# Patient Record
Sex: Female | Born: 1955
Health system: Southern US, Community
[De-identification: ages and names within clinical notes are randomized; demographics above are authoritative.]

## PROBLEM LIST (undated history)

## (undated) DIAGNOSIS — K219 Gastro-esophageal reflux disease without esophagitis: Secondary | ICD-10-CM

## (undated) DIAGNOSIS — G4733 Obstructive sleep apnea (adult) (pediatric): Secondary | ICD-10-CM

## (undated) DIAGNOSIS — K5790 Diverticulosis of intestine, part unspecified, without perforation or abscess without bleeding: Secondary | ICD-10-CM

## (undated) DIAGNOSIS — R112 Nausea with vomiting, unspecified: Secondary | ICD-10-CM

## (undated) DIAGNOSIS — I2510 Atherosclerotic heart disease of native coronary artery without angina pectoris: Secondary | ICD-10-CM

## (undated) DIAGNOSIS — Z9889 Other specified postprocedural states: Secondary | ICD-10-CM

## (undated) DIAGNOSIS — E785 Hyperlipidemia, unspecified: Secondary | ICD-10-CM

## (undated) DIAGNOSIS — I219 Acute myocardial infarction, unspecified: Secondary | ICD-10-CM

## (undated) DIAGNOSIS — M75110 Incomplete rotator cuff tear or rupture of unspecified shoulder, not specified as traumatic: Secondary | ICD-10-CM

## (undated) DIAGNOSIS — I739 Peripheral vascular disease, unspecified: Secondary | ICD-10-CM

## (undated) DIAGNOSIS — Z972 Presence of dental prosthetic device (complete) (partial): Secondary | ICD-10-CM

## (undated) DIAGNOSIS — C801 Malignant (primary) neoplasm, unspecified: Secondary | ICD-10-CM

## (undated) DIAGNOSIS — E7800 Pure hypercholesterolemia, unspecified: Secondary | ICD-10-CM

## (undated) DIAGNOSIS — R011 Cardiac murmur, unspecified: Secondary | ICD-10-CM

## (undated) DIAGNOSIS — E119 Type 2 diabetes mellitus without complications: Secondary | ICD-10-CM

## (undated) DIAGNOSIS — M1990 Unspecified osteoarthritis, unspecified site: Secondary | ICD-10-CM

## (undated) DIAGNOSIS — I350 Nonrheumatic aortic (valve) stenosis: Secondary | ICD-10-CM

## (undated) DIAGNOSIS — J45909 Unspecified asthma, uncomplicated: Secondary | ICD-10-CM

## (undated) DIAGNOSIS — I4819 Other persistent atrial fibrillation: Secondary | ICD-10-CM

## (undated) DIAGNOSIS — I499 Cardiac arrhythmia, unspecified: Secondary | ICD-10-CM

## (undated) DIAGNOSIS — M810 Age-related osteoporosis without current pathological fracture: Secondary | ICD-10-CM

## (undated) DIAGNOSIS — E669 Obesity, unspecified: Secondary | ICD-10-CM

## (undated) DIAGNOSIS — Z973 Presence of spectacles and contact lenses: Secondary | ICD-10-CM

## (undated) DIAGNOSIS — R609 Edema, unspecified: Secondary | ICD-10-CM

## (undated) DIAGNOSIS — I10 Essential (primary) hypertension: Secondary | ICD-10-CM

## (undated) HISTORY — DX: Obesity, unspecified: E66.9

## (undated) HISTORY — DX: Incomplete rotator cuff tear or rupture of unspecified shoulder, not specified as traumatic: M75.110

## (undated) HISTORY — DX: Other persistent atrial fibrillation: I48.19

## (undated) HISTORY — PX: CHOLECYSTECTOMY: SHX55

## (undated) HISTORY — DX: Gastro-esophageal reflux disease without esophagitis: K21.9

## (undated) HISTORY — DX: Malignant (primary) neoplasm, unspecified: C80.1

## (undated) HISTORY — PX: APPENDECTOMY: SHX54

## (undated) MED FILL — Ferumoxytol Inj 510 MG/17ML (30 MG/ML) (Elemental Fe): INTRAVENOUS | Qty: 17 | Status: AC

---
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
## 1996-02-12 HISTORY — PX: NECK SURGERY: SHX720

## 1997-02-11 HISTORY — PX: SHOULDER SURGERY: SHX246

## 1998-11-05 ENCOUNTER — Encounter: Payer: Self-pay | Admitting: Neurosurgery

## 1998-11-05 ENCOUNTER — Ambulatory Visit (HOSPITAL_COMMUNITY): Admission: RE | Admit: 1998-11-05 | Discharge: 1998-11-05 | Payer: Self-pay | Admitting: Neurosurgery

## 1999-02-12 HISTORY — PX: CARPAL TUNNEL RELEASE: SHX101

## 2000-01-07 ENCOUNTER — Encounter: Admission: RE | Admit: 2000-01-07 | Discharge: 2000-01-07 | Payer: Self-pay | Admitting: Neurosurgery

## 2000-01-07 ENCOUNTER — Encounter: Payer: Self-pay | Admitting: Neurosurgery

## 2000-01-10 ENCOUNTER — Ambulatory Visit (HOSPITAL_BASED_OUTPATIENT_CLINIC_OR_DEPARTMENT_OTHER): Admission: RE | Admit: 2000-01-10 | Discharge: 2000-01-10 | Payer: Self-pay | Admitting: Orthopedic Surgery

## 2000-01-31 ENCOUNTER — Ambulatory Visit (HOSPITAL_BASED_OUTPATIENT_CLINIC_OR_DEPARTMENT_OTHER): Admission: RE | Admit: 2000-01-31 | Discharge: 2000-01-31 | Payer: Self-pay | Admitting: Orthopedic Surgery

## 2000-02-12 HISTORY — PX: ERCP: SHX60

## 2000-06-17 ENCOUNTER — Encounter: Payer: Self-pay | Admitting: Otolaryngology

## 2000-06-17 ENCOUNTER — Ambulatory Visit (HOSPITAL_COMMUNITY): Admission: RE | Admit: 2000-06-17 | Discharge: 2000-06-17 | Payer: Self-pay | Admitting: Otolaryngology

## 2000-12-22 ENCOUNTER — Ambulatory Visit (HOSPITAL_COMMUNITY): Admission: RE | Admit: 2000-12-22 | Discharge: 2000-12-22 | Payer: Self-pay | Admitting: Internal Medicine

## 2001-01-07 ENCOUNTER — Encounter: Payer: Self-pay | Admitting: Internal Medicine

## 2001-01-07 ENCOUNTER — Ambulatory Visit (HOSPITAL_COMMUNITY): Admission: RE | Admit: 2001-01-07 | Discharge: 2001-01-07 | Payer: Self-pay | Admitting: Internal Medicine

## 2001-01-28 ENCOUNTER — Ambulatory Visit (HOSPITAL_COMMUNITY): Admission: RE | Admit: 2001-01-28 | Discharge: 2001-01-28 | Payer: Self-pay | Admitting: Internal Medicine

## 2001-10-08 ENCOUNTER — Ambulatory Visit (HOSPITAL_COMMUNITY): Admission: RE | Admit: 2001-10-08 | Discharge: 2001-10-08 | Payer: Self-pay | Admitting: Cardiology

## 2002-01-11 ENCOUNTER — Ambulatory Visit (HOSPITAL_COMMUNITY): Admission: RE | Admit: 2002-01-11 | Discharge: 2002-01-11 | Payer: Self-pay | Admitting: Internal Medicine

## 2002-01-11 ENCOUNTER — Encounter: Payer: Self-pay | Admitting: Internal Medicine

## 2002-02-11 HISTORY — PX: GASTRIC BYPASS: SHX52

## 2002-03-24 ENCOUNTER — Encounter: Admission: RE | Admit: 2002-03-24 | Discharge: 2002-06-22 | Payer: Self-pay | Admitting: Surgery

## 2002-03-25 ENCOUNTER — Encounter: Payer: Self-pay | Admitting: Surgery

## 2002-03-25 ENCOUNTER — Encounter: Admission: RE | Admit: 2002-03-25 | Discharge: 2002-03-25 | Payer: Self-pay | Admitting: Surgery

## 2002-06-22 ENCOUNTER — Inpatient Hospital Stay (HOSPITAL_COMMUNITY): Admission: RE | Admit: 2002-06-22 | Discharge: 2002-06-25 | Payer: Self-pay | Admitting: Surgery

## 2002-06-23 ENCOUNTER — Encounter: Payer: Self-pay | Admitting: Surgery

## 2002-07-06 ENCOUNTER — Encounter: Admission: RE | Admit: 2002-07-06 | Discharge: 2002-10-04 | Payer: Self-pay | Admitting: Surgery

## 2002-09-28 ENCOUNTER — Ambulatory Visit (HOSPITAL_COMMUNITY): Admission: RE | Admit: 2002-09-28 | Discharge: 2002-09-28 | Payer: Self-pay | Admitting: Internal Medicine

## 2002-09-28 ENCOUNTER — Encounter: Payer: Self-pay | Admitting: Internal Medicine

## 2002-12-23 ENCOUNTER — Encounter: Admission: RE | Admit: 2002-12-23 | Discharge: 2003-03-23 | Payer: Self-pay | Admitting: Surgery

## 2003-01-13 ENCOUNTER — Ambulatory Visit (HOSPITAL_COMMUNITY): Admission: RE | Admit: 2003-01-13 | Discharge: 2003-01-13 | Payer: Self-pay | Admitting: Internal Medicine

## 2003-03-31 ENCOUNTER — Encounter: Admission: RE | Admit: 2003-03-31 | Discharge: 2003-06-29 | Payer: Self-pay | Admitting: Surgery

## 2003-08-03 ENCOUNTER — Encounter: Admission: RE | Admit: 2003-08-03 | Discharge: 2003-11-01 | Payer: Self-pay | Admitting: Surgery

## 2004-02-22 ENCOUNTER — Ambulatory Visit (HOSPITAL_COMMUNITY): Admission: RE | Admit: 2004-02-22 | Discharge: 2004-02-22 | Payer: Self-pay | Admitting: Internal Medicine

## 2004-07-13 ENCOUNTER — Ambulatory Visit (HOSPITAL_COMMUNITY): Admission: RE | Admit: 2004-07-13 | Discharge: 2004-07-13 | Payer: Self-pay | Admitting: Internal Medicine

## 2005-02-11 HISTORY — PX: COLONOSCOPY: SHX174

## 2005-03-08 ENCOUNTER — Ambulatory Visit (HOSPITAL_COMMUNITY): Admission: RE | Admit: 2005-03-08 | Discharge: 2005-03-08 | Payer: Self-pay | Admitting: Internal Medicine

## 2005-03-08 ENCOUNTER — Encounter: Payer: Self-pay | Admitting: Internal Medicine

## 2005-03-08 ENCOUNTER — Ambulatory Visit: Payer: Self-pay | Admitting: Internal Medicine

## 2005-09-16 ENCOUNTER — Ambulatory Visit (HOSPITAL_COMMUNITY): Admission: RE | Admit: 2005-09-16 | Discharge: 2005-09-16 | Payer: Self-pay | Admitting: Internal Medicine

## 2008-09-01 ENCOUNTER — Emergency Department (HOSPITAL_COMMUNITY): Admission: EM | Admit: 2008-09-01 | Discharge: 2008-09-01 | Payer: Self-pay | Admitting: Emergency Medicine

## 2010-06-29 NOTE — Procedures (Signed)
   NAMEMARELI, ANTUNES                       ACCOUNT NO.:  [id number]   MEDICAL RECORD NO.:  [id number]                   PATIENT TYPE:  OUT   LOCATION:  RAD                                  FACILITY:  [facility]   PHYSICIAN:  Gerrit Friends. Dietrich Pates, M.D. [facility]        DATE OF BIRTH:  Jun 10, 1955   DATE OF PROCEDURE:  10/08/2001  DATE OF DISCHARGE:                                  ECHOCARDIOGRAM   CLINICAL DATA:  A 55 year old woman with hypertension and peripheral edema.   1. Technically adequate echocardiographic study.  2. Slight left atrial enlargement; normal right atrium and right ventricle.  3. Normal aortic, mitral, tricuspid, and pulmonic valves.  4. Normal internal dimension of the left ventricle; normal regional and     global LV systolic function.  5. Normal Doppler study with physiologic tricuspid regurgitation and normal     estimated RV systolic pressure.  6. Normal IVC.                                                      Gerrit Friends. Dietrich Pates, M.D. [facility]    RMR/MEDQ  D:  10/09/2001  T:  10/09/2001  Job:  [id number]

## 2010-06-29 NOTE — Discharge Summary (Signed)
   [name], Amy Tran                       ACCOUNT NO.:  [id number]   MEDICAL RECORD NO.:  [id number]                   PATIENT TYPE:  INP   LOCATION:  0468                                 FACILITY:  [facility]   PHYSICIAN:  Thornton Park. Daphine Deutscher, M.D.             DATE OF BIRTH:  04-18-1955   DATE OF ADMISSION:  06/22/2002  DATE OF DISCHARGE:  06/25/2002                                 DISCHARGE SUMMARY   ADMISSION DIAGNOSIS:  Morbid obesity.   PROCEDURE:  Roux-en-Y gastric bypass (antecolic-split omentum), 40 cm bilo-  enteric limb with 108 cm Roux limb bypass.   COURSE IN THE HOSPITAL:  The patient is a 55 year old morbidly obese lady  who underwent the above-mentioned operation.  She had a swallow done on  postoperative day #1 which showed no evidence of leak.  She had a DVT study  which showed no evidence of deep vein thrombosis.  Her Jackson-Pratt drain  just showed some serosanguineous material.  She was begun on blue jello, and  she tolerated that well and was taking p.o.'s and was ready for discharge on  Jun 25, 2002, postoperative day #3.  She was ready to be followed up in the  office by myself and Osborne Casco in one week.   CONDITION:  Good.   FINAL DIAGNOSIS:  Morbid obesity.                                               Thornton Park Daphine Deutscher, M.D.    MBM/MEDQ  D:  07/06/2002  T:  07/06/2002  Job:  [id number]

## 2010-06-29 NOTE — Op Note (Signed)
[facility]  Patient:    Amy Tran, Amy Tran Visit Number: [id number] MRN: [id number]          Service Type: END Location: DAY Attending Physician:  Jonathon Bellows Dictated by:   Roetta Sessions, M.D. Proc. Date: 12/22/00 Admit Date:  12/22/2000   CC:         Carylon Perches, M.D.   Operative Report  PROCEDURE:  Esophagogastroduodenoscopy and Maloney dilation followed by biopsy, followed by colonoscopy and snare polypectomy.  ENDOSCOPIST:  Roetta Sessions, M.D.  INDICATIONS FOR PROCEDURE:  Patient is a 55 year old Caucasian female referred at the courtesy of Dr. Carylon Perches for further evaluation of esophageal dysphagia.  She also has intermittent hematochezia.  EGD and colonoscopy are now being done.  This approach has been discussed with Ms. Vice previously, potential risks, benefits and alternatives have been reviewed and questions answered.  She understands the potential risks, benefits and alternatives. Please see my handwritten H&P for more information.  The patient is low risk for conscious sedation with Versed and Demerol.  DESCRIPTION OF PROCEDURE:  O2 saturation, blood pressure, pulse and respirations were monitored throughout the entirety of both procedures. Conscious sedation:  IV Versed and Demerol in incremental doses.  INSTRUMENTS:  Olympus video chip gastroscope and colonoscope.  ESOPHAGOGASTRODUODENOSCOPY FINDINGS:  Examination of the tubular esophagus revealed multiple distal esophageal erosions.  There was no obvious ring, stricture or neoplasm.  There was no Barretts esophagus.  EG junction was easily traversed.  Stomach:  The gastric cavity was empty and insufflated well with air. Thorough examination of the gastric mucosa including a retroflexed view of the proximal stomach and esophagogastric junction demonstrated only antral erosions.  Pylorus was patent and easily traversed.  Duodenum:  Bulb and second portion appeared  normal.  THERAPEUTIC/DIAGNOSTIC MANEUVERS PERFORMED:  A 56-French Maloney dilator was passed to full insertion.  A look back into the esophagus revealed no apparent complications related to passage of the dilator.  Subsequently, two antral biopsies for CLOtesting were obtained.  The patient tolerated the procedure well and was prepared for colonoscopy. Digital rectal examination revealed no abnormalities.  ENDOSCOPIC FINDINGS:  The prep was adequate.  Rectum:  Examination of the rectal mucosa including a retroflexed view of the anal verge revealed only some internal hemorrhoids.  Colon:  Colonic mucosa was surveyed from the rectosigmoid junction through the left, transverse and right colon to the area of the appendiceal orifice, ileocecal valve and cecum.  Patient was noted to have a 5-mm polyp on a stalk at 25 cm.  Remainder of the colonic mucosa appeared normal.  From the level of the cecum and ileocecal valve and appendiceal orifice (see photos), the scope was slowly withdrawn and all previously mentioned mucosal surfaces were again seen.  No other abnormalities were observed.  The polyp at 25 cm was resected and recovered with snare cautery.  Patient tolerated both procedure well and was reactive at endoscopy.  IMPRESSION: Esophagogastroduodenoscopy: 1. Distal esophageal erosions consistent with mild erosive reflux esophagitis. 2. Antral erosions of uncertain significance, status post CLOtesting. 3. Remainder of upper gastrointestinal tract appeared normal, status post    passage of a 56-French Maloney dilator prior to biopsy.  Colonoscopy findings: 1. Internal hemorrhoids, otherwise, normal rectum. 2. Small 5-mm polyp on a stalk at 25 cm, resected. 3. Remainder of colonic mucosa appeared normal.  I suspect that she has bleed from hemorrhoids.  RECOMMENDATIONS: 1. Hemorrhoid literature given to Ms. Gugel. 2. Tentative course of Anusol-HC suppositories, one per  rectum at  bedtime. 3. No aspirin or arthritis medications for 10 days. 4. She is to go by my office to get some Aciphex.  I have asked her to start    taking Aciphex 20 mg orally daily 30 minutes before breakfast. 5. Will follow up on the pathology and make further recommendations. Dictated by:   Roetta Sessions, M.D. Attending Physician:  Jonathon Bellows DD:  12/22/00 TD:  12/23/00 Job: [id number]

## 2010-06-29 NOTE — Op Note (Signed)
NAMEDANE, BLOCH                       ACCOUNT NO.:  [id number]   MEDICAL RECORD NO.:  [id number]                   PATIENT TYPE:  INP   LOCATION:  0468                                 FACILITY:  [facility]   PHYSICIAN:  Thornton Park. Daphine Deutscher, M.D.             DATE OF BIRTH:  06-Feb-1956   DATE OF PROCEDURE:  06/22/2002  DATE OF DISCHARGE:                                 OPERATIVE REPORT   PREOPERATIVE DIAGNOSIS:  Morbid obesity with hypertension, arthritis and  borderline diabetes.   POSTOPERATIVE DIAGNOSIS:  Morbid obesity with hypertension, arthritis and  borderline diabetes and body mass index of 50.3.   OPERATION/PROCEDURE:  Roux-en-Y gastric bypass (antecolic Roux splitting the  omentum with a Roux length of 108 cm and biloenteric length of 40 cm from  the ligament of Treitz).   SURGEON:  Thornton Park. Daphine Deutscher, M.D.   ASSISTANT:  Sharlet Salina T. Hoxworth, M.D.   ANESTHESIA:  General endotracheal anesthesia.   DRAINS:  One Jackson-Pratt drain in the upper abdomen.   DESCRIPTION OF PROCEDURE:  Josslynn Mentzer is a 55 year old lady who  underwent preoperative evaluation and informed consent regarding gastric  bypass surgery.  She was brought to Room #1 and given general anesthesia.  The abdomen was marked with ribs and xiphoid and using the Endopath device,  a 0-degree scope was used to gain access into the left upper quadrant.  The  11/12 was used in this location.  The patient's abdomen was insufflated and  I found her to have omentum stuck up to the anterior abdominal wall from her  previous open cholecystectomy.  Another camera port was placed below and to  the right of the umbilicus.  With that in place, another port was placed in  the upper abdomen.  But before doing that, I used the right lower abdominal  11/22/10 port to insert the Harmonic scalpel and I harmonically scalpel  divided all the adhesions to the anterior abdominal wall.  Two ports were  placed in the upper  abdomen and two along the left side.  We began the  jejunojejunostomy portion of the operation.  Initially the omentum was grasped along the transverse colon and this was  elevated.  I walked the small bowel back and identified the ligament of  Treitz.  I then marched 40 cm distal to this and divided the small intestine  with a single application of the Ethicon endo-GIA using a vascular  cartridge.  These harmonicked to increase the size of this mesenteric  defect.  I then marked the Roux limb with a Penrose drain and keeping that  to the left upper quadrant, I then marched, distal to that point, a total of  108 cm and at that point sutured the end of the bilopancreatic limb to the  Roux limb.  The antimesenteric borders were scored with the Harmonic and the  endo-GIA was inserted and fired.  Anastomosis was created.  The common  defect was closed from both ends using running 2-0 Vicryl suture.  There was  one area that I did not tuck in toward the proximal end of the closure and I  oversewed that with a simple Lembert type suture, tying this down.  When  closure had been completed, I used some Tisseel to seal that anastomosis.  The mesenteric defect was closed with a running 2-0 silk.  That was done  after we eventually pulled the Roux up to make sure of how it was going to  be lying.  With the Roux limb going up, I felt I may have to divide the  omentum. We went ahead and placed up on the omentum to the stomach.  Next, a Nathanson retractor was inserted through a separate 5 mm in the  midline and the liver was elevated.  I worked proximally with some  difficulty, because she was quite high and obese, to dissect along the  cardia of the stomach and along the left crus.  I then went to the second  vessel along the lesser curvature, approximately 4 cm from the  esophagogastric junction, and divided the stomach, first with a horizontal  firing of the Auto-Suture stapling device.  This was  done after all the  tubes were removed.  The 40 cm was applied horizontally first and then three  applications of the 60, the first two getting up very close to the edge and  then the last to divide the last little tip.  This was done with the Ewald  tube in place and moving it around to make sure it did not get impinged.  At  that point I did apply some Tisseel to that area of the closure posteriorly.  We then went down and divided the omentum where the antecolic Roux would be  laying. I took it off a portion of the transverse colon and it kind of fell  away.  I was then able to bring it up and suture it to the second staple  line of the stomach, suturing with a running 2-0 Vicryl.  I started a second  one below and sutured it up to itself.  I actually created somewhat of a  prominent candy cane with the candy cane pointed to the patient's right.  We  had checked this and this was not twisted and seemed to be the way it would  lie.  I then opened along the antimesenteric border of the small bowel and  of the pouch with the Ewald tube in place using the Harmonic scalpel.  The  Ethicon stapler was inserted through this and fired and this created the  anastomosis.  Common defect was closed with two sutures of 2-0 Vicryl from  either staple line suturing this closed and tying in the middle.  The Ewald  tube was then advanced across the anastomosis and then the anterior row was  closed, tying first to the corner stitch to the patient's left and suturing  from left to right with a running Connell type suture which inverted the  staple line and created a second layer to the anastomosis.  Toward the edge  it was tied to the stitch at the right corner.  Dr. Johna Sheriff then performed endoscopy.  We visualized the anastomosis in  both limbs and a picture was taken.  Tisseel was then applied to the  anterior anastomosis.  I also took off a little excess of the candy cane to prevent  a little pouch from  being created.  Everything looked healthy and  viable.  When he endoscoped her, I clamped off the bowel and insufflated and  there was no evidence of leak.  This was done under saline.  The port sites  all looked good.  They were all placed obliquely.  We surveyed the bowel and  the rest of the abdomen.  No other abnormalities were noted.  The drain was  brought in through the left-sided 5 mm port and secured to the skin with a  nylon.  This was placed up underneath the liver.  The Nathanson retractor  was then withdrawn and the abdomen was deflated and the trocars were  removed.  The incisions were closed with 4-0 Vicryl, with staples also.  The  patient seemed to tolerate the procedure well and was taken to the recovery  room in satisfactory condition.   FINAL DIAGNOSIS:  Morbid obesity, body mass index slightly greater than 50,  status post Roux-en-Y gastric bypass.                                               Thornton Park Daphine Deutscher, M.D.    MBM/MEDQ  D:  06/22/2002  T:  06/23/2002  Job:  [id number]   cc:   Kingsley Callander. Ouida Sills, M.D.  [address]  [place]  [place] [address]  Fax: [phone]

## 2010-06-29 NOTE — Op Note (Signed)
[facility]  Patient:    Amy Tran, Amy Tran                    MRN: [id number] Proc. Date: 01/31/00 Adm. Date:  [id number] Attending:  Colbert Ewing                           Operative Report  PREOPERATIVE DIAGNOSIS:  Left carpal tunnel syndrome.  POSTOPERATIVE DIAGNOSIS:  Left carpal tunnel syndrome.  OPERATION PERFORMED:  Left carpal tunnel release.  SURGEON:  Loreta Ave, M.D.  ASSISTANT:  Arlys John D. Petrarca, P.A.-C.  ANESTHESIA:  IV regional.  SPECIMENS:  None.  COMPLICATIONS:  None.  CULTURES:  None.  DRESSING:  Soft compressive with bulky hand dressing and splint.  DESCRIPTION OF PROCEDURE:  The patient was brought to the operating room and placed on the operating table in the supine position.  After adequate anesthesia had been obtained, the left arm was prepped and draped in the usual sterile fashion.  A curved incision along the thenar eminence heading slightly ulnarward at the distal wrist crease.  Skin and subcutaneous tissues divided avoiding injury to the palmar branch median nerve.  Flexor retinaculum over the carpal tunnel exposed and incised in its entirety from the forearm fascia proximally to the palmar arch distally.  Moderate to marked hourglass distally.  Moderate to marked hourglass constriction and erythema of the median nerve right in the middle of the carpal canal.  Completely decompressed.  No other abnormalities in the carpal tunnel.  Motor branches, digital branches identified, protected and decompressed.  Wound irrigated. Skin closed with nylon.  Margins of the wound injected with Marcaine.  Sterile compressive dressing and bulky hand dressing and splint applied.  Anesthesia reversed.  Brought to recovery room.  Tolerated surgery well.  No complications. DD:  01/31/00 TD:  02/01/00 Job: [id number]

## 2010-06-29 NOTE — Op Note (Signed)
   [name], [name]                       ACCOUNT NO.:  [id number]   MEDICAL RECORD NO.:  [id number]                   PATIENT TYPE:  INP   LOCATION:  0468                                 FACILITY:  [facility]   PHYSICIAN:  Sharlet Salina T. Hoxworth, M.D.          DATE OF BIRTH:  11-Dec-1955   DATE OF PROCEDURE:  06/22/2002  DATE OF DISCHARGE:                                 OPERATIVE REPORT   PROCEDURE:  Intraoperative upper endoscopy.   DESCRIPTION OF PROCEDURE:  Amy Tran is endoscoped intraoperatively  following completion of her laparoscopic roux-Y gastric bypass. The  endoscope was introduced into the pharynx and passed under direct vision  into the esophagus. The esophagus was normal to the EG junction. The gastric  pouch was entered. There was no bleeding. The anastomosis was visualized and  measured about 2 cm in diameter. Staple lines were intact. The gastric pouch  was insufflated to test for leaks and none seen. The pouch measured  approximately 5 cm in length. The air was then suctioned and the scope  withdrawn.                                               Lorne Skeens. Hoxworth, M.D.    [name]  D:  06/22/2002  T:  06/23/2002  Job:  [id number]

## 2010-06-29 NOTE — Op Note (Signed)
Amy Tran, Amy Tran             ACCOUNT NO.:  [id number]   MEDICAL RECORD NO.:  [id number]          PATIENT TYPE:  AMB   LOCATION:  DAY                           FACILITY:  [facility]   PHYSICIAN:  R. Roetta Sessions, M.D. DATE OF BIRTH:  November 01, 1955   DATE OF PROCEDURE:  03/08/2005  DATE OF DISCHARGE:                                 OPERATIVE REPORT   PROCEDURE:  Colonoscopy with biopsy.   INDICATIONS FOR PROCEDURE:  The patient is a 55 year old Caucasian female  who was found to have colonic adenoma back in 2002. It was removed. She is  here for surveillance. She has no GI symptoms currently, and there is no  family history of colorectal neoplasia. Colonoscopy is now being done as a  surveillance maneuver. This approach has been discussed with the patient at  length. Potential risks, benefits, and alternatives have been reviewed and  questions answered. She is agreeable. Please see documentation in the  medical record.   PROCEDURE NOTE:  O2 saturation, blood pressure, pulse, and respirations were  monitored throughout the entire procedure. Conscious sedation with Versed 3  mg IV and Demerol 75 mg IV in divided doses.   INSTRUMENT:  Olympus video chip system.   FINDINGS:  Digital rectal exam revealed no abnormalities.   ENDOSCOPIC FINDINGS:  Prep was good.   Rectum:  Examination of the rectal mucosa including retroflexed view of the  anal verge revealed only minimal internal hemorrhoids and a diminutive 5-mm  polyp at 10 cm in from the anal verge.   Colon:  Colonic mucosa was surveyed from the rectosigmoid junction through  the left, transverse, and right colon to the area of the appendiceal  orifice, ileocecal valve, and cecum. These structures were well seen and  photographed for the record. From this level, the scope was slowly and  cautiously withdrawn, and all previously mentioned mucosal surfaces were  again seen. There was a diminutive 5-mm polyp at the splenic flexure,  and  she had swallow left-sided diverticula. The remainder of the colonic mucosa  appeared normal. The polyp at the splenic flexure was cold biopsied/removed  as was the polyp in the rectum. The patient tolerated the procedure well and  was reactive to endoscopy. Cecal withdraw time 12 minutes.   IMPRESSION:  1.  Minimal internal hemorrhoids. Diminutive rectal polyp at 10 cm, cold      biopsied/removed. The remainder of the rectal mucosa appeared normal.  2.  Swallow left-sided diverticula. Diminutive polyp at the splenic flexure,      cold biopsied/removed. The remainder of the colonic mucosa appeared      normal.   RECOMMENDATIONS:  1.  Diverticulosis literature given to Ms. Panozzo.  2.  Follow up on pathology.  3.  Further recommendations to follow.      Jonathon Bellows, M.D.  Electronically Signed     RMR/MEDQ  D:  03/08/2005  T:  03/08/2005  Job:  [id number]   cc:   Kingsley Callander. Ouida Sills, MD  Fax: [phone]

## 2012-07-07 ENCOUNTER — Other Ambulatory Visit (HOSPITAL_COMMUNITY): Payer: Self-pay | Admitting: Internal Medicine

## 2012-07-07 DIAGNOSIS — Z139 Encounter for screening, unspecified: Secondary | ICD-10-CM

## 2012-07-13 ENCOUNTER — Ambulatory Visit (HOSPITAL_COMMUNITY)
Admission: RE | Admit: 2012-07-13 | Discharge: 2012-07-13 | Disposition: A | Discharge status: Home / Self Care | Payer: Medicare Other | Source: Ambulatory Visit | Attending: Internal Medicine | Admitting: Internal Medicine

## 2012-07-13 DIAGNOSIS — Z139 Encounter for screening, unspecified: Secondary | ICD-10-CM

## 2012-07-13 DIAGNOSIS — Z1231 Encounter for screening mammogram for malignant neoplasm of breast: Secondary | ICD-10-CM | POA: Insufficient documentation

## 2012-09-17 ENCOUNTER — Telehealth (HOSPITAL_COMMUNITY): Payer: Self-pay | Admitting: Dietician

## 2012-09-17 NOTE — Telephone Encounter (Signed)
Received referral via fax from Dr. Ouida Sills. Dx: diabetes.

## 2012-09-17 NOTE — Telephone Encounter (Signed)
Called at 1409. Left message on voicemail.

## 2012-09-23 NOTE — Telephone Encounter (Signed)
No response to previous contact attempt. Sent letter to pt home via [employer] Mail in attempt to contact pt to schedule appointment.  

## 2012-09-29 NOTE — Telephone Encounter (Signed)
No response to previous contact attempts. Referral filed.  

## 2012-11-10 DIAGNOSIS — M5136 Other intervertebral disc degeneration, lumbar region: Secondary | ICD-10-CM | POA: Insufficient documentation

## 2012-11-10 DIAGNOSIS — M47816 Spondylosis without myelopathy or radiculopathy, lumbar region: Secondary | ICD-10-CM | POA: Insufficient documentation

## 2012-11-10 DIAGNOSIS — R29818 Other symptoms and signs involving the nervous system: Secondary | ICD-10-CM | POA: Insufficient documentation

## 2012-11-18 ENCOUNTER — Other Ambulatory Visit (HOSPITAL_COMMUNITY): Payer: Self-pay | Admitting: Neurosurgery

## 2012-11-18 DIAGNOSIS — IMO0002 Reserved for concepts with insufficient information to code with codable children: Secondary | ICD-10-CM

## 2012-11-20 ENCOUNTER — Other Ambulatory Visit (HOSPITAL_COMMUNITY): Payer: Medicare Other

## 2012-11-20 ENCOUNTER — Ambulatory Visit (HOSPITAL_COMMUNITY)
Admission: RE | Admit: 2012-11-20 | Discharge: 2012-11-20 | Disposition: A | Discharge status: Home / Self Care | Payer: Medicare Other | Source: Ambulatory Visit | Attending: Neurosurgery | Admitting: Neurosurgery

## 2012-11-20 DIAGNOSIS — IMO0001 Reserved for inherently not codable concepts without codable children: Secondary | ICD-10-CM | POA: Insufficient documentation

## 2012-11-20 DIAGNOSIS — D1800 Hemangioma unspecified site: Secondary | ICD-10-CM | POA: Insufficient documentation

## 2012-11-20 DIAGNOSIS — G8929 Other chronic pain: Secondary | ICD-10-CM | POA: Insufficient documentation

## 2012-11-20 DIAGNOSIS — M5126 Other intervertebral disc displacement, lumbar region: Secondary | ICD-10-CM | POA: Insufficient documentation

## 2012-11-20 DIAGNOSIS — IMO0002 Reserved for concepts with insufficient information to code with codable children: Secondary | ICD-10-CM

## 2012-11-20 DIAGNOSIS — M47817 Spondylosis without myelopathy or radiculopathy, lumbosacral region: Secondary | ICD-10-CM | POA: Insufficient documentation

## 2012-11-25 DIAGNOSIS — M5126 Other intervertebral disc displacement, lumbar region: Secondary | ICD-10-CM | POA: Insufficient documentation

## 2013-01-01 DIAGNOSIS — M47817 Spondylosis without myelopathy or radiculopathy, lumbosacral region: Secondary | ICD-10-CM | POA: Insufficient documentation

## 2013-04-04 ENCOUNTER — Emergency Department (HOSPITAL_COMMUNITY): Payer: Medicare Other

## 2013-04-04 ENCOUNTER — Emergency Department (HOSPITAL_COMMUNITY)
Admission: EM | Admit: 2013-04-04 | Discharge: 2013-04-04 | Disposition: A | Discharge status: Home / Self Care | Payer: Medicare Other | Attending: Emergency Medicine | Admitting: Emergency Medicine

## 2013-04-04 ENCOUNTER — Encounter (HOSPITAL_COMMUNITY): Payer: Self-pay | Admitting: Emergency Medicine

## 2013-04-04 DIAGNOSIS — Z79899 Other long term (current) drug therapy: Secondary | ICD-10-CM | POA: Insufficient documentation

## 2013-04-04 DIAGNOSIS — I10 Essential (primary) hypertension: Secondary | ICD-10-CM | POA: Insufficient documentation

## 2013-04-04 DIAGNOSIS — Z880 Allergy status to penicillin: Secondary | ICD-10-CM | POA: Insufficient documentation

## 2013-04-04 DIAGNOSIS — J029 Acute pharyngitis, unspecified: Secondary | ICD-10-CM | POA: Insufficient documentation

## 2013-04-04 DIAGNOSIS — J36 Peritonsillar abscess: Secondary | ICD-10-CM | POA: Insufficient documentation

## 2013-04-04 DIAGNOSIS — E119 Type 2 diabetes mellitus without complications: Secondary | ICD-10-CM | POA: Insufficient documentation

## 2013-04-04 HISTORY — DX: Essential (primary) hypertension: I10

## 2013-04-04 HISTORY — DX: Edema, unspecified: R60.9

## 2013-04-04 HISTORY — DX: Type 2 diabetes mellitus without complications: E11.9

## 2013-04-04 LAB — CBC WITH DIFFERENTIAL/PLATELET
BASOS ABS: 0 10*3/uL (ref 0.0–0.1)
BASOS PCT: 0 % (ref 0–1)
EOS PCT: 1 % (ref 0–5)
Eosinophils Absolute: 0.1 10*3/uL (ref 0.0–0.7)
HEMATOCRIT: 40.2 % (ref 36.0–46.0)
HEMOGLOBIN: 13.5 g/dL (ref 12.0–15.0)
Lymphocytes Relative: 10 % — ABNORMAL LOW (ref 12–46)
Lymphs Abs: 1.5 10*3/uL (ref 0.7–4.0)
MCH: 28.8 pg (ref 26.0–34.0)
MCHC: 33.6 g/dL (ref 30.0–36.0)
MCV: 85.7 fL (ref 78.0–100.0)
MONO ABS: 1 10*3/uL (ref 0.1–1.0)
MONOS PCT: 7 % (ref 3–12)
Neutro Abs: 13 10*3/uL — ABNORMAL HIGH (ref 1.7–7.7)
Neutrophils Relative %: 83 % — ABNORMAL HIGH (ref 43–77)
Platelets: 297 10*3/uL (ref 150–400)
RBC: 4.69 MIL/uL (ref 3.87–5.11)
RDW: 14 % (ref 11.5–15.5)
WBC: 15.6 10*3/uL — ABNORMAL HIGH (ref 4.0–10.5)

## 2013-04-04 LAB — BASIC METABOLIC PANEL
BUN: 16 mg/dL (ref 6–23)
CALCIUM: 9.9 mg/dL (ref 8.4–10.5)
CHLORIDE: 92 meq/L — AB (ref 96–112)
CO2: 32 mEq/L (ref 19–32)
CREATININE: 0.7 mg/dL (ref 0.50–1.10)
GFR calc non Af Amer: >90 mL/min (ref >90)
Glucose, Bld: 116 mg/dL — ABNORMAL HIGH (ref 70–99)
Potassium: 3.2 mEq/L — ABNORMAL LOW (ref 3.7–5.3)
Sodium: 136 mEq/L — ABNORMAL LOW (ref 137–147)

## 2013-04-04 LAB — RAPID STREP SCREEN (MED CTR MEBANE ONLY): Streptococcus, Group A Screen (Direct): NEGATIVE

## 2013-04-04 MED ORDER — HYDROCODONE-ACETAMINOPHEN 7.5-325 MG/15ML PO SOLN: 10.0000 mL | Freq: Four times a day (QID) | ORAL | Status: DC | PRN

## 2013-04-04 MED ORDER — DEXAMETHASONE SODIUM PHOSPHATE 4 MG/ML IJ SOLN
10.0000 mg | Freq: Once | INTRAMUSCULAR | Status: AC
  Administered 2013-04-04: 10 mg via INTRAVENOUS
  Filled 2013-04-04: qty 3

## 2013-04-04 MED ORDER — IOHEXOL 300 MG/ML  SOLN
75.0000 mL | Freq: Once | INTRAMUSCULAR | Status: AC | PRN
  Administered 2013-04-04: 75 mL via INTRAVENOUS

## 2013-04-04 MED ORDER — SODIUM CHLORIDE 0.9 % IV SOLN
INTRAVENOUS | Status: DC
  Administered 2013-04-04: 15:00:00 via INTRAVENOUS

## 2013-04-04 MED ORDER — CLINDAMYCIN PALMITATE HCL 75 MG/5ML PO SOLR: 450.0000 mg | Freq: Three times a day (TID) | ORAL | Status: DC

## 2013-04-04 MED ORDER — CLINDAMYCIN PHOSPHATE 900 MG/50ML IV SOLN
900.0000 mg | Freq: Once | INTRAVENOUS | Status: AC
  Administered 2013-04-04: 900 mg via INTRAVENOUS
  Filled 2013-04-04: qty 50

## 2013-04-04 NOTE — Discharge Instructions (Signed)
°Emergency Department Resource Guide °1) Find a Doctor and Pay Out of Pocket °Although you won't have to find out who is covered by your insurance plan, it is a good idea to ask around and get recommendations. You will then need to call the office and see if the doctor you have chosen will accept you as a new patient and what types of options they offer for patients who are self-pay. Some doctors offer discounts or will set up payment plans for their patients who do not have insurance, but you will need to ask so you aren't surprised when you get to your appointment. ° °2) Contact Your Local Health Department °Not all health departments have doctors that can see patients for sick visits, but many do, so it is worth a call to see if yours does. If you don't know where your local health department is, you can check in your phone book. The CDC also has a tool to help you locate your state's health department, and many state websites also have listings of all of their local health departments. ° °3) Find a Walk-in Clinic °If your illness is not likely to be very severe or complicated, you may want to try a walk in clinic. These are popping up all over the country in pharmacies, drugstores, and shopping centers. They're usually staffed by nurse practitioners or physician assistants that have been trained to treat common illnesses and complaints. They're usually fairly quick and inexpensive. However, if you have serious medical issues or chronic medical problems, these are probably not your best option. ° °No Primary Care Doctor: °- Call [facility] at  832-8000 - they can help you locate a primary care doctor that  accepts your insurance, provides certain services, etc. °- Physician Referral Service- 1-800-533-3463 ° °Chronic Pain Problems: °Organization         Address  Phone   Notes  °[facility]  (336) 297-2271 Patients need to be referred by their primary care doctor.  ° °Medication  Assistance: °Organization         Address  Phone   Notes  °[place] [facility] [address] °[place], [place] [address] (336) 641-8030 --Must be a resident of [place] °-- Must have NO insurance coverage whatsoever (no Medicaid/ Medicare, etc.) °-- The pt. MUST have a primary care doctor that directs their care regularly and follows them in the community °  °[facility]  (866) 331-1348   °[employer]  (888) 892-1162   ° °Agencies that provide inexpensive medical care: °Organization         Address  Phone   Notes  °[facility]  (336) 832-8035   °[facility]    (336) 832-7272   °[facility] [address] °[place], [place] [address] (336) 832-4777   °[facility] [address], °[place] (336) 271-4999   °[facility]    (336) 373-0678   °[facility]    (336) 272-1050   °[facility] ° [address], [place] Phone:  (336) 832-4444, Fax:  (336) 832-4440 Hours of Operation:  9 am - 6 pm, M-F.  Also accepts Medicaid/Medicare and self-pay.  °[facility] ° [address], [place] Phone: (336) [phone], Fax: (336) [phone]. Hours of Operation:  8:30 am - 5:30 pm, M-F.  Also accepts Medicaid and self-pay.  °[facility] [place] [address], [place] Phone: (336) 878-6027   °[facility] [address], [place], [place] (336)723-1848, Ext. 123 Mondays & Thursdays: 7-9 AM.  First 15 patients are seen on a first come, first serve basis. °  ° °Medicaid-accepting [place] Providers: ° °Organization         Address  Phone   Notes  °[facility] [address], [place] (336) 641-2100 Also accepts self-pay patients.  °[facility] [address], [place] ° (336) 856-9996   °[facility] [address], [place]  (336) 288-8857   °[facility] [address] [place] [address], [place] (336) 299-7000   °Veita Bland [address], [place]  ° (336) 373-1557 Only accepts [place] Access Medicaid patients after they have their name applied to their card.  ° °Self-Pay (no insurance) in [place]: ° °Organization         Address  Phone   Notes  °Sickle Cell Patients, [facility] [address], [place] (336) 832-1970   °[facility] [address], [place] (336) 832-4400   °[facility] ° [address], [place] (336) 992-4800   °[facility]/Dr. Osei-Bonsu ° [address] [place] [address], [place] or [address], [place] (336) [phone] Phone number for both [place] and [place] locations is the same.  °[facility] [address], [place] (336) 299-0000   °[facility] [address] [place] [address], [place] or [address] (336) 852-7530 °(336) 878-2260   °[facility] [address], [place] (336) 350-1642, phone; (336) 294-5005, fax Sees patients 1st and 3rd Saturday of every month.  Must not qualify for public or private insurance (i.e. Medicaid, Medicare, [place] Health Choice, Veterans' Benefits) • Household income should be no more than 200% of the poverty level •The clinic cannot treat you if you are pregnant or think you are pregnant • Sexually transmitted diseases are not treated at the clinic.  ° ° °Dental Care: °Organization         Address  Phone  Notes  °[place] [facility] [address], [place] (336) 641-6152 Accepts children up to age 21 who are enrolled in Medicaid or [place] Health Choice; pregnant women with a Medicaid card; and children who have applied for Medicaid or [place] Health Choice, but were declined, whose parents can pay a reduced fee at time of service.  °[place]  [facility] [place]  [address], [place] (336) 641-7733 Accepts children up to age 21 who are enrolled in Medicaid or [place] Health Choice; pregnant women with a Medicaid card; and children who have applied for Medicaid or  Health Choice, but were declined, whose parents can pay a reduced fee at time of service.  °[facility] ° [address], [place] (336) 641-4533 Patients are seen by appointment only. Walk-ins are not accepted. [facility] will see patients 18 years of age and older. °Monday - Tuesday (8am-5pm) °Most Wednesdays (8:30-5pm) °$30 per visit, cash only  °[facility] ° [address], [place] (336) [phone] Patients are seen by appointment only. Walk-ins are not accepted. [facility] will see patients 18 years of age and older. °One   Wednesday Evening (Monthly: Volunteer Based).  $30 per visit, cash only  °[facility]  (919) [phone] for adults; Children under age 4, call Graduate Pediatric Dentistry at ([phone]) [phone]. Children aged 4-14, please call ([phone]) [phone] to request a pediatric application. ° Dental services are provided in all areas of dental care including fillings, crowns and bridges, complete and partial dentures, implants, gum treatment, root canals, and extractions. Preventive care is also provided. Treatment is provided to both adults and children. °Patients are selected via a lottery and there is often a waiting list. °  °[facility] [address], °[place] ° (336) 763-8833 [email or web address] °  °[facility] [address], [place], [place] (336)723-1848, Ext. 123 Second and Fourth Thursday of each month, opens at 6:30 AM; Clinic ends at 9 AM.  Patients are seen on a first-come first-served basis, and a limited number are seen during each clinic.  ° °[facility] ° [address], [place], [place] (336) [phone]    Eligibility Requirements °You must have lived in [place], [place], or [place] counties for at least the last three months. °  You cannot be eligible for state or federal sponsored healthcare insurance, including Veterans Administration, Medicaid, or Medicare. °  You generally cannot be eligible for healthcare insurance through your employer.  °  How to apply: °Eligibility screenings are held every Tuesday and Wednesday afternoon from 1:00 pm until 4:00 pm. You do not need an appointment for the interview!  °[facility] [address], [place], [place] 336-631-2330   °[facility]  336-342-8273   °[facility]  336-703-3100   °[facility]  336-570-6415   ° °Behavioral Health Resources in the Community: °Intensive Outpatient Programs °Organization         Address  Phone  Notes  °[facility] [address], [place], [place] 336-878-6098   °[facility] [address], [place], [place] 336-832-9800   °[facility] [address], [place], [place] ° 336-882-2125   °[facility] [address],  °[place], [place] 1-800-853-5163 or 336-641-4981   °Substance Abuse Resources °Organization         Address  Phone  Notes  °[facility]  336-882-2125   °[facility]  336-784-9470   °[facility]  336-285-9073   °[facility]  336-845-3988   °Residential & Outpatient Substance Abuse Program  1-800-659-3381   °Psychological Services °Organization         Address  Phone  Notes  °[facility]  336- 832-9600   °[facility]  336- 378-7881   °[facility] [address], [place] 1-800-853-5163 or 336-641-4981   ° °Mobile Crisis Teams °Organization         Address  Phone  Notes  °[facility], Mobile Crisis Care Unit  1-877-626-1772   °Assertive °[facility] ° [address]  [place], [place] 336-834-9664   °Sharon DeEsch [address] °[place] 336-554-5454   ° °Self-Help/Support Groups °Organization         Address  Phone             Notes  °[facility] [place] - variety of support groups  336- 373-1402 Call for more information  °Narcotics Anonymous (NA), [facility] [address], °[place]   2 meetings at this location  ° °  Residential Treatment Programs Organization         Address  Phone  Notes  [facility] [address],    [place]  1-(701) 822-9871   [facility]  [address], [place] [address], [place], [place]   [place], [place] 254-180-8130 Admissions: 8am-3pm M-F  [facility] [address],    [place], [place] [phone]   [facility] [address] [place], [place], [place]   [facility] [address],  [place], [place]   [facility] Intensive Outpatient [address], [place], [place]   [facility] ([place] [address]  [place], [place] 1-332-434-1440 or 3040790098   [facility] [address], [place], [place] Accepts Medicaid  [facility] [address]  [place] 1-(920)749-3699 Substance Abuse/Addiction Treatment   [facility] Organization         Address  Phone  Notes  [facility]  617-876-0287   Domenic Schwab, PhD [address] [place], [place]   224-458-7081 or 989-397-5505   [place], [place] (262)[phone]   [facility] [address], [place], [place] [phone] Insurance/Medicaid/sponsorship through [facility] [address]                                    [place], [place] [phone])[phone] [place] [address], [place] [phone]    Dr. Adele Schilder  [phone]   [facility] Dept. 1) [address], [place] 2) [place] 3)  [place] [address], [place] [phone]  [phone]   [place] ([phone])[phone] or ([phone] (After Hours)       Take the prescriptions as directed. Gargle with warm water several times per day to help with discomfort.  May also use over the counter sore throat pain medicines such as chloraseptic or sucrets, as directed on packaging, as needed for discomfort.  Call the ENT doctor tomorrow morning to schedule a follow up appointment within the next 2 days.  Return to the Emergency Department immediately if worsening.

## 2013-04-04 NOTE — ED Notes (Signed)
Pt reports feels like throat is swelling.  Says is having difficulty swallowing.  Denies any recent cough or cold.  Reports has "cold sores in nose."    Denies any SOB

## 2013-04-04 NOTE — ED Provider Notes (Signed)
CSN: [id number]     Arrival date & time 04/04/13  1254 History   First MD Initiated Contact with Patient 04/04/13 1434     Chief Complaint  Patient presents with  . Oral Swelling    throat swelling     HPI Pt was seen at 1445. Per pt, c/o gradual onset and persistence of constant "feeling like my throat is swollen" since this morning. Has mild pain with swallowing. States she "has felt fine" until today. Denies specific sore throat, no fevers, no dysphagia, no drooling, no stridor, no SOB/wheezing, no rash.     Past Medical History  Diagnosis Date  . Hypertension   . Fluid retention   . Diabetes mellitus without complication    Past Surgical History  Procedure Laterality Date  . Gastric bypass    . Carpal tunnel release    . Cholecystectomy    . Neck surgery    . Shoulder surgery      History  Substance Use Topics  . Smoking status: Never Smoker   . Smokeless tobacco: Not on file  . Alcohol Use: No    Review of Systems ROS: Statement: All systems negative except as marked or noted in the HPI; Constitutional: Negative for fever and chills. ; ; Eyes: Negative for eye pain, redness and discharge. ; ; ENMT: +"throat swelling." Negative for ear pain, hoarseness, nasal congestion, sinus pressure and sore throat. ; ; Cardiovascular: Negative for chest pain, palpitations, diaphoresis, dyspnea and peripheral edema. ; ; Respiratory: Negative for cough, wheezing and stridor. ; ; Gastrointestinal: Negative for nausea, vomiting, diarrhea, abdominal pain, blood in stool, hematemesis, jaundice and rectal bleeding. . ; ; Genitourinary: Negative for dysuria, flank pain and hematuria. ; ; Musculoskeletal: Negative for back pain and neck pain. Negative for swelling and trauma.; ; Skin: Negative for pruritus, rash, abrasions, blisters, bruising and skin lesion.; ; Neuro: Negative for headache, lightheadedness and neck stiffness. Negative for weakness, altered level of consciousness , altered mental  status, extremity weakness, paresthesias, involuntary movement, seizure and syncope.      Allergies  Penicillins  Home Medications   Current Outpatient Rx  Name  Route  Sig  Dispense  Refill  . losartan (COZAAR) 50 MG tablet   Oral   Take 50 mg by mouth daily.         . metFORMIN (GLUCOPHAGE) 1000 MG tablet   Oral   Take 1,000 mg by mouth daily.         Marland Kitchen omeprazole (PRILOSEC) 20 MG capsule   Oral   Take 1 capsule by mouth daily.         Marland Kitchen torsemide (DEMADEX) 20 MG tablet   Oral   Take 2 tablets by mouth daily.          BP 152/72  Pulse 59  Temp(Src) 98.3 F (36.8 C)  Resp 20  Ht 5\' 7"  (1.702 m)  Wt 283 lb (128.368 kg)  BMI 44.31 kg/m2  SpO2 100% Physical Exam 1450: Physical examination:  Nursing notes reviewed; Vital signs and O2 SAT reviewed;  Constitutional: Well developed, Well nourished, Well hydrated, In no acute distress; Head:  Normocephalic, atraumatic; Eyes: EOMI, PERRL, No scleral icterus; ENMT: TM's clear bilat. +edemetous nasal turbinates bilat with clear rhinorrhea, no lesions. Mouth and pharynx without lesions. No tonsillar exudates. +left peritonsillar and soft palate bulging, no open wounds, no lesions, no erythema. No submandibular or sublingual edema. +mild hoarse voice. No drooling, no stridor. No pain with manipulation of larynx.  No trismus. Mucous membranes moist; Neck: Supple, Full range of motion, No lymphadenopathy; Cardiovascular: Regular rate and rhythm, No gallop; Respiratory: Breath sounds clear & equal bilaterally, No wheezes.  Speaking full sentences with ease, Normal respiratory effort/excursion; Chest: Nontender, Movement normal; Abdomen: Soft, Nontender, Nondistended, Normal bowel sounds; Genitourinary: No CVA tenderness; Extremities: Pulses normal, No tenderness, No edema, No calf edema or asymmetry.; Neuro: AA&Ox3, Major CN grossly intact.  Speech clear. No gross focal motor or sensory deficits in extremities.; Skin: Color normal,  Warm, Dry.   ED Course  Procedures     EKG Interpretation   None       MDM  MDM Reviewed: previous chart, nursing note and vitals Reviewed previous: labs Interpretation: labs and CT scan    Results for orders placed during the hospital encounter of 04/04/13  RAPID STREP SCREEN      Result Value Ref Range   Streptococcus, Group A Screen (Direct) NEGATIVE  NEGATIVE  CBC WITH DIFFERENTIAL      Result Value Ref Range   WBC 15.6 (*) 4.0 - 10.5 K/uL   RBC 4.69  3.87 - 5.11 MIL/uL   Hemoglobin 13.5  12.0 - 15.0 g/dL   HCT 40.2  36.0 - 46.0 %   MCV 85.7  78.0 - 100.0 fL   MCH 28.8  26.0 - 34.0 pg   MCHC 33.6  30.0 - 36.0 g/dL   RDW 14.0  11.5 - 15.5 %   Platelets 297  150 - 400 K/uL   Neutrophils Relative % 83 (*) 43 - 77 %   Neutro Abs 13.0 (*) 1.7 - 7.7 K/uL   Lymphocytes Relative 10 (*) 12 - 46 %   Lymphs Abs 1.5  0.7 - 4.0 K/uL   Monocytes Relative 7  3 - 12 %   Monocytes Absolute 1.0  0.1 - 1.0 K/uL   Eosinophils Relative 1  0 - 5 %   Eosinophils Absolute 0.1  0.0 - 0.7 K/uL   Basophils Relative 0  0 - 1 %   Basophils Absolute 0.0  0.0 - 0.1 K/uL  BASIC METABOLIC PANEL      Result Value Ref Range   Sodium 136 (*) 137 - 147 mEq/L   Potassium 3.2 (*) 3.7 - 5.3 mEq/L   Chloride 92 (*) 96 - 112 mEq/L   CO2 32  19 - 32 mEq/L   Glucose, Bld 116 (*) 70 - 99 mg/dL   BUN 16  6 - 23 mg/dL   Creatinine, Ser 0.70  0.50 - 1.10 mg/dL   Calcium 9.9  8.4 - 10.5 mg/dL   GFR calc non Af Amer >90  >90 mL/min   GFR calc Af Amer >90  >90 mL/min   Ct Soft Tissue Neck W Contrast 04/04/2013   CLINICAL DATA:  Sore throat with swelling  EXAM: CT NECK WITH CONTRAST  TECHNIQUE: Multidetector CT imaging of the neck was performed using the standard protocol following the bolus administration of intravenous contrast.  CONTRAST:  70mL OMNIPAQUE IOHEXOL 300 MG/ML  SOLN  COMPARISON:  None.  FINDINGS: Soft tissue swelling of the left tonsil. This shows diffuse soft tissue swelling and increased  enhancement. There are small fluid collections within the left tonsil measuring approximately 6 and 8 mm. These are side-by-side and could be connected or loculated. These appear to be early abscesses.  There is marked swelling of the uvula. Epiglottis and aryepiglottic folds are normal.  Left level 2 lymph nodes measure 9 mm and 10  mm.  The oropharynx is displaced slightly to the right and mildly narrow due to tonsillar edema.  Parotid and submandibular glands are normal. Right thyroid nodule measures 15 mm. Lung apices are clear.  Anterior cervical fusion C5-6 with anterior plate. No acute bony change.  IMPRESSION: Moderate enlargement of the left palatine tonsil with small loculated abscesses. Majority of the swelling is due to phlegmon. There is extensive edema in the uvula.  Right thyroid nodule.  Recommend thyroid ultrasound.   Electronically Signed   By: Franchot Gallo M.D.   On: 04/04/2013 15:47    1630:  Pt's VS remain stable. Speech clear, no drooling/stridor/SOB. Family states pt's voice/tone is per her baseline. IV decadron and clindamycin given. CT scan with soft tissue swelling and without drainable abscess. Pt states she wants to go home now; though is agreeable to have me speak with ENT MD.  T/C to ENT Dr. Redmond Baseman, case discussed, including:  HPI, pertinent PM/SHx, VS/PE, dx testing, ED course and treatment: states CT scan fluid collections within the left tonsil are not drainable at this time, tx as pharyngitis with abx, f/u ofc this week. Dx and testing, as well as d/w ENT MD, d/w pt and family.  Questions answered.  Verb understanding, agreeable to d/c home with outpt f/u.     Alfonzo Feller, DO 04/07/13 [phone]

## 2013-04-06 LAB — CULTURE, GROUP A STREP

## 2013-04-07 ENCOUNTER — Encounter (HOSPITAL_COMMUNITY): Payer: Self-pay | Admitting: Emergency Medicine

## 2013-04-07 ENCOUNTER — Emergency Department (HOSPITAL_COMMUNITY)
Admission: EM | Admit: 2013-04-07 | Discharge: 2013-04-07 | Disposition: A | Discharge status: Home / Self Care | Payer: Medicare Other | Attending: Emergency Medicine | Admitting: Emergency Medicine

## 2013-04-07 DIAGNOSIS — I10 Essential (primary) hypertension: Secondary | ICD-10-CM | POA: Insufficient documentation

## 2013-04-07 DIAGNOSIS — E119 Type 2 diabetes mellitus without complications: Secondary | ICD-10-CM | POA: Insufficient documentation

## 2013-04-07 DIAGNOSIS — Z79899 Other long term (current) drug therapy: Secondary | ICD-10-CM | POA: Insufficient documentation

## 2013-04-07 DIAGNOSIS — Z880 Allergy status to penicillin: Secondary | ICD-10-CM | POA: Insufficient documentation

## 2013-04-07 DIAGNOSIS — Z792 Long term (current) use of antibiotics: Secondary | ICD-10-CM | POA: Insufficient documentation

## 2013-04-07 DIAGNOSIS — J029 Acute pharyngitis, unspecified: Secondary | ICD-10-CM

## 2013-04-07 LAB — I-STAT CHEM 8, ED
BUN: 10 mg/dL (ref 6–23)
CALCIUM ION: 1.22 mmol/L (ref 1.12–1.23)
CHLORIDE: 97 meq/L (ref 96–112)
CREATININE: 0.8 mg/dL (ref 0.50–1.10)
GLUCOSE: 143 mg/dL — AB (ref 70–99)
HCT: 42 % (ref 36.0–46.0)
Hemoglobin: 14.3 g/dL (ref 12.0–15.0)
Potassium: 3.1 mEq/L — ABNORMAL LOW (ref 3.7–5.3)
Sodium: 140 mEq/L (ref 137–147)
TCO2: 29 mmol/L (ref 0–100)

## 2013-04-07 LAB — CBC WITH DIFFERENTIAL/PLATELET
Basophils Absolute: 0 10*3/uL (ref 0.0–0.1)
Basophils Relative: 0 % (ref 0–1)
EOS PCT: 1 % (ref 0–5)
Eosinophils Absolute: 0.2 10*3/uL (ref 0.0–0.7)
HEMATOCRIT: 37.1 % (ref 36.0–46.0)
HEMOGLOBIN: 13.1 g/dL (ref 12.0–15.0)
LYMPHS PCT: 12 % (ref 12–46)
Lymphs Abs: 1.7 10*3/uL (ref 0.7–4.0)
MCH: 29.9 pg (ref 26.0–34.0)
MCHC: 35.3 g/dL (ref 30.0–36.0)
MCV: 84.7 fL (ref 78.0–100.0)
MONO ABS: 1.4 10*3/uL — AB (ref 0.1–1.0)
MONOS PCT: 10 % (ref 3–12)
NEUTROS ABS: 11.3 10*3/uL — AB (ref 1.7–7.7)
Neutrophils Relative %: 77 % (ref 43–77)
Platelets: 323 10*3/uL (ref 150–400)
RBC: 4.38 MIL/uL (ref 3.87–5.11)
RDW: 14 % (ref 11.5–15.5)
WBC: 14.6 10*3/uL — AB (ref 4.0–10.5)

## 2013-04-07 NOTE — ED Provider Notes (Signed)
CSN: [id number]     Arrival date & time 04/07/13  1351 History   First MD Initiated Contact with Patient 04/07/13 1555     Chief Complaint  Patient presents with  . Neck Pain     (Consider location/radiation/quality/duration/timing/severity/associated sxs/prior Treatment) Patient is a 58 y.o. female presenting with neck pain. The history is provided by the patient, medical records and a significant other. No language interpreter was used.  Neck Pain Associated symptoms: no chest pain, no fever and no headaches      Amy Tran is a 58 y.o. female  with a hx of hypertension,  Non-insulin-dependent diabetes presents to the Emergency Department complaining of gradual, persistent, throat pain and swelling.  Patient was seen again and on 04/04/2013 and diagnosed with a. Tonsillar abscess that was unable to be drained at that time. Patient saw Dr. Janace Hoard of [facility] ear nose and throat on Monday, 04/05/2013. Her peritonsillar abscess was I&D in the office on the day. She was instructed to continue her clindamycin.   She reports she has done so but that she has had no improvement in her symptoms since Monday. She specifically reports that she is no worse than she was on Monday she does not feel any better. She reports she called in for ear nose and throat office today and they recommended she come to the emergency department for further evaluation. She reports no fever or chills, nausea or vomiting. She reports she is tolerating by mouth without difficulty. No rash. No stiff neck. She reports continued pain with swallowing and difficulty in doing so. No decrease in the swelling of the left peritonsillar region per patient.  Past Medical History  Diagnosis Date  . Hypertension   . Fluid retention   . Diabetes mellitus without complication    Past Surgical History  Procedure Laterality Date  . Gastric bypass    . Carpal tunnel release    . Cholecystectomy    . Neck surgery    . Shoulder  surgery     No family history on file. History  Substance Use Topics  . Smoking status: Never Smoker   . Smokeless tobacco: Not on file  . Alcohol Use: No   OB History   Grav Para Term Preterm Abortions TAB SAB Ect Mult Living                 Review of Systems  Constitutional: Negative for fever, diaphoresis, appetite change, fatigue and unexpected weight change.  HENT: Positive for sore throat and trouble swallowing. Negative for mouth sores.   Respiratory: Negative for cough, chest tightness, shortness of breath and wheezing.   Cardiovascular: Negative for chest pain.  Gastrointestinal: Negative for nausea, vomiting, abdominal pain, diarrhea and constipation.  Endocrine: Negative for polydipsia, polyphagia and polyuria.  Genitourinary: Negative for dysuria, urgency, frequency and hematuria.  Musculoskeletal: Negative for back pain, neck pain and neck stiffness.  Skin: Negative for rash.  Allergic/Immunologic: Negative for immunocompromised state.  Neurological: Negative for syncope, light-headedness and headaches.  Hematological: Does not bruise/bleed easily.  Psychiatric/Behavioral: Negative for sleep disturbance. The patient is not nervous/anxious.       Allergies  Penicillins  Home Medications   Current Outpatient Rx  Name  Route  Sig  Dispense  Refill  . clindamycin (CLEOCIN) 75 MG/5ML solution   Oral   Take 30 mLs (450 mg total) by mouth 3 (three) times daily. For the next 10 days   900 mL   0   .  HYDROcodone-acetaminophen (HYCET) 7.5-325 mg/15 ml solution   Oral   Take 10 mLs by mouth every 6 (six) hours as needed for moderate pain.   120 mL   0   . losartan (COZAAR) 50 MG tablet   Oral   Take 50 mg by mouth daily.         . metFORMIN (GLUCOPHAGE) 1000 MG tablet   Oral   Take 1,000 mg by mouth daily.         Marland Kitchen omeprazole (PRILOSEC) 20 MG capsule   Oral   Take 1 capsule by mouth daily.         Marland Kitchen torsemide (DEMADEX) 20 MG tablet   Oral    Take 2 tablets by mouth daily.          BP 156/66  Pulse 49  Temp(Src) 98 F (36.7 C)  Resp 16  Wt 285 lb (129.275 kg)  SpO2 98% Physical Exam  Constitutional: She is oriented to person, place, and time. She appears well-developed and well-nourished. No distress.  HENT:  Head: Normocephalic and atraumatic.  Right Ear: Tympanic membrane, external ear and ear canal normal.  Left Ear: Tympanic membrane, external ear and ear canal normal.  Nose: Nose normal. No epistaxis. Right sinus exhibits no maxillary sinus tenderness and no frontal sinus tenderness. Left sinus exhibits no maxillary sinus tenderness and no frontal sinus tenderness.  Mouth/Throat: Uvula is midline and mucous membranes are normal. Mucous membranes are not pale and not cyanotic. Uvula swelling present. Oropharyngeal exudate, posterior oropharyngeal edema, posterior oropharyngeal erythema and tonsillar abscesses present.    Continued swelling of the left tonsillar and peritonsillar region Mild exudate noted on the left tonsil Patient with malodorous breath No visible drainage from the I&D site Mild swelling of the uvula without significant deviation  Eyes: Conjunctivae are normal. Pupils are equal, round, and reactive to light.  Neck: Normal range of motion and full passive range of motion without pain.  Normal phonation No stridor Handling secretions without difficulty Full range of motion without pain No nuchal rigidity  Cardiovascular: Regular rhythm, normal heart sounds and intact distal pulses.   No murmur heard. No tachycardia Mild bradycardia in the mid 50s   Pulmonary/Chest: Effort normal and breath sounds normal. No stridor.  Clear and equal breath sounds  Abdominal: Soft. Bowel sounds are normal. There is no tenderness.  Soft and nontender  Musculoskeletal: Normal range of motion.  Lymphadenopathy:       Head (right side): No submental, no submandibular, no tonsillar, no preauricular, no posterior  auricular and no occipital adenopathy present.       Head (left side): Submandibular and tonsillar adenopathy present. No submental, no preauricular, no posterior auricular and no occipital adenopathy present.    She has no cervical adenopathy.  Mild tonsillar and submandibular lymphadenopathy No cervical lymphadenopathy  Neurological: She is alert and oriented to person, place, and time. Coordination normal.  Skin: Skin is warm and dry. No rash noted. She is not diaphoretic. No erythema.  No petechiae or purpura  Psychiatric: She has a normal mood and affect.    ED Course  Procedures (including critical care time) Labs Review Labs Reviewed  CBC WITH DIFFERENTIAL - Abnormal; Notable for the following:    WBC 14.6 (*)    Neutro Abs 11.3 (*)    Monocytes Absolute 1.4 (*)    All other components within normal limits  I-STAT CHEM 8, ED - Abnormal; Notable for the following:    Potassium 3.1 (*)  Glucose, Bld 143 (*)    All other components within normal limits   Imaging Review No results found.  EKG Interpretation   None       MDM   Final diagnoses:  Sore throat   Amy Tran presents with persistent feeling of sore throat and difficulty swallowing after peritonsillar abscess I&D 2 days ago. Patient alert, oriented, nontoxic and nonseptic appearing. No stridor, handling secretions without difficulty and tolerating her medications at home. Patient instructed to come here by [facility]. We'll consult Dr. Janace Hoard.    4:37 PM Discussed with Dr. Wilburn Cornelia who reports that after discussing the case with Dr. Janace Hoard patient had a very large peritonsillar abscess on Monday and he would not expect her to "feel better" by today. He reports that if she is continued to take her medications and is tolerating fluids and does not appear toxic she should be discharged home with close followup with the clinic as directed.  Patient is alert, oriented, nontoxic and nonseptic appearing.  She is afebrile and not tachycardic. She has normal phonation, no stridor and is handling her secretions without difficulty. She is able to tolerate by mouth is taking her medications as directed.  Discussed with the patient and her partner. They're comfortable with this. I discussed reasons to return immediately to the emergency room including high fevers, difficulty breathing or intractable vomiting.  It has been determined that no acute conditions requiring further emergency intervention are present at this time. The patient/guardian have been advised of the diagnosis and plan. We have discussed signs and symptoms that warrant return to the ED, such as changes or worsening in symptoms.   Vital signs are stable at discharge.   BP 156/66  Pulse 49  Temp(Src) 98 F (36.7 C)  Resp 16  Wt 285 lb (129.275 kg)  SpO2 98%  Patient/guardian has voiced understanding and agreed to follow-up with the PCP or specialist.      Abigail Butts, PA-C 04/07/13 1703

## 2013-04-07 NOTE — ED Notes (Signed)
States was seen for  peritonsilar abcess on the 22  Had it drained and got her meds filled and she has been taking them but she states that she still still feels swollen in her throatpt handling her secretions well no drooling  No fever has been able to drink

## 2013-04-07 NOTE — Discharge Instructions (Signed)
1. Medications: usual home medications including clindamycin 2. Treatment: rest, drink plenty of fluids,  3. Follow Up: Please followup with Dr. Janace Hoard as directed; return to emergency department for fevers, vomiting or difficulty breathing.

## 2013-04-07 NOTE — ED Provider Notes (Signed)
Medical screening examination/treatment/procedure(s) were performed by non-physician practitioner and as supervising physician I was immediately available for consultation/collaboration.     Veryl Speak, MD 04/07/13 [phone]

## 2013-06-09 ENCOUNTER — Other Ambulatory Visit (HOSPITAL_COMMUNITY): Payer: Self-pay | Admitting: Orthopedic Surgery

## 2013-06-09 DIAGNOSIS — M25512 Pain in left shoulder: Secondary | ICD-10-CM

## 2013-06-11 ENCOUNTER — Ambulatory Visit (HOSPITAL_COMMUNITY)
Admission: RE | Admit: 2013-06-11 | Discharge: 2013-06-11 | Disposition: A | Discharge status: Home / Self Care | Payer: PRIVATE HEALTH INSURANCE | Source: Ambulatory Visit | Attending: Orthopedic Surgery | Admitting: Orthopedic Surgery

## 2013-06-11 DIAGNOSIS — M25519 Pain in unspecified shoulder: Secondary | ICD-10-CM | POA: Insufficient documentation

## 2013-06-11 DIAGNOSIS — M719 Bursopathy, unspecified: Secondary | ICD-10-CM | POA: Insufficient documentation

## 2013-06-11 DIAGNOSIS — M6790 Unspecified disorder of synovium and tendon, unspecified site: Secondary | ICD-10-CM | POA: Diagnosis not present

## 2013-06-11 DIAGNOSIS — M259 Joint disorder, unspecified: Secondary | ICD-10-CM | POA: Insufficient documentation

## 2013-06-11 DIAGNOSIS — M25419 Effusion, unspecified shoulder: Secondary | ICD-10-CM | POA: Diagnosis not present

## 2013-06-11 DIAGNOSIS — M67919 Unspecified disorder of synovium and tendon, unspecified shoulder: Secondary | ICD-10-CM | POA: Insufficient documentation

## 2013-06-11 DIAGNOSIS — M25512 Pain in left shoulder: Secondary | ICD-10-CM

## 2013-06-11 DIAGNOSIS — S43429A Sprain of unspecified rotator cuff capsule, initial encounter: Secondary | ICD-10-CM | POA: Diagnosis not present

## 2013-06-11 DIAGNOSIS — X58XXXA Exposure to other specified factors, initial encounter: Secondary | ICD-10-CM | POA: Insufficient documentation

## 2013-06-21 ENCOUNTER — Encounter (HOSPITAL_BASED_OUTPATIENT_CLINIC_OR_DEPARTMENT_OTHER): Payer: Self-pay | Admitting: *Deleted

## 2013-06-22 ENCOUNTER — Encounter (HOSPITAL_COMMUNITY)
Admission: RE | Admit: 2013-06-22 | Discharge: 2013-06-22 | Disposition: A | Discharge status: Home / Self Care | Payer: PRIVATE HEALTH INSURANCE | Source: Ambulatory Visit | Attending: Orthopedic Surgery | Admitting: Orthopedic Surgery

## 2013-06-22 ENCOUNTER — Encounter (HOSPITAL_BASED_OUTPATIENT_CLINIC_OR_DEPARTMENT_OTHER): Payer: Self-pay | Admitting: *Deleted

## 2013-06-22 ENCOUNTER — Ambulatory Visit (HOSPITAL_BASED_OUTPATIENT_CLINIC_OR_DEPARTMENT_OTHER)
Admission: RE | Admit: 2013-06-22 | Discharge: 2013-06-22 | Disposition: A | Discharge status: Home / Self Care | Payer: Medicare Other | Source: Ambulatory Visit | Attending: Orthopedic Surgery | Admitting: Orthopedic Surgery

## 2013-06-22 ENCOUNTER — Encounter: Payer: Self-pay | Admitting: Physician Assistant

## 2013-06-22 ENCOUNTER — Other Ambulatory Visit: Payer: Self-pay | Admitting: Physician Assistant

## 2013-06-22 ENCOUNTER — Other Ambulatory Visit: Payer: Self-pay

## 2013-06-22 DIAGNOSIS — I10 Essential (primary) hypertension: Secondary | ICD-10-CM | POA: Insufficient documentation

## 2013-06-22 DIAGNOSIS — E119 Type 2 diabetes mellitus without complications: Secondary | ICD-10-CM | POA: Insufficient documentation

## 2013-06-22 DIAGNOSIS — Z9889 Other specified postprocedural states: Secondary | ICD-10-CM

## 2013-06-22 DIAGNOSIS — R609 Edema, unspecified: Secondary | ICD-10-CM

## 2013-06-22 DIAGNOSIS — R112 Nausea with vomiting, unspecified: Secondary | ICD-10-CM

## 2013-06-22 DIAGNOSIS — M75110 Incomplete rotator cuff tear or rupture of unspecified shoulder, not specified as traumatic: Secondary | ICD-10-CM | POA: Insufficient documentation

## 2013-06-22 HISTORY — DX: Other specified postprocedural states: R11.2

## 2013-06-22 HISTORY — DX: Presence of dental prosthetic device (complete) (partial): Z97.2

## 2013-06-22 HISTORY — DX: Other specified postprocedural states: Z98.890

## 2013-06-22 HISTORY — DX: Presence of spectacles and contact lenses: Z97.3

## 2013-06-22 LAB — BASIC METABOLIC PANEL
BUN: 12 mg/dL (ref 6–23)
CALCIUM: 9.8 mg/dL (ref 8.4–10.5)
CHLORIDE: 97 meq/L (ref 96–112)
CO2: 32 mEq/L (ref 19–32)
CREATININE: 0.71 mg/dL (ref 0.50–1.10)
GFR calc non Af Amer: >90 mL/min (ref >90)
Glucose, Bld: 145 mg/dL — ABNORMAL HIGH (ref 70–99)
Potassium: 3.2 mEq/L — ABNORMAL LOW (ref 3.7–5.3)
Sodium: 141 mEq/L (ref 137–147)

## 2013-06-22 NOTE — Progress Notes (Signed)
To bring all meds and overnight bag just in case she has to stay Denies sleep apnea or resp or cardiac problems Going to [facility] for [id number]

## 2013-06-22 NOTE — H&P (Signed)
Amy Tran is an 58 y.o. female.   Chief Complaint: left shoulder pain HPI: Amy Tran is a 58 year old seen for follow-up from her significant persistent left shoulder pain. She had a left shoulder arthroscopy in 1998 for subacromial decompression and distal clavicle excision and was doing well until recently with significant increased pain. We injected both shoulders previously which helped minimally. She had a left shoulder MRI on 06/11/13 that revealed a partial rotator cuff tear with moderate chondromalacia in the shoulder. She continues to have significant pain. Pain with overhead use and activity and night pain as well in both shoulders.  Past Medical History  Diagnosis Date  . Hypertension   . Fluid retention   . Diabetes mellitus without complication   . PONV (postoperative nausea and vomiting)   . Wears glasses   . Wears partial dentures     bottom  . Incomplete rotator cuff tear     Past Surgical History  Procedure Laterality Date  . Gastric bypass  2004  . Carpal tunnel release  2001    left  . Cholecystectomy    . Neck surgery  1998  . Shoulder surgery  1999    left  . Ercp  2002  . Colonoscopy  2007  . Appendectomy      Family History  Problem Relation Age of Onset  . Diabetes Father   . Hypertension Father   . Hypertension Mother    Social History:  reports that she has never smoked. She does not have any smokeless tobacco history on file. She reports that she does not drink alcohol or use illicit drugs.  Allergies:  Allergies  Allergen Reactions  . Penicillins Hives   Current Outpatient Prescriptions on File Prior to Visit  Medication Sig Dispense Refill  . HYDROcodone-acetaminophen (HYCET) 7.5-325 mg/15 ml solution Take 10 mLs by mouth every 6 (six) hours as needed for moderate pain.  120 mL  0  . losartan (COZAAR) 50 MG tablet Take 50 mg by mouth daily.      . metFORMIN (GLUCOPHAGE) 1000 MG tablet Take 1,000 mg by mouth daily.      Marland Kitchen omeprazole  (PRILOSEC) 20 MG capsule Take 1 capsule by mouth daily.      Marland Kitchen torsemide (DEMADEX) 20 MG tablet Take 2 tablets by mouth daily.       No current facility-administered medications on file prior to visit.    (Not in a hospital admission)  Results for orders placed during the hospital encounter of 06/22/13 (from the past 48 hour(s))  BASIC METABOLIC PANEL     Status: Abnormal   Collection Time    06/22/13 11:30 AM      Result Value Ref Range   Sodium 141  137 - 147 mEq/L   Potassium 3.2 (*) 3.7 - 5.3 mEq/L   Chloride 97  96 - 112 mEq/L   CO2 32  19 - 32 mEq/L   Glucose, Bld 145 (*) 70 - 99 mg/dL   BUN 12  6 - 23 mg/dL   Creatinine, Ser 0.71  0.50 - 1.10 mg/dL   Calcium 9.8  8.4 - 10.5 mg/dL   GFR calc non Af Amer >90  >90 mL/min   GFR calc Af Amer >90  >90 mL/min   Comment: (NOTE)     The eGFR has been calculated using the CKD EPI equation.     This calculation has not been validated in all clinical situations.     eGFR's  persistently <90 mL/min signify possible Chronic Kidney     Disease.   No results found.  Review of Systems  Constitutional: Negative.   HENT: Negative.   Eyes: Negative.   Respiratory: Negative.   Cardiovascular: Negative.   Gastrointestinal: Negative.   Genitourinary: Negative.   Musculoskeletal: Positive for joint pain.       Left shoulder pain  Skin: Negative.   Neurological: Negative.   Endo/Heme/Allergies: Negative.   Psychiatric/Behavioral: Negative.     Height 5' 4"  (1.626 m), weight 128.368 kg (283 lb). Physical Exam  Constitutional: She is oriented to person, place, and time. She appears well-developed and well-nourished.  HENT:  Head: Normocephalic and atraumatic.  Eyes: Pupils are equal, round, and reactive to light.  Neck: Neck supple.  Cardiovascular: Normal rate.   Respiratory: Effort normal.  GI: Soft.  Genitourinary:  Not pertinent to current symptomatology therefore not examined.  Musculoskeletal:  Examination of the left  shoulder reveals forward flexion of 170 with pain and mild weakness abduction of 160 with pain and mild weakness, internal and external rotation of 70 degrees with pain and mild weakness no instability. Exam of the right shoulder reveals forward flexion of 170 with pain and mild weakness abduction of 160 with pain and mild weakness, internal and external rotation of 70 degrees with pain and mild weakness no instability. Vascular exam: pulses 2+ and symmetric.  Neurological: She is alert and oriented to person, place, and time.  Skin: Skin is warm and dry.  Psychiatric: She has a normal mood and affect. Her behavior is normal.     Assessment Patient Active Problem List   Diagnosis Date Noted  . Incomplete rotator cuff tear   . Diabetes mellitus without complication   . PONV (postoperative nausea and vomiting)   . Fluid retention   . Hypertension     Plan Due to her significant left shoulder pain and lack of response to conservative care I recommend we proceed with left shoulder arthroscopy with debridement versus repair of the rotator cuff with chondroplasty. Discussed risks benefits and possible complications of the surgery in detail and she understands this completely. We will set her up for this when she is ready to proceed.  Amy Tran J Xavier Fournier 06/22/2013, 12:17 PM

## 2013-06-22 NOTE — Progress Notes (Signed)
06/22/13 0955  OBSTRUCTIVE SLEEP APNEA  Have you ever been diagnosed with sleep apnea through a sleep study? No  Do you snore loudly (loud enough to be heard through closed doors)?  0  Do you often feel tired, fatigued, or sleepy during the daytime? 0  Has anyone observed you stop breathing during your sleep? 0  Do you have, or are you being treated for high blood pressure? 1  BMI more than 35 kg/m2? 1  Age over 58 years old? 1  Neck circumference greater than 40 cm/16 inches? 1  Gender: 0  Obstructive Sleep Apnea Score 4  Score 4 or greater  Results sent to PCP

## 2013-06-23 ENCOUNTER — Encounter (HOSPITAL_BASED_OUTPATIENT_CLINIC_OR_DEPARTMENT_OTHER): Payer: Self-pay | Admitting: Certified Registered"

## 2013-06-23 ENCOUNTER — Encounter (HOSPITAL_BASED_OUTPATIENT_CLINIC_OR_DEPARTMENT_OTHER): Payer: PRIVATE HEALTH INSURANCE | Admitting: Certified Registered"

## 2013-06-23 ENCOUNTER — Encounter (HOSPITAL_BASED_OUTPATIENT_CLINIC_OR_DEPARTMENT_OTHER)
Admission: RE | Disposition: A | Discharge status: Home / Self Care | Payer: Self-pay | Source: Ambulatory Visit | Attending: Orthopedic Surgery

## 2013-06-23 ENCOUNTER — Ambulatory Visit (HOSPITAL_BASED_OUTPATIENT_CLINIC_OR_DEPARTMENT_OTHER): Payer: PRIVATE HEALTH INSURANCE | Admitting: Certified Registered"

## 2013-06-23 ENCOUNTER — Ambulatory Visit (HOSPITAL_BASED_OUTPATIENT_CLINIC_OR_DEPARTMENT_OTHER)
Admission: RE | Admit: 2013-06-23 | Discharge: 2013-06-23 | Disposition: A | Discharge status: Home / Self Care | Payer: PRIVATE HEALTH INSURANCE | Source: Ambulatory Visit | Attending: Orthopedic Surgery | Admitting: Orthopedic Surgery

## 2013-06-23 DIAGNOSIS — M9420 Chondromalacia, unspecified site: Secondary | ICD-10-CM | POA: Insufficient documentation

## 2013-06-23 DIAGNOSIS — Z9889 Other specified postprocedural states: Secondary | ICD-10-CM | POA: Diagnosis present

## 2013-06-23 DIAGNOSIS — R112 Nausea with vomiting, unspecified: Secondary | ICD-10-CM | POA: Diagnosis present

## 2013-06-23 DIAGNOSIS — M67919 Unspecified disorder of synovium and tendon, unspecified shoulder: Secondary | ICD-10-CM | POA: Insufficient documentation

## 2013-06-23 DIAGNOSIS — M75110 Incomplete rotator cuff tear or rupture of unspecified shoulder, not specified as traumatic: Secondary | ICD-10-CM | POA: Diagnosis present

## 2013-06-23 DIAGNOSIS — Z880 Allergy status to penicillin: Secondary | ICD-10-CM | POA: Insufficient documentation

## 2013-06-23 DIAGNOSIS — Z79899 Other long term (current) drug therapy: Secondary | ICD-10-CM | POA: Insufficient documentation

## 2013-06-23 DIAGNOSIS — E119 Type 2 diabetes mellitus without complications: Secondary | ICD-10-CM | POA: Diagnosis present

## 2013-06-23 DIAGNOSIS — Z9884 Bariatric surgery status: Secondary | ICD-10-CM | POA: Insufficient documentation

## 2013-06-23 DIAGNOSIS — M719 Bursopathy, unspecified: Principal | ICD-10-CM | POA: Insufficient documentation

## 2013-06-23 DIAGNOSIS — I10 Essential (primary) hypertension: Secondary | ICD-10-CM | POA: Diagnosis present

## 2013-06-23 HISTORY — PX: SHOULDER ARTHROSCOPY WITH SUBACROMIAL DECOMPRESSION: SHX5684

## 2013-06-23 LAB — GLUCOSE, CAPILLARY
GLUCOSE-CAPILLARY: 157 mg/dL — AB (ref 70–99)
GLUCOSE-CAPILLARY: 163 mg/dL — AB (ref 70–99)

## 2013-06-23 LAB — POCT HEMOGLOBIN-HEMACUE: Hemoglobin: 11.6 g/dL — ABNORMAL LOW (ref 12.0–15.0)

## 2013-06-23 SURGERY — SHOULDER ARTHROSCOPY WITH SUBACROMIAL DECOMPRESSION
Anesthesia: Regional | Site: Shoulder | Laterality: Left

## 2013-06-23 MED ORDER — MIDAZOLAM HCL 2 MG/2ML IJ SOLN
INTRAMUSCULAR | Status: AC
  Filled 2013-06-23: qty 2

## 2013-06-23 MED ORDER — SODIUM CHLORIDE 0.9 % IR SOLN
Status: DC | PRN
  Administered 2013-06-23: 10:00:00

## 2013-06-23 MED ORDER — ONDANSETRON 8 MG PO TBDP
ORAL_TABLET | ORAL | Status: AC
  Filled 2013-06-23: qty 1

## 2013-06-23 MED ORDER — LIDOCAINE HCL (CARDIAC) 20 MG/ML IV SOLN
INTRAVENOUS | Status: DC | PRN
  Administered 2013-06-23: 80 mg via INTRAVENOUS

## 2013-06-23 MED ORDER — CHLORHEXIDINE GLUCONATE 4 % EX LIQD: 60.0000 mL | Freq: Once | CUTANEOUS | Status: DC

## 2013-06-23 MED ORDER — ONDANSETRON 8 MG PO TBDP
8.0000 mg | ORAL_TABLET | Freq: Once | ORAL | Status: AC
  Administered 2013-06-23: 8 mg via ORAL

## 2013-06-23 MED ORDER — FENTANYL CITRATE 0.05 MG/ML IJ SOLN
INTRAMUSCULAR | Status: AC
  Filled 2013-06-23: qty 2

## 2013-06-23 MED ORDER — PROPOFOL 10 MG/ML IV BOLUS
INTRAVENOUS | Status: DC | PRN
  Administered 2013-06-23: 200 mg via INTRAVENOUS
  Administered 2013-06-23: 50 mg via INTRAVENOUS

## 2013-06-23 MED ORDER — CLINDAMYCIN PHOSPHATE 900 MG/50ML IV SOLN
INTRAVENOUS | Status: AC
  Filled 2013-06-23: qty 50

## 2013-06-23 MED ORDER — HYDROCODONE-ACETAMINOPHEN 10-325 MG PO TABS: 1.0000 | ORAL_TABLET | Freq: Four times a day (QID) | ORAL | Status: DC | PRN

## 2013-06-23 MED ORDER — ONDANSETRON HCL 4 MG/2ML IJ SOLN
INTRAMUSCULAR | Status: DC | PRN
  Administered 2013-06-23: 4 mg via INTRAVENOUS

## 2013-06-23 MED ORDER — OXYCODONE HCL 5 MG/5ML PO SOLN: 5.0000 mg | Freq: Once | ORAL | Status: DC | PRN

## 2013-06-23 MED ORDER — FENTANYL CITRATE 0.05 MG/ML IJ SOLN
INTRAMUSCULAR | Status: DC | PRN
Start: 2013-06-23 — End: 2013-06-23
  Administered 2013-06-23: 25 ug via INTRAVENOUS

## 2013-06-23 MED ORDER — FENTANYL CITRATE 0.05 MG/ML IJ SOLN
INTRAMUSCULAR | Status: AC
  Filled 2013-06-23: qty 4

## 2013-06-23 MED ORDER — SUCCINYLCHOLINE CHLORIDE 20 MG/ML IJ SOLN
INTRAMUSCULAR | Status: DC | PRN
  Administered 2013-06-23: 140 mg via INTRAVENOUS

## 2013-06-23 MED ORDER — HYDROMORPHONE HCL PF 1 MG/ML IJ SOLN
0.2500 mg | INTRAMUSCULAR | Status: DC | PRN
Start: 2013-06-23 — End: 2013-06-23

## 2013-06-23 MED ORDER — CLINDAMYCIN PHOSPHATE 900 MG/50ML IV SOLN
900.0000 mg | INTRAVENOUS | Status: AC
  Administered 2013-06-23: 900 mg via INTRAVENOUS

## 2013-06-23 MED ORDER — ONDANSETRON HCL 4 MG/2ML IJ SOLN: 4.0000 mg | Freq: Once | INTRAMUSCULAR | Status: DC | PRN

## 2013-06-23 MED ORDER — MIDAZOLAM HCL 2 MG/2ML IJ SOLN
1.0000 mg | INTRAMUSCULAR | Status: DC | PRN
  Administered 2013-06-23: 2 mg via INTRAVENOUS

## 2013-06-23 MED ORDER — LACTATED RINGERS IV SOLN
INTRAVENOUS | Status: DC
  Administered 2013-06-23: 09:00:00 via INTRAVENOUS

## 2013-06-23 MED ORDER — OXYCODONE HCL 5 MG PO TABS: 5.0000 mg | ORAL_TABLET | Freq: Once | ORAL | Status: DC | PRN

## 2013-06-23 MED ORDER — DEXAMETHASONE SODIUM PHOSPHATE 4 MG/ML IJ SOLN
INTRAMUSCULAR | Status: DC | PRN
  Administered 2013-06-23: 4 mg via INTRAVENOUS

## 2013-06-23 MED ORDER — BUPIVACAINE-EPINEPHRINE (PF) 0.25% -1:200000 IJ SOLN
INTRAMUSCULAR | Status: AC
  Filled 2013-06-23: qty 30

## 2013-06-23 MED ORDER — BUPIVACAINE-EPINEPHRINE (PF) 0.5% -1:200000 IJ SOLN
INTRAMUSCULAR | Status: DC | PRN
  Administered 2013-06-23: 25 mL

## 2013-06-23 MED ORDER — FENTANYL CITRATE 0.05 MG/ML IJ SOLN
50.0000 ug | INTRAMUSCULAR | Status: DC | PRN
  Administered 2013-06-23: 100 ug via INTRAVENOUS

## 2013-06-23 SURGICAL SUPPLY — 76 items
BENZOIN TINCTURE PRP APPL 2/3 (GAUZE/BANDAGES/DRESSINGS) IMPLANT
BLADE 15 SAFETY STRL DISP (BLADE) IMPLANT
BLADE CUDA 5.5 (BLADE) IMPLANT
BLADE CUTTER GATOR 3.5 (BLADE) ×3 IMPLANT
BLADE GREAT WHITE 4.2 (BLADE) IMPLANT
BLADE GREAT WHITE 4.2MM (BLADE)
BLADE SURG ROTATE 9660 (MISCELLANEOUS) IMPLANT
BNDG COHESIVE 4X5 TAN STRL (GAUZE/BANDAGES/DRESSINGS) ×3 IMPLANT
BUR OVAL 6.0 (BURR) ×3 IMPLANT
CANISTER SUCT 3000ML (MISCELLANEOUS) IMPLANT
CANNULA TWIST IN 8.25X7CM (CANNULA) IMPLANT
CLOSURE WOUND 1/2 X4 (GAUZE/BANDAGES/DRESSINGS)
DECANTER SPIKE VIAL GLASS SM (MISCELLANEOUS) IMPLANT
DRAPE SHOULDER BEACH CHAIR (DRAPES) ×3 IMPLANT
DRAPE U-SHAPE 47X51 STRL (DRAPES) ×6 IMPLANT
DRSG PAD ABDOMINAL 8X10 ST (GAUZE/BANDAGES/DRESSINGS) ×3 IMPLANT
DURAPREP 26ML APPLICATOR (WOUND CARE) ×3 IMPLANT
ELECT REM PT RETURN 9FT ADLT (ELECTROSURGICAL) ×3
ELECTRODE REM PT RTRN 9FT ADLT (ELECTROSURGICAL) ×1 IMPLANT
GAUZE SPONGE 4X4 12PLY STRL (GAUZE/BANDAGES/DRESSINGS) ×3 IMPLANT
GAUZE XEROFORM 1X8 LF (GAUZE/BANDAGES/DRESSINGS) ×3 IMPLANT
GLOVE BIO SURGEON STRL SZ7 (GLOVE) IMPLANT
GLOVE BIOGEL PI IND STRL 7.0 (GLOVE) IMPLANT
GLOVE BIOGEL PI IND STRL 7.5 (GLOVE) ×1 IMPLANT
GLOVE BIOGEL PI INDICATOR 7.0 (GLOVE)
GLOVE BIOGEL PI INDICATOR 7.5 (GLOVE) ×2
GLOVE SS BIOGEL STRL SZ 7.5 (GLOVE) ×1 IMPLANT
GLOVE SUPERSENSE BIOGEL SZ 7.5 (GLOVE) ×2
GOWN STRL REUS W/ TWL LRG LVL3 (GOWN DISPOSABLE) ×1 IMPLANT
GOWN STRL REUS W/TWL LRG LVL3 (GOWN DISPOSABLE) ×2
LOOP 2 FIBERLINK CLOSED (SUTURE) IMPLANT
MANIFOLD NEPTUNE II (INSTRUMENTS) ×3 IMPLANT
NDL SAFETY ECLIPSE 18X1.5 (NEEDLE) ×1 IMPLANT
NDL SUT 6 .5 CRC .975X.05 MAYO (NEEDLE) IMPLANT
NEEDLE 1/2 CIR CATGUT .05X1.09 (NEEDLE) IMPLANT
NEEDLE HYPO 18GX1.5 SHARP (NEEDLE) ×2
NEEDLE MAYO TAPER (NEEDLE)
NEEDLE SCORPION MULTI FIRE (NEEDLE) IMPLANT
PACK ARTHROSCOPY DSU (CUSTOM PROCEDURE TRAY) ×3 IMPLANT
PACK BASIN DAY SURGERY FS (CUSTOM PROCEDURE TRAY) ×3 IMPLANT
PAD ALCOHOL SWAB (MISCELLANEOUS) ×6 IMPLANT
PENCIL BUTTON HOLSTER BLD 10FT (ELECTRODE) IMPLANT
SET ARTHROSCOPY TUBING (MISCELLANEOUS) ×2
SET ARTHROSCOPY TUBING LN (MISCELLANEOUS) ×1 IMPLANT
SHEET MEDIUM DRAPE 40X70 STRL (DRAPES) IMPLANT
SLEEVE SCD COMPRESS KNEE MED (MISCELLANEOUS) IMPLANT
SLING ARM IMMOBILIZER MED (SOFTGOODS) IMPLANT
SLING ARM LRG ADULT FOAM STRAP (SOFTGOODS) IMPLANT
SLING ARM MED ADULT FOAM STRAP (SOFTGOODS) IMPLANT
SLING ARM XL FOAM STRAP (SOFTGOODS) IMPLANT
SLING ULTRA III MED (ORTHOPEDIC SUPPLIES) IMPLANT
SPONGE LAP 4X18 X RAY DECT (DISPOSABLE) IMPLANT
STRIP CLOSURE SKIN 1/2X4 (GAUZE/BANDAGES/DRESSINGS) IMPLANT
SUCTION FRAZIER TIP 10 FR DISP (SUCTIONS) IMPLANT
SUT ETHILON 3 0 PS 1 (SUTURE) ×3 IMPLANT
SUT FIBERWIRE #2 38 T-5 BLUE (SUTURE)
SUT PDS AB 2-0 CT2 27 (SUTURE) IMPLANT
SUT PROLENE 3 0 PS 2 (SUTURE) IMPLANT
SUT TIGER TAPE 7 IN WHITE (SUTURE) IMPLANT
SUT VIC AB 0 SH 27 (SUTURE) IMPLANT
SUT VIC AB 2-0 PS2 27 (SUTURE) IMPLANT
SUT VIC AB 2-0 SH 27 (SUTURE)
SUT VIC AB 2-0 SH 27XBRD (SUTURE) IMPLANT
SUTURE FIBERWR #2 38 T-5 BLUE (SUTURE) IMPLANT
SYR 20CC LL (SYRINGE) IMPLANT
SYR 5ML LL (SYRINGE) ×3 IMPLANT
SYR BULB 3OZ (MISCELLANEOUS) IMPLANT
TAPE FIBER 2MM 7IN #2 BLUE (SUTURE) IMPLANT
TAPE HYPAFIX 6 X30' (GAUZE/BANDAGES/DRESSINGS)
TAPE HYPAFIX 6X30 (GAUZE/BANDAGES/DRESSINGS) IMPLANT
TAPE STRIPS DRAPE STRL (GAUZE/BANDAGES/DRESSINGS) ×3 IMPLANT
TOWEL OR 17X24 6PK STRL BLUE (TOWEL DISPOSABLE) ×3 IMPLANT
TUBE CONNECTING 20'X1/4 (TUBING)
TUBE CONNECTING 20X1/4 (TUBING) IMPLANT
WAND STAR VAC 90 (SURGICAL WAND) ×3 IMPLANT
WATER STERILE IRR 1000ML POUR (IV SOLUTION) ×3 IMPLANT

## 2013-06-23 NOTE — Progress Notes (Signed)
Assisted Dr. Crews with left, ultrasound guided, interscalene  block. Side rails up, monitors on throughout procedure. See vital signs in flow sheet. Tolerated Procedure well. 

## 2013-06-23 NOTE — H&P (View-Only) (Signed)
Amy Tran is an 58 y.o. female.   Chief Complaint: left shoulder pain HPI: Amy Tran is a 58 year old seen for follow-up from her significant persistent left shoulder pain. She had a left shoulder arthroscopy in 1998 for subacromial decompression and distal clavicle excision and was doing well until recently with significant increased pain. We injected both shoulders previously which helped minimally. She had a left shoulder MRI on 06/11/13 that revealed a partial rotator cuff tear with moderate chondromalacia in the shoulder. She continues to have significant pain. Pain with overhead use and activity and night pain as well in both shoulders.  Past Medical History  Diagnosis Date  . Hypertension   . Fluid retention   . Diabetes mellitus without complication   . PONV (postoperative nausea and vomiting)   . Wears glasses   . Wears partial dentures     bottom  . Incomplete rotator cuff tear     Past Surgical History  Procedure Laterality Date  . Gastric bypass  2004  . Carpal tunnel release  2001    left  . Cholecystectomy    . Neck surgery  1998  . Shoulder surgery  1999    left  . Ercp  2002  . Colonoscopy  2007  . Appendectomy      Family History  Problem Relation Age of Onset  . Diabetes Father   . Hypertension Father   . Hypertension Mother    Social History:  reports that she has never smoked. She does not have any smokeless tobacco history on file. She reports that she does not drink alcohol or use illicit drugs.  Allergies:  Allergies  Allergen Reactions  . Penicillins Hives   Current Outpatient Prescriptions on File Prior to Visit  Medication Sig Dispense Refill  . HYDROcodone-acetaminophen (HYCET) 7.5-325 mg/15 ml solution Take 10 mLs by mouth every 6 (six) hours as needed for moderate pain.  120 mL  0  . losartan (COZAAR) 50 MG tablet Take 50 mg by mouth daily.      . metFORMIN (GLUCOPHAGE) 1000 MG tablet Take 1,000 mg by mouth daily.      Marland Kitchen omeprazole  (PRILOSEC) 20 MG capsule Take 1 capsule by mouth daily.      Marland Kitchen torsemide (DEMADEX) 20 MG tablet Take 2 tablets by mouth daily.       No current facility-administered medications on file prior to visit.    (Not in a hospital admission)  Results for orders placed during the hospital encounter of 06/22/13 (from the past 48 hour(s))  BASIC METABOLIC PANEL     Status: Abnormal   Collection Time    06/22/13 11:30 AM      Result Value Ref Range   Sodium 141  137 - 147 mEq/L   Potassium 3.2 (*) 3.7 - 5.3 mEq/L   Chloride 97  96 - 112 mEq/L   CO2 32  19 - 32 mEq/L   Glucose, Bld 145 (*) 70 - 99 mg/dL   BUN 12  6 - 23 mg/dL   Creatinine, Ser 0.71  0.50 - 1.10 mg/dL   Calcium 9.8  8.4 - 10.5 mg/dL   GFR calc non Af Amer >90  >90 mL/min   GFR calc Af Amer >90  >90 mL/min   Comment: (NOTE)     The eGFR has been calculated using the CKD EPI equation.     This calculation has not been validated in all clinical situations.     eGFR's  persistently <90 mL/min signify possible Chronic Kidney     Disease.   No results found.  Review of Systems  Constitutional: Negative.   HENT: Negative.   Eyes: Negative.   Respiratory: Negative.   Cardiovascular: Negative.   Gastrointestinal: Negative.   Genitourinary: Negative.   Musculoskeletal: Positive for joint pain.       Left shoulder pain  Skin: Negative.   Neurological: Negative.   Endo/Heme/Allergies: Negative.   Psychiatric/Behavioral: Negative.     Height 5' 4"  (1.626 m), weight 128.368 kg (283 lb). Physical Exam  Constitutional: She is oriented to person, place, and time. She appears well-developed and well-nourished.  HENT:  Head: Normocephalic and atraumatic.  Eyes: Pupils are equal, round, and reactive to light.  Neck: Neck supple.  Cardiovascular: Normal rate.   Respiratory: Effort normal.  GI: Soft.  Genitourinary:  Not pertinent to current symptomatology therefore not examined.  Musculoskeletal:  Examination of the left  shoulder reveals forward flexion of 170 with pain and mild weakness abduction of 160 with pain and mild weakness, internal and external rotation of 70 degrees with pain and mild weakness no instability. Exam of the right shoulder reveals forward flexion of 170 with pain and mild weakness abduction of 160 with pain and mild weakness, internal and external rotation of 70 degrees with pain and mild weakness no instability. Vascular exam: pulses 2+ and symmetric.  Neurological: She is alert and oriented to person, place, and time.  Skin: Skin is warm and dry.  Psychiatric: She has a normal mood and affect. Her behavior is normal.     Assessment Patient Active Problem List   Diagnosis Date Noted  . Incomplete rotator cuff tear   . Diabetes mellitus without complication   . PONV (postoperative nausea and vomiting)   . Fluid retention   . Hypertension     Plan Due to her significant left shoulder pain and lack of response to conservative care I recommend we proceed with left shoulder arthroscopy with debridement versus repair of the rotator cuff with chondroplasty. Discussed risks benefits and possible complications of the surgery in detail and she understands this completely. We will set her up for this when she is ready to proceed.  Amy Tran J Reva Pinkley 06/22/2013, 12:17 PM

## 2013-06-23 NOTE — Anesthesia Preprocedure Evaluation (Signed)
Anesthesia Evaluation  Patient identified by MRN, date of birth, ID band Patient awake    Reviewed: Allergy & Precautions, H&P , NPO status , Patient's Chart, lab work & pertinent test results  Airway Mallampati: I TM Distance: >3 FB Neck ROM: Full    Dental  (+) Teeth Intact, Dental Advisory Given   Pulmonary  breath sounds clear to auscultation        Cardiovascular hypertension, Pt. on medications Rhythm:Regular Rate:Normal     Neuro/Psych    GI/Hepatic   Endo/Other  diabetes, Well Controlled, Type 2, Oral Hypoglycemic AgentsMorbid obesity  Renal/GU      Musculoskeletal   Abdominal   Peds  Hematology   Anesthesia Other Findings   Reproductive/Obstetrics                           Anesthesia Physical Anesthesia Plan  ASA: III  Anesthesia Plan: General   Post-op Pain Management:    Induction: Intravenous  Airway Management Planned: Oral ETT  Additional Equipment:   Intra-op Plan:   Post-operative Plan: Extubation in OR  Informed Consent: I have reviewed the patients History and Physical, chart, labs and discussed the procedure including the risks, benefits and alternatives for the proposed anesthesia with the patient or authorized representative who has indicated his/her understanding and acceptance.   Dental advisory given  Plan Discussed with: CRNA, Anesthesiologist and Surgeon  Anesthesia Plan Comments:         Anesthesia Quick Evaluation

## 2013-06-23 NOTE — Anesthesia Procedure Notes (Addendum)
Anesthesia Regional Block:  Interscalene brachial plexus block  Pre-Anesthetic Checklist: ,, timeout performed, Correct Patient, Correct Site, Correct Laterality, Correct Procedure, Correct Position, site marked, Risks and benefits discussed,  Surgical consent,  Pre-op evaluation,  At surgeon's request and post-op pain management  Laterality: Left and Upper  Prep: chloraprep       Needles:  Injection technique: Single-shot  Needle Type: Echogenic Needle     Needle Length: 5cm 5 cm Needle Gauge: 21 and 21 G    Additional Needles:  Procedures: ultrasound guided (picture in chart) Interscalene brachial plexus block Narrative:  Start time: 06/23/2013 8:46 AM End time: 06/23/2013 8:52 AM Injection made incrementally with aspirations every 5 mL.  Performed by: Personally  Anesthesiologist: Lorrene Reid, MD   Procedure Name: Intubation Date/Time: 06/23/2013 9:35 AM Performed by: Baxter Flattery Pre-anesthesia Checklist: Patient identified, Emergency Drugs available, Suction available and Patient being monitored Patient Re-evaluated:Patient Re-evaluated prior to inductionOxygen Delivery Method: Circle System Utilized Preoxygenation: Pre-oxygenation with 100% oxygen Intubation Type: IV induction Ventilation: Mask ventilation without difficulty Laryngoscope Size: Miller and 2 Grade View: Grade I Tube type: Oral Number of attempts: 1 Airway Equipment and Method: stylet and oral airway Placement Confirmation: ETT inserted through vocal cords under direct vision,  positive ETCO2 and breath sounds checked- equal and bilateral Secured at: 22 cm Tube secured with: Tape Dental Injury: Teeth and Oropharynx as per pre-operative assessment

## 2013-06-23 NOTE — Discharge Instructions (Signed)
°  Post Anesthesia Home Care Instructions  Activity: Get plenty of rest for the remainder of the day. A responsible adult should stay with you for 24 hours following the procedure.  For the next 24 hours, DO NOT: -Drive a car -Paediatric nurse -Drink alcoholic beverages -Take any medication unless instructed by your physician -Make any legal decisions or sign important papers.  Meals: Start with liquid foods such as gelatin or soup. Progress to regular foods as tolerated. Avoid greasy, spicy, heavy foods. If nausea and/or vomiting occur, drink only clear liquids until the nausea and/or vomiting subsides. Call your physician if vomiting continues.  Special Instructions/Symptoms: Your throat may feel dry or sore from the anesthesia or the breathing tube placed in your throat during surgery. If this causes discomfort, gargle with warm salt water. The discomfort should disappear within 24 hours.   Regional Anesthesia Blocks  1. Numbness or the inability to move the "blocked" extremity may last from 3-48 hours after placement. The length of time depends on the medication injected and your individual response to the medication. If the numbness is not going away after 48 hours, call your surgeon.  2. The extremity that is blocked will need to be protected until the numbness is gone and the  Strength has returned. Because you cannot feel it, you will need to take extra care to avoid injury. Because it may be weak, you may have difficulty moving it or using it. You may not know what position it is in without looking at it while the block is in effect.  3. For blocks in the legs and feet, returning to weight bearing and walking needs to be done carefully. You will need to wait until the numbness is entirely gone and the strength has returned. You should be able to move your leg and foot normally before you try and bear weight or walk. You will need someone to be with you when you first try to ensure  you do not fall and possibly risk injury.  4. Bruising and tenderness at the needle site are common side effects and will resolve in a few days.  5. Persistent numbness or new problems with movement should be communicated to the surgeon or the [facility] [phone] [place] [phone]).   Call your surgeon if you experience:   1.  Fever over 101.0. 2.  Inability to urinate. 3.  Nausea and/or vomiting. 4.  Extreme swelling or bruising at the surgical site. 5.  Continued bleeding from the incision. 6.  Increased pain, redness or drainage from the incision. 7.  Problems related to your pain medication.

## 2013-06-23 NOTE — Anesthesia Postprocedure Evaluation (Signed)
  Anesthesia Post-op Note  Patient: Amy Tran  Procedure(s) Performed: Procedure(s): LEFT SHOULDER ARTHROSCOPY WITH DEBRIDEMENT ROTATOR CUFF AND LABRUM (Left)  Patient Location: PACU  Anesthesia Type:GA combined with regional for post-op pain  Level of Consciousness: awake, alert  and oriented  Airway and Oxygen Therapy: Patient Spontanous Breathing and Patient connected to face mask oxygen  Post-op Pain: none  Post-op Assessment: Post-op Vital signs reviewed  Post-op Vital Signs: Reviewed  Last Vitals:  Filed Vitals:   06/23/13 1030  BP: 121/69  Pulse: 60  Temp:   Resp: 16    Complications: No apparent anesthesia complications

## 2013-06-23 NOTE — Interval H&P Note (Signed)
History and Physical Interval Note:  06/23/2013 9:05 AM  Amy Tran  has presented today for surgery, with the diagnosis of Left Shoulder: Disorder Articular Cartilage - Shoulder, Impingement Syndrome - Shoulder, Impingement Syndrome - Shoulder  The various methods of treatment have been discussed with the patient and family. After consideration of risks, benefits and other options for treatment, the patient has consented to  Procedure(s): LEFT SHOULDER ARTHROSCOPY WITH EXTENSIVE DEBRIDEMENT, DISTAL CLAVICULECTOMY, SUBACROMIAL DECOMPRESSION, PARTIAL ACROMIOPLASTY WITH CORACOACROMIAL RELEASE (Left) as a surgical intervention .  The patient's history has been reviewed, patient examined, no change in status, stable for surgery.  I have reviewed the patient's chart and labs.  Questions were answered to the patient's satisfaction.     Lorn Junes

## 2013-06-23 NOTE — Transfer of Care (Signed)
Immediate Anesthesia Transfer of Care Note  Patient: Amy Tran  Procedure(s) Performed: Procedure(s): LEFT SHOULDER ARTHROSCOPY WITH EXTENSIVE DEBRIDEMENT, DISTAL CLAVICULECTOMY, SUBACROMIAL DECOMPRESSION, PARTIAL ACROMIOPLASTY WITH CORACOACROMIAL RELEASE (Left)  Patient Location: PACU  Anesthesia Type:GA combined with regional for post-op pain  Level of Consciousness: awake, alert  and oriented  Airway & Oxygen Therapy: Patient Spontanous Breathing and Patient connected to face mask oxygen  Post-op Assessment: Report given to PACU RN, Post -op Vital signs reviewed and stable and Patient moving all extremities  Post vital signs: Reviewed and stable  Complications: No apparent anesthesia complications

## 2013-06-24 ENCOUNTER — Ambulatory Visit (HOSPITAL_COMMUNITY)
Admission: RE | Admit: 2013-06-24 | Discharge: 2013-06-24 | Disposition: A | Discharge status: Home / Self Care | Payer: PRIVATE HEALTH INSURANCE | Source: Ambulatory Visit | Attending: Orthopedic Surgery | Admitting: Orthopedic Surgery

## 2013-06-24 ENCOUNTER — Encounter (HOSPITAL_BASED_OUTPATIENT_CLINIC_OR_DEPARTMENT_OTHER): Payer: Self-pay | Admitting: Orthopedic Surgery

## 2013-06-24 DIAGNOSIS — M25519 Pain in unspecified shoulder: Secondary | ICD-10-CM | POA: Insufficient documentation

## 2013-06-24 DIAGNOSIS — IMO0001 Reserved for inherently not codable concepts without codable children: Secondary | ICD-10-CM | POA: Insufficient documentation

## 2013-06-24 DIAGNOSIS — E119 Type 2 diabetes mellitus without complications: Secondary | ICD-10-CM | POA: Insufficient documentation

## 2013-06-24 DIAGNOSIS — I10 Essential (primary) hypertension: Secondary | ICD-10-CM | POA: Diagnosis not present

## 2013-06-24 DIAGNOSIS — M25619 Stiffness of unspecified shoulder, not elsewhere classified: Secondary | ICD-10-CM | POA: Diagnosis not present

## 2013-06-24 DIAGNOSIS — M6281 Muscle weakness (generalized): Secondary | ICD-10-CM | POA: Insufficient documentation

## 2013-06-24 NOTE — Evaluation (Signed)
Occupational Therapy Evaluation  Patient Details  Name: Amy Tran MRN: [id number] Date of Birth: 1955-10-01  Today's Date: 06/24/2013 Time: 0254-2706 OT Time Calculation (min): 30 min OT eval 1345-1415 30'  Visit#: 1 of 24  Re-eval: 07/22/13  Assessment Diagnosis: s/p partial RCT Left shoulder Surgical Date: 06/23/13 Next MD Visit: 06/29/13 Dr. Noemi Chapel Prior Therapy: Rotator cufff repair left shoulder 1999  Authorization: [facility] medicare  Authorization Time Period: before 10th visit  Authorization Visit#: 1 of 10   Past Medical History:  Past Medical History  Diagnosis Date  . Hypertension   . Fluid retention   . Diabetes mellitus without complication   . PONV (postoperative nausea and vomiting)   . Wears glasses   . Wears partial dentures     bottom  . Incomplete rotator cuff tear    Past Surgical History:  Past Surgical History  Procedure Laterality Date  . Gastric bypass  2004  . Carpal tunnel release  2001    left  . Cholecystectomy    . Neck surgery  1998  . Shoulder surgery  1999    left  . Ercp  2002  . Colonoscopy  2007  . Appendectomy    . Shoulder arthroscopy with subacromial decompression Left 06/23/2013    Procedure: LEFT SHOULDER ARTHROSCOPY WITH DEBRIDEMENT ROTATOR CUFF AND LABRUM;  Surgeon: Lorn Junes, MD;  Location: [facility];  Service: Orthopedics;  Laterality: Left;    Subjective Symptoms/Limitations Symptoms: S: I just had surgery yesterday Pertinent History: In 1999, Amy Tran had rotator cuff repair to her left shoulder. About a month ago, she began to experience pain in her left shoulder. Rotator cuff repair surgery was completed 06/23/13. Dr. Noemi Chapel has referred patient to occupational therapy for evaluartion and treatment.  Limitations: Using her left arm for daily tasks, brushing hair, getting dressed, raising her arm overhead.  Patient Stated Goals: To get back to work and use her arm as normal as  possible. Pain Assessment Currently in Pain?: No/denies  Precautions/Restrictions  Precautions Precautions: Shoulder Type of Shoulder Precautions: PROM x1week. Progress to [facility] then AROM as tolerated. Sling on 2-3 weeks.  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   Assessment ADL/Vision/Perception ADL ADL Comments: Pt is unable to complete any daily tasks with left arm. Difficulty with bathing, dressing, raising arm up overhead.  Dominant Hand: Right Vision - History Baseline Vision: Wears glasses all the time  Cognition/Observation Cognition Overall Cognitive Status: Within Functional Limits for tasks assessed Arousal/Alertness: Awake/alert Orientation Level: Oriented X4   Additional Assessments RUE Assessment RUE Assessment: Within Functional Limits RUE Strength Grip (lbs): 80 LUE PROM (degrees) LUE Overall PROM Comments: assessed supine. IR/ER adducted Left Shoulder Flexion: 120 Degrees Left Shoulder ABduction: 87 Degrees Left Shoulder Internal Rotation: 90 Degrees Left Shoulder External Rotation: 33 Degrees LUE Strength LUE Overall Strength Comments: not tested due to precautions Grip (lbs): 62 Palpation Palpation: max fascial restrictions in left upper arm, trapezius, and scapularis region.  Occupational Therapy Assessment and Plan OT Assessment and Plan Clinical  Impression Statement: A: Patient is a 58 y/o female s/p left shoulder partial RCT resulting in decrease range of motion, increased fascial restrictions, pain, and fascial restrictions resulting in difficulty completing B/IADL and work activities.  Pt will benefit from skilled therapeutic intervention in order to improve on the following deficits: Increased fascial restricitons;Pain;Impaired UE functional use;Decreased strength;Decreased range of motion;Increased edema Rehab Potential: Excellent OT Frequency: Min 2X/week OT Duration: 12 weeks OT Treatment/Interventions: Self-care/ADL training;Therapeutic exercise;DME and/or AE instruction;Manual therapy;Patient/family education;Therapeutic activities;Modalities OT Plan: P: Pt will benefit from skilled OT services to increase PROM and AROM, decreased pain, increase strength, decrease fascial restrions and edema, and improve overall ADL/IADL status. Treatment Plan: MFR, PROM, AAROM, AROM, strengthening, proximla stability    Goals Short Term Goals Time to Complete Short Term Goals: 6 weeks Short Term Goal 1: Patient will be educated on HEP. Short Term Goal 2: Patient will report pain in left shoulder at a 5/10 when completing daily tasks.  Short Term Goal 3: Patient will increase PROM of Left shoulder to [facility] to increase ability to get dressed.  Short Term Goal 4: Patient will increase Left shoulder strength to 3/5 to increase ability to raise arm overhead during daily tasks.  Short Term Goal 5: Patient will decrease fascial restrictions in left arm from max to mod Long Term Goals Time to Complete Long Term Goals: 12 weeks Long Term Goal 1: Patient will return to highest level of independence with all B/IADL, work, and leisure tasks.  Long Term Goal 2: Patient will report pain in left shoulder at a 3/10 when completing daily tasks.  Long Term Goal 3: Patient will increase AROM of Left shoulder to WNL to increase ability to get dressed.  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 Long Term Goal 5: Patient will decrease fascial restrictions from a mod to trace.  Problem List Patient Active Problem List   Diagnosis Date Noted  . Pain in joint, shoulder region 06/24/2013  . Muscle weakness (generalized) 06/24/2013  . Incomplete rotator cuff tear   . Diabetes mellitus without complication   . PONV (postoperative nausea and vomiting)   . Fluid retention   . Hypertension     End of Session Activity Tolerance: Patient tolerated treatment well General Behavior During Therapy: WFL for tasks assessed/performed OT Plan of Care OT Home Exercise Plan: Towel table slides - Flexion only OT Patient Instructions: handout Consulted and Agree with Plan of Care: Patient  GO Functional Assessment Tool Used: FOTO score: 13/100 (87% impaired) Functional Limitation: Carrying, moving and handling objects Carrying, Moving and Handling Objects Current Status ([id number]): At least 80 percent but less than 100 percent impaired, limited or restricted Carrying, Moving and Handling Objects Goal Status [phone]): At least 20 percent but less than 40 percent impaired, limited or restricted  Ailene Ravel, OTR/L,CBIS   06/24/2013, 4:56 PM  Physician Documentation Your signature is required to indicate approval of the treatment plan as stated above.  Please sign and either send electronically or make a copy of this report for your files and return this physician signed original.  Please mark one 1.__approve of plan  2. ___approve of plan with the following conditions.   ______________________________  _____________________ Physician Signature                                                                                                             Date

## 2013-06-24 NOTE — Op Note (Signed)
Amy Tran, Amy Tran             ACCOUNT NO.:  [id number]  MEDICAL RECORD NO.:  [id number]  LOCATION:                                 FACILITY:  PHYSICIAN:  Jary Louvier A. Noemi Chapel, M.D. DATE OF BIRTH:  1956-01-12  DATE OF PROCEDURE:  06/23/2013 DATE OF DISCHARGE:  06/23/2013                              OPERATIVE REPORT   PREOPERATIVE DIAGNOSES: 1. Left shoulder partial rotator cuff tear with partial labrum tear. 2. Left shoulder bursitis.  POSTOPERATIVE DIAGNOSES: 1. Left shoulder partial rotator cuff tear with partial labrum tear. 2. Left shoulder bursitis.  PROCEDURES: 1. Left shoulder EUA followed by arthroscopic debridement of partial     rotator cuff tear and partial labrum tear. 2. Left shoulder bursectomy.  SURGEON:  Audree Camel. Noemi Chapel, M.D.  ASSISTANT:  Matthew Saras, PA-C  ANESTHESIA:  General.  OPERATIVE TIME:  45 minutes.  COMPLICATIONS:  None.  INDICATION FOR PROCEDURE:  Amy Tran is a 58 year old woman who has had 6-9 months of increasing left shoulder pain with exam and MRI documenting partial rotator cuff tear and partial labrum tear, also bursitis.  She has failed multiple conservative modalities and is now to undergo arthroscopy.  DESCRIPTION OF PROCEDURE:  Amy Tran was brought to the operating room on Jun 23, 2013, after an interscalene block was placed in holding room by Anesthesia.  She was placed on operating table in supine position. She received antibiotics preoperatively for prophylaxis.  After being placed under general anesthesia, her left shoulder was examined.  She had full range of motion in her shoulder with stable ligamentous exam. She was then placed in beach-chair position, and her shoulder and arm were prepped using sterile DuraPrep and draped using sterile technique. Time-out procedure was called and the correct left shoulder identified. Initially, through a posterior arthroscopic portal, the arthroscope with a pump attached  was placed into an anterior portal and arthroscopic probe was placed.  On initial inspection, the articular cartilage and glenohumeral joint showed grade 1-2 chondromalacia.  She had partial tearing of the anterior, superior, and posterior labrum and inferior labrum 25-30%, which was debrided.  The anterior-inferior glenohumeral ligament complex was intact.  Biceps tendon anchor was intact.  Biceps tendon showed partial tearing 20%, which was debrided.  The rotator cuff showed 40-50% partial tearing of the supraspinatus, which was debrided, but it was otherwise well attached.  The infraspinatus 30% tearing, which was debrided.  Subscapularis 20%, which was debrided.  The rest of the rotator cuff was intact.  The inferior capsular recess free of pathology.  Subacromial space was entered and a lateral arthroscopic portal was made.  Moderately thickened bursitis was resected.  The rotator cuff was somewhat inflamed on the bursal surface, but no evidence of a tear.  She had previously undergone a subacromial decompression and distal clavicle excision many years ago and this did not need to be revised, but a subtotal bursectomy was carried out. After this was done, the shoulder could be brought through a full range of motion with no impingement on the rotator cuff.  At this point, it was felt that all pathology had been satisfactorily addressed.  The instruments were removed.  Portals were closed with  3-0 nylon suture. Sterile dressings were applied, and the patient awakened and taken to the recovery room in stable condition.  FOLLOWUP CARE:  Amy Tran will be followed as an outpatient on OxyIR and Valium with early physical therapy.  She will be seen back in the office in a week for sutures out and followup.     Jessiah Steinhart A. Noemi Chapel, M.D.   ______________________________ Audree Camel. Noemi Chapel, M.D.    RAW/MEDQ  D:  06/23/2013  T:  06/24/2013  Job:  [id number]

## 2013-06-30 ENCOUNTER — Ambulatory Visit (HOSPITAL_COMMUNITY)
Admission: RE | Admit: 2013-06-30 | Discharge: 2013-06-30 | Disposition: A | Discharge status: Home / Self Care | Payer: PRIVATE HEALTH INSURANCE | Source: Ambulatory Visit | Attending: Orthopedic Surgery | Admitting: Orthopedic Surgery

## 2013-06-30 DIAGNOSIS — IMO0001 Reserved for inherently not codable concepts without codable children: Secondary | ICD-10-CM | POA: Diagnosis not present

## 2013-06-30 NOTE — Progress Notes (Signed)
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  Pain Assessment Currently in Pain?: No/denies  Precautions/Restrictions  Precautions Precautions: Shoulder Type of Shoulder Precautions: PROM x1week. Progress to [facility] then AROM as tolerated. Sling on 2-3 weeks.   Exercise/Treatments Supine Protraction: PROM;10 reps Horizontal ABduction: PROM;10 reps External Rotation: PROM;10 reps Internal Rotation: PROM;10 reps Flexion: PROM;10 reps ABduction: PROM;10 reps Seated Elevation: AROM;12 reps Extension: AROM;12 reps Row: AROM;10 reps Therapy Ball Flexion: 15 reps ABduction: 15 reps    Manual Therapy Manual Therapy: Myofascial release Myofascial Release: MFR and manual stretching to left upper arm, trapezius, and scapularis region to decrease fascial restrictions and increase joint mobility in a pain free zone.   Occupational Therapy Assessment and Plan OT Assessment and Plan Clinical Impression Statement: A: Patient had stitches and bandages removed yesterday from MD. Dr. Leta Baptist informed patient that she only needs to wear sling at work. Patient is returning to work Architectural technologist. Patient completed all exercises with no pain.  Patient was educated to complete all towel slide exercises on handout. Reviewed with patient.  OT Plan: P: Cont. PROM through this week and progress to [facility] next week. Follow up on towel slides HEP.   Goals Short Term Goals Time to Complete Short Term Goals: 6 weeks Short Term Goal 1: Patient will be educated on  HEP. Short Term Goal 1 Progress: Progressing toward goal Short Term Goal 2: Patient will report pain in left shoulder at a 5/10 when completing daily tasks.  Short Term Goal 2 Progress: Progressing toward goal Short Term Goal 3: Patient will increase PROM of Left shoulder to [facility] to increase ability to get dressed.  Short Term Goal 3 Progress: Progressing toward goal Short Term Goal 4: Patient will increase Left shoulder strength to 3/5 to increase ability to raise arm overhead during daily tasks.  Short Term Goal 4 Progress: Progressing toward goal Short Term Goal 5: Patient will decrease fascial restrictions in left arm from max to mod Short Term Goal 5 Progress: Progressing toward goal Long Term Goals Time to Complete Long Term Goals: 12 weeks Long Term Goal 1: Patient will return to highest level of independence with all B/IADL, work, and leisure tasks.  Long Term Goal 1 Progress: Progressing toward goal Long Term Goal 2: Patient will report pain in left shoulder at a 3/10 when completing daily tasks.  Long Term Goal 2 Progress: Progressing toward goal Long Term Goal 3: Patient will increase AROM of Left shoulder to WNL to increase ability to get dressed.  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 Long Term Goal 4 Progress: Progressing toward goal Long Term Goal 5: Patient will decrease fascial restrictions from a mod to trace. Long Term Goal 5 Progress: Progressing toward goal  Problem List Patient Active Problem List   Diagnosis Date Noted  . Pain in joint, shoulder region 06/24/2013  . Muscle weakness (generalized) 06/24/2013  . Incomplete rotator cuff tear   .  Diabetes mellitus without complication   . PONV (postoperative nausea and vomiting)   . Fluid retention   . Hypertension     End of Session Activity Tolerance: Patient tolerated  treatment well General Behavior During Therapy: [facility] for tasks assessed/performed   Ailene Ravel, OTR/L,CBIS   06/30/2013, 2:59 PM

## 2013-07-02 ENCOUNTER — Ambulatory Visit (HOSPITAL_COMMUNITY)
Admission: RE | Admit: 2013-07-02 | Discharge: 2013-07-02 | Disposition: A | Discharge status: Home / Self Care | Payer: PRIVATE HEALTH INSURANCE | Source: Ambulatory Visit | Attending: Internal Medicine | Admitting: Internal Medicine

## 2013-07-02 DIAGNOSIS — M25519 Pain in unspecified shoulder: Secondary | ICD-10-CM

## 2013-07-02 DIAGNOSIS — IMO0001 Reserved for inherently not codable concepts without codable children: Secondary | ICD-10-CM | POA: Diagnosis not present

## 2013-07-02 DIAGNOSIS — M6281 Muscle weakness (generalized): Secondary | ICD-10-CM

## 2013-07-02 NOTE — Progress Notes (Signed)
Occupational Therapy Treatment Patient Details  Name: Amy Tran MRN: [id number] Date of Birth: [birth year]  Today's Date: 07/02/2013 Time: 5093-2671 OT Time Calculation (min): 34 min Manual therapy 2458-0998 14' Therapeutic exercises [phone] 20' Visit#: 3 of 24  Re-eval: 07/22/13     Subjective Symptoms/Limitations Symptoms: S:  Its hard being at work on light duty.   Limitations: PROM x1week. Progress to [facility] then AROM as tolerated. Sling on 2-3 weeks.  Pain Assessment Currently in Pain?: No/denies Pain Score: 0-No pain  Precautions/Restrictions  Precautions Type of Shoulder Precautions: PROM x1week. Progress to [facility] then AROM as tolerated. Sling on 2-3 weeks.   Exercise/Treatments Supine Protraction: PROM;10 reps Horizontal ABduction: PROM;10 reps External Rotation: PROM;10 reps Internal Rotation: PROM;10 reps Flexion: PROM;10 reps ABduction: PROM;10 reps Seated Elevation: AROM;15 reps Extension: AROM;15 reps Row: AROM;15 reps Other Seated Exercises: 15 times each 1# elbow flexion, extension, supination, pronation, wrist flexion extension 10 times each   Therapy Ball Flexion: 20 reps ABduction: 20 reps ROM / Strengthening / Isometric Strengthening Thumb Tacks: 1' Prot/Ret//Elev/Dep: 1' Flexion: Supine;3X5" Extension: Supine;3X5" External Rotation: Supine;3X5" Internal Rotation: Supine;3X5" ABduction: Supine;3X5" ADduction: Supine;3X5"    Manual Therapy Manual Therapy: Myofascial release Myofascial Release: MFR and manual stretching to left upper arm, trapezius, and scapularis region to decrease fascial restrictions and increase joint mobility in a pain free zone.   Occupational Therapy Assessment and Plan OT Assessment and Plan Clinical Impression Statement: A:  added isometrics for strengthening.   OT Plan: P:  Begin AAROM in supine.    Goals Short Term Goals Time to Complete Short Term Goals: 6 weeks Short Term Goal 1: Patient will be  educated on HEP. Short Term Goal 2: Patient will report pain in left shoulder at a 5/10 when completing daily tasks.  Short Term Goal 3: Patient will increase PROM of Left shoulder to [facility] to increase ability to get dressed.  Short Term Goal 4: Patient will increase Left shoulder strength to 3/5 to increase ability to raise arm overhead during daily tasks.  Short Term Goal 5: Patient will decrease fascial restrictions in left arm from max to mod Long Term Goals Time to Complete Long Term Goals: 12 weeks Long Term Goal 1: Patient will return to highest level of independence with all B/IADL, work, and leisure tasks.  Long Term Goal 2: Patient will report pain in left shoulder at a 3/10 when completing daily tasks.  Long Term Goal 3: Patient will increase AROM of Left shoulder to WNL to increase ability to get dressed.  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 Long Term Goal 5: Patient will decrease fascial restrictions from a mod to trace.  Problem List Patient Active Problem List   Diagnosis Date Noted  . Pain in joint, shoulder region 06/24/2013  . Muscle weakness (generalized) 06/24/2013  . Incomplete rotator cuff tear   . Diabetes mellitus without complication   . PONV (postoperative nausea and vomiting)   . Fluid retention   . Hypertension     End of Session Activity Tolerance: Patient tolerated treatment well General Behavior During Therapy: [facility] for tasks assessed/performed  GO    Vangie Bicker, OTR/L [phone]  07/02/2013, 3:34 PM

## 2013-07-07 ENCOUNTER — Ambulatory Visit (HOSPITAL_COMMUNITY)
Admission: RE | Admit: 2013-07-07 | Discharge: 2013-07-07 | Disposition: A | Discharge status: Home / Self Care | Payer: PRIVATE HEALTH INSURANCE | Source: Ambulatory Visit | Attending: Orthopedic Surgery | Admitting: Orthopedic Surgery

## 2013-07-07 DIAGNOSIS — IMO0001 Reserved for inherently not codable concepts without codable children: Secondary | ICD-10-CM | POA: Diagnosis not present

## 2013-07-07 NOTE — Progress Notes (Signed)
Occupational Therapy Treatment Patient Details  Name: Amy Tran MRN: [id number] Date of Birth: 05/26/1955  Today's Date: 07/07/2013 Time: 4536-4680 OT Time Calculation (min): 45 min MFR 3212-2482 8' Therex 5003-7048 37'  Visit#: 4 of 24  Re-eval: 07/22/13    Authorization: [employer] medicare  Authorization Time Period: before 10th visit  Authorization Visit#: 4 of 10  Subjective Symptoms/Limitations Symptoms: S: I've been doing the exercises that you gave me every day.  Pain Assessment Currently in Pain?: No/denies  Precautions/Restrictions  Precautions Precautions: Shoulder Type of Shoulder Precautions: PROM x1week. Progress to [facility] then AROM as tolerated. Sling on 2-3 weeks.   Exercise/Treatments Supine Protraction: PROM;AAROM;10 reps Horizontal ABduction: PROM;AAROM;10 reps External Rotation: PROM;AAROM;10 reps Internal Rotation: PROM;AAROM;10 reps Flexion: PROM;AAROM;10 reps ABduction: PROM;AAROM;10 reps Seated Elevation: AROM;15 reps Extension: AROM;15 reps Row: AROM;15 reps Protraction: AAROM;10 reps Horizontal ABduction: AAROM;10 reps External Rotation: AAROM;10 reps Internal Rotation: AAROM;10 reps Flexion: AAROM;10 reps Abduction: AAROM;10 reps Other Seated Exercises: 15 times each 1# elbow flexion, extension, supination, pronation, wrist flexion extension  Pulleys Flexion: 1 minute ABduction: 1 minute ROM / Strengthening / Isometric Strengthening Thumb Tacks: 1' Prot/Ret//Elev/Dep: 1'      Manual Therapy Manual Therapy: Myofascial release Myofascial Release: MFR and manual stretching to left upper arm, trapezius, and scapularis region to decrease fascial restrictions and increase joint mobility in a pain free zone.   Occupational Therapy Assessment and Plan OT Assessment and Plan Clinical Impression Statement: A: Added AAROM supine and seated. Added pulleys. Patient tolerated well with no pain.  OT Plan: P: Increase reps with AAROM.    Goals Short Term Goals Time to Complete Short Term Goals: 6 weeks Short Term Goal 1: Patient will be educated on HEP. Short Term Goal 1 Progress: Progressing toward goal Short Term Goal 2: Patient will report pain in left shoulder at a 5/10 when completing daily tasks.  Short Term Goal 2 Progress: Progressing toward goal Short Term Goal 3: Patient will increase PROM of Left shoulder to [facility] to increase ability to get dressed.  Short Term Goal 3 Progress: Progressing toward goal Short Term Goal 4: Patient will increase Left shoulder strength to 3/5 to increase ability to raise arm overhead during daily tasks.  Short Term Goal 4 Progress: Progressing toward goal Short Term Goal 5: Patient will decrease fascial restrictions in left arm from max to mod Short Term Goal 5 Progress: Progressing toward goal Long Term Goals Time to Complete Long Term Goals: 12 weeks Long Term Goal 1: Patient will return to highest level of independence with all B/IADL, work, and leisure tasks.  Long Term Goal 1 Progress: Progressing toward goal Long Term Goal 2: Patient will report pain in left shoulder at a 3/10 when completing daily tasks.  Long Term Goal 2 Progress: Progressing toward goal Long Term Goal 3: Patient will increase AROM of Left shoulder to WNL to increase ability to get dressed.  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 Long Term Goal 4 Progress: Progressing toward goal Long Term Goal 5: Patient will decrease fascial restrictions from a mod to trace. Long Term Goal 5 Progress: Progressing toward goal  Problem List Patient Active Problem List   Diagnosis Date Noted  . Pain in joint, shoulder region 06/24/2013  . Muscle weakness (generalized) 06/24/2013  . Incomplete rotator cuff tear   . Diabetes mellitus without complication   .  PONV (postoperative nausea  and vomiting)   . Fluid retention   . Hypertension     End of Session Activity Tolerance: Patient tolerated treatment well General Behavior During Therapy: [facility] for tasks assessed/performed      Ailene Ravel, OTR/L,CBIS   07/07/2013, 3:59 PM

## 2013-07-09 ENCOUNTER — Ambulatory Visit (HOSPITAL_COMMUNITY)
Admission: RE | Admit: 2013-07-09 | Discharge: 2013-07-09 | Disposition: A | Discharge status: Home / Self Care | Payer: PRIVATE HEALTH INSURANCE | Source: Ambulatory Visit | Attending: Internal Medicine | Admitting: Internal Medicine

## 2013-07-09 ENCOUNTER — Ambulatory Visit (HOSPITAL_COMMUNITY): Payer: PRIVATE HEALTH INSURANCE | Admitting: Specialist

## 2013-07-09 DIAGNOSIS — IMO0001 Reserved for inherently not codable concepts without codable children: Secondary | ICD-10-CM | POA: Diagnosis not present

## 2013-07-09 NOTE — Progress Notes (Signed)
Occupational Therapy Treatment Patient Details  Name: Amy Tran MRN: [id number] Date of Birth: [birth year]  Today's Date: 07/09/2013 Time: 2725-3664 OT Time Calculation (min): 45 min Manual therapy 4034-7425 23' Therapeutic exercises 1129-1151 22' Visit#: 5 of 24  Re-eval: 07/22/13    Authorization: [employer] medicare  Authorization Time Period: before 10th visit  Authorization Visit#: 5 of 10  Subjective S:  Im a little sore, I have some pain in my neck now.  (OT discussed trapezius muscle with patient.) Limitations: PROM x1week. Progress to [facility] then AROM as tolerated. Sling on 2-3 weeks.  Pain Assessment Currently in Pain?: Yes Pain Score: 5  Pain Location: Shoulder Pain Orientation: Left Pain Type: Acute pain  Precautions/Restrictions    PROM x1week. Progress to [facility] then AROM as tolerated. Sling on 2-3 weeks.    Exercise/Treatments Supine Protraction: PROM;10 reps;AAROM;12 reps Horizontal ABduction: PROM;10 reps;AAROM;12 reps External Rotation: PROM;10 reps;AAROM;12 reps Internal Rotation: PROM;10 reps;AAROM;12 reps Flexion: PROM;10 reps;AAROM;12 reps ABduction: PROM;10 reps;AAROM;12 reps Standing Protraction: AAROM;12 reps Horizontal ABduction: AAROM;12 reps External Rotation: AAROM;12 reps Internal Rotation: AAROM;12 reps Flexion: AAROM;12 reps ABduction: AAROM;12 reps Extension: Theraband;10 reps Theraband Level (Shoulder Extension): Level 2 (Red) Row: Theraband;10 reps Theraband Level (Shoulder Row): Level 2 (Red) Retraction: Theraband;10 reps Theraband Level (Shoulder Retraction): Level 2 (Red) Pulleys   Therapy Ball Flexion: 25 reps ABduction: 25 reps Right/Left: 5 reps ROM / Strengthening / Isometric Strengthening     Stretches   [employer] Therapy Manual Therapy: Myofascial release Myofascial Release: MFR and manual stretching to left upper arm, trapezius, and scapularis region to decrease  fascial restrictions and increase joint mobility in a pain free zone.Added manual cervical traction MFR and manual stretching to SCM, upper trapezius, and cervical region, and to teres minor/ latissimus region.   Occupational Therapy Assessment and Plan OT Assessment and Plan Clinical Impression Statement: A:  Added scapular theraband for increase shoulder stability for return to normal activities. OT Plan: P:  Attempt AROM in supine.    Goals Short Term Goals Time to Complete Short Term Goals: 6 weeks Short Term Goal 1: Patient will be educated on HEP. Short Term Goal 2: Patient will report pain in left shoulder at a 5/10 when completing daily tasks.  Short Term Goal 3: Patient will increase PROM of Left shoulder to [facility] to increase ability to get dressed.  Short Term Goal 4: Patient will increase Left shoulder strength to 3/5 to increase ability to raise arm overhead during daily tasks.  Short Term Goal 5: Patient will decrease fascial restrictions in left arm from max to mod Long Term Goals Time to Complete Long Term Goals: 12 weeks Long Term Goal 1: Patient will return to highest level of independence with all B/IADL, work, and leisure tasks.  Long Term Goal 2: Patient will report pain in left shoulder at a 3/10 when completing daily tasks.  Long Term Goal 3: Patient will increase AROM of Left shoulder to WNL to increase ability to get dressed.  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 Long Term Goal 5: Patient will decrease fascial restrictions from a mod to trace.  Problem List Patient Active Problem List   Diagnosis Date Noted  . Pain in joint, shoulder region 06/24/2013  . Muscle weakness (generalized) 06/24/2013  . Incomplete rotator cuff tear   .  Diabetes mellitus without complication   . PONV (postoperative nausea and vomiting)   . Fluid retention   . Hypertension     End of  Session Activity Tolerance: Patient tolerated treatment well General Behavior During Therapy: [facility] for tasks assessed/performed  GO    Arbutus Ped 07/09/2013, 11:56 AM

## 2013-07-14 ENCOUNTER — Ambulatory Visit (HOSPITAL_COMMUNITY)
Admission: RE | Admit: 2013-07-14 | Discharge: 2013-07-14 | Disposition: A | Discharge status: Home / Self Care | Payer: PRIVATE HEALTH INSURANCE | Source: Ambulatory Visit | Attending: Internal Medicine | Admitting: Internal Medicine

## 2013-07-14 DIAGNOSIS — M25519 Pain in unspecified shoulder: Secondary | ICD-10-CM | POA: Diagnosis not present

## 2013-07-14 DIAGNOSIS — M6281 Muscle weakness (generalized): Secondary | ICD-10-CM | POA: Insufficient documentation

## 2013-07-14 DIAGNOSIS — E119 Type 2 diabetes mellitus without complications: Secondary | ICD-10-CM | POA: Insufficient documentation

## 2013-07-14 DIAGNOSIS — IMO0001 Reserved for inherently not codable concepts without codable children: Secondary | ICD-10-CM | POA: Diagnosis present

## 2013-07-14 DIAGNOSIS — M25619 Stiffness of unspecified shoulder, not elsewhere classified: Secondary | ICD-10-CM | POA: Diagnosis not present

## 2013-07-14 DIAGNOSIS — I10 Essential (primary) hypertension: Secondary | ICD-10-CM | POA: Insufficient documentation

## 2013-07-14 NOTE — Progress Notes (Signed)
Occupational Therapy Treatment Patient Details  Name: Amy Tran MRN: [id number] Date of Birth: [birth year]  Today's Date: 07/14/2013 Time: 7741-2878 OT Time Calculation (min): 39 min Manual 6767-2094 (15') Therapeutic Exercises 1453-1517 (24')  Visit#: 6 of 24  Re-eval: 07/22/13    Authorization: [employer] medicare  Authorization Time Period: before 10th visit  Authorization Visit#: 6 of 10  Subjective Symptoms/Limitations Symptoms: "I feel all right" Pain Assessment Currently in Pain?: No/denies   Exercise/Treatments Supine Protraction: PROM;AROM;10 reps Horizontal ABduction: PROM;AROM;10 reps External Rotation: PROM;AROM;10 reps Internal Rotation: PROM;AROM;10 reps Flexion: PROM;AROM;10 reps ABduction: PROM;AROM;10 reps Standing Protraction: AAROM;15 reps Horizontal ABduction: AAROM;15 reps External Rotation: AAROM;15 reps Internal Rotation: AAROM;15 reps Flexion: AAROM;15 reps ABduction: AAROM;15 reps Extension: Theraband;12 reps Theraband Level (Shoulder Extension): Level 2 (Red) Row: Theraband;12 reps Theraband Level (Shoulder Row): Level 2 (Red) Retraction: Theraband;12 reps Theraband Level (Shoulder Retraction): Level 2 (Red) ROM / Strengthening / Isometric Strengthening Wall Wash: 1:30 in flexion and abduction Proximal Shoulder Strengthening, Supine: 10x with no rest breaks    Manual Therapy Manual Therapy: Myofascial release Myofascial Release: MFR and manual stretching to left upper arm, trapezius, and scapularis region to decrease fascial restrictions and increase joint mobility in a pain free zone.    Occupational Therapy Assessment and Plan OT Assessment and Plan Clinical Impression Statement: Added supine AROM with good results and no pain. Increased standing AAROM reps with good tolerance and no fatigue. Added wall wash with no fatigue in flexion and abduction.   OT Plan: Re-Eval on Friday for Tues MD appointment. Add standing AROM.    Goals Short Term Goals Short Term Goal 1: Patient will be educated on HEP. Short Term Goal 1 Progress: Progressing toward goal Short Term Goal 2: Patient will report pain in left shoulder at a 5/10 when completing daily tasks.  Short Term Goal 2 Progress: Progressing toward goal Short Term Goal 3: Patient will increase PROM of Left shoulder to [facility] to increase ability to get dressed.  Short Term Goal 3 Progress: Progressing toward goal Short Term Goal 4: Patient will increase Left shoulder strength to 3/5 to increase ability to raise arm overhead during daily tasks.  Short Term Goal 4 Progress: Progressing toward goal Short Term Goal 5: Patient will decrease fascial restrictions in left arm from max to mod Short Term Goal 5 Progress: Progressing toward goal Long Term Goals Long Term Goal 1: Patient will return to highest level of independence with all B/IADL, work, and leisure tasks.  Long Term Goal 1 Progress: Progressing toward goal Long Term Goal 2: Patient will report pain in left shoulder at a 3/10 when completing daily tasks.  Long Term Goal 2 Progress: Progressing toward goal Long Term Goal 3: Patient will increase AROM of Left shoulder to WNL to increase ability to get dressed.  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 Long Term Goal 4 Progress: Progressing toward goal Long Term Goal 5: Patient will decrease fascial restrictions from a mod to trace. Long Term Goal 5 Progress: Progressing toward goal  Problem List Patient Active Problem List   Diagnosis Date Noted  . Pain in joint, shoulder region 06/24/2013  . Muscle weakness (generalized) 06/24/2013  . Incomplete rotator cuff tear   . Diabetes mellitus without complication   . PONV (postoperative nausea and vomiting)   . Fluid retention   . Hypertension  End of Session Activity  Tolerance: Patient tolerated treatment well General Behavior During Therapy: [facility] for tasks assessed/performed  GO    Bea Graff, [place], OTR/L [phone]  07/14/2013, 3:18 PM

## 2013-07-16 ENCOUNTER — Ambulatory Visit (HOSPITAL_COMMUNITY)
Admission: RE | Admit: 2013-07-16 | Discharge: 2013-07-16 | Disposition: A | Discharge status: Home / Self Care | Payer: PRIVATE HEALTH INSURANCE | Source: Ambulatory Visit | Attending: Internal Medicine | Admitting: Internal Medicine

## 2013-07-16 ENCOUNTER — Ambulatory Visit (HOSPITAL_COMMUNITY): Payer: PRIVATE HEALTH INSURANCE

## 2013-07-16 DIAGNOSIS — IMO0001 Reserved for inherently not codable concepts without codable children: Secondary | ICD-10-CM | POA: Diagnosis not present

## 2013-07-16 NOTE — Evaluation (Signed)
Occupational Therapy Discharge Summary Patient Details  Name: Amy Tran MRN: [id number] Date of Birth: 12-Mar-1955  Today's Date: 07/16/2013 Time: 4818-5631 OT Time Calculation (min): 26 min Manual therapy 4970-2637 17' MMT 8588-5027 9'  Visit#: 7 of 24  Re-eval: 07/16/13  Assessment Diagnosis: s/p partial RCT Left shoulder  Authorization: [facility] medicare  Authorization Time Period: before 10th visit  Authorization Visit#: 7 of   10  Past Medical History:  Past Medical History  Diagnosis Date  . Hypertension   . Fluid retention   . Diabetes mellitus without complication   . PONV (postoperative nausea and vomiting)   . Wears glasses   . Wears partial dentures     bottom  . Incomplete rotator cuff tear    Past Surgical History:  Past Surgical History  Procedure Laterality Date  . Gastric bypass  2004  . Carpal tunnel release  2001    left  . Cholecystectomy    . Neck surgery  1998  . Shoulder surgery  1999    left  . Ercp  2002  . Colonoscopy  2007  . Appendectomy    . Shoulder arthroscopy with subacromial decompression Left 06/23/2013    Procedure: LEFT SHOULDER ARTHROSCOPY WITH DEBRIDEMENT ROTATOR CUFF AND LABRUM;  Surgeon: Lorn Junes, MD;  Location: [place];  Service: Orthopedics;  Laterality: Left;    Subjective S:  I can do everything I want to do with my arm. Limitations: PROM x1week. Progress to [facility] then AROM as tolerated. Sling on 2-3 weeks.  Special Tests: FOTO 81/100 Pain Assessment Currently in Pain?: No/denies Pain Score: 0-No pain  Precautions/Restrictions   progress as tolerated  Assessment  Additional Assessments LUE AROM (degrees) LUE Overall AROM Comments: assessed in seated position ER/IR with shoulder abducted  Left Shoulder Flexion: 170 Degrees Left Shoulder ABduction: 165 Degrees Left Shoulder Internal Rotation: 90 Degrees Left Shoulder External Rotation: 90 Degrees LUE PROM (degrees) LUE Overall PROM  Comments: PROM is WNL LUE Strength LUE Overall Strength Comments: assessed in seated Left Shoulder Flexion: 5/5 Left Shoulder ABduction: 5/5 Left Shoulder Internal Rotation: 5/5 Left Shoulder External Rotation: 5/5 Grip (lbs): 75 (62)     Exercise/Treatments Supine Protraction: PROM;5 reps Horizontal ABduction: PROM;5 reps External Rotation: PROM;5 reps Internal Rotation: PROM;5 reps Flexion: PROM;5 reps ABduction: PROM;5 reps      Manual Therapy Manual Therapy: Myofascial release Myofascial Release: MFR and manual stretching to left upper arm, trapezius, and scapularis region to decrease fascial restrictions and increase joint mobility in a pain free zone.   Occupational Therapy Assessment and Plan OT Assessment and Plan Clinical Impression Statement: A:  Patient has met all OT goals and has returned to her prior level of function with all B/IADLS, work, and leisure activities.  Pateint ready for dc this date.  OT Plan: P:  DC from skilled OT intervention this date.    Goals Short Term Goals Short Term Goal 1: Patient will be educated on HEP. Short Term Goal 1 Progress: Met Short Term Goal 2: Patient will report pain in left shoulder at a 5/10 when completing daily tasks.  Short Term Goal 2 Progress: Met Short Term Goal 3: Patient will increase PROM of Left shoulder to [facility] to increase ability to get dressed.  Short Term Goal 3 Progress: Met Short Term Goal 4: Patient will increase Left shoulder strength to 3/5 to increase ability to raise arm overhead during daily tasks.  Short Term Goal 4 Progress: Met Short Term Goal  5: Patient will decrease fascial restrictions in left arm from max to mod Short Term Goal 5 Progress: Met Long Term Goals Long Term Goal 1: Patient will return to highest level of independence with all B/IADL, work, and leisure tasks.  Long Term Goal 1 Progress: Met Long Term Goal 2: Patient will report pain in left shoulder at a 3/10 when completing daily  tasks.  Long Term Goal 2 Progress: Met Long Term Goal 3: Patient will increase AROM of Left shoulder to WNL to increase ability to get dressed.  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 Long Term Goal 4 Progress: Met Long Term Goal 5: Patient will decrease fascial restrictions from a mod to trace. Long Term Goal 5 Progress: Met  Problem List Patient Active Problem List   Diagnosis Date Noted  . Pain in joint, shoulder region 06/24/2013  . Muscle weakness (generalized) 06/24/2013  . Incomplete rotator cuff tear   . Diabetes mellitus without complication   . PONV (postoperative nausea and vomiting)   . Fluid retention   . Hypertension     End of Session Activity Tolerance: Patient tolerated treatment well General Behavior During Therapy: [facility] for tasks assessed/performed  GO Functional Assessment Tool Used:  FOTO scored 81% Independent and 19% impaired  Functional Limitation: Carrying, moving and handling objects Carrying, Moving and Handling Objects Goal Status ([id number]): At least 20 percent but less than 40 percent impaired, limited or restricted Carrying, Moving and Handling Objects Discharge Status [phone]): At least 1 percent but less than 20 percent impaired, limited or restricted  Vangie Bicker, OTR/L [phone]  07/16/2013, 10:53 AM  Physician Documentation Your signature is required to indicate approval of the treatment plan as stated above.  Please sign and either send electronically or make a copy of this report for your files and return this physician signed original.  Please mark one 1.__approve of plan  2. ___approve of plan with the following conditions.   ______________________________                                                          _____________________ Physician Signature                                                                                                              Date

## 2013-07-21 ENCOUNTER — Ambulatory Visit (HOSPITAL_COMMUNITY): Payer: PRIVATE HEALTH INSURANCE | Admitting: Specialist

## 2013-07-23 ENCOUNTER — Ambulatory Visit (HOSPITAL_COMMUNITY): Payer: PRIVATE HEALTH INSURANCE

## 2013-07-27 ENCOUNTER — Ambulatory Visit (HOSPITAL_COMMUNITY): Payer: PRIVATE HEALTH INSURANCE

## 2013-07-30 ENCOUNTER — Ambulatory Visit (HOSPITAL_COMMUNITY): Payer: PRIVATE HEALTH INSURANCE | Admitting: Specialist

## 2013-10-12 ENCOUNTER — Telehealth: Payer: Self-pay | Admitting: Internal Medicine

## 2013-10-12 NOTE — Telephone Encounter (Signed)
PATIENT STATED SOMEONE CALLED HER FROM THIS NUMBER AND SHE IS UNSURE ON WEATHER OR NOT SHE NEEDS TO SCHEDULED A COLONOSCOPY [phone]

## 2013-10-12 NOTE — Telephone Encounter (Signed)
I have not called pt.  Doris, have you tried to call pt?

## 2013-10-13 ENCOUNTER — Encounter: Payer: Self-pay | Admitting: Internal Medicine

## 2013-10-13 NOTE — Telephone Encounter (Signed)
Pt is aware of OV on 9/28 at 10 with AS and appt card mailed

## 2013-10-13 NOTE — Telephone Encounter (Signed)
Per Manuela Schwartz, she has been trying to call pt.

## 2013-11-08 ENCOUNTER — Ambulatory Visit: Payer: PRIVATE HEALTH INSURANCE | Admitting: Gastroenterology

## 2013-12-01 ENCOUNTER — Other Ambulatory Visit: Payer: Self-pay

## 2013-12-01 ENCOUNTER — Encounter: Payer: Self-pay | Admitting: Gastroenterology

## 2013-12-01 ENCOUNTER — Ambulatory Visit (INDEPENDENT_AMBULATORY_CARE_PROVIDER_SITE_OTHER): Payer: PRIVATE HEALTH INSURANCE | Admitting: Gastroenterology

## 2013-12-01 ENCOUNTER — Encounter (INDEPENDENT_AMBULATORY_CARE_PROVIDER_SITE_OTHER): Payer: Self-pay

## 2013-12-01 VITALS — BP 160/87 | HR 47 | Temp 97.3°F | Ht 65.0 in | Wt 291.2 lb

## 2013-12-01 DIAGNOSIS — Z86010 Personal history of colonic polyps: Secondary | ICD-10-CM

## 2013-12-01 DIAGNOSIS — Z860101 Personal history of adenomatous and serrated colon polyps: Secondary | ICD-10-CM | POA: Insufficient documentation

## 2013-12-01 MED ORDER — PEG-KCL-NACL-NASULF-NA ASC-C 100 G PO SOLR: 1.0000 | ORAL | Status: DC

## 2013-12-01 NOTE — Progress Notes (Signed)
Primary Care Physician:  Asencion Noble, MD  Primary Gastroenterologist:  Garfield Cornea, MD   Chief Complaint  Patient presents with  . Follow-up    HPI:  Amy Tran is a 58 y.o. female here to schedule surveillance colonoscopy for history of adenomatous colon polyps. Her last colonoscopy was back in 2007. Diminutive rectal polyp at 10 cm (hyperplastic), diminutive polyp at the splenic flexure (adenomatous). Internal hemorrhoids, diverticulosis, left-sided noted.  She denies any GI concern. Bowel movements are regular. No blood in stool or melena. Denies any abdominal pain, vomiting, heartburn, dysphagia, unintentional weight loss.  Current Outpatient Prescriptions  Medication Sig Dispense Refill  . amLODipine (NORVASC) 5 MG tablet Take 5 mg by mouth daily.      . Canagliflozin (INVOKANA) 100 MG TABS Take 100 mg by mouth daily.      Marland Kitchen losartan (COZAAR) 50 MG tablet Take 50 mg by mouth daily.      . metFORMIN (GLUCOPHAGE) 1000 MG tablet Take 1,000 mg by mouth 2 (two) times daily with a meal.       . torsemide (DEMADEX) 20 MG tablet Take 2 tablets by mouth daily.      . peg 3350 powder (MOVIPREP) 100 G SOLR Take 1 kit (200 g total) by mouth as directed.  1 kit  0   No current facility-administered medications for this visit.    Allergies as of 12/01/2013 - Review Complete 12/01/2013  Allergen Reaction Noted  . Penicillins Hives 04/04/2013    Past Medical History  Diagnosis Date  . Hypertension   . Fluid retention   . Diabetes mellitus without complication   . PONV (postoperative nausea and vomiting)   . Wears glasses   . Wears partial dentures     bottom  . Incomplete rotator cuff tear     Past Surgical History  Procedure Laterality Date  . Gastric bypass  2004  . Carpal tunnel release  2001    left  . Cholecystectomy    . Neck surgery  1998  . Shoulder surgery  1999    left  . Ercp  2002  . Colonoscopy  2007    YHC:WCBJSEG internal hemorrhoids. Diminutive rectal  polyp at 10 cm, cold biopsied/removed. The remainder of the rectal mucosa appeared normal Swallow left-sided diverticula. Diminutive polyp at the splenic flexure cold biopsied/removed (adenomatous)  . Appendectomy    . Shoulder arthroscopy with subacromial decompression Left 06/23/2013    Procedure: LEFT SHOULDER ARTHROSCOPY WITH DEBRIDEMENT ROTATOR CUFF AND LABRUM;  Surgeon: Lorn Junes, MD;  Location: [facility];  Service: Orthopedics;  Laterality: Left;    Family History  Problem Relation Age of Onset  . Diabetes Father   . Hypertension Father   . Hypertension Mother   . Colon cancer Neg Hx   . Parkinson's disease Father   . Cancer Father     not sure what kind  . Heart disease Mother   . Cancer Sister     non-hodgkins, in remission    History   Social History  . Marital Status: Married    Spouse Name: N/A    Number of Children: 2  . Years of Education: N/A   Occupational History  . works at [employer]  . Smoking status: Former Smoker    Quit date: 12/01/2001  . Smokeless tobacco: Not on file  . Alcohol Use: No  . Drug Use: No  . Sexual Activity: Not on file  Other Topics Concern  . Not on file   Social History Narrative  . No narrative on file      ROS:  General: Negative for anorexia, weight loss, fever, chills, fatigue, weakness. Eyes: Negative for vision changes.  ENT: Negative for hoarseness, difficulty swallowing , nasal congestion. CV: Negative for chest pain, angina, palpitations, dyspnea on exertion, peripheral edema.  Respiratory: Negative for dyspnea at rest, dyspnea on exertion, cough, sputum, wheezing.  GI: See history of present illness. GU:  Negative for dysuria, hematuria, urinary incontinence, urinary frequency, nocturnal urination.  MS: Negative for joint pain, low back pain.  Derm: Negative for rash or itching.  Neuro: Negative for weakness, abnormal sensation, seizure, frequent headaches,  memory loss, confusion.  Psych: Negative for anxiety, depression, suicidal ideation, hallucinations.  Endo: Negative for unusual weight change.  Heme: Negative for bruising or bleeding. Allergy: Negative for rash or hives.    Physical Examination:  BP 160/87  Pulse 47  Temp(Src) 97.3 F (36.3 C) (Oral)  Ht _0  (1.651 m)  Wt 291 lb 3.2 oz (132.087 kg)  BMI 48.46 kg/m2   General: Well-nourished, well-developed in no acute distress.  Head: Normocephalic, atraumatic.   Eyes: Conjunctiva pink, no icterus. Mouth: Oropharyngeal mucosa moist and pink , no lesions erythema or exudate. Neck: Supple without thyromegaly, masses, or lymphadenopathy.  Lungs: Clear to auscultation bilaterally.  Heart: Regular rate and rhythm, no murmurs rubs or gallops.  Abdomen: Bowel sounds are normal, nontender, nondistended, no hepatosplenomegaly or masses, no abdominal bruits or    hernia , no rebound or guarding.  Exam limited due to body habitus. Rectal: Not performed Extremities: No lower extremity edema. No clubbing or deformities.  Neuro: Alert and oriented x 4 , grossly normal neurologically.  Skin: Warm and dry, no rash or jaundice.   Psych: Alert and cooperative, normal mood and affect.

## 2013-12-01 NOTE — Assessment & Plan Note (Signed)
58 year old lady with history of adenomatous colon polyps, last colonoscopy in 2007, who presents for surveillance exam. Denies any GI symptoms.  I have discussed the risks, alternatives, benefits with regards to but not limited to the risk of reaction to medication, bleeding, infection, perforation and the patient is agreeable to proceed. Written consent to be obtained.

## 2013-12-01 NOTE — Patient Instructions (Signed)
1. Colonoscopy as scheduled. Please see separate instructions. 

## 2013-12-02 NOTE — Progress Notes (Signed)
cc'ed to pcp °

## 2013-12-27 ENCOUNTER — Other Ambulatory Visit: Payer: Self-pay

## 2013-12-27 MED ORDER — PEG-KCL-NACL-NASULF-NA ASC-C 100 G PO SOLR: 1.0000 | ORAL | Status: DC

## 2013-12-28 ENCOUNTER — Encounter (HOSPITAL_COMMUNITY)
Admission: RE | Disposition: A | Discharge status: Home / Self Care | Payer: Self-pay | Source: Ambulatory Visit | Attending: Internal Medicine

## 2013-12-28 ENCOUNTER — Ambulatory Visit (HOSPITAL_COMMUNITY)
Admission: RE | Admit: 2013-12-28 | Discharge: 2013-12-28 | Disposition: A | Discharge status: Home / Self Care | Payer: PRIVATE HEALTH INSURANCE | Source: Ambulatory Visit | Attending: Internal Medicine | Admitting: Internal Medicine

## 2013-12-28 ENCOUNTER — Encounter (HOSPITAL_COMMUNITY): Payer: Self-pay | Admitting: *Deleted

## 2013-12-28 DIAGNOSIS — Z79899 Other long term (current) drug therapy: Secondary | ICD-10-CM | POA: Diagnosis not present

## 2013-12-28 DIAGNOSIS — Z809 Family history of malignant neoplasm, unspecified: Secondary | ICD-10-CM | POA: Insufficient documentation

## 2013-12-28 DIAGNOSIS — Z980 Intestinal bypass and anastomosis status: Secondary | ICD-10-CM | POA: Insufficient documentation

## 2013-12-28 DIAGNOSIS — Z9884 Bariatric surgery status: Secondary | ICD-10-CM | POA: Insufficient documentation

## 2013-12-28 DIAGNOSIS — Z87891 Personal history of nicotine dependence: Secondary | ICD-10-CM | POA: Insufficient documentation

## 2013-12-28 DIAGNOSIS — Z86010 Personal history of colonic polyps: Secondary | ICD-10-CM | POA: Diagnosis not present

## 2013-12-28 DIAGNOSIS — E119 Type 2 diabetes mellitus without complications: Secondary | ICD-10-CM | POA: Insufficient documentation

## 2013-12-28 DIAGNOSIS — Z1211 Encounter for screening for malignant neoplasm of colon: Secondary | ICD-10-CM | POA: Insufficient documentation

## 2013-12-28 DIAGNOSIS — K5730 Diverticulosis of large intestine without perforation or abscess without bleeding: Secondary | ICD-10-CM | POA: Insufficient documentation

## 2013-12-28 DIAGNOSIS — I10 Essential (primary) hypertension: Secondary | ICD-10-CM | POA: Diagnosis not present

## 2013-12-28 HISTORY — PX: COLONOSCOPY: SHX5424

## 2013-12-28 LAB — GLUCOSE, CAPILLARY: Glucose-Capillary: 130 mg/dL — ABNORMAL HIGH (ref 70–99)

## 2013-12-28 SURGERY — COLONOSCOPY
Anesthesia: Moderate Sedation

## 2013-12-28 MED ORDER — MIDAZOLAM HCL 5 MG/5ML IJ SOLN
INTRAMUSCULAR | Status: DC | PRN
  Administered 2013-12-28: 1 mg via INTRAVENOUS
  Administered 2013-12-28 (×2): 2 mg via INTRAVENOUS

## 2013-12-28 MED ORDER — ONDANSETRON HCL 4 MG/2ML IJ SOLN
INTRAMUSCULAR | Status: DC | PRN
  Administered 2013-12-28: 4 mg via INTRAVENOUS

## 2013-12-28 MED ORDER — MEPERIDINE HCL 100 MG/ML IJ SOLN
INTRAMUSCULAR | Status: DC | PRN
  Administered 2013-12-28: 25 mg via INTRAVENOUS
  Administered 2013-12-28: 50 mg via INTRAVENOUS

## 2013-12-28 MED ORDER — MIDAZOLAM HCL 5 MG/5ML IJ SOLN
INTRAMUSCULAR | Status: AC
  Filled 2013-12-28: qty 10

## 2013-12-28 MED ORDER — ONDANSETRON HCL 4 MG/2ML IJ SOLN
INTRAMUSCULAR | Status: AC
  Filled 2013-12-28: qty 2

## 2013-12-28 MED ORDER — SODIUM CHLORIDE 0.9 % IV SOLN
INTRAVENOUS | Status: DC
  Administered 2013-12-28: 08:00:00 via INTRAVENOUS

## 2013-12-28 MED ORDER — STERILE WATER FOR IRRIGATION IR SOLN
Status: DC | PRN
  Administered 2013-12-28: 09:00:00

## 2013-12-28 MED ORDER — MEPERIDINE HCL 100 MG/ML IJ SOLN
INTRAMUSCULAR | Status: AC
  Filled 2013-12-28: qty 2

## 2013-12-28 NOTE — Op Note (Signed)
[facility] [address] [place], [address]   COLONOSCOPY PROCEDURE REPORT  PATIENT: Amy, Tran  MR#: [id number] BIRTHDATE: Jan 31, 1956 , 41  yrs. old GENDER: female ENDOSCOPIST: R.  Garfield Cornea, MD FACP [facility] REFERRED BY:Roy Willey Blade, M.D. PROCEDURE DATE:  Jan 16, 2014 PROCEDURE:   Colonoscopy, surveillance INDICATIONS: History of colonic adenoma. MEDICATIONS: Versed 5 mg IV and Demerol 75 mg IV in divided doses. Zofran 4 mg IV. ASA CLASS:       Class III  CONSENT: The risks, benefits, alternatives and imponderables including but not limited to bleeding, perforation as well as the possibility of a missed lesion have been reviewed.  The potential for biopsy, lesion removal, etc. have also been discussed. Questions have been answered.  All parties agreeable.  Please see the history and physical in the medical record for more information.  DESCRIPTION OF PROCEDURE:   After the risks benefits and alternatives of the procedure were thoroughly explained, informed consent was obtained.  The digital rectal exam revealed no rectal mass.   The [id number] ([id number])  endoscope was introduced through the anus and advanced to the cecum, which was identified by both the appendix and ileocecal valve. No adverse events experienced. The quality of the prep was adequate.  The instrument was then slowly withdrawn as the colon was fully examined.      COLON FINDINGS: Normal-appearing rectal mucosa.  Scattered left-sided diverticula; the remainder of the colonic mucosa appeared normal.  Retroflexion was performed. .  Withdrawal time=8 minutes 0 seconds.  The scope was withdrawn and the procedure completed. COMPLICATIONS: There were no immediate complications.  ENDOSCOPIC IMPRESSION: Colonic diverticulosis.  RECOMMENDATIONS: Repeat surveillance examination 5 years  eSigned:  R. Garfield Cornea, MD Rosalita Chessman [facility] January 16, 2014 10:03 AM   cc:  CPT CODES: ICD CODES:  The  ICD and CPT codes recommended by this software are interpretations from the data that the clinical staff has captured with the software.  The verification of the translation of this report to the ICD and CPT codes and modifiers is the sole responsibility of the health care institution and practicing physician where this report was generated.  [employer] will not be held responsible for the validity of the ICD and CPT codes included on this report.  AMA assumes no liability for data contained or not contained herein. CPT is a Designer, television/film set of the [employer].

## 2013-12-28 NOTE — Interval H&P Note (Signed)
History and Physical Interval Note:  12/28/2013 9:23 AM  Amy Tran  has presented today for surgery, with the diagnosis of colonic polyps  The various methods of treatment have been discussed with the patient and family. After consideration of risks, benefits and other options for treatment, the patient has consented to  Procedure(s) with comments: COLONOSCOPY (N/A) - 730 - moved to 8:30 - Ginger notified pt as a surgical intervention .  The patient's history has been reviewed, patient examined, no change in status, stable for surgery.  I have reviewed the patient's chart and labs.  Questions were answered to the patient's satisfaction.     Amy Tran  No change. Colonoscopy per plan.The risks, benefits, limitations, alternatives and imponderables have been reviewed with the patient. Questions have been answered. All parties are agreeable.

## 2013-12-28 NOTE — H&P (View-Only) (Signed)
Primary Care Physician:  Asencion Noble, MD  Primary Gastroenterologist:  Garfield Cornea, MD   Chief Complaint  Patient presents with  . Follow-up    HPI:  Amy Tran is a 58 y.o. female here to schedule surveillance colonoscopy for history of adenomatous colon polyps. Her last colonoscopy was back in 2007. Diminutive rectal polyp at 10 cm (hyperplastic), diminutive polyp at the splenic flexure (adenomatous). Internal hemorrhoids, diverticulosis, left-sided noted.  She denies any GI concern. Bowel movements are regular. No blood in stool or melena. Denies any abdominal pain, vomiting, heartburn, dysphagia, unintentional weight loss.  Current Outpatient Prescriptions  Medication Sig Dispense Refill  . amLODipine (NORVASC) 5 MG tablet Take 5 mg by mouth daily.      . Canagliflozin (INVOKANA) 100 MG TABS Take 100 mg by mouth daily.      Marland Kitchen losartan (COZAAR) 50 MG tablet Take 50 mg by mouth daily.      . metFORMIN (GLUCOPHAGE) 1000 MG tablet Take 1,000 mg by mouth 2 (two) times daily with a meal.       . torsemide (DEMADEX) 20 MG tablet Take 2 tablets by mouth daily.      . peg 3350 powder (MOVIPREP) 100 G SOLR Take 1 kit (200 g total) by mouth as directed.  1 kit  0   No current facility-administered medications for this visit.    Allergies as of 12/01/2013 - Review Complete 12/01/2013  Allergen Reaction Noted  . Penicillins Hives 04/04/2013    Past Medical History  Diagnosis Date  . Hypertension   . Fluid retention   . Diabetes mellitus without complication   . PONV (postoperative nausea and vomiting)   . Wears glasses   . Wears partial dentures     bottom  . Incomplete rotator cuff tear     Past Surgical History  Procedure Laterality Date  . Gastric bypass  2004  . Carpal tunnel release  2001    left  . Cholecystectomy    . Neck surgery  1998  . Shoulder surgery  1999    left  . Ercp  2002  . Colonoscopy  2007    YHC:WCBJSEG internal hemorrhoids. Diminutive rectal  polyp at 10 cm, cold biopsied/removed. The remainder of the rectal mucosa appeared normal Swallow left-sided diverticula. Diminutive polyp at the splenic flexure cold biopsied/removed (adenomatous)  . Appendectomy    . Shoulder arthroscopy with subacromial decompression Left 06/23/2013    Procedure: LEFT SHOULDER ARTHROSCOPY WITH DEBRIDEMENT ROTATOR CUFF AND LABRUM;  Surgeon: Lorn Junes, MD;  Location: [facility];  Service: Orthopedics;  Laterality: Left;    Family History  Problem Relation Age of Onset  . Diabetes Father   . Hypertension Father   . Hypertension Mother   . Colon cancer Neg Hx   . Parkinson's disease Father   . Cancer Father     not sure what kind  . Heart disease Mother   . Cancer Sister     non-hodgkins, in remission    History   Social History  . Marital Status: Married    Spouse Name: N/A    Number of Children: 2  . Years of Education: N/A   Occupational History  . works at [employer]  . Smoking status: Former Smoker    Quit date: 12/01/2001  . Smokeless tobacco: Not on file  . Alcohol Use: No  . Drug Use: No  . Sexual Activity: Not on file  Other Topics Concern  . Not on file   Social History Narrative  . No narrative on file      ROS:  General: Negative for anorexia, weight loss, fever, chills, fatigue, weakness. Eyes: Negative for vision changes.  ENT: Negative for hoarseness, difficulty swallowing , nasal congestion. CV: Negative for chest pain, angina, palpitations, dyspnea on exertion, peripheral edema.  Respiratory: Negative for dyspnea at rest, dyspnea on exertion, cough, sputum, wheezing.  GI: See history of present illness. GU:  Negative for dysuria, hematuria, urinary incontinence, urinary frequency, nocturnal urination.  MS: Negative for joint pain, low back pain.  Derm: Negative for rash or itching.  Neuro: Negative for weakness, abnormal sensation, seizure, frequent headaches,  memory loss, confusion.  Psych: Negative for anxiety, depression, suicidal ideation, hallucinations.  Endo: Negative for unusual weight change.  Heme: Negative for bruising or bleeding. Allergy: Negative for rash or hives.    Physical Examination:  BP 160/87  Pulse 47  Temp(Src) 97.3 F (36.3 C) (Oral)  Ht _0  (1.651 m)  Wt 291 lb 3.2 oz (132.087 kg)  BMI 48.46 kg/m2   General: Well-nourished, well-developed in no acute distress.  Head: Normocephalic, atraumatic.   Eyes: Conjunctiva pink, no icterus. Mouth: Oropharyngeal mucosa moist and pink , no lesions erythema or exudate. Neck: Supple without thyromegaly, masses, or lymphadenopathy.  Lungs: Clear to auscultation bilaterally.  Heart: Regular rate and rhythm, no murmurs rubs or gallops.  Abdomen: Bowel sounds are normal, nontender, nondistended, no hepatosplenomegaly or masses, no abdominal bruits or    hernia , no rebound or guarding.  Exam limited due to body habitus. Rectal: Not performed Extremities: No lower extremity edema. No clubbing or deformities.  Neuro: Alert and oriented x 4 , grossly normal neurologically.  Skin: Warm and dry, no rash or jaundice.   Psych: Alert and cooperative, normal mood and affect.

## 2013-12-28 NOTE — Discharge Instructions (Signed)
°Colonoscopy °Discharge Instructions ° °Read the instructions outlined below and refer to this sheet in the next few weeks. These discharge instructions provide you with general information on caring for yourself after you leave the hospital. Your doctor may also give you specific instructions. While your treatment has been planned according to the most current medical practices available, unavoidable complications occasionally occur. If you have any problems or questions after discharge, call Dr. Rourk at [phone]. °ACTIVITY °· You may resume your regular activity, but move at a slower pace for the next 24 hours.  °· Take frequent rest periods for the next 24 hours.  °· Walking will help get rid of the air and reduce the bloated feeling in your belly (abdomen).  °· No driving for 24 hours (because of the medicine (anesthesia) used during the test).   °· Do not sign any important legal documents or operate any machinery for 24 hours (because of the anesthesia used during the test).  °NUTRITION °· Drink plenty of fluids.  °· You may resume your normal diet as instructed by your doctor.  °· Begin with a light meal and progress to your normal diet. Heavy or fried foods are harder to digest and may make you feel sick to your stomach (nauseated).  °· Avoid alcoholic beverages for 24 hours or as instructed.  °MEDICATIONS °· You may resume your normal medications unless your doctor tells you otherwise.  °WHAT YOU CAN EXPECT TODAY °· Some feelings of bloating in the abdomen.  °· Passage of more gas than usual.  °· Spotting of blood in your stool or on the toilet paper.  °IF YOU HAD POLYPS REMOVED DURING THE COLONOSCOPY: °· No aspirin products for 7 days or as instructed.  °· No alcohol for 7 days or as instructed.  °· Eat a soft diet for the next 24 hours.  °FINDING OUT THE RESULTS OF YOUR TEST °Not all test results are available during your visit. If your test results are not back during the visit, make an appointment  with your caregiver to find out the results. Do not assume everything is normal if you have not heard from your caregiver or the medical facility. It is important for you to follow up on all of your test results.  °SEEK IMMEDIATE MEDICAL ATTENTION IF: °· You have more than a spotting of blood in your stool.  °· Your belly is swollen (abdominal distention).  °· You are nauseated or vomiting.  °· You have a temperature over 101.  °· You have abdominal pain or discomfort that is severe or gets worse throughout the day.  ° ° ° ° °Diverticulosis information provided ° °Repeat colonoscopy in 5 years ° ° ° °Diverticulosis °Diverticulosis is the condition that develops when small pouches (diverticula) form in the wall of your colon. Your colon, or large intestine, is where water is absorbed and stool is formed. The pouches form when the inside layer of your colon pushes through weak spots in the outer layers of your colon. °CAUSES  °No one knows exactly what causes diverticulosis. °RISK FACTORS °· Being older than 50. Your risk for this condition increases with age. Diverticulosis is rare in people younger than 40 years. By age 80, almost everyone has it. °· Eating a low-fiber diet. °· Being frequently constipated. °· Being overweight. °· Not getting enough exercise. °· Smoking. °· Taking over-the-counter pain medicines, like aspirin and ibuprofen. °SYMPTOMS  °Most people with diverticulosis do not have symptoms. °DIAGNOSIS  °Because diverticulosis often   has no symptoms, health care providers often discover the condition during an exam for other colon problems. In many cases, a health care provider will diagnose diverticulosis while using a flexible scope to examine the colon (colonoscopy). °TREATMENT  °If you have never developed an infection related to diverticulosis, you may not need treatment. If you have had an infection before, treatment may include: °· Eating more fruits, vegetables, and grains. °· Taking a fiber  supplement. °· Taking a live bacteria supplement (probiotic). °· Taking medicine to relax your colon. °HOME CARE INSTRUCTIONS  °· Drink at least 6-8 glasses of water each day to prevent constipation. °· Try not to strain when you have a bowel movement. °· Keep all follow-up appointments. °If you have had an infection before:  °· Increase the fiber in your diet as directed by your health care provider or dietitian. °· Take a dietary fiber supplement if your health care provider approves. °· Only take medicines as directed by your health care provider. °SEEK MEDICAL CARE IF:  °· You have abdominal pain. °· You have bloating. °· You have cramps. °· You have not gone to the bathroom in 3 days. °SEEK IMMEDIATE MEDICAL CARE IF:  °· Your pain gets worse. °· Your bloating becomes very bad. °· You have a fever or chills, and your symptoms suddenly get worse. °· You begin vomiting. °· You have bowel movements that are bloody or black. °MAKE SURE YOU: °· Understand these instructions. °· Will watch your condition. °· Will get help right away if you are not doing well or get worse. °Document Released: 10/26/2003 Document Revised: 02/02/2013 Document Reviewed: 12/23/2012 °[employer]® Patient Information ©2015 [employer]. This information is not intended to replace advice given to you by your health care provider. Make sure you discuss any questions you have with your health care provider. ° °

## 2013-12-29 ENCOUNTER — Encounter (HOSPITAL_COMMUNITY): Payer: Self-pay | Admitting: Internal Medicine

## 2014-02-28 DIAGNOSIS — H40013 Open angle with borderline findings, low risk, bilateral: Secondary | ICD-10-CM | POA: Diagnosis not present

## 2014-04-26 DIAGNOSIS — L920 Granuloma annulare: Secondary | ICD-10-CM | POA: Diagnosis not present

## 2014-04-26 DIAGNOSIS — C44319 Basal cell carcinoma of skin of other parts of face: Secondary | ICD-10-CM | POA: Diagnosis not present

## 2014-04-26 DIAGNOSIS — L570 Actinic keratosis: Secondary | ICD-10-CM | POA: Diagnosis not present

## 2014-04-26 DIAGNOSIS — C44310 Basal cell carcinoma of skin of unspecified parts of face: Secondary | ICD-10-CM | POA: Diagnosis not present

## 2014-05-16 DIAGNOSIS — H029 Unspecified disorder of eyelid: Secondary | ICD-10-CM | POA: Diagnosis not present

## 2014-05-16 DIAGNOSIS — H40013 Open angle with borderline findings, low risk, bilateral: Secondary | ICD-10-CM | POA: Diagnosis not present

## 2014-05-26 DIAGNOSIS — C44310 Basal cell carcinoma of skin of unspecified parts of face: Secondary | ICD-10-CM | POA: Diagnosis not present

## 2014-05-30 DIAGNOSIS — E119 Type 2 diabetes mellitus without complications: Secondary | ICD-10-CM | POA: Diagnosis not present

## 2014-05-30 DIAGNOSIS — E785 Hyperlipidemia, unspecified: Secondary | ICD-10-CM | POA: Diagnosis not present

## 2014-05-30 DIAGNOSIS — Z79899 Other long term (current) drug therapy: Secondary | ICD-10-CM | POA: Diagnosis not present

## 2014-06-06 DIAGNOSIS — E1129 Type 2 diabetes mellitus with other diabetic kidney complication: Secondary | ICD-10-CM | POA: Diagnosis not present

## 2014-06-06 DIAGNOSIS — I10 Essential (primary) hypertension: Secondary | ICD-10-CM | POA: Diagnosis not present

## 2014-06-06 DIAGNOSIS — H05229 Edema of unspecified orbit: Secondary | ICD-10-CM | POA: Diagnosis not present

## 2014-06-06 DIAGNOSIS — Z6841 Body Mass Index (BMI) 40.0 and over, adult: Secondary | ICD-10-CM | POA: Diagnosis not present

## 2014-06-27 DIAGNOSIS — H11432 Conjunctival hyperemia, left eye: Secondary | ICD-10-CM | POA: Diagnosis not present

## 2014-06-27 DIAGNOSIS — D485 Neoplasm of uncertain behavior of skin: Secondary | ICD-10-CM | POA: Diagnosis not present

## 2014-08-05 ENCOUNTER — Other Ambulatory Visit (HOSPITAL_COMMUNITY)
Admission: RE | Admit: 2014-08-05 | Discharge: 2014-08-05 | Disposition: A | Discharge status: Home / Self Care | Payer: Medicaid Other | Source: Ambulatory Visit | Attending: Obstetrics and Gynecology | Admitting: Obstetrics and Gynecology

## 2014-08-05 ENCOUNTER — Encounter: Payer: Self-pay | Admitting: Obstetrics and Gynecology

## 2014-08-05 ENCOUNTER — Ambulatory Visit (INDEPENDENT_AMBULATORY_CARE_PROVIDER_SITE_OTHER): Payer: Medicare Other | Admitting: Obstetrics and Gynecology

## 2014-08-05 VITALS — BP 120/76 | Ht 64.5 in | Wt 290.0 lb

## 2014-08-05 DIAGNOSIS — Z1151 Encounter for screening for human papillomavirus (HPV): Secondary | ICD-10-CM | POA: Diagnosis present

## 2014-08-05 DIAGNOSIS — Z01419 Encounter for gynecological examination (general) (routine) without abnormal findings: Secondary | ICD-10-CM | POA: Insufficient documentation

## 2014-08-05 MED ORDER — KETOCONAZOLE 2 % EX CREA
1.0000 | TOPICAL_CREAM | Freq: Every day | CUTANEOUS | Status: DC
Start: 2014-08-05 — End: 2015-09-06

## 2014-08-05 NOTE — Progress Notes (Signed)
Patient ID: Amy Tran, female   DOB: 1955/05/27, 59 y.o.   MRN: [id number] Pt here today for her annual exam. Pt states that she has had some vaginal itching for about 3-4 days, it's not terrible but noticeable.

## 2014-08-05 NOTE — Progress Notes (Signed)
Patient ID: Amy Tran, female   DOB: 04-21-1955, 59 y.o.   MRN: [id number]  Assessment:  Annual Gyn Exam Morbid obesity DM  HTN  tinea versicolor under breast Plan:  1. pap smear done, next pap due 3 years 2. return annually or prn 3    Annual mammogram advised 4. Rx Ketoconazole 2% for breast skin Subjective:  [name] is a 59 y.o. female with a history of HTN and DM. No obstetric history on file. who presents for annual exam. No LMP recorded. Patient is postmenopausal. The patient has no complaints today. She was diagnosed with DM 3-4 years ago which she treats with Metformin. Pt reports weight gain since her last visit. She notes some fluid retention in her LE which is being followed by her PCP. Pt works 2 jobs currently because her husband is out of work and trying to get disability.   The following portions of the patient's history were reviewed and updated as appropriate: allergies, current medications, past family history, past medical history, past social history, past surgical history and problem list. Past Medical History  Diagnosis Date  . Hypertension   . Fluid retention   . Diabetes mellitus without complication   . PONV (postoperative nausea and vomiting)   . Wears glasses   . Wears partial dentures     bottom  . Incomplete rotator cuff tear   . Cancer     multiple skin cancers    Past Surgical History  Procedure Laterality Date  . Gastric bypass  2004  . Carpal tunnel release  2001    left  . Cholecystectomy    . Neck surgery  1998  . Shoulder surgery  1999    left  . Ercp  2002  . Colonoscopy  2007    LEX:NTZGYFV internal hemorrhoids. Diminutive rectal polyp at 10 cm, cold biopsied/removed. The remainder of the rectal mucosa appeared normal Swallow left-sided diverticula. Diminutive polyp at the splenic flexure cold biopsied/removed (adenomatous)  . Appendectomy    . Shoulder arthroscopy with subacromial decompression Left 06/23/2013   Procedure: LEFT SHOULDER ARTHROSCOPY WITH DEBRIDEMENT ROTATOR CUFF AND LABRUM;  Surgeon: Lorn Junes, MD;  Location: [place];  Service: Orthopedics;  Laterality: Left;  . Colonoscopy N/A 12/28/2013    Procedure: COLONOSCOPY;  Surgeon: Daneil Dolin, MD;  [facility];  Service: Endoscopy;  Laterality: N/A;  730 - moved to 8:30 - Ginger notified pt     Current outpatient prescriptions:  .  amLODipine (NORVASC) 5 MG tablet, Take 5 mg by mouth daily., Disp: , Rfl:  .  Canagliflozin (INVOKANA) 100 MG TABS, Take 100 mg by mouth daily., Disp: , Rfl:  .  [employer] MAGNESIUM OXIDE 500 MG TABS, Take 1 tablet by mouth daily., Disp: , Rfl: 3 .  losartan (COZAAR) 100 MG tablet, Take 100 mg by mouth daily., Disp: , Rfl:  .  metFORMIN (GLUCOPHAGE) 1000 MG tablet, Take 1,000 mg by mouth 2 (two) times daily with a meal. , Disp: , Rfl:  .  simvastatin (ZOCOR) 20 MG tablet, , Disp: , Rfl:  .  torsemide (DEMADEX) 20 MG tablet, Take 2 tablets by mouth daily., Disp: , Rfl:   Review of Systems Constitutional: negative Gastrointestinal: negative Genitourinary: negative  Objective:  BP 120/76 mmHg  Ht 5' 4.5" (1.638 m)  Wt 290 lb (131.543 kg)  BMI 49.03 kg/m2   BMI: Body mass index is 49.03 kg/(m^2).  General Appearance: Alert, appropriate appearance for  age. No acute distress HEENT: Grossly normal Neck / Thyroid:  Cardiovascular: RRR; normal S1, S2, no murmur Lungs: CTA bilaterally Back: No CVAT Breast Exam: No dimpling, nipple retraction or discharge. No masses or nodes., Normal to inspection, Normal breast tissue bilaterally and No masses or nodes.No dimpling, nipple retraction or discharge. Some redness under right breast. Gastrointestinal: Soft, non-tender, no masses or organomegaly Pelvic Exam: Vulva and vagina appear normal. Bimanual exam reveals normal uterus and adnexa. External genitalia: normal general appearance Vaginal: normal mucosa without prolapse or lesions  and normal without tenderness, induration or masses Cervix: normal appearance Adnexa: normal bimanual exam Uterus: normal single, nontender Rectovaginal: Not indicated, due to colonoscopy on 12/2013 Lymphatic Exam: Non-palpable nodes in neck, clavicular, axillary, or inguinal regions  Skin: no rash or abnormalities Neurologic: Normal gait and speech, no tremor  Psychiatric: Alert and oriented, appropriate affect.  Urinalysis:Not done  Mallory Shirk. MD Pgr [phone] 9:49 AM    This chart was scribed for Amy Kind, MD by Amy Tran, ED Scribe. This patient was seen in room 1 and the patient's care was started at 9:49 AM.   I personally performed the services described in this documentation, which was SCRIBED in my presence. The recorded information has been reviewed and considered accurate. It has been edited as necessary during review. Amy Kind, MD

## 2014-08-08 DIAGNOSIS — C44119 Basal cell carcinoma of skin of left eyelid, including canthus: Secondary | ICD-10-CM | POA: Diagnosis not present

## 2014-08-08 DIAGNOSIS — D485 Neoplasm of uncertain behavior of skin: Secondary | ICD-10-CM | POA: Diagnosis not present

## 2014-08-08 LAB — CYTOLOGY - PAP

## 2014-09-30 DIAGNOSIS — I10 Essential (primary) hypertension: Secondary | ICD-10-CM | POA: Diagnosis not present

## 2014-09-30 DIAGNOSIS — Z79899 Other long term (current) drug therapy: Secondary | ICD-10-CM | POA: Diagnosis not present

## 2014-09-30 DIAGNOSIS — E119 Type 2 diabetes mellitus without complications: Secondary | ICD-10-CM | POA: Diagnosis not present

## 2014-10-03 DIAGNOSIS — C44119 Basal cell carcinoma of skin of left eyelid, including canthus: Secondary | ICD-10-CM | POA: Diagnosis not present

## 2014-10-03 DIAGNOSIS — S0181XS Laceration without foreign body of other part of head, sequela: Secondary | ICD-10-CM | POA: Diagnosis not present

## 2014-10-03 DIAGNOSIS — C6952 Malignant neoplasm of left lacrimal gland and duct: Secondary | ICD-10-CM | POA: Diagnosis not present

## 2014-10-03 DIAGNOSIS — S01112S Laceration without foreign body of left eyelid and periocular area, sequela: Secondary | ICD-10-CM | POA: Diagnosis not present

## 2014-10-03 DIAGNOSIS — Z85828 Personal history of other malignant neoplasm of skin: Secondary | ICD-10-CM | POA: Diagnosis not present

## 2014-10-07 DIAGNOSIS — E1129 Type 2 diabetes mellitus with other diabetic kidney complication: Secondary | ICD-10-CM | POA: Diagnosis not present

## 2014-10-07 DIAGNOSIS — Z6841 Body Mass Index (BMI) 40.0 and over, adult: Secondary | ICD-10-CM | POA: Diagnosis not present

## 2014-10-07 DIAGNOSIS — Z0001 Encounter for general adult medical examination with abnormal findings: Secondary | ICD-10-CM | POA: Diagnosis not present

## 2014-10-11 DIAGNOSIS — S83241A Other tear of medial meniscus, current injury, right knee, initial encounter: Secondary | ICD-10-CM | POA: Diagnosis not present

## 2014-11-01 DIAGNOSIS — M25561 Pain in right knee: Secondary | ICD-10-CM | POA: Diagnosis not present

## 2014-11-07 DIAGNOSIS — M25561 Pain in right knee: Secondary | ICD-10-CM | POA: Diagnosis not present

## 2014-11-15 DIAGNOSIS — Q759 Congenital malformation of skull and face bones, unspecified: Secondary | ICD-10-CM | POA: Diagnosis not present

## 2014-11-15 DIAGNOSIS — Z6841 Body Mass Index (BMI) 40.0 and over, adult: Secondary | ICD-10-CM | POA: Diagnosis not present

## 2014-11-16 ENCOUNTER — Other Ambulatory Visit (HOSPITAL_COMMUNITY): Payer: Self-pay | Admitting: Internal Medicine

## 2014-11-16 ENCOUNTER — Ambulatory Visit (HOSPITAL_COMMUNITY)
Admission: RE | Admit: 2014-11-16 | Discharge: 2014-11-16 | Disposition: A | Discharge status: Home / Self Care | Payer: Medicare Other | Source: Ambulatory Visit | Attending: Internal Medicine | Admitting: Internal Medicine

## 2014-11-16 DIAGNOSIS — R220 Localized swelling, mass and lump, head: Secondary | ICD-10-CM | POA: Diagnosis not present

## 2014-11-16 DIAGNOSIS — Z9889 Other specified postprocedural states: Secondary | ICD-10-CM | POA: Diagnosis not present

## 2014-11-16 DIAGNOSIS — G9389 Other specified disorders of brain: Secondary | ICD-10-CM

## 2014-11-22 ENCOUNTER — Ambulatory Visit (HOSPITAL_COMMUNITY): Payer: Medicare Other | Admitting: Physical Therapy

## 2014-11-24 ENCOUNTER — Ambulatory Visit (HOSPITAL_COMMUNITY): Payer: Medicare Other | Attending: Orthopedic Surgery | Admitting: Physical Therapy

## 2014-11-24 DIAGNOSIS — M21869 Other specified acquired deformities of unspecified lower leg: Secondary | ICD-10-CM | POA: Diagnosis not present

## 2014-11-24 DIAGNOSIS — M6281 Muscle weakness (generalized): Secondary | ICD-10-CM | POA: Insufficient documentation

## 2014-11-24 DIAGNOSIS — M94261 Chondromalacia, right knee: Secondary | ICD-10-CM | POA: Diagnosis not present

## 2014-11-24 DIAGNOSIS — M25561 Pain in right knee: Secondary | ICD-10-CM | POA: Diagnosis not present

## 2014-11-24 DIAGNOSIS — Z658 Other specified problems related to psychosocial circumstances: Secondary | ICD-10-CM | POA: Diagnosis not present

## 2014-11-24 DIAGNOSIS — R6889 Other general symptoms and signs: Secondary | ICD-10-CM

## 2014-11-24 DIAGNOSIS — Z789 Other specified health status: Secondary | ICD-10-CM

## 2014-11-24 DIAGNOSIS — M25659 Stiffness of unspecified hip, not elsewhere classified: Secondary | ICD-10-CM | POA: Insufficient documentation

## 2014-11-24 DIAGNOSIS — R262 Difficulty in walking, not elsewhere classified: Secondary | ICD-10-CM | POA: Diagnosis not present

## 2014-11-24 NOTE — Patient Instructions (Signed)
   MEDICINE BALL BRIDGE  While lying on your back, raise your buttocks off the floor/bed while holding a medicine ball between your knees as shown.  Repeat 10 times each leg, 2x/day.    STRAIGHT LEG RAISE - SLR  While lying or sitting, raise up your leg with a straight knee.  Keep the opposite knee bent with the foot planted to the ground.  Repeat 10 times each leg, 2x/day.    STRAIGHT LEG RAISE - SLR EXTERNAL ROTATION  While lying or sitting, raise up your leg with a straight knee and your toes pointed outward.  Repeat 10 times, 2x/day.    Wall squats with ball  Stand against a wall, feet out in front of you, and a ball between your knees.  Squeeze the ball and lower down into a squat position, avoiding pain.    Repeat 10x, 2x/day.

## 2014-11-24 NOTE — Therapy (Signed)
Amy Tran, [place], [address] Phone: [phone]   Fax:  [phone]  Physical Therapy Evaluation  Patient Details  Name: Amy Tran MRN: [id number] Date of Birth: 12-Apr-1955 Referring Provider:  Elsie Saas, MD  Encounter Date: 11/24/2014      PT End of Session - 11/24/14 1646    Visit Number 1   Number of Visits 14   Date for PT Re-Evaluation 12/22/14   Authorization Type Medicare and Medicaid    Authorization Time Period 11/24/14 to 01/24/15   Authorization - Visit Number 1   Authorization - Number of Visits 10   PT Start Time 2542   PT Stop Time 1643   PT Time Calculation (min) 38 min   Activity Tolerance Patient tolerated treatment well   Behavior During Therapy [facility] for tasks assessed/performed      Past Medical History  Diagnosis Date  . Hypertension   . Fluid retention   . Diabetes mellitus without complication   . PONV (postoperative nausea and vomiting)   . Wears glasses   . Wears partial dentures     bottom  . Incomplete rotator cuff tear   . Cancer     multiple skin cancers    Past Surgical History  Procedure Laterality Date  . Gastric bypass  2004  . Carpal tunnel release  2001    left  . Cholecystectomy    . Neck surgery  1998  . Shoulder surgery  1999    left  . Ercp  2002  . Colonoscopy  2007    HCW:CBJSEGB internal hemorrhoids. Diminutive rectal polyp at 10 cm, cold biopsied/removed. The remainder of the rectal mucosa appeared normal Swallow left-sided diverticula. Diminutive polyp at the splenic flexure cold biopsied/removed (adenomatous)  . Appendectomy    . Shoulder arthroscopy with subacromial decompression Left 06/23/2013    Procedure: LEFT SHOULDER ARTHROSCOPY WITH DEBRIDEMENT ROTATOR CUFF AND LABRUM;  Surgeon: Lorn Junes, MD;  Location: [place];  Service: Orthopedics;  Laterality: Left;  . Colonoscopy N/A 12/28/2013    Procedure:  COLONOSCOPY;  Surgeon: Daneil Dolin, MD;  [facility];  Service: Endoscopy;  Laterality: N/A;  730 - moved to 8:30 - Ginger notified pt    There were no vitals filed for this visit.  Visit Diagnosis:  Chondromalacia of knee, right - Plan: PT plan of care cert/re-cert  Right knee pain - Plan: PT plan of care cert/re-cert  Genu recurvatum, unspecified laterality - Plan: PT plan of care cert/re-cert  Difficulty walking - Plan: PT plan of care cert/re-cert  Difficulty navigating stairs - Plan: PT plan of care cert/re-cert  Muscle weakness - Plan: PT plan of care cert/re-cert  Hip stiffness, unspecified laterality - Plan: PT plan of care cert/re-cert      Subjective Assessment - 11/24/14 1608    Subjective R knee is real sore and pain is on the medial side of her knee; she reports that an MRI was done to rule out inflammation in her tendons as pain was medial side of knee and she was told by MD imaging was done for tendonitis and she is not having any issues with her ankle or calf right now. She is complaining of cramps that have strated in past couple days in her inner and posterior thighs.    Pertinent History Knee started hurting about 4 weeks ago insidiously; patient reports that she works on [employer] and has to be  on her feet and standing a lot. Has been on medications for her knee but has not received any other treatments.   How long can you sit comfortably? no limits    How long can you stand comfortably? painful but does not have to stop standing becasue of it    How long can you walk comfortably? around 15 minutes before knee feels like it is going to give out    Patient Stated Goals reduce pain    Currently in Pain? Yes   Pain Score 5    Pain Location Knee   Pain Orientation Right            [facility] PT Assessment - 11/24/14 0001    Assessment   Medical Diagnosis R knee grade IV patella femoral chondromalacia    Onset Date/Surgical Date 10/27/14   Next MD  Visit patient to finish course of PT then call MD Noemi Chapel for follow-up    Precautions   Precautions None   Restrictions   Weight Bearing Restrictions No   Balance Screen   Has the patient fallen in the past 6 months No   Has the patient had a decrease in activity level because of a fear of falling?  Yes   Is the patient reluctant to leave their home because of a fear of falling?  Yes   Prior Function   Level of Independence Independent with basic ADLs;Independent;Independent with gait;Independent with transfers   Vocation Part time employment   Leisure works at [employer]; has to do 6 hour shifts, needs to be up on her feet a lot but does get a chance to sit down    Observation/Other Assessments   Observations ligament rests R knee appear WNL    Focus on Therapeutic Outcomes (FOTO)  61% limited    Posture/Postural Control   Posture Comments flexed at hips, forward head with B IR shoulders, possible genu recurvatum, pronation of both feet, flat Thoracic spine, increaed lumbar lordosis    AROM   Right Hip External Rotation  --  WFL    Right Hip Internal Rotation  32   Left Hip External Rotation  --  WFL    Left Hip Internal Rotation  30   Right Knee Extension -3  approx 3-4 degrees active genu recurvatum    Right Knee Flexion 120   Strength   Right Hip Flexion 4-/5   Right Hip Extension 4-/5   Right Hip ABduction 3/5   Left Hip Flexion 3+/5   Left Hip Extension 4-/5   Left Hip ABduction 4-/5   Right Knee Flexion 4/5   Right Knee Extension 4/5   Left Knee Flexion 4+/5   Left Knee Extension 4/5   Right Ankle Dorsiflexion 5/5   Left Ankle Dorsiflexion 5/5   Ambulation/Gait   Gait Comments proximal muscle weakness, pronation B, reduced stance R LE/step length L LE, reduced gait speed                           PT Education - 11/24/14 1645    Education provided Yes   Education Details prognosis, plan of care, HEP    Person(s) Educated Patient   Methods  Explanation;Handout   Comprehension Verbalized understanding;Returned demonstration          PT Short Term Goals - 11/24/14 1656    PT SHORT TERM GOAL #1   Title Patient will experience no more than 3/10 pain R knee with  all functional standing tasks and activities of unlimited duration    Time 3   Period Weeks   Status New   PT SHORT TERM GOAL #2   Title Patient to demonstrate WNL hip IR motion to improve overall mechanics and reduce stress on knees during mobility    Time 3   Period Weeks   Status New   PT SHORT TERM GOAL #3   Title Patient to be aware of posture and static mechanics at all times to avoid increased stress on joints through genu recurvatum of knees    Time 3   Period Weeks   Status New   PT SHORT TERM GOAL #4   Title Patient to be independent in correctly and consistently performing appropriate HEP, to be updated PRN    Time 3   Period Weeks   Status New           PT Long Term Goals - 11/24/14 1658    PT LONG TERM GOAL #1   Title Patient to be able to ambulate and perform functional standing tasks for unlimited periods of time with R knee pain no more than 2/10   Time 7   Period Weeks   Status New   PT LONG TERM GOAL #2   Title Patient to demonstrate 5/5 muscle strength bilateral lower extremities as well as at least 4/5 strength in proximal musculature    Time 7   Period Weeks   Status New   PT LONG TERM GOAL #3   Title Patient to be able to ascend and descend full flight of stairs reciprocally with no raling and pain in R knee no more than 2/10, no circumduction or unsteadiness    Time 7   Period Weeks   Status New   PT LONG TERM GOAL #4   Title Patient to report that she has been performing at least 30 minutes of light-moderate intensity physical activity at least 3 times per week in order to maintain functional gains, promote improved health habits, and reduce chances of recurrence of knee pain    Time 7   Period Weeks   Status New                Plan - 11/24/14 1651    Clinical Impression Statement Patient presents with R knee pain that started about a month ago ; she reports that she has been seeing Dr. Noemi Chapel and that initially he thought she may have had medial gastroc tendonitis due to location of pain however she states that MD told her MRI showed no evidence of tendonitis with follow-up imaging, and that she is most concerned with her knee pain. Noted inclreased fluid volume L LE with venous distension, which patient reports she is on fluid medicine for. Noted genu recervatum and suspect that patient may be hyper-extending knees in stance. At this time patient will benefit from skilled PT services to address her functional limitations and assist her in reaching an overall improved level of function.    Pt will benefit from skilled therapeutic intervention in order to improve on the following deficits Abnormal gait;Hypomobility;Obesity;Decreased strength;Pain;Difficulty walking;Improper body mechanics;Decreased coordination;Impaired flexibility;Postural dysfunction   Rehab Potential Good   Clinical Impairments Affecting Rehab Potential pre-disposed to joint pain due to weight    PT Frequency 2x / week   PT Duration Other (comment)  7 weeks    PT Treatment/Interventions ADLs/Self Care Home Management;Cryotherapy;Gait training;Stair training;Functional mobility training;Therapeutic activities;Therapeutic exercise;Balance training;Neuromuscular re-education;Patient/family education;Manual techniques;Taping  PT Next Visit Plan review HEP and goals; functional stretching and strengthening as tolerated, manual PRN. Discuss possible lymphedema or compression stocking referral with patient before sending referral to MD due to fluid buildup L LE.    PT Home Exercise Plan given    Consulted and Agree with Plan of Care Patient          G-Codes - 2014-11-25 1704    Functional Assessment Tool Used FOTO 61% limited     Functional Limitation Mobility: Walking and moving around   Mobility: Walking and Moving Around Current Status (L8756) At least 60 percent but less than 80 percent impaired, limited or restricted   Mobility: Walking and Moving Around Goal Status (E3329) At least 40 percent but less than 60 percent impaired, limited or restricted       Problem List Patient Active Problem List   Diagnosis Date Noted  . Diverticulosis of colon without hemorrhage   . Hx of adenomatous colonic polyps 12/01/2013  . Pain in joint, shoulder region 06/24/2013  . Muscle weakness (generalized) 06/24/2013  . Incomplete rotator cuff tear   . Diabetes mellitus without complication ([place])   . PONV (postoperative nausea and vomiting)   . Fluid retention   . Hypertension     Deniece Ree PT, DPT [place] [address] [place], [place], [address] Phone: [phone]   Fax:  [phone]

## 2014-11-24 NOTE — Therapy (Signed)
[place], [place], [address] Phone: [phone]   Fax:  [phone]  Patient Details  Name: Amy Tran MRN: [id number] Date of Birth: 1955/07/28 Referring Provider:  Elsie Saas, MD  Encounter Date: 11/24/2014   Medicaid as secondary insurance and application was submitted today. Patient is also under Medicare insurance and will be able to continue with skilled PT services should Medicaid deny application.   Deniece Ree PT, DPT [id number]  [place] [address] [place], [place], [address] Phone: [phone]   Fax:  [phone]

## 2014-11-29 ENCOUNTER — Ambulatory Visit (HOSPITAL_COMMUNITY): Payer: Medicare Other

## 2014-11-29 DIAGNOSIS — M94261 Chondromalacia, right knee: Secondary | ICD-10-CM | POA: Diagnosis not present

## 2014-11-29 DIAGNOSIS — M21869 Other specified acquired deformities of unspecified lower leg: Secondary | ICD-10-CM | POA: Diagnosis not present

## 2014-11-29 DIAGNOSIS — R262 Difficulty in walking, not elsewhere classified: Secondary | ICD-10-CM | POA: Diagnosis not present

## 2014-11-29 DIAGNOSIS — M25561 Pain in right knee: Secondary | ICD-10-CM | POA: Diagnosis not present

## 2014-11-29 DIAGNOSIS — M25659 Stiffness of unspecified hip, not elsewhere classified: Secondary | ICD-10-CM

## 2014-11-29 DIAGNOSIS — R6889 Other general symptoms and signs: Secondary | ICD-10-CM

## 2014-11-29 DIAGNOSIS — M6281 Muscle weakness (generalized): Secondary | ICD-10-CM | POA: Diagnosis not present

## 2014-11-29 DIAGNOSIS — Z658 Other specified problems related to psychosocial circumstances: Secondary | ICD-10-CM | POA: Diagnosis not present

## 2014-11-29 DIAGNOSIS — Z789 Other specified health status: Secondary | ICD-10-CM

## 2014-11-29 NOTE — Therapy (Signed)
[place], [place], [address] Phone: [phone]   Fax:  [phone]  Physical Therapy Treatment  Patient Details  Name: Amy Tran MRN: [id number] Date of Birth: Oct 06, 1955 No Data Recorded  Encounter Date: 11/29/2014      PT End of Session - 11/29/14 1453    Visit Number 2   Number of Visits 14   Date for PT Re-Evaluation 12/22/14   Authorization Type Medicare and Medicaid    Authorization Time Period 11/24/14 to 01/24/15   Authorization - Visit Number 2   Authorization - Number of Visits 10   PT Start Time 1430   PT Stop Time 1512   PT Time Calculation (min) 42 min   Activity Tolerance Patient tolerated treatment well   Behavior During Therapy [facility] for tasks assessed/performed      Past Medical History  Diagnosis Date  . Hypertension   . Fluid retention   . Diabetes mellitus without complication   . PONV (postoperative nausea and vomiting)   . Wears glasses   . Wears partial dentures     bottom  . Incomplete rotator cuff tear   . Cancer     multiple skin cancers    Past Surgical History  Procedure Laterality Date  . Gastric bypass  2004  . Carpal tunnel release  2001    left  . Cholecystectomy    . Neck surgery  1998  . Shoulder surgery  1999    left  . Ercp  2002  . Colonoscopy  2007    SKA:[id number] internal hemorrhoids. Diminutive rectal polyp at 10 cm, cold biopsied/removed. The remainder of the rectal mucosa appeared normal Swallow left-sided diverticula. Diminutive polyp at the splenic flexure cold biopsied/removed (adenomatous)  . Appendectomy    . Shoulder arthroscopy with subacromial decompression Left 06/23/2013    Procedure: LEFT SHOULDER ARTHROSCOPY WITH DEBRIDEMENT ROTATOR CUFF AND LABRUM;  Surgeon: Lorn Junes, MD;  Location: [place];  Service: Orthopedics;  Laterality: Left;  . Colonoscopy N/A 12/28/2013    Procedure: COLONOSCOPY;  Surgeon: Daneil Dolin, MD;  [facility];  Service: Endoscopy;  Laterality: N/A;  730 - moved to 8:30 - Ginger notified pt    There were no vitals filed for this visit.  Visit Diagnosis:  Chondromalacia of knee, right  Right knee pain  Genu recurvatum, unspecified laterality  Difficulty walking  Difficulty navigating stairs  Muscle weakness  Hip stiffness, unspecified laterality      Subjective Assessment - 11/29/14 1425    Subjective Rt knee pain scale 6/10 achey.  Reports compliance with HEP daily with no questions about any exercise.     Pertinent History Knee started hurting about 4 weeks ago insidiously; patient reports that she works on [employer] and has to be on her feet and standing a lot. Has been on medications for her knee but has not received any other treatments.   Patient Stated Goals reduce pain    Currently in Pain? Yes   Pain Score 6    Pain Location Knee   Pain Orientation Right   Pain Descriptors / Indicators Aching            [facility] Adult PT Treatment/Exercise - 11/29/14 0001    Exercises   Exercises Knee/Hip   Knee/Hip Exercises: Stretches   Active Hamstring Stretch Both;2 reps;30 seconds   Active Hamstring Stretch Limitations 12in step   Gastroc Stretch 3 reps;30 seconds  Gastroc Stretch Limitations slant board   Knee/Hip Exercises: Standing   Heel Raises Both;10 reps   Heel Raises Limitations Toe raises 10x   Knee Flexion Both;10 reps   Hip Abduction Both;10 reps   Hip Extension Both;10 reps   Other Standing Knee Exercises Education on proper standing posture to reduce hyperextension knee   Knee/Hip Exercises: Supine   Quad Sets Right;10 reps   Short Arc Quad Sets Both;10 reps   Bridges Both;10 reps   Knee/Hip Exercises: Sidelying   Hip ABduction Both;10 reps   Hip ABduction Limitations cueing for form   Knee/Hip Exercises: Prone   Hamstring Curl 10 reps   Hip Extension Both;10 reps           PT Short Term Goals - 11/24/14 1656     PT SHORT TERM GOAL #1   Title Patient will experience no more than 3/10 pain R knee with all functional standing tasks and activities of unlimited duration    Time 3   Period Weeks   Status New   PT SHORT TERM GOAL #2   Title Patient to demonstrate WNL hip IR motion to improve overall mechanics and reduce stress on knees during mobility    Time 3   Period Weeks   Status New   PT SHORT TERM GOAL #3   Title Patient to be aware of posture and static mechanics at all times to avoid increased stress on joints through genu recurvatum of knees    Time 3   Period Weeks   Status New   PT SHORT TERM GOAL #4   Title Patient to be independent in correctly and consistently performing appropriate HEP, to be updated PRN    Time 3   Period Weeks   Status New           PT Long Term Goals - 11/24/14 1658    PT LONG TERM GOAL #1   Title Patient to be able to ambulate and perform functional standing tasks for unlimited periods of time with R knee pain no more than 2/10   Time 7   Period Weeks   Status New   PT LONG TERM GOAL #2   Title Patient to demonstrate 5/5 muscle strength bilateral lower extremities as well as at least 4/5 strength in proximal musculature    Time 7   Period Weeks   Status New   PT LONG TERM GOAL #3   Title Patient to be able to ascend and descend full flight of stairs reciprocally with no raling and pain in R knee no more than 2/10, no circumduction or unsteadiness    Time 7   Period Weeks   Status New   PT LONG TERM GOAL #4   Title Patient to report that she has been performing at least 30 minutes of light-moderate intensity physical activity at least 3 times per week in order to maintain functional gains, promote improved health habits, and reduce chances of recurrence of knee pain    Time 7   Period Weeks   Status New               Plan - 11/29/14 1454    Clinical Impression Statement Reviewed goals, compliance with HEP without questions concerning  any exercise, and copy of evaluation noted.  Upon entrance noted increased fluid volume Lt LE, discussion held about the lymphatic system and evaluation/treatment for lymphedema if appropriate.  Referral sent to [facility].  Session focus on education on proper  standing posture to reduce hyperextension Bil knee upon rest and functional strengthening, pt able to demonstrate appropriate techniques with all exercises following cueing for form and technique.  End of session pt reported pain reduced to 4/10, pt encouraged to apply ice with elevation for pain and edema control.     PT Next Visit Plan Continue session focus on functional stretching and strengthening as tolerated, manual PRN. F/U with referral for lymphedema or compression stocking referral with patient before sending referral to MD due to fluid buildup L LE.         Problem List Patient Active Problem List   Diagnosis Date Noted  . Diverticulosis of colon without hemorrhage   . Hx of adenomatous colonic polyps 12/01/2013  . Pain in joint, shoulder region 06/24/2013  . Muscle weakness (generalized) 06/24/2013  . Incomplete rotator cuff tear   . Diabetes mellitus without complication ([place])   . PONV (postoperative nausea and vomiting)   . Fluid retention   . Hypertension    Ihor Austin, [place]; [place]  Aldona Lento 11/29/2014, 3:15 PM  [place], [place], [address] Phone: [phone]   Fax:  [phone]  Name: ANGELO CAROLL MRN: [id number] Date of Birth: 03/02/1955

## 2014-12-02 ENCOUNTER — Ambulatory Visit (HOSPITAL_COMMUNITY): Payer: Medicare Other | Admitting: Physical Therapy

## 2014-12-02 DIAGNOSIS — R262 Difficulty in walking, not elsewhere classified: Secondary | ICD-10-CM | POA: Diagnosis not present

## 2014-12-02 DIAGNOSIS — M25659 Stiffness of unspecified hip, not elsewhere classified: Secondary | ICD-10-CM | POA: Diagnosis not present

## 2014-12-02 DIAGNOSIS — M6281 Muscle weakness (generalized): Secondary | ICD-10-CM | POA: Diagnosis not present

## 2014-12-02 DIAGNOSIS — M25561 Pain in right knee: Secondary | ICD-10-CM

## 2014-12-02 DIAGNOSIS — M94261 Chondromalacia, right knee: Secondary | ICD-10-CM | POA: Diagnosis not present

## 2014-12-02 DIAGNOSIS — Z658 Other specified problems related to psychosocial circumstances: Secondary | ICD-10-CM | POA: Diagnosis not present

## 2014-12-02 DIAGNOSIS — M21869 Other specified acquired deformities of unspecified lower leg: Secondary | ICD-10-CM

## 2014-12-02 NOTE — Therapy (Signed)
[facility] [place], [place], [address] Phone: [phone]   Fax:  [phone]  Physical Therapy Treatment  Patient Details  Name: Amy Tran MRN: [id number] Date of Birth: [birth year] No Data Recorded  Encounter Date: 12/02/2014      PT End of Session - 12/02/14 0842    Visit Number 3   Number of Visits 14   Date for PT Re-Evaluation 12/22/14   Authorization Type Medicare and Medicaid    Authorization Time Period 11/24/14 to 01/24/15   Authorization - Visit Number 3   Authorization - Number of Visits 10   PT Start Time 0800   PT Stop Time 0842   PT Time Calculation (min) 42 min   Activity Tolerance Patient tolerated treatment well   Behavior During Therapy [facility] for tasks assessed/performed      Past Medical History  Diagnosis Date  . Hypertension   . Fluid retention   . Diabetes mellitus without complication   . PONV (postoperative nausea and vomiting)   . Wears glasses   . Wears partial dentures     bottom  . Incomplete rotator cuff tear   . Cancer     multiple skin cancers    Past Surgical History  Procedure Laterality Date  . Gastric bypass  2004  . Carpal tunnel release  2001    left  . Cholecystectomy    . Neck surgery  1998  . Shoulder surgery  1999    left  . Ercp  2002  . Colonoscopy  2007    VOZ:[id number] internal hemorrhoids. Diminutive rectal polyp at 10 cm, cold biopsied/removed. The remainder of the rectal mucosa appeared normal Swallow left-sided diverticula. Diminutive polyp at the splenic flexure cold biopsied/removed (adenomatous)  . Appendectomy    . Shoulder arthroscopy with subacromial decompression Left 06/23/2013    Procedure: LEFT SHOULDER ARTHROSCOPY WITH DEBRIDEMENT ROTATOR CUFF AND LABRUM;  Surgeon: Lorn Junes, MD;  Location: [facility];  Service: Orthopedics;  Laterality: Left;  . Colonoscopy N/A 12/28/2013    Procedure: COLONOSCOPY;  Surgeon: Daneil Dolin, MD;  [facility];  Service: Endoscopy;  Laterality: N/A;  730 - moved to 8:30 - Ginger notified pt    There were no vitals filed for this visit.  Visit Diagnosis:  Chondromalacia of knee, right  Right knee pain  Genu recurvatum, unspecified laterality  Difficulty walking      Subjective Assessment - 12/02/14 0802    Subjective Pt reports that she has some pain in her right knee today. She has been starting to get cramps in her knee at night.    Currently in Pain? Yes   Pain Score 4    Pain Location Knee   Pain Orientation Right                         [facility] Adult PT Treatment/Exercise - 12/02/14 0001    Knee/Hip Exercises: Stretches   Active Hamstring Stretch 3 reps;30 seconds   Active Hamstring Stretch Limitations 12in step   Gastroc Stretch 3 reps;30 seconds   Gastroc Stretch Limitations slant board   Knee/Hip Exercises: Standing   Heel Raises Both;15 reps   Heel Raises Limitations Toe raises x 15   Knee Flexion Both;15 reps   Forward Lunges 10 reps;Both   Forward Lunges Limitations 6" step   Side Lunges 10 reps;Both   Side Lunges Limitations 6" step  Hip Abduction 15 reps;Both   Hip Extension Both;15 reps   Lateral Step Up 10 reps;Step Height: 4"   Forward Step Up 10 reps;Step Height: 4"   Other Standing Knee Exercises sidestepping with RTB x 2RT   Knee/Hip Exercises: Seated   Sit to Sand 10 reps  cueing to prevent ER of BLE   Knee/Hip Exercises: Supine   Quad Sets 15 reps;Right   Quad Sets Limitations 3" hold   Short Arc Quad Sets 15 reps   Bridges 15 reps   Straight Leg Raises Both;10 reps   Straight Leg Raise with External Rotation Both;10 reps   Knee/Hip Exercises: Sidelying   Hip ABduction Both;15 reps                  PT Short Term Goals - 11/24/14 1656    PT SHORT TERM GOAL #1   Title Patient will experience no more than 3/10 pain R knee with all functional standing tasks and activities of unlimited  duration    Time 3   Period Weeks   Status New   PT SHORT TERM GOAL #2   Title Patient to demonstrate WNL hip IR motion to improve overall mechanics and reduce stress on knees during mobility    Time 3   Period Weeks   Status New   PT SHORT TERM GOAL #3   Title Patient to be aware of posture and static mechanics at all times to avoid increased stress on joints through genu recurvatum of knees    Time 3   Period Weeks   Status New   PT SHORT TERM GOAL #4   Title Patient to be independent in correctly and consistently performing appropriate HEP, to be updated PRN    Time 3   Period Weeks   Status New           PT Long Term Goals - 11/24/14 1658    PT LONG TERM GOAL #1   Title Patient to be able to ambulate and perform functional standing tasks for unlimited periods of time with R knee pain no more than 2/10   Time 7   Period Weeks   Status New   PT LONG TERM GOAL #2   Title Patient to demonstrate 5/5 muscle strength bilateral lower extremities as well as at least 4/5 strength in proximal musculature    Time 7   Period Weeks   Status New   PT LONG TERM GOAL #3   Title Patient to be able to ascend and descend full flight of stairs reciprocally with no raling and pain in R knee no more than 2/10, no circumduction or unsteadiness    Time 7   Period Weeks   Status New   PT LONG TERM GOAL #4   Title Patient to report that she has been performing at least 30 minutes of light-moderate intensity physical activity at least 3 times per week in order to maintain functional gains, promote improved health habits, and reduce chances of recurrence of knee pain    Time 7   Period Weeks   Status New               Plan - 12/02/14 7948    Clinical Impression Statement Continued with functional strengthening today, progressing standing strengthening to include lunges, step ups, and sidestepping. Pt required verbal and visual cueing for proper form during side and forward lunges for  proper form. Pt was able to complete all therex today with no  c/o increased pain, reported that she was fatigued post treatment.    PT Next Visit Plan Continue with functional strengthening in standing, add TKE        Problem List Patient Active Problem List   Diagnosis Date Noted  . Diverticulosis of colon without hemorrhage   . Hx of adenomatous colonic polyps 12/01/2013  . Pain in joint, shoulder region 06/24/2013  . Muscle weakness (generalized) 06/24/2013  . Incomplete rotator cuff tear   . Diabetes mellitus without complication ([place])   . PONV (postoperative nausea and vomiting)   . Fluid retention   . Hypertension     Hilma Favors, PT, DPT [phone] 12/02/2014, 8:46 AM  [facility] [place], [place], [address] Phone: [phone]   Fax:  [phone]  Name: Amy Tran MRN: [id number] Date of Birth: [birth year]

## 2014-12-06 ENCOUNTER — Ambulatory Visit (HOSPITAL_COMMUNITY): Payer: Medicare Other

## 2014-12-09 ENCOUNTER — Ambulatory Visit (HOSPITAL_COMMUNITY): Payer: Medicare Other

## 2014-12-09 DIAGNOSIS — M25561 Pain in right knee: Secondary | ICD-10-CM

## 2014-12-09 DIAGNOSIS — R262 Difficulty in walking, not elsewhere classified: Secondary | ICD-10-CM | POA: Diagnosis not present

## 2014-12-09 DIAGNOSIS — M6281 Muscle weakness (generalized): Secondary | ICD-10-CM | POA: Diagnosis not present

## 2014-12-09 DIAGNOSIS — M94261 Chondromalacia, right knee: Secondary | ICD-10-CM | POA: Diagnosis not present

## 2014-12-09 DIAGNOSIS — R6889 Other general symptoms and signs: Secondary | ICD-10-CM

## 2014-12-09 DIAGNOSIS — Z789 Other specified health status: Secondary | ICD-10-CM

## 2014-12-09 DIAGNOSIS — M21869 Other specified acquired deformities of unspecified lower leg: Secondary | ICD-10-CM | POA: Diagnosis not present

## 2014-12-09 DIAGNOSIS — M25659 Stiffness of unspecified hip, not elsewhere classified: Secondary | ICD-10-CM | POA: Diagnosis not present

## 2014-12-09 DIAGNOSIS — Z658 Other specified problems related to psychosocial circumstances: Secondary | ICD-10-CM | POA: Diagnosis not present

## 2014-12-09 NOTE — Therapy (Signed)
[place], [place], [address] Phone: [phone]   Fax:  [phone]  Physical Therapy Treatment  Patient Details  Name: Amy Tran MRN: [id number] Date of Birth: [birth year] Referring Provider: Noemi Chapel  Encounter Date: 12/09/2014      PT End of Session - 12/09/14 0848    Visit Number 4   Number of Visits 14   Date for PT Re-Evaluation 12/22/14   Authorization Type Medicare and Medicaid    Authorization Time Period 11/24/14 to 01/24/15   Authorization - Visit Number 4   Authorization - Number of Visits 10   PT Start Time 3664   PT Stop Time 0930   PT Time Calculation (min) 46 min   Activity Tolerance Patient tolerated treatment well   Behavior During Therapy [facility] for tasks assessed/performed      Past Medical History  Diagnosis Date  . Hypertension   . Fluid retention   . Diabetes mellitus without complication   . PONV (postoperative nausea and vomiting)   . Wears glasses   . Wears partial dentures     bottom  . Incomplete rotator cuff tear   . Cancer     multiple skin cancers    Past Surgical History  Procedure Laterality Date  . Gastric bypass  2004  . Carpal tunnel release  2001    left  . Cholecystectomy    . Neck surgery  1998  . Shoulder surgery  1999    left  . Ercp  2002  . Colonoscopy  2007    QIH:[id number] internal hemorrhoids. Diminutive rectal polyp at 10 cm, cold biopsied/removed. The remainder of the rectal mucosa appeared normal Swallow left-sided diverticula. Diminutive polyp at the splenic flexure cold biopsied/removed (adenomatous)  . Appendectomy    . Shoulder arthroscopy with subacromial decompression Left 06/23/2013    Procedure: LEFT SHOULDER ARTHROSCOPY WITH DEBRIDEMENT ROTATOR CUFF AND LABRUM;  Surgeon: Lorn Junes, MD;  Location: [facility];  Service: Orthopedics;  Laterality: Left;  . Colonoscopy N/A 12/28/2013    Procedure: COLONOSCOPY;   Surgeon: Daneil Dolin, MD;  [facility];  Service: Endoscopy;  Laterality: N/A;  730 - moved to 8:30 - Ginger notified pt    There were no vitals filed for this visit.  Visit Diagnosis:  Chondromalacia of knee, right  Right knee pain  Genu recurvatum, unspecified laterality  Difficulty walking  Difficulty navigating stairs  Muscle weakness  Hip stiffness, unspecified laterality      Subjective Assessment - 12/09/14 0848    Subjective Pt stated achey Rt knee today, pain scale 6/10.     Currently in Pain? Yes   Pain Score 6    Pain Location Knee   Pain Orientation Right   Pain Descriptors / Indicators Aching            [facility] PT Assessment - 12/09/14 0001    Assessment   Medical Diagnosis R knee grade IV patella femoral chondromalacia    Referring Provider Noemi Chapel   Onset Date/Surgical Date 10/27/14   Next MD Visit patient to finish course of PT then call MD Noemi Chapel for follow-up                      [facility] Adult PT Treatment/Exercise - 12/09/14 0001    Knee/Hip Exercises: Stretches   Active Hamstring Stretch 3 reps;30 seconds   Active Hamstring Stretch Limitations 12in step   Gastroc Stretch 3  reps;30 seconds   Gastroc Stretch Limitations slant board   Knee/Hip Exercises: Aerobic   [place] L2, resistance 2 x 8 min   Knee/Hip Exercises: Standing   Heel Raises Both;15 reps   Heel Raises Limitations Toe raises x 15   Forward Lunges 15 reps;Both   Forward Lunges Limitations 6" step   Side Lunges 10 reps;Both   Side Lunges Limitations 6" step   Terminal Knee Extension Limitations 15x 3" RTB   Hip Abduction 15 reps;Both   Hip Extension Both;15 reps   Lateral Step Up Right;15 reps;Hand Hold: 2;Step Height: 4"   Forward Step Up Right;15 reps;Hand Hold: 1;Step Height: 6"   Functional Squat 10 reps                  PT Short Term Goals - 11/24/14 1656    PT SHORT TERM GOAL #1   Title Patient will experience no more than 3/10  pain R knee with all functional standing tasks and activities of unlimited duration    Time 3   Period Weeks   Status New   PT SHORT TERM GOAL #2   Title Patient to demonstrate WNL hip IR motion to improve overall mechanics and reduce stress on knees during mobility    Time 3   Period Weeks   Status New   PT SHORT TERM GOAL #3   Title Patient to be aware of posture and static mechanics at all times to avoid increased stress on joints through genu recurvatum of knees    Time 3   Period Weeks   Status New   PT SHORT TERM GOAL #4   Title Patient to be independent in correctly and consistently performing appropriate HEP, to be updated PRN    Time 3   Period Weeks   Status New           PT Long Term Goals - 11/24/14 1658    PT LONG TERM GOAL #1   Title Patient to be able to ambulate and perform functional standing tasks for unlimited periods of time with R knee pain no more than 2/10   Time 7   Period Weeks   Status New   PT LONG TERM GOAL #2   Title Patient to demonstrate 5/5 muscle strength bilateral lower extremities as well as at least 4/5 strength in proximal musculature    Time 7   Period Weeks   Status New   PT LONG TERM GOAL #3   Title Patient to be able to ascend and descend full flight of stairs reciprocally with no raling and pain in R knee no more than 2/10, no circumduction or unsteadiness    Time 7   Period Weeks   Status New   PT LONG TERM GOAL #4   Title Patient to report that she has been performing at least 30 minutes of light-moderate intensity physical activity at least 3 times per week in order to maintain functional gains, promote improved health habits, and reduce chances of recurrence of knee pain    Time 7   Period Weeks   Status New               Plan - 12/09/14 0900    Clinical Impression Statement Session focus on functional strengthening, added functional squats to improve gluteal strenghtening with cueing to improve form and weight  bearing. Discussion held about proper lifting techniques following report of lifting objects with work.  Pt able to verbalize and demonstrate  appropriate technique.  Added TKE for quad strengthening, pt improved stanidng stance without hyperextending knee.  Pt able to complete all exercises with no reports of increased pain, was limited by fatigue with activity.  Added Nustep to improve activity tolerance.     PT Next Visit Plan Continue with functional strengthening in standing        Problem List Patient Active Problem List   Diagnosis Date Noted  . Diverticulosis of colon without hemorrhage   . Hx of adenomatous colonic polyps 12/01/2013  . Pain in joint, shoulder region 06/24/2013  . Muscle weakness (generalized) 06/24/2013  . Incomplete rotator cuff tear   . Diabetes mellitus without complication ([place])   . PONV (postoperative nausea and vomiting)   . Fluid retention   . Hypertension    Ihor Austin, [place]; [place]  Aldona Lento 12/09/2014, 9:24 AM  [facility] [address] [place], [place], [address] Phone: [phone]   Fax:  [phone]  Name: Amy Tran MRN: [id number] Date of Birth: 11-27-1955

## 2014-12-12 DIAGNOSIS — H40013 Open angle with borderline findings, low risk, bilateral: Secondary | ICD-10-CM | POA: Diagnosis not present

## 2014-12-12 DIAGNOSIS — E119 Type 2 diabetes mellitus without complications: Secondary | ICD-10-CM | POA: Diagnosis not present

## 2014-12-13 ENCOUNTER — Ambulatory Visit (HOSPITAL_COMMUNITY): Payer: Medicare Other | Admitting: Physical Therapy

## 2014-12-15 ENCOUNTER — Ambulatory Visit (HOSPITAL_COMMUNITY): Payer: Medicare Other | Attending: Orthopedic Surgery | Admitting: Physical Therapy

## 2014-12-15 DIAGNOSIS — R262 Difficulty in walking, not elsewhere classified: Secondary | ICD-10-CM | POA: Diagnosis not present

## 2014-12-15 DIAGNOSIS — M94261 Chondromalacia, right knee: Secondary | ICD-10-CM | POA: Diagnosis not present

## 2014-12-15 DIAGNOSIS — R6889 Other general symptoms and signs: Secondary | ICD-10-CM

## 2014-12-15 DIAGNOSIS — M25659 Stiffness of unspecified hip, not elsewhere classified: Secondary | ICD-10-CM

## 2014-12-15 DIAGNOSIS — Z658 Other specified problems related to psychosocial circumstances: Secondary | ICD-10-CM | POA: Insufficient documentation

## 2014-12-15 DIAGNOSIS — M25562 Pain in left knee: Secondary | ICD-10-CM | POA: Insufficient documentation

## 2014-12-15 DIAGNOSIS — Z789 Other specified health status: Secondary | ICD-10-CM

## 2014-12-15 DIAGNOSIS — I890 Lymphedema, not elsewhere classified: Secondary | ICD-10-CM | POA: Diagnosis not present

## 2014-12-15 DIAGNOSIS — M25561 Pain in right knee: Secondary | ICD-10-CM | POA: Diagnosis not present

## 2014-12-15 DIAGNOSIS — M21869 Other specified acquired deformities of unspecified lower leg: Secondary | ICD-10-CM

## 2014-12-15 DIAGNOSIS — M6281 Muscle weakness (generalized): Secondary | ICD-10-CM | POA: Diagnosis not present

## 2014-12-15 NOTE — Therapy (Signed)
[place], [place], [address] Phone: [phone]   Fax:  [phone]  Physical Therapy Treatment  Patient Details  Name: Amy Tran MRN: [id number] Date of Birth: Oct 07, 1955 Referring Provider: Noemi Chapel  Encounter Date: 12/15/2014      PT End of Session - 12/15/14 0923    Visit Number 5   Number of Visits 14   Date for PT Re-Evaluation 12/22/14   Authorization Type Medicare and Medicaid    Authorization Time Period 11/24/14 to 01/24/15   Authorization - Visit Number 5   Authorization - Number of Visits 10   PT Start Time 0845   PT Stop Time 0932   PT Time Calculation (min) 47 min   Activity Tolerance Patient tolerated treatment well   Behavior During Therapy [facility] for tasks assessed/performed      Past Medical History  Diagnosis Date  . Hypertension   . Fluid retention   . Diabetes mellitus without complication   . PONV (postoperative nausea and vomiting)   . Wears glasses   . Wears partial dentures     bottom  . Incomplete rotator cuff tear   . Cancer     multiple skin cancers    Past Surgical History  Procedure Laterality Date  . Gastric bypass  2004  . Carpal tunnel release  2001    left  . Cholecystectomy    . Neck surgery  1998  . Shoulder surgery  1999    left  . Ercp  2002  . Colonoscopy  2007    [id number] internal hemorrhoids. Diminutive rectal polyp at 10 cm, cold biopsied/removed. The remainder of the rectal mucosa appeared normal Swallow left-sided diverticula. Diminutive polyp at the splenic flexure cold biopsied/removed (adenomatous)  . Appendectomy    . Shoulder arthroscopy with subacromial decompression Left 06/23/2013    Procedure: LEFT SHOULDER ARTHROSCOPY WITH DEBRIDEMENT ROTATOR CUFF AND LABRUM;  Surgeon: Lorn Junes, MD;  Location: [facility];  Service: Orthopedics;  Laterality: Left;  . Colonoscopy N/A 12/28/2013    Procedure: COLONOSCOPY;   Surgeon: Daneil Dolin, MD;  [facility];  Service: Endoscopy;  Laterality: N/A;  730 - moved to 8:30 - Ginger notified pt    There were no vitals filed for this visit.  Visit Diagnosis:  Chondromalacia of knee, right  Right knee pain  Genu recurvatum, unspecified laterality  Difficulty walking  Difficulty navigating stairs  Muscle weakness  Hip stiffness, unspecified laterality      Subjective Assessment - 12/15/14 0853    Subjective Pt states she hurt for 2 days following last session due to the bike.  Reports 5/10 pain currently in her Rt knee.   Currently in Pain? Yes   Pain Score 5    Pain Location Knee   Pain Orientation Right   Pain Descriptors / Indicators Aching                         [facility] Adult PT Treatment/Exercise - 12/15/14 0855    Knee/Hip Exercises: Stretches   Active Hamstring Stretch 3 reps;30 seconds   Active Hamstring Stretch Limitations 12in step   Gastroc Stretch 3 reps;30 seconds   Gastroc Stretch Limitations slant board   Knee/Hip Exercises: Aerobic   [place] L2, resistance 2 x 8 min   Knee/Hip Exercises: Standing   Heel Raises Both;15 reps   Heel Raises Limitations Toe raises x 15  Forward Lunges Both;10 reps   Forward Lunges Limitations 4" step   Side Lunges Both;10 reps   Side Lunges Limitations 4" step   Hip Abduction 15 reps;Both   Hip Extension Both;15 reps   Lateral Step Up Right;15 reps;Hand Hold: 2;Step Height: 4"   Forward Step Up Right;15 reps;Hand Hold: 1;Step Height: 6"   Step Down Both;10 reps;Step Height: 4";Hand Hold: 1   Functional Squat 10 reps   SLS with Vectors 5X3" bilaterally   Other Standing Knee Exercises sidestepping with RTB x 2RT                  PT Short Term Goals - 11/24/14 1656    PT SHORT TERM GOAL #1   Title Patient will experience no more than 3/10 pain R knee with all functional standing tasks and activities of unlimited duration    Time 3   Period Weeks    Status New   PT SHORT TERM GOAL #2   Title Patient to demonstrate WNL hip IR motion to improve overall mechanics and reduce stress on knees during mobility    Time 3   Period Weeks   Status New   PT SHORT TERM GOAL #3   Title Patient to be aware of posture and static mechanics at all times to avoid increased stress on joints through genu recurvatum of knees    Time 3   Period Weeks   Status New   PT SHORT TERM GOAL #4   Title Patient to be independent in correctly and consistently performing appropriate HEP, to be updated PRN    Time 3   Period Weeks   Status New           PT Long Term Goals - 11/24/14 1658    PT LONG TERM GOAL #1   Title Patient to be able to ambulate and perform functional standing tasks for unlimited periods of time with R knee pain no more than 2/10   Time 7   Period Weeks   Status New   PT LONG TERM GOAL #2   Title Patient to demonstrate 5/5 muscle strength bilateral lower extremities as well as at least 4/5 strength in proximal musculature    Time 7   Period Weeks   Status New   PT LONG TERM GOAL #3   Title Patient to be able to ascend and descend full flight of stairs reciprocally with no raling and pain in R knee no more than 2/10, no circumduction or unsteadiness    Time 7   Period Weeks   Status New   PT LONG TERM GOAL #4   Title Patient to report that she has been performing at least 30 minutes of light-moderate intensity physical activity at least 3 times per week in order to maintain functional gains, promote improved health habits, and reduce chances of recurrence of knee pain    Time 7   Period Weeks   Status New               Plan - 12/15/14 3244    Clinical Impression Statement Continued focus on increasing functional strength of bilateral LE's.  PRogressed to 4" step wtih lunges and added forward step downs and vector stance to work on stability and eccentric control.  All mat exercises are now completed independently at  home.  PT requred minimal cues for form and control during session today.  No increased pain reported at end of session.    PT Next  Visit Plan Continue with functional strengthening in standing.  resume sit to stand        Problem List Patient Active Problem List   Diagnosis Date Noted  . Diverticulosis of colon without hemorrhage   . Hx of adenomatous colonic polyps 12/01/2013  . Pain in joint, shoulder region 06/24/2013  . Muscle weakness (generalized) 06/24/2013  . Incomplete rotator cuff tear   . Diabetes mellitus without complication ([place])   . PONV (postoperative nausea and vomiting)   . Fluid retention   . Hypertension     Teena Irani, PTA/CLT [phone]  12/15/2014, 9:27 AM  [facility] [address] [place], [place], [address] Phone: [phone]   Fax:  [phone]  Name: Amy Tran MRN: [id number] Date of Birth: April 17, 1955

## 2014-12-20 ENCOUNTER — Ambulatory Visit (HOSPITAL_COMMUNITY): Payer: Medicare Other

## 2014-12-20 DIAGNOSIS — M25659 Stiffness of unspecified hip, not elsewhere classified: Secondary | ICD-10-CM

## 2014-12-20 DIAGNOSIS — M94261 Chondromalacia, right knee: Secondary | ICD-10-CM | POA: Diagnosis not present

## 2014-12-20 DIAGNOSIS — M6281 Muscle weakness (generalized): Secondary | ICD-10-CM | POA: Diagnosis not present

## 2014-12-20 DIAGNOSIS — M25562 Pain in left knee: Secondary | ICD-10-CM | POA: Diagnosis not present

## 2014-12-20 DIAGNOSIS — M25561 Pain in right knee: Secondary | ICD-10-CM | POA: Diagnosis not present

## 2014-12-20 DIAGNOSIS — R262 Difficulty in walking, not elsewhere classified: Secondary | ICD-10-CM

## 2014-12-20 DIAGNOSIS — M21869 Other specified acquired deformities of unspecified lower leg: Secondary | ICD-10-CM | POA: Diagnosis not present

## 2014-12-20 DIAGNOSIS — Z789 Other specified health status: Secondary | ICD-10-CM

## 2014-12-20 DIAGNOSIS — Z658 Other specified problems related to psychosocial circumstances: Secondary | ICD-10-CM | POA: Diagnosis not present

## 2014-12-20 DIAGNOSIS — R6889 Other general symptoms and signs: Secondary | ICD-10-CM

## 2014-12-20 DIAGNOSIS — I890 Lymphedema, not elsewhere classified: Secondary | ICD-10-CM | POA: Diagnosis not present

## 2014-12-20 NOTE — Therapy (Signed)
[place], [place], [address] Phone: [phone]   Fax:  [phone]  Physical Therapy Treatment  Patient Details  Name: Amy Tran MRN: [id number] Date of Birth: 07-27-1955 Referring Provider: Noemi Chapel  Encounter Date: 12/20/2014      PT End of Session - 12/20/14 1353    Visit Number 6   Number of Visits 14   Date for PT Re-Evaluation 12/22/14   Authorization Type Medicare and Medicaid    Authorization Time Period 11/24/14 to 01/24/15   Authorization - Visit Number 6   Authorization - Number of Visits 10   PT Start Time 1572   PT Stop Time 1436   PT Time Calculation (min) 49 min   Activity Tolerance Patient tolerated treatment well   Behavior During Therapy [facility] for tasks assessed/performed      Past Medical History  Diagnosis Date  . Hypertension   . Fluid retention   . Diabetes mellitus without complication   . PONV (postoperative nausea and vomiting)   . Wears glasses   . Wears partial dentures     bottom  . Incomplete rotator cuff tear   . Cancer     multiple skin cancers    Past Surgical History  Procedure Laterality Date  . Gastric bypass  2004  . Carpal tunnel release  2001    left  . Cholecystectomy    . Neck surgery  1998  . Shoulder surgery  1999    left  . Ercp  2002  . Colonoscopy  2007    IOM:BTDHRCB internal hemorrhoids. Diminutive rectal polyp at 10 cm, cold biopsied/removed. The remainder of the rectal mucosa appeared normal Swallow left-sided diverticula. Diminutive polyp at the splenic flexure cold biopsied/removed (adenomatous)  . Appendectomy    . Shoulder arthroscopy with subacromial decompression Left 06/23/2013    Procedure: LEFT SHOULDER ARTHROSCOPY WITH DEBRIDEMENT ROTATOR CUFF AND LABRUM;  Surgeon: Lorn Junes, MD;  Location: [place];  Service: Orthopedics;  Laterality: Left;  . Colonoscopy N/A 12/28/2013    Procedure: COLONOSCOPY;   Surgeon: Daneil Dolin, MD;  [facility];  Service: Endoscopy;  Laterality: N/A;  730 - moved to 8:30 - Ginger notified pt    There were no vitals filed for this visit.  Visit Diagnosis:  Chondromalacia of knee, right  Right knee pain  Genu recurvatum, unspecified laterality  Difficulty walking  Difficulty navigating stairs  Muscle weakness  Hip stiffness, unspecified laterality      Subjective Assessment - 12/20/14 1348    Subjective Pt stated Rt knee is constant achey pain scale 6/10   Patient Stated Goals reduce pain    Currently in Pain? Yes   Pain Score 6    Pain Location Knee   Pain Orientation Right   Pain Descriptors / Indicators Aching;Constant             [facility] Adult PT Treatment/Exercise - 12/20/14 0001    Exercises   Exercises Knee/Hip   Knee/Hip Exercises: Stretches   Active Hamstring Stretch 3 reps;30 seconds   Active Hamstring Stretch Limitations 12in step   Gastroc Stretch 3 reps;30 seconds   Gastroc Stretch Limitations slant board   Knee/Hip Exercises: Aerobic   [place] L2, resistance 2 x 8 min   Knee/Hip Exercises: Standing   Heel Raises Both;15 reps   Forward Lunges Both;15 reps   Forward Lunges Limitations 4" step   Side Lunges Both;15 reps  Side Lunges Limitations 4" step   Lateral Step Up Both;15 reps;Hand Hold: 2;Step Height: 4"   Forward Step Up Both;15 reps;Hand Hold: 1;Step Height: 6"   Step Down Both;10 reps;Step Height: 4";Hand Hold: 1   Functional Squat 10 reps   SLS with Vectors 5x " BLE on airex   Other Standing Knee Exercises sidestepping with RTB x 2RT           PT Short Term Goals - 11/24/14 1656    PT SHORT TERM GOAL #1   Title Patient will experience no more than 3/10 pain R knee with all functional standing tasks and activities of unlimited duration    Time 3   Period Weeks   Status New   PT SHORT TERM GOAL #2   Title Patient to demonstrate WNL hip IR motion to improve overall mechanics and  reduce stress on knees during mobility    Time 3   Period Weeks   Status New   PT SHORT TERM GOAL #3   Title Patient to be aware of posture and static mechanics at all times to avoid increased stress on joints through genu recurvatum of knees    Time 3   Period Weeks   Status New   PT SHORT TERM GOAL #4   Title Patient to be independent in correctly and consistently performing appropriate HEP, to be updated PRN    Time 3   Period Weeks   Status New           PT Long Term Goals - 11/24/14 1658    PT LONG TERM GOAL #1   Title Patient to be able to ambulate and perform functional standing tasks for unlimited periods of time with R knee pain no more than 2/10   Time 7   Period Weeks   Status New   PT LONG TERM GOAL #2   Title Patient to demonstrate 5/5 muscle strength bilateral lower extremities as well as at least 4/5 strength in proximal musculature    Time 7   Period Weeks   Status New   PT LONG TERM GOAL #3   Title Patient to be able to ascend and descend full flight of stairs reciprocally with no raling and pain in R knee no more than 2/10, no circumduction or unsteadiness    Time 7   Period Weeks   Status New   PT LONG TERM GOAL #4   Title Patient to report that she has been performing at least 30 minutes of light-moderate intensity physical activity at least 3 times per week in order to maintain functional gains, promote improved health habits, and reduce chances of recurrence of knee pain    Time 7   Period Weeks   Status New               Plan - 12/20/14 1419    Clinical Impression Statement Progressed functional strengthening with minimal verbal and tactile cueing for form and control through session.  Added airex with vector stance to improve overall work stability and cueing to slow down step dows for eccentric control.  No reports of pain through session.   PT Next Visit Plan Continue with functional strengthening in standing.        Problem  List Patient Active Problem List   Diagnosis Date Noted  . Diverticulosis of colon without hemorrhage   . Hx of adenomatous colonic polyps 12/01/2013  . Pain in joint, shoulder region 06/24/2013  . Muscle weakness (generalized)  06/24/2013  . Incomplete rotator cuff tear   . Diabetes mellitus without complication ([place])   . PONV (postoperative nausea and vomiting)   . Fluid retention   . Hypertension    Ihor Austin, [place]; [place]  Aldona Lento 12/20/2014, 2:26 PM  [place], [place], [address] Phone: [phone]   Fax:  [phone]  Name: Amy Tran MRN: [id number] Date of Birth: 05/15/1955

## 2014-12-22 DIAGNOSIS — Z6841 Body Mass Index (BMI) 40.0 and over, adult: Secondary | ICD-10-CM | POA: Diagnosis not present

## 2014-12-22 DIAGNOSIS — Z8711 Personal history of peptic ulcer disease: Secondary | ICD-10-CM | POA: Diagnosis not present

## 2014-12-23 ENCOUNTER — Ambulatory Visit (HOSPITAL_COMMUNITY): Payer: Medicare Other

## 2014-12-23 DIAGNOSIS — M21869 Other specified acquired deformities of unspecified lower leg: Secondary | ICD-10-CM | POA: Diagnosis not present

## 2014-12-23 DIAGNOSIS — M25561 Pain in right knee: Secondary | ICD-10-CM

## 2014-12-23 DIAGNOSIS — Z658 Other specified problems related to psychosocial circumstances: Secondary | ICD-10-CM | POA: Diagnosis not present

## 2014-12-23 DIAGNOSIS — R262 Difficulty in walking, not elsewhere classified: Secondary | ICD-10-CM

## 2014-12-23 DIAGNOSIS — M25659 Stiffness of unspecified hip, not elsewhere classified: Secondary | ICD-10-CM

## 2014-12-23 DIAGNOSIS — I890 Lymphedema, not elsewhere classified: Secondary | ICD-10-CM | POA: Diagnosis not present

## 2014-12-23 DIAGNOSIS — M94261 Chondromalacia, right knee: Secondary | ICD-10-CM | POA: Diagnosis not present

## 2014-12-23 DIAGNOSIS — R6889 Other general symptoms and signs: Secondary | ICD-10-CM

## 2014-12-23 DIAGNOSIS — M25562 Pain in left knee: Secondary | ICD-10-CM | POA: Diagnosis not present

## 2014-12-23 DIAGNOSIS — Z789 Other specified health status: Secondary | ICD-10-CM

## 2014-12-23 DIAGNOSIS — M6281 Muscle weakness (generalized): Secondary | ICD-10-CM

## 2014-12-23 NOTE — Therapy (Signed)
[place], [place], [address] Phone: [phone]   Fax:  [phone]  Physical Therapy Treatment  Patient Details  Name: Amy Tran MRN: [id number] Date of Birth: 1955-04-27 Referring Provider: Noemi Chapel   Encounter Date: 12/23/2014      PT End of Session - 12/23/14 1225    Visit Number 7   Number of Visits 14   Date for PT Re-Evaluation 12/22/14   Authorization Type Medicare and Medicaid    Authorization Time Period 11/24/14 to 01/24/15   Authorization - Visit Number 7   Authorization - Number of Visits 17   PT Start Time 9774   PT Stop Time 1019   PT Time Calculation (min) 38 min   Activity Tolerance Patient tolerated treatment well;Patient limited by pain;No increased pain   Behavior During Therapy [facility] for tasks assessed/performed      Past Medical History  Diagnosis Date  . Hypertension   . Fluid retention   . Diabetes mellitus without complication   . PONV (postoperative nausea and vomiting)   . Wears glasses   . Wears partial dentures     bottom  . Incomplete rotator cuff tear   . Cancer     multiple skin cancers    Past Surgical History  Procedure Laterality Date  . Gastric bypass  2004  . Carpal tunnel release  2001    left  . Cholecystectomy    . Neck surgery  1998  . Shoulder surgery  1999    left  . Ercp  2002  . Colonoscopy  2007    FSE:LTRVUYE internal hemorrhoids. Diminutive rectal polyp at 10 cm, cold biopsied/removed. The remainder of the rectal mucosa appeared normal Swallow left-sided diverticula. Diminutive polyp at the splenic flexure cold biopsied/removed (adenomatous)  . Appendectomy    . Shoulder arthroscopy with subacromial decompression Left 06/23/2013    Procedure: LEFT SHOULDER ARTHROSCOPY WITH DEBRIDEMENT ROTATOR CUFF AND LABRUM;  Surgeon: Lorn Junes, MD;  Location: [place];  Service: Orthopedics;  Laterality: Left;  . Colonoscopy N/A  12/28/2013    Procedure: COLONOSCOPY;  Surgeon: Daneil Dolin, MD;  [facility];  Service: Endoscopy;  Laterality: N/A;  730 - moved to 8:30 - Ginger notified pt    There were no vitals filed for this visit.  Visit Diagnosis:  Chondromalacia of knee, right  Right knee pain  Genu recurvatum, unspecified laterality  Difficulty walking  Difficulty navigating stairs  Muscle weakness  Hip stiffness, unspecified laterality  Muscle weakness (generalized)      Subjective Assessment - 12/23/14 0944    Subjective Pt reports to be in about 4-5 of 10 pain consistently. She feels as though she has made little to no progress in walking, pain, function, etc. Pt reports she notices only a very mild imporvement in stairs and getting out of a chair.    Pertinent History Knee started hurting about 4 weeks ago insidiously; patient reports that she works on [employer] and has to be on her feet and standing a lot. Has been on medications for her knee but has not received any other treatments.   How long can you sit comfortably? no limits    How long can you stand comfortably? painful but does not have to stop standing becasue of it; no change, patient does not time.     How long can you walk comfortably? around 15 minutes before knee feels like it is going  to give out ; no change    Patient Stated Goals reduce pain; not functionally limited at this time, due to perseverence to continue to stay active.    Currently in Pain? Yes   Pain Score 4    Pain Location Knee   Pain Orientation Right   Pain Descriptors / Indicators Aching;Constant   Pain Type Chronic pain   Aggravating Factors  Hurts more when it is cold and leg gets cold.    Pain Relieving Factors has not tried ice; heat nor meds are not helpful.    Effect of Pain on Daily Activities Does not self limit.             [facility] PT Assessment - 12/23/14 0001    Assessment   Medical Diagnosis R knee grade IV patella femoral  chondromalacia    Referring Provider [place]    Onset Date/Surgical Date 10/27/14   Next MD Visit patient to finish course of PT then call MD Noemi Chapel for follow-up    Precautions   Precautions None   Restrictions   Weight Bearing Restrictions No   Balance Screen   Has the patient fallen in the past 6 months No   Has the patient had a decrease in activity level because of a fear of falling?  No  slowing down, but doign everything.    Is the patient reluctant to leave their home because of a fear of falling?  No   Prior Function   Level of Independence Independent with basic ADLs;Independent;Independent with gait;Independent with transfers   [place] time employment   Leisure works at [employer]; has to do 6 hour shifts, needs to be up on her feet a lot but does get a chance to sit down    Other:   Other/ Comments TUG:  9.46   AROM   Right Hip Extension 13   Right Hip External Rotation  30   Right Hip Internal Rotation  35   Left Hip Extension 10   Left Hip External Rotation  45   Left Hip Internal Rotation  25   Right Knee Extension -4   Right Knee Flexion 116  limited by habitus   Left Knee Extension -9   Left Knee Flexion 128  limited by habitus   Strength   Right Hip Flexion 5/5   Right Hip Extension 5/5   Right Hip ABduction 4+/5   Left Hip Flexion 5/5   Left Hip Extension 5/5   Left Hip ABduction 4+/5   Right Knee Flexion 5/5   Right Knee Extension 5/5   Left Knee Flexion 5/5   Left Knee Extension 5/5   Right Ankle Dorsiflexion 5/5   Left Ankle Dorsiflexion 5/5   Transfers   Five time sit to stand comments  13.9                             PT Education - 12/23/14 1223    Education provided Yes   Education Details Explained in detail how her lower-crossed syndrome is limiting progress in knee postureing, and how the back needs to be addressed.    Person(s) Educated Patient   Methods Explanation;Demonstration   Comprehension Verbalized  understanding;Need further instruction          PT Short Term Goals - 12/23/14 1234    PT SHORT TERM GOAL #1   Title Patient will experience no more than 3/10 pain R knee with  all functional standing tasks and activities of unlimited duration    Time 3   Period Weeks   Status Not Met   PT SHORT TERM GOAL #2   Title Patient to demonstrate WNL hip IR motion to improve overall mechanics and reduce stress on knees during mobility    Time 3   Period Weeks   Status Not Met   PT SHORT TERM GOAL #3   Title Patient to be aware of posture and static mechanics at all times to avoid increased stress on joints through genu recurvatum of knees    Time 3   Period Weeks   Status Not Met   PT SHORT TERM GOAL #4   Title Patient to be independent in correctly and consistently performing appropriate HEP, to be updated PRN    Time 3   Period Weeks   Status On-going           PT Long Term Goals - 12/23/14 1235    PT LONG TERM GOAL #1   Title At 8 weeks patient will demonstrate improved functional mobility as evident in 5x sit to stand in less than 11 seconds to prepare for greater indep in mobility at home.    Time 7   Period Weeks   Status New   PT LONG TERM GOAL #2   Title Patient to demonstrate 5/5 muscle strength bilateral lower extremities as well as at least 4/5 strength in proximal musculature    Time 7   Period Weeks   Status Achieved   PT LONG TERM GOAL #3   Title Patient to be able to ascend and descend full flight of stairs reciprocally with no raling and pain in R knee no more than 2/10, no circumduction or unsteadiness    Time 8   Period Weeks   Status On-going   PT LONG TERM GOAL #4   Title Patient to report that she has been performing at least 30 minutes of light-moderate intensity physical activity at least 3 times per week in order to maintain functional gains, promote improved health habits, and reduce chances of recurrence of knee pain    Time 8   Period Weeks    Status On-going               Plan - 12/23/14 1226    Clinical Impression Statement Pt is showing progress toward goals as evidenced by improved strength and moderately improve ROM in BLE. Pt maintains rigidity in trunk and lumbar spine, which is keeping her at endrange anterior pelvic tilt, unable to correct bilateral genu recuervatum. Despite improved strength and range, no improvement can be shown in terms of pain management and functional mobility. Will contiue to work toward goals moving forward with greater emphasis on addressing the total kinetic chain including core streeengthening, the pelvis, lumbar, and thoracic spines.    Pt will benefit from skilled therapeutic intervention in order to improve on the following deficits Abnormal gait;Hypomobility;Obesity;Decreased strength;Pain;Difficulty walking;Improper body mechanics;Decreased coordination;Impaired flexibility;Postural dysfunction   Rehab Potential Good   Clinical Impairments Affecting Rehab Potential chronicity of low back rigidity.    PT Frequency 2x / week   PT Duration 8 weeks   PT Treatment/Interventions ADLs/Self Care Home Management;Cryotherapy;Gait training;Stair training;Functional mobility training;Therapeutic activities;Therapeutic exercise;Balance training;Neuromuscular re-education;Patient/family education;Manual techniques;Taping   PT Next Visit Plan Review seated lumbar traction stretch, progress to supine DKTC; add in SKTC stretching, perscapular strengthening, and TrA activation and core stabilization. Consider taping the posterior knees for additional proprioceptive cues  during recurvatum.    PT Home Exercise Plan Given supine lumbar traction Astronaut stretch to gently exit rigid lumbar lordosis.   Consulted and Agree with Plan of Care Patient        Problem List Patient Active Problem List   Diagnosis Date Noted  . Diverticulosis of colon without hemorrhage   . Hx of adenomatous colonic polyps  12/01/2013  . Pain in joint, shoulder region 06/24/2013  . Muscle weakness (generalized) 06/24/2013  . Incomplete rotator cuff tear   . Diabetes mellitus without complication ([place])   . PONV (postoperative nausea and vomiting)   . Fluid retention   . Hypertension    Physical Therapy Progress Note  Dates of Reporting Period: 11/24/14  to 12/23/14  Objective Reports of Subjective Statement: *see above  Objective Measurements: *see above  Goal Update: *see above  Plan: *see above  Reason Skilled Services are Required: Limited progress toward goals; progress still being made.     Buccola,Allan C 12/23/2014, 12:41 PM  12:43 PM  Etta Grandchild, PT, DPT [place] License # [id number]       [place] [facility] [address] [place], [place], [address] Phone: [phone]   Fax:  [phone]  Name: TYMIRA HORKEY MRN: [id number] Date of Birth: [birth year]

## 2014-12-23 NOTE — Patient Instructions (Signed)
   Lie on back with feet up in a chair of on couch until a stretch is felt in the lower back.  Maintain 3-5 minutes 1x each day.Gradually progress to twice each day.

## 2014-12-26 MED ORDER — PROPOFOL 10 MG/ML IV BOLUS
INTRAVENOUS | Status: AC
  Filled 2014-12-26: qty 20

## 2014-12-27 ENCOUNTER — Ambulatory Visit (HOSPITAL_COMMUNITY): Payer: Medicare Other | Admitting: Physical Therapy

## 2014-12-27 DIAGNOSIS — M21869 Other specified acquired deformities of unspecified lower leg: Secondary | ICD-10-CM | POA: Diagnosis not present

## 2014-12-27 DIAGNOSIS — R262 Difficulty in walking, not elsewhere classified: Secondary | ICD-10-CM

## 2014-12-27 DIAGNOSIS — M25561 Pain in right knee: Secondary | ICD-10-CM | POA: Diagnosis not present

## 2014-12-27 DIAGNOSIS — I890 Lymphedema, not elsewhere classified: Secondary | ICD-10-CM | POA: Diagnosis not present

## 2014-12-27 DIAGNOSIS — M25562 Pain in left knee: Secondary | ICD-10-CM | POA: Diagnosis not present

## 2014-12-27 DIAGNOSIS — M94261 Chondromalacia, right knee: Secondary | ICD-10-CM

## 2014-12-27 DIAGNOSIS — R6889 Other general symptoms and signs: Secondary | ICD-10-CM

## 2014-12-27 DIAGNOSIS — M6281 Muscle weakness (generalized): Secondary | ICD-10-CM

## 2014-12-27 DIAGNOSIS — M25659 Stiffness of unspecified hip, not elsewhere classified: Secondary | ICD-10-CM | POA: Diagnosis not present

## 2014-12-27 DIAGNOSIS — Z789 Other specified health status: Secondary | ICD-10-CM

## 2014-12-27 DIAGNOSIS — Z658 Other specified problems related to psychosocial circumstances: Secondary | ICD-10-CM | POA: Diagnosis not present

## 2014-12-27 NOTE — Therapy (Addendum)
[place], [place], [address] Phone: [phone]   Fax:  [phone]  Physical Therapy Evaluation  Patient Details  Name: Amy Tran MRN: [id number] Date of Birth: May 11, 1955 Referring Provider: Noemi Chapel  Encounter Date: 12/27/2014      PT End of Session - 12/27/14 1638    Visit Number 1 for lymphedema 9 for knee    Number of Visits 25  visit 14 will be last for knee unless pt opts to stop before.    Date for PT Re-Evaluation 01/26/15  12/10 for knee   Authorization Type Medicare and Medicaid    Authorization - Visit Number 9   Authorization - Number of Visits 25  14 for  knee   PT Start Time 1435   PT Stop Time 1519   PT Time Calculation (min) 44 min      Past Medical History  Diagnosis Date  . Hypertension   . Fluid retention   . Diabetes mellitus without complication   . PONV (postoperative nausea and vomiting)   . Wears glasses   . Wears partial dentures     bottom  . Incomplete rotator cuff tear   . Cancer     multiple skin cancers    Past Surgical History  Procedure Laterality Date  . Gastric bypass  2004  . Carpal tunnel release  2001    left  . Cholecystectomy    . Neck surgery  1998  . Shoulder surgery  1999    left  . Ercp  2002  . Colonoscopy  2007    [id number] internal hemorrhoids. Diminutive rectal polyp at 10 cm, cold biopsied/removed. The remainder of the rectal mucosa appeared normal Swallow left-sided diverticula. Diminutive polyp at the splenic flexure cold biopsied/removed (adenomatous)  . Appendectomy    . Shoulder arthroscopy with subacromial decompression Left 06/23/2013    Procedure: LEFT SHOULDER ARTHROSCOPY WITH DEBRIDEMENT ROTATOR CUFF AND LABRUM;  Surgeon: Lorn Junes, MD;  Location: [facility];  Service: Orthopedics;  Laterality: Left;  . Colonoscopy N/A 12/28/2013    Procedure: COLONOSCOPY;  Surgeon: Daneil Dolin, MD;  [facility];  Service: Endoscopy;  Laterality: N/A;  730 - moved to 8:30 - Amy Tran notified pt    There were no vitals filed for this visit.  Visit Diagnosis:  Arthralgia of left lower leg  Lymphedema      Subjective Assessment - 12/27/14 1615    Subjective Amy Tran states that she has had varicosities in her LE  for years.  She stated in the past 10-15 years she has noticed that her legs swell up, (left LE more so than her right).  She states that thes is a significant amount ot pain associated with the swelling.  She has gone to her medical MD and has been given diuretics with no improvement.  She is now being referred to physical therapy for lympedema.     Pertinent History Pt is currently in physical therapy for Rt knee pain.    Currently in Pain? Yes   Pain Score 4   will go as high as an 8    Pain Location Leg   Pain Orientation Left   Pain Descriptors / Indicators Aching;Throbbing;Tightness   Pain Type Chronic pain            [facility] PT Assessment - 12/27/14 1621    Assessment   Medical Diagnosis lymphedema   Referring Provider Noemi Chapel  Onset Date/Surgical Date --  chronic   Next MD Visit patient to finish course of PT then call MD Noemi Chapel for follow-up    Precautions   Precautions None   Restrictions   Weight Bearing Restrictions No   Balance Screen   Has the patient fallen in the past 6 months No   Has the patient had a decrease in activity level because of a fear of falling?  No   Is the patient reluctant to leave their home because of a fear of falling?  No   Prior Function   Level of Independence Independent   Vocation Part time employment   Leisure works at [employer]; has to do 6 hour shifts, needs to be up on her feet a lot but does get a chance to sit down    Observation/Other Assessments   Other Surveys  --  LIfe impact score 38   Observation/Other Assessments-Edema    Edema Circumferential       Date 12/27/2014 12/27/2014   Rt LT   MTP 23.40 27.8   ankle 25.80 30.30  4cm 28.20 34.80  8cm 31.50 38.20  12 cm 34.70 44.40  16cm 41.30 46.80  20cm 44.50 48.00  24cm 46.60 48.60  28cm 46.00 46.70  32cm 44.30 46.50  36cm 47.00 58.30  40cm 55.00 61.90  44cm 60.10 68.30  48cm 63.70 72.50                      Sum of squares 27044.47 34683.55  Total Volume [id number]                    PT Education - 12/27/14 1635    Education provided Yes   Education Details Pt was unaware of  Lymphedema.  Pt was educated on lymphedema and the chronic nature of lymphedema.  She was explained the process of decongestive manual techniques followed by short stretch bandages to decrease volume followed by wearing compression garments.  She was given order form for short stretch bandages,LE exercises sheet, diaphargmic breathing instructions, and precautions to cellulitis.    Person(s) Educated Patient   Methods Explanation;Handout   Comprehension Verbalized understanding          PT Short Term Goals - 12/27/14 1645    PT SHORT TERM GOAL #1   Title Patient will experience no more than 3/10 pain R knee with all functional standing tasks and activities of unlimited duration    Time 3   Period Weeks   Status On-going   PT SHORT TERM GOAL #2   Title Patient to demonstrate WNL hip IR motion to improve overall mechanics and reduce stress on knees during mobility    Time 3   Period Weeks   Status On-going   PT SHORT TERM GOAL #3   Title Patient to be aware of posture and static mechanics at all times to avoid increased stress on joints through genu recurvatum of knees    Time 3   Period Weeks   Status On-going   PT SHORT TERM GOAL #4   Time 3   Period Weeks   Status On-going   PT SHORT TERM GOAL #5   Title Pt will have a 25% reduction for improved comfort and clothes fitting    Baseline 12/28/2014- 11040 cc   Time 2   Period Weeks   Additional Short Term Goals   Additional Short Term Goals Yes   PT SHORT TERM GOAL #6  Title Pt to be able to identify the signs and sx of celllulitis due to prolong lymphstasis    Baseline 12/28/2014- pt  unaware of risks    Time 2   Period Weeks   PT SHORT TERM GOAL #7   Title Pt will be I in remedial exercises and diaphragmic breatining to promote lymph flow   Baseline 12/28/14 - unaware of exercises to assist in lymphatic system    Time 2   Period Weeks           PT Long Term Goals - 12/27/14 1649    PT LONG TERM GOAL #1   Title At 8 weeks patient will demonstrate improved functional mobility as evident in 5x sit to stand in less than 11 seconds to prepare for greater indep in mobility at home.    Time 7   Period Weeks   Status New   PT LONG TERM GOAL #2   Title Patient to demonstrate 5/5 muscle strength bilateral lower extremities as well as at least 4/5 strength in proximal musculature    Time 7   Period Weeks   Status Achieved   PT LONG TERM GOAL #3   Title Patient to be able to ascend and descend full flight of stairs reciprocally with no raling and pain in R knee no more than 2/10, no circumduction or unsteadiness    Time 8   Period Weeks   Status On-going   PT LONG TERM GOAL #4   Title Patient to report that she has been performing at least 30 minutes of light-moderate intensity physical activity at least 3 times per week in order to maintain functional gains, promote improved health habits, and reduce chances of recurrence of knee pain    Time 8   Period Weeks   Status On-going   PT LONG TERM GOAL #5   Title Pt to have a 50% reduction for improved functional mobility and decreased discomfort   Time 4   Status New   Additional Long Term Goals   Additional Long Term Goals Yes   PT LONG TERM GOAL #6   Title Pt to be I with donning and doffing of vcompression garments   Time 4   Period Weeks   Status New   PT LONG TERM GOAL #7   Title Pt to be I with home management of LE edema   Time 4   Period Weeks   Status New                Plan - 12/27/14 1640    Clinical Impression Statement Pt assessed for Lymphedema.  Pt has 8608 CC volume in Rt LE with 11040 cc in Lt LT with increased induration noted in Lt LE.  Pt was educated on the process of decongestion and the commitment that the patient would have.  Pt given all information and will give [place] her decision on whether to continue with decongestive techniques or not.     Pt will benefit from skilled therapeutic intervention in order to improve on the following deficits Pain;Decreased activity tolerance;Difficulty walking;Increased edema   Rehab Potential Good   PT Frequency 3x / week   PT Duration 4 weeks   PT Treatment/Interventions Patient/family education;Manual techniques;Manual lymph drainage;Compression bandaging   PT Next Visit Plan Begin manul lymph drainage if pt agrees to lymphedema treatment.  Cut foam for LE and begin compression bandaging.  Pt will continue to be seen for her knee as well  G Codes:  [id number]  CK [id number]  CJ    Problem List Patient Active Problem List   Diagnosis Date Noted  . Diverticulosis of colon without hemorrhage   . Hx of adenomatous colonic polyps 12/01/2013  . Pain in joint, shoulder region 06/24/2013  . Muscle weakness (generalized) 06/24/2013  . Incomplete rotator cuff tear   . Diabetes mellitus without complication ([place])   . PONV (postoperative nausea and vomiting)   . Fluid retention   . Hypertension     Rayetta Humphrey, [place] CLT [phone] 12/27/2014, 4:54 PM  [facility] [address] [place], [place], [address] Phone: [phone]   Fax:  [phone]  Name: Amy Tran MRN: [id number] Date of Birth: December 30, 1955

## 2014-12-27 NOTE — Therapy (Signed)
[place], [place], [address] Phone: [phone]   Fax:  [phone]  Physical Therapy Treatment  Patient Details  Name: Amy Tran MRN: [id number] Date of Birth: September 26, 1955 Referring Provider: Noemi Chapel   Encounter Date: 12/27/2014      PT End of Session - 12/27/14 1436    Visit Number 8   Number of Visits 14   Date for PT Re-Evaluation 12/22/14   Authorization Type Medicare and Medicaid    Authorization Time Period 11/24/14 to 01/24/15   Authorization - Visit Number 8   Authorization - Number of Visits 17   PT Start Time 6962   PT Stop Time 1430   PT Time Calculation (min) 38 min   Activity Tolerance Patient tolerated treatment well;Patient limited by pain;No increased pain   Behavior During Therapy [facility] for tasks assessed/performed      Past Medical History  Diagnosis Date  . Hypertension   . Fluid retention   . Diabetes mellitus without complication   . PONV (postoperative nausea and vomiting)   . Wears glasses   . Wears partial dentures     bottom  . Incomplete rotator cuff tear   . Cancer     multiple skin cancers    Past Surgical History  Procedure Laterality Date  . Gastric bypass  2004  . Carpal tunnel release  2001    left  . Cholecystectomy    . Neck surgery  1998  . Shoulder surgery  1999    left  . Ercp  2002  . Colonoscopy  2007    [id number] internal hemorrhoids. Diminutive rectal polyp at 10 cm, cold biopsied/removed. The remainder of the rectal mucosa appeared normal Swallow left-sided diverticula. Diminutive polyp at the splenic flexure cold biopsied/removed (adenomatous)  . Appendectomy    . Shoulder arthroscopy with subacromial decompression Left 06/23/2013    Procedure: LEFT SHOULDER ARTHROSCOPY WITH DEBRIDEMENT ROTATOR CUFF AND LABRUM;  Surgeon: Lorn Junes, MD;  Location: [facility];  Service: Orthopedics;  Laterality: Left;  . Colonoscopy N/A  12/28/2013    Procedure: COLONOSCOPY;  Surgeon: Daneil Dolin, MD;  [facility];  Service: Endoscopy;  Laterality: N/A;  730 - moved to 8:30 - Ginger notified pt    There were no vitals filed for this visit.  Visit Diagnosis:  Chondromalacia of knee, right  Right knee pain  Difficulty walking  Difficulty navigating stairs  Genu recurvatum, unspecified laterality  Muscle weakness  Hip stiffness, unspecified laterality  Muscle weakness (generalized)      Subjective Assessment - 12/27/14 1441    Subjective PT states last session nearly killed her.  STates she had alot of soreness and pain in her knee and lumbar region and prefers not to get back into the floor for the stretches she was doing.  Currently with 8/10 pain in her Rt knee.    Currently in Pain? Yes   Pain Score 8    Pain Location Knee   Pain Orientation Right   Pain Descriptors / Indicators Aching;Cramping                         [facility] Adult PT Treatment/Exercise - 12/27/14 1353    Knee/Hip Exercises: Stretches   Active Hamstring Stretch 3 reps;30 seconds   Active Hamstring Stretch Limitations 12in step   Gastroc Stretch 3 reps;30 seconds   Gastroc Stretch Limitations slant board   Knee/Hip  Exercises: Aerobic   [place] L2, resistance 2 x 8 min   Knee/Hip Exercises: Standing   Forward Lunges Both;15 reps   Forward Lunges Limitations 4" step   Hip Abduction 15 reps;Both   Hip Extension Both;15 reps   Lateral Step Up Both;15 reps;Hand Hold: 2;Step Height: 4"   Lateral Step Up Limitations 4"   Forward Step Up Both;15 reps;Hand Hold: 1;Step Height: 6"   Forward Step Up Limitations 6"   Step Down Both;10 reps;Step Height: 4";Hand Hold: 1   Step Down Limitations 4"   Other Standing Knee Exercises hip excursions 10reps each   Knee/Hip Exercises: Supine   Other Supine Knee/Hip Exercises SKTC 10X10"                  PT Short Term Goals - 12/23/14 1234    PT SHORT  TERM GOAL #1   Title Patient will experience no more than 3/10 pain R knee with all functional standing tasks and activities of unlimited duration    Time 3   Period Weeks   Status Not Met   PT SHORT TERM GOAL #2   Title Patient to demonstrate WNL hip IR motion to improve overall mechanics and reduce stress on knees during mobility    Time 3   Period Weeks   Status Not Met   PT SHORT TERM GOAL #3   Title Patient to be aware of posture and static mechanics at all times to avoid increased stress on joints through genu recurvatum of knees    Time 3   Period Weeks   Status Not Met   PT SHORT TERM GOAL #4   Title Patient to be independent in correctly and consistently performing appropriate HEP, to be updated PRN    Time 3   Period Weeks   Status On-going           PT Long Term Goals - 12/23/14 1235    PT LONG TERM GOAL #1   Title At 8 weeks patient will demonstrate improved functional mobility as evident in 5x sit to stand in less than 11 seconds to prepare for greater indep in mobility at home.    Time 7   Period Weeks   Status New   PT LONG TERM GOAL #2   Title Patient to demonstrate 5/5 muscle strength bilateral lower extremities as well as at least 4/5 strength in proximal musculature    Time 7   Period Weeks   Status Achieved   PT LONG TERM GOAL #3   Title Patient to be able to ascend and descend full flight of stairs reciprocally with no raling and pain in R knee no more than 2/10, no circumduction or unsteadiness    Time 8   Period Weeks   Status On-going   PT LONG TERM GOAL #4   Title Patient to report that she has been performing at least 30 minutes of light-moderate intensity physical activity at least 3 times per week in order to maintain functional gains, promote improved health habits, and reduce chances of recurrence of knee pain    Time 8   Period Weeks   Status On-going               Plan - 12/27/14 1436    Clinical Impression Statement traction  stretches held today due to increased lumbar discomfort and per patient request.  Continued LE strengthening exercises with addition of hip excursions to improve hip mobiliy and SKTC stretch to improve  hip mobility. PT without increased discomfort at end of session.  Pt required tactile cues with squats and lunges today.    Pt will benefit from skilled therapeutic intervention in order to improve on the following deficits Abnormal gait;Hypomobility;Obesity;Decreased strength;Pain;Difficulty walking;Improper body mechanics;Decreased coordination;Impaired flexibility;Postural dysfunction   Rehab Potential Good   Clinical Impairments Affecting Rehab Potential chronicity of low back rigidity.    PT Frequency 2x / week   PT Duration 8 weeks   PT Treatment/Interventions ADLs/Self Care Home Management;Cryotherapy;Gait training;Stair training;Functional mobility training;Therapeutic activities;Therapeutic exercise;Balance training;Neuromuscular re-education;Patient/family education;Manual techniques;Taping   PT Next Visit Plan Review seated lumbar traction stretch, progress to supine DKTC; add in SKTC stretching, perscapular strengthening, and TrA activation and core stabilization. Consider taping the posterior knees for additional proprioceptive cues during recurvatum.    PT Home Exercise Plan Consult with re-evaluating therapist regarding findings and new direction of therapy.  continue to progress towards goals.    Consulted and Agree with Plan of Care Patient        Problem List Patient Active Problem List   Diagnosis Date Noted  . Diverticulosis of colon without hemorrhage   . Hx of adenomatous colonic polyps 12/01/2013  . Pain in joint, shoulder region 06/24/2013  . Muscle weakness (generalized) 06/24/2013  . Incomplete rotator cuff tear   . Diabetes mellitus without complication ([place])   . PONV (postoperative nausea and vomiting)   . Fluid retention   . Hypertension     Teena Irani,  PTA/CLT [phone]  12/27/2014, 2:43 PM  [facility] [address] [place], [place], [address] Phone: [phone]   Fax:  [phone]  Name: MARGAREE SANDHU MRN: [id number] Date of Birth: 10-22-1955

## 2014-12-30 ENCOUNTER — Ambulatory Visit (HOSPITAL_COMMUNITY): Payer: Medicare Other

## 2015-01-03 ENCOUNTER — Ambulatory Visit (HOSPITAL_COMMUNITY): Payer: Medicare Other | Admitting: Physical Therapy

## 2015-01-03 DIAGNOSIS — M94261 Chondromalacia, right knee: Secondary | ICD-10-CM | POA: Diagnosis not present

## 2015-01-03 DIAGNOSIS — M6281 Muscle weakness (generalized): Secondary | ICD-10-CM | POA: Diagnosis not present

## 2015-01-03 DIAGNOSIS — R262 Difficulty in walking, not elsewhere classified: Secondary | ICD-10-CM | POA: Diagnosis not present

## 2015-01-03 DIAGNOSIS — Z658 Other specified problems related to psychosocial circumstances: Secondary | ICD-10-CM | POA: Diagnosis not present

## 2015-01-03 DIAGNOSIS — M21869 Other specified acquired deformities of unspecified lower leg: Secondary | ICD-10-CM | POA: Diagnosis not present

## 2015-01-03 DIAGNOSIS — M25561 Pain in right knee: Secondary | ICD-10-CM

## 2015-01-03 DIAGNOSIS — M25562 Pain in left knee: Secondary | ICD-10-CM | POA: Diagnosis not present

## 2015-01-03 DIAGNOSIS — I890 Lymphedema, not elsewhere classified: Secondary | ICD-10-CM | POA: Diagnosis not present

## 2015-01-03 DIAGNOSIS — M25659 Stiffness of unspecified hip, not elsewhere classified: Secondary | ICD-10-CM | POA: Diagnosis not present

## 2015-01-03 DIAGNOSIS — Z789 Other specified health status: Secondary | ICD-10-CM

## 2015-01-03 DIAGNOSIS — R6889 Other general symptoms and signs: Secondary | ICD-10-CM

## 2015-01-03 NOTE — Therapy (Signed)
[place], [place], [address] Phone: [phone]   Fax:  [phone]  Physical Therapy Treatment (G-code done)  Patient Details  Name: SIANNE YZQUIERDO MRN: [id number] Date of Birth: 1955/09/09 Referring Provider: Noemi Chapel  Encounter Date: 01/03/2015      PT End of Session - 01/03/15 1523    Visit Number 10  10 for knee, 1 for lymphedema    Number of Visits 25   Date for PT Re-Evaluation 01/26/15   Authorization Type Medicare and Medicaid    Authorization Time Period 11/24/14 to 01/24/15   Authorization - Visit Number 10   Authorization - Number of Visits 25   PT Start Time [id number]   PT Stop Time [id number]   PT Time Calculation (min) 39 min   Activity Tolerance Patient limited by pain;Patient tolerated treatment well   Behavior During Therapy [facility] for tasks assessed/performed      Past Medical History  Diagnosis Date  . Hypertension   . Fluid retention   . Diabetes mellitus without complication   . PONV (postoperative nausea and vomiting)   . Wears glasses   . Wears partial dentures     bottom  . Incomplete rotator cuff tear   . Cancer     multiple skin cancers    Past Surgical History  Procedure Laterality Date  . Gastric bypass  2004  . Carpal tunnel release  2001    left  . Cholecystectomy    . Neck surgery  1998  . Shoulder surgery  1999    left  . Ercp  2002  . Colonoscopy  2007    [id number] internal hemorrhoids. Diminutive rectal polyp at 10 cm, cold biopsied/removed. The remainder of the rectal mucosa appeared normal Swallow left-sided diverticula. Diminutive polyp at the splenic flexure cold biopsied/removed (adenomatous)  . Appendectomy    . Shoulder arthroscopy with subacromial decompression Left 06/23/2013    Procedure: LEFT SHOULDER ARTHROSCOPY WITH DEBRIDEMENT ROTATOR CUFF AND LABRUM;  Surgeon: Lorn Junes, MD;  Location: [place];  Service: Orthopedics;  Laterality:  Left;  . Colonoscopy N/A 12/28/2013    Procedure: COLONOSCOPY;  Surgeon: Daneil Dolin, MD;  [facility];  Service: Endoscopy;  Laterality: N/A;  730 - moved to 8:30 - Ginger notified pt    There were no vitals filed for this visit.  Visit Diagnosis:  Chondromalacia of knee, right  Right knee pain  Difficulty walking  Difficulty navigating stairs  Genu recurvatum, unspecified laterality  Muscle weakness      Subjective Assessment - 01/03/15 1349    Subjective Patient reports that she is having some pain today, still around 5/10   Pertinent History Pt is currently in physical therapy for Rt knee pain.    How long can you sit comfortably? 11/22- no limits    How long can you stand comfortably? 11/22- 30-60 minutes    How long can you walk comfortably? 11/22- 15 minutes before it feels like its going to give out    Currently in Pain? Yes   Pain Score 5    Pain Location Knee   Pain Orientation Right            [facility] PT Assessment - 01/03/15 0001    Observation/Other Assessments   Focus on Therapeutic Outcomes (FOTO)  56% limited                      [facility]  Adult PT Treatment/Exercise - 01/03/15 0001    Knee/Hip Exercises: Stretches   Active Hamstring Stretch 3 reps;30 seconds   Active Hamstring Stretch Limitations 12in step   Gastroc Stretch 3 reps;30 seconds   Gastroc Stretch Limitations slant board   Knee/Hip Exercises: Standing   Heel Raises Both;15 reps   Heel Raises Limitations toe and heel raises    Forward Lunges Both;1 set   Forward Lunges Limitations 2 inch step    Side Lunges Both;1 set;10 reps   Side Lunges Limitations 2 inch box    Hip Extension Both;15 reps   Rocker Board Limitations x20AP and lateral 1x20   Other Standing Knee Exercises 3D hip excursions 1x10   Other Standing Knee Exercises hip abudction walks 2x31ft; 2D hip excursions 2x61ft    Knee/Hip Exercises: Seated   Long Arc Quad 1 set;10 reps                 PT Education - 01/03/15 1522    Education provided Yes   Education Details requested that patient make double appoitment for lymph treatments; also encouraged patient to go back to see MD as knee pain is not changing and in some cases is feeling worse    Person(s) Educated Patient   Methods Explanation   Comprehension Verbalized understanding          PT Short Term Goals - 12/27/14 1645    PT SHORT TERM GOAL #1   Title Patient will experience no more than 3/10 pain R knee with all functional standing tasks and activities of unlimited duration    Time 3   Period Weeks   Status On-going   PT SHORT TERM GOAL #2   Title Patient to demonstrate WNL hip IR motion to improve overall mechanics and reduce stress on knees during mobility    Time 3   Period Weeks   Status On-going   PT SHORT TERM GOAL #3   Title Patient to be aware of posture and static mechanics at all times to avoid increased stress on joints through genu recurvatum of knees    Time 3   Period Weeks   Status On-going   PT SHORT TERM GOAL #4   Time 3   Period Weeks   Status On-going   PT SHORT TERM GOAL #5   Title Pt will have a 25% reduction for improved comfort and clothes fitting    Baseline 12/28/2014- 11040 cc   Time 2   Period Weeks   Additional Short Term Goals   Additional Short Term Goals Yes   PT SHORT TERM GOAL #6   Title Pt to be able to identify the signs and sx of celllulitis due to prolong lymphstasis    Baseline 12/28/2014- pt  unaware of risks    Time 2   Period Weeks   PT SHORT TERM GOAL #7   Title Pt will be I in remedial exercises and diaphragmic breatining to promote lymph flow   Baseline 12/28/14 - unaware of exercises to assist in lymphatic system    Time 2   Period Weeks           PT Long Term Goals - 12/27/14 1649    PT LONG TERM GOAL #1   Title At 8 weeks patient will demonstrate improved functional mobility as evident in 5x sit to stand in less than 11 seconds to prepare  for greater indep in mobility at home.    Time 7   Period Weeks  Status New   PT LONG TERM GOAL #2   Title Patient to demonstrate 5/5 muscle strength bilateral lower extremities as well as at least 4/5 strength in proximal musculature    Time 7   Period Weeks   Status Achieved   PT LONG TERM GOAL #3   Title Patient to be able to ascend and descend full flight of stairs reciprocally with no raling and pain in R knee no more than 2/10, no circumduction or unsteadiness    Time 8   Period Weeks   Status On-going   PT LONG TERM GOAL #4   Title Patient to report that she has been performing at least 30 minutes of light-moderate intensity physical activity at least 3 times per week in order to maintain functional gains, promote improved health habits, and reduce chances of recurrence of knee pain    Time 8   Period Weeks   Status On-going   PT LONG TERM GOAL #5   Title Pt to have a 50% reduction for improved functional mobility and decreased discomfort   Time 4   Status New   Additional Long Term Goals   Additional Long Term Goals Yes   PT LONG TERM GOAL #6   Title Pt to be I with donning and doffing of vcompression garments   Time 4   Period Weeks   Status New   PT LONG TERM GOAL #7   Title Pt to be I with home management of LE edema   Time 4   Period Weeks   Status New               Plan - Jan 18, 2015 1526    Clinical Impression Statement Patient arrived today, reporting that her knee is not feeling much better and that at her re-assessment she was told to lie on floor with her leg propped up against wall to help her back, however this has made her pain even worse and her back, knee, and hip are hurting more now. Performed functional exercises today within patient's pain tolerance however displayed obvious discomfort throughout session. Recommended that she try pool exercsie for knee and gave her waiver for [facility] today. Patient also reports taht she has not been able to get  lymph bandages yet and PT requested that she schedule double session for lymph care.    Pt will benefit from skilled therapeutic intervention in order to improve on the following deficits Pain;Decreased activity tolerance;Difficulty walking;Increased edema   Rehab Potential Good   Clinical Impairments Affecting Rehab Potential chronicity of low back rigidity.    PT Frequency 3x / week   PT Duration 4 weeks   PT Treatment/Interventions Patient/family education;Manual techniques;Manual lymph drainage;Compression bandaging   PT Next Visit Plan continue functional strength and muscle coordination; continue lymph treatment as well    PT Home Exercise Plan Consult with re-evaluating therapist regarding findings and new direction of therapy.  continue to progress towards goals.    Consulted and Agree with Plan of Care Patient          G-Codes - 01-18-15 1542    Functional Assessment Tool Used FOTO 56% limited    Functional Limitation Mobility: Walking and moving around   Mobility: Walking and Moving Around Current Status [phone]) At least 40 percent but less than 60 percent impaired, limited or restricted   Mobility: Walking and Moving Around Goal Status [id number]) At least 40 percent but less than 60 percent impaired, limited or restricted  Problem List Patient Active Problem List   Diagnosis Date Noted  . Diverticulosis of colon without hemorrhage   . Hx of adenomatous colonic polyps 12/01/2013  . Pain in joint, shoulder region 06/24/2013  . Muscle weakness (generalized) 06/24/2013  . Incomplete rotator cuff tear   . Diabetes mellitus without complication ([place])   . PONV (postoperative nausea and vomiting)   . Fluid retention   . Hypertension    Physical Therapy Progress Note  Dates of Reporting Period: 11/24/14 to 01/03/15  Objective Reports of Subjective Statement: see above   Objective Measurements: see progress note from 12/23/14  Goal Update: see above   Plan: see above    Reason Skilled Services are Required: knee pain, poor posture, develop appropriate advanced HEP   Deniece Ree PT, DPT [place] [address] [place], [place], [address] Phone: [phone]   Fax:  [phone]  Name: MALENE INGA MRN: [id number] Date of Birth: [birth year]

## 2015-01-06 ENCOUNTER — Encounter (HOSPITAL_COMMUNITY): Payer: Medicare Other | Admitting: Physical Therapy

## 2015-01-10 ENCOUNTER — Ambulatory Visit (HOSPITAL_COMMUNITY): Payer: Medicare Other | Admitting: Physical Therapy

## 2015-01-10 ENCOUNTER — Telehealth (HOSPITAL_COMMUNITY): Payer: Self-pay | Admitting: Physical Therapy

## 2015-01-10 DIAGNOSIS — M545 Low back pain: Secondary | ICD-10-CM | POA: Diagnosis not present

## 2015-01-10 DIAGNOSIS — Z6841 Body Mass Index (BMI) 40.0 and over, adult: Secondary | ICD-10-CM | POA: Diagnosis not present

## 2015-01-10 DIAGNOSIS — Z8711 Personal history of peptic ulcer disease: Secondary | ICD-10-CM | POA: Diagnosis not present

## 2015-01-10 NOTE — Telephone Encounter (Signed)
Patient a no-show for today's appointment. Called and spoke to the patient, who reported that she had an MD appointment around the same time today and just forgot to call PT clinic to cancel. Reminded patient of time and date of next appointment.  Deniece Ree PT, DPT [phone]

## 2015-01-12 ENCOUNTER — Ambulatory Visit (HOSPITAL_COMMUNITY): Payer: Medicare Other | Attending: Orthopedic Surgery | Admitting: Physical Therapy

## 2015-01-12 DIAGNOSIS — M6281 Muscle weakness (generalized): Secondary | ICD-10-CM | POA: Diagnosis not present

## 2015-01-12 DIAGNOSIS — R262 Difficulty in walking, not elsewhere classified: Secondary | ICD-10-CM | POA: Diagnosis not present

## 2015-01-12 DIAGNOSIS — M25561 Pain in right knee: Secondary | ICD-10-CM | POA: Diagnosis not present

## 2015-01-12 DIAGNOSIS — R6889 Other general symptoms and signs: Secondary | ICD-10-CM

## 2015-01-12 DIAGNOSIS — M94261 Chondromalacia, right knee: Secondary | ICD-10-CM | POA: Diagnosis not present

## 2015-01-12 DIAGNOSIS — Z789 Other specified health status: Secondary | ICD-10-CM

## 2015-01-12 DIAGNOSIS — Z658 Other specified problems related to psychosocial circumstances: Secondary | ICD-10-CM | POA: Insufficient documentation

## 2015-01-12 DIAGNOSIS — M21869 Other specified acquired deformities of unspecified lower leg: Secondary | ICD-10-CM | POA: Insufficient documentation

## 2015-01-12 NOTE — Therapy (Signed)
[place], [place], [address] Phone: [phone]   Fax:  [phone]  Physical Therapy Treatment (Re-Assessment)  Patient Details  Name: Amy Tran MRN: [id number] Date of Birth: January 15, 1956 Referring Provider: Noemi Chapel   Encounter Date: 01/12/2015      PT End of Session - 01/12/15 1025    Visit Number 11   Number of Visits 25   Date for PT Re-Evaluation 02/09/15   Authorization Type Medicare and Medicaid    Authorization Time Period 11/24/14 to 01/24/15   Authorization - Visit Number 11   Authorization - Number of Visits 20   PT Start Time 0932   PT Stop Time 1015   PT Time Calculation (min) 43 min   Activity Tolerance Patient tolerated treatment well   Behavior During Therapy [facility] for tasks assessed/performed      Past Medical History  Diagnosis Date  . Hypertension   . Fluid retention   . Diabetes mellitus without complication   . PONV (postoperative nausea and vomiting)   . Wears glasses   . Wears partial dentures     bottom  . Incomplete rotator cuff tear   . Cancer     multiple skin cancers    Past Surgical History  Procedure Laterality Date  . Gastric bypass  2004  . Carpal tunnel release  2001    left  . Cholecystectomy    . Neck surgery  1998  . Shoulder surgery  1999    left  . Ercp  2002  . Colonoscopy  2007    [id number] internal hemorrhoids. Diminutive rectal polyp at 10 cm, cold biopsied/removed. The remainder of the rectal mucosa appeared normal Swallow left-sided diverticula. Diminutive polyp at the splenic flexure cold biopsied/removed (adenomatous)  . Appendectomy    . Shoulder arthroscopy with subacromial decompression Left 06/23/2013    Procedure: LEFT SHOULDER ARTHROSCOPY WITH DEBRIDEMENT ROTATOR CUFF AND LABRUM;  Surgeon: Lorn Junes, MD;  Location: [place];  Service: Orthopedics;  Laterality: Left;  . Colonoscopy N/A 12/28/2013    Procedure:  COLONOSCOPY;  Surgeon: Daneil Dolin, MD;  [facility];  Service: Endoscopy;  Laterality: N/A;  730 - moved to 8:30 - Ginger notified pt    There were no vitals filed for this visit.  Visit Diagnosis:  Chondromalacia of knee, right  Right knee pain  Difficulty walking  Difficulty navigating stairs  Genu recurvatum, unspecified laterality  Muscle weakness      Subjective Assessment - 01/12/15 0935    Subjective Patient reports that she is doing OK today, less pain but she has noticed it hurting more as weather gets colder    Pertinent History Pt is currently in physical therapy for Rt knee pain.    How long can you stand comfortably? 12/1- 30-60 minutes    How long can you walk comfortably? 12/1- 15 minutes before it feels like its going to give out    Patient Stated Goals reduce pain; not functionally limited at this time, due to perseverence to continue to stay active.    Currently in Pain? Yes   Pain Score 3    Pain Location Knee   Pain Orientation Right            [facility] PT Assessment - 01/12/15 0001    Assessment   Medical Diagnosis R knee grade IV patella femoral chondromalacia    Referring Provider [place]    Onset Date/Surgical Date 10/27/14  Next MD Visit patient to finish course of PT then call MD Noemi Chapel for follow-up    Precautions   Precautions None   Restrictions   Weight Bearing Restrictions No   Balance Screen   Has the patient fallen in the past 6 months No   Has the patient had a decrease in activity level because of a fear of falling?  No   Is the patient reluctant to leave their home because of a fear of falling?  No   AROM   Right Hip Internal Rotation  32   Left Hip Internal Rotation  28   Right Knee Extension -4   Right Knee Flexion 120   Left Knee Extension -10   Left Knee Flexion 129   Strength   Right Hip Flexion 4/5   Right Hip Extension 5/5   Right Hip ABduction 4/5   Left Hip Flexion 4+/5   Left Hip Extension 5/5    Left Hip ABduction 4+/5   Right Knee Flexion 5/5   Right Knee Extension 5/5   Left Knee Flexion 5/5   Left Knee Extension 5/5   Right Ankle Dorsiflexion 5/5   Left Ankle Dorsiflexion 5/5   Transfers   Five time sit to stand comments  14.6                     [facility] Adult PT Treatment/Exercise - 01/12/15 0001    Knee/Hip Exercises: Stretches   Active Hamstring Stretch 3 reps;30 seconds   Active Hamstring Stretch Limitations 12in step   Piriformis Stretch Both;2 reps;30 seconds   Piriformis Stretch Limitations seated    Gastroc Stretch 3 reps;30 seconds   Gastroc Stretch Limitations slant board   Knee/Hip Exercises: Standing   Heel Raises Both;15 reps   Heel Raises Limitations toe and heel raises    Forward Lunges Both;1 set;10 reps   Forward Lunges Limitations 2 inch box    Side Lunges Both;1 set;10 reps   Side Lunges Limitations 2 inch box    Hip Abduction 15 reps   Hip Extension --   Other Standing Knee Exercises 3D hip excursions 1x15    Other Standing Knee Exercises sit to stands 1x10 with hip ER; hip abduction walks 2x62ft                 PT Education - 01/12/15 1024    Education provided Yes   Education Details progress with skilled PT services, plan of care moving forward, educated that knee and lymph treatments are considered to be separate of each other    Person(s) Educated Patient   Methods Explanation   Comprehension Verbalized understanding          PT Short Term Goals - 01/12/15 0950    PT SHORT TERM GOAL #1   Title Patient will experience no more than 3/10 pain R knee with all functional standing tasks and activities of unlimited duration    Baseline 12/1- pain still getting up to a 10, depends on what she's doing and the weather    Time 3   Period Weeks   Status On-going   PT SHORT TERM GOAL #2   Title Patient to demonstrate WNL hip IR motion to improve overall mechanics and reduce stress on knees during mobility    Time 3    Period Weeks   Status On-going   PT SHORT TERM GOAL #3   Title Patient to be aware of posture and static mechanics at all times  to avoid increased stress on joints through genu recurvatum of knees    Time 3   Period Weeks   Status On-going   PT SHORT TERM GOAL #4   Title Patient to be independent in correctly and consistently performing appropriate HEP, to be updated PRN    Time 3   Period Weeks   Status Achieved   PT SHORT TERM GOAL #5   Title Pt will have a 25% reduction for improved comfort and clothes fitting    Baseline 12/28/2014- 11040 cc   Time 2   Period Weeks   PT SHORT TERM GOAL #6   Title Pt to be able to identify the signs and sx of celllulitis due to prolong lymphstasis    Baseline 12/28/2014- pt  unaware of risks    Time 2   Period Weeks   PT SHORT TERM GOAL #7   Title Pt will be I in remedial exercises and diaphragmic breatining to promote lymph flow   Baseline 12/28/14 - unaware of exercises to assist in lymphatic system    Time 2   Period Weeks           PT Long Term Goals - 01/12/15 [id number]    PT LONG TERM GOAL #1   Title At 8 weeks patient will demonstrate improved functional mobility as evident in 5x sit to stand in less than 11 seconds to prepare for greater indep in mobility at home.    Time 7   Period Weeks   Status On-going   PT LONG TERM GOAL #2   Title Patient to demonstrate 5/5 muscle strength bilateral lower extremities as well as at least 4/5 strength in proximal musculature    Time 7   Period Weeks   Status Achieved   PT LONG TERM GOAL #3   Title Patient to be able to ascend and descend full flight of stairs reciprocally with no raling and pain in R knee no more than 2/10, no circumduction or unsteadiness    Baseline 12/1- goes back and forth, depends on day    PT LONG TERM GOAL #4   Title Patient to report that she has been performing at least 30 minutes of light-moderate intensity physical activity at least 3 times per week in order to  maintain functional gains, promote improved health habits, and reduce chances of recurrence of knee pain    Baseline 12/1- went to [facility] the other day, but is still limited by pain with exercise    PT LONG TERM GOAL #5   Title Pt to have a 50% reduction for improved functional mobility and decreased discomfort   Time 4   Status New   PT LONG TERM GOAL #6   Title Pt to be I with donning and doffing of vcompression garments   Time 4   Period Weeks   Status New   PT LONG TERM GOAL #7   Title Pt to be I with home management of LE edema   Time 4   Period Weeks   Status New               Plan - 01/12/15 1026    Clinical Impression Statement Re-assessed today as it was today's last scheduled session. Patient does not reveal any signficant improveemnts and does continue to be limited by pain during many functional activities throughout her day; she reports, however that while she has not noticed many significant changes in her knees she does notice alleviation of symptoms in  her back. At this time patient may benefit from skilled PT services moving forward, however due to lack of progress recommended that patient go to see MD  before continuing with PT for knee and was given advanced HEP  for home use until iti s determined if she will terminate PT services or continue. Patient to be put  on hold for knee for now and will call PT office after MD appointment;  she reports she is still working on getting lymph bandages and will call clinic when she is ready to schedule this.    Pt will benefit from skilled therapeutic intervention in order to improve on the following deficits Pain;Decreased activity tolerance;Difficulty walking;Increased edema   Rehab Potential Good   Clinical Impairments Affecting Rehab Potential chronicity of low back rigidity.    PT Frequency 3x / week   PT Duration 4 weeks   PT Treatment/Interventions Patient/family education;Manual techniques;Manual lymph  drainage;Compression bandaging   PT Next Visit Plan on hold for skilled PT for knee; to continue with lymph PT once she has purchased bandages    Consulted and Agree with Plan of Care Patient        Problem List Patient Active Problem List   Diagnosis Date Noted  . Diverticulosis of colon without hemorrhage   . Hx of adenomatous colonic polyps 12/01/2013  . Pain in joint, shoulder region 06/24/2013  . Muscle weakness (generalized) 06/24/2013  . Incomplete rotator cuff tear   . Diabetes mellitus without complication ([place])   . PONV (postoperative nausea and vomiting)   . Fluid retention   . Hypertension     Physical Therapy Progress Note  Dates of Reporting Period: 12/23/14 to 01/12/15  Objective Reports of Subjective Statement: see above   Objective Measurements: see above   Goal Update: see above   Plan: see above   Reason Skilled Services are Required: on hold for skilled PT for knee; to continue with skilled PT for lymphedema once she has purchased bandages    Deniece Ree PT, DPT [place], [place], [address] Phone: [phone]   Fax:  [phone]  Name: Amy Tran MRN: [id number] Date of Birth: [birth year]

## 2015-01-12 NOTE — Patient Instructions (Addendum)
   Heel Raises  Rise up onto toes, then return.  TOES RAISES - DORSIFLEXION STANDING  In a standing position with your feet on the ground, raise up your forefoot and toes as you bend at your ankle.  Repeat 15 times each, twice a day.    Hip Abduction  Standing tall, lift one leg out to the side then return. Repeat 15 times each leg, twice a day.   Functional Quadriceps: Sit to Stand    Sit on edge of chair, feet flat on floor. Turn your legs so your feet are pointing out to the side. Stand upright, extending knees fully. Repeat __10__ times per set. Do _1___ sets per session. Do __2__ sessions per day.  http://orth.exer.us/735   Copyright  VHI. All rights reserved.     SQUATS  While standing with feet shoulder width apart and in front of a stable support for balance assist if needed, bend your knees and lower your body towards the floor. Your body weight should mostly be directed through the heels of your feet. Return to a standing position.   Knees should bend in line with the 2nd toe and not pass the front of the foot.  Repeat 10 times, twice a day.

## 2015-01-30 DIAGNOSIS — E119 Type 2 diabetes mellitus without complications: Secondary | ICD-10-CM | POA: Diagnosis not present

## 2015-01-30 DIAGNOSIS — I10 Essential (primary) hypertension: Secondary | ICD-10-CM | POA: Diagnosis not present

## 2015-01-30 DIAGNOSIS — Z79899 Other long term (current) drug therapy: Secondary | ICD-10-CM | POA: Diagnosis not present

## 2015-02-07 DIAGNOSIS — E1129 Type 2 diabetes mellitus with other diabetic kidney complication: Secondary | ICD-10-CM | POA: Diagnosis not present

## 2015-02-07 DIAGNOSIS — H05229 Edema of unspecified orbit: Secondary | ICD-10-CM | POA: Diagnosis not present

## 2015-02-07 DIAGNOSIS — Z8711 Personal history of peptic ulcer disease: Secondary | ICD-10-CM | POA: Diagnosis not present

## 2015-03-14 DIAGNOSIS — L309 Dermatitis, unspecified: Secondary | ICD-10-CM | POA: Diagnosis not present

## 2015-04-04 DIAGNOSIS — M1711 Unilateral primary osteoarthritis, right knee: Secondary | ICD-10-CM | POA: Diagnosis not present

## 2015-04-18 ENCOUNTER — Ambulatory Visit (HOSPITAL_COMMUNITY): Payer: Medicare Other | Attending: Orthopedic Surgery

## 2015-04-18 ENCOUNTER — Encounter (HOSPITAL_COMMUNITY): Payer: Self-pay

## 2015-04-18 DIAGNOSIS — M94261 Chondromalacia, right knee: Secondary | ICD-10-CM | POA: Diagnosis not present

## 2015-04-18 DIAGNOSIS — R29898 Other symptoms and signs involving the musculoskeletal system: Secondary | ICD-10-CM | POA: Insufficient documentation

## 2015-04-18 DIAGNOSIS — R262 Difficulty in walking, not elsewhere classified: Secondary | ICD-10-CM | POA: Insufficient documentation

## 2015-04-18 DIAGNOSIS — R269 Unspecified abnormalities of gait and mobility: Secondary | ICD-10-CM | POA: Insufficient documentation

## 2015-04-18 DIAGNOSIS — G729 Myopathy, unspecified: Secondary | ICD-10-CM | POA: Diagnosis not present

## 2015-04-18 DIAGNOSIS — M21869 Other specified acquired deformities of unspecified lower leg: Secondary | ICD-10-CM | POA: Insufficient documentation

## 2015-04-18 DIAGNOSIS — Z658 Other specified problems related to psychosocial circumstances: Secondary | ICD-10-CM | POA: Diagnosis present

## 2015-04-18 DIAGNOSIS — M6281 Muscle weakness (generalized): Secondary | ICD-10-CM | POA: Diagnosis not present

## 2015-04-18 DIAGNOSIS — M25561 Pain in right knee: Secondary | ICD-10-CM | POA: Diagnosis not present

## 2015-04-18 DIAGNOSIS — M79661 Pain in right lower leg: Secondary | ICD-10-CM | POA: Diagnosis not present

## 2015-04-18 DIAGNOSIS — M6289 Other specified disorders of muscle: Secondary | ICD-10-CM

## 2015-04-18 NOTE — Therapy (Signed)
[place], [place], [address] Phone: [phone]   Fax:  [phone]  Physical Therapy Evaluation  Patient Details  Name: Amy Tran MRN: [id number] Date of Birth: 05-Feb-1956 Referring Provider: Dr. Elsie Saas  Encounter Date: 04/18/2015      PT End of Session - 04/18/15 1943    Visit Number 1   Number of Visits 13   Date for PT Re-Evaluation 05/18/15   Authorization Type U[employer] Medicare    Authorization Time Period 04/18/2015 to 05/30/2015   PT Start Time 1735   PT Stop Time 1816   PT Time Calculation (min) 41 min   Activity Tolerance Patient tolerated treatment well   Behavior During Therapy [facility] for tasks assessed/performed      Past Medical History  Diagnosis Date  . Hypertension   . Fluid retention   . Diabetes mellitus without complication ([place])   . PONV (postoperative nausea and vomiting)   . Wears glasses   . Wears partial dentures     bottom  . Incomplete rotator cuff tear   . Cancer [facility])     multiple skin cancers    Past Surgical History  Procedure Laterality Date  . Gastric bypass  2004  . Carpal tunnel release  2001    left  . Cholecystectomy    . Neck surgery  1998  . Shoulder surgery  1999    left  . Ercp  2002  . Colonoscopy  2007    [id number] internal hemorrhoids. Diminutive rectal polyp at 10 cm, cold biopsied/removed. The remainder of the rectal mucosa appeared normal Swallow left-sided diverticula. Diminutive polyp at the splenic flexure cold biopsied/removed (adenomatous)  . Appendectomy    . Shoulder arthroscopy with subacromial decompression Left 06/23/2013    Procedure: LEFT SHOULDER ARTHROSCOPY WITH DEBRIDEMENT ROTATOR CUFF AND LABRUM;  Surgeon: Lorn Junes, MD;  Location: [place];  Service: Orthopedics;  Laterality: Left;  . Colonoscopy N/A 12/28/2013    Procedure: COLONOSCOPY;  Surgeon: Daneil Dolin, MD;  [facility];  Service:  Endoscopy;  Laterality: N/A;  730 - moved to 8:30 - Ginger notified pt    There were no vitals filed for this visit.  Visit Diagnosis:  Pain of right knee and lower leg - Plan: PT plan of care cert/re-cert  Muscle tightness - Plan: PT plan of care cert/re-cert  Weakness of right leg - Plan: PT plan of care cert/re-cert  Abnormal gait - Plan: PT plan of care cert/re-cert      Subjective Assessment - 04/18/15 1744    Subjective Amy Tran is a 60 yo female who currently c/o medial and posterior R knee pain that insidiously began 1 year ago with symptoms worsening within the last month. Pt reported previous PT treatment for R knee pain associated to "arthritis". Pt sought further medical attention from Dr. Noemi Chapel and was diagnosed with R medial gastroc tendonitis. Amy Tran reports that she is most limited with standing and walking activities and requires to take frequent seated rest breaks due to increased R knee pain. According to the pt, she was recently prescribed with Prednisone to manage her inflammation. Pt was unable to recall dosage of newly prescribed med upon questioning by therapist. Pt will plan to bring it with her at her next PT visit.    Pertinent History HTN, DM, skin CA, and R knee pain    Limitations Standing;Walking;Other (comment);House hold activities  squatting  How long can you sit comfortably? No limit   How long can you stand comfortably? increased R knee pain >30 minutes    How long can you walk comfortably? increased R knee pain >25 minutes    Diagnostic tests Recent MRI= Unknown results. Will plan to call Dr. Archie Endo office to request MRI results.    Patient Stated Goals Reduce her R knee pain    Currently in Pain? Yes   Pain Score 5   R posteromedial pain ranges between 4-9/10    Pain Location Knee   Pain Orientation Right;Posterior;Medial   Pain Descriptors / Indicators Aching;Sore   Pain Type Chronic pain   Pain Radiating Towards None    Pain  Onset More than a month ago   Pain Frequency Intermittent   Aggravating Factors  Standing for prolonged periods of time, walking long distances, and squatting    Pain Relieving Factors Massage and previously prescribed stretches from PT    Effect of Pain on Daily Activities Does not self limit    Multiple Pain Sites No            [facility] PT Assessment - 04/18/15 0001    Assessment   Medical Diagnosis R posteromedial knee pain    Referring Provider Dr. Elsie Saas   Onset Date/Surgical Date 04/04/15   Hand Dominance Right   Next MD Visit beginning of 05/2015   Precautions   Precautions None   Restrictions   Weight Bearing Restrictions No   Balance Screen   Has the patient fallen in the past 6 months No   Has the patient had a decrease in activity level because of a fear of falling?  Yes   Is the patient reluctant to leave their home because of a fear of falling?  No   Prior Function   Level of Independence Independent   Vocation Part time employment   Leisure works at [employer]; has to do 6 hour shifts 3 x/day , needs to be up on her feet    Observation/Other Assessments   Focus on Therapeutic Outcomes (FOTO)  55% limitation    Sensation   Light Touch Appears Intact   Posture/Postural Control   Posture Comments bilateral genu valgus; B feet pronation   AROM   Right Knee Extension 0  -6 degrees into hyperextension    Right Knee Flexion 127   Left Knee Extension 0  -6 degrees into hyperextension    Left Knee Flexion 130   Right/Left Ankle Right;Left   Right Ankle Dorsiflexion 12   Right Ankle Plantar Flexion 49   Left Ankle Dorsiflexion 12   Left Ankle Plantar Flexion 50   Strength   Right Hip Flexion 4+/5   Right Hip Extension 4+/5   Right Hip ABduction 4+/5   Left Hip Flexion 4+/5   Left Hip Extension 4+/5   Left Hip ABduction 4+/5   Right Knee Flexion 5/5   Right Knee Extension 5/5   Left Knee Flexion 5/5   Left Knee Extension 5/5   Right/Left Ankle  Right;Left   Right Ankle Dorsiflexion 5/5   Right Ankle Plantar Flexion 4-/5   Right Ankle Inversion 5/5   Right Ankle Eversion 5/5   Left Ankle Dorsiflexion 5/5   Left Ankle Plantar Flexion 4-/5   Left Ankle Inversion 5/5   Left Ankle Eversion 5/5   Flexibility   Soft Tissue Assessment /Muscle Length yes  Minor gastroc-soleus tightness, bilateral    Hamstrings HS 90/90 (L/R)= -17 deg/ -  15 deg    Quadriceps Thomas test was positive for hip flexor and quad tightness, bilateral     Palpation   Patella mobility Minor limitations assessed with R patellar mobility in S<>I direction; M<>L patellar mobility=WNL    Palpation comment moderate tenderness to palpation of the medial joint line of the R knee and R distal semitendinsous/ semimembranosous. Minor tenderness to palpation of  R proximal insertion of the medial head of the gastroc muscle; No tenderness of the lateral joint line of the R knee or muscle belly of the R gastroc   Special Tests    Special Tests Knee Special Tests   Knee Special tests  other   other    Findings Negative   Comments Ligamentus and meniscal test completed with no pain or hypermobility palpated    Ambulation/Gait   Ambulation/Gait Yes   Ambulation/Gait Assistance 7: Independent   Ambulation Distance (Feet) 100 Feet   Assistive device None   Gait Pattern Antalgic                           PT Education - 04/18/15 1941    Education provided Yes   Education Details Educated pt on cryotherapy use/parameters (15-20 minutes, 3-4x/day, to painful site with use of skin barrier), PT eval findings, and proper fitting shoes    Person(s) Educated Patient   Methods Explanation;Demonstration   Comprehension Verbalized understanding;Returned demonstration          PT Short Term Goals - 04/18/15 2003    PT SHORT TERM GOAL #1   Title Patient to be independent in correctly and consistently performing appropriate HEP, to be updated PRN.    Time 2    Period Weeks   Status New   PT SHORT TERM GOAL #2   Title Patient will report decreased max R posteromedial knee pain to 6/10 on a VAS in order to improve tolerance with standing/walking activities for >35 minutes.    Time 3   Period Weeks   Status New   PT SHORT TERM GOAL #3   Title Patient will present with less inflammation and tenderness upon palpation of the medial R knee in order to improve tolerance with work related activities.    Time 3   Period Weeks   Status New           PT Long Term Goals - 04/18/15 2007    PT LONG TERM GOAL #1   Title Patient will report decreased R knee pain to 0-5/10 on a VAS in order to improve tolerance with prolonged standing to >45 minutes.    Time 6   Period Weeks   Status New   PT LONG TERM GOAL #2   Title Patient will improve B gastroc-soleus flexibility with B ankle AROM measured to 0-20 degrees in order to reduce stress on the gastroc-soleus muscle during squatting activities.    Time 6   Period Weeks   Status New   PT LONG TERM GOAL #3   Title Patient will improve B LE strength to 5/5 MMT grade in order to improve knee stabilization and reduce stress on the R knee with community ambulation >45 minutes.    Time 6   Period Weeks   Status New   PT LONG TERM GOAL #4   Title Patient's FOTO limitation score will reduce from 55% to 30% in order to progress towards decreased pain levels with leisure activities.    Time 6  Period Weeks   Status New               Plan - 2015/05/14 1945    Clinical Impression Statement Amy Tran is a 60 yo female who was referred to outpatient PT with a diagnosis of R medial gastrocnemius tendonitis and R knee OA. The pt presents with signs and symptoms that are consistent with MD diagnosis. The pt currently presents with impaired gastroc-soleus/HS/quad flexibility, limited B gastroc-soleus strength, R medial and posteromedial knee pain, limited S<>I patellar mobility, and tenderness to palpation of  the distal medial HS and proximal medial gastroc tendon. Ligamentous and meniscal test were completed to the R knee with no symptom provocation elicited. Bilateral quad and HS strength were WNL with no pain provoked with resistance. The pt would benefit from skilled PT fo 2x/week for 6 weeks to address current deficits in order to progress towards improved activity tolerance and quality of life. The pt is in agreement with proposed PT POC and requested 2x/week instead of 3x/week.    Pt will benefit from skilled therapeutic intervention in order to improve on the following deficits Decreased range of motion;Difficulty walking;Increased fascial restricitons;Obesity;Pain;Hypomobility;Impaired flexibility;Improper body mechanics;Decreased strength   Rehab Potential Good   Clinical Impairments Affecting Rehab Potential chronic nature of R knee pain    PT Frequency 2x / week   PT Duration 6 weeks   PT Treatment/Interventions ADLs/Self Care Home Management;Cryotherapy;Electrical Stimulation;Iontophoresis 4mg /ml Dexamethasone;Moist Heat;Therapeutic exercise;Therapeutic activities;Gait training;Ultrasound;Neuromuscular re-education;Patient/family education;Manual techniques;Passive range of motion   PT Next Visit Plan Next visit to focus on ultrasound to the R posteromedial knee using non-thermal parameters, STM/cross friction to distal medial HS and proximal gastroc tendon, gastroc-soleus stretch in long sit, and R ankle PF with thera-band. Initiate iontophoresis within the next 1-2 visits per MD order.    PT Home Exercise Plan HEP not initiated this visit. Add gastroc-soleus stretch to HEP at next PT visit.    Recommended Other Services None at this time    Consulted and Agree with Plan of Care Patient          G-Codes - 2015/05/14 09-Jun-2010    Functional Assessment Tool Used FOTO limitation= 55%   Functional Limitation Mobility: Walking and moving around   Mobility: Walking and Moving Around Current Status  [id number]) At least 40 percent but less than 60 percent impaired, limited or restricted   Mobility: Walking and Moving Around Goal Status [phone]) At least 20 percent but less than 40 percent impaired, limited or restricted       Problem List Patient Active Problem List   Diagnosis Date Noted  . Diverticulosis of colon without hemorrhage   . Hx of adenomatous colonic polyps 12/01/2013  . Pain in joint, shoulder region 06/24/2013  . Muscle weakness (generalized) 06/24/2013  . Incomplete rotator cuff tear   . Diabetes mellitus without complication ([place])   . PONV (postoperative nausea and vomiting)   . Fluid retention   . Hypertension     Garen Lah, PT, DPT  05-14-15, 8:19 PM  [facility] [address] [place], [place], [address] Phone: [phone]   Fax:  [phone]  Name: TROYLYNN MALOY MRN: [id number] Date of Birth: 1956-01-20

## 2015-04-20 ENCOUNTER — Ambulatory Visit (HOSPITAL_COMMUNITY): Payer: Medicare Other

## 2015-04-20 DIAGNOSIS — R29898 Other symptoms and signs involving the musculoskeletal system: Secondary | ICD-10-CM

## 2015-04-20 DIAGNOSIS — R269 Unspecified abnormalities of gait and mobility: Secondary | ICD-10-CM

## 2015-04-20 DIAGNOSIS — M25561 Pain in right knee: Secondary | ICD-10-CM

## 2015-04-20 DIAGNOSIS — G729 Myopathy, unspecified: Secondary | ICD-10-CM | POA: Diagnosis not present

## 2015-04-20 DIAGNOSIS — R262 Difficulty in walking, not elsewhere classified: Secondary | ICD-10-CM | POA: Diagnosis not present

## 2015-04-20 DIAGNOSIS — M79661 Pain in right lower leg: Principal | ICD-10-CM

## 2015-04-20 DIAGNOSIS — M6281 Muscle weakness (generalized): Secondary | ICD-10-CM | POA: Diagnosis not present

## 2015-04-20 DIAGNOSIS — M21869 Other specified acquired deformities of unspecified lower leg: Secondary | ICD-10-CM | POA: Diagnosis not present

## 2015-04-20 DIAGNOSIS — M6289 Other specified disorders of muscle: Secondary | ICD-10-CM

## 2015-04-20 DIAGNOSIS — M94261 Chondromalacia, right knee: Secondary | ICD-10-CM | POA: Diagnosis not present

## 2015-04-20 NOTE — Therapy (Signed)
[place], [place], [address] Phone: [phone]   Fax:  [phone]  Physical Therapy Treatment  Patient Details  Name: Amy Tran MRN: [id number] Date of Birth: 06-28-1955 Referring Provider: Dr. Elsie Saas  Encounter Date: 04/20/2015      PT End of Session - 04/20/15 1840    Visit Number 2   Number of Visits 13   Date for PT Re-Evaluation 05/18/15   Authorization Type UHC Medicare    Authorization Time Period 04/18/2015 to 05/30/2015   PT Start Time 1732   PT Stop Time 1816   PT Time Calculation (min) 44 min   Activity Tolerance Patient tolerated treatment well   Behavior During Therapy [facility] for tasks assessed/performed      Past Medical History  Diagnosis Date  . Hypertension   . Fluid retention   . Diabetes mellitus without complication ([place])   . PONV (postoperative nausea and vomiting)   . Wears glasses   . Wears partial dentures     bottom  . Incomplete rotator cuff tear   . Cancer [facility])     multiple skin cancers    Past Surgical History  Procedure Laterality Date  . Gastric bypass  2004  . Carpal tunnel release  2001    left  . Cholecystectomy    . Neck surgery  1998  . Shoulder surgery  1999    left  . Ercp  2002  . Colonoscopy  2007    [id number] internal hemorrhoids. Diminutive rectal polyp at 10 cm, cold biopsied/removed. The remainder of the rectal mucosa appeared normal Swallow left-sided diverticula. Diminutive polyp at the splenic flexure cold biopsied/removed (adenomatous)  . Appendectomy    . Shoulder arthroscopy with subacromial decompression Left 06/23/2013    Procedure: LEFT SHOULDER ARTHROSCOPY WITH DEBRIDEMENT ROTATOR CUFF AND LABRUM;  Surgeon: Lorn Junes, MD;  Location: [place];  Service: Orthopedics;  Laterality: Left;  . Colonoscopy N/A 12/28/2013    Procedure: COLONOSCOPY;  Surgeon: Daneil Dolin, MD;  [facility];  Service:  Endoscopy;  Laterality: N/A;  730 - moved to 8:30 - Ginger notified pt    There were no vitals filed for this visit.  Visit Diagnosis:  Pain of right knee and lower leg  Muscle tightness  Weakness of right leg  Abnormal gait      Subjective Assessment - 04/20/15 1738    Subjective Pt reported moderate soreness of her R medial calf and medial knee upon arrival that was rated a 4/10 on a VAS. No changes reported since last PT visit. Pt noted that she worked earlier today and attributes her soreness to work related activities.   Pertinent History HTN, DM, skin CA, and R knee pain    Limitations Standing;Walking;Other (comment);House hold activities   How long can you sit comfortably? No limit   How long can you stand comfortably? increased R knee pain >30 minutes    How long can you walk comfortably? increased R knee pain >25 minutes    Diagnostic tests Recent MRI= Unknown results. Will plan to call Dr. Archie Endo office to request MRI results.    Patient Stated Goals Reduce her R knee pain    Currently in Pain? Yes   Pain Score 4    Pain Location Knee   Pain Orientation Right;Posterior;Medial   Pain Descriptors / Indicators Aching;Sore   Pain Type Chronic pain   Pain Radiating Towards None  Pain Onset More than a month ago   Pain Frequency Intermittent   Aggravating Factors  Standing long periods of time, walking long distances, and squatting    Pain Relieving Factors Massage and stretches    Effect of Pain on Daily Activities Does not self limit   Multiple Pain Sites No                         [facility] Adult PT Treatment/Exercise - 04/20/15 0001    Knee/Hip Exercises: Stretches   Gastroc Stretch 4 reps;30 seconds;Right   Gastroc Stretch Limitations with strap   Modalities   Modalities Cryotherapy;Ultrasound   Cryotherapy   Number Minutes Cryotherapy 10 Minutes   Cryotherapy Location Other (comment)  medial and proximal R calf   Type of Cryotherapy Ice  pack   Ultrasound   Ultrasound Location R medial gastroc head and R medial joint line    Ultrasound Parameters 0.05 w/cm2; 4 minutes each to location; prone position; pulsed    Ultrasound Goals Pain;Other (Comment)  reduce inflammation    Manual Therapy   Manual Therapy Soft tissue mobilization;Passive ROM   Soft tissue mobilization STM to R calf/medial head of gastroc in prone position: Techniques included palmar spreading x 5 minutes, active release x 4 minutes, strain-counter-strain x 90 sec hold x 3, and cross friction x 2 sets of 2 minutes.    Passive ROM R ankle and knee PROM x5 minutes                 PT Education - 04/20/15 1840    Education provided Yes   Education Details HEP, cryotherapy use and parameters, and proper fitting shoes    Person(s) Educated Patient   Methods Explanation;Demonstration;Handout   Comprehension Verbalized understanding;Returned demonstration          PT Short Term Goals - 04/18/15 2003    PT SHORT TERM GOAL #1   Title Patient to be independent in correctly and consistently performing appropriate HEP, to be updated PRN.    Time 2   Period Weeks   Status New   PT SHORT TERM GOAL #2   Title Patient will report decreased max R posteromedial knee pain to 6/10 on a VAS in order to improve tolerance with standing/walking activities for >35 minutes.    Time 3   Period Weeks   Status New   PT SHORT TERM GOAL #3   Title Patient will present with less inflammation and tenderness upon palpation of the medial R knee in order to improve tolerance with work related activities.    Time 3   Period Weeks   Status New           PT Long Term Goals - 04/18/15 2007    PT LONG TERM GOAL #1   Title Patient will report decreased R knee pain to 0-5/10 on a VAS in order to improve tolerance with prolonged standing to >45 minutes.    Time 6   Period Weeks   Status New   PT LONG TERM GOAL #2   Title Patient will improve B gastroc-soleus flexibility  with B ankle AROM measured to 0-20 degrees in order to reduce stress on the gastroc-soleus muscle during squatting activities.    Time 6   Period Weeks   Status New   PT LONG TERM GOAL #3   Title Patient will improve B LE strength to 5/5 MMT grade in order to improve knee stabilization and reduce  stress on the R knee with community ambulation >45 minutes.    Time 6   Period Weeks   Status New   PT LONG TERM GOAL #4   Title Patient's FOTO limitation score will reduce from 55% to 30% in order to progress towards decreased pain levels with leisure activities.    Time 6   Period Weeks   Status New               Plan - 04/20/15 1842    Clinical Impression Statement PT tx focused on manual therapy techniques to reduce trigger points and improve tissue extensibility, ultrasound (non-thermal) and cryotherapy to reduce inflammation, and pt education to ensure proper completion of HEP and pain management strategies. Significant palpable tenderness assessed t/o the R gastroc with most trigger points assessed at the proximal medial head of the gastroc muscle. Completed STM (active release, strain counter strain, palmar spreading, and cross friction) with fair tolerance. Completed non-thermal ultrasound to the R medial gastroc and medial joint line with good tolerance reported. Ended PT tx with cold pack applied to the R calf region. Skin was WNL prior to and after use of modalities. R medial calf pain was reduced to 3/10 on a VAS at the completion of PT tx. Added gastroc stretch to HEP with good understanding demo. Continue with current POC.    Pt will benefit from skilled therapeutic intervention in order to improve on the following deficits Decreased range of motion;Difficulty walking;Increased fascial restricitons;Obesity;Pain;Hypomobility;Impaired flexibility;Improper body mechanics;Decreased strength   Rehab Potential Good   Clinical Impairments Affecting Rehab Potential chronic nature of R knee  pain    PT Frequency 2x / week   PT Duration 6 weeks   PT Treatment/Interventions ADLs/Self Care Home Management;Cryotherapy;Electrical Stimulation;Iontophoresis 4mg /ml Dexamethasone;Moist Heat;Therapeutic exercise;Therapeutic activities;Gait training;Ultrasound;Neuromuscular re-education;Patient/family education;Manual techniques;Passive range of motion   PT Next Visit Plan Next visit to focus on ultrasound to the R posteromedial knee using non-thermal parameters, STM/cross friction to distal medial HS and proximal gastroc tendon, gastroc-soleus stretch in long sit, and R ankle PF with thera-band. Initiate iontophoresis within the next 1-2 visits per MD order.    PT Home Exercise Plan  Added gastroc-soleus stretch to HEP    Consulted and Agree with Plan of Care Patient        Problem List Patient Active Problem List   Diagnosis Date Noted  . Diverticulosis of colon without hemorrhage   . Hx of adenomatous colonic polyps 12/01/2013  . Pain in joint, shoulder region 06/24/2013  . Muscle weakness (generalized) 06/24/2013  . Incomplete rotator cuff tear   . Diabetes mellitus without complication ([place])   . PONV (postoperative nausea and vomiting)   . Fluid retention   . Hypertension     Garen Lah, PT, DPT  04/20/2015, 6:53 PM  [facility] [address] [place], [place], [address] Phone: [phone]   Fax:  [phone]  Name: Amy Tran MRN: [id number] Date of Birth: 10-29-1955

## 2015-04-20 NOTE — Patient Instructions (Addendum)
   CALF STRETCH WITH TOWEL  While in a seated position, hook a towel under your foot and pull your ankle back until a gentle stretch is felt on your calf area.  Keep your knee in a straightened position during the stretch.  30 sec hold x 3 reps on each leg, complete 2x/day

## 2015-05-01 ENCOUNTER — Ambulatory Visit (HOSPITAL_COMMUNITY): Payer: Medicare Other

## 2015-05-03 ENCOUNTER — Encounter (HOSPITAL_COMMUNITY): Payer: Medicare Other

## 2015-05-09 ENCOUNTER — Encounter (HOSPITAL_COMMUNITY): Payer: Medicare Other

## 2015-05-09 DIAGNOSIS — I10 Essential (primary) hypertension: Secondary | ICD-10-CM | POA: Diagnosis not present

## 2015-05-09 DIAGNOSIS — E785 Hyperlipidemia, unspecified: Secondary | ICD-10-CM | POA: Diagnosis not present

## 2015-05-09 DIAGNOSIS — E119 Type 2 diabetes mellitus without complications: Secondary | ICD-10-CM | POA: Diagnosis not present

## 2015-05-09 DIAGNOSIS — Z79899 Other long term (current) drug therapy: Secondary | ICD-10-CM | POA: Diagnosis not present

## 2015-05-11 ENCOUNTER — Ambulatory Visit (HOSPITAL_COMMUNITY): Payer: Medicare Other

## 2015-05-11 DIAGNOSIS — M25561 Pain in right knee: Secondary | ICD-10-CM

## 2015-05-11 DIAGNOSIS — M79661 Pain in right lower leg: Secondary | ICD-10-CM | POA: Diagnosis not present

## 2015-05-11 DIAGNOSIS — R262 Difficulty in walking, not elsewhere classified: Secondary | ICD-10-CM | POA: Diagnosis not present

## 2015-05-11 DIAGNOSIS — M21869 Other specified acquired deformities of unspecified lower leg: Secondary | ICD-10-CM | POA: Diagnosis not present

## 2015-05-11 DIAGNOSIS — R269 Unspecified abnormalities of gait and mobility: Secondary | ICD-10-CM

## 2015-05-11 DIAGNOSIS — G729 Myopathy, unspecified: Secondary | ICD-10-CM | POA: Diagnosis not present

## 2015-05-11 DIAGNOSIS — M94261 Chondromalacia, right knee: Secondary | ICD-10-CM | POA: Diagnosis not present

## 2015-05-11 DIAGNOSIS — R29898 Other symptoms and signs involving the musculoskeletal system: Secondary | ICD-10-CM | POA: Diagnosis not present

## 2015-05-11 DIAGNOSIS — Z789 Other specified health status: Secondary | ICD-10-CM

## 2015-05-11 DIAGNOSIS — M6281 Muscle weakness (generalized): Secondary | ICD-10-CM

## 2015-05-11 DIAGNOSIS — M6289 Other specified disorders of muscle: Secondary | ICD-10-CM

## 2015-05-11 DIAGNOSIS — R6889 Other general symptoms and signs: Secondary | ICD-10-CM

## 2015-05-11 NOTE — Therapy (Signed)
[place], [place], [address] Phone: [phone]   Fax:  [phone]  Physical Therapy Treatment  Patient Details  Name: Amy Tran MRN: [id number] Date of Birth: 06/29/1955 Referring Provider: Dr. Elsie Saas  Encounter Date: 05/11/2015      PT End of Session - 05/11/15 1654    Visit Number 3   Number of Visits 13   Date for PT Re-Evaluation 05/18/15   Authorization Type [employer] Medicare    Authorization Time Period 04/18/2015 to 05/30/2015   Authorization - Visit Number 3   Authorization - Number of Visits 10   PT Start Time [id number]   PT Stop Time 1737   PT Time Calculation (min) 49 min   Activity Tolerance Patient tolerated treatment well   Behavior During Therapy [facility] for tasks assessed/performed      Past Medical History  Diagnosis Date  . Hypertension   . Fluid retention   . Diabetes mellitus without complication ([place])   . PONV (postoperative nausea and vomiting)   . Wears glasses   . Wears partial dentures     bottom  . Incomplete rotator cuff tear   . Cancer [facility])     multiple skin cancers    Past Surgical History  Procedure Laterality Date  . Gastric bypass  2004  . Carpal tunnel release  2001    left  . Cholecystectomy    . Neck surgery  1998  . Shoulder surgery  1999    left  . Ercp  2002  . Colonoscopy  2007    [id number] internal hemorrhoids. Diminutive rectal polyp at 10 cm, cold biopsied/removed. The remainder of the rectal mucosa appeared normal Swallow left-sided diverticula. Diminutive polyp at the splenic flexure cold biopsied/removed (adenomatous)  . Appendectomy    . Shoulder arthroscopy with subacromial decompression Left 06/23/2013    Procedure: LEFT SHOULDER ARTHROSCOPY WITH DEBRIDEMENT ROTATOR CUFF AND LABRUM;  Surgeon: Lorn Junes, MD;  Location: [place];  Service: Orthopedics;  Laterality: Left;  . Colonoscopy N/A 12/28/2013    Procedure:  COLONOSCOPY;  Surgeon: Daneil Dolin, MD;  [facility];  Service: Endoscopy;  Laterality: N/A;  730 - moved to 8:30 - Ginger notified pt    There were no vitals filed for this visit.  Visit Diagnosis:  Pain of right knee and lower leg  Muscle tightness  Weakness of right leg  Abnormal gait  Chondromalacia of knee, right  Right knee pain  Difficulty walking  Difficulty navigating stairs  Genu recurvatum, unspecified laterality  Muscle weakness      Subjective Assessment - 05/11/15 1648    Subjective Pt stated she has been working all day, standing for long periods of time.  Current pain scale 4/10.   Pertinent History HTN, DM, skin CA, and R knee pain    Patient Stated Goals Reduce her R knee pain    Currently in Pain? Yes   Pain Score 4    Pain Location Knee   Pain Orientation Right;Posterior;Medial   Pain Descriptors / Indicators Aching   Pain Type Chronic pain   Pain Radiating Towards None   Pain Onset More than a month ago   Pain Frequency Intermittent   Aggravating Factors  Standing long periods of time, walking long distances, and squatting   Pain Relieving Factors Massage and stretches   Effect of Pain on Daily Activities Does not self limit  [place] Adult PT Treatment/Exercise - 05/11/15 0001    Knee/Hip Exercises: Stretches   Gastroc Stretch 4 reps;30 seconds;Right   Gastroc Stretch Limitations with strap   Modalities   Modalities Iontophoresis   Iontophoresis   Type of Iontophoresis Dexamethasone   Location Rt medial distal hamstrings and proximal medial gastroc   Dose 4.0   Time 9   Manual Therapy   Manual Therapy Soft tissue mobilization;Passive ROM   Soft tissue mobilization STM to R calf/medial head of gastroc in prone position: Techniques included palmar spreading x 5 minutes, active release x 4 minutes, strain-counter-strain x 90 sec hold x 3, and cross friction x 2 sets of 2 minutes.    Passive ROM R ankle and knee  PROM x5 minutes            PT Short Term Goals - 04/18/15 2003    PT SHORT TERM GOAL #1   Title Patient to be independent in correctly and consistently performing appropriate HEP, to be updated PRN.    Time 2   Period Weeks   Status New   PT SHORT TERM GOAL #2   Title Patient will report decreased max R posteromedial knee pain to 6/10 on a VAS in order to improve tolerance with standing/walking activities for >35 minutes.    Time 3   Period Weeks   Status New   PT SHORT TERM GOAL #3   Title Patient will present with less inflammation and tenderness upon palpation of the medial R knee in order to improve tolerance with work related activities.    Time 3   Period Weeks   Status New           PT Long Term Goals - 04/18/15 2007    PT LONG TERM GOAL #1   Title Patient will report decreased R knee pain to 0-5/10 on a VAS in order to improve tolerance with prolonged standing to >45 minutes.    Time 6   Period Weeks   Status New   PT LONG TERM GOAL #2   Title Patient will improve B gastroc-soleus flexibility with B ankle AROM measured to 0-20 degrees in order to reduce stress on the gastroc-soleus muscle during squatting activities.    Time 6   Period Weeks   Status New   PT LONG TERM GOAL #3   Title Patient will improve B LE strength to 5/5 MMT grade in order to improve knee stabilization and reduce stress on the R knee with community ambulation >45 minutes.    Time 6   Period Weeks   Status New   PT LONG TERM GOAL #4   Title Patient's FOTO limitation score will reduce from 55% to 30% in order to progress towards decreased pain levels with leisure activities.    Time 6   Period Weeks   Status New               Plan - 05/11/15 1727    Clinical Impression Statement Session focus on pain control with use of manual therapy techniques to reduce trigger point pain on medial aspect of proximal Rt gastroc.  Noted moderate gastroc tightness, able to reduce with soft  tissue mobilization techniques though unable to fully resolve.  Pt reported pain reduced following manual soft tissue and trigger point techniques.  Continued with functional stretches to improve gastroc flexibilty.  Ended session with iontophoresis on proximal medial gastroc for pain control.  Pt educated on purpose and method with ionto.  Pt will benefit from skilled therapeutic intervention in order to improve on the following deficits Decreased range of motion;Difficulty walking;Increased fascial restricitons;Obesity;Pain;Hypomobility;Impaired flexibility;Improper body mechanics;Decreased strength   Rehab Potential Good   Clinical Impairments Affecting Rehab Potential chronic nature of R knee pain    PT Frequency 2x / week   PT Duration 6 weeks   PT Treatment/Interventions ADLs/Self Care Home Management;Cryotherapy;Electrical Stimulation;Iontophoresis 4mg /ml Dexamethasone;Moist Heat;Therapeutic exercise;Therapeutic activities;Gait training;Ultrasound;Neuromuscular re-education;Patient/family education;Manual techniques;Passive range of motion   PT Next Visit Plan F/U with pain relief following iontophoresis.  Continue with STM/cross friction to distal medial HS and proximal medial gastroc tendon, gastroc/soleus stretches in long sitting and ankle PF strengthening with theraband.        Problem List Patient Active Problem List   Diagnosis Date Noted  . Diverticulosis of colon without hemorrhage   . Hx of adenomatous colonic polyps 12/01/2013  . Pain in joint, shoulder region 06/24/2013  . Muscle weakness (generalized) 06/24/2013  . Incomplete rotator cuff tear   . Diabetes mellitus without complication ([place])   . PONV (postoperative nausea and vomiting)   . Fluid retention   . Hypertension    Ihor Austin, LPTA; [place]   Aldona Lento 05/11/2015, 5:42 PM  [facility] [address] [place], [place], [address] Phone:  [phone]   Fax:  [phone]  Name: Amy Tran MRN: [id number] Date of Birth: [birth year]

## 2015-05-15 DIAGNOSIS — I10 Essential (primary) hypertension: Secondary | ICD-10-CM | POA: Diagnosis not present

## 2015-05-15 DIAGNOSIS — E119 Type 2 diabetes mellitus without complications: Secondary | ICD-10-CM | POA: Diagnosis not present

## 2015-05-16 ENCOUNTER — Ambulatory Visit (HOSPITAL_COMMUNITY): Payer: Medicare Other | Attending: Orthopedic Surgery

## 2015-05-16 DIAGNOSIS — M21869 Other specified acquired deformities of unspecified lower leg: Secondary | ICD-10-CM

## 2015-05-16 DIAGNOSIS — M25561 Pain in right knee: Secondary | ICD-10-CM | POA: Insufficient documentation

## 2015-05-16 DIAGNOSIS — M94261 Chondromalacia, right knee: Secondary | ICD-10-CM | POA: Diagnosis not present

## 2015-05-16 DIAGNOSIS — R269 Unspecified abnormalities of gait and mobility: Secondary | ICD-10-CM | POA: Insufficient documentation

## 2015-05-16 DIAGNOSIS — G729 Myopathy, unspecified: Secondary | ICD-10-CM | POA: Insufficient documentation

## 2015-05-16 DIAGNOSIS — M6281 Muscle weakness (generalized): Secondary | ICD-10-CM | POA: Diagnosis not present

## 2015-05-16 DIAGNOSIS — M25562 Pain in left knee: Secondary | ICD-10-CM | POA: Diagnosis not present

## 2015-05-16 DIAGNOSIS — Z658 Other specified problems related to psychosocial circumstances: Secondary | ICD-10-CM | POA: Diagnosis present

## 2015-05-16 DIAGNOSIS — M79661 Pain in right lower leg: Secondary | ICD-10-CM | POA: Diagnosis not present

## 2015-05-16 DIAGNOSIS — R262 Difficulty in walking, not elsewhere classified: Secondary | ICD-10-CM | POA: Insufficient documentation

## 2015-05-16 DIAGNOSIS — R2689 Other abnormalities of gait and mobility: Secondary | ICD-10-CM | POA: Insufficient documentation

## 2015-05-16 DIAGNOSIS — R29898 Other symptoms and signs involving the musculoskeletal system: Secondary | ICD-10-CM | POA: Insufficient documentation

## 2015-05-16 DIAGNOSIS — Z789 Other specified health status: Secondary | ICD-10-CM

## 2015-05-16 DIAGNOSIS — R6889 Other general symptoms and signs: Secondary | ICD-10-CM

## 2015-05-16 DIAGNOSIS — M6289 Other specified disorders of muscle: Secondary | ICD-10-CM

## 2015-05-16 NOTE — Therapy (Signed)
[place], [place], [address] Phone: [phone]   Fax:  [phone]  Physical Therapy Treatment  Patient Details  Name: Amy Tran MRN: [id number] Date of Birth: [birth year] Referring Provider: Dr. Elsie Saas  Encounter Date: 05/16/2015      PT End of Session - 05/16/15 1700    Visit Number 4   Number of Visits 13   Date for PT Re-Evaluation 05/18/15   Authorization Type [employer] Medicare    Authorization Time Period 04/18/2015 to 05/30/2015   Authorization - Visit Number 4   Authorization - Number of Visits 10   PT Start Time [id number]   PT Stop Time 1710   PT Time Calculation (min) 47 min   Activity Tolerance Patient tolerated treatment well   Behavior During Therapy [facility] for tasks assessed/performed      Past Medical History  Diagnosis Date  . Hypertension   . Fluid retention   . Diabetes mellitus without complication ([place])   . PONV (postoperative nausea and vomiting)   . Wears glasses   . Wears partial dentures     bottom  . Incomplete rotator cuff tear   . Cancer [facility])     multiple skin cancers    Past Surgical History  Procedure Laterality Date  . Gastric bypass  2004  . Carpal tunnel release  2001    left  . Cholecystectomy    . Neck surgery  1998  . Shoulder surgery  1999    left  . Ercp  2002  . Colonoscopy  2007    [id number] internal hemorrhoids. Diminutive rectal polyp at 10 cm, cold biopsied/removed. The remainder of the rectal mucosa appeared normal Swallow left-sided diverticula. Diminutive polyp at the splenic flexure cold biopsied/removed (adenomatous)  . Appendectomy    . Shoulder arthroscopy with subacromial decompression Left 06/23/2013    Procedure: LEFT SHOULDER ARTHROSCOPY WITH DEBRIDEMENT ROTATOR CUFF AND LABRUM;  Surgeon: Lorn Junes, MD;  Location: [place];  Service: Orthopedics;  Laterality: Left;  . Colonoscopy N/A 12/28/2013    Procedure:  COLONOSCOPY;  Surgeon: Daneil Dolin, MD;  [facility];  Service: Endoscopy;  Laterality: N/A;  730 - moved to 8:30 - Ginger notified pt    There were no vitals filed for this visit.  Visit Diagnosis:  Pain of right knee and lower leg  Muscle tightness  Weakness of right leg  Abnormal gait  Chondromalacia of knee, right  Right knee pain  Difficulty walking  Difficulty navigating stairs  Genu recurvatum, unspecified laterality  Muscle weakness  Arthralgia of left lower leg      Subjective Assessment - 05/16/15 1626    Subjective Pt stated she has increased pain today, has been walking today.  Current pain scale 9/10 really achey.  Reports relief with following ionto   Pertinent History HTN, DM, skin CA, and R knee pain    Patient Stated Goals Reduce her R knee pain    Currently in Pain? Yes   Pain Score 9    Pain Location Knee   Pain Orientation Right                         [facility] Adult PT Treatment/Exercise - 05/16/15 0001    Knee/Hip Exercises: Stretches   Active Hamstring Stretch 3 reps;30 seconds   Active Hamstring Stretch Limitations supine with rope   Gastroc Stretch 4 reps;30 seconds;Right  Gastroc Stretch Limitations with rope   Knee/Hip Exercises: Seated   Other Seated Knee/Hip Exercises GTB for plantarflexion 15x   Modalities   Modalities Iontophoresis   Iontophoresis   Type of Iontophoresis Dexamethasone   Location Rt medial distal hamstrings and proximal medial gastroc   Dose 4.0   Time 12   Manual Therapy   Manual Therapy Soft tissue mobilization;Passive ROM   Soft tissue mobilization STM to R calf/medial head of gastroc in prone position: Techniques included palmar spreading x 5 minutes, active release x 4 minutes, strain-counter-strain x 90 sec hold x 3, and cross friction x 2 sets of 2 minutes.    Passive ROM R ankle and knee PROM x5 minutes                 PT Education - 05/16/15 1730    Education  provided Yes   Education Details Benefits of compression hose for edema control, explained puropse of ionto for pain control and edcuated on removal time and checking skin integrity   Person(s) Educated Patient   Methods Explanation;Demonstration;Handout   Comprehension Verbalized understanding;Returned demonstration          PT Short Term Goals - 04/18/15 2003    PT SHORT TERM GOAL #1   Title Patient to be independent in correctly and consistently performing appropriate HEP, to be updated PRN.    Time 2   Period Weeks   Status New   PT SHORT TERM GOAL #2   Title Patient will report decreased max R posteromedial knee pain to 6/10 on a VAS in order to improve tolerance with standing/walking activities for >35 minutes.    Time 3   Period Weeks   Status New   PT SHORT TERM GOAL #3   Title Patient will present with less inflammation and tenderness upon palpation of the medial R knee in order to improve tolerance with work related activities.    Time 3   Period Weeks   Status New           PT Long Term Goals - 04/18/15 2007    PT LONG TERM GOAL #1   Title Patient will report decreased R knee pain to 0-5/10 on a VAS in order to improve tolerance with prolonged standing to >45 minutes.    Time 6   Period Weeks   Status New   PT LONG TERM GOAL #2   Title Patient will improve B gastroc-soleus flexibility with B ankle AROM measured to 0-20 degrees in order to reduce stress on the gastroc-soleus muscle during squatting activities.    Time 6   Period Weeks   Status New   PT LONG TERM GOAL #3   Title Patient will improve B LE strength to 5/5 MMT grade in order to improve knee stabilization and reduce stress on the R knee with community ambulation >45 minutes.    Time 6   Period Weeks   Status New   PT LONG TERM GOAL #4   Title Patient's FOTO limitation score will reduce from 55% to 30% in order to progress towards decreased pain levels with leisure activities.    Time 6   Period  Weeks   Status New               Plan - 05/16/15 1701    Clinical Impression Statement Continued session focus on pain control with use of manual therapy techniques to reduce trigger point and scar tissue adhesions on medial aspect of proximal  gastrocnemius.  Noted gastrocnemius reduced tightness this session following soft tissue mobilization techniques.  Added gastroc strengthening with green theraband with no reports of increased pain.  Ended session with iontophoresis, reviewed purpose of modality and time to leave on leg following treatment.  End of sessoin pt reports pain reduced to 4/10 folowng manual, stretches and ionto.  Pt stated she went to [facility] yesterday and was given prescription for compression hose to assist with edema control BLE, pt educated on benefits of compression hose for edema control, was given handout to facility in [place] that carries hose.     Pt will benefit from skilled therapeutic intervention in order to improve on the following deficits Decreased range of motion;Difficulty walking;Increased fascial restricitons;Obesity;Pain;Hypomobility;Impaired flexibility;Improper body mechanics;Decreased strength   Rehab Potential Good   Clinical Impairments Affecting Rehab Potential chronic nature of R knee pain    PT Frequency 2x / week   PT Duration 6 weeks   PT Treatment/Interventions ADLs/Self Care Home Management;Cryotherapy;Electrical Stimulation;Iontophoresis 4mg /ml Dexamethasone;Moist Heat;Therapeutic exercise;Therapeutic activities;Gait training;Ultrasound;Neuromuscular re-education;Patient/family education;Manual techniques;Passive range of motion   PT Next Visit Plan F/U with pain relief following iontophoresis.  Continue with STM/cross friction to distal medial HS and proximal medial gastroc tendon, gastroc/soleus stretches in long sitting and ankle PF strengthening with theraband.        Problem List Patient Active Problem List   Diagnosis Date Noted   . Diverticulosis of colon without hemorrhage   . Hx of adenomatous colonic polyps 12/01/2013  . Pain in joint, shoulder region 06/24/2013  . Muscle weakness (generalized) 06/24/2013  . Incomplete rotator cuff tear   . Diabetes mellitus without complication ([place])   . PONV (postoperative nausea and vomiting)   . Fluid retention   . Hypertension    Ihor Austin, [place]; [place]  Aldona Lento 05/16/2015, 5:33 PM  [facility] [address] [place], [place], [address] Phone: [phone]   Fax:  [phone]  Name: Amy Tran MRN: [id number] Date of Birth: 06-15-1955

## 2015-05-18 ENCOUNTER — Ambulatory Visit (HOSPITAL_COMMUNITY): Payer: Medicare Other

## 2015-05-18 ENCOUNTER — Encounter (HOSPITAL_COMMUNITY): Payer: Self-pay

## 2015-05-18 DIAGNOSIS — M25561 Pain in right knee: Secondary | ICD-10-CM

## 2015-05-18 DIAGNOSIS — R29898 Other symptoms and signs involving the musculoskeletal system: Secondary | ICD-10-CM | POA: Diagnosis not present

## 2015-05-18 DIAGNOSIS — M6281 Muscle weakness (generalized): Secondary | ICD-10-CM

## 2015-05-18 DIAGNOSIS — M79661 Pain in right lower leg: Secondary | ICD-10-CM | POA: Diagnosis not present

## 2015-05-18 DIAGNOSIS — R269 Unspecified abnormalities of gait and mobility: Secondary | ICD-10-CM | POA: Diagnosis not present

## 2015-05-18 DIAGNOSIS — M25562 Pain in left knee: Secondary | ICD-10-CM | POA: Diagnosis not present

## 2015-05-18 DIAGNOSIS — R2689 Other abnormalities of gait and mobility: Secondary | ICD-10-CM

## 2015-05-18 DIAGNOSIS — G729 Myopathy, unspecified: Secondary | ICD-10-CM | POA: Diagnosis not present

## 2015-05-18 DIAGNOSIS — R262 Difficulty in walking, not elsewhere classified: Secondary | ICD-10-CM | POA: Diagnosis not present

## 2015-05-18 DIAGNOSIS — M21869 Other specified acquired deformities of unspecified lower leg: Secondary | ICD-10-CM | POA: Diagnosis not present

## 2015-05-18 DIAGNOSIS — M94261 Chondromalacia, right knee: Secondary | ICD-10-CM | POA: Diagnosis not present

## 2015-05-18 NOTE — Therapy (Signed)
[place], [place], [address] Phone: [phone]   Fax:  [phone]  Physical Therapy Treatment/ Reassessment/ Recertification  Patient Details  Name: Amy Tran MRN: [id number] Date of Birth: October 10, 1955 Referring Provider: Dr. Elsie Saas   Encounter Date: 05/18/2015      PT End of Session - 05/18/15 1732    Visit Number 5   Number of Visits 15   Date for PT Re-Evaluation 05/30/15   Authorization Type UHC Medicare    Authorization Time Period 04/18/2015 to 3/71/0626 ( Recert completed on 10/15/8544 for 2x/week for 5 weeks)  New cert EVOJJK=0/10/3816 to 06/22/2015   Authorization - Visit Number 5   Authorization - Number of Visits 10   PT Start Time 2993   PT Stop Time 1817   PT Time Calculation (min) 45 min   Activity Tolerance Patient tolerated treatment well   Behavior During Therapy [facility] for tasks assessed/performed      Past Medical History  Diagnosis Date  . Hypertension   . Fluid retention   . Diabetes mellitus without complication ([place])   . PONV (postoperative nausea and vomiting)   . Wears glasses   . Wears partial dentures     bottom  . Incomplete rotator cuff tear   . Cancer [facility])     multiple skin cancers    Past Surgical History  Procedure Laterality Date  . Gastric bypass  2004  . Carpal tunnel release  2001    left  . Cholecystectomy    . Neck surgery  1998  . Shoulder surgery  1999    left  . Ercp  2002  . Colonoscopy  2007    ZJI:RCVELFY internal hemorrhoids. Diminutive rectal polyp at 10 cm, cold biopsied/removed. The remainder of the rectal mucosa appeared normal Swallow left-sided diverticula. Diminutive polyp at the splenic flexure cold biopsied/removed (adenomatous)  . Appendectomy    . Shoulder arthroscopy with subacromial decompression Left 06/23/2013    Procedure: LEFT SHOULDER ARTHROSCOPY WITH DEBRIDEMENT ROTATOR CUFF AND LABRUM;  Surgeon: Lorn Junes, MD;   Location: [place];  Service: Orthopedics;  Laterality: Left;  . Colonoscopy N/A 12/28/2013    Procedure: COLONOSCOPY;  Surgeon: Daneil Dolin, MD;  [facility];  Service: Endoscopy;  Laterality: N/A;  730 - moved to 8:30 - Ginger notified pt    There were no vitals filed for this visit.      Subjective Assessment - 05/18/15 1737    Subjective Pt reports that PT is helping, but that she only remains pain-free for 12-24 hours after she leaves PT. R medial knee pain was rated a 2/10 on a VAS upon arrival. Pt noted that the tenderness in her R calf "is a whole lot better" and that her main complaint is her R medial knee pain that is located over the medial joint line. She cancelled her appt with Dr. Noemi Chapel, which was scheduled earlier this week, due to only attending a few PT visits since her eval. Pt plans to call and reschedule her appt with Dr. Noemi Chapel within the next few weeks.    Pertinent History HTN, DM, skin CA, and R knee pain    Limitations Standing;Walking;Other (comment);House hold activities   How long can you sit comfortably? No limit   How long can you stand comfortably? increased R knee pain >60 minutes    How long can you walk comfortably? increased R knee pain >60 minutes  Patient Stated Goals Reduce her R knee pain    Currently in Pain? Yes   Pain Score 2   2-9/10 on a VAS    Pain Location Knee  medial joint line    Pain Orientation Medial;Right   Pain Descriptors / Indicators Aching   Pain Type Chronic pain   Pain Onset More than a month ago   Pain Frequency Intermittent   Aggravating Factors  Standing prolonged periods of time and walking long distances    Pain Relieving Factors massage and stretches    Effect of Pain on Daily Activities Does not self limit             [facility] PT Assessment - 05/18/15 0001    Assessment   Medical Diagnosis R posteromedial knee pain    Referring Provider Dr. Elsie Saas    Onset Date/Surgical  Date 04/04/15   Hand Dominance Right   Next MD Visit Unknown    Precautions   Precautions None   Restrictions   Weight Bearing Restrictions No   Prior Function   Level of Independence Independent   Vocation Part time employment   Leisure works at [employer]; has to do 6 hour shifts 3 x/day , needs to be up on her feet    Observation/Other Assessments   Focus on Therapeutic Outcomes (FOTO)  47% limitation    Sensation   Light Touch Appears Intact   Posture/Postural Control   Posture Comments bilateral genu valgus; B feet pronation   AROM   Right Knee Extension 0  -6 degrees into hyperextension    Right Knee Flexion 129   Left Knee Extension 0  -6 degrees into hyperextension    Left Knee Flexion 130   Right Ankle Dorsiflexion 14   Right Ankle Plantar Flexion 54   Left Ankle Dorsiflexion 15   Left Ankle Plantar Flexion 55   Strength   Right Hip Flexion 4+/5   Right Hip Extension 4+/5   Right Hip ABduction 4+/5   Left Hip Flexion 4+/5   Left Hip Extension 4+/5   Left Hip ABduction 4+/5   Right Knee Flexion 5/5   Right Knee Extension 5/5   Left Knee Flexion 5/5   Left Knee Extension 5/5   Right Ankle Dorsiflexion 5/5   Right Ankle Plantar Flexion 4/5   Right Ankle Inversion 5/5   Right Ankle Eversion 5/5   Left Ankle Dorsiflexion 5/5   Left Ankle Plantar Flexion 4/5   Left Ankle Inversion 5/5   Left Ankle Eversion 5/5   Flexibility   Soft Tissue Assessment /Muscle Length yes  Minor gastroc-soleus tightness, bilateral    Hamstrings NT   Quadriceps NT   Palpation   Patella mobility Minor limitations assessed with R patellar mobility in S<>I direction; M<>L patellar mobility=WNL    Palpation comment moderate tenderness to palpation of the medial joint line of the R knee and R distal semitendinsous/ semimembranosous. Minor tenderness to palpation of  R proximal insertion of the medial head of the gastroc muscle; No tenderness of the lateral joint line of the R knee or  muscle belly of the R gastroc   Special Tests    Special Tests Knee Special Tests   Knee Special tests  other   other    Findings Negative   Comments Ligamentus and meniscal test completed with no pain or hypermobility palpated    Ambulation/Gait   Ambulation/Gait Yes   Ambulation/Gait Assistance 7: Independent   Ambulation Distance (Feet) 100  Feet   Assistive device None   Gait Pattern Antalgic                     [facility] Adult PT Treatment/Exercise - 05/18/15 0001    Knee/Hip Exercises: Stretches   Active Hamstring Stretch 4 reps;30 seconds;Right   Active Hamstring Stretch Limitations supine with rope   Gastroc Stretch 4 reps;30 seconds;Right   Gastroc Stretch Limitations with rope   Knee/Hip Exercises: Standing   Heel Raises Both;20 reps   Heel Raises Limitations with UE support    Other Standing Knee Exercises R ankle PF with blue thera-band x 2 sets of 15 reps   Ultrasound   Ultrasound Location R medial gastroc head and R medial joint line; Skin was WNL prior to and after tx   Ultrasound Parameters 0.5 W/cm2; 4 minutes to each location; prone position; pulsed   Ultrasound Goals Pain;Other (Comment)  reduce inflammation   Iontophoresis   Time Held this visit    Manual Therapy   Manual Therapy Soft tissue mobilization;Passive ROM   Soft tissue mobilization STM to R calf/medial head of gastroc in prone position: Techniques included palmar spreading x 6 minutes, active release x 4 minutes, strain-counter-strain x 90 sec hold x 3, and cross friction x 2 sets of 2 minutes.    Passive ROM R ankle and knee PROM x 2 minutes                 PT Education - 05/18/15 1754    Education provided Yes   Education Details HEP and use of cryotherapy to reduce inflammation    Person(s) Educated Patient   Methods Explanation;Demonstration   Comprehension Verbalized understanding;Returned demonstration          PT Short Term Goals - 05/18/15 1755    PT SHORT TERM  GOAL #1   Title Patient to be independent in correctly and consistently performing appropriate HEP, to be updated PRN.    Time 2   Period Weeks   Status Achieved   PT SHORT TERM GOAL #2   Title Patient will report decreased max R posteromedial knee pain to 6/10 on a VAS in order to improve tolerance with standing/walking activities for >35 minutes.    Time 3   Period Weeks   Status Partially Met   PT SHORT TERM GOAL #3   Title Patient will present with less inflammation and tenderness upon palpation of the medial R knee in order to improve tolerance with work related activities.    Baseline Minor tenderness palpated over medial joint line and medial head of the gastroc   Time 3   Period Weeks   Status Achieved           PT Long Term Goals - 05/18/15 1757    PT LONG TERM GOAL #1   Title Patient will report decreased R knee pain to 0-5/10 on a VAS in order to improve tolerance with prolonged standing to >45 minutes.    Baseline R knee pain has ranged between a 2-8/10 on a VAS   Time 6   Period Weeks   Status On-going   PT LONG TERM GOAL #2   Title Patient will improve B gastroc-soleus flexibility with B ankle AROM measured to 0-20 degrees in order to reduce stress on the gastroc-soleus muscle during squatting activities.    Baseline R ankle DF= 0-14 deg    Time 6   Period Weeks   Status On-going   PT  LONG TERM GOAL #3   Title Patient will improve B LE strength to 5/5 MMT grade in order to improve knee stabilization and reduce stress on the R knee with community ambulation >45 minutes.    Baseline B gastroc-soleus strength improved to 4/5 MMT    Time 6   Period Weeks   Status On-going   PT LONG TERM GOAL #4   Title Patient's FOTO limitation score will reduce from 55% to 30% in order to progress towards decreased pain levels with leisure activities.    Baseline 47% limitation as of 05/18/15   Time 6   Period Weeks   Status On-going               Plan - 05/18/15 1736     Clinical Impression Statement PT tx was focused on STM/MFR to the R gastroc-soleus, gastroc-soleus strengthening ther ex, and use of therapeutic ultrasound (TUS) to reduce inflammation and pain. Added heel raises and resisted R ankle PF with thera-band with no pain provoked. Palpable tenderness was assessed over the medial joint line of the knee and over the medial head of the R gastroc muscle. Minor redness assessed over the medial aspect of the R lower leg where Iontophoresis tx was completed at her last PT visit, which was most likely attributed to the adhesive on the iontophoresis pad. Instructed the pt to inspect the area of redness daily and to notify therapist if redness worsens. Pt verbalized full understanding and agreement. TUS was completed to intact skin of the medial head of the gastroc muscle in order to reduce inflammation. Pt tolerated TUS well with skin WNL prior to and after tx.  Updated HEP with addition of bilateral resisted ankle DF with blue thera-band. Pt was able to demo full understanding with updated HEP. Good tolerance reported and assessed with today's PT tx with pain reduced from a 2/10 to a 0/10 on a VAS.  Mrs. Scheier is a 60 yo female who has been seen for a total of 5 PT visits with initial complaints of medial R knee pain and calf pain. The pt has progressed towards stated goals with improved R gastroc-soleus flexibility/strength, less palpable tenderness of the medial head of the R gastroc muscle, and less severe pain levels. However, the pt continues to c/o severe pain levels at times. Her pain fluctuates from day to day. The pt would benefit from skilled PT for 2x/week for 5 week with current POC s to address current impairments including R medial knee pain, gastroc-soleus weakness, and gastroc-soleus flexibility. The pt is in agreement with continued skilled PT.    Rehab Potential Good   Clinical Impairments Affecting Rehab Potential chronic nature of R knee pain    PT  Frequency 2x / week   PT Duration --  5 weeks   PT Treatment/Interventions ADLs/Self Care Home Management;Cryotherapy;Electrical Stimulation;Iontophoresis 52m/ml Dexamethasone;Moist Heat;Therapeutic exercise;Therapeutic activities;Gait training;Ultrasound;Neuromuscular re-education;Patient/family education;Manual techniques;Passive range of motion   PT Next Visit Plan HOLD iontophoresis tx at next PT visit. Continue with STM/cross friction to distal medial HS and proximal medial gastroc tendon, gastroc/soleus stretches in standing, heel raises, and ankle PF strengthening with theraband.   PT Home Exercise Plan Updated HEP with addition of bilateral resisted ankle DF with blue thera-band.    Consulted and Agree with Plan of Care Patient      Patient will benefit from skilled therapeutic intervention in order to improve the following deficits and impairments:  Decreased range of motion, Difficulty walking, Increased fascial restricitons,  Obesity, Pain, Hypomobility, Impaired flexibility, Improper body mechanics, Decreased strength  Visit Diagnosis: Pain in right knee - Plan: PT plan of care cert/re-cert  Muscle weakness (generalized) - Plan: PT plan of care cert/re-cert  Other abnormalities of gait and mobility - Plan: PT plan of care cert/re-cert       G-Codes - 2015/05/31 2017-04-19    Functional Assessment Tool Used FOTO limitation= 47%   Functional Limitation Mobility: Walking and moving around   Mobility: Walking and Moving Around Current Status [phone]) At least 40 percent but less than 60 percent impaired, limited or restricted   Mobility: Walking and Moving Around Goal Status [phone]) At least 20 percent but less than 40 percent impaired, limited or restricted      Problem List Patient Active Problem List   Diagnosis Date Noted  . Diverticulosis of colon without hemorrhage   . Hx of adenomatous colonic polyps 12/01/2013  . Pain in joint, shoulder region 06/24/2013  . Muscle weakness  (generalized) 06/24/2013  . Incomplete rotator cuff tear   . Diabetes mellitus without complication ([place])   . PONV (postoperative nausea and vomiting)   . Fluid retention   . Hypertension     Garen Lah, PT, DPT  05/31/2015, 8:29 PM  [place] [address] [place], [place], [address] Phone: [phone]   Fax:  [phone]  Name: Amy Tran MRN: [id number] Date of Birth: Jul 02, 1955

## 2015-05-22 ENCOUNTER — Ambulatory Visit (HOSPITAL_COMMUNITY): Payer: Medicare Other | Admitting: Physical Therapy

## 2015-05-22 DIAGNOSIS — M6281 Muscle weakness (generalized): Secondary | ICD-10-CM | POA: Diagnosis not present

## 2015-05-22 DIAGNOSIS — M25562 Pain in left knee: Secondary | ICD-10-CM | POA: Diagnosis not present

## 2015-05-22 DIAGNOSIS — R2689 Other abnormalities of gait and mobility: Secondary | ICD-10-CM | POA: Diagnosis not present

## 2015-05-22 DIAGNOSIS — M21869 Other specified acquired deformities of unspecified lower leg: Secondary | ICD-10-CM | POA: Diagnosis not present

## 2015-05-22 DIAGNOSIS — M25561 Pain in right knee: Secondary | ICD-10-CM

## 2015-05-22 DIAGNOSIS — M79661 Pain in right lower leg: Secondary | ICD-10-CM | POA: Diagnosis not present

## 2015-05-22 DIAGNOSIS — G729 Myopathy, unspecified: Secondary | ICD-10-CM | POA: Diagnosis not present

## 2015-05-22 DIAGNOSIS — R29898 Other symptoms and signs involving the musculoskeletal system: Secondary | ICD-10-CM | POA: Diagnosis not present

## 2015-05-22 DIAGNOSIS — R269 Unspecified abnormalities of gait and mobility: Secondary | ICD-10-CM | POA: Diagnosis not present

## 2015-05-22 DIAGNOSIS — M94261 Chondromalacia, right knee: Secondary | ICD-10-CM | POA: Diagnosis not present

## 2015-05-22 DIAGNOSIS — R262 Difficulty in walking, not elsewhere classified: Secondary | ICD-10-CM | POA: Diagnosis not present

## 2015-05-22 NOTE — Therapy (Signed)
[place], [place], [address] Phone: [phone]   Fax:  [phone]  Physical Therapy Treatment  Patient Details  Name: Amy Tran MRN: [id number] Date of Birth: February 13, 1955 Referring Provider: Dr. Elsie Saas   Encounter Date: 05/22/2015      PT End of Session - 05/22/15 1822    Visit Number 6   Number of Visits 15   Date for PT Re-Evaluation 05/30/15   Authorization Type UHC Medicare    Authorization Time Period 04/18/2015 to 8/59/2924 ( Recert completed on 05/17/2861 for 2x/week for 5 weeks)  New cert OTRRNH=07/16/7901 to 06/22/2015   Authorization - Visit Number 6   Authorization - Number of Visits 10   PT Start Time 8333   PT Stop Time 1815   PT Time Calculation (min) 43 min   Activity Tolerance Patient tolerated treatment well   Behavior During Therapy [facility] for tasks assessed/performed      Past Medical History  Diagnosis Date  . Hypertension   . Fluid retention   . Diabetes mellitus without complication ([place])   . PONV (postoperative nausea and vomiting)   . Wears glasses   . Wears partial dentures     bottom  . Incomplete rotator cuff tear   . Cancer [facility])     multiple skin cancers    Past Surgical History  Procedure Laterality Date  . Gastric bypass  2004  . Carpal tunnel release  2001    left  . Cholecystectomy    . Neck surgery  1998  . Shoulder surgery  1999    left  . Ercp  2002  . Colonoscopy  2007    OVA:NVBTYOM internal hemorrhoids. Diminutive rectal polyp at 10 cm, cold biopsied/removed. The remainder of the rectal mucosa appeared normal Swallow left-sided diverticula. Diminutive polyp at the splenic flexure cold biopsied/removed (adenomatous)  . Appendectomy    . Shoulder arthroscopy with subacromial decompression Left 06/23/2013    Procedure: LEFT SHOULDER ARTHROSCOPY WITH DEBRIDEMENT ROTATOR CUFF AND LABRUM;  Surgeon: Lorn Junes, MD;  Location: [place];  Service: Orthopedics;  Laterality: Left;  . Colonoscopy N/A 12/28/2013    Procedure: COLONOSCOPY;  Surgeon: Daneil Dolin, MD;  [facility];  Service: Endoscopy;  Laterality: N/A;  730 - moved to 8:30 - Ginger notified pt    There were no vitals filed for this visit.      Subjective Assessment - 05/22/15 1734    Subjective Pt notes no change in her symptoms since her last session. She rates her medial knee pain a 3 or 4/10 VAS. She is performing her HEP regularly without much difficulty.   Pertinent History HTN, DM, skin CA, and R knee pain    Limitations Standing;Walking;Other (comment);House hold activities   Currently in Pain? Yes   Pain Score 4    Pain Location Knee   Pain Orientation Medial;Right   Pain Descriptors / Indicators Aching   Pain Type Chronic pain   Pain Radiating Towards none   Pain Onset More than a month ago   Pain Frequency Intermittent   Aggravating Factors  standing prolonged periods of time, walking long distances   Pain Relieving Factors stretches and massage   Effect of Pain on Daily Activities continues to push through the pain.   Multiple Pain Sites No  [place] Adult PT Treatment/Exercise - 05/22/15 0001    Knee/Hip Exercises: Stretches   Active Hamstring Stretch 4 reps;30 seconds;Right   Active Hamstring Stretch Limitations supine with rope   Gastroc Stretch 4 reps;30 seconds   Gastroc Stretch Limitations against wall   Knee/Hip Exercises: Standing   Other Standing Knee Exercises standing toe raises with 1 UE support   Knee/Hip Exercises: Supine   Bridges Strengthening;Both;2 sets;10 reps   Knee/Hip Exercises: Sidelying   Clams 2x10 reps each with green TB   Manual Therapy   Manual Therapy Soft tissue mobilization;Passive ROM   Soft tissue mobilization STM to R calf/medial head of gastroc and medial hamstring in prone palmar spreading, active release x 4 minutes, and cross friction x2  minutes.    Passive ROM R ankle PROM x3 min                PT Education - 05/22/15 1821    Education provided Yes   Education Details discussed compression garments for improved comfort and control of swelling; reviewed/updated HEP   Person(s) Educated Patient   Methods Explanation;Demonstration   Comprehension Verbalized understanding;Returned demonstration          PT Short Term Goals - 05/18/15 1755    PT SHORT TERM GOAL #1   Title Patient to be independent in correctly and consistently performing appropriate HEP, to be updated PRN.    Time 2   Period Weeks   Status Achieved   PT SHORT TERM GOAL #2   Title Patient will report decreased max R posteromedial knee pain to 6/10 on a VAS in order to improve tolerance with standing/walking activities for >35 minutes.    Time 3   Period Weeks   Status Partially Met   PT SHORT TERM GOAL #3   Title Patient will present with less inflammation and tenderness upon palpation of the medial R knee in order to improve tolerance with work related activities.    Baseline Minor tenderness palpated over medial joint line and medial head of the gastroc   Time 3   Period Weeks   Status Achieved           PT Long Term Goals - 05/18/15 1757    PT LONG TERM GOAL #1   Title Patient will report decreased R knee pain to 0-5/10 on a VAS in order to improve tolerance with prolonged standing to >45 minutes.    Baseline R knee pain has ranged between a 2-8/10 on a VAS   Time 6   Period Weeks   Status On-going   PT LONG TERM GOAL #2   Title Patient will improve B gastroc-soleus flexibility with B ankle AROM measured to 0-20 degrees in order to reduce stress on the gastroc-soleus muscle during squatting activities.    Baseline R ankle DF= 0-14 deg    Time 6   Period Weeks   Status On-going   PT LONG TERM GOAL #3   Title Patient will improve B LE strength to 5/5 MMT grade in order to improve knee stabilization and reduce stress on the R  knee with community ambulation >45 minutes.    Baseline B gastroc-soleus strength improved to 4/5 MMT    Time 6   Period Weeks   Status On-going   PT LONG TERM GOAL #4   Title Patient's FOTO limitation score will reduce from 55% to 30% in order to progress towards decreased pain levels with leisure activities.    Baseline 47% limitation as  of 05/18/15   Time 6   Period Weeks   Status On-going               Plan - 05/22/15 1823    Clinical Impression Statement Today's session focused on manual techniques to improve soft tissue restrictions as well as therex targeting BLE strength. Pt with good tolerance to activity evident by no increase in pain report by the end of today's session. She was able to perform hip strengthening with reported muscle burning secondary to weakness. Will continue with current POC.   Rehab Potential Good   Clinical Impairments Affecting Rehab Potential chronic nature of R knee pain    PT Frequency 2x / week   PT Duration --  5 weeks   PT Treatment/Interventions ADLs/Self Care Home Management;Cryotherapy;Electrical Stimulation;Iontophoresis 74m/ml Dexamethasone;Moist Heat;Therapeutic exercise;Therapeutic activities;Gait training;Ultrasound;Neuromuscular re-education;Patient/family education;Manual techniques;Passive range of motion   PT Next Visit Plan HOLD iontophoresis tx at next PT visit. Continue with STM/cross friction to distal medial HS and proximal medial gastroc tendon, gastroc/soleus stretches in standing, heel raises, and ankle PF strengthening with theraband and hip strengthening    PT Home Exercise Plan Updated HEP with bridge and sidelying clamshells    Consulted and Agree with Plan of Care Patient      Patient will benefit from skilled therapeutic intervention in order to improve the following deficits and impairments:  Decreased range of motion, Difficulty walking, Increased fascial restricitons, Obesity, Pain, Hypomobility, Impaired  flexibility, Improper body mechanics, Decreased strength  Visit Diagnosis: Pain in right knee  Muscle weakness (generalized)  Other abnormalities of gait and mobility  Muscle tightness  Pain of right knee and lower leg  Weakness of right leg  Abnormal gait     Problem List Patient Active Problem List   Diagnosis Date Noted  . Diverticulosis of colon without hemorrhage   . Hx of adenomatous colonic polyps 12/01/2013  . Pain in joint, shoulder region 06/24/2013  . Muscle weakness (generalized) 06/24/2013  . Incomplete rotator cuff tear   . Diabetes mellitus without complication ([place]   . PONV (postoperative nausea and vomiting)   . Fluid retention   . Hypertension    6:29 PM,05/22/2015 SElly ModenaPT, DPT AForestine NaOutpatient Physical Therapy [address]SA[address] [place] [address]Phone: [phone]  Fax:  [phone] Name: DITALIA WOLFERTMRN: [id number] of Birth: 203-Nov-1957

## 2015-05-23 ENCOUNTER — Encounter (HOSPITAL_COMMUNITY): Payer: Medicare Other

## 2015-05-25 ENCOUNTER — Encounter (HOSPITAL_COMMUNITY): Payer: Self-pay

## 2015-05-25 ENCOUNTER — Ambulatory Visit (HOSPITAL_COMMUNITY): Payer: Medicare Other

## 2015-05-25 DIAGNOSIS — R29898 Other symptoms and signs involving the musculoskeletal system: Secondary | ICD-10-CM | POA: Diagnosis not present

## 2015-05-25 DIAGNOSIS — G729 Myopathy, unspecified: Secondary | ICD-10-CM | POA: Diagnosis not present

## 2015-05-25 DIAGNOSIS — M25561 Pain in right knee: Secondary | ICD-10-CM | POA: Diagnosis not present

## 2015-05-25 DIAGNOSIS — M6281 Muscle weakness (generalized): Secondary | ICD-10-CM | POA: Diagnosis not present

## 2015-05-25 DIAGNOSIS — M94261 Chondromalacia, right knee: Secondary | ICD-10-CM | POA: Diagnosis not present

## 2015-05-25 DIAGNOSIS — M25562 Pain in left knee: Secondary | ICD-10-CM | POA: Diagnosis not present

## 2015-05-25 DIAGNOSIS — R269 Unspecified abnormalities of gait and mobility: Secondary | ICD-10-CM | POA: Diagnosis not present

## 2015-05-25 DIAGNOSIS — R2689 Other abnormalities of gait and mobility: Secondary | ICD-10-CM

## 2015-05-25 DIAGNOSIS — M21869 Other specified acquired deformities of unspecified lower leg: Secondary | ICD-10-CM | POA: Diagnosis not present

## 2015-05-25 DIAGNOSIS — R262 Difficulty in walking, not elsewhere classified: Secondary | ICD-10-CM | POA: Diagnosis not present

## 2015-05-25 DIAGNOSIS — M79661 Pain in right lower leg: Secondary | ICD-10-CM | POA: Diagnosis not present

## 2015-05-25 NOTE — Therapy (Signed)
[place], [place], [address] Phone: [phone]   Fax:  [phone]  Physical Therapy Treatment  Patient Details  Name: Amy Tran MRN: [id number] Date of Birth: 1955-04-28 Referring Provider: Dr. Elsie Saas   Encounter Date: 05/25/2015      PT End of Session - 05/25/15 1753    Visit Number 7   Number of Visits 15   Authorization Type [employer] Medicare    Authorization Time Period 04/18/2015 to 3/54/6568 ( Recert completed on 02/13/7515 for 2x/week for 5 weeks)  New cert GYFVCB=05/15/9673 to 06/22/2015   Authorization - Visit Number 7   Authorization - Number of Visits 10   PT Start Time 9163   PT Stop Time 1810   PT Time Calculation (min) 40 min   Activity Tolerance Patient tolerated treatment well   Behavior During Therapy [facility] for tasks assessed/performed      Past Medical History  Diagnosis Date  . Hypertension   . Fluid retention   . Diabetes mellitus without complication ([place])   . PONV (postoperative nausea and vomiting)   . Wears glasses   . Wears partial dentures     bottom  . Incomplete rotator cuff tear   . Cancer [facility])     multiple skin cancers    Past Surgical History  Procedure Laterality Date  . Gastric bypass  2004  . Carpal tunnel release  2001    left  . Cholecystectomy    . Neck surgery  1998  . Shoulder surgery  1999    left  . Ercp  2002  . Colonoscopy  2007    [id number] internal hemorrhoids. Diminutive rectal polyp at 10 cm, cold biopsied/removed. The remainder of the rectal mucosa appeared normal Swallow left-sided diverticula. Diminutive polyp at the splenic flexure cold biopsied/removed (adenomatous)  . Appendectomy    . Shoulder arthroscopy with subacromial decompression Left 06/23/2013    Procedure: LEFT SHOULDER ARTHROSCOPY WITH DEBRIDEMENT ROTATOR CUFF AND LABRUM;  Surgeon: Lorn Junes, MD;  Location: [facility];  Service: Orthopedics;  Laterality:  Left;  . Colonoscopy N/A 12/28/2013    Procedure: COLONOSCOPY;  Surgeon: Daneil Dolin, MD;  [facility];  Service: Endoscopy;  Laterality: N/A;  730 - moved to 8:30 - Ginger notified pt    There were no vitals filed for this visit.      Subjective Assessment - 05/25/15 1736    Subjective Patient reported that she saw Dr. Noemi Chapel this past Tuesday and was instructed to discontinue outpatient PT at this time. Patient is to return to see Dr. Noemi Chapel in one month for a f/u appt to assess her R knee pain. Patient requested DC from PT. Overall, the patient noted that her R knee pain has slightly improved. She reported a 25% improvement since beginning with PT.    Pertinent History HTN, DM, skin CA, and R knee pain    Limitations Standing;Walking;Other (comment);House hold activities   How long can you sit comfortably? No limit   How long can you stand comfortably? increased R knee pain >60 minutes    How long can you walk comfortably? increased R knee pain >60 minutes    Patient Stated Goals Reduce her R knee pain    Currently in Pain? Yes   Pain Score 3   R medial knee pain ranges between a 3-5/10 on a VAS   Pain Location Knee   Pain Orientation Right;Medial  Pain Descriptors / Indicators Aching   Pain Type Chronic pain   Pain Radiating Towards none   Pain Onset More than a month ago   Pain Frequency Intermittent   Aggravating Factors  standing prolonged periods of time and walking long distances    Pain Relieving Factors stretches and massage   Effect of Pain on Daily Activities Continues to push through the pain             [facility] PT Assessment - 05/25/15 0001    Assessment   Medical Diagnosis R posteromedial knee pain    Referring Provider Dr. Elsie Saas    Onset Date/Surgical Date 04/04/15   Hand Dominance Right   Next MD Visit Jun 22, 2015    Precautions   Precautions None   Restrictions   Weight Bearing Restrictions No   Prior Function   Level of  Independence Independent   Vocation Part time employment   Leisure works at [employer]; has to do 6 hour shifts 3 x/day , needs to be up on her feet    Observation/Other Assessments   Focus on Therapeutic Outcomes (FOTO)  Not assessed this visit    Sensation   Light Touch Appears Intact   Posture/Postural Control   Posture Comments bilateral genu valgus; B feet pronation   AROM   Right Knee Extension 0  -6 degrees into hyperextension    Right Knee Flexion 130   Left Knee Extension 0  -6 degrees into hyperextension    Left Knee Flexion 130   Right Ankle Dorsiflexion 14   Right Ankle Plantar Flexion 54   Left Ankle Dorsiflexion 15   Left Ankle Plantar Flexion 55   Strength   Right Hip Flexion 4+/5   Right Hip Extension 4+/5   Right Hip ABduction 4+/5   Left Hip Flexion 4+/5   Left Hip Extension 4+/5   Left Hip ABduction 4+/5   Right Knee Flexion 5/5   Right Knee Extension 5/5   Left Knee Flexion 5/5   Left Knee Extension 5/5   Right Ankle Dorsiflexion 5/5   Right Ankle Plantar Flexion 4/5   Right Ankle Inversion 5/5   Right Ankle Eversion 5/5   Left Ankle Dorsiflexion 5/5   Left Ankle Plantar Flexion 4/5   Left Ankle Inversion 5/5   Left Ankle Eversion 5/5   Flexibility   Soft Tissue Assessment /Muscle Length yes  Minor gastroc-soleus tightness, bilateral    Hamstrings HS 90/90 (L/R)= -12 deg/ -11 deg    Quadriceps NT   Palpation   Patella mobility Minor limitations assessed with R patellar mobility in S<>I direction; M<>L patellar mobility=WNL    Palpation comment moderate tenderness to palpation of the medial joint line of the R knee and R distal semitendinsous/ semimembranosous. Minor tenderness to palpation of  R proximal insertion of the medial head of the gastroc muscle; No tenderness of the lateral joint line of the R knee or muscle belly of the R gastroc   Ambulation/Gait   Ambulation/Gait Yes   Ambulation/Gait Assistance 7: Independent   Ambulation Distance  (Feet) 226 Feet   Assistive device None   Gait Pattern Within Functional Limits             [facility] Adult PT Treatment/Exercise - 05/25/15 0001    Knee/Hip Exercises: Stretches   Active Hamstring Stretch 4 reps;30 seconds;Right   Active Hamstring Stretch Limitations supine with rope   Quad Stretch Right;3 reps;30 seconds   Gastroc Stretch 4 reps;30  seconds   Gastroc Stretch Limitations against wall   Knee/Hip Exercises: Standing   Heel Raises Both;20 reps   Knee/Hip Exercises: Seated   Long Arc Quad Strengthening;Right;10 reps;2 sets   Long Arc Quad Limitations green thera-band   Knee/Hip Exercises: Supine   Bridges Strengthening;Both;2 sets;10 reps   Bridges Limitations HEP review   Straight Leg Raises Strengthening;Right;1 set;10 reps   Straight Leg Raises Limitations HEP review   Knee/Hip Exercises: Sidelying   Hip ABduction Strengthening;Right;1 set;10 reps   Hip ABduction Limitations HEP review   Knee/Hip Exercises: Prone   Hip Extension Strengthening;Right;1 set;10 reps   Hip Extension Limitations HEP review            PT Education - 05/25/15 1752    Education provided Yes   Education Details Educated patient on updated HEP and pain management strategies   Person(s) Educated Patient   Methods Explanation;Demonstration;Handout   Comprehension Verbalized understanding;Returned demonstration          PT Short Term Goals - 05/25/15 1756    PT SHORT TERM GOAL #1   Title Patient to be independent in correctly and consistently performing appropriate HEP, to be updated PRN.    Time 2   Period Weeks   Status Achieved   PT SHORT TERM GOAL #2   Title Patient will report decreased max R posteromedial knee pain to 6/10 on a VAS in order to improve tolerance with standing/walking activities for >35 minutes.    Time 3   Status Partially Met   PT SHORT TERM GOAL #3   Title Patient will present with less inflammation and tenderness upon palpation of the medial R knee  in order to improve tolerance with work related activities.    Baseline Minor tenderness palpated over medial joint line and medial head of the gastroc   Time 3   Period Weeks   Status Achieved   PT SHORT TERM GOAL #4   Time 3           PT Long Term Goals - 05/25/15 1759    PT LONG TERM GOAL #1   Title Patient will report decreased R knee pain to 0-5/10 on a VAS in order to improve tolerance with prolonged standing to >45 minutes.    Baseline R knee pain has ranged between a 2-6/10 on a VAS   Time 6   Period Weeks   Status Partially Met   PT LONG TERM GOAL #2   Title Patient will improve B gastroc-soleus flexibility with B ankle AROM measured to 0-20 degrees in order to reduce stress on the gastroc-soleus muscle during squatting activities.    Baseline R ankle DF= 0-14 deg    Time 6   Period Weeks   Status Partially Met   PT LONG TERM GOAL #3   Title Patient will improve B LE strength to 5/5 MMT grade in order to improve knee stabilization and reduce stress on the R knee with community ambulation >45 minutes.    Baseline Refer to MMT    Time 6   Period Weeks   Status Partially Met   PT LONG TERM GOAL #4   Title Patient's FOTO limitation score will reduce from 55% to 30% in order to progress towards decreased pain levels with leisure activities.    Baseline Not formally assessed this visit; Last FOTO score = 47% limitation (05/18/2015)   Time 6   Period Weeks   Status Partially Met  Plan - Jun 19, 2015 1754    Clinical Impression Statement Mrs. Kinnaird is a 60 yo female who has been seen for a total of 7 PT visits with initial complaints of medial R knee pain and calf pain. The patient has progressed towards stated goals with improved R gastroc-soleus flexibility/strength, less palpable tenderness of the medial head of the R gastroc muscle, and less severe pain levels. The patient is discharged from outpatient PT at this time per patient and MD request. An  updated HEP was established and provided this visit with good understanding demo by the patient with teach back method. Discussed previously prescribed HEP and pain management strategies with good understanding verbalized by patient. All long term goals were partially met as of today. Refer to assessment for current objective values. Patient tolerated all completed ther ex this visit with pain rated a 2/10 on a VAS at the completion of visit.   Rehab Potential Good   Clinical Impairments Affecting Rehab Potential chronic nature of R knee pain    PT Frequency --  Patient DC from PT per patient and MD request   PT Duration --  Patient DC from PT per patient and MD request   PT Treatment/Interventions ADLs/Self Care Home Management;Cryotherapy;Electrical Stimulation;Iontophoresis 1m/ml Dexamethasone;Moist Heat;Therapeutic exercise;Therapeutic activities;Gait training;Ultrasound;Neuromuscular re-education;Patient/family education;Manual techniques;Passive range of motion   PT Next Visit Plan Patient DC from PT per patient and MD request   PT Home Exercise Plan Updated and reviewed HEP  ( added SLR into flex/abd/ext, bridges, LAQ with TB, HS stretch, and quad stretch)    Consulted and Agree with Plan of Care Patient      Patient will benefit from skilled therapeutic intervention in order to improve the following deficits and impairments:  Decreased range of motion, Difficulty walking, Increased fascial restricitons, Obesity, Pain, Hypomobility, Impaired flexibility, Improper body mechanics, Decreased strength  Visit Diagnosis: Pain in right knee  Muscle weakness (generalized)  Other abnormalities of gait and mobility       G-Codes - 005/08/20171801    Functional Assessment Tool Used Previous FOTO limitation of 47% and clinical findings including MMT, pain levels, and gait    Functional Limitation Mobility: Walking and moving around   Mobility: Walking and Moving Around Goal Status [phone] At  least 20 percent but less than 40 percent impaired, limited or restricted   Mobility: Walking and Moving Around Discharge Status [phone] At least 40 percent but less than 60 percent impaired, limited or restricted      Problem List Patient Active Problem List   Diagnosis Date Noted  . Diverticulosis of colon without hemorrhage   . Hx of adenomatous colonic polyps 12/01/2013  . Pain in joint, shoulder region 06/24/2013  . Muscle weakness (generalized) 06/24/2013  . Incomplete rotator cuff tear   . Diabetes mellitus without complication ([place]   . PONV (postoperative nausea and vomiting)   . Fluid retention   . Hypertension     GGaren Lah PT, DPT     405-09-2015 6:42 PM  [place] [address]Phone: [phone]  Fax:  [phone] Name: DJAIYA MOORADIANMRN: [id number] of Birth: 2October 14, 1957

## 2015-05-26 ENCOUNTER — Encounter (HOSPITAL_COMMUNITY): Payer: Self-pay

## 2015-06-12 DIAGNOSIS — H40003 Preglaucoma, unspecified, bilateral: Secondary | ICD-10-CM | POA: Diagnosis not present

## 2015-06-20 DIAGNOSIS — M1711 Unilateral primary osteoarthritis, right knee: Secondary | ICD-10-CM | POA: Diagnosis not present

## 2015-06-27 DIAGNOSIS — M1711 Unilateral primary osteoarthritis, right knee: Secondary | ICD-10-CM | POA: Diagnosis not present

## 2015-06-27 DIAGNOSIS — S83242A Other tear of medial meniscus, current injury, left knee, initial encounter: Secondary | ICD-10-CM | POA: Diagnosis not present

## 2015-06-28 ENCOUNTER — Other Ambulatory Visit (HOSPITAL_COMMUNITY): Payer: Self-pay | Admitting: Orthopedic Surgery

## 2015-06-28 DIAGNOSIS — S83207A Unspecified tear of unspecified meniscus, current injury, left knee, initial encounter: Secondary | ICD-10-CM

## 2015-07-05 ENCOUNTER — Ambulatory Visit (HOSPITAL_COMMUNITY)
Admission: RE | Admit: 2015-07-05 | Discharge: 2015-07-05 | Disposition: A | Discharge status: Home / Self Care | Payer: Medicare Other | Source: Ambulatory Visit | Attending: Orthopedic Surgery | Admitting: Orthopedic Surgery

## 2015-07-05 DIAGNOSIS — X58XXXA Exposure to other specified factors, initial encounter: Secondary | ICD-10-CM | POA: Diagnosis not present

## 2015-07-05 DIAGNOSIS — S83207A Unspecified tear of unspecified meniscus, current injury, left knee, initial encounter: Secondary | ICD-10-CM

## 2015-07-05 DIAGNOSIS — R600 Localized edema: Secondary | ICD-10-CM | POA: Diagnosis not present

## 2015-07-05 DIAGNOSIS — M659 Synovitis and tenosynovitis, unspecified: Secondary | ICD-10-CM | POA: Insufficient documentation

## 2015-07-05 DIAGNOSIS — S8982XA Other specified injuries of left lower leg, initial encounter: Secondary | ICD-10-CM | POA: Diagnosis not present

## 2015-07-05 DIAGNOSIS — M1712 Unilateral primary osteoarthritis, left knee: Secondary | ICD-10-CM | POA: Insufficient documentation

## 2015-07-05 DIAGNOSIS — M25562 Pain in left knee: Secondary | ICD-10-CM | POA: Diagnosis not present

## 2015-07-11 DIAGNOSIS — M170 Bilateral primary osteoarthritis of knee: Secondary | ICD-10-CM | POA: Diagnosis not present

## 2015-07-18 DIAGNOSIS — M1712 Unilateral primary osteoarthritis, left knee: Secondary | ICD-10-CM | POA: Diagnosis not present

## 2015-07-25 DIAGNOSIS — M170 Bilateral primary osteoarthritis of knee: Secondary | ICD-10-CM | POA: Diagnosis not present

## 2015-08-11 DIAGNOSIS — I803 Phlebitis and thrombophlebitis of lower extremities, unspecified: Secondary | ICD-10-CM | POA: Diagnosis not present

## 2015-08-17 ENCOUNTER — Ambulatory Visit (INDEPENDENT_AMBULATORY_CARE_PROVIDER_SITE_OTHER): Payer: Medicare Other | Admitting: Otolaryngology

## 2015-08-17 DIAGNOSIS — K219 Gastro-esophageal reflux disease without esophagitis: Secondary | ICD-10-CM | POA: Diagnosis not present

## 2015-08-17 DIAGNOSIS — R490 Dysphonia: Secondary | ICD-10-CM | POA: Diagnosis not present

## 2015-08-29 DIAGNOSIS — M170 Bilateral primary osteoarthritis of knee: Secondary | ICD-10-CM | POA: Diagnosis not present

## 2015-09-06 ENCOUNTER — Encounter: Payer: Self-pay | Admitting: Obstetrics and Gynecology

## 2015-09-06 ENCOUNTER — Ambulatory Visit (INDEPENDENT_AMBULATORY_CARE_PROVIDER_SITE_OTHER): Payer: Medicare Other | Admitting: Obstetrics and Gynecology

## 2015-09-06 DIAGNOSIS — R102 Pelvic and perineal pain: Secondary | ICD-10-CM

## 2015-09-06 DIAGNOSIS — N816 Rectocele: Secondary | ICD-10-CM | POA: Diagnosis not present

## 2015-09-06 NOTE — Progress Notes (Signed)
Patient ID: Amy Tran, female   DOB: 03/17/1955, 60 y.o.   MRN: [id number]   [facility] Visit  @DATE @            Patient name: Amy Tran MRN [id number]  Date of birth: 04/16/1955  CC & HPI:  Amy Tran is a 60 y.o. female presenting today for moderate pelvic  pressure. She denies difficulty defecating or constipation. Pt states she does not have to strain to pass BMs.   She notes that she has chronic bilateral knee pain secondary to arthritis. Pt states she currently has two jobs.   ROS:  Review of Systems  Genitourinary:       +pelvic pressure   Musculoskeletal: Positive for joint pain.    Pertinent History Reviewed:   Reviewed: Significant for DM Medical         Past Medical History:  Diagnosis Date  . Cancer [facility])    multiple skin cancers  . Diabetes mellitus without complication ([place])   . Fluid retention   . Hypertension   . Incomplete rotator cuff tear   . PONV (postoperative nausea and vomiting)   . Wears glasses   . Wears partial dentures    bottom                              Surgical Hx:    Past Surgical History:  Procedure Laterality Date  . APPENDECTOMY    . CARPAL TUNNEL RELEASE  2001   left  . CHOLECYSTECTOMY    . COLONOSCOPY  2007   [id number] internal hemorrhoids. Diminutive rectal polyp at 10 cm, cold biopsied/removed. The remainder of the rectal mucosa appeared normal Swallow left-sided diverticula. Diminutive polyp at the splenic flexure cold biopsied/removed (adenomatous)  . COLONOSCOPY N/A 12/28/2013   Procedure: COLONOSCOPY;  Surgeon: Daneil Dolin, MD;  [facility];  Service: Endoscopy;  Laterality: N/A;  730 - moved to 8:30 - Ginger notified pt  . ERCP  2002  . GASTRIC BYPASS  2004  . NECK SURGERY  1998  . SHOULDER ARTHROSCOPY WITH SUBACROMIAL DECOMPRESSION Left 06/23/2013   Procedure: LEFT SHOULDER ARTHROSCOPY WITH DEBRIDEMENT ROTATOR CUFF AND LABRUM;  Surgeon: Lorn Junes, MD;  Location: [facility];  Service: Orthopedics;  Laterality: Left;  . SHOULDER SURGERY  1999   left   Medications: Reviewed & Updated - see associated section                       Current Outpatient Prescriptions:  .  amLODipine (NORVASC) 5 MG tablet, Take 5 mg by mouth daily., Disp: , Rfl:  .  Canagliflozin (INVOKANA) 100 MG TABS, Take 100 mg by mouth daily., Disp: , Rfl:  .  [employer] MAGNESIUM OXIDE 500 MG TABS, Take 1 tablet by mouth daily., Disp: , Rfl: 3 .  Cyanocobalamin (VITAMIN B12 PO), Take 1 tablet by mouth daily., Disp: , Rfl:  .  losartan (COZAAR) 100 MG tablet, Take 100 mg by mouth daily., Disp: , Rfl:  .  metFORMIN (GLUCOPHAGE) 1000 MG tablet, Take 1,000 mg by mouth 2 (two) times daily with a meal. , Disp: , Rfl:  .  simvastatin (ZOCOR) 20 MG tablet, , Disp: , Rfl:  .  torsemide (DEMADEX) 20 MG tablet, Take 2 tablets by mouth daily., Disp: , Rfl:    Social History: Reviewed -  reports that she quit smoking  about 13 years ago. She has never used smokeless tobacco.  Objective Findings:  Vitals: Blood pressure 120/72, height 5\' 4"  (1.626 m), weight 292 lb (132.5 kg).  Physical Examination: General appearance - alert, well appearing, and in no distress Abdomen - soft, nontender, nondistended, no masses or organomegaly Pelvic -  VULVA: normal appearing vulva with no masses, tenderness or lesions,  VAGINA: normal appearing vagina with normal color and discharge, no lesions,  CERVIX: normal appearing cervix without discharge or lesions, UTERUS: uterus is normal size, shape, consistency and nontender,  ADNEXA: normal adnexa in size, nontender and no masses RECTAL: Moderate rectocele present  Musculoskeletal - no joint tenderness, deformity or swelling Extremities - peripheral pulses normal, no pedal edema, no clubbing or cyanosis Skin - normal coloration and turgor, no rashes, no suspicious skin lesions noted  Discussed with pt weight loss strategies, including [employer], push away  exercises and water aerobics to alleviate chronic knee pain. Also recommended Weight Watchers and portion control using careful measurements. Advised pt to pursue silver sneakers and arthritis classes at the [facility].   Also advised pt to try splinting to prevent straining. Also advised Kegl exercises.   At end of discussion, pt had opportunity to ask questions and has no further questions at this time.   Greater than 50% was spent in counseling and coordination of care with the patient. Total time greater than: 25 minutes    Assessment & Plan:   A:  1. Chronic arthritic knee pain  2. Moderate rectocele present   P:  1. Advised pt to pursue weight loss to alleviate arthritic knee pains  2. Recommended splinting and Kegl exercises to prevent straining 3. Rectocele handout provided  4. F/u in 3 months or prn      By signing my name below, I, Hansel Feinstein, attest that this documentation has been prepared under the direction and in the presence of [name], MD. Electronically Signed: Hansel Feinstein, ED Scribe. 09/06/15. 12:28 PM.  I personally performed the services described in this documentation, which was SCRIBED in my presence. The recorded information has been reviewed and considered accurate. It has been edited as necessary during review. [name], MD

## 2015-09-19 DIAGNOSIS — N816 Rectocele: Secondary | ICD-10-CM | POA: Insufficient documentation

## 2015-09-28 ENCOUNTER — Ambulatory Visit (INDEPENDENT_AMBULATORY_CARE_PROVIDER_SITE_OTHER): Payer: Medicare Other | Admitting: Otolaryngology

## 2015-09-28 DIAGNOSIS — K219 Gastro-esophageal reflux disease without esophagitis: Secondary | ICD-10-CM | POA: Diagnosis not present

## 2015-09-28 DIAGNOSIS — R490 Dysphonia: Secondary | ICD-10-CM

## 2015-10-10 DIAGNOSIS — M170 Bilateral primary osteoarthritis of knee: Secondary | ICD-10-CM | POA: Diagnosis not present

## 2015-11-07 DIAGNOSIS — M1612 Unilateral primary osteoarthritis, left hip: Secondary | ICD-10-CM | POA: Diagnosis not present

## 2015-12-07 ENCOUNTER — Ambulatory Visit: Payer: Medicare Other | Admitting: Obstetrics and Gynecology

## 2015-12-24 ENCOUNTER — Emergency Department (HOSPITAL_COMMUNITY)
Admission: EM | Admit: 2015-12-24 | Discharge: 2015-12-24 | Disposition: A | Discharge status: Home / Self Care | Payer: Medicare Other | Attending: Emergency Medicine | Admitting: Emergency Medicine

## 2015-12-24 ENCOUNTER — Emergency Department (HOSPITAL_COMMUNITY): Payer: Medicare Other

## 2015-12-24 ENCOUNTER — Encounter (HOSPITAL_COMMUNITY): Payer: Self-pay | Admitting: *Deleted

## 2015-12-24 DIAGNOSIS — I10 Essential (primary) hypertension: Secondary | ICD-10-CM | POA: Insufficient documentation

## 2015-12-24 DIAGNOSIS — E119 Type 2 diabetes mellitus without complications: Secondary | ICD-10-CM | POA: Diagnosis not present

## 2015-12-24 DIAGNOSIS — Y929 Unspecified place or not applicable: Secondary | ICD-10-CM | POA: Insufficient documentation

## 2015-12-24 DIAGNOSIS — Z87891 Personal history of nicotine dependence: Secondary | ICD-10-CM | POA: Insufficient documentation

## 2015-12-24 DIAGNOSIS — Y990 Civilian activity done for income or pay: Secondary | ICD-10-CM | POA: Diagnosis not present

## 2015-12-24 DIAGNOSIS — Z7982 Long term (current) use of aspirin: Secondary | ICD-10-CM | POA: Diagnosis not present

## 2015-12-24 DIAGNOSIS — X500XXA Overexertion from strenuous movement or load, initial encounter: Secondary | ICD-10-CM | POA: Insufficient documentation

## 2015-12-24 DIAGNOSIS — Z79899 Other long term (current) drug therapy: Secondary | ICD-10-CM | POA: Diagnosis not present

## 2015-12-24 DIAGNOSIS — R079 Chest pain, unspecified: Secondary | ICD-10-CM | POA: Diagnosis not present

## 2015-12-24 DIAGNOSIS — Z7984 Long term (current) use of oral hypoglycemic drugs: Secondary | ICD-10-CM | POA: Diagnosis not present

## 2015-12-24 DIAGNOSIS — Z85820 Personal history of malignant melanoma of skin: Secondary | ICD-10-CM | POA: Insufficient documentation

## 2015-12-24 DIAGNOSIS — R0789 Other chest pain: Secondary | ICD-10-CM | POA: Insufficient documentation

## 2015-12-24 DIAGNOSIS — Y93F2 Activity, caregiving, lifting: Secondary | ICD-10-CM | POA: Diagnosis not present

## 2015-12-24 DIAGNOSIS — S299XXA Unspecified injury of thorax, initial encounter: Secondary | ICD-10-CM | POA: Diagnosis present

## 2015-12-24 HISTORY — DX: Pure hypercholesterolemia, unspecified: E78.00

## 2015-12-24 LAB — COMPREHENSIVE METABOLIC PANEL
ALK PHOS: 58 U/L (ref 38–126)
ALT: 15 U/L (ref 14–54)
AST: 12 U/L — ABNORMAL LOW (ref 15–41)
Albumin: 3.9 g/dL (ref 3.5–5.0)
Anion gap: 7 (ref 5–15)
BUN: 17 mg/dL (ref 6–20)
CALCIUM: 9.5 mg/dL (ref 8.9–10.3)
CO2: 30 mmol/L (ref 22–32)
CREATININE: 0.78 mg/dL (ref 0.44–1.00)
Chloride: 102 mmol/L (ref 101–111)
GFR calc Af Amer: >60 mL/min (ref >60)
GFR calc non Af Amer: >60 mL/min (ref >60)
Glucose, Bld: 130 mg/dL — ABNORMAL HIGH (ref 65–99)
Potassium: 3.1 mmol/L — ABNORMAL LOW (ref 3.5–5.1)
SODIUM: 139 mmol/L (ref 135–145)
Total Bilirubin: 1 mg/dL (ref 0.3–1.2)
Total Protein: 7.6 g/dL (ref 6.5–8.1)

## 2015-12-24 LAB — CBC
HCT: 43 % (ref 36.0–46.0)
HEMOGLOBIN: 14.1 g/dL (ref 12.0–15.0)
MCH: 28.2 pg (ref 26.0–34.0)
MCHC: 32.8 g/dL (ref 30.0–36.0)
MCV: 86 fL (ref 78.0–100.0)
Platelets: 258 10*3/uL (ref 150–400)
RBC: 5 MIL/uL (ref 3.87–5.11)
RDW: 14.3 % (ref 11.5–15.5)
WBC: 9.1 10*3/uL (ref 4.0–10.5)

## 2015-12-24 LAB — TROPONIN I: Troponin I: <0.03 ng/mL (ref <0.03)

## 2015-12-24 MED ORDER — METHOCARBAMOL 500 MG PO TABS: 500.0000 mg | ORAL_TABLET | Freq: Two times a day (BID) | ORAL | 0 refills | Status: DC

## 2015-12-24 MED ORDER — DICLOFENAC SODIUM 50 MG PO TBEC: 50.0000 mg | DELAYED_RELEASE_TABLET | Freq: Two times a day (BID) | ORAL | 0 refills | Status: DC

## 2015-12-24 MED ORDER — ASPIRIN 81 MG PO CHEW
324.0000 mg | CHEWABLE_TABLET | Freq: Once | ORAL | Status: AC
  Administered 2015-12-24: 324 mg via ORAL
  Filled 2015-12-24: qty 4

## 2015-12-24 NOTE — ED Triage Notes (Signed)
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 Pt reports the pain radiates into bilateral shoulders and into her back. Pt rates the pain at 5/10. Pt reports difficulty with taking a deep breath. Denies weakness, dizziness, n/v.

## 2015-12-24 NOTE — ED Notes (Signed)
Lab at bedside

## 2015-12-24 NOTE — Discharge Instructions (Signed)
See your Physician for recheck next week.  Return if any problems.   °

## 2015-12-24 NOTE — ED Provider Notes (Signed)
[facility] DEPT Provider Note   CSN: [id number] Arrival date & time: 12/24/15  0855     History   Chief Complaint Chief Complaint  Patient presents with  . Chest Pain    HPI Amy Tran is a 60 y.o. female.  The history is provided by the patient. No language interpreter was used.  Chest Pain   The current episode started yesterday. The problem occurs constantly. The problem has not changed since onset.The pain is associated with coughing and exertion. The pain is moderate. The quality of the pain is described as exertional. The pain does not radiate. The symptoms are aggravated by deep breathing. She has tried nothing for the symptoms. The treatment provided moderate relief.  Her family medical history is significant for heart disease.  Procedure history is negative for cardiac catheterization.    Past Medical History:  Diagnosis Date  . Cancer [facility])    multiple skin cancers  . Diabetes mellitus without complication ([place])   . Fluid retention   . High cholesterol   . Hypertension   . Incomplete rotator cuff tear   . PONV (postoperative nausea and vomiting)   . Wears glasses   . Wears partial dentures    bottom    Patient Active Problem List   Diagnosis Date Noted  . Rectocele 09/19/2015  . Diverticulosis of colon without hemorrhage   . Hx of adenomatous colonic polyps 12/01/2013  . Pain in joint, shoulder region 06/24/2013  . Muscle weakness (generalized) 06/24/2013  . Incomplete rotator cuff tear   . Diabetes mellitus without complication ([place])   . PONV (postoperative nausea and vomiting)   . Fluid retention   . Hypertension     Past Surgical History:  Procedure Laterality Date  . APPENDECTOMY    . CARPAL TUNNEL RELEASE  2001   left  . CHOLECYSTECTOMY    . COLONOSCOPY  2007   [id number] internal hemorrhoids. Diminutive rectal polyp at 10 cm, cold biopsied/removed. The remainder of the rectal mucosa appeared normal Swallow left-sided  diverticula. Diminutive polyp at the splenic flexure cold biopsied/removed (adenomatous)  . COLONOSCOPY N/A 12/28/2013   Procedure: COLONOSCOPY;  Surgeon: Daneil Dolin, MD;  [facility];  Service: Endoscopy;  Laterality: N/A;  730 - moved to 8:30 - Ginger notified pt  . ERCP  2002  . GASTRIC BYPASS  2004  . NECK SURGERY  1998  . SHOULDER ARTHROSCOPY WITH SUBACROMIAL DECOMPRESSION Left 06/23/2013   Procedure: LEFT SHOULDER ARTHROSCOPY WITH DEBRIDEMENT ROTATOR CUFF AND LABRUM;  Surgeon: Lorn Junes, MD;  Location: [facility];  Service: Orthopedics;  Laterality: Left;  . SHOULDER SURGERY  1999   left    OB History    No data available       Home Medications    Prior to Admission medications   Medication Sig Start Date End Date Taking? Authorizing Provider  amLODipine (NORVASC) 5 MG tablet Take 5 mg by mouth daily.   Yes Historical Provider, MD  aspirin EC 81 MG tablet Take 81 mg by mouth daily.   Yes Historical Provider, MD  Canagliflozin (INVOKANA) 100 MG TABS Take 100 mg by mouth daily.   Yes Historical Provider, MD  [employer] MAGNESIUM OXIDE 500 MG TABS Take 1 tablet by mouth daily. 05/26/14  Yes Historical Provider, MD  Cyanocobalamin (VITAMIN B12 PO) Take 1 tablet by mouth daily.   Yes Historical Provider, MD  losartan (COZAAR) 100 MG tablet Take 100 mg by mouth daily.  Yes Historical Provider, MD  metFORMIN (GLUCOPHAGE) 1000 MG tablet Take 1,000 mg by mouth 2 (two) times daily with a meal.    Yes Historical Provider, MD  simvastatin (ZOCOR) 20 MG tablet Take 20 mg by mouth daily at 6 PM.  07/29/14  Yes Historical Provider, MD  spironolactone (ALDACTONE) 25 MG tablet Take 25 mg by mouth daily.   Yes Historical Provider, MD  torsemide (DEMADEX) 20 MG tablet Take 2 tablets by mouth daily. 03/17/13  Yes Historical Provider, MD  diclofenac (VOLTAREN) 50 MG EC tablet Take 1 tablet (50 mg total) by mouth 2 (two) times daily. 12/24/15   Fransico Meadow, PA-C    methocarbamol (ROBAXIN) 500 MG tablet Take 1 tablet (500 mg total) by mouth 2 (two) times daily. 12/24/15   Fransico Meadow, PA-C    Family History Family History  Problem Relation Age of Onset  . Diabetes Father   . Hypertension Father   . Parkinson's disease Father   . Cancer Father     not sure what kind  . Hypertension Mother   . Heart disease Mother   . Drug abuse Sister   . Mesothelioma Brother   . Cancer Daughter     non-hodgkins lmphoma  . Hypertension Brother   . Diabetes Brother   . Colon cancer Neg Hx     Social History Social History  Substance Use Topics  . Smoking status: Former Smoker    Quit date: 12/01/2001  . Smokeless tobacco: Never Used  . Alcohol use No     Allergies   Penicillins   Review of Systems Review of Systems  Cardiovascular: Positive for chest pain.  All other systems reviewed and are negative.    Physical Exam Updated Vital Signs BP 163/82   Pulse (!) 45   Temp 98.5 F (36.9 C) (Oral)   Resp 21   Ht 5\' 8"  (1.727 m)   Wt 126.1 kg   SpO2 98%   BMI 42.27 kg/m   Physical Exam  Constitutional: She is oriented to person, place, and time. She appears well-developed and well-nourished.  HENT:  Head: Normocephalic.  Eyes: EOM are normal.  Neck: Normal range of motion.  Pulmonary/Chest: Effort normal.  Abdominal: She exhibits no distension.  Musculoskeletal: Normal range of motion.  Tender ls spine  From,  nv and ns intact   Neurological: She is alert and oriented to person, place, and time.  Skin: Skin is warm. Capillary refill takes less than 2 seconds.  Psychiatric: She has a normal mood and affect.  Nursing note and vitals reviewed.    ED Treatments / Results  Labs (all labs ordered are listed, but only abnormal results are displayed) Labs Reviewed  COMPREHENSIVE METABOLIC PANEL - Abnormal; Notable for the following:       Result Value   Potassium 3.1 (*)    Glucose, Bld 130 (*)    AST 12 (*)    All other  components within normal limits  CBC  TROPONIN I  TROPONIN I  TROPONIN I    EKG  EKG Interpretation  Date/Time:  Sunday December 24 2015 11:02:39 EST Ventricular Rate:  42 PR Interval:    QRS Duration: 94 QT Interval:  490 QTC Calculation: 410 R Axis:   23 Text Interpretation:  Sinus bradycardia Baseline wander in lead(s) V6 No significant change was found Confirmed by CAMPOS  MD, Lennette Bihari ([id number]) on 12/24/2015 1:04:56 PM       Radiology Dg Chest 2 View  Result Date: 12/24/2015 CLINICAL DATA:  Patient with mid chest pain after lifting a box. EXAM: CHEST  2 VIEW COMPARISON:  None. FINDINGS: Anterior cervical spinal fusion hardware. Normal cardiac and mediastinal contours. No consolidative pulmonary opacities. No pleural effusion or pneumothorax. Thoracic spine degenerative changes. IMPRESSION: No active cardiopulmonary disease. Electronically Signed   By: Lovey Newcomer M.D.   On: 12/24/2015 10:08    Procedures Procedures (including critical care time)  Medications Ordered in ED Medications  aspirin chewable tablet 324 mg (324 mg Oral Given 12/24/15 0936)     Initial Impression / Assessment and Plan / ED Course  I have reviewed the triage vital signs and the nursing notes.  Pertinent labs & imaging results that were available during my care of the patient were reviewed by me and considered in my medical decision making (see chart for details).  Clinical Course     Troponin negative x 2.  Chest xray negative,  EKG normal bradycardic.  Final Clinical Impressions(s) / ED Diagnoses   Final diagnoses:  Chest wall pain    New Prescriptions Discharge Medication List as of 12/24/2015  1:16 PM    START taking these medications   Details  diclofenac (VOLTAREN) 50 MG EC tablet Take 1 tablet (50 mg total) by mouth 2 (two) times daily., Starting Sun 12/24/2015, Print    methocarbamol (ROBAXIN) 500 MG tablet Take 1 tablet (500 mg total) by mouth 2 (two) times daily., Starting  Sun 12/24/2015, Print      An After Visit Summary was printed and given to the patient.   Hollace Kinnier Armona, PA-C 12/24/15 [place], MD 12/24/15 [phone]

## 2015-12-26 DIAGNOSIS — E876 Hypokalemia: Secondary | ICD-10-CM | POA: Diagnosis not present

## 2015-12-26 DIAGNOSIS — R079 Chest pain, unspecified: Secondary | ICD-10-CM | POA: Diagnosis not present

## 2016-01-17 ENCOUNTER — Ambulatory Visit (HOSPITAL_COMMUNITY)
Admission: RE | Admit: 2016-01-17 | Discharge: 2016-01-17 | Disposition: A | Discharge status: Home / Self Care | Payer: Medicare Other | Source: Ambulatory Visit | Attending: Internal Medicine | Admitting: Internal Medicine

## 2016-01-17 ENCOUNTER — Other Ambulatory Visit (HOSPITAL_COMMUNITY): Payer: Self-pay | Admitting: Internal Medicine

## 2016-01-17 DIAGNOSIS — R0602 Shortness of breath: Secondary | ICD-10-CM | POA: Insufficient documentation

## 2016-01-19 ENCOUNTER — Encounter (HOSPITAL_COMMUNITY): Payer: Self-pay | Admitting: *Deleted

## 2016-01-19 ENCOUNTER — Emergency Department (HOSPITAL_COMMUNITY): Payer: Medicare Other

## 2016-01-19 ENCOUNTER — Emergency Department (HOSPITAL_COMMUNITY)
Admission: EM | Admit: 2016-01-19 | Discharge: 2016-01-19 | Disposition: A | Discharge status: Home / Self Care | Payer: Medicare Other | Attending: Emergency Medicine | Admitting: Emergency Medicine

## 2016-01-19 DIAGNOSIS — Z79899 Other long term (current) drug therapy: Secondary | ICD-10-CM | POA: Diagnosis not present

## 2016-01-19 DIAGNOSIS — E119 Type 2 diabetes mellitus without complications: Secondary | ICD-10-CM | POA: Diagnosis not present

## 2016-01-19 DIAGNOSIS — Z7982 Long term (current) use of aspirin: Secondary | ICD-10-CM | POA: Diagnosis not present

## 2016-01-19 DIAGNOSIS — I10 Essential (primary) hypertension: Secondary | ICD-10-CM | POA: Diagnosis not present

## 2016-01-19 DIAGNOSIS — R0602 Shortness of breath: Secondary | ICD-10-CM | POA: Insufficient documentation

## 2016-01-19 DIAGNOSIS — Z85828 Personal history of other malignant neoplasm of skin: Secondary | ICD-10-CM | POA: Diagnosis not present

## 2016-01-19 DIAGNOSIS — Z87891 Personal history of nicotine dependence: Secondary | ICD-10-CM | POA: Diagnosis not present

## 2016-01-19 DIAGNOSIS — Z7984 Long term (current) use of oral hypoglycemic drugs: Secondary | ICD-10-CM | POA: Diagnosis not present

## 2016-01-19 LAB — TROPONIN I

## 2016-01-19 LAB — CBC
HEMATOCRIT: 42.2 % (ref 36.0–46.0)
HEMOGLOBIN: 13.6 g/dL (ref 12.0–15.0)
MCH: 28.1 pg (ref 26.0–34.0)
MCHC: 32.2 g/dL (ref 30.0–36.0)
MCV: 87.2 fL (ref 78.0–100.0)
Platelets: 260 10*3/uL (ref 150–400)
RBC: 4.84 MIL/uL (ref 3.87–5.11)
RDW: 14.4 % (ref 11.5–15.5)
WBC: 9.6 10*3/uL (ref 4.0–10.5)

## 2016-01-19 LAB — BASIC METABOLIC PANEL
Anion gap: 4 — ABNORMAL LOW (ref 5–15)
BUN: 16 mg/dL (ref 6–20)
CHLORIDE: 107 mmol/L (ref 101–111)
CO2: 28 mmol/L (ref 22–32)
Calcium: 9.1 mg/dL (ref 8.9–10.3)
Creatinine, Ser: 0.66 mg/dL (ref 0.44–1.00)
GFR calc Af Amer: >60 mL/min (ref >60)
GFR calc non Af Amer: >60 mL/min (ref >60)
Glucose, Bld: 114 mg/dL — ABNORMAL HIGH (ref 65–99)
POTASSIUM: 4.2 mmol/L (ref 3.5–5.1)
SODIUM: 139 mmol/L (ref 135–145)

## 2016-01-19 MED ORDER — IOPAMIDOL (ISOVUE-370) INJECTION 76%
125.0000 mL | Freq: Once | INTRAVENOUS | Status: AC | PRN
  Administered 2016-01-19: 125 mL via INTRAVENOUS

## 2016-01-19 NOTE — ED Provider Notes (Signed)
[facility] DEPT Provider Note   CSN: [id number] Arrival date & time: 01/19/16  [id number]  By signing my name below, I, Reola Mosher, attest that this documentation has been prepared under the direction and in the presence of Jola Schmidt, MD. Electronically Signed: Reola Mosher, ED Scribe. 01/19/16. 10:19 AM.  History   Chief Complaint Chief Complaint  Patient presents with  . Shortness of Breath  . Elevated D-Dimer   The history is provided by the patient and medical records. No language interpreter was used.    HPI Comments: Amy Tran is a 60 y.o. female with a PMHx of DM, HTN, and, multiple skin cancers, who presents to the Emergency Department complaining of increased shortness of breath with mild exertion onset approximately 3 weeks ago. She notes associated dyspnea during these episodes of SOB, but denies any chest pain/discomfort otherwise. She states that at rest her shortness of breath is alleviated completely. No h/o similar symptoms. Pt was seen for this issue by her PCP on 01/17/16 (two days ago), with workup including blood work and a CXR. Pt was called by her PCP's office this morning and informed that her D-dimer was elevated, and told to come into the ED to r/o PE. No personal h/o PE/DVT, however, she has a maternal FHx of DVT. Pt is a prior smoker of 40 years at 4ppd, quitting approximately 13 years ago. She denies orthopnea, leg swelling, fever, cough, or any other associated symptoms.   PCP: Asencion Noble, MD   Past Medical History:  Diagnosis Date  . Cancer [facility])    multiple skin cancers  . Diabetes mellitus without complication ([place])   . Fluid retention   . High cholesterol   . Hypertension   . Incomplete rotator cuff tear   . PONV (postoperative nausea and vomiting)   . Wears glasses   . Wears partial dentures    bottom   Patient Active Problem List   Diagnosis Date Noted  . Rectocele 09/19/2015  . Diverticulosis of colon without  hemorrhage   . Hx of adenomatous colonic polyps 12/01/2013  . Pain in joint, shoulder region 06/24/2013  . Muscle weakness (generalized) 06/24/2013  . Incomplete rotator cuff tear   . Diabetes mellitus without complication ([place])   . PONV (postoperative nausea and vomiting)   . Fluid retention   . Hypertension    Past Surgical History:  Procedure Laterality Date  . APPENDECTOMY    . CARPAL TUNNEL RELEASE  2001   left  . CHOLECYSTECTOMY    . COLONOSCOPY  2007   [id number] internal hemorrhoids. Diminutive rectal polyp at 10 cm, cold biopsied/removed. The remainder of the rectal mucosa appeared normal Swallow left-sided diverticula. Diminutive polyp at the splenic flexure cold biopsied/removed (adenomatous)  . COLONOSCOPY N/A 12/28/2013   Procedure: COLONOSCOPY;  Surgeon: Daneil Dolin, MD;  [facility];  Service: Endoscopy;  Laterality: N/A;  730 - moved to 8:30 - Ginger notified pt  . ERCP  2002  . GASTRIC BYPASS  2004  . NECK SURGERY  1998  . SHOULDER ARTHROSCOPY WITH SUBACROMIAL DECOMPRESSION Left 06/23/2013   Procedure: LEFT SHOULDER ARTHROSCOPY WITH DEBRIDEMENT ROTATOR CUFF AND LABRUM;  Surgeon: Lorn Junes, MD;  Location: [place] [facility];  Service: Orthopedics;  Laterality: Left;  . SHOULDER SURGERY  1999   left   OB History    No data available     Home Medications    Prior to Admission medications   Medication Sig Start  Date End Date Taking? Authorizing Provider  amLODipine (NORVASC) 5 MG tablet Take 5 mg by mouth daily.    Historical Provider, MD  aspirin EC 81 MG tablet Take 81 mg by mouth daily.    Historical Provider, MD  Canagliflozin (INVOKANA) 100 MG TABS Take 100 mg by mouth daily.    Historical Provider, MD  CVS MAGNESIUM OXIDE 500 MG TABS Take 1 tablet by mouth daily. 05/26/14   Historical Provider, MD  Cyanocobalamin (VITAMIN B12 PO) Take 1 tablet by mouth daily.    Historical Provider, MD  diclofenac (VOLTAREN) 50 MG EC tablet Take 1  tablet (50 mg total) by mouth 2 (two) times daily. 12/24/15   Fransico Meadow, PA-C  losartan (COZAAR) 100 MG tablet Take 100 mg by mouth daily.    Historical Provider, MD  metFORMIN (GLUCOPHAGE) 1000 MG tablet Take 1,000 mg by mouth 2 (two) times daily with a meal.     Historical Provider, MD  methocarbamol (ROBAXIN) 500 MG tablet Take 1 tablet (500 mg total) by mouth 2 (two) times daily. 12/24/15   Fransico Meadow, PA-C  simvastatin (ZOCOR) 20 MG tablet Take 20 mg by mouth daily at 6 PM.  07/29/14   Historical Provider, MD  spironolactone (ALDACTONE) 25 MG tablet Take 25 mg by mouth daily.    Historical Provider, MD  torsemide (DEMADEX) 20 MG tablet Take 2 tablets by mouth daily. 03/17/13   Historical Provider, MD   Family History Family History  Problem Relation Age of Onset  . Diabetes Father   . Hypertension Father   . Parkinson's disease Father   . Cancer Father     not sure what kind  . Hypertension Mother   . Heart disease Mother   . Drug abuse Sister   . Mesothelioma Brother   . Cancer Daughter     non-hodgkins lmphoma  . Hypertension Brother   . Diabetes Brother   . Colon cancer Neg Hx    Social History Social History  Substance Use Topics  . Smoking status: Former Smoker    Quit date: 12/01/2001  . Smokeless tobacco: Never Used  . Alcohol use No   Allergies   Penicillins  Review of Systems Review of Systems A complete 10 system review of systems was obtained and all systems are negative except as noted in the HPI and PMH.   Physical Exam Updated Vital Signs BP 174/75   Pulse (!) 48   Temp 98.2 F (36.8 C) (Oral)   Resp 21   Ht 5\' 4"  (1.626 m)   Wt 289 lb (131.1 kg)   SpO2 97%   BMI 49.61 kg/m   Physical Exam  Constitutional: She is oriented to person, place, and time. She appears well-developed and well-nourished. No distress.  HENT:  Head: Normocephalic and atraumatic.  Eyes: EOM are normal.  Neck: Normal range of motion.  Cardiovascular: Normal  rate, regular rhythm and normal heart sounds.   Pulmonary/Chest: Effort normal and breath sounds normal.  Abdominal: Soft. She exhibits no distension. There is no tenderness.  Musculoskeletal: Normal range of motion.  Neurological: She is alert and oriented to person, place, and time.  Skin: Skin is warm and dry.  Psychiatric: She has a normal mood and affect. Judgment normal.  Nursing note and vitals reviewed.  ED Treatments / Results  DIAGNOSTIC STUDIES: Oxygen Saturation is 99% on RA, normal by my interpretation.   COORDINATION OF CARE: 10:19 AM-Discussed next steps with pt. Pt verbalized understanding and  is agreeable with the plan.   Labs (all labs ordered are listed, but only abnormal results are displayed) Labs Reviewed  BASIC METABOLIC PANEL - Abnormal; Notable for the following:       Result Value   Glucose, Bld 114 (*)    Anion gap 4 (*)    All other components within normal limits  CBC  TROPONIN I  TROPONIN I    EKG  EKG Interpretation  Date/Time:  Friday January 19 2016 09:37:10 EST Ventricular Rate:  59 PR Interval:    QRS Duration: 117 QT Interval:  467 QTC Calculation: 400 R Axis:   46 Text Interpretation:  Sinus rhythm Multiple premature complexes, vent & supraven Nonspecific intraventricular conduction delay Low voltage, precordial leads Minimal ST elevation, inferior leads Baseline wander in lead(s) II III aVF V2 V4 V5 V6 Poor data quality No significant change was found Confirmed by Keilani Terrance  MD, Lennette Bihari ([id number]) on 01/19/2016 10:01:49 AM      Radiology Ct Angio Chest Pe W Or Wo Contrast  Result Date: 01/19/2016 CLINICAL DATA:  Short of breath EXAM: CT ANGIOGRAPHY CHEST WITH CONTRAST TECHNIQUE: Multidetector CT imaging of the chest was performed using the standard protocol during bolus administration of intravenous contrast. Multiplanar CT image reconstructions and MIPs were obtained to evaluate the vascular anatomy. CONTRAST:  125 mL Isovue 370 IV  COMPARISON:  Chest x-ray 01/17/2016 FINDINGS: Cardiovascular: Negative for pulmonary embolism. Pulmonary arteries normal in caliber. Mild atherosclerotic disease in the aortic arch without aneurysm or dissection. Heart size upper normal. Mild coronary calcification. No pericardial effusion. Mediastinum/Nodes: Negative Lungs/Pleura: Lungs are clear and well aerated. Negative for pneumonia. Negative for mass lesion. No pleural effusion. Upper Abdomen: Negative Musculoskeletal: Prior gastric bypass surgery. No acute abnormality. Review of the MIP images confirms the above findings. IMPRESSION: Negative for pulmonary embolism.  No acute abnormality in the chest. Electronically Signed   By: Franchot Gallo M.D.   On: 01/19/2016 13:18    Procedures Procedures   Medications Ordered in ED Medications - No data to display  Initial Impression / Assessment and Plan / ED Course  I have reviewed the triage vital signs and the nursing notes.  Pertinent labs & imaging results that were available during my care of the patient were reviewed by me and considered in my medical decision making (see chart for details).  Clinical Course    CTA negative for pulmonary embolism.  Troponin negative 2.  No symptoms consistent with acute coronary syndrome at this time.  She'll need outpatient cardiology follow-up as I think she'll benefit from echocardiogram.  I do not think she needs to be acutely admitted at this time.  She will return the emergency department for new or worsening symptoms.   Final Clinical Impressions(s) / ED Diagnoses   Final diagnoses:  Shortness of breath   New Prescriptions New Prescriptions   No medications on file     I personally performed the services described in this documentation, which was scribed in my presence. The recorded information has been reviewed and is accurate.       Jola Schmidt, MD 01/19/16 [phone]

## 2016-01-19 NOTE — ED Notes (Signed)
Pt ambulated in hall on RA. Pt oxygen stayed above 95% with no shortness of breath.

## 2016-01-19 NOTE — ED Notes (Signed)
Lab at bedside

## 2016-01-19 NOTE — ED Notes (Signed)
Per Dr. Willey Blade, D-Dimer is 1.16

## 2016-01-19 NOTE — ED Triage Notes (Signed)
Pt comes in with shortness of breath that occurs upon exertion. Pt was seen at Dr. Beverlee Nims office on Wednesday and had blood work and a Insurance account manager. Pt was called by PCP today and told to come to the ED. Pt denies any pain. NAD noted.

## 2016-01-19 NOTE — ED Notes (Signed)
Two IV attempts made in patients right AC by paramedic student.   Two attempts made my [employer] in [place].   Third nurse requested to attempt IV.

## 2016-02-01 DIAGNOSIS — H40013 Open angle with borderline findings, low risk, bilateral: Secondary | ICD-10-CM | POA: Diagnosis not present

## 2016-02-01 DIAGNOSIS — E119 Type 2 diabetes mellitus without complications: Secondary | ICD-10-CM | POA: Diagnosis not present

## 2016-02-02 ENCOUNTER — Encounter: Payer: Self-pay | Admitting: Cardiology

## 2016-02-02 ENCOUNTER — Ambulatory Visit (INDEPENDENT_AMBULATORY_CARE_PROVIDER_SITE_OTHER): Payer: Medicare Other | Admitting: Cardiology

## 2016-02-02 VITALS — BP 183/79 | HR 41 | Ht 63.5 in | Wt 299.0 lb

## 2016-02-02 DIAGNOSIS — I10 Essential (primary) hypertension: Secondary | ICD-10-CM | POA: Diagnosis not present

## 2016-02-02 DIAGNOSIS — R600 Localized edema: Secondary | ICD-10-CM | POA: Diagnosis not present

## 2016-02-02 DIAGNOSIS — R0602 Shortness of breath: Secondary | ICD-10-CM

## 2016-02-02 MED ORDER — TORSEMIDE 20 MG PO TABS: ORAL_TABLET | ORAL | 3 refills | Status: DC

## 2016-02-02 NOTE — Progress Notes (Signed)
Clinical Summary Amy Tran is a 60 y.o.female seen as a new consult, she is referred by Dr Willey Blade.   1. SOB - symptoms started about 2 months ago. Was doing some heavy lifting, felt sharp pain in chest midchest, 3/10 in severity. Not positional. Lasted for about 4 days. Seen in ER and negative evaluation. Shortly after developed SOB - DOE with house activities like sweeping, DOE at 1/2 block. Increased LE edema. No orthopnea. Recent weight gain. Normal weight around 279, up to 299 lbs.  - ER visit 01/19/16 with SOB. CT PE negative. EKG , trop neg x 2. CXR no acute process - nonspecific tingling pain left chest, lasts 3-4 minutes. Better with rubbing.   2. HTN - mixed compliance with meds. Has not taken meds yet today.    3. LE edema - has been on torsemide 40mg  for long time, though only takes about 4 days a week.   4. OSA - +snoring, unsure of apneic episodes, +daytime somnolence.  Past Medical History:  Diagnosis Date  . Cancer [facility])    multiple skin cancers  . Diabetes mellitus without complication ([place])   . Fluid retention   . High cholesterol   . Hypertension   . Incomplete rotator cuff tear   . PONV (postoperative nausea and vomiting)   . Wears glasses   . Wears partial dentures    bottom     Allergies  Allergen Reactions  . Penicillins Hives    Has patient had a PCN reaction causing immediate rash, facial/tongue/throat swelling, SOB or lightheadedness with hypotension: Yes Has patient had a PCN reaction causing severe rash involving mucus membranes or skin necrosis: No Has patient had a PCN reaction that required hospitalization No Has patient had a PCN reaction occurring within the last 10 years: No If all of the above answers are "NO", then may proceed with Cephalosporin use.      Current Outpatient Prescriptions  Medication Sig Dispense Refill  . amLODipine (NORVASC) 5 MG tablet Take 5 mg by mouth daily.    Marland Kitchen aspirin EC 81 MG tablet Take 81 mg by  mouth daily.    . canagliflozin (INVOKANA) 300 MG TABS tablet Take 300 mg by mouth daily before breakfast.    . [employer] MAGNESIUM OXIDE 500 MG TABS Take 1 tablet by mouth daily.  3  . Cyanocobalamin (VITAMIN B12 PO) Take 1 tablet by mouth daily.    . diclofenac (VOLTAREN) 50 MG EC tablet Take 1 tablet (50 mg total) by mouth 2 (two) times daily. (Patient not taking: Reported on 01/19/2016) 20 tablet 0  . losartan (COZAAR) 100 MG tablet Take 100 mg by mouth daily.    . metFORMIN (GLUCOPHAGE) 1000 MG tablet Take 1,000 mg by mouth 2 (two) times daily with a meal.     . methocarbamol (ROBAXIN) 500 MG tablet Take 1 tablet (500 mg total) by mouth 2 (two) times daily. (Patient not taking: Reported on 01/19/2016) 20 tablet 0  . omeprazole (PRILOSEC) 20 MG capsule Take 20 mg by mouth daily.    . simvastatin (ZOCOR) 20 MG tablet Take 20 mg by mouth daily at 6 PM.     . spironolactone (ALDACTONE) 25 MG tablet Take 25 mg by mouth daily.    Marland Kitchen torsemide (DEMADEX) 20 MG tablet Take 2 tablets by mouth daily.     No current facility-administered medications for this visit.      Past Surgical History:  Procedure Laterality Date  . APPENDECTOMY    .  CARPAL TUNNEL RELEASE  2001   left  . CHOLECYSTECTOMY    . COLONOSCOPY  2007   [id number] internal hemorrhoids. Diminutive rectal polyp at 10 cm, cold biopsied/removed. The remainder of the rectal mucosa appeared normal Swallow left-sided diverticula. Diminutive polyp at the splenic flexure cold biopsied/removed (adenomatous)  . COLONOSCOPY N/A 12/28/2013   Procedure: COLONOSCOPY;  Surgeon: Daneil Dolin, MD;  [facility];  Service: Endoscopy;  Laterality: N/A;  730 - moved to 8:30 - Ginger notified pt  . ERCP  2002  . GASTRIC BYPASS  2004  . NECK SURGERY  1998  . SHOULDER ARTHROSCOPY WITH SUBACROMIAL DECOMPRESSION Left 06/23/2013   Procedure: LEFT SHOULDER ARTHROSCOPY WITH DEBRIDEMENT ROTATOR CUFF AND LABRUM;  Surgeon: Lorn Junes, MD;  Location:  [place];  Service: Orthopedics;  Laterality: Left;  . SHOULDER SURGERY  1999   left     Allergies  Allergen Reactions  . Penicillins Hives    Has patient had a PCN reaction causing immediate rash, facial/tongue/throat swelling, SOB or lightheadedness with hypotension: Yes Has patient had a PCN reaction causing severe rash involving mucus membranes or skin necrosis: No Has patient had a PCN reaction that required hospitalization No Has patient had a PCN reaction occurring within the last 10 years: No If all of the above answers are "NO", then may proceed with Cephalosporin use.       Family History  Problem Relation Age of Onset  . Diabetes Father   . Hypertension Father   . Parkinson's disease Father   . Cancer Father     not sure what kind  . Hypertension Mother   . Heart disease Mother   . Drug abuse Sister   . Mesothelioma Brother   . Cancer Daughter     non-hodgkins lmphoma  . Hypertension Brother   . Diabetes Brother   . Colon cancer Neg Hx      Social History Ms. Sabic reports that she quit smoking about 14 years ago. She has never used smokeless tobacco. Ms. Herschberger reports that she does not drink alcohol.   Review of Systems CONSTITUTIONAL: No weight loss, fever, chills, weakness or fatigue.  HEENT: Eyes: No visual loss, blurred vision, double vision or yellow sclerae.No hearing loss, sneezing, congestion, runny nose or sore throat.  SKIN: No rash or itching.  CARDIOVASCULAR: per hpi RESPIRATORY: per hpi  GASTROINTESTINAL: No anorexia, nausea, vomiting or diarrhea. No abdominal pain or blood.  GENITOURINARY: No burning on urination, no polyuria NEUROLOGICAL: No headache, dizziness, syncope, paralysis, ataxia, numbness or tingling in the extremities. No change in bowel or bladder control.  MUSCULOSKELETAL: No muscle, back pain, joint pain or stiffness.  LYMPHATICS: No enlarged nodes. No history of splenectomy.  PSYCHIATRIC: No history  of depression or anxiety.  ENDOCRINOLOGIC: No reports of sweating, cold or heat intolerance. No polyuria or polydipsia.  Marland Kitchen   Physical Examination Vitals:   02/02/16 0907 02/02/16 0915  BP: (!) 168/76 (!) 183/79  Pulse: (!) 40 (!) 41   Vitals:   02/02/16 0907  Weight: 299 lb (135.6 kg)  Height: 5' 3.5" (1.613 m)    Gen: resting comfortably, no acute distress HEENT: no scleral icterus, pupils equal round and reactive, no palptable cervical adenopathy,  CV: RRR, no m/rg, no jvd Resp: Clear to auscultation bilaterally GI: abdomen is soft, non-tender, non-distended, normal bowel sounds, no hepatosplenomegaly MSK: extremities are warm, no edema.  Skin: warm, no rash Neuro:  no focal deficits Psych: appropriate  affect     Assessment and Plan  1. SOB - evidence of volume overload. We will increase torsemide to 40mg  in AM and 20mg  in pm. Check BMEt and Mg in 2 weeks - obtain echo   2. HTN - elevated in clinic, follow with incresed diureses  3. LE edema - obtain echo, increase torsemide as described above  4. OSA screen - some signs and symptoms of OSA. Likely pursue sleep testing in near future.    Arnoldo Lenis, M.D.

## 2016-02-02 NOTE — Patient Instructions (Signed)
Your physician recommends that you schedule a follow-up appointment in: [place] [place]   Your physician has recommended you make the following change in your medication:   INCREASE TORSEMIDE 40 MG IN THE MORNING AND 20 MG IN THE EVENING   Your physician recommends that you return for lab work in: 2 WEEKS BMP.MG  Your physician has requested that you have an echocardiogram. Echocardiography is a painless test that uses sound waves to create images of your heart. It provides your doctor with information about the size and shape of your heart and how well your heart's chambers and valves are working. This procedure takes approximately one hour. There are no restrictions for this procedure.   Thank you for choosing [facility]!!

## 2016-02-09 ENCOUNTER — Ambulatory Visit (HOSPITAL_COMMUNITY)
Admission: RE | Admit: 2016-02-09 | Discharge: 2016-02-09 | Disposition: A | Discharge status: Home / Self Care | Payer: Medicare Other | Source: Ambulatory Visit | Attending: Cardiology | Admitting: Cardiology

## 2016-02-09 DIAGNOSIS — E669 Obesity, unspecified: Secondary | ICD-10-CM | POA: Diagnosis not present

## 2016-02-09 DIAGNOSIS — E119 Type 2 diabetes mellitus without complications: Secondary | ICD-10-CM | POA: Insufficient documentation

## 2016-02-09 DIAGNOSIS — R0602 Shortness of breath: Secondary | ICD-10-CM | POA: Insufficient documentation

## 2016-02-09 DIAGNOSIS — I10 Essential (primary) hypertension: Secondary | ICD-10-CM | POA: Insufficient documentation

## 2016-02-09 LAB — ECHOCARDIOGRAM COMPLETE
AO mean calculated velocity dopler: 127 cm/s
AOVTI: 48.5 cm
AV Area VTI: 2.39 cm2
AV Mean grad: 9 mmHg
AV area mean vel ind: 1.1 cm2/m2
AV peak Index: 0.94
AV vel: 2.78
AVA: 2.78 cm2
AVAREAMEANV: 2.82 cm2
AVAREAVTIIND: 1.09 cm2/m2
AVCELMEANRAT: 0.9
AVLVOTPG: 14 mmHg
AVPG: 24 mmHg
AVPKVEL: 244 cm/s
Ao pk vel: 0.76 m/s
CHL CUP AV VALUE AREA INDEX: 1.09
CHL CUP STROKE VOLUME: 54 mL
EERAT: 11.87
EWDT: 246 ms
FS: 41 % (ref 28–44)
IV/PV OW: 0.91
LA ID, A-P, ES: 48 mm
LA diam index: 1.88 cm/m2
LA vol: 80.9 mL
LAVOLA4C: 82.5 mL
LAVOLIN: 31.7 mL/m2
LDCA: 3.14 cm2
LEFT ATRIUM END SYS DIAM: 48 mm
LV E/e' medial: 11.87
LV PW d: 10.7 mm — AB (ref 0.6–1.1)
LV TDI E'LATERAL: 10.7
LV TDI E'MEDIAL: 5.33
LV dias vol: 84 mL (ref 46–106)
LV e' LATERAL: 10.7 cm/s
LV sys vol: 31 mL (ref 14–42)
LVDIAVOLIN: 33 mL/m2
LVEEAVG: 11.87
LVOT SV: 135 mL
LVOT VTI: 42.9 cm
LVOT diameter: 20 mm
LVOT peak VTI: 0.88 cm
LVOTPV: 186 cm/s
LVSYSVOLIN: 12 mL/m2
MV Dec: 246
MV pk A vel: 80.5 m/s
MV pk E vel: 127 m/s
MVPG: 6 mmHg
RV LATERAL S' VELOCITY: 16.2 cm/s
RV TAPSE: 21.9 mm
RV sys press: 32 mmHg
Reg peak vel: 267 cm/s
Simpson's disk: 63
TRMAXVEL: 267 cm/s

## 2016-02-09 NOTE — Progress Notes (Signed)
*  PRELIMINARY RESULTS* Echocardiogram 2D Echocardiogram has been performed.  Amy Tran 02/09/2016, 1:47 PM

## 2016-02-14 ENCOUNTER — Telehealth: Payer: Self-pay | Admitting: Cardiology

## 2016-02-14 NOTE — Telephone Encounter (Signed)
Patient calling for results of ECHO

## 2016-02-14 NOTE — Telephone Encounter (Signed)
Pt aware that once Dr. Harl Bowie reads results we would call - also aware that Dr. Harl Bowie out of office until 02/15/16

## 2016-02-15 ENCOUNTER — Telehealth: Payer: Self-pay | Admitting: *Deleted

## 2016-02-15 NOTE — Telephone Encounter (Signed)
Pt aware - says SOB hasn't worsened but not any better - now c/o cramps in rib cage starting yesterday - denies any other symptoms - says she will have labs drawn tomorrow

## 2016-02-15 NOTE — Telephone Encounter (Signed)
-----   Message from Arnoldo Lenis, MD sent at 02/15/2016  2:51 PM EST ----- Echo overall looks good, her heart function is normal. Will discuss in detail at her f/u. How are her symptoms doing?  Zandra Abts MD

## 2016-02-15 NOTE — Telephone Encounter (Signed)
Duplicate phone note opened today 02/15/16 - that was routed to Dr Harl Bowie regarding f/u on symptoms

## 2016-02-15 NOTE — Telephone Encounter (Signed)
-----   Message from Drema Dallas, [place] sent at 02/15/2016  3:04 PM EST -----  Ledell Noss pt. ----- Message ----- From: Arnoldo Lenis, MD Sent: 02/15/2016   2:51 PM To: Drema Dallas, CMA  Echo overall looks good, her heart function is normal. Will discuss in detail at her f/u. How are her symptoms doing?  Zandra Abts MD

## 2016-02-16 DIAGNOSIS — R0602 Shortness of breath: Secondary | ICD-10-CM | POA: Diagnosis not present

## 2016-02-20 DIAGNOSIS — J069 Acute upper respiratory infection, unspecified: Secondary | ICD-10-CM | POA: Diagnosis not present

## 2016-02-27 ENCOUNTER — Telehealth: Payer: Self-pay | Admitting: *Deleted

## 2016-02-27 NOTE — Telephone Encounter (Signed)
Pt aware - says still having SOB when walking a short distance - has f/u appt with Dr. Harl Bowie tomorrow

## 2016-02-27 NOTE — Telephone Encounter (Signed)
-----   Message from Arnoldo Lenis, MD sent at 02/26/2016  1:10 PM EST ----- Labs look good. How has she been feeling?   Zandra Abts MD

## 2016-02-28 ENCOUNTER — Ambulatory Visit: Payer: Medicare Other | Admitting: Cardiology

## 2016-04-05 ENCOUNTER — Ambulatory Visit: Payer: Medicare Other | Admitting: Cardiology

## 2016-04-09 ENCOUNTER — Encounter: Payer: Self-pay | Admitting: Cardiology

## 2016-04-09 ENCOUNTER — Ambulatory Visit (INDEPENDENT_AMBULATORY_CARE_PROVIDER_SITE_OTHER): Payer: Medicare Other | Admitting: Cardiology

## 2016-04-09 VITALS — BP 152/82 | HR 49 | Ht 64.0 in | Wt 297.0 lb

## 2016-04-09 DIAGNOSIS — R600 Localized edema: Secondary | ICD-10-CM

## 2016-04-09 DIAGNOSIS — R079 Chest pain, unspecified: Secondary | ICD-10-CM

## 2016-04-09 DIAGNOSIS — I10 Essential (primary) hypertension: Secondary | ICD-10-CM

## 2016-04-09 DIAGNOSIS — R0602 Shortness of breath: Secondary | ICD-10-CM | POA: Diagnosis not present

## 2016-04-09 NOTE — Patient Instructions (Signed)
Medication Instructions:  Your physician recommends that you continue on your current medications as directed. Please refer to the Current Medication list given to you today.  Labwork: none  Testing/Procedures: Your physician has requested that you have a lexiscan myoview. For further information please visit [email or web address]. Please follow instruction sheet, as given.  Follow-Up: Your physician recommends that you schedule a follow-up appointment in: 1 month  Any Other Special Instructions Will Be Listed Below (If Applicable).  If you need a refill on your cardiac medications before your next appointment, please call your pharmacy. 

## 2016-04-09 NOTE — Progress Notes (Signed)
Clinical Summary Amy Tran is a 61 y.o.female seen today for follow up of the following medical problems.   1. SOB - symptoms started about 2 months ago. Was doing some heavy lifting, felt sharp pain in chest midchest, 3/10 in severity. Not positional. Lasted for about 4 days. Seen in ER and negative evaluation. Shortly after developed SOB - DOE with house activities like sweeping, DOE at 1/2 block. Increased LE edema. No orthopnea. Recent weight gain. Normal weight around 279, up to 299 lbs.  - ER visit 01/19/16 with SOB. CT PE negative. EKG , trop neg x 2. CXR no acute process - nonspecific tingling pain left chest, lasts 3-4 minutes. Better with rubbing.  -01/2016 echo LVEF 60-65%, cannot evluate diastolic function - last visit we increased torsemide to 40mg  in AM and 20mg  in PM  - since last visit ongoing SOB. Mild nonspecific pain, feels like a pin sticking in left chest.  - former smoker x 30 years  2. HTN - mixed compliance with meds. Has not taken meds yet this morning.    3. LE edema -mixed compliance with torsemide,  only takes about 4 days a week.   4. OSA screen - +snoring, unsure of apneic episodes, +daytime somnolence.   Past Medical History:  Diagnosis Date  . Cancer [facility])    multiple skin cancers  . Diabetes mellitus without complication ([place])   . Fluid retention   . High cholesterol   . Hypertension   . Incomplete rotator cuff tear   . PONV (postoperative nausea and vomiting)   . Wears glasses   . Wears partial dentures    bottom     Allergies  Allergen Reactions  . Penicillins Hives    Has patient had a PCN reaction causing immediate rash, facial/tongue/throat swelling, SOB or lightheadedness with hypotension: Yes Has patient had a PCN reaction causing severe rash involving mucus membranes or skin necrosis: No Has patient had a PCN reaction that required hospitalization No Has patient had a PCN reaction occurring within the last 10 years:  No If all of the above answers are "NO", then may proceed with Cephalosporin use.      Current Outpatient Prescriptions  Medication Sig Dispense Refill  . amLODipine (NORVASC) 5 MG tablet Take 5 mg by mouth daily.    Marland Kitchen aspirin EC 81 MG tablet Take 81 mg by mouth daily.    . canagliflozin (INVOKANA) 300 MG TABS tablet Take 300 mg by mouth daily before breakfast.    . CVS MAGNESIUM OXIDE 500 MG TABS Take 1 tablet by mouth daily.  3  . Cyanocobalamin (VITAMIN B12 PO) Take 1 tablet by mouth daily.    Marland Kitchen losartan (COZAAR) 100 MG tablet Take 100 mg by mouth daily.    . metFORMIN (GLUCOPHAGE) 1000 MG tablet Take 1,000 mg by mouth 2 (two) times daily with a meal.     . simvastatin (ZOCOR) 20 MG tablet Take 20 mg by mouth daily at 6 PM.     . spironolactone (ALDACTONE) 25 MG tablet Take 25 mg by mouth daily.    Marland Kitchen torsemide (DEMADEX) 20 MG tablet TAKE 2 TABS IN AM AND 1 TAB IN PM 90 tablet 3   No current facility-administered medications for this visit.      Past Surgical History:  Procedure Laterality Date  . APPENDECTOMY    . CARPAL TUNNEL RELEASE  2001   left  . CHOLECYSTECTOMY    . COLONOSCOPY  2007  [id number] internal hemorrhoids. Diminutive rectal polyp at 10 cm, cold biopsied/removed. The remainder of the rectal mucosa appeared normal Swallow left-sided diverticula. Diminutive polyp at the splenic flexure cold biopsied/removed (adenomatous)  . COLONOSCOPY N/A 12/28/2013   Procedure: COLONOSCOPY;  Surgeon: Daneil Dolin, MD;  [facility];  Service: Endoscopy;  Laterality: N/A;  730 - moved to 8:30 - Ginger notified pt  . ERCP  2002  . GASTRIC BYPASS  2004  . NECK SURGERY  1998  . SHOULDER ARTHROSCOPY WITH SUBACROMIAL DECOMPRESSION Left 06/23/2013   Procedure: LEFT SHOULDER ARTHROSCOPY WITH DEBRIDEMENT ROTATOR CUFF AND LABRUM;  Surgeon: Lorn Junes, MD;  Location: [place];  Service: Orthopedics;  Laterality: Left;  . SHOULDER SURGERY  1999   left       Allergies  Allergen Reactions  . Penicillins Hives    Has patient had a PCN reaction causing immediate rash, facial/tongue/throat swelling, SOB or lightheadedness with hypotension: Yes Has patient had a PCN reaction causing severe rash involving mucus membranes or skin necrosis: No Has patient had a PCN reaction that required hospitalization No Has patient had a PCN reaction occurring within the last 10 years: No If all of the above answers are "NO", then may proceed with Cephalosporin use.       Family History  Problem Relation Age of Onset  . Diabetes Father   . Hypertension Father   . Parkinson's disease Father   . Cancer Father     not sure what kind  . Hypertension Mother   . Heart disease Mother   . Drug abuse Sister   . Mesothelioma Brother   . Cancer Daughter     non-hodgkins lmphoma  . Hypertension Brother   . Diabetes Brother   . Colon cancer Neg Hx      Social History Ms. Delcour reports that she quit smoking about 14 years ago. Her smoking use included Cigarettes. She started smoking about 46 years ago. She has a 29.00 pack-year smoking history. She has never used smokeless tobacco. Ms. Guck reports that she does not drink alcohol.   Review of Systems CONSTITUTIONAL: No weight loss, fever, chills, weakness or fatigue.  HEENT: Eyes: No visual loss, blurred vision, double vision or yellow sclerae.No hearing loss, sneezing, congestion, runny nose or sore throat.  SKIN: No rash or itching.  CARDIOVASCULAR: per HPI RESPIRATORY: per HPI GASTROINTESTINAL: No anorexia, nausea, vomiting or diarrhea. No abdominal pain or blood.  GENITOURINARY: No burning on urination, no polyuria NEUROLOGICAL: No headache, dizziness, syncope, paralysis, ataxia, numbness or tingling in the extremities. No change in bowel or bladder control.  MUSCULOSKELETAL: No muscle, back pain, joint pain or stiffness.  LYMPHATICS: No enlarged nodes. No history of splenectomy.   PSYCHIATRIC: No history of depression or anxiety.  ENDOCRINOLOGIC: No reports of sweating, cold or heat intolerance. No polyuria or polydipsia.  Marland Kitchen   Physical Examination Vitals:   04/09/16 0846  BP: (!) 152/82  Pulse: (!) 49   Vitals:   04/09/16 0846  Weight: 297 lb (134.7 kg)  Height: 5\' 4"  (1.626 m)    Gen: resting comfortably, no acute distress HEENT: no scleral icterus, pupils equal round and reactive, no palptable cervical adenopathy,  CV: RRR, 2/6 systolic murmur RUSB, no jvd Resp: Clear to auscultation bilaterally GI: abdomen is soft, non-tender, non-distended, normal bowel sounds, no hepatosplenomegaly MSK: extremities are warm, no edema.  Skin: warm, no rash Neuro:  no focal deficits Psych: appropriate affect   Diagnostic Studies  01/2016 echo Study Conclusions  - Left ventricle: The cavity size was mildly dilated. Wall   thickness was normal. Systolic function was normal. The estimated   ejection fraction was in the range of 60% to 65%. The study is   not technically sufficient to allow evaluation of LV diastolic   function. - Pulmonary arteries: PA peak pressure: 32 mm Hg (S). - Pericardium, extracardiac: A trivial pericardial effusion was   identified.    Assessment and Plan   1. SOB/Chest pain - fairly benign echo recently. Ongoing SOB and chest pain - will obtain lexiscan 2 day protocol.  - if negative stress test, consider PFTs given her tobacco history.    2. HTN - elevated in clinic, however has not taken meds yet today - continue to monitor.   3. LE edema - encouraged increased compliance with diuretics.   4. OSA screen - some signs and symptoms of OSA. We will likely pursue sleep testing in near future.   5. Bradycardia - chronic sinus bradycardia asymptomatic - continue to monitor. Avoid av nodal agents.  F/u 1 month  Arnoldo Lenis, M.D.

## 2016-04-15 ENCOUNTER — Encounter (HOSPITAL_COMMUNITY): Payer: Self-pay

## 2016-04-15 ENCOUNTER — Encounter (HOSPITAL_COMMUNITY)
Admission: RE | Admit: 2016-04-15 | Discharge: 2016-04-15 | Disposition: A | Discharge status: Home / Self Care | Payer: Medicare Other | Source: Ambulatory Visit | Attending: Cardiology | Admitting: Cardiology

## 2016-04-15 ENCOUNTER — Inpatient Hospital Stay (HOSPITAL_COMMUNITY): Admission: RE | Admit: 2016-04-15 | Payer: Medicare Other | Source: Ambulatory Visit

## 2016-04-15 DIAGNOSIS — R079 Chest pain, unspecified: Secondary | ICD-10-CM | POA: Insufficient documentation

## 2016-04-15 MED ORDER — REGADENOSON 0.4 MG/5ML IV SOLN
INTRAVENOUS | Status: AC
  Administered 2016-04-15: 0.4 mg via INTRAVENOUS
  Filled 2016-04-15: qty 5

## 2016-04-15 MED ORDER — SODIUM CHLORIDE 0.9% FLUSH
INTRAVENOUS | Status: AC
  Administered 2016-04-15: 10 mL via INTRAVENOUS
  Filled 2016-04-15: qty 10

## 2016-04-15 MED ORDER — TECHNETIUM TC 99M TETROFOSMIN IV KIT
30.0000 | PACK | Freq: Once | INTRAVENOUS | Status: AC | PRN
  Administered 2016-04-15: 31 via INTRAVENOUS

## 2016-04-16 ENCOUNTER — Encounter (HOSPITAL_COMMUNITY)
Admission: RE | Admit: 2016-04-16 | Discharge: 2016-04-16 | Disposition: A | Discharge status: Home / Self Care | Payer: Medicare Other | Source: Ambulatory Visit | Attending: Cardiology | Admitting: Cardiology

## 2016-04-16 ENCOUNTER — Encounter (HOSPITAL_COMMUNITY): Payer: Self-pay

## 2016-04-16 DIAGNOSIS — R079 Chest pain, unspecified: Secondary | ICD-10-CM | POA: Diagnosis not present

## 2016-04-16 LAB — NM MYOCAR MULTI W/SPECT W/WALL MOTION / EF
CHL CUP NUCLEAR SDS: 1
CSEPPHR: 64 {beats}/min
LVDIAVOL: 114 mL (ref 46–106)
LVSYSVOL: 45 mL
RATE: 0.29
Rest HR: 46 {beats}/min
SRS: 4
SSS: 5
TID: 0.85

## 2016-04-16 MED ORDER — TECHNETIUM TC 99M TETROFOSMIN IV KIT
25.0000 | PACK | Freq: Once | INTRAVENOUS | Status: AC | PRN
  Administered 2016-04-16: 25.4 via INTRAVENOUS

## 2016-04-17 ENCOUNTER — Telehealth: Payer: Self-pay

## 2016-04-17 DIAGNOSIS — R0602 Shortness of breath: Secondary | ICD-10-CM

## 2016-04-17 NOTE — Telephone Encounter (Signed)
-----   Message from [place] sent at 04/17/2016 12:00 PM EST -----   ----- Message ----- From: Arnoldo Lenis, MD Sent: 04/17/2016  11:55 AM To: Massie Maroon, CMA  Stress test looks good. Overall her heart tests have looked good, no evidence of a cause for her SOB. I would like to order PFTs for her to be done at [facility] MD

## 2016-04-17 NOTE — Telephone Encounter (Signed)
Pt made aware, copy to pcp. Placed order for PFT. Will have Terry schedule and call pt.

## 2016-04-19 ENCOUNTER — Ambulatory Visit (HOSPITAL_COMMUNITY)
Admission: RE | Admit: 2016-04-19 | Discharge: 2016-04-19 | Disposition: A | Discharge status: Home / Self Care | Payer: Medicare Other | Source: Ambulatory Visit | Attending: Cardiology | Admitting: Cardiology

## 2016-04-19 DIAGNOSIS — R0602 Shortness of breath: Secondary | ICD-10-CM | POA: Diagnosis not present

## 2016-04-19 MED ORDER — ALBUTEROL SULFATE (2.5 MG/3ML) 0.083% IN NEBU
2.5000 mg | INHALATION_SOLUTION | Freq: Once | RESPIRATORY_TRACT | Status: AC
Start: 2016-04-19 — End: 2016-04-19
  Administered 2016-04-19: 2.5 mg via RESPIRATORY_TRACT

## 2016-04-24 ENCOUNTER — Other Ambulatory Visit: Payer: Self-pay

## 2016-04-24 DIAGNOSIS — R0602 Shortness of breath: Secondary | ICD-10-CM

## 2016-04-24 LAB — PULMONARY FUNCTION TEST
DL/VA % PRED: 86 %
DL/VA: 4.13 ml/min/mmHg/L
DLCO COR: 19.12 ml/min/mmHg
DLCO cor % pred: 78 %
DLCO unc % pred: 78 %
DLCO unc: 19.12 ml/min/mmHg
FEF 25-75 Post: 2.81 L/sec
FEF 25-75 Pre: 1.89 L/sec
FEF2575-%CHANGE-POST: 48 %
FEF2575-%Pred-Post: 121 %
FEF2575-%Pred-Pre: 81 %
FEV1-%Change-Post: 13 %
FEV1-%PRED-PRE: 83 %
FEV1-%Pred-Post: 94 %
FEV1-POST: 2.4 L
FEV1-PRE: 2.12 L
FEV1FVC-%CHANGE-POST: -1 %
FEV1FVC-%Pred-Pre: 100 %
FEV6-%CHANGE-POST: 15 %
FEV6-%PRED-PRE: 85 %
FEV6-%Pred-Post: 98 %
FEV6-POST: 3.13 L
FEV6-PRE: 2.72 L
FEV6FVC-%PRED-PRE: 104 %
FEV6FVC-%Pred-Post: 104 %
FVC-%Change-Post: 15 %
FVC-%PRED-POST: 95 %
FVC-%Pred-Pre: 82 %
FVC-POST: 3.13 L
FVC-Pre: 2.72 L
POST FEV6/FVC RATIO: 100 %
PRE FEV1/FVC RATIO: 78 %
Post FEV1/FVC ratio: 77 %
Pre FEV6/FVC Ratio: 100 %
RV % PRED: 134 %
RV: 2.7 L
TLC % PRED: 105 %
TLC: 5.34 L

## 2016-04-25 ENCOUNTER — Telehealth: Payer: Self-pay

## 2016-04-25 DIAGNOSIS — R0602 Shortness of breath: Secondary | ICD-10-CM

## 2016-04-25 NOTE — Telephone Encounter (Signed)
Pt made aware, placed an order for pulmonary

## 2016-04-25 NOTE — Telephone Encounter (Signed)
-----   Message from Arnoldo Lenis, MD sent at 04/25/2016  2:28 PM EDT ----- Abnormal PFTs, please refer to pulmonary for SOB.    Zandra Abts MD

## 2016-05-03 ENCOUNTER — Ambulatory Visit: Payer: Medicare Other | Admitting: Cardiology

## 2016-05-07 DIAGNOSIS — M75121 Complete rotator cuff tear or rupture of right shoulder, not specified as traumatic: Secondary | ICD-10-CM | POA: Diagnosis not present

## 2016-05-09 ENCOUNTER — Ambulatory Visit: Payer: Medicare Other | Admitting: Adult Health

## 2016-05-13 ENCOUNTER — Other Ambulatory Visit: Payer: Self-pay | Admitting: Pharmacy Technician

## 2016-05-16 NOTE — Patient Outreach (Signed)
I spoke with Amy Tran in reference to [facility] medication adherence measures. Per our conversation patient admits that she forgets to take her medication's. She verified that she is still being prescribed Invokana, Losartan, and Simvastatin. I will be referring her to Amy Tran, Amy Tran to follow up.  Amy Tran, [place]

## 2016-05-23 ENCOUNTER — Other Ambulatory Visit: Payer: Self-pay | Admitting: Pharmacist

## 2016-05-23 NOTE — Patient Outreach (Signed)
[facility]) Care Management  05/23/2016  Amy Tran 27-May-1955 [id number]  [facility] CM Pharmacist received referral on patient from [place] Technician due to concerns for patient adherence to medications.   Phone outreach to patient, female answered call and stated patient was not available.  HIPAA compliant message left for patient to return call.   Plan:  Will make another phone outreach to patient next week.   Karrie Meres, PharmD, [place] [phone]

## 2016-05-28 ENCOUNTER — Other Ambulatory Visit: Payer: Self-pay | Admitting: Pharmacist

## 2016-05-28 NOTE — Patient Outreach (Signed)
[facility]) Care Management  05/28/2016  DANICE DIPPOLITO 09-16-1955 [id number]  Successful phone outreach to patient, HIPAA details verified.  Patient was contacted after referral from [place], regarding medication adherence.     Patient reports she does at times miss her medications.  She reports she has not needed to refill her medications lately due to having extra from "ReadyFill" automatic refill program at her pharmacy.  Patient denies side effects or medication related concerns.  She states she does forget to take her simvastatin at bedtime.   Patient was counseled this medication is best to take at bedtime, however, if she finds herself missing her simvastatin frequently, she could take it at a different time of day, such as morning or earlier in the evening.   Patient verbalized understanding.    Patient reports she has [employer] Dual [employer] insurance---counseled her she may have some cost savings if she were to obtain a 90 day supply of her maintenance medications and she states she plans to discuss with her prescriber.   Plan:  Will close pharmacy case at patient denies further medication questions.  She has [facility] Pharmacist phone number should new questions/concerns to her medications arise.   Karrie Meres, PharmD, [place] [phone]

## 2016-05-31 DIAGNOSIS — G4700 Insomnia, unspecified: Secondary | ICD-10-CM | POA: Diagnosis not present

## 2016-06-03 ENCOUNTER — Encounter: Payer: Self-pay | Admitting: Pulmonary Disease

## 2016-06-03 ENCOUNTER — Ambulatory Visit (INDEPENDENT_AMBULATORY_CARE_PROVIDER_SITE_OTHER): Payer: Medicare Other | Admitting: Pulmonary Disease

## 2016-06-03 VITALS — BP 122/80 | HR 64 | Ht 64.0 in | Wt 301.4 lb

## 2016-06-03 DIAGNOSIS — J4530 Mild persistent asthma, uncomplicated: Secondary | ICD-10-CM

## 2016-06-03 DIAGNOSIS — Z6841 Body Mass Index (BMI) 40.0 and over, adult: Secondary | ICD-10-CM | POA: Diagnosis not present

## 2016-06-03 DIAGNOSIS — R5381 Other malaise: Secondary | ICD-10-CM

## 2016-06-03 DIAGNOSIS — R0609 Other forms of dyspnea: Secondary | ICD-10-CM

## 2016-06-03 LAB — NITRIC OXIDE: NITRIC OXIDE: 11

## 2016-06-03 MED ORDER — ALBUTEROL SULFATE HFA 108 (90 BASE) MCG/ACT IN AERS: 2.0000 | INHALATION_SPRAY | Freq: Four times a day (QID) | RESPIRATORY_TRACT | 5 refills | Status: DC | PRN

## 2016-06-03 MED ORDER — FLUTICASONE FUROATE 100 MCG/ACT IN AEPB: 1.0000 | INHALATION_SPRAY | Freq: Every day | RESPIRATORY_TRACT | 0 refills | Status: AC

## 2016-06-03 MED ORDER — FLUTICASONE FUROATE 100 MCG/ACT IN AEPB: 100.0000 ug | INHALATION_SPRAY | Freq: Every day | RESPIRATORY_TRACT | 5 refills | Status: DC

## 2016-06-03 NOTE — Progress Notes (Addendum)
Past surgical history She  has a past surgical history that includes Gastric bypass (2004); Carpal tunnel release (2001); Cholecystectomy; Neck surgery (1998); Shoulder surgery (1999); ERCP (2002); Colonoscopy (2007); Appendectomy; Shoulder arthroscopy with subacromial decompression (Left, 06/23/2013); and Colonoscopy (N/A, 12/28/2013).  Family history Her family history includes Cancer in her daughter and father; Diabetes in her brother and father; Drug abuse in her sister; Heart disease in her mother; Hypertension in her brother, father, and mother; Mesothelioma in her brother; Parkinson's disease in her father.  Social history She  reports that she quit smoking about 14 years ago. Her smoking use included Cigarettes. She started smoking about 46 years ago. She has a 29.00 pack-year smoking history. She has never used smokeless tobacco. She reports that she does not drink alcohol or use drugs.  Allergies  Allergen Reactions  . Penicillins Hives    Has patient had a PCN reaction causing immediate rash, facial/tongue/throat swelling, SOB or lightheadedness with hypotension: Yes Has patient had a PCN reaction causing severe rash involving mucus membranes or skin necrosis: No Has patient had a PCN reaction that required hospitalization No Has patient had a PCN reaction occurring within the last 10 years: No If all of the above answers are "NO", then may proceed with Cephalosporin use.    Review of Systems  Constitutional: Negative for fever and unexpected weight change.  HENT: Negative for congestion, dental problem, ear pain, nosebleeds, postnasal drip, rhinorrhea, sinus pressure, sneezing, sore throat and trouble swallowing.   Eyes: Negative for redness and itching.  Respiratory: Positive for shortness of breath. Negative for cough, chest tightness and wheezing.   Cardiovascular: Positive for chest pain ( tightness). Negative for palpitations and leg swelling.  Gastrointestinal: Negative for  nausea and vomiting.  Genitourinary: Negative for dysuria.  Musculoskeletal: Positive for joint swelling.  Skin: Negative for rash ( itching).  Neurological: Positive for headaches.  Hematological: Does not bruise/bleed easily.  Psychiatric/Behavioral: Negative for dysphoric mood. The patient is not nervous/anxious.    Current Outpatient Prescriptions on File Prior to Visit  Medication Sig  . amLODipine (NORVASC) 5 MG tablet Take 5 mg by mouth daily.  Marland Kitchen aspirin EC 81 MG tablet Take 81 mg by mouth daily.  . canagliflozin (INVOKANA) 300 MG TABS tablet Take 300 mg by mouth daily before breakfast.  . [employer] MAGNESIUM OXIDE 500 MG TABS Take 1 tablet by mouth daily.  . Cyanocobalamin (VITAMIN B12 PO) Take 1 tablet by mouth daily.  Marland Kitchen losartan (COZAAR) 100 MG tablet Take 100 mg by mouth daily.  . metFORMIN (GLUCOPHAGE) 1000 MG tablet Take 1,000 mg by mouth 2 (two) times daily with a meal.   . simvastatin (ZOCOR) 20 MG tablet Take 20 mg by mouth daily at 6 PM.   . spironolactone (ALDACTONE) 25 MG tablet Take 25 mg by mouth daily.  Marland Kitchen torsemide (DEMADEX) 20 MG tablet TAKE 2 TABS IN AM AND 1 TAB IN PM   No current facility-administered medications on file prior to visit.     Chief Complaint  Patient presents with  . PULMONARY CONSULT    Referred by Dr Harl Bowie for SOB and abn PFT.    Pulmonary tests CT angio chest 01/19/16 >> mild atherosclerosis PFT 04/19/16 >> FEV1 2.40 (94%), FEV1% 77, TLC 5.34 (105%), DLCO 78%, RV 2.70 (134%), +BD FeNO 06/03/16 >> 11  Cardiac tests Echo 02/09/16 >> Ef 60 to 65%, PAS 32 mmHg  Past medical history She  has a past medical history of Cancer ([place]); Diabetes  mellitus without complication ([place]); Fluid retention; High cholesterol; Hypertension; Incomplete rotator cuff tear; PONV (postoperative nausea and vomiting); Wears glasses; and Wears partial dentures.  Vital signs BP 122/80 (BP Location: Left Wrist, Cuff Size: Normal)   Pulse 64   Ht 5\' 4"  (1.626 m)   Wt  (!) 301 lb 6.4 oz (136.7 kg)   SpO2 98%   BMI 51.74 kg/m   History of present illness SHAYE ELLING is a 61 y.o. female former smoker with dyspnea.  She has been followed by cardiology.  She has noticed trouble with her breathing for several months.  She has trouble walking up stairs, and sometimes has to rest when she is at the grocery store.  Her symptoms have come on gradually.  She will take about 5 to 10 minutes to recover.  She gets a tight feeling in her chest, and feels like she can't get enough air in.  She also gets this feeling if she talks for too long.  She denies wheezing.  She quit smoking in 2003.  She used to have "smokers cough", but this got better.  She doesn't usually bring up sputum.  She does get allergies in the Fall and less so in the Spring.  She gets itchy/watery eyes, runny nose, and has to clear her throat.  She had allergy testing years ago and was told she has allergies to mold, feathers, and dust.  She has a International aid/development worker.  She was never on allergy shots.  She is from [place].  She worked in a [employer] as a teenager.  She currently works at the [employer].  She denies recent travel or sick exposures.  She denies family history of asthma, or COPD.  She denies history of lung clots.  She has never been on inhalers before.  She had PFT's recently which showed bronchodilator responsiveness and air trapping.  She denies snoring or trouble with her sleep.  Physical exam  General - No distress ENT - No sinus tenderness, no oral exudate, no LAN, no thyromegaly, TM clear, pupils equal/reactive Cardiac - s1s2 regular, no murmur, pulses symmetric Chest - No wheeze/rales/dullness, good air entry, normal respiratory excursion Back - No focal tenderness Abd - Soft, non-tender, no organomegaly, + bowel sounds Ext - No edema Neuro - Normal strength, cranial nerves intact Skin - No rashes Psych - Normal mood, and behavior   CMP Latest Ref Rng & Units 01/19/2016  12/24/2015 06/22/2013  Glucose 65 - 99 mg/dL 114(H) 130(H) 145(H)  BUN 6 - 20 mg/dL 16 17 12   Creatinine 0.44 - 1.00 mg/dL 0.66 0.78 0.71  Sodium 135 - 145 mmol/L 139 139 141  Potassium 3.5 - 5.1 mmol/L 4.2 3.1(L) 3.2(L)  Chloride 101 - 111 mmol/L 107 102 97  CO2 22 - 32 mmol/L 28 30 32  Calcium 8.9 - 10.3 mg/dL 9.1 9.5 9.8  Total Protein 6.5 - 8.1 g/dL - 7.6 -  Total Bilirubin 0.3 - 1.2 mg/dL - 1.0 -  Alkaline Phos 38 - 126 U/L - 58 -  AST 15 - 41 U/L - 12(L) -  ALT 14 - 54 U/L - 15 -     CBC Latest Ref Rng & Units 01/19/2016 12/24/2015 06/23/2013  WBC 4.0 - 10.5 K/uL 9.6 9.1 -  Hemoglobin 12.0 - 15.0 g/dL 13.6 14.1 11.6(L)  Hematocrit 36.0 - 46.0 % 42.2 43.0 -  Platelets 150 - 400 K/uL 260 258 -    Discussion She has history of allergies and  symptoms suggestive of asthma.  Her PFT's are also suggestive of asthma.  She has dyspnea on exertion, and this has led to decrease in her activity level.  She is morbidly obese.  She likely has deconditioning contributing to her symptoms.   Assessment/plan  Allergic asthma. - Arnuity one puff daily - prn ventolin - depending on her response to therapy, she might need additional allergies testing  Morbid obesity with deconditioning. - encouraged her to start a gradual exercise regimen  Mild elevation in pulmonary pressures on recent Echo. - she denies symptoms of sleep apnea - she might need additional assessment with sleep study >> discuss further at next visit   Patient Instructions  Arnuity one puff daily  Ventolin two puffs every 4 to 6 hours as needed for cough, wheeze, or chest congestion  Follow up in 4 to 6 weeks with Dr. Halford Chessman or Nurse Practitioner    Chesley Mires, MD [facility] Pager:  [phone] 06/03/2016, 10:20 AM

## 2016-06-03 NOTE — Progress Notes (Signed)
Patient seen in the office today and instructed on use of Arnuity and Albuterol HFA.  Patient expressed understanding and demonstrated technique. Virl Cagey, CMA

## 2016-06-03 NOTE — Progress Notes (Signed)
   Subjective:    Patient ID: Amy Tran, female    DOB: [birth year], 61 y.o.   MRN: [id number]  HPI    Review of Systems  Constitutional: Negative for fever and unexpected weight change.  HENT: Negative for congestion, dental problem, ear pain, nosebleeds, postnasal drip, rhinorrhea, sinus pressure, sneezing, sore throat and trouble swallowing.   Eyes: Negative for redness and itching.  Respiratory: Positive for shortness of breath. Negative for cough, chest tightness and wheezing.   Cardiovascular: Positive for chest pain ( tightness). Negative for palpitations and leg swelling.  Gastrointestinal: Negative for nausea and vomiting.  Genitourinary: Negative for dysuria.  Musculoskeletal: Positive for joint swelling.  Skin: Negative for rash ( itching).  Neurological: Positive for headaches.  Hematological: Does not bruise/bleed easily.  Psychiatric/Behavioral: Negative for dysphoric mood. The patient is not nervous/anxious.        Objective:   Physical Exam        Assessment & Plan:

## 2016-06-03 NOTE — Patient Instructions (Signed)
Arnuity one puff daily  Ventolin two puffs every 4 to 6 hours as needed for cough, wheeze, or chest congestion  Follow up in 4 to 6 weeks with Dr. Halford Chessman or Nurse Practitioner

## 2016-06-24 ENCOUNTER — Ambulatory Visit (INDEPENDENT_AMBULATORY_CARE_PROVIDER_SITE_OTHER): Payer: Medicare Other | Admitting: Cardiology

## 2016-06-24 ENCOUNTER — Encounter: Payer: Self-pay | Admitting: Cardiology

## 2016-06-24 VITALS — BP 168/81 | HR 49 | Ht 64.0 in | Wt 303.4 lb

## 2016-06-24 DIAGNOSIS — R0602 Shortness of breath: Secondary | ICD-10-CM

## 2016-06-24 DIAGNOSIS — I10 Essential (primary) hypertension: Secondary | ICD-10-CM | POA: Diagnosis not present

## 2016-06-24 DIAGNOSIS — R600 Localized edema: Secondary | ICD-10-CM

## 2016-06-24 DIAGNOSIS — R079 Chest pain, unspecified: Secondary | ICD-10-CM

## 2016-06-24 NOTE — Progress Notes (Signed)
Clinical Summary Amy Tran is a 61 y.o.female  seen today for follow up of the following medical problems.   1. SOB - symptoms started about 2 months ago. Was doing some heavy lifting, felt sharp pain in chest midchest, 3/10 in severity. Not positional. Lasted for about 4 days. Seen in ER and negative evaluation. Shortly after developed SOB - DOE with house activities like sweeping, DOE at 1/2 block. Increased LE edema. No orthopnea. Recent weight gain. Normal weight around 279, up to 299 lbs.  - ER visit 01/19/16 with SOB. CT PE negative. EKG , trop neg x 2. CXR no acute process - nonspecific tingling pain left chest, lasts 3-4 minutes. Better with rubbing.  -01/2016 echo LVEF 60-65%, cannot evluate diastolic function but LA size is normal.  - 04/2016 Lexiscan MPI without ischemia., LVEF 55-65%, low risk.   - followed by pulmonary Dr Halford Chessman. Started on inhalers recently after abnormal PFTs thought to be asthma - takes toresmide 60mg  in AM. Chronic swelling remains unchaged.   2. HTN - has not taken meds yet today, reports overall compliance.   3. LE edema -likely related to obesity, perhaps venous insufficiency. Previous echo fairly mild findings - compliant with torsemide, takes 60mg  qAM  4. OSA screen - +snoring, unsure of apneic episodes, +daytime somnolence.  - Sleep study, being considered by pulmonary  5. Hyperlipidemia - compliant with statin, has been on simva for several years  Past Medical History:  Diagnosis Date  . Cancer [facility])    multiple skin cancers  . Diabetes mellitus without complication ([place])   . Fluid retention   . High cholesterol   . Hypertension   . Incomplete rotator cuff tear   . PONV (postoperative nausea and vomiting)   . Wears glasses   . Wears partial dentures    bottom     Allergies  Allergen Reactions  . Penicillins Hives    Has patient had a PCN reaction causing immediate rash, facial/tongue/throat swelling, SOB or  lightheadedness with hypotension: Yes Has patient had a PCN reaction causing severe rash involving mucus membranes or skin necrosis: No Has patient had a PCN reaction that required hospitalization No Has patient had a PCN reaction occurring within the last 10 years: No If all of the above answers are "NO", then may proceed with Cephalosporin use.      Current Outpatient Prescriptions  Medication Sig Dispense Refill  . albuterol (VENTOLIN HFA) 108 (90 Base) MCG/ACT inhaler Inhale 2 puffs into the lungs every 6 (six) hours as needed for wheezing or shortness of breath. 1 Inhaler 5  . amLODipine (NORVASC) 5 MG tablet Take 5 mg by mouth daily.    Marland Kitchen aspirin EC 81 MG tablet Take 81 mg by mouth daily.    . canagliflozin (INVOKANA) 300 MG TABS tablet Take 300 mg by mouth daily before breakfast.    . CVS MAGNESIUM OXIDE 500 MG TABS Take 1 tablet by mouth daily.  3  . Cyanocobalamin (VITAMIN B12 PO) Take 1 tablet by mouth daily.    . Fluticasone Furoate (ARNUITY ELLIPTA) 100 MCG/ACT AEPB Inhale 100 mcg into the lungs daily. 30 each 5  . hydrOXYzine (ATARAX/VISTARIL) 25 MG tablet Take 25 mg by mouth every 6 (six) hours as needed.    Marland Kitchen losartan (COZAAR) 100 MG tablet Take 100 mg by mouth daily.    . metFORMIN (GLUCOPHAGE) 1000 MG tablet Take 1,000 mg by mouth 2 (two) times daily with a meal.     .  mirtazapine (REMERON) 15 MG tablet Take 15 mg by mouth at bedtime.    . simvastatin (ZOCOR) 20 MG tablet Take 20 mg by mouth daily at 6 PM.     . spironolactone (ALDACTONE) 25 MG tablet Take 25 mg by mouth daily.    Marland Kitchen torsemide (DEMADEX) 20 MG tablet TAKE 2 TABS IN AM AND 1 TAB IN PM 90 tablet 3   No current facility-administered medications for this visit.      Past Surgical History:  Procedure Laterality Date  . APPENDECTOMY    . CARPAL TUNNEL RELEASE  2001   left  . CHOLECYSTECTOMY    . COLONOSCOPY  2007   [id number] internal hemorrhoids. Diminutive rectal polyp at 10 cm, cold biopsied/removed.  The remainder of the rectal mucosa appeared normal Swallow left-sided diverticula. Diminutive polyp at the splenic flexure cold biopsied/removed (adenomatous)  . COLONOSCOPY N/A 12/28/2013   Procedure: COLONOSCOPY;  Surgeon: Daneil Dolin, MD;  Location: [facility];  Service: Endoscopy;  Laterality: N/A;  730 - moved to 8:30 - Ginger notified pt  . ERCP  2002  . GASTRIC BYPASS  2004  . NECK SURGERY  1998  . SHOULDER ARTHROSCOPY WITH SUBACROMIAL DECOMPRESSION Left 06/23/2013   Procedure: LEFT SHOULDER ARTHROSCOPY WITH DEBRIDEMENT ROTATOR CUFF AND LABRUM;  Surgeon: Lorn Junes, MD;  Location: [facility];  Service: Orthopedics;  Laterality: Left;  . SHOULDER SURGERY  1999   left     Allergies  Allergen Reactions  . Penicillins Hives    Has patient had a PCN reaction causing immediate rash, facial/tongue/throat swelling, SOB or lightheadedness with hypotension: Yes Has patient had a PCN reaction causing severe rash involving mucus membranes or skin necrosis: No Has patient had a PCN reaction that required hospitalization No Has patient had a PCN reaction occurring within the last 10 years: No If all of the above answers are "NO", then may proceed with Cephalosporin use.       Family History  Problem Relation Age of Onset  . Diabetes Father   . Hypertension Father   . Parkinson's disease Father   . Cancer Father        not sure what kind  . Hypertension Mother   . Heart disease Mother   . Drug abuse Sister   . Mesothelioma Brother   . Cancer Daughter        non-hodgkins lmphoma  . Hypertension Brother   . Diabetes Brother   . Colon cancer Neg Hx      Social History Ms. Claros reports that she quit smoking about 14 years ago. Her smoking use included Cigarettes. She started smoking about 46 years ago. She has a 29.00 pack-year smoking history. She has never used smokeless tobacco. Ms. Esham reports that she does not drink alcohol.   Review of  Systems CONSTITUTIONAL: No weight loss, fever, chills, weakness or fatigue.  HEENT: Eyes: No visual loss, blurred vision, double vision or yellow sclerae.No hearing loss, sneezing, congestion, runny nose or sore throat.  SKIN: No rash or itching.  CARDIOVASCULAR: per HPI RESPIRATORY: No shortness of breath, cough or sputum.  GASTROINTESTINAL: No anorexia, nausea, vomiting or diarrhea. No abdominal pain or blood.  GENITOURINARY: No burning on urination, no polyuria NEUROLOGICAL: No headache, dizziness, syncope, paralysis, ataxia, numbness or tingling in the extremities. No change in bowel or bladder control.  MUSCULOSKELETAL: No muscle, back pain, joint pain or stiffness.  LYMPHATICS: No enlarged nodes. No history of splenectomy.  PSYCHIATRIC: No  history of depression or anxiety.  ENDOCRINOLOGIC: No reports of sweating, cold or heat intolerance. No polyuria or polydipsia.  Marland Kitchen   Physical Examination Vitals:   06/24/16 0946  BP: (!) 168/81  Pulse: (!) 49   Vitals:   06/24/16 0946  Weight: (!) 303 lb 6.4 oz (137.6 kg)  Height: 5\' 4"  (1.626 m)    Gen: resting comfortably, no acute distress HEENT: no scleral icterus, pupils equal round and reactive, no palptable cervical adenopathy,  CV: RRR, 2/6 systolic murmur RUSB, no jvd Resp: Clear to auscultation bilaterally GI: abdomen is soft, non-tender, non-distended, normal bowel sounds, no hepatosplenomegaly MSK: extremities are warm, 1+ bilateral LE edema Skin: warm, no rash Neuro:  no focal deficits Psych: appropriate affect   Diagnostic Studies 01/2016 echo Study Conclusions  - Left ventricle: The cavity size was mildly dilated. Wall thickness was normal. Systolic function was normal. The estimated ejection fraction was in the range of 60% to 65%. The study is not technically sufficient to allow evaluation of LV diastolic function. - Pulmonary arteries: PA peak pressure: 32 mm Hg (S). - Pericardium, extracardiac: A  trivial pericardial effusion was identified.  04/2016 Lexiscan  There was no ST segment deviation noted during stress.  The study is normal. There are no perfusion defects consistent with prior infarct or current ischemia. Anterior fixed defect likely causes by breast attenuation.  This is a low risk study.  The left ventricular ejection fraction is normal (55-65%).  Assessment and Plan  1. SOB/Chest pain - negative cardiac workup including echo and nuclear stress test - no further cardiac workup at this time - f/u with pulmonary for abnormal PFTs. Encouraged increased exercise/conditioning.    2. HTN - elevated in clinic, however has not taken meds yet today -we will continue to monitor at this time.   3. LE edema - encouraged increased compliance with diuretics.  - likely related to obesity and venous insufficiency, fairly benign echo recently.   4. OSA screen - some signs and symptoms of OSA. Defer possible workup to pulmonary   F/u 6 months     Carlyle Dolly MD

## 2016-06-24 NOTE — Patient Instructions (Signed)

## 2016-06-26 DIAGNOSIS — E119 Type 2 diabetes mellitus without complications: Secondary | ICD-10-CM | POA: Diagnosis not present

## 2016-06-26 DIAGNOSIS — G4700 Insomnia, unspecified: Secondary | ICD-10-CM | POA: Diagnosis not present

## 2016-06-26 DIAGNOSIS — E785 Hyperlipidemia, unspecified: Secondary | ICD-10-CM | POA: Diagnosis not present

## 2016-06-26 DIAGNOSIS — Z79899 Other long term (current) drug therapy: Secondary | ICD-10-CM | POA: Diagnosis not present

## 2016-06-26 DIAGNOSIS — E876 Hypokalemia: Secondary | ICD-10-CM | POA: Diagnosis not present

## 2016-07-01 ENCOUNTER — Encounter: Payer: Self-pay | Admitting: Acute Care

## 2016-07-01 ENCOUNTER — Ambulatory Visit (INDEPENDENT_AMBULATORY_CARE_PROVIDER_SITE_OTHER): Payer: Medicare Other | Admitting: Acute Care

## 2016-07-01 VITALS — HR 44 | Ht 64.0 in | Wt 300.4 lb

## 2016-07-01 DIAGNOSIS — B370 Candidal stomatitis: Secondary | ICD-10-CM | POA: Insufficient documentation

## 2016-07-01 DIAGNOSIS — J45909 Unspecified asthma, uncomplicated: Secondary | ICD-10-CM | POA: Insufficient documentation

## 2016-07-01 DIAGNOSIS — R0602 Shortness of breath: Secondary | ICD-10-CM

## 2016-07-01 DIAGNOSIS — J4520 Mild intermittent asthma, uncomplicated: Secondary | ICD-10-CM

## 2016-07-01 MED ORDER — MAGIC MOUTHWASH W/LIDOCAINE: 5.0000 mL | Freq: Four times a day (QID) | ORAL | 0 refills | Status: DC

## 2016-07-01 MED ORDER — FLUTICASONE FUROATE 100 MCG/ACT IN AEPB: 100.0000 ug | INHALATION_SPRAY | Freq: Every day | RESPIRATORY_TRACT | 5 refills | Status: DC

## 2016-07-01 NOTE — Progress Notes (Signed)
History of Present Illness Amy Tran is a 61 y.o. female former smoker ( quit 2003) with dyspnea. She is followed by Dr. Halford Chessman.  HPI: Pt has bee  Followed by cardiology. She has noticed worsening dyspnea  For several months, especially with walking up stairs, and at time with rest. Her symptoms have come on gradually.  She will take about 5 to 10 minutes to recover. Pt. States symptoms incluse tight feeling in her chest, like she cannot get enough air.Symptoms did not include wheezing.She rarely has a cough, rarely has sputum production.She does get allergies in the Fall and less so in the Spring.  She gets itchy/watery eyes, runny nose, and has to clear her throat.  She had allergy testing years ago and was told she has allergies to mold, feathers, and dust.  She has a International aid/development worker.  She was never on allergy shots.She is from [place].  She worked in a [employer] as a teenager.  She currently works at the [employer].  She denies recent travel or sick exposures.  She denies family history of asthma, or COPD.  She denies history of lung clots. She has never been on inhalers before. She had PFT's recently which showed bronchodilator responsiveness and air trapping.  She denies snoring or trouble with her sleep.  07/01/2016 Follow Up OV:  PT. Presents for follow up. She is followed for allergic asthma. She was seen by Dr. Halford Chessman 06/03/2016. The plan at that time was to Start Arnuity one puff daily,with prn Ventolin as rescue. She returns today stating she feels much better. Her dyspnea with exertion has improved.She has not had to use her rescue inhaler at all since starting her Arnuity. She has had significant improvement with the addition of Arnuity, chest tightness has cleared.   We did discuss a  sleep study as she is at risk for OSA. She does not want to sleep away from home, therefore prefers not to have a split night study.She is agreeable to O&O.Marland Kitchen  Test Results:  Pulmonary tests CT angio  chest 01/19/16 >> mild atherosclerosis PFT 04/19/16 >> FEV1 2.40 (94%), FEV1% 77, TLC 5.34 (105%), DLCO 78%, RV 2.70 (134%), +BD FeNO 06/03/16 >> 11  Cardiac tests Echo 02/09/16 >> Ef 60 to 65%, PAS 32 mmHg  CBC Latest Ref Rng & Units 01/19/2016 12/24/2015 06/23/2013  WBC 4.0 - 10.5 K/uL 9.6 9.1 -  Hemoglobin 12.0 - 15.0 g/dL 13.6 14.1 11.6(L)  Hematocrit 36.0 - 46.0 % 42.2 43.0 -  Platelets 150 - 400 K/uL 260 258 -    BMP Latest Ref Rng & Units 01/19/2016 12/24/2015 06/22/2013  Glucose 65 - 99 mg/dL 114(H) 130(H) 145(H)  BUN 6 - 20 mg/dL 16 17 12   Creatinine 0.44 - 1.00 mg/dL 0.66 0.78 0.71  Sodium 135 - 145 mmol/L 139 139 141  Potassium 3.5 - 5.1 mmol/L 4.2 3.1(L) 3.2(L)  Chloride 101 - 111 mmol/L 107 102 97  CO2 22 - 32 mmol/L 28 30 32  Calcium 8.9 - 10.3 mg/dL 9.1 9.5 9.8     PFT    Component Value Date/Time   FEV1PRE 2.12 04/19/2016 0922   FEV1POST 2.40 04/19/2016 0922   FVCPRE 2.72 04/19/2016 0922   FVCPOST 3.13 04/19/2016 0922   TLC 5.34 04/19/2016 0922   DLCOUNC 19.12 04/19/2016 0922   PREFEV1FVCRT 78 04/19/2016 0922   PSTFEV1FVCRT 77 04/19/2016 0922    No results found.   Past medical hx Past Medical History:  Diagnosis Date  .  Cancer [facility])    multiple skin cancers  . Diabetes mellitus without complication ([place])   . Fluid retention   . High cholesterol   . Hypertension   . Incomplete rotator cuff tear   . PONV (postoperative nausea and vomiting)   . Wears glasses   . Wears partial dentures    bottom     Social History  Substance Use Topics  . Smoking status: Former Smoker    Packs/day: 1.00    Years: 29.00    Types: Cigarettes    Start date: 04/09/1970    Quit date: 12/01/2001  . Smokeless tobacco: Never Used  . Alcohol use No    Tobacco Cessation: Former smoker quit 14 years ago with a 29 pack year smoking history.  Past surgical hx, Family hx, Social hx all reviewed.  Current Outpatient Prescriptions on File Prior to Visit  Medication  Sig  . albuterol (VENTOLIN HFA) 108 (90 Base) MCG/ACT inhaler Inhale 2 puffs into the lungs every 6 (six) hours as needed for wheezing or shortness of breath.  Marland Kitchen amLODipine (NORVASC) 5 MG tablet Take 5 mg by mouth daily.  Marland Kitchen aspirin EC 81 MG tablet Take 81 mg by mouth daily.  . canagliflozin (INVOKANA) 300 MG TABS tablet Take 300 mg by mouth daily before breakfast.  . [facility] MAGNESIUM OXIDE 500 MG TABS Take 1 tablet by mouth daily.  . Cyanocobalamin (VITAMIN B12 PO) Take 1 tablet by mouth daily.  . hydrOXYzine (ATARAX/VISTARIL) 25 MG tablet Take 25 mg by mouth every 6 (six) hours as needed.  Marland Kitchen losartan (COZAAR) 100 MG tablet Take 100 mg by mouth daily.  . metFORMIN (GLUCOPHAGE) 1000 MG tablet Take 1,000 mg by mouth 2 (two) times daily with a meal.   . mirtazapine (REMERON) 15 MG tablet Take 15 mg by mouth at bedtime.  . simvastatin (ZOCOR) 20 MG tablet Take 20 mg by mouth daily at 6 PM.   . spironolactone (ALDACTONE) 25 MG tablet Take 25 mg by mouth daily.  Marland Kitchen torsemide (DEMADEX) 20 MG tablet TAKE 2 TABS IN AM AND 1 TAB IN PM   No current facility-administered medications on file prior to visit.      Allergies  Allergen Reactions  . Penicillins Hives    Has patient had a PCN reaction causing immediate rash, facial/tongue/throat swelling, SOB or lightheadedness with hypotension: Yes Has patient had a PCN reaction causing severe rash involving mucus membranes or skin necrosis: No Has patient had a PCN reaction that required hospitalization No Has patient had a PCN reaction occurring within the last 10 years: No If all of the above answers are "NO", then may proceed with Cephalosporin use.     Review Of Systems:  Constitutional:   No  weight loss, night sweats,  Fevers, chills, fatigue, or  lassitude.  HEENT:   No headaches,  Difficulty swallowing,  Tooth/dental problems, or  Sore throat,                No sneezing, itching, ear ache, nasal congestion, post nasal drip,   CV:  No chest  pain,  Orthopnea, PND, swelling in lower extremities, anasarca, dizziness, palpitations, syncope.   GI  No heartburn, indigestion, abdominal pain, nausea, vomiting, diarrhea, change in bowel habits, loss of appetite, bloody stools.   Resp:Improving  shortness of breath with exertion none  at rest.  No excess mucus, no productive cough,  No non-productive cough,  No coughing up of blood.  No change in color of  mucus.  No wheezing.  No chest wall deformity  Skin: no rash or lesions.  GU: no dysuria, change in color of urine, no urgency or frequency.  No flank pain, no hematuria   MS:  No joint pain or swelling.  No decreased range of motion.  No back pain.  Psych:  No change in mood or affect. No depression or anxiety.  No memory loss.   Vital Signs Pulse (!) 44   Ht 5\' 4"  (1.626 m)   Wt (!) 300 lb 6.4 oz (136.3 kg)   SpO2 95%   BMI 51.56 kg/m    Physical Exam:  General- No distress,  A&Ox3, pleasant ENT: No sinus tenderness, TM clear, pale nasal mucosa, no oral exudate,no post nasal drip, no LAN Cardiac: S1, S2, regular rate and rhythm, no murmur Chest: No wheeze/ rales/ dullness; no accessory muscle use, no nasal flaring, no sternal retractions Abd.: Soft Non-tender, obese Ext: No clubbing cyanosis, edema Neuro:  Deconditioned at baseline Skin: No rashes, warm and dry Psych: normal mood and behavior, pleasant and appropriate   Assessment/Plan  Extrinsic asthma without complication Mild Extrinsic Persistent asthma Significant improvement of dyspnea with addition of Arnuity Plan: Continue your Arnuity 1 puff once daily. Continue rinsing mouth after use. Magic Mouthwash 5 cc's 4 times daily x 1 week. Add Biotene mouthwash ( Over the counter) for mouth dryness. Try a Baking Soda based toothpaste. We will schedule you for an Overnight sleep study at home. We will reconsider allergy testing at 6 month follow up. Follow up with Dr. Halford Chessman in 6 months. Please contact office  for sooner follow up if symptoms do not improve or worsen or seek emergency care    Oral thrush Oral Thrush with use of Arnuity Dry Mouth Plan Continue your Arnuity 1 puff once daily. Continue rinsing mouth after use. Magic Mouthwash 5 cc's 4 times daily x 1 week. Add Biotene mouthwash ( Over the counter) for mouth dryness. Try a Baking Soda based toothpaste. We will schedule you for an Overnight sleep study at home. We will call you with results We will reconsider allergy testing at 6 month follow up. Follow up with Dr. Halford Chessman in 6 months. Please contact office for sooner follow up if symptoms do not improve or worsen or seek emergency care      Magdalen Spatz, NP 07/01/2016  3:26 PM

## 2016-07-01 NOTE — Assessment & Plan Note (Signed)
Mild Extrinsic Persistent asthma Significant improvement of dyspnea with addition of Arnuity Plan: Continue your Arnuity 1 puff once daily. Continue rinsing mouth after use. Magic Mouthwash 5 cc's 4 times daily x 1 week. Add Biotene mouthwash ( Over the counter) for mouth dryness. Try a Baking Soda based toothpaste. We will schedule you for an Overnight sleep study at home. We will reconsider allergy testing at 6 month follow up. Follow up with Dr. Halford Chessman in 6 months. Please contact office for sooner follow up if symptoms do not improve or worsen or seek emergency care

## 2016-07-01 NOTE — Assessment & Plan Note (Addendum)
Oral Thrush with use of Arnuity Dry Mouth Plan Continue your Arnuity 1 puff once daily. Continue rinsing mouth after use. Magic Mouthwash 5 cc's 4 times daily x 1 week. Add Biotene mouthwash ( Over the counter) for mouth dryness. Try a Baking Soda based toothpaste. We will schedule you for an Overnight sleep study at home. We will call you with results We will reconsider allergy testing at 6 month follow up. Follow up with Dr. Halford Chessman in 6 months. Please contact office for sooner follow up if symptoms do not improve or worsen or seek emergency care

## 2016-07-01 NOTE — Patient Instructions (Signed)
It is nice to meet you. Continue your Arnuity 1 puff once daily. Continue rinsing mouth after use. Magic Mouthwash 5 cc's 4 times daily x 1 week. Add Biotene mouthwash ( Over the counter) for mouth dryness. Try a Baking Soda based toothpaste. We will schedule you for an Overnight sleep study at home. We will reconsider allergy testing at 6 month follow up. Follow up with Dr. Halford Chessman in 6 months. Please contact office for sooner follow up if symptoms do not improve or worsen or seek emergency care

## 2016-07-02 NOTE — Progress Notes (Signed)
I have reviewed and agree with assessment/plan.  Chesley Mires, MD [facility] Pulmonary/Critical Care 07/02/2016, 2:52 PM Pager:  [phone]

## 2016-07-10 DIAGNOSIS — E785 Hyperlipidemia, unspecified: Secondary | ICD-10-CM | POA: Diagnosis not present

## 2016-07-10 DIAGNOSIS — E1129 Type 2 diabetes mellitus with other diabetic kidney complication: Secondary | ICD-10-CM | POA: Diagnosis not present

## 2016-07-10 DIAGNOSIS — E876 Hypokalemia: Secondary | ICD-10-CM | POA: Diagnosis not present

## 2016-07-30 ENCOUNTER — Telehealth: Payer: Self-pay | Admitting: Acute Care

## 2016-07-30 NOTE — Telephone Encounter (Signed)
Please call patient and her know that her overnight sleep study did not indicate that she needs oxygen with sleep. She did not have significant desaturations per the O&O test Let her know that we can further discuss this at her 6 month follow-up. Thank you

## 2016-07-31 ENCOUNTER — Telehealth: Payer: Self-pay | Admitting: Acute Care

## 2016-07-31 NOTE — Telephone Encounter (Signed)
lmtcb x1 for pt.    Magdalen Spatz, NP      4:35 PM  Note    Please call patient and her know that her overnight sleep study did not indicate that she needs oxygen with sleep. She did not have significant desaturations per the O&O test Let her know that we can further discuss this at her 6 month follow-up. Thank you

## 2016-07-31 NOTE — Telephone Encounter (Signed)
ATC pt, no answer. Left message for pt to call back.  

## 2016-08-01 NOTE — Telephone Encounter (Signed)
Pt returned phone call..ert ° ° °

## 2016-08-01 NOTE — Telephone Encounter (Signed)
Spoke with pt. She is aware of results. Nothing further was needed.  

## 2016-08-06 ENCOUNTER — Encounter: Payer: Self-pay | Admitting: Podiatry

## 2016-08-06 ENCOUNTER — Ambulatory Visit (INDEPENDENT_AMBULATORY_CARE_PROVIDER_SITE_OTHER): Payer: Medicare Other | Admitting: Podiatry

## 2016-08-06 ENCOUNTER — Ambulatory Visit (INDEPENDENT_AMBULATORY_CARE_PROVIDER_SITE_OTHER): Payer: Medicare Other

## 2016-08-06 DIAGNOSIS — M79671 Pain in right foot: Secondary | ICD-10-CM | POA: Diagnosis not present

## 2016-08-06 DIAGNOSIS — R52 Pain, unspecified: Secondary | ICD-10-CM | POA: Diagnosis not present

## 2016-08-06 DIAGNOSIS — E1142 Type 2 diabetes mellitus with diabetic polyneuropathy: Secondary | ICD-10-CM

## 2016-08-06 DIAGNOSIS — M79672 Pain in left foot: Secondary | ICD-10-CM | POA: Diagnosis not present

## 2016-08-06 DIAGNOSIS — S93602A Unspecified sprain of left foot, initial encounter: Secondary | ICD-10-CM

## 2016-08-06 MED ORDER — MELOXICAM 15 MG PO TABS: 15.0000 mg | ORAL_TABLET | Freq: Every day | ORAL | 0 refills | Status: DC

## 2016-08-07 NOTE — Progress Notes (Signed)
   Subjective:    Patient ID: Amy Tran, female    DOB: 10-12-1955, 61 y.o.   MRN: [id number]  HPI this patient presents the office with chief complaint of painful feet.  She states that she experiences sharp, radiating pain through the outside of both feet with her left foot worse than her right foot. She says the pain comes and goes.  She also says she experiences itchiness and burning in her foot. This patient is a diabetic on metformin. Patient denies any history of trauma or injury to the foot. She has not provided any self treatment nor sought any professional help. She says her pain in her left foot is not painful today but some days it is severe.  She presents the office today for an evaluation and treatment of her painful feet    Review of Systems  All other systems reviewed and are negative.      Objective:   Physical Exam GENERAL APPEARANCE: Alert, conversant. Appropriately groomed. No acute distress.  VASCULAR: Pedal pulses are  palpable at  [facility] and PT bilateral.  Capillary refill time is immediate to all digits,  Normal temperature gradient.  Digital hair growth is present bilateral  NEUROLOGIC: sensation is normal to 5.07 monofilament at 5/5 sites bilateral.  Light touch is intact bilateral, Muscle strength normal.  MUSCULOSKELETAL: acceptable muscle strength, tone and stability bilateral.  Intrinsic muscluature intact bilateral.  Rectus appearance of foot and digits noted bilateral. Swelling feet/legs  B/L.  Pain noted at the fifth metabase/ cuboid joint.  Minimal pain noted today but patient points to this area as point of shooting pain.  DERMATOLOGIC: skin color, texture, and turgor are within normal limits.  No preulcerative lesions or ulcers  are seen, no interdigital maceration noted.  No open lesions present.  Digital nails are asymptomatic. No drainage noted.         Assessment & Plan:  Foot Sprain B/L  Cuboid syndrome  B/L  IE  Xrays reveal no bony pathology  at cuboid articulations.  Discussed this condition with this patient.  We have chose to wear power step insoles to limit the excess motion in her foot.  She was also called in a prescription of Mobic to be taken daily.  Return to the clinic in 3 weeks for further evaluation and treatment   Gardiner Barefoot DPM

## 2016-08-08 NOTE — Telephone Encounter (Signed)
See 6/20 phone note.

## 2016-08-29 ENCOUNTER — Ambulatory Visit: Payer: Medicare Other | Admitting: Podiatry

## 2016-09-03 ENCOUNTER — Ambulatory Visit: Payer: Medicare Other | Admitting: Podiatry

## 2016-09-05 ENCOUNTER — Other Ambulatory Visit: Payer: Self-pay | Admitting: Podiatry

## 2016-09-09 DIAGNOSIS — N644 Mastodynia: Secondary | ICD-10-CM | POA: Diagnosis not present

## 2016-09-10 ENCOUNTER — Other Ambulatory Visit (HOSPITAL_COMMUNITY): Payer: Self-pay | Admitting: Internal Medicine

## 2016-09-10 DIAGNOSIS — R52 Pain, unspecified: Secondary | ICD-10-CM

## 2016-09-17 ENCOUNTER — Ambulatory Visit (HOSPITAL_COMMUNITY): Payer: Medicare Other

## 2016-09-17 ENCOUNTER — Ambulatory Visit (HOSPITAL_COMMUNITY)
Admission: RE | Admit: 2016-09-17 | Discharge: 2016-09-17 | Disposition: A | Discharge status: Home / Self Care | Payer: Medicare Other | Source: Ambulatory Visit | Attending: Internal Medicine | Admitting: Internal Medicine

## 2016-09-17 DIAGNOSIS — R52 Pain, unspecified: Secondary | ICD-10-CM

## 2016-09-17 DIAGNOSIS — N644 Mastodynia: Secondary | ICD-10-CM | POA: Insufficient documentation

## 2016-09-17 DIAGNOSIS — R928 Other abnormal and inconclusive findings on diagnostic imaging of breast: Secondary | ICD-10-CM | POA: Diagnosis not present

## 2016-09-17 DIAGNOSIS — N6489 Other specified disorders of breast: Secondary | ICD-10-CM | POA: Diagnosis not present

## 2016-09-19 ENCOUNTER — Ambulatory Visit: Payer: Medicare Other | Admitting: Podiatry

## 2016-09-26 ENCOUNTER — Ambulatory Visit (INDEPENDENT_AMBULATORY_CARE_PROVIDER_SITE_OTHER): Payer: Medicare Other | Admitting: Podiatry

## 2016-09-26 DIAGNOSIS — S93602D Unspecified sprain of left foot, subsequent encounter: Secondary | ICD-10-CM

## 2016-09-26 NOTE — Progress Notes (Signed)
This patient returns to the office 3 weeks after initially being seen and diagnosed with a cuboid syndrome and a foot sprain bilaterally. She was treated with multiple thick and power step insoles. She presents the office today stating that she's 90% improved and is very pleased.  She says she wears her power step insoles in her shoes and takes the medicine as prescribed.  She returns the office today for continued evaluation and treatment of her feet   GENERAL APPEARANCE: Alert, conversant. Appropriately groomed. No acute distress.  VASCULAR: Pedal pulses are  palpable at  [facility] and PT bilateral.  Capillary refill time is immediate to all digits,  Normal temperature gradient.  Digital hair growth is present bilateral  NEUROLOGIC: sensation is normal to 5.07 monofilament at 5/5 sites bilateral.  Light touch is intact bilateral, Muscle strength normal.  MUSCULOSKELETAL: acceptable muscle strength, tone and stability bilateral.  Intrinsic muscluature intact bilateral.  Rectus appearance of foot and digits noted bilateral. No pain at the cuboid no feet.  DERMATOLOGIC: skin color, texture, and turgor are within normal limits.  No preulcerative lesions or ulcers  are seen, no interdigital maceration noted.  No open lesions present.  Digital nails are asymptomatic. No drainage noted.  S/P cuboid syndrome feet  B/l   ROV  patient was called told to continue wearing power step insoles and take the Mobic as needed.  Return to the clinic when necessary   Gardiner Barefoot DPM

## 2016-09-30 DIAGNOSIS — H40013 Open angle with borderline findings, low risk, bilateral: Secondary | ICD-10-CM | POA: Diagnosis not present

## 2016-09-30 DIAGNOSIS — H40003 Preglaucoma, unspecified, bilateral: Secondary | ICD-10-CM | POA: Insufficient documentation

## 2016-10-02 DIAGNOSIS — E785 Hyperlipidemia, unspecified: Secondary | ICD-10-CM | POA: Diagnosis not present

## 2016-10-02 DIAGNOSIS — E875 Hyperkalemia: Secondary | ICD-10-CM | POA: Diagnosis not present

## 2016-10-02 DIAGNOSIS — Z79899 Other long term (current) drug therapy: Secondary | ICD-10-CM | POA: Diagnosis not present

## 2016-10-02 DIAGNOSIS — E119 Type 2 diabetes mellitus without complications: Secondary | ICD-10-CM | POA: Diagnosis not present

## 2016-10-11 DIAGNOSIS — R2243 Localized swelling, mass and lump, lower limb, bilateral: Secondary | ICD-10-CM | POA: Diagnosis not present

## 2016-10-11 DIAGNOSIS — E876 Hypokalemia: Secondary | ICD-10-CM | POA: Diagnosis not present

## 2016-10-11 DIAGNOSIS — E1129 Type 2 diabetes mellitus with other diabetic kidney complication: Secondary | ICD-10-CM | POA: Diagnosis not present

## 2016-10-15 ENCOUNTER — Telehealth: Payer: Self-pay | Admitting: Acute Care

## 2016-10-15 MED ORDER — NYSTATIN 100000 UNIT/ML MT SUSP: 5.0000 mL | Freq: Four times a day (QID) | OROMUCOSAL | 0 refills | Status: DC

## 2016-10-15 NOTE — Telephone Encounter (Signed)
rx sent to preferred pharmacy.  Pt aware.  Nothing further needed.  

## 2016-10-15 NOTE — Telephone Encounter (Signed)
Pt c/o thrush- has worsened X1 day.  Verified that pt is only using Arnuity daily and rinses mouth after use.  Pt has tried gargling salt water to help with thrush but is not helping s/s.  Requesting something to be [facility] in to [facility] [place].  VS please advise on requested rx.  Thanks!

## 2016-10-15 NOTE — Telephone Encounter (Signed)
Please send nystatin swish and swallow 100,000 units/ml, 5 ml four times daily.

## 2016-10-16 ENCOUNTER — Emergency Department (HOSPITAL_COMMUNITY)
Admission: EM | Admit: 2016-10-16 | Discharge: 2016-10-16 | Disposition: A | Discharge status: Home / Self Care | Payer: Medicare Other | Attending: Emergency Medicine | Admitting: Emergency Medicine

## 2016-10-16 ENCOUNTER — Encounter (HOSPITAL_COMMUNITY): Payer: Self-pay

## 2016-10-16 ENCOUNTER — Emergency Department (HOSPITAL_COMMUNITY): Payer: Medicare Other

## 2016-10-16 DIAGNOSIS — F17210 Nicotine dependence, cigarettes, uncomplicated: Secondary | ICD-10-CM | POA: Insufficient documentation

## 2016-10-16 DIAGNOSIS — R109 Unspecified abdominal pain: Secondary | ICD-10-CM | POA: Diagnosis not present

## 2016-10-16 DIAGNOSIS — Z7982 Long term (current) use of aspirin: Secondary | ICD-10-CM | POA: Insufficient documentation

## 2016-10-16 DIAGNOSIS — Z9884 Bariatric surgery status: Secondary | ICD-10-CM | POA: Insufficient documentation

## 2016-10-16 DIAGNOSIS — M79604 Pain in right leg: Secondary | ICD-10-CM | POA: Diagnosis not present

## 2016-10-16 DIAGNOSIS — R1010 Upper abdominal pain, unspecified: Secondary | ICD-10-CM | POA: Diagnosis present

## 2016-10-16 DIAGNOSIS — R1084 Generalized abdominal pain: Secondary | ICD-10-CM | POA: Diagnosis not present

## 2016-10-16 DIAGNOSIS — E119 Type 2 diabetes mellitus without complications: Secondary | ICD-10-CM | POA: Insufficient documentation

## 2016-10-16 DIAGNOSIS — M79661 Pain in right lower leg: Secondary | ICD-10-CM | POA: Diagnosis not present

## 2016-10-16 DIAGNOSIS — I10 Essential (primary) hypertension: Secondary | ICD-10-CM | POA: Diagnosis not present

## 2016-10-16 DIAGNOSIS — Z79899 Other long term (current) drug therapy: Secondary | ICD-10-CM | POA: Insufficient documentation

## 2016-10-16 DIAGNOSIS — R1110 Vomiting, unspecified: Secondary | ICD-10-CM | POA: Diagnosis not present

## 2016-10-16 DIAGNOSIS — Z7984 Long term (current) use of oral hypoglycemic drugs: Secondary | ICD-10-CM | POA: Diagnosis not present

## 2016-10-16 HISTORY — DX: Diverticulosis of intestine, part unspecified, without perforation or abscess without bleeding: K57.90

## 2016-10-16 LAB — URINALYSIS, ROUTINE W REFLEX MICROSCOPIC
BACTERIA UA: NONE SEEN
BILIRUBIN URINE: NEGATIVE
Glucose, UA: >=500 mg/dL — AB
HGB URINE DIPSTICK: NEGATIVE
KETONES UR: NEGATIVE mg/dL
Leukocytes, UA: NEGATIVE
NITRITE: NEGATIVE
PROTEIN: 100 mg/dL — AB
Specific Gravity, Urine: 1.038 — ABNORMAL HIGH (ref 1.005–1.030)
pH: 6 (ref 5.0–8.0)

## 2016-10-16 LAB — CBC WITH DIFFERENTIAL/PLATELET
BASOS PCT: 0 %
Basophils Absolute: 0 10*3/uL (ref 0.0–0.1)
Eosinophils Absolute: 0.2 10*3/uL (ref 0.0–0.7)
Eosinophils Relative: 2 %
HEMATOCRIT: 44.4 % (ref 36.0–46.0)
HEMOGLOBIN: 14.8 g/dL (ref 12.0–15.0)
LYMPHS ABS: 1.8 10*3/uL (ref 0.7–4.0)
Lymphocytes Relative: 18 %
MCH: 28.8 pg (ref 26.0–34.0)
MCHC: 33.3 g/dL (ref 30.0–36.0)
MCV: 86.5 fL (ref 78.0–100.0)
MONOS PCT: 10 %
Monocytes Absolute: 1 10*3/uL (ref 0.1–1.0)
NEUTROS ABS: 6.9 10*3/uL (ref 1.7–7.7)
Neutrophils Relative %: 70 %
Platelets: 291 10*3/uL (ref 150–400)
RBC: 5.13 MIL/uL — AB (ref 3.87–5.11)
RDW: 14.8 % (ref 11.5–15.5)
WBC: 9.8 10*3/uL (ref 4.0–10.5)

## 2016-10-16 LAB — COMPREHENSIVE METABOLIC PANEL
ALBUMIN: 4 g/dL (ref 3.5–5.0)
ALK PHOS: 61 U/L (ref 38–126)
ALT: 17 U/L (ref 14–54)
ANION GAP: 10 (ref 5–15)
AST: 16 U/L (ref 15–41)
BUN: 14 mg/dL (ref 6–20)
CO2: 26 mmol/L (ref 22–32)
Calcium: 9.5 mg/dL (ref 8.9–10.3)
Chloride: 99 mmol/L — ABNORMAL LOW (ref 101–111)
Creatinine, Ser: 0.72 mg/dL (ref 0.44–1.00)
GFR calc Af Amer: >60 mL/min (ref >60)
GFR calc non Af Amer: >60 mL/min (ref >60)
Glucose, Bld: 157 mg/dL — ABNORMAL HIGH (ref 65–99)
Potassium: 3.1 mmol/L — ABNORMAL LOW (ref 3.5–5.1)
SODIUM: 135 mmol/L (ref 135–145)
Total Bilirubin: 1.1 mg/dL (ref 0.3–1.2)
Total Protein: 7.3 g/dL (ref 6.5–8.1)

## 2016-10-16 LAB — I-STAT TROPONIN, ED: Troponin i, poc: 0.03 ng/mL (ref 0.00–0.08)

## 2016-10-16 LAB — LIPASE, BLOOD: LIPASE: 44 U/L (ref 11–51)

## 2016-10-16 MED ORDER — IOPAMIDOL (ISOVUE-300) INJECTION 61%
100.0000 mL | Freq: Once | INTRAVENOUS | Status: AC | PRN
  Administered 2016-10-16: 100 mL via INTRAVENOUS

## 2016-10-16 MED ORDER — ONDANSETRON 4 MG PO TBDP: 4.0000 mg | ORAL_TABLET | Freq: Three times a day (TID) | ORAL | 0 refills | Status: DC | PRN

## 2016-10-16 MED ORDER — FAMOTIDINE 20 MG PO TABS: 20.0000 mg | ORAL_TABLET | Freq: Two times a day (BID) | ORAL | 0 refills | Status: DC

## 2016-10-16 MED ORDER — FAMOTIDINE IN NACL 20-0.9 MG/50ML-% IV SOLN
20.0000 mg | Freq: Once | INTRAVENOUS | Status: AC
Start: 2016-10-16 — End: 2016-10-16
  Administered 2016-10-16: 20 mg via INTRAVENOUS
  Filled 2016-10-16: qty 50

## 2016-10-16 MED ORDER — POTASSIUM CHLORIDE CRYS ER 20 MEQ PO TBCR
40.0000 meq | EXTENDED_RELEASE_TABLET | Freq: Once | ORAL | Status: AC
  Administered 2016-10-16: 40 meq via ORAL
  Filled 2016-10-16: qty 2

## 2016-10-16 MED ORDER — ONDANSETRON HCL 4 MG/2ML IJ SOLN
4.0000 mg | INTRAMUSCULAR | Status: DC | PRN
  Administered 2016-10-16: 4 mg via INTRAVENOUS
  Filled 2016-10-16: qty 2

## 2016-10-16 NOTE — ED Provider Notes (Signed)
[facility] DEPT Provider Note   CSN: [id number] Arrival date & time: 10/16/16  1145     History   Chief Complaint Chief Complaint  Patient presents with  . Abdominal Pain  . Leg Pain    HPI Amy Tran is a 61 y.o. female.  HPI  Pt was seen at 1210.  Per pt, c/o gradual onset and persistence of constant upper generalized abd "pain" since yesterday.  Has been associated with multiple intermittent episodes of N/V.  Describes the abd pain as "cramping." Pt also c/o right upper leg "pain" for the past 1 week. Pt was evaluated by her PMD last week for this complaint and was told "it was just a pulled muscle."  Pt states the pain began "after I been walking a lot."  Pt states she "doesn't believe" her doctor. Denies diarrhea, no fevers, no back pain, no rash, no CP/SOB, no black or blood in stools, no back pain, no joint pain, no injury, no focal motor weakness, no tingling/numbness in extremities.      Past Medical History:  Diagnosis Date  . Cancer [facility])    multiple skin cancers  . Diabetes mellitus without complication ([place])   . Diverticulosis   . Fluid retention   . High cholesterol   . Hypertension   . Incomplete rotator cuff tear   . PONV (postoperative nausea and vomiting)   . Wears glasses   . Wears partial dentures    bottom    Patient Active Problem List   Diagnosis Date Noted  . Extrinsic asthma without complication 69/67/8938  . Oral thrush 07/01/2016  . Rectocele 09/19/2015  . Diverticulosis of colon without hemorrhage   . Hx of adenomatous colonic polyps 12/01/2013  . Pain in joint, shoulder region 06/24/2013  . Muscle weakness (generalized) 06/24/2013  . Incomplete rotator cuff tear   . Diabetes mellitus without complication ([place])   . PONV (postoperative nausea and vomiting)   . Fluid retention   . Hypertension     Past Surgical History:  Procedure Laterality Date  . APPENDECTOMY    . CARPAL TUNNEL RELEASE  2001   left  . CHOLECYSTECTOMY     . COLONOSCOPY  2007   BOF:BPZWCHE internal hemorrhoids. Diminutive rectal polyp at 10 cm, cold biopsied/removed. The remainder of the rectal mucosa appeared normal Swallow left-sided diverticula. Diminutive polyp at the splenic flexure cold biopsied/removed (adenomatous)  . COLONOSCOPY N/A 12/28/2013   Procedure: COLONOSCOPY;  Surgeon: Daneil Dolin, MD;  [facility];  Service: Endoscopy;  Laterality: N/A;  730 - moved to 8:30 - Ginger notified pt  . ERCP  2002  . GASTRIC BYPASS  2004  . NECK SURGERY  1998  . SHOULDER ARTHROSCOPY WITH SUBACROMIAL DECOMPRESSION Left 06/23/2013   Procedure: LEFT SHOULDER ARTHROSCOPY WITH DEBRIDEMENT ROTATOR CUFF AND LABRUM;  Surgeon: Lorn Junes, MD;  Location: [place];  Service: Orthopedics;  Laterality: Left;  . SHOULDER SURGERY  1999   left    OB History    No data available       Home Medications    Prior to Admission medications   Medication Sig Start Date End Date Taking? Authorizing Provider  albuterol (VENTOLIN HFA) 108 (90 Base) MCG/ACT inhaler Inhale 2 puffs into the lungs every 6 (six) hours as needed for wheezing or shortness of breath. 06/03/16   Chesley Mires, MD  amLODipine (NORVASC) 5 MG tablet Take 5 mg by mouth daily.    [provider]  aspirin EC 81 MG tablet Take 81 mg by mouth daily.    [provider]  canagliflozin (INVOKANA) 300 MG TABS tablet Take 300 mg by mouth daily before breakfast.    [provider]  [employer] MAGNESIUM OXIDE 500 MG TABS Take 1 tablet by mouth daily. 05/26/14   [provider]  Cyanocobalamin (VITAMIN B12 PO) Take 1 tablet by mouth daily.    [provider]  Fluticasone Furoate (ARNUITY ELLIPTA) 100 MCG/ACT AEPB Inhale 100 mcg into the lungs daily. 07/01/16   Magdalen Spatz, NP  hydrOXYzine (ATARAX/VISTARIL) 25 MG tablet Take 25 mg by mouth every 6 (six) hours as needed.    [provider]  losartan (COZAAR) 100 MG tablet Take  100 mg by mouth daily.    [provider]  magic mouthwash w/lidocaine SOLN Take 5 mLs by mouth 4 (four) times daily. 07/01/16   Magdalen Spatz, NP  meloxicam (MOBIC) 15 MG tablet Take 1 tablet (15 mg total) by mouth daily. 08/06/16   Gardiner Barefoot, DPM  metFORMIN (GLUCOPHAGE) 1000 MG tablet Take 1,000 mg by mouth 2 (two) times daily with a meal.     [provider]  mirtazapine (REMERON) 15 MG tablet Take 15 mg by mouth at bedtime.    [provider]  nystatin (MYCOSTATIN) 100000 UNIT/ML suspension Take 5 mLs (500,000 Units total) by mouth 4 (four) times daily. 10/15/16   Chesley Mires, MD  simvastatin (ZOCOR) 20 MG tablet Take 20 mg by mouth daily at 6 PM.  07/29/14   [provider]  spironolactone (ALDACTONE) 25 MG tablet Take 25 mg by mouth daily.    [provider]  torsemide (DEMADEX) 20 MG tablet TAKE 2 TABS IN AM AND 1 TAB IN PM 02/02/16   Branch, Alphonse Guild, MD    Family History Family History  Problem Relation Age of Onset  . Diabetes Father   . Hypertension Father   . Parkinson's disease Father   . Cancer Father        not sure what kind  . Hypertension Mother   . Heart disease Mother   . Drug abuse Sister   . Mesothelioma Brother   . Cancer Daughter        non-hodgkins lmphoma  . Hypertension Brother   . Diabetes Brother   . Colon cancer Neg Hx     Social History Social History  Substance Use Topics  . Smoking status: Current Every Day Smoker    Packs/day: 1.00    Years: 29.00    Types: Cigarettes    Start date: 04/09/1970    Last attempt to quit: 12/01/2001  . Smokeless tobacco: Never Used  . Alcohol use No     Allergies   Penicillins   Review of Systems Review of Systems ROS: Statement: All systems negative except as marked or noted in the HPI; Constitutional: Negative for fever and chills. ; ; Eyes: Negative for eye pain, redness and discharge. ; ; ENMT: Negative for ear pain, hoarseness, nasal congestion, sinus  pressure and sore throat. ; ; Cardiovascular: Negative for chest pain, palpitations, diaphoresis, dyspnea and peripheral edema. ; ; Respiratory: Negative for cough, wheezing and stridor. ; ; Gastrointestinal: +abd pain, N/V. Negative for diarrhea, blood in stool, hematemesis, jaundice and rectal bleeding. . ; ; Genitourinary: Negative for dysuria, flank pain and hematuria. ; ; Musculoskeletal: +RLE pain. Negative for back pain and neck pain. Negative for swelling and trauma.; ; Skin: Negative for pruritus, rash,  abrasions, blisters, bruising and skin lesion.; ; Neuro: Negative for headache, lightheadedness and neck stiffness. Negative for weakness, altered level of consciousness, altered mental status, extremity weakness, paresthesias, involuntary movement, seizure and syncope.       Physical Exam Updated Vital Signs BP (!) 213/84 (BP Location: Left Wrist)   Pulse 63   Temp 98.2 F (36.8 C) (Oral)   Resp 16   Ht 5\' 4"  (1.626 m)   Wt 126.3 kg (278 lb 6.4 oz)   SpO2 96%   BMI 47.79 kg/m   BP (!) 156/61   Pulse (!) 43   Temp 98.2 F (36.8 C) (Oral)   Resp 16   Ht 5\' 4"  (1.626 m)   Wt 126.3 kg (278 lb 6.4 oz)   SpO2 98%   BMI 47.79 kg/m    Physical Exam 1215: Physical examination:  Nursing notes reviewed; Vital signs and O2 SAT reviewed;  Constitutional: Well developed, Well nourished, Well hydrated, In no acute distress; Head:  Normocephalic, atraumatic; Eyes: EOMI, PERRL, No scleral icterus; ENMT: Mouth and pharynx normal, Mucous membranes moist; Neck: Supple, Full range of motion, No lymphadenopathy; Cardiovascular: Regular rate and rhythm, No gallop; Respiratory: Breath sounds clear & equal bilaterally, No wheezes.  Speaking full sentences with ease, Normal respiratory effort/excursion; Chest: Nontender, Movement normal; Abdomen: Soft, +TTP RUQ, mid-epigastric, LUQ areas, no rebound or guarding. Nondistended, Normal bowel sounds; Genitourinary: No CVA tenderness; Spine:  No midline  CS, TS, LS tenderness.;; Extremities: Pulses normal, Pelvis stable. NT right hip/knee/ankle/foot. RLE muscles compartments soft. +mild TTP along right IT band. No deformity, No edema, No calf edema or asymmetry.; Neuro: AA&Ox3, Major CN grossly intact.  Speech clear. No gross focal motor or sensory deficits in extremities.; Skin: Color normal, Warm, Dry.   ED Treatments / Results  Labs (all labs ordered are listed, but only abnormal results are displayed)   EKG  EKG Interpretation  Date/Time:  Wednesday October 16 2016 15:31:50 EDT Ventricular Rate:  58 PR Interval:    QRS Duration: 120 QT Interval:  463 QTC Calculation: 455 R Axis:   45 Text Interpretation:  Sinus rhythm Premature atrial complexes Nonspecific intraventricular conduction delay When compared with ECG of 01/19/2016 No significant change was found Confirmed by Francine Graven [phone]) on 10/16/2016 3:51:38 PM       Radiology   Procedures Procedures (including critical care time)  Medications Ordered in ED Medications  famotidine (PEPCID) IVPB 20 mg premix (not administered)  ondansetron (ZOFRAN) injection 4 mg (not administered)     Initial Impression / Assessment and Plan / ED Course  I have reviewed the triage vital signs and the nursing notes.  Pertinent labs & imaging results that were available during my care of the patient were reviewed by me and considered in my medical decision making (see chart for details).  MDM Reviewed: previous chart, nursing note and vitals Reviewed previous: labs Interpretation: labs, ultrasound and CT scan   Results for orders placed or performed during the hospital encounter of 10/16/16  CBC with Differential  Result Value Ref Range   WBC 9.8 4.0 - 10.5 K/uL   RBC 5.13 (H) 3.87 - 5.11 MIL/uL   Hemoglobin 14.8 12.0 - 15.0 g/dL   HCT 44.4 36.0 - 46.0 %   MCV 86.5 78.0 - 100.0 fL   MCH 28.8 26.0 - 34.0 pg   MCHC 33.3 30.0 - 36.0 g/dL   RDW 14.8 11.5 - 15.5 %    Platelets 291 150 - 400 K/uL  Neutrophils Relative % 70 %   Neutro Abs 6.9 1.7 - 7.7 K/uL   Lymphocytes Relative 18 %   Lymphs Abs 1.8 0.7 - 4.0 K/uL   Monocytes Relative 10 %   Monocytes Absolute 1.0 0.1 - 1.0 K/uL   Eosinophils Relative 2 %   Eosinophils Absolute 0.2 0.0 - 0.7 K/uL   Basophils Relative 0 %   Basophils Absolute 0.0 0.0 - 0.1 K/uL  Comprehensive metabolic panel  Result Value Ref Range   Sodium 135 135 - 145 mmol/L   Potassium 3.1 (L) 3.5 - 5.1 mmol/L   Chloride 99 (L) 101 - 111 mmol/L   CO2 26 22 - 32 mmol/L   Glucose, Bld 157 (H) 65 - 99 mg/dL   BUN 14 6 - 20 mg/dL   Creatinine, Ser 0.72 0.44 - 1.00 mg/dL   Calcium 9.5 8.9 - 10.3 mg/dL   Total Protein 7.3 6.5 - 8.1 g/dL   Albumin 4.0 3.5 - 5.0 g/dL   AST 16 15 - 41 U/L   ALT 17 14 - 54 U/L   Alkaline Phosphatase 61 38 - 126 U/L   Total Bilirubin 1.1 0.3 - 1.2 mg/dL   GFR calc non Af Amer >60 >60 mL/min   GFR calc Af Amer >60 >60 mL/min   Anion gap 10 5 - 15  Urinalysis, Routine w reflex microscopic  Result Value Ref Range   Color, Urine AMBER (A) YELLOW   APPearance HAZY (A) CLEAR   Specific Gravity, Urine 1.038 (H) 1.005 - 1.030   pH 6.0 5.0 - 8.0   Glucose, UA >=500 (A) NEGATIVE mg/dL   Hgb urine dipstick NEGATIVE NEGATIVE   Bilirubin Urine NEGATIVE NEGATIVE   Ketones, ur NEGATIVE NEGATIVE mg/dL   Protein, ur 100 (A) NEGATIVE mg/dL   Nitrite NEGATIVE NEGATIVE   Leukocytes, UA NEGATIVE NEGATIVE   RBC / HPF 6-30 0 - 5 RBC/hpf   WBC, UA 0-5 0 - 5 WBC/hpf   Bacteria, UA NONE SEEN NONE SEEN   Squamous Epithelial / LPF 6-30 (A) NONE SEEN   Mucus PRESENT   Lipase, blood  Result Value Ref Range   Lipase 44 11 - 51 U/L  I-stat troponin, ED  Result Value Ref Range   Troponin i, poc 0.03 0.00 - 0.08 ng/mL   Comment 3           Ct Abdomen Pelvis W Contrast Result Date: 10/16/2016 CLINICAL DATA:  Right-sided abdominal pain with vomiting.  Diabetes EXAM: CT ABDOMEN AND PELVIS WITH CONTRAST TECHNIQUE:  Multidetector CT imaging of the abdomen and pelvis was performed using the standard protocol following bolus administration of intravenous contrast. CONTRAST:  176mL ISOVUE-300 IOPAMIDOL (ISOVUE-300) INJECTION 61% COMPARISON:  None. FINDINGS: Lower chest: Negative Hepatobiliary: Cholecystectomy. Bile ducts nondilated. No focal liver lesion. Pancreas: Negative Spleen: Negative Adrenals/Urinary Tract: Negative for renal obstruction or stone. No renal mass. 19 mm left adrenal nodule with soft tissue density. Stomach/Bowel: Gastric bypass surgery. Negative for bowel obstruction. Negative for bowel mass or edema. Normal appendix. Negative for diverticulitis. Vascular/Lymphatic: Atherosclerotic aorta without aneurysm Reproductive: Normal uterus.  Negative for pelvic mass Other: None Musculoskeletal: No acute skeletal abnormality. IMPRESSION: No cause for acute abdominal pain. Postop gastric bypass without bowel obstruction Normal appendix 19 mm left adrenal nodule, possibly adenoma. Electronically Signed   By: Franchot Gallo M.D.   On: 10/16/2016 14:01   US Venous Img Lower Unilateral Right Result Date: 10/16/2016 CLINICAL DATA:  Initial evaluation for acute right lower extremity  pain for 1 week. EXAM: Right LOWER EXTREMITY VENOUS DOPPLER ULTRASOUND TECHNIQUE: Gray-scale sonography with graded compression, as well as color Doppler and duplex ultrasound were performed to evaluate the lower extremity deep venous systems from the level of the common femoral vein and including the common femoral, femoral, profunda femoral, popliteal and calf veins including the posterior tibial, peroneal and gastrocnemius veins when visible. The superficial great saphenous vein was also interrogated. Spectral Doppler was utilized to evaluate flow at rest and with distal augmentation maneuvers in the common femoral, femoral and popliteal veins. COMPARISON:  None. FINDINGS: Contralateral Common Femoral Vein: Respiratory phasicity is normal  and symmetric with the symptomatic side. No evidence of thrombus. Normal compressibility. Common Femoral Vein: No evidence of thrombus. Normal compressibility, respiratory phasicity and response to augmentation. Saphenofemoral Junction: No evidence of thrombus. Normal compressibility and flow on color Doppler imaging. Profunda Femoral Vein: No evidence of thrombus. Normal compressibility and flow on color Doppler imaging. Femoral Vein: No evidence of thrombus. Normal compressibility, respiratory phasicity and response to augmentation. Popliteal Vein: No evidence of thrombus. Normal compressibility, respiratory phasicity and response to augmentation. Calf Veins: No evidence of thrombus. Normal compressibility and flow on color Doppler imaging. Superficial Great Saphenous Vein: No evidence of thrombus. Normal compressibility and flow on color Doppler imaging. Venous Reflux:  None. Other Findings:  None. IMPRESSION: No evidence of DVT within the right lower extremity. Electronically Signed   By: Jeannine Boga M.D.   On: 10/16/2016 13:23    5035:  Udip contaminated. Potassium repleted PO. Pt has tol PO well while in the ED without N/V. Workup reassuring. Pt feels better and wants to go home now. Tx symptomatically at this time.  Dx and testing d/w pt and family.  Questions answered.  Verb understanding, agreeable to d/c home with outpt f/u.   Final Clinical Impressions(s) / ED Diagnoses   Final diagnoses:  None    New Prescriptions New Prescriptions   No medications on file     Francine Graven, DO 10/21/16 1525

## 2016-10-16 NOTE — ED Triage Notes (Signed)
Pt c/o r sided abd pain and vomiting since Monday.  Denies any diarrhea.  Reports r leg pain since Friday.  Reports her pcp told her she had a pulled muscle in her leg.  LBM  Was this morning and was normal per pt.

## 2016-10-16 NOTE — ED Notes (Signed)
Pt gone to xray

## 2016-10-16 NOTE — ED Notes (Signed)
ED Provider at bedside. 

## 2016-10-16 NOTE — ED Notes (Signed)
Patient back from CT. Unable to complete CT due to labs not resulting. Will try when labs come back.

## 2016-10-16 NOTE — Discharge Instructions (Signed)
Eat a bland diet, avoiding greasy, fatty, fried foods, as well as spicy and acidic foods or beverages.  Avoid eating within the hour or 2 before going to bed or laying down.  Also avoid teas, colas, coffee, chocolate, pepermint and spearment.Take the prescriptions as directed. Apply moist heat or ice to the area(s) of discomfort, for 15 minutes at a time, several times per day for the next few days.  Do not fall asleep on a heating or ice pack.  Call your regular medical doctor tomorrow to schedule a follow up appointment in the next 3 days.  Return to the Emergency Department immediately if worsening.

## 2016-10-18 ENCOUNTER — Telehealth: Payer: Self-pay | Admitting: Internal Medicine

## 2016-10-18 ENCOUNTER — Emergency Department (HOSPITAL_COMMUNITY)
Admission: EM | Admit: 2016-10-18 | Discharge: 2016-10-18 | Disposition: A | Discharge status: Home / Self Care | Payer: Medicare Other | Attending: Emergency Medicine | Admitting: Emergency Medicine

## 2016-10-18 ENCOUNTER — Encounter (HOSPITAL_COMMUNITY): Payer: Self-pay | Admitting: Emergency Medicine

## 2016-10-18 ENCOUNTER — Emergency Department (HOSPITAL_COMMUNITY): Payer: Medicare Other

## 2016-10-18 DIAGNOSIS — Z7982 Long term (current) use of aspirin: Secondary | ICD-10-CM | POA: Diagnosis not present

## 2016-10-18 DIAGNOSIS — E119 Type 2 diabetes mellitus without complications: Secondary | ICD-10-CM | POA: Diagnosis not present

## 2016-10-18 DIAGNOSIS — Z791 Long term (current) use of non-steroidal anti-inflammatories (NSAID): Secondary | ICD-10-CM | POA: Insufficient documentation

## 2016-10-18 DIAGNOSIS — R1011 Right upper quadrant pain: Secondary | ICD-10-CM | POA: Diagnosis not present

## 2016-10-18 DIAGNOSIS — R109 Unspecified abdominal pain: Secondary | ICD-10-CM | POA: Diagnosis not present

## 2016-10-18 DIAGNOSIS — I10 Essential (primary) hypertension: Secondary | ICD-10-CM | POA: Diagnosis not present

## 2016-10-18 DIAGNOSIS — Z79899 Other long term (current) drug therapy: Secondary | ICD-10-CM | POA: Insufficient documentation

## 2016-10-18 DIAGNOSIS — R1084 Generalized abdominal pain: Secondary | ICD-10-CM | POA: Diagnosis not present

## 2016-10-18 DIAGNOSIS — F17210 Nicotine dependence, cigarettes, uncomplicated: Secondary | ICD-10-CM | POA: Insufficient documentation

## 2016-10-18 DIAGNOSIS — R1013 Epigastric pain: Secondary | ICD-10-CM | POA: Diagnosis not present

## 2016-10-18 DIAGNOSIS — Z7984 Long term (current) use of oral hypoglycemic drugs: Secondary | ICD-10-CM | POA: Insufficient documentation

## 2016-10-18 LAB — COMPREHENSIVE METABOLIC PANEL
ALT: 20 U/L (ref 14–54)
AST: 21 U/L (ref 15–41)
Albumin: 3.9 g/dL (ref 3.5–5.0)
Alkaline Phosphatase: 59 U/L (ref 38–126)
Anion gap: 8 (ref 5–15)
BUN: 15 mg/dL (ref 6–20)
CHLORIDE: 99 mmol/L — AB (ref 101–111)
CO2: 29 mmol/L (ref 22–32)
CREATININE: 0.86 mg/dL (ref 0.44–1.00)
Calcium: 9.5 mg/dL (ref 8.9–10.3)
Glucose, Bld: 133 mg/dL — ABNORMAL HIGH (ref 65–99)
POTASSIUM: 3.9 mmol/L (ref 3.5–5.1)
SODIUM: 136 mmol/L (ref 135–145)
Total Bilirubin: 1.1 mg/dL (ref 0.3–1.2)
Total Protein: 7.2 g/dL (ref 6.5–8.1)

## 2016-10-18 LAB — URINALYSIS, ROUTINE W REFLEX MICROSCOPIC
BACTERIA UA: NONE SEEN
Bilirubin Urine: NEGATIVE
Glucose, UA: >=500 mg/dL — AB
Hgb urine dipstick: NEGATIVE
KETONES UR: NEGATIVE mg/dL
LEUKOCYTES UA: NEGATIVE
Nitrite: NEGATIVE
PH: 6 (ref 5.0–8.0)
PROTEIN: 30 mg/dL — AB
Specific Gravity, Urine: 1.032 — ABNORMAL HIGH (ref 1.005–1.030)

## 2016-10-18 LAB — TROPONIN I: Troponin I: <0.03 ng/mL (ref <0.03)

## 2016-10-18 LAB — CBC
HEMATOCRIT: 50 % — AB (ref 36.0–46.0)
Hemoglobin: 16.8 g/dL — ABNORMAL HIGH (ref 12.0–15.0)
MCH: 29.2 pg (ref 26.0–34.0)
MCHC: 33.6 g/dL (ref 30.0–36.0)
MCV: 86.8 fL (ref 78.0–100.0)
Platelets: 333 10*3/uL (ref 150–400)
RBC: 5.76 MIL/uL — ABNORMAL HIGH (ref 3.87–5.11)
RDW: 14.8 % (ref 11.5–15.5)
WBC: 13.6 10*3/uL — AB (ref 4.0–10.5)

## 2016-10-18 LAB — LIPASE, BLOOD: LIPASE: 38 U/L (ref 11–51)

## 2016-10-18 MED ORDER — METOCLOPRAMIDE HCL 5 MG/ML IJ SOLN
INTRAMUSCULAR | Status: AC
  Filled 2016-10-18: qty 2

## 2016-10-18 MED ORDER — METOCLOPRAMIDE HCL 5 MG/ML IJ SOLN: 10.0000 mg | Freq: Once | INTRAMUSCULAR | Status: DC

## 2016-10-18 MED ORDER — METOCLOPRAMIDE HCL 5 MG/ML IJ SOLN
10.0000 mg | Freq: Once | INTRAMUSCULAR | Status: AC
  Administered 2016-10-18: 10 mg via INTRAVENOUS
  Filled 2016-10-18: qty 2

## 2016-10-18 MED ORDER — METOCLOPRAMIDE HCL 10 MG PO TABS: 10.0000 mg | ORAL_TABLET | Freq: Four times a day (QID) | ORAL | 0 refills | Status: DC | PRN

## 2016-10-18 MED ORDER — SODIUM CHLORIDE 0.9 % IV BOLUS (SEPSIS)
1000.0000 mL | Freq: Once | INTRAVENOUS | Status: AC
  Administered 2016-10-18: 1000 mL via INTRAVENOUS

## 2016-10-18 MED ORDER — DICYCLOMINE HCL 10 MG/ML IM SOLN
20.0000 mg | Freq: Once | INTRAMUSCULAR | Status: AC
  Administered 2016-10-18: 20 mg via INTRAMUSCULAR
  Filled 2016-10-18: qty 2

## 2016-10-18 NOTE — Telephone Encounter (Signed)
Needs ov as outlined. Will follow up on er evaluations.

## 2016-10-18 NOTE — ED Provider Notes (Signed)
[facility] DEPT Provider Note   CSN: [id number] Arrival date & time: 10/18/16  1100     History   Chief Complaint Chief Complaint  Patient presents with  . Abdominal Pain  . Nausea    HPI Amy Tran is a 61 y.o. female.  HPI  Pt was seen at 1210.  Per pt, c/o gradual onset and persistence of constant generalized abd "pain" for the past 1 week.  Pt was having several episodes of N/V, but this has resolved. Describes the abd pain as "sharp."  Denies vomiting/diarrhea, no fevers, no back pain, no rash, no CP/SOB, no black or blood in stools. Pt was evaluated in the ED 2 days ago with reassuring workup (including CT scan) and was referred to GI MD.       Past Medical History:  Diagnosis Date  . Cancer [facility])    multiple skin cancers  . Diabetes mellitus without complication ([place])   . Diverticulosis   . Fluid retention   . High cholesterol   . Hypertension   . Incomplete rotator cuff tear   . PONV (postoperative nausea and vomiting)   . Wears glasses   . Wears partial dentures    bottom    Patient Active Problem List   Diagnosis Date Noted  . Extrinsic asthma without complication 43/15/4008  . Oral thrush 07/01/2016  . Rectocele 09/19/2015  . Diverticulosis of colon without hemorrhage   . Hx of adenomatous colonic polyps 12/01/2013  . Pain in joint, shoulder region 06/24/2013  . Muscle weakness (generalized) 06/24/2013  . Incomplete rotator cuff tear   . Diabetes mellitus without complication ([place])   . PONV (postoperative nausea and vomiting)   . Fluid retention   . Hypertension     Past Surgical History:  Procedure Laterality Date  . APPENDECTOMY    . CARPAL TUNNEL RELEASE  2001   left  . CHOLECYSTECTOMY    . COLONOSCOPY  2007   [id number] internal hemorrhoids. Diminutive rectal polyp at 10 cm, cold biopsied/removed. The remainder of the rectal mucosa appeared normal Swallow left-sided diverticula. Diminutive polyp at the splenic flexure cold  biopsied/removed (adenomatous)  . COLONOSCOPY N/A 12/28/2013   Procedure: COLONOSCOPY;  Surgeon: Daneil Dolin, MD;  Location: [facility];  Service: Endoscopy;  Laterality: N/A;  730 - moved to 8:30 - Ginger notified pt  . ERCP  2002  . GASTRIC BYPASS  2004  . NECK SURGERY  1998  . SHOULDER ARTHROSCOPY WITH SUBACROMIAL DECOMPRESSION Left 06/23/2013   Procedure: LEFT SHOULDER ARTHROSCOPY WITH DEBRIDEMENT ROTATOR CUFF AND LABRUM;  Surgeon: Lorn Junes, MD;  Location: [place];  Service: Orthopedics;  Laterality: Left;  . SHOULDER SURGERY  1999   left    OB History    No data available       Home Medications    Prior to Admission medications   Medication Sig Start Date End Date Taking? Authorizing Provider  acetaminophen (TYLENOL) 500 MG tablet Take 1,000 mg by mouth every 6 (six) hours as needed.    [provider]  albuterol (VENTOLIN HFA) 108 (90 Base) MCG/ACT inhaler Inhale 2 puffs into the lungs every 6 (six) hours as needed for wheezing or shortness of breath. 06/03/16   Chesley Mires, MD  amLODipine (NORVASC) 5 MG tablet Take 5 mg by mouth daily.    [provider]  aspirin EC 81 MG tablet Take 81 mg by mouth daily.    [provider]  canagliflozin (  INVOKANA) 300 MG TABS tablet Take 300 mg by mouth daily before breakfast.    [provider]  [employer] MAGNESIUM OXIDE 500 MG TABS Take 1 tablet by mouth daily. 05/26/14   [provider]  Cyanocobalamin (VITAMIN B12 PO) Take 1 tablet by mouth daily.    [provider]  famotidine (PEPCID) 20 MG tablet Take 1 tablet (20 mg total) by mouth 2 (two) times daily. 10/16/16   Francine Graven, DO  Fluticasone Furoate (ARNUITY ELLIPTA) 100 MCG/ACT AEPB Inhale 100 mcg into the lungs daily. 07/01/16   Magdalen Spatz, NP  losartan (COZAAR) 100 MG tablet Take 100 mg by mouth daily.    [provider]  magic mouthwash w/lidocaine SOLN Take 5 mLs by mouth 4 (four) times  daily. 07/01/16   Magdalen Spatz, NP  meloxicam (MOBIC) 15 MG tablet Take 1 tablet (15 mg total) by mouth daily. 08/06/16   Gardiner Barefoot, DPM  metFORMIN (GLUCOPHAGE) 1000 MG tablet Take 1,000 mg by mouth 2 (two) times daily with a meal.     [provider]  mirtazapine (REMERON) 15 MG tablet Take 15 mg by mouth at bedtime.    [provider]  nystatin (MYCOSTATIN) 100000 UNIT/ML suspension Take 5 mLs (500,000 Units total) by mouth 4 (four) times daily. 10/15/16   Chesley Mires, MD  ondansetron (ZOFRAN ODT) 4 MG disintegrating tablet Take 1 tablet (4 mg total) by mouth every 8 (eight) hours as needed for nausea or vomiting. 10/16/16   Francine Graven, DO  simvastatin (ZOCOR) 20 MG tablet Take 20 mg by mouth daily at 6 PM.  07/29/14   [provider]  spironolactone (ALDACTONE) 25 MG tablet Take 25 mg by mouth daily.    [provider]  torsemide (DEMADEX) 20 MG tablet TAKE 2 TABS IN AM AND 1 TAB IN PM 02/02/16   Branch, Alphonse Guild, MD    Family History Family History  Problem Relation Age of Onset  . Diabetes Father   . Hypertension Father   . Parkinson's disease Father   . Cancer Father        not sure what kind  . Hypertension Mother   . Heart disease Mother   . Drug abuse Sister   . Mesothelioma Brother   . Cancer Daughter        non-hodgkins lmphoma  . Hypertension Brother   . Diabetes Brother   . Colon cancer Neg Hx     Social History Social History  Substance Use Topics  . Smoking status: Current Every Day Smoker    Packs/day: 1.00    Years: 29.00    Types: Cigarettes    Start date: 04/09/1970    Last attempt to quit: 12/01/2001  . Smokeless tobacco: Never Used  . Alcohol use No     Allergies   Penicillins   Review of Systems Review of Systems ROS: Statement: All systems negative except as marked or noted in the HPI; Constitutional: Negative for fever and chills. ; ; Eyes: Negative for eye pain, redness and discharge. ; ; ENMT:  Negative for ear pain, hoarseness, nasal congestion, sinus pressure and sore throat. ; ; Cardiovascular: Negative for chest pain, palpitations, diaphoresis, dyspnea and peripheral edema. ; ; Respiratory: Negative for cough, wheezing and stridor. ; ; Gastrointestinal: +nausea, abd pain. Negative for vomiting, diarrhea, blood in stool, hematemesis, jaundice and rectal bleeding. . ; ; Genitourinary: Negative for dysuria, flank pain and hematuria. ; ; Musculoskeletal: Negative for back pain and  neck pain. Negative for swelling and trauma.; ; Skin: Negative for pruritus, rash, abrasions, blisters, bruising and skin lesion.; ; Neuro: Negative for headache, lightheadedness and neck stiffness. Negative for weakness, altered level of consciousness, altered mental status, extremity weakness, paresthesias, involuntary movement, seizure and syncope.       Physical Exam Updated Vital Signs BP (!) 160/74 (BP Location: Left Arm)   Pulse (S) (!) 48   Temp 98.2 F (36.8 C) (Oral)   Resp 18   Ht 5\' 4"  (1.626 m)   Wt 124.3 kg (274 lb)   SpO2 96%   BMI 47.03 kg/m   Physical Exam 1215: Physical examination:  Nursing notes reviewed; Vital signs and O2 SAT reviewed;  Constitutional: Well developed, Well nourished, Well hydrated, In no acute distress; Head:  Normocephalic, atraumatic; Eyes: EOMI, PERRL, No scleral icterus; ENMT: Mouth and pharynx normal, Mucous membranes moist; Neck: Supple, Full range of motion, No lymphadenopathy; Cardiovascular: Regular rate and rhythm, No gallop; Respiratory: Breath sounds clear & equal bilaterally, No wheezes.  Speaking full sentences with ease, Normal respiratory effort/excursion; Chest: Nontender, Movement normal; Abdomen: Soft, Nontender to palp. Nondistended, Normal bowel sounds; Genitourinary: No CVA tenderness; Extremities: Pulses normal, No tenderness, No edema, No calf edema or asymmetry.; Neuro: AA&Ox3, Major CN grossly intact.  Speech clear. No gross focal motor or  sensory deficits in extremities.; Skin: Color normal, Warm, Dry.   ED Treatments / Results  Labs (all labs ordered are listed, but only abnormal results are displayed)   EKG  EKG Interpretation  Date/Time:  Friday October 18 2016 11:34:17 EDT Ventricular Rate:  54 PR Interval:  148 QRS Duration: 78 QT Interval:  450 QTC Calculation: 426 R Axis:   -10 Text Interpretation:  Sinus bradycardia with Premature atrial complexes in a pattern of bigeminy Left ventricular hypertrophy Abnormal ECG since last tracing no significant change Confirmed by Noemi Chapel [phone]) on 10/18/2016 11:52:00 AM       Radiology   Procedures Procedures (including critical care time)  Medications Ordered in ED Medications  metoCLOPramide (REGLAN) 5 MG/ML injection (not administered)  dicyclomine (BENTYL) injection 20 mg (20 mg Intramuscular Given 10/18/16 1222)  sodium chloride 0.9 % bolus 1,000 mL (1,000 mLs Intravenous New Bag/Given 10/18/16 1224)  metoCLOPramide (REGLAN) injection 10 mg (10 mg Intravenous Given 10/18/16 1222)     Initial Impression / Assessment and Plan / ED Course  I have reviewed the triage vital signs and the nursing notes.  Pertinent labs & imaging results that were available during my care of the patient were reviewed by me and considered in my medical decision making (see chart for details).  MDM Reviewed: previous chart, nursing note and vitals Reviewed previous: labs, ECG, CT scan and ultrasound Interpretation: labs, ECG and x-ray   Results for orders placed or performed during the hospital encounter of 10/18/16  CBC  Result Value Ref Range   WBC 13.6 (H) 4.0 - 10.5 K/uL   RBC 5.76 (H) 3.87 - 5.11 MIL/uL   Hemoglobin 16.8 (H) 12.0 - 15.0 g/dL   HCT 50.0 (H) 36.0 - 46.0 %   MCV 86.8 78.0 - 100.0 fL   MCH 29.2 26.0 - 34.0 pg   MCHC 33.6 30.0 - 36.0 g/dL   RDW 14.8 11.5 - 15.5 %   Platelets 333 150 - 400 K/uL  Urinalysis, Routine w reflex microscopic  Result Value  Ref Range   Color, Urine YELLOW YELLOW   APPearance HAZY (A) CLEAR   Specific Gravity, Urine  1.032 (H) 1.005 - 1.030   pH 6.0 5.0 - 8.0   Glucose, UA >=500 (A) NEGATIVE mg/dL   Hgb urine dipstick NEGATIVE NEGATIVE   Bilirubin Urine NEGATIVE NEGATIVE   Ketones, ur NEGATIVE NEGATIVE mg/dL   Protein, ur 30 (A) NEGATIVE mg/dL   Nitrite NEGATIVE NEGATIVE   Leukocytes, UA NEGATIVE NEGATIVE   RBC / HPF 6-30 0 - 5 RBC/hpf   WBC, UA 0-5 0 - 5 WBC/hpf   Bacteria, UA NONE SEEN NONE SEEN   Squamous Epithelial / LPF 6-30 (A) NONE SEEN  Troponin I  Result Value Ref Range   Troponin I <0.03 <0.03 ng/mL  Comprehensive metabolic panel  Result Value Ref Range   Sodium 136 135 - 145 mmol/L   Potassium 3.9 3.5 - 5.1 mmol/L   Chloride 99 (L) 101 - 111 mmol/L   CO2 29 22 - 32 mmol/L   Glucose, Bld 133 (H) 65 - 99 mg/dL   BUN 15 6 - 20 mg/dL   Creatinine, Ser 0.86 0.44 - 1.00 mg/dL   Calcium 9.5 8.9 - 10.3 mg/dL   Total Protein 7.2 6.5 - 8.1 g/dL   Albumin 3.9 3.5 - 5.0 g/dL   AST 21 15 - 41 U/L   ALT 20 14 - 54 U/L   Alkaline Phosphatase 59 38 - 126 U/L   Total Bilirubin 1.1 0.3 - 1.2 mg/dL   GFR calc non Af Amer >60 >60 mL/min   GFR calc Af Amer >60 >60 mL/min   Anion gap 8 5 - 15  Lipase, blood  Result Value Ref Range   Lipase 38 11 - 51 U/L   Ct Abdomen Pelvis W Contrast Result Date: 10/16/2016 CLINICAL DATA:  Right-sided abdominal pain with vomiting.  Diabetes EXAM: CT ABDOMEN AND PELVIS WITH CONTRAST TECHNIQUE: Multidetector CT imaging of the abdomen and pelvis was performed using the standard protocol following bolus administration of intravenous contrast. CONTRAST:  131mL ISOVUE-300 IOPAMIDOL (ISOVUE-300) INJECTION 61% COMPARISON:  None. FINDINGS: Lower chest: Negative Hepatobiliary: Cholecystectomy. Bile ducts nondilated. No focal liver lesion. Pancreas: Negative Spleen: Negative Adrenals/Urinary Tract: Negative for renal obstruction or stone. No renal mass. 19 mm left adrenal nodule with  soft tissue density. Stomach/Bowel: Gastric bypass surgery. Negative for bowel obstruction. Negative for bowel mass or edema. Normal appendix. Negative for diverticulitis. Vascular/Lymphatic: Atherosclerotic aorta without aneurysm Reproductive: Normal uterus.  Negative for pelvic mass Other: None Musculoskeletal: No acute skeletal abnormality. IMPRESSION: No cause for acute abdominal pain. Postop gastric bypass without bowel obstruction Normal appendix 19 mm left adrenal nodule, possibly adenoma. Electronically Signed   By: Franchot Gallo M.D.   On: 10/16/2016 14:01    Dg Abd Acute W/chest Result Date: 10/18/2016 CLINICAL DATA:  Chronic left upper quadrant abdominal pain. Nausea. Prior gastric bypass. EXAM: DG ABDOMEN ACUTE W/ 1V CHEST COMPARISON:  CT abdomen and pelvis 10/16/2016. Chest radiographs 01/17/2016. FINDINGS: The cardiomediastinal silhouette is within normal limits. The lungs are well inflated and clear. There is no evidence of pleural effusion or pneumothorax. No acute osseous abnormality is identified. There is no evidence of intraperitoneal free air. Suture material is noted in the left upper quadrant related to prior gastric bypass. Cholecystectomy clips are present as well. Gas is present in nondilated loops of small and large bowel without evidence of obstruction. IMPRESSION: Negative abdominal radiographs.  No acute cardiopulmonary disease. Electronically Signed   By: Logan Bores M.D.   On: 10/18/2016 13:40    1445:  Pt has tol PO well  while in the ED without N/V.  No stooling while in the ED.  Abd remains benign, VSS. Pt with normal CT 2 days ago, will not repeat today, as pt has normal VS and benign abd.  Pt states she feels better and wants to go home now. No clear indication for admission at this time. Tx symptomatically, f/u GI MD. Dx and testing d/w pt and family.  Questions answered.  Verb understanding, agreeable to d/c home with outpt f/u.    Final Clinical Impressions(s) / ED  Diagnoses   Final diagnoses:  None    New Prescriptions New Prescriptions   No medications on file     Francine Graven, DO 10/21/16 1549

## 2016-10-18 NOTE — ED Triage Notes (Signed)
Patient sent from Dr. Ria Comment office today for re-evaluation of abdominal pain. Patient was evaluated Wednesday for same. Pt states pain has increased today along with nausea. Last bowel movement was today. Denies vomiting, diarrhea, constipation.

## 2016-10-18 NOTE — Telephone Encounter (Signed)
Pt's daughter called to say that patient went to the ER on Wednesday and is still having severe abdominal pains and was told to follow up with [facility]. I told her that if it was severe pain that she needed to go back to the ER because I do not have any openings until the end of OCT and no one has cancelled off the schedule at the moment. I told her the ER has GI coverage if needed. She said that she would call her mother and tell her.

## 2016-10-18 NOTE — Telephone Encounter (Signed)
Noted. Pt currently in the ED.  We have not seen the pt since LSL saw her in 2015.  Please schedule ov, she will need to be seen in the office.

## 2016-10-18 NOTE — ED Notes (Signed)
Nurse dropped Syringe with Reglan on floor. Pulled new vial

## 2016-10-18 NOTE — Discharge Instructions (Signed)
Eat a bland diet, avoiding greasy, fatty, fried foods, as well as spicy and acidic foods or beverages.  Avoid eating within the hour or 2 before going to bed or laying down.  Also avoid teas, colas, coffee, chocolate, pepermint and spearment. Take the prescription as directed.Call your regular medical doctor and your GI doctor on Monday to schedule a follow up appointment next week.  Return to the Emergency Department immediately if worsening.

## 2016-10-21 ENCOUNTER — Encounter: Payer: Self-pay | Admitting: Nurse Practitioner

## 2016-10-21 ENCOUNTER — Ambulatory Visit (INDEPENDENT_AMBULATORY_CARE_PROVIDER_SITE_OTHER): Payer: Medicare Other | Admitting: Nurse Practitioner

## 2016-10-21 DIAGNOSIS — R1013 Epigastric pain: Secondary | ICD-10-CM

## 2016-10-21 DIAGNOSIS — K219 Gastro-esophageal reflux disease without esophagitis: Secondary | ICD-10-CM | POA: Diagnosis not present

## 2016-10-21 DIAGNOSIS — R109 Unspecified abdominal pain: Secondary | ICD-10-CM | POA: Insufficient documentation

## 2016-10-21 MED ORDER — SUCRALFATE 1 GM/10ML PO SUSP: 1.0000 g | Freq: Four times a day (QID) | ORAL | 1 refills | Status: DC | PRN

## 2016-10-21 MED ORDER — PANTOPRAZOLE SODIUM 40 MG PO TBEC: 40.0000 mg | DELAYED_RELEASE_TABLET | Freq: Every day | ORAL | 2 refills | Status: DC

## 2016-10-21 NOTE — Patient Instructions (Addendum)
1. Continue to avoid NSAIDs (ibuprofen, Motrin, Advil, Aleve, Naprosyn, naproxen). Continue to avoid aspirin powders (BC powder, Goody's powder). 2. I have sent an Protonix 40 mg to your pharmacy. Take this once a day, first thing in the morning, on an empty stomach. 3. I sent in liquid Carafate to your pharmacy. You can use this up to 4 times a day as needed for severe symptoms while the Protonix gets on board. 4. If liquid Carafate is too expensive call us and we can send in the pill form. You can crush the pill, mix it and a little bit of water to make a slurry/suspension and drink that. 5. Return for follow-up in 6 weeks. 6. Call if any worsening symptoms before then. 7. Call if you have any questions or concerns.     Food Choices for Gastroesophageal Reflux Disease, Adult When you have gastroesophageal reflux disease (GERD), the foods you eat and your eating habits are very important. Choosing the right foods can help ease the discomfort of GERD. Consider working with a diet and nutrition specialist (dietitian) to help you make healthy food choices. What general guidelines should I follow? Eating plan  Choose healthy foods low in fat, such as fruits, vegetables, whole grains, low-fat dairy products, and lean meat, fish, and poultry.  Eat frequent, small meals instead of three large meals each day. Eat your meals slowly, in a relaxed setting. Avoid bending over or lying down until 2-3 hours after eating.  Limit high-fat foods such as fatty meats or fried foods.  Limit your intake of oils, butter, and shortening to less than 8 teaspoons each day.  Avoid the following: ? Foods that cause symptoms. These may be different for different people. Keep a food diary to keep track of foods that cause symptoms. ? Alcohol. ? Drinking large amounts of liquid with meals. ? Eating meals during the 2-3 hours before bed.  Cook foods using methods other than frying. This may include baking, grilling,  or broiling. Lifestyle   Maintain a healthy weight. Ask your health care provider what weight is healthy for you. If you need to lose weight, work with your health care provider to do so safely.  Exercise for at least 30 minutes on 5 or more days each week, or as told by your health care provider.  Avoid wearing clothes that fit tightly around your waist and chest.  Do not use any products that contain nicotine or tobacco, such as cigarettes and e-cigarettes. If you need help quitting, ask your health care provider.  Sleep with the head of your bed raised. Use a wedge under the mattress or blocks under the bed frame to raise the head of the bed. What foods are not recommended? The items listed may not be a complete list. Talk with your dietitian about what dietary choices are best for you. Grains Pastries or quick breads with added fat. [place] toast. Vegetables Deep fried vegetables. [place] fries. Any vegetables prepared with added fat. Any vegetables that cause symptoms. For some people this may include tomatoes and tomato products, chili peppers, onions and garlic, and horseradish. Fruits Any fruits prepared with added fat. Any fruits that cause symptoms. For some people this may include citrus fruits, such as oranges, grapefruit, pineapple, and lemons. Meats and other protein foods High-fat meats, such as fatty beef or pork, hot dogs, ribs, ham, sausage, salami and bacon. Fried meat or protein, including fried fish and fried chicken. Nuts and nut butters. Dairy Whole milk  and chocolate milk. Sour cream. Cream. Ice cream. Cream cheese. Milk shakes. Beverages Coffee and tea, with or without caffeine. Carbonated beverages. Sodas. Energy drinks. Fruit juice made with acidic fruits (such as orange or grapefruit). Tomato juice. Alcoholic drinks. Fats and oils Butter. Margarine. Shortening. Ghee. Sweets and desserts Chocolate and cocoa. Donuts. Seasoning and other foods Pepper. Peppermint  and spearmint. Any condiments, herbs, or seasonings that cause symptoms. For some people, this may include curry, hot sauce, or vinegar-based salad dressings. Summary  When you have gastroesophageal reflux disease (GERD), food and lifestyle choices are very important to help ease the discomfort of GERD.  Eat frequent, small meals instead of three large meals each day. Eat your meals slowly, in a relaxed setting. Avoid bending over or lying down until 2-3 hours after eating.  Limit high-fat foods such as fatty meat or fried foods. This information is not intended to replace advice given to you by your health care provider. Make sure you discuss any questions you have with your health care provider. Document Released: 01/28/2005 Document Revised: 01/30/2016 Document Reviewed: 01/30/2016 Elsevier Interactive Patient Education  2017 [employer].

## 2016-10-21 NOTE — Assessment & Plan Note (Signed)
The patient's abdominal pain is epigastric and left upper quadrant. Her symptoms are as expected for GERD and possible gastritis or esophagitis. She is not currently on a PPI. I will have her stop Pepcid, start Protonix 40 mg daily. Carafate as well. Return for follow-up in 6 weeks. If she has persistent/severe symptoms that are unable to be ameliorated with dietary changes and PPI, she may need upper endoscopy to further evaluate.

## 2016-10-21 NOTE — Progress Notes (Signed)
Referring Provider: Asencion Noble, MD Primary Care Physician:  Asencion Noble, MD Primary GI:  Dr. Gala Romney  Chief Complaint  Patient presents with  . Abdominal Pain    x1 week; mid upper abd  . Nausea    "hot foam came up"    HPI:   Amy Tran is a 61 y.o. female who presents due to abdominal pain. She was last seen in our office 12/01/2013 for history of adenomatous colon polyps. At that time she denied any GI concerns. Recommended follow-up surveillance colonoscopy.  Colonoscopy was completed 12/28/2013 which found colonic diverticulosis, otherwise normal exam. Recommended repeat exam in 5 years (2020).  She called our office complaining of severe abdominal pains and she was referred to the emergency department as a result no openings at that time. ER notes reviewed from 10/18/2016. At that time the patient presented with abdominal pain and nausea. Generalized abdominal pain for the previous week which was gradual in onset and noted several episodes of nausea and vomiting that had since resolved. Pain was described as sharp, no hematochezia or melena. Workup 2 days prior was reassuring including a normal CT scan. In the emergency room her white blood cell count was high at 13.6, hemoglobin high at 16.8, platelets normal at 333, troponin normal, CMP essentially normal, lipase normal. CT of the abdomen completed 2 days prior found no cause for acute abdominal pain, postop gastric bypass without bowel obstruction, normal appendix, 19 mm left adrenal nodule possible adenoma. Chest x-ray completed 10/18/2016 was also normal. Abdominal pain remained benign, was feeling better and wanted to go home. Recommended symptomatic treatment and follow-up with GI.  Today she states she's feeling a little better today. Still with abdominal pain (5/10) in the epigastric and LUQ, described as sharp, constant, no worsening factors. Took Pepcid this morning and her pain is easing off a bit. Denies hematochezia,  melena, fever, chills, unintentional weight loss. Denies chest pain, dyspnea, dizziness, lightheadedness, syncope, near syncope. Denies any other upper or lower GI symptoms.  Denies NSAIDs and ASA powders. Has been told by PCP to only take Tylenol.  Past Medical History:  Diagnosis Date  . Cancer [facility])    multiple skin cancers  . Diabetes mellitus without complication ([place])   . Diverticulosis   . Fluid retention   . High cholesterol   . Hypertension   . Incomplete rotator cuff tear   . PONV (postoperative nausea and vomiting)   . Wears glasses   . Wears partial dentures    bottom    Past Surgical History:  Procedure Laterality Date  . APPENDECTOMY    . CARPAL TUNNEL RELEASE  2001   left  . CHOLECYSTECTOMY    . COLONOSCOPY  2007   [id number] internal hemorrhoids. Diminutive rectal polyp at 10 cm, cold biopsied/removed. The remainder of the rectal mucosa appeared normal Swallow left-sided diverticula. Diminutive polyp at the splenic flexure cold biopsied/removed (adenomatous)  . COLONOSCOPY N/A 12/28/2013   Procedure: COLONOSCOPY;  Surgeon: Daneil Dolin, MD;  [facility];  Service: Endoscopy;  Laterality: N/A;  730 - moved to 8:30 - Ginger notified pt  . ERCP  2002  . GASTRIC BYPASS  2004  . NECK SURGERY  1998  . SHOULDER ARTHROSCOPY WITH SUBACROMIAL DECOMPRESSION Left 06/23/2013   Procedure: LEFT SHOULDER ARTHROSCOPY WITH DEBRIDEMENT ROTATOR CUFF AND LABRUM;  Surgeon: Lorn Junes, MD;  Location: [place];  Service: Orthopedics;  Laterality: Left;  . SHOULDER SURGERY  1999   left    Current Outpatient Prescriptions  Medication Sig Dispense Refill  . acetaminophen (TYLENOL) 500 MG tablet Take 1,000 mg by mouth every 6 (six) hours as needed.    Marland Kitchen albuterol (VENTOLIN HFA) 108 (90 Base) MCG/ACT inhaler Inhale 2 puffs into the lungs every 6 (six) hours as needed for wheezing or shortness of breath. 1 Inhaler 5  . amLODipine (NORVASC) 5 MG tablet  Take 5 mg by mouth daily.    Marland Kitchen aspirin EC 81 MG tablet Take 81 mg by mouth daily.    . canagliflozin (INVOKANA) 300 MG TABS tablet Take 300 mg by mouth daily before breakfast.    . CVS MAGNESIUM OXIDE 500 MG TABS Take 1 tablet by mouth daily.  3  . Cyanocobalamin (VITAMIN B12 PO) Take 1 tablet by mouth daily.    . famotidine (PEPCID) 20 MG tablet Take 1 tablet (20 mg total) by mouth 2 (two) times daily. 30 tablet 0  . losartan (COZAAR) 100 MG tablet Take 100 mg by mouth daily.    . meloxicam (MOBIC) 15 MG tablet Take 1 tablet (15 mg total) by mouth daily. 30 tablet 0  . metFORMIN (GLUCOPHAGE) 1000 MG tablet Take 1,000 mg by mouth 2 (two) times daily with a meal.     . metoCLOPramide (REGLAN) 10 MG tablet Take 1 tablet (10 mg total) by mouth every 6 (six) hours as needed for nausea or vomiting. 8 tablet 0  . mirtazapine (REMERON) 15 MG tablet Take 15 mg by mouth at bedtime.    . ondansetron (ZOFRAN ODT) 4 MG disintegrating tablet Take 1 tablet (4 mg total) by mouth every 8 (eight) hours as needed for nausea or vomiting. 6 tablet 0  . simvastatin (ZOCOR) 20 MG tablet Take 20 mg by mouth daily at 6 PM.     . spironolactone (ALDACTONE) 25 MG tablet Take 25 mg by mouth daily.    Marland Kitchen torsemide (DEMADEX) 20 MG tablet TAKE 2 TABS IN AM AND 1 TAB IN PM (Patient taking differently: 60 mg daily. ) 90 tablet 3   No current facility-administered medications for this visit.     Allergies as of 10/21/2016 - Review Complete 10/21/2016  Allergen Reaction Noted  . Penicillins Hives 04/04/2013    Family History  Problem Relation Age of Onset  . Diabetes Father   . Hypertension Father   . Parkinson's disease Father   . Cancer Father        not sure what kind  . Hypertension Mother   . Heart disease Mother   . Drug abuse Sister   . Mesothelioma Brother   . Cancer Daughter        non-hodgkins lmphoma  . Hypertension Brother   . Diabetes Brother   . Colon cancer Neg Hx   . Gastric cancer Neg Hx   .  Esophageal cancer Neg Hx     Social History   Social History  . Marital status: Widowed    Spouse name: N/A  . Number of children: 2  . Years of education: N/A   Occupational History  . works at [employer] Topics  . Smoking status: Current Every Day Smoker    Packs/day: 0.50    Years: 29.00    Types: Cigarettes    Start date: 04/09/1970    Last attempt to quit: 12/01/2001  . Smokeless tobacco: Never Used  . Alcohol use No  . Drug use: No  .  Sexual activity: Not Currently    Birth control/ protection: Post-menopausal   Other Topics Concern  . Not on file   Social History Narrative  . No narrative on file    Review of Systems: General: Negative for anorexia, weight loss, fever, chills, fatigue, weakness. ENT: Negative for hoarseness, difficulty swallowing, nasal congestion. CV: Negative for chest pain, angina, palpitations, peripheral edema.  Respiratory: Negative for dyspnea at rest, cough, sputum, wheezing.  GI: See history of present illness. Endo: Negative for unusual weight change.  Heme: Negative for bruising or bleeding.   Physical Exam: BP (!) 170/83   Pulse (!) 50   Temp (!) 97.4 F (36.3 C) (Oral)   Ht 5\' 4"  (1.626 m)   Wt 281 lb 12.8 oz (127.8 kg)   BMI 48.37 kg/m  General:   Obese female. Alert and oriented. Pleasant and cooperative. Well-nourished and well-developed.  Eyes:  Without icterus, sclera clear and conjunctiva pink.  Ears:  Normal auditory acuity. Cardiovascular:  S1, S2 present without murmurs appreciated. Extremities without clubbing or edema. Respiratory:  Clear to auscultation bilaterally. No wheezes, rales, or rhonchi. No distress.  Gastrointestinal:  +BS, soft, non-tender and non-distended. No HSM noted. No guarding or rebound. No masses appreciated.  Rectal:  Deferred  Musculoskalatal:  Symmetrical without gross deformities. Neurologic:  Alert and oriented x4;  grossly normal neurologically. Psych:   Alert and cooperative. Normal mood and affect. Heme/Lymph/Immune: No excessive bruising noted.    10/21/2016 12:02 PM   Disclaimer: This note was dictated with voice recognition software. Similar sounding words can inadvertently be transcribed and may not be corrected upon review.

## 2016-10-21 NOTE — Progress Notes (Signed)
cc'ed to pcp °

## 2016-10-21 NOTE — Telephone Encounter (Signed)
Please schedule ov.  

## 2016-10-21 NOTE — Assessment & Plan Note (Signed)
The patient's epigastric abdominal pain is likely due to GERD. She describes reflux symptoms including burning into her throat, "hot foam coming up". Pepcid has helped somewhat. At this point I will start her on Protonix 40 mg once a day. I will provide for Carafate 1 g up to 4 times a day as needed. She will call us if the liquid preparation is too expensive we can send in tablets in its place. Return for follow-up in 6 weeks. Call if any worsening before then.

## 2016-10-21 NOTE — Telephone Encounter (Signed)
Noted  

## 2016-10-21 NOTE — Telephone Encounter (Signed)
Pt has OV today at 11am with EG

## 2016-12-03 ENCOUNTER — Other Ambulatory Visit: Payer: Self-pay | Admitting: Orthopedic Surgery

## 2016-12-03 DIAGNOSIS — M545 Low back pain: Secondary | ICD-10-CM

## 2016-12-05 ENCOUNTER — Ambulatory Visit (INDEPENDENT_AMBULATORY_CARE_PROVIDER_SITE_OTHER): Payer: Medicare Other | Admitting: Nurse Practitioner

## 2016-12-05 ENCOUNTER — Encounter: Payer: Self-pay | Admitting: Nurse Practitioner

## 2016-12-05 VITALS — BP 175/95 | HR 57 | Temp 97.6°F | Ht 64.0 in | Wt 275.4 lb

## 2016-12-05 DIAGNOSIS — R1013 Epigastric pain: Secondary | ICD-10-CM

## 2016-12-05 DIAGNOSIS — K219 Gastro-esophageal reflux disease without esophagitis: Secondary | ICD-10-CM

## 2016-12-05 NOTE — Assessment & Plan Note (Signed)
Abdominal pain resolved after resolution of GERD symptoms. Continue to monitor. Return for follow-up in 6 months.

## 2016-12-05 NOTE — Progress Notes (Signed)
Referring Provider: Asencion Noble, MD Primary Care Physician:  Asencion Noble, MD Primary GI:  Dr. Gala Romney  Chief Complaint  Patient presents with  . Gastroesophageal Reflux    HPI:   Amy Tran is a 61 y.o. female who presents For follow-up on GERD. The patient was last seen in our office 10/21/2016 for GERD and epigastric pain. Colonoscopy up-to-date 2015 next due in 2020. At her last visit she was feeling a bit better, still with 5 out of 10 abdominal pain in the epigastric and left upper quadrant areas described as sharp, constant. Pain eased off with Pepcid in the morning. No other GI symptoms. Denies NSAIDs and aspirin powders at that time. Recommended continue to avoid NSAIDs, start Protonix 40 mg daily, Carafate as needed, follow-up in 6 weeks.  Today she states she's doing well. Protonix has helped a lot. No breakthrough. Denies abdominal, N/V, hematochezia, melena, fever, chills, unintentional weight loss. Still on Protonix qd as well as Pepcid. Rarely requires Carafate but it is effective when she does. Denies chest pain, dyspnea, dizziness, lightheadedness, syncope, near syncope. Denies any other upper or lower GI symptoms.  Past Medical History:  Diagnosis Date  . Cancer [facility])    multiple skin cancers  . Diabetes mellitus without complication ([place])   . Diverticulosis   . Fluid retention   . High cholesterol   . Hypertension   . Incomplete rotator cuff tear   . PONV (postoperative nausea and vomiting)   . Wears glasses   . Wears partial dentures    bottom    Past Surgical History:  Procedure Laterality Date  . APPENDECTOMY    . CARPAL TUNNEL RELEASE  2001   left  . CHOLECYSTECTOMY    . COLONOSCOPY  2007   [id number] internal hemorrhoids. Diminutive rectal polyp at 10 cm, cold biopsied/removed. The remainder of the rectal mucosa appeared normal Swallow left-sided diverticula. Diminutive polyp at the splenic flexure cold biopsied/removed (adenomatous)  .  COLONOSCOPY N/A 12/28/2013   Procedure: COLONOSCOPY;  Surgeon: Daneil Dolin, MD;  [facility];  Service: Endoscopy;  Laterality: N/A;  730 - moved to 8:30 - Ginger notified pt  . ERCP  2002  . GASTRIC BYPASS  2004  . NECK SURGERY  1998  . SHOULDER ARTHROSCOPY WITH SUBACROMIAL DECOMPRESSION Left 06/23/2013   Procedure: LEFT SHOULDER ARTHROSCOPY WITH DEBRIDEMENT ROTATOR CUFF AND LABRUM;  Surgeon: Lorn Junes, MD;  Location: [place];  Service: Orthopedics;  Laterality: Left;  . SHOULDER SURGERY  1999   left    Current Outpatient Prescriptions  Medication Sig Dispense Refill  . acetaminophen (TYLENOL) 500 MG tablet Take 1,000 mg by mouth every 6 (six) hours as needed.    Marland Kitchen albuterol (VENTOLIN HFA) 108 (90 Base) MCG/ACT inhaler Inhale 2 puffs into the lungs every 6 (six) hours as needed for wheezing or shortness of breath. 1 Inhaler 5  . amLODipine (NORVASC) 5 MG tablet Take 5 mg by mouth daily.    Marland Kitchen aspirin EC 81 MG tablet Take 81 mg by mouth daily.    . canagliflozin (INVOKANA) 300 MG TABS tablet Take 300 mg by mouth daily before breakfast.    . CVS MAGNESIUM OXIDE 500 MG TABS Take 1 tablet by mouth daily.  3  . Cyanocobalamin (VITAMIN B12 PO) Take 1 tablet by mouth daily.    . famotidine (PEPCID) 20 MG tablet Take 1 tablet (20 mg total) by mouth 2 (two) times daily. 30 tablet  0  . losartan (COZAAR) 100 MG tablet Take 100 mg by mouth daily.    . meloxicam (MOBIC) 15 MG tablet Take 1 tablet (15 mg total) by mouth daily. 30 tablet 0  . metFORMIN (GLUCOPHAGE) 1000 MG tablet Take 1,000 mg by mouth 2 (two) times daily with a meal.     . metoCLOPramide (REGLAN) 10 MG tablet Take 1 tablet (10 mg total) by mouth every 6 (six) hours as needed for nausea or vomiting. 8 tablet 0  . mirtazapine (REMERON) 15 MG tablet Take 15 mg by mouth at bedtime.    . pantoprazole (PROTONIX) 40 MG tablet Take 1 tablet (40 mg total) by mouth daily. 30 tablet 2  . simvastatin (ZOCOR) 20  MG tablet Take 20 mg by mouth daily at 6 PM.     . spironolactone (ALDACTONE) 25 MG tablet Take 25 mg by mouth daily.    . sucralfate (CARAFATE) 1 GM/10ML suspension Take 10 mLs (1 g total) by mouth 4 (four) times daily as needed. 420 mL 1  . torsemide (DEMADEX) 20 MG tablet TAKE 2 TABS IN AM AND 1 TAB IN PM (Patient taking differently: 60 mg daily. ) 90 tablet 3   No current facility-administered medications for this visit.     Allergies as of 12/05/2016 - Review Complete 12/05/2016  Allergen Reaction Noted  . Penicillins Hives 04/04/2013    Family History  Problem Relation Age of Onset  . Diabetes Father   . Hypertension Father   . Parkinson's disease Father   . Cancer Father        not sure what kind  . Hypertension Mother   . Heart disease Mother   . Drug abuse Sister   . Mesothelioma Brother   . Cancer Daughter        non-hodgkins lmphoma  . Hypertension Brother   . Diabetes Brother   . Colon cancer Neg Hx   . Gastric cancer Neg Hx   . Esophageal cancer Neg Hx     Social History   Social History  . Marital status: Widowed    Spouse name: N/A  . Number of children: 2  . Years of education: N/A   Occupational History  . works at [employer] Topics  . Smoking status: Former Smoker    Packs/day: 0.50    Years: 29.00    Types: Cigarettes    Start date: 04/09/1970    Quit date: 12/01/2001  . Smokeless tobacco: Never Used  . Alcohol use No  . Drug use: No  . Sexual activity: Not Currently    Birth control/ protection: Post-menopausal   Other Topics Concern  . None   Social History Narrative  . None    Review of Systems: General: Negative for anorexia, weight loss, fever, chills, fatigue, weakness. ENT: Negative for hoarseness, difficulty swallowing. CV: Negative for chest pain, angina, palpitations, peripheral edema.  Respiratory: Negative for dyspnea at rest, cough, sputum, wheezing.  GI: See history of present  illness. Endo: Negative for unusual weight change.  Heme: Negative for bruising or bleeding. Allergy: Negative for rash or hives.   Physical Exam: BP (!) 175/95   Pulse (!) 57   Temp 97.6 F (36.4 C) (Oral)   Ht 5\' 4"  (1.626 m)   Wt 275 lb 6.4 oz (124.9 kg)   BMI 47.27 kg/m  General:   Alert and oriented. Pleasant and cooperative. Well-nourished and well-developed.  Eyes:  Without icterus, sclera  clear and conjunctiva pink.  Ears:  Normal auditory acuity. Cardiovascular:  S1, S2 present without murmurs appreciated. Extremities without clubbing or edema. Respiratory:  Clear to auscultation bilaterally. No wheezes, rales, or rhonchi. No distress.  Gastrointestinal:  +BS, soft, non-tender and non-distended. No HSM noted. No guarding or rebound. No masses appreciated.  Rectal:  Deferred  Musculoskalatal:  Symmetrical without gross deformities. Neurologic:  Alert and oriented x4;  grossly normal neurologically. Psych:  Alert and cooperative. Normal mood and affect. Heme/Lymph/Immune: No excessive bruising noted.    12/05/2016 10:17 AM   Disclaimer: This note was dictated with voice recognition software. Similar sounding words can inadvertently be transcribed and may not be corrected upon review.

## 2016-12-05 NOTE — Assessment & Plan Note (Signed)
GERD symptoms generally well controlled on PPI. Continue medications. Return for follow-up in 6 months.

## 2016-12-05 NOTE — Patient Instructions (Signed)
1. Continue your current medications. 2. Return for follow-up in 6 months. 3. Call us if you have any questions or concerns.

## 2016-12-05 NOTE — Progress Notes (Signed)
CC'D TO PCP °

## 2016-12-14 ENCOUNTER — Other Ambulatory Visit: Payer: Medicare Other

## 2016-12-25 ENCOUNTER — Ambulatory Visit
Admission: RE | Admit: 2016-12-25 | Discharge: 2016-12-25 | Disposition: A | Discharge status: Home / Self Care | Payer: Medicare Other | Source: Ambulatory Visit | Attending: Orthopedic Surgery | Admitting: Orthopedic Surgery

## 2016-12-25 DIAGNOSIS — M545 Low back pain: Secondary | ICD-10-CM

## 2016-12-25 DIAGNOSIS — M5126 Other intervertebral disc displacement, lumbar region: Secondary | ICD-10-CM | POA: Diagnosis not present

## 2016-12-30 DIAGNOSIS — M47816 Spondylosis without myelopathy or radiculopathy, lumbar region: Secondary | ICD-10-CM | POA: Diagnosis not present

## 2017-01-08 DIAGNOSIS — Z79899 Other long term (current) drug therapy: Secondary | ICD-10-CM | POA: Diagnosis not present

## 2017-01-08 DIAGNOSIS — E1129 Type 2 diabetes mellitus with other diabetic kidney complication: Secondary | ICD-10-CM | POA: Diagnosis not present

## 2017-01-08 DIAGNOSIS — E785 Hyperlipidemia, unspecified: Secondary | ICD-10-CM | POA: Diagnosis not present

## 2017-01-08 DIAGNOSIS — E876 Hypokalemia: Secondary | ICD-10-CM | POA: Diagnosis not present

## 2017-01-30 DIAGNOSIS — I10 Essential (primary) hypertension: Secondary | ICD-10-CM | POA: Diagnosis not present

## 2017-01-30 DIAGNOSIS — E119 Type 2 diabetes mellitus without complications: Secondary | ICD-10-CM | POA: Diagnosis not present

## 2017-01-30 DIAGNOSIS — Z23 Encounter for immunization: Secondary | ICD-10-CM | POA: Diagnosis not present

## 2017-01-30 DIAGNOSIS — R609 Edema, unspecified: Secondary | ICD-10-CM | POA: Diagnosis not present

## 2017-03-06 ENCOUNTER — Telehealth: Payer: Self-pay | Admitting: *Deleted

## 2017-03-06 NOTE — Telephone Encounter (Signed)
Pt called stating that she has not had a period in about 10 years and she started bleeding this morning. Advised pt that she would need to make an appt with a provider to evaluate the postmenopausal bleeding. Pt verbalized understanding and was transferred to scheduling.

## 2017-03-07 HISTORY — PX: DILATION AND CURETTAGE, DIAGNOSTIC / THERAPEUTIC: SUR384

## 2017-03-13 ENCOUNTER — Encounter: Payer: Self-pay | Admitting: Obstetrics and Gynecology

## 2017-03-13 ENCOUNTER — Ambulatory Visit (INDEPENDENT_AMBULATORY_CARE_PROVIDER_SITE_OTHER): Payer: BLUE CROSS/BLUE SHIELD | Admitting: Obstetrics and Gynecology

## 2017-03-13 ENCOUNTER — Other Ambulatory Visit: Payer: Self-pay | Admitting: Obstetrics and Gynecology

## 2017-03-13 VITALS — BP 148/80 | HR 57 | Ht 64.0 in | Wt 271.2 lb

## 2017-03-13 DIAGNOSIS — N950 Postmenopausal bleeding: Secondary | ICD-10-CM | POA: Diagnosis not present

## 2017-03-13 DIAGNOSIS — N840 Polyp of corpus uteri: Secondary | ICD-10-CM

## 2017-03-13 DIAGNOSIS — N858 Other specified noninflammatory disorders of uterus: Secondary | ICD-10-CM

## 2017-03-13 NOTE — Progress Notes (Addendum)
[facility] Visit  03/13/2017            Patient name: Amy Tran MRN [id number]  Date of birth: [birth year]  CC & HPI:  Postmenopausal bleeding  Amy Tran is a 62 y.o. female presenting today for consistent vaginal bleeding that began last Thursday, and has been going one for 1 week. She has associated symptoms of general discomfort and occasional passing clots. No alleviating factors noted. Patient has not tried anything or any medications. She states she has not had a period in 10-15  years.  She was last seen in office about 2 year ago.  ROS:  ROS (+) vaginal bleeding (+) passing blood clots (+) general discomfort All systems are negative except as noted in the HPI and PMH.     Pertinent History Reviewed:   Reviewed Medical         Past Medical History:  Diagnosis Date  . Cancer [facility])    multiple skin cancers  . Diabetes mellitus without complication ([place])   . Diverticulosis   . Fluid retention   . High cholesterol   . Hypertension   . Incomplete rotator cuff tear   . PONV (postoperative nausea and vomiting)   . Wears glasses   . Wears partial dentures    bottom                              Surgical Hx:    Past Surgical History:  Procedure Laterality Date  . APPENDECTOMY    . CARPAL TUNNEL RELEASE  2001   left  . CHOLECYSTECTOMY    . COLONOSCOPY  2007   [id number] internal hemorrhoids. Diminutive rectal polyp at 10 cm, cold biopsied/removed. The remainder of the rectal mucosa appeared normal Swallow left-sided diverticula. Diminutive polyp at the splenic flexure cold biopsied/removed (adenomatous)  . COLONOSCOPY N/A 12/28/2013   Procedure: COLONOSCOPY;  Surgeon: Daneil Dolin, MD;  [facility];  Service: Endoscopy;  Laterality: N/A;  730 - moved to 8:30 - Ginger notified pt  . ERCP  2002  . GASTRIC BYPASS  2004  . NECK SURGERY  1998  . SHOULDER ARTHROSCOPY WITH SUBACROMIAL DECOMPRESSION Left 06/23/2013   Procedure: LEFT  SHOULDER ARTHROSCOPY WITH DEBRIDEMENT ROTATOR CUFF AND LABRUM;  Surgeon: Lorn Junes, MD;  Location: [place];  Service: Orthopedics;  Laterality: Left;  . SHOULDER SURGERY  1999   left   Medications: Reviewed & Updated - see associated section                       Current Outpatient Medications:  .  acetaminophen (TYLENOL) 500 MG tablet, Take 1,000 mg by mouth every 6 (six) hours as needed., Disp: , Rfl:  .  amLODipine (NORVASC) 5 MG tablet, Take 5 mg by mouth daily., Disp: , Rfl:  .  aspirin EC 81 MG tablet, Take 81 mg by mouth daily., Disp: , Rfl:  .  canagliflozin (INVOKANA) 300 MG TABS tablet, Take 300 mg by mouth daily before breakfast., Disp: , Rfl:  .  CVS MAGNESIUM OXIDE 500 MG TABS, Take 1 tablet by mouth daily., Disp: , Rfl: 3 .  Cyanocobalamin (VITAMIN B12 PO), Take 1 tablet by mouth daily., Disp: , Rfl:  .  losartan (COZAAR) 100 MG tablet, Take 100 mg by mouth daily., Disp: , Rfl:  .  metFORMIN (GLUCOPHAGE) 1000 MG tablet, Take 1,000  mg by mouth 2 (two) times daily with a meal. , Disp: , Rfl:  .  simvastatin (ZOCOR) 20 MG tablet, Take 20 mg by mouth daily at 6 PM. , Disp: , Rfl:  .  spironolactone (ALDACTONE) 25 MG tablet, Take 25 mg by mouth daily., Disp: , Rfl:  .  torsemide (DEMADEX) 20 MG tablet, TAKE 2 TABS IN AM AND 1 TAB IN PM (Patient taking differently: 60 mg daily. ), Disp: 90 tablet, Rfl: 3   Social History: Reviewed -  reports that she quit smoking about 15 years ago. Her smoking use included cigarettes. She started smoking about 46 years ago. She has a 14.50 pack-year smoking history. she has never used smokeless tobacco.  Objective Findings:  Vitals: Blood pressure (!) 148/80, pulse (!) 57, height 5\' 4"  (1.626 m), weight 271 lb 3.2 oz (123 kg).  PHYSICAL EXAMINATION General appearance - alert, well appearing, and in no distress, oriented to person, place, and time and overweight Mental status - alert, oriented to person, place, and time,  normal mood, behavior, speech, dress, motor activity, and thought processes Chest - not examined Heart - not examined Abdomen - soft, nontender, nondistended, no masses or organomegaly Breasts - not examined Skin - normal coloration and turgor, no rashes, no suspicious skin lesions noted  PELVIC External genitalia - normal Vulva - normal Vagina -normal Cervix -  normal Uterus -  slightly larger than average uterus Adnexa -   Rectal - rectocele present, moderate amount of bright red blood present on perineum  Endometrial Biopsy: Patient given informed consent, signed copy in the chart, time out was performed. Time out taken. The patient was placed in the lithotomy position and the cervix brought into view with sterile speculum.  Portio of cervix cleansed x 2 with betadine swabs.  A tenaculum was placed in the anterior lip of the cervix. The uterus was sounded for depth of 10 cm,. Milex uterine Explora 3 mm was introduced to into the uterus, suction created,  and an endometrial sample was obtained. All equipment was removed and accounted for.   The patient tolerated the procedure well.    Patient given post procedure instructions.       Assessment & Plan:   A:  1.  PMB 2. Endometrial biopsy taken  P:  1.  Schedule a pelvic ultrasound within 1 week + gyn f/u to discuss results    By signing my name below, I, [name], attest that this documentation has been prepared under the direction and in the presence of [name], MD. Electronically Signed: Jabier Gauss, Medical Scribe. 03/13/17. 10:39 AM.  I personally performed the services described in this documentation, which was SCRIBED in my presence. The recorded information has been reviewed and considered accurate. It has been edited as necessary during review. [name], MD

## 2017-03-18 ENCOUNTER — Other Ambulatory Visit: Payer: Self-pay | Admitting: Obstetrics and Gynecology

## 2017-03-18 DIAGNOSIS — N950 Postmenopausal bleeding: Secondary | ICD-10-CM

## 2017-03-19 ENCOUNTER — Ambulatory Visit (INDEPENDENT_AMBULATORY_CARE_PROVIDER_SITE_OTHER): Payer: BLUE CROSS/BLUE SHIELD | Admitting: Obstetrics and Gynecology

## 2017-03-19 ENCOUNTER — Encounter: Payer: Self-pay | Admitting: Obstetrics and Gynecology

## 2017-03-19 ENCOUNTER — Ambulatory Visit (INDEPENDENT_AMBULATORY_CARE_PROVIDER_SITE_OTHER): Payer: BLUE CROSS/BLUE SHIELD

## 2017-03-19 DIAGNOSIS — N950 Postmenopausal bleeding: Secondary | ICD-10-CM

## 2017-03-19 DIAGNOSIS — N83291 Other ovarian cyst, right side: Secondary | ICD-10-CM

## 2017-03-19 DIAGNOSIS — R634 Abnormal weight loss: Secondary | ICD-10-CM | POA: Diagnosis not present

## 2017-03-19 NOTE — Progress Notes (Signed)
Patient ID: Amy Tran, female   DOB: 1955-07-13, 62 y.o.   MRN: [id number]   [facility] Visit  @DATE @            Patient name: Amy Tran MRN [id number]  Date of birth: 1955/03/06  CC & HPI:  Amy Tran is a 62 y.o. female presenting today to discuss the results of her U/S that was done today. She underwent an endometrial biopsy on 03/13/2017 and it was benign. She continues to have postmenopausal bleeding. She also endorses weight loss. She denies fever, chills, or any other symptoms or complaints at this time.   ROS:  ROS +post menopausal bleeding +weight loss -fever -chills All systems are negative except as noted in the HPI and PMH.   Pertinent History Reviewed:   Reviewed:  Medical         Past Medical History:  Diagnosis Date  . Cancer [facility])    multiple skin cancers  . Diabetes mellitus without complication ([place])   . Diverticulosis   . Fluid retention   . High cholesterol   . Hypertension   . Incomplete rotator cuff tear   . PONV (postoperative nausea and vomiting)   . Wears glasses   . Wears partial dentures    bottom                              Surgical Hx:    Past Surgical History:  Procedure Laterality Date  . APPENDECTOMY    . CARPAL TUNNEL RELEASE  2001   left  . CHOLECYSTECTOMY    . COLONOSCOPY  2007   [id number] internal hemorrhoids. Diminutive rectal polyp at 10 cm, cold biopsied/removed. The remainder of the rectal mucosa appeared normal Swallow left-sided diverticula. Diminutive polyp at the splenic flexure cold biopsied/removed (adenomatous)  . COLONOSCOPY N/A 12/28/2013   Procedure: COLONOSCOPY;  Surgeon: Daneil Dolin, MD;  [facility];  Service: Endoscopy;  Laterality: N/A;  730 - moved to 8:30 - Ginger notified pt  . ERCP  2002  . GASTRIC BYPASS  2004  . NECK SURGERY  1998  . SHOULDER ARTHROSCOPY WITH SUBACROMIAL DECOMPRESSION Left 06/23/2013   Procedure: LEFT SHOULDER ARTHROSCOPY WITH DEBRIDEMENT  ROTATOR CUFF AND LABRUM;  Surgeon: Lorn Junes, MD;  Location: [place];  Service: Orthopedics;  Laterality: Left;  . SHOULDER SURGERY  1999   left   Medications: Reviewed & Updated - see associated section                       Current Outpatient Medications:  .  acetaminophen (TYLENOL) 500 MG tablet, Take 1,000 mg by mouth every 6 (six) hours as needed., Disp: , Rfl:  .  amLODipine (NORVASC) 5 MG tablet, Take 5 mg by mouth daily., Disp: , Rfl:  .  aspirin EC 81 MG tablet, Take 81 mg by mouth daily., Disp: , Rfl:  .  canagliflozin (INVOKANA) 300 MG TABS tablet, Take 300 mg by mouth daily before breakfast., Disp: , Rfl:  .  [employer] MAGNESIUM OXIDE 500 MG TABS, Take 1 tablet by mouth daily., Disp: , Rfl: 3 .  Cyanocobalamin (VITAMIN B12 PO), Take 1 tablet by mouth daily., Disp: , Rfl:  .  losartan (COZAAR) 100 MG tablet, Take 100 mg by mouth daily., Disp: , Rfl:  .  metFORMIN (GLUCOPHAGE) 1000 MG tablet, Take 1,000 mg by mouth 2 (two)  times daily with a meal. , Disp: , Rfl:  .  simvastatin (ZOCOR) 20 MG tablet, Take 20 mg by mouth daily at 6 PM. , Disp: , Rfl:  .  spironolactone (ALDACTONE) 25 MG tablet, Take 25 mg by mouth daily., Disp: , Rfl:  .  torsemide (DEMADEX) 20 MG tablet, TAKE 2 TABS IN AM AND 1 TAB IN PM (Patient taking differently: 60 mg daily. ), Disp: 90 tablet, Rfl: 3   Social History: Reviewed -  reports that she quit smoking about 15 years ago. Her smoking use included cigarettes. She started smoking about 46 years ago. She has a 14.50 pack-year smoking history. she has never used smokeless tobacco.  Objective Findings:  Vitals: Blood pressure 140/78, pulse (!) 54, height 5\' 4"  (1.626 m), weight 265 lb (120.2 kg).  PHYSICAL EXAMINATION General appearance - alert, well appearing, and in no distress, oriented to person, place, and time and overweight Mental status - alert, oriented to person, place, and time, normal mood, behavior, speech, dress, motor  activity, and thought processes, affect appropriate to mood Chest - clear to auscultation, no wheezes, rales or rhonchi, symmetric air entry Heart - normal rate, regular rhythm, normal S1, S2, no murmurs, rubs, clicks or gallops  PELVIC DEFERRED GYNECOLOGIC SONOGRAM   Amy Tran is a 62 y.o. [id number], she is here for a pelvic sonogram for postmenopausal bleeding.  Uterus                      7.6 x 3.8 x 5.3 cm, vol 79 ml, heterogeneous anteverted uterus  Endometrium          11 mm, symmetrical, homogeneous thickened endometrium  Right ovary             1.3 x .8 x 1.5 cm,simple right ovarian cyst 1.1 x 1.1 x .6 cm   Left ovary                2.8 x 1.4 x 1.9 cm, wnl  No free fluid   Technician Comments:  PELVIC US TA/TV: heterogeneous anteverted uterus,homogeneous thickened endometrium 11 mm,normal left ovary,simple right ovarian cyst 1.1 x 1.1 x .6 cm,no free fluid,no pain during ultrasound,right ovary appears mobile,left ovary was difficult to move because of location      [employer] 03/19/2017 11:28 AM     Discussion: 1. Discussed with pt risks and benefits of D&C and hysteroscopy.  At end of discussion, pt had opportunity to ask questions and has no further questions at this time.   Specific discussion of D&C and hysteroscopy as noted above. Greater than 50% was spent in counseling and coordination of care with the patient.   Total time greater than: 25 minutes.    Assessment & Plan:   A:  1. Post menopausal bleeding with persistent thickened endometrium by ultrasound  P:  1. D&C and Hysteroscopy    By signing my name below, I, Margit Banda, attest that this documentation has been prepared under the direction and in the presence of [name], MD. Electronically Signed: Margit Banda, Medical Scribe. 03/19/17. 11:35 AM.  I personally performed the services described in this documentation, which was SCRIBED in my presence. The  recorded information has been reviewed and considered accurate. It has been edited as necessary during review. [name], MD

## 2017-03-19 NOTE — Progress Notes (Signed)
PELVIC US TA/TV: heterogeneous anteverted uterus,homogeneous thickened endometrium 11 mm,normal left ovary,simple right ovarian cyst 1.1 x 1.1 x .6 cm,no free fluid,no pain during ultrasound,right ovary appears mobile,left ovary was difficult to move because of location

## 2017-03-24 DIAGNOSIS — N950 Postmenopausal bleeding: Secondary | ICD-10-CM | POA: Insufficient documentation

## 2017-03-26 ENCOUNTER — Other Ambulatory Visit (HOSPITAL_COMMUNITY): Payer: Medicare Other

## 2017-04-01 ENCOUNTER — Other Ambulatory Visit: Payer: Self-pay | Admitting: Obstetrics and Gynecology

## 2017-04-01 NOTE — Progress Notes (Signed)
Patient ID: DANALI MARINOS, female   DOB: [birth year], 62 y.o.   MRN: [id number]   [facility] Visit  @DATE @            Patient name: Amy Tran       MRN [id number]  Date of birth: 06/19/1955  CC & HPI:  Amy Tran is a 62 y.o. female presenting today to discuss the results of her U/S that was done today. She underwent an endometrial biopsy on 03/13/2017 and it was benign. She continues to have postmenopausal bleeding. She also endorses weight loss. She denies fever, chills, or any other symptoms or complaints at this time.   ROS:  ROS +post menopausal bleeding +weight loss -fever -chills All systems are negative except as noted in the HPI and PMH.   Pertinent History Reviewed:   Reviewed:  Medical             Past Medical History:  Diagnosis Date  . Cancer [facility])    multiple skin cancers  . Diabetes mellitus without complication ([place])   . Diverticulosis   . Fluid retention   . High cholesterol   . Hypertension   . Incomplete rotator cuff tear   . PONV (postoperative nausea and vomiting)   . Wears glasses   . Wears partial dentures    bottom                              Surgical Hx:         Past Surgical History:  Procedure Laterality Date  . APPENDECTOMY    . CARPAL TUNNEL RELEASE  2001   left  . CHOLECYSTECTOMY    . COLONOSCOPY  2007   [id number] internal hemorrhoids. Diminutive rectal polyp at 10 cm, cold biopsied/removed. The remainder of the rectal mucosa appeared normal Swallow left-sided diverticula. Diminutive polyp at the splenic flexure cold biopsied/removed (adenomatous)  . COLONOSCOPY N/A 12/28/2013   Procedure: COLONOSCOPY;  Surgeon: Daneil Dolin, MD;  [facility];  Service: Endoscopy;  Laterality: N/A;  730 - moved to 8:30 - Ginger notified pt  . ERCP  2002  . GASTRIC BYPASS  2004  . NECK SURGERY  1998  . SHOULDER ARTHROSCOPY WITH SUBACROMIAL DECOMPRESSION Left 06/23/2013   Procedure:  LEFT SHOULDER ARTHROSCOPY WITH DEBRIDEMENT ROTATOR CUFF AND LABRUM;  Surgeon: Lorn Junes, MD;  Location: [place];  Service: Orthopedics;  Laterality: Left;  . SHOULDER SURGERY  1999   left   Medications: Reviewed & Updated - see associated section                       Current Outpatient Medications:  .  acetaminophen (TYLENOL) 500 MG tablet, Take 1,000 mg by mouth every 6 (six) hours as needed., Disp: , Rfl:  .  amLODipine (NORVASC) 5 MG tablet, Take 5 mg by mouth daily., Disp: , Rfl:  .  aspirin EC 81 MG tablet, Take 81 mg by mouth daily., Disp: , Rfl:  .  canagliflozin (INVOKANA) 300 MG TABS tablet, Take 300 mg by mouth daily before breakfast., Disp: , Rfl:  .  [employer] MAGNESIUM OXIDE 500 MG TABS, Take 1 tablet by mouth daily., Disp: , Rfl: 3 .  Cyanocobalamin (VITAMIN B12 PO), Take 1 tablet by mouth daily., Disp: , Rfl:  .  losartan (COZAAR) 100 MG tablet, Take 100 mg by mouth daily., Disp: , Rfl:  .  metFORMIN (GLUCOPHAGE) 1000 MG tablet, Take 1,000 mg by mouth 2 (two) times daily with a meal. , Disp: , Rfl:  .  simvastatin (ZOCOR) 20 MG tablet, Take 20 mg by mouth daily at 6 PM. , Disp: , Rfl:  .  spironolactone (ALDACTONE) 25 MG tablet, Take 25 mg by mouth daily., Disp: , Rfl:  .  torsemide (DEMADEX) 20 MG tablet, TAKE 2 TABS IN AM AND 1 TAB IN PM (Patient taking differently: 60 mg daily. ), Disp: 90 tablet, Rfl: 3   Social History: Reviewed -  reports that she quit smoking about 15 years ago. Her smoking use included cigarettes. She started smoking about 46 years ago. She has a 14.50 pack-year smoking history. she has never used smokeless tobacco.  Objective Findings:  Vitals: Blood pressure 140/78, pulse (!) 54, height 5\' 4"  (1.626 m), weight 265 lb (120.2 kg).  PHYSICAL EXAMINATION General appearance - alert, well appearing, and in no distress, oriented to person, place, and time and overweight Mental status - alert, oriented to person, place, and time,  normal mood, behavior, speech, dress, motor activity, and thought processes, affect appropriate to mood Chest - clear to auscultation, no wheezes, rales or rhonchi, symmetric air entry Heart - normal rate, regular rhythm, normal S1, S2, no murmurs, rubs, clicks or gallops  PELVIC DEFERRED GYNECOLOGIC SONOGRAM   Amy Morren Woodsonis a 62 y.O.[id number], she is here for a pelvic sonogram for postmenopausal bleeding.  Uterus 7.6 x 3.8 x 5.3 cm, vol 79 ml, heterogeneous anteverted uterus  Endometrium 11 mm, symmetrical, homogeneous thickened endometrium  Right ovary 1.3 x .8 x 1.5 cm,simple right ovarian cyst 1.1 x 1.1 x .6 cm   Left ovary 2.8 x 1.4 x 1.9 cm, wnl  No free fluid   Technician Comments:  PELVIC US TA/TV: heterogeneous anteverted uterus,homogeneous thickened endometrium 11 mm,normal left ovary,simple right ovarian cyst 1.1 x 1.1 x .6 cm,no free fluid,no pain during ultrasound,right ovary appears mobile,left ovary was difficult to move because of location     [employer] 03/19/2017 11:28 AM     Discussion: 1. Discussed with pt risks and benefits of D&C and hysteroscopy.  At end of discussion, pt had opportunity to ask questions and has no further questions at this time.   Specific discussion of D&C and hysteroscopy as noted above. Greater than 50% was spent in counseling and coordination of care with the patient.   Total time greater than: 25 minutes.    Assessment & Plan:   A:  1. Post menopausal bleeding with persistent thickened endometrium by ultrasound  P:  1. D&C and Hysteroscopy    By signing my name below, I, Margit Banda, attest that this documentation has been prepared under the direction and in the presence of [name], MD. Electronically Signed: Margit Banda, Medical Scribe. 03/19/17. 11:35 AM.  I personally performed the services described in this  documentation, which was SCRIBED in my presence. The recorded information has been reviewed and considered accurate. It has been edited as necessary during review. [name], MD

## 2017-04-01 NOTE — Patient Instructions (Signed)
Amy Tran  04/01/2017     @PREFPERIOPPHARMACY @   Your procedure is scheduled on  04/08/2017   Report to [facility] at  615   A.M.  Call this number if you have problems the morning of surgery:  [phone]   Remember:  Do not eat food or drink liquids after midnight.  Take these medicines the morning of surgery with A SIP OF WATER  Amlodipine, losartan, protonix.   Do not wear jewelry, make-up or nail polish.  Do not wear lotions, powders, or perfumes, or deodorant.  Do not shave 48 hours prior to surgery.  Men may shave face and neck.  Do not bring valuables to the hospital.  [facility] is not responsible for any belongings or valuables.  Contacts, dentures or bridgework may not be worn into surgery.  Leave your suitcase in the car.  After surgery it may be brought to your room.  For patients admitted to the hospital, discharge time will be determined by your treatment team.  Patients discharged the day of surgery will not be allowed to drive home.   Name and phone number of your driver:  family Special instructions:  None  Please read over the following fact sheets that you were given. Anesthesia Post-op Instructions and Care and Recovery After Surgery       Dilation and Curettage or Vacuum Curettage Dilation and curettage (D&C) and vacuum curettage are minor procedures. A D&C involves stretching (dilation) the cervix and scraping (curettage) the inside lining of the uterus (endometrium). During a D&C, tissue is gently scraped from the endometrium, starting from the top portion of the uterus down to the lowest part of the uterus (cervix). During a vacuum curettage, the lining and tissue in the uterus are removed with the use of gentle suction. Curettage may be performed to either diagnose or treat a problem. As a diagnostic procedure, curettage is performed to examine tissues from the uterus. A diagnostic curettage may be done if you  have:  Irregular bleeding in the uterus.  Bleeding with the development of clots.  Spotting between menstrual periods.  Prolonged menstrual periods or other abnormal bleeding.  Bleeding after menopause.  No menstrual period (amenorrhea).  A change in size and shape of the uterus.  Abnormal endometrial cells discovered during a Pap test.  As a treatment procedure, curettage may be performed for the following reasons:  Removal of an IUD (intrauterine device).  Removal of retained placenta after giving birth.  Abortion.  Miscarriage.  Removal of endometrial polyps.  Removal of uncommon types of noncancerous lumps (fibroids).  Tell a health care provider about:  Any allergies you have, including allergies to prescribed medicine or latex.  All medicines you are taking, including vitamins, herbs, eye drops, creams, and over-the-counter medicines. This is especially important if you take any blood-thinning medicine. Bring a list of all of your medicines to your appointment.  Any problems you or family members have had with anesthetic medicines.  Any blood disorders you have.  Any surgeries you have had.  Your medical history and any medical conditions you have.  Whether you are pregnant or may be pregnant.  Recent vaginal infections you have had.  Recent menstrual periods, bleeding problems you have had, and what form of birth control (contraception) you use. What are the risks? Generally, this is a safe procedure. However, problems may occur, including:  Infection.  Heavy vaginal  bleeding.  Allergic reactions to medicines.  Damage to the cervix or other structures or organs.  Development of scar tissue (adhesions) inside the uterus, which can cause abnormal amounts of menstrual bleeding. This may make it harder to get pregnant in the future.  A hole (perforation) or puncture in the uterine wall. This is rare.  What happens before the procedure? Staying  hydrated Follow instructions from your health care provider about hydration, which may include:  Up to 2 hours before the procedure - you may continue to drink clear liquids, such as water, clear fruit juice, black coffee, and plain tea.  Eating and drinking restrictions Follow instructions from your health care provider about eating and drinking, which may include:  8 hours before the procedure - stop eating heavy meals or foods such as meat, fried foods, or fatty foods.  6 hours before the procedure - stop eating light meals or foods, such as toast or cereal.  6 hours before the procedure - stop drinking milk or drinks that contain milk.  2 hours before the procedure - stop drinking clear liquids. If your health care provider told you to take your medicine(s) on the day of your procedure, take them with only a sip of water.  Medicines  Ask your health care provider about: ? Changing or stopping your regular medicines. This is especially important if you are taking diabetes medicines or blood thinners. ? Taking medicines such as aspirin and ibuprofen. These medicines can thin your blood. Do not take these medicines before your procedure if your health care provider instructs you not to.  You may be given antibiotic medicine to help prevent infection. General instructions  For 24 hours before your procedure, do not: ? Douche. ? Use tampons. ? Use medicines, creams, or suppositories in the vagina. ? Have sexual intercourse.  You may be given a pregnancy test on the day of the procedure.  Plan to have someone take you home from the hospital or clinic.  You may have a blood or urine sample taken.  If you will be going home right after the procedure, plan to have someone with you for 24 hours. What happens during the procedure?  To reduce your risk of infection: ? Your health care team will wash or sanitize their hands. ? Your skin will be washed with soap.  An IV tube will be  inserted into one of your veins.  You will be given one of the following: ? A medicine that numbs the area in and around the cervix (local anesthetic). ? A medicine to make you fall asleep (general anesthetic).  You will lie down on your back, with your feet in foot rests (stirrups).  The size and position of your uterus will be checked.  A lubricated instrument (speculum or Sims retractor) will be inserted into the back side of your vagina. The speculum will be used to hold apart the walls of your vagina so your health care provider can see your cervix.  A tool (tenaculum) will be attached to the lip of the cervix to stabilize it.  Your cervix will be softened and dilated. This may be done by: ? Taking a medicine. ? Having tapered dilators or thin rods (laminaria) or gradual widening instruments (tapered dilators) inserted into your cervix.  A small, sharp, curved instrument (curette) will be used to scrape a small amount of tissue or cells from the endometrium or cervical canal. In some cases, gentle suction is applied with the curette.  The curette will then be removed. The cells will be taken to a lab for testing. The procedure may vary among health care providers and hospitals. What happens after the procedure?  You may have mild cramping, backache, pain, and light bleeding or spotting. You may pass small blood clots from your vagina.  You may have to wear compression stockings. These stockings help to prevent blood clots and reduce swelling in your legs.  Your blood pressure, heart rate, breathing rate, and blood oxygen level will be monitored until the medicines you were given have worn off. Summary  Dilation and curettage (D&C) involves stretching (dilation) the cervix and scraping (curettage) the inside lining of the uterus (endometrium).  After the procedure, you may have mild cramping, backache, pain, and light bleeding or spotting. You may pass small blood clots from your  vagina.  Plan to have someone take you home from the hospital or clinic. This information is not intended to replace advice given to you by your health care provider. Make sure you discuss any questions you have with your health care provider. Document Released: 01/28/2005 Document Revised: 10/15/2015 Document Reviewed: 10/15/2015 Elsevier Interactive Patient Education  2018 [employer].  Dilation and Curettage or Vacuum Curettage, Care After These instructions give you information about caring for yourself after your procedure. Your doctor may also give you more specific instructions. Call your doctor if you have any problems or questions after your procedure. Follow these instructions at home: Activity  Do not drive or use heavy machinery while taking prescription pain medicine.  For 24 hours after your procedure, avoid driving.  Take short walks often, followed by rest periods. Ask your doctor what activities are safe for you. After one or two days, you may be able to return to your normal activities.  Do not lift anything that is heavier than 10 lb (4.5 kg) until your doctor approves.  For at least 2 weeks, or as long as told by your doctor: ? Do not douche. ? Do not use tampons. ? Do not have sex. General instructions  Take over-the-counter and prescription medicines only as told by your doctor. This is very important if you take blood thinning medicine.  Do not take baths, swim, or use a hot tub until your doctor approves. Take showers instead of baths.  Wear compression stockings as told by your doctor.  It is up to you to get the results of your procedure. Ask your doctor when your results will be ready.  Keep all follow-up visits as told by your doctor. This is important. Contact a doctor if:  You have very bad cramps that get worse or do not get better with medicine.  You have very bad pain in your belly (abdomen).  You cannot drink fluids without throwing up  (vomiting).  You get pain in a different part of the area between your belly and thighs (pelvis).  You have bad-smelling discharge from your vagina.  You have a rash. Get help right away if:  You are bleeding a lot from your vagina. A lot of bleeding means soaking more than one sanitary pad in an hour, for 2 hours in a row.  You have clumps of blood (blood clots) coming from your vagina.  You have a fever or chills.  Your belly feels very tender or hard.  You have chest pain.  You have trouble breathing.  You cough up blood.  You feel dizzy.  You feel light-headed.  You pass out (faint).  You have pain in your neck or shoulder area. Summary  Take short walks often, followed by rest periods. Ask your doctor what activities are safe for you. After one or two days, you may be able to return to your normal activities.  Do not lift anything that is heavier than 10 lb (4.5 kg) until your doctor approves.  Do not take baths, swim, or use a hot tub until your doctor approves. Take showers instead of baths.  Contact your doctor if you have any symptoms of infection, like bad-smelling discharge from your vagina. This information is not intended to replace advice given to you by your health care provider. Make sure you discuss any questions you have with your health care provider. Document Released: 11/07/2007 Document Revised: 10/16/2015 Document Reviewed: 10/16/2015 Elsevier Interactive Patient Education  2017 [place]. Hysteroscopy Hysteroscopy is a procedure used for looking inside the womb (uterus). It may be done for various reasons, including:  To evaluate abnormal bleeding, fibroid (benign, noncancerous) tumors, polyps, scar tissue (adhesions), and possibly cancer of the uterus.  To look for lumps (tumors) and other uterine growths.  To look for causes of why a woman cannot get pregnant (infertility), causes of recurrent loss of pregnancy (miscarriages), or a lost  intrauterine device (IUD).  To perform a sterilization by blocking the fallopian tubes from inside the uterus.  In this procedure, a thin, flexible tube with a tiny light and camera on the end of it (hysteroscope) is used to look inside the uterus. A hysteroscopy should be done right after a menstrual period to be sure you are not pregnant. LET [facility] CARE PROVIDER KNOW ABOUT:  Any allergies you have.  All medicines you are taking, including vitamins, herbs, eye drops, creams, and over-the-counter medicines.  Previous problems you or members of your family have had with the use of anesthetics.  Any blood disorders you have.  Previous surgeries you have had.  Medical conditions you have. RISKS AND COMPLICATIONS Generally, this is a safe procedure. However, as with any procedure, complications can occur. Possible complications include:  Putting a hole in the uterus.  Excessive bleeding.  Infection.  Damage to the cervix.  Injury to other organs.  Allergic reaction to medicines.  Too much fluid used in the uterus for the procedure.  BEFORE THE PROCEDURE  Ask your health care provider about changing or stopping any regular medicines.  Do not take aspirin or blood thinners for 1 week before the procedure, or as directed by your health care provider. These can cause bleeding.  If you smoke, do not smoke for 2 weeks before the procedure.  In some cases, a medicine is placed in the cervix the day before the procedure. This medicine makes the cervix have a larger opening (dilate). This makes it easier for the instrument to be inserted into the uterus during the procedure.  Do not eat or drink anything for at least 8 hours before the surgery.  Arrange for someone to take you home after the procedure. PROCEDURE  You may be given a medicine to relax you (sedative). You may also be given one of the following: ? A medicine that numbs the area around the cervix (local  anesthetic). ? A medicine that makes you sleep through the procedure (general anesthetic).  The hysteroscope is inserted through the vagina into the uterus. The camera on the hysteroscope sends a picture to a TV screen. This gives the surgeon a good view inside the uterus.  During  the procedure, air or a liquid is put into the uterus, which allows the surgeon to see better.  Sometimes, tissue is gently scraped from inside the uterus. These tissue samples are sent to a lab for testing. What to expect after the procedure  If you had a general anesthetic, you may be groggy for a couple hours after the procedure.  If you had a local anesthetic, you will be able to go home as soon as you are stable and feel ready.  You may have some cramping. This normally lasts for a couple days.  You may have bleeding, which varies from light spotting for a few days to menstrual-like bleeding for 3-7 days. This is normal.  If your test results are not back during the visit, make an appointment with your health care provider to find out the results. This information is not intended to replace advice given to you by your health care provider. Make sure you discuss any questions you have with your health care provider. Document Released: 05/06/2000 Document Revised: 07/06/2015 Document Reviewed: 08/27/2012 Elsevier Interactive Patient Education  2017 [place]. Hysteroscopy, Care After Refer to this sheet in the next few weeks. These instructions provide you with information on caring for yourself after your procedure. Your health care provider may also give you more specific instructions. Your treatment has been planned according to current medical practices, but problems sometimes occur. Call your health care provider if you have any problems or questions after your procedure. What can I expect after the procedure? After your procedure, it is typical to have the following:  You may have some cramping. This  normally lasts for a couple days.  You may have bleeding. This can vary from light spotting for a few days to menstrual-like bleeding for 3-7 days.  Follow these instructions at home:  Rest for the first 1-2 days after the procedure.  Only take over-the-counter or prescription medicines as directed by your health care provider. Do not take aspirin. It can increase the chances of bleeding.  Take showers instead of baths for 2 weeks or as directed by your health care provider.  Do not drive for 24 hours or as directed.  Do not drink alcohol while taking pain medicine.  Do not use tampons, douche, or have sexual intercourse for 2 weeks or until your health care provider says it is okay.  Take your temperature twice a day for 4-5 days. Write it down each time.  Follow your health care provider's advice about diet, exercise, and lifting.  If you develop constipation, you may: ? Take a mild laxative if your health care provider approves. ? Add bran foods to your diet. ? Drink enough fluids to keep your urine clear or pale yellow.  Try to have someone with you or available to you for the first 24-48 hours, especially if you were given a general anesthetic.  Follow up with your health care provider as directed. Contact a health care provider if:  You feel dizzy or lightheaded.  You feel sick to your stomach (nauseous).  You have abnormal vaginal discharge.  You have a rash.  You have pain that is not controlled with medicine. Get help right away if:  You have bleeding that is heavier than a normal menstrual period.  You have a fever.  You have increasing cramps or pain, not controlled with medicine.  You have new belly (abdominal) pain.  You pass out.  You have pain in the tops of your  shoulders (shoulder strap areas).  You have shortness of breath. This information is not intended to replace advice given to you by your health care provider. Make sure you discuss any  questions you have with your health care provider. Document Released: 11/18/2012 Document Revised: 07/06/2015 Document Reviewed: 08/27/2012 Elsevier Interactive Patient Education  2017 [place] Anesthesia, Adult General anesthesia is the use of medicines to make a person "go to sleep" (be unconscious) for a medical procedure. General anesthesia is often recommended when a procedure:  Is long.  Requires you to be still or in an unusual position.  Is major and can cause you to lose blood.  Is impossible to do without general anesthesia.  The medicines used for general anesthesia are called general anesthetics. In addition to making you sleep, the medicines:  Prevent pain.  Control your blood pressure.  Relax your muscles.  Tell a health care provider about:  Any allergies you have.  All medicines you are taking, including vitamins, herbs, eye drops, creams, and over-the-counter medicines.  Any problems you or family members have had with anesthetic medicines.  Types of anesthetics you have had in the past.  Any bleeding disorders you have.  Any surgeries you have had.  Any medical conditions you have.  Any history of heart or lung conditions, such as heart failure, sleep apnea, or chronic obstructive pulmonary disease (COPD).  Whether you are pregnant or may be pregnant.  Whether you use tobacco, alcohol, marijuana, or street drugs.  Any history of Armed forces logistics/support/administrative officer.  Any history of depression or anxiety. What are the risks? Generally, this is a safe procedure. However, problems may occur, including:  Allergic reaction to anesthetics.  Lung and heart problems.  Inhaling food or liquids from your stomach into your lungs (aspiration).  Injury to nerves.  Waking up during your procedure and being unable to move (rare).  Extreme agitation or a state of mental confusion (delirium) when you wake up from the anesthetic.  Air in the bloodstream,  which can lead to stroke.  These problems are more likely to develop if you are having a major surgery or if you have an advanced medical condition. You can prevent some of these complications by answering all of your health care provider's questions thoroughly and by following all pre-procedure instructions. General anesthesia can cause side effects, including:  Nausea or vomiting  A sore throat from the breathing tube.  Feeling cold or shivery.  Feeling tired, washed out, or achy.  Sleepiness or drowsiness.  Confusion or agitation.  What happens before the procedure? Staying hydrated Follow instructions from your health care provider about hydration, which may include:  Up to 2 hours before the procedure - you may continue to drink clear liquids, such as water, clear fruit juice, black coffee, and plain tea.  Eating and drinking restrictions Follow instructions from your health care provider about eating and drinking, which may include:  8 hours before the procedure - stop eating heavy meals or foods such as meat, fried foods, or fatty foods.  6 hours before the procedure - stop eating light meals or foods, such as toast or cereal.  6 hours before the procedure - stop drinking milk or drinks that contain milk.  2 hours before the procedure - stop drinking clear liquids.  Medicines  Ask your health care provider about: ? Changing or stopping your regular medicines. This is especially important if you are taking diabetes medicines or blood thinners. ? Taking medicines  such as aspirin and ibuprofen. These medicines can thin your blood. Do not take these medicines before your procedure if your health care provider instructs you not to. ? Taking new dietary supplements or medicines. Do not take these during the week before your procedure unless your health care provider approves them.  If you are told to take a medicine or to continue taking a medicine on the day of the  procedure, take the medicine with sips of water. General instructions   Ask if you will be going home the same day, the following day, or after a longer hospital stay. ? Plan to have someone take you home. ? Plan to have someone stay with you for the first 24 hours after you leave the hospital or clinic.  For 3-6 weeks before the procedure, try not to use any tobacco products, such as cigarettes, chewing tobacco, and e-cigarettes.  You may brush your teeth on the morning of the procedure, but make sure to spit out the toothpaste. What happens during the procedure?  You will be given anesthetics through a mask and through an IV tube in one of your veins.  You may receive medicine to help you relax (sedative).  As soon as you are asleep, a breathing tube may be used to help you breathe.  An anesthesia specialist will stay with you throughout the procedure. He or she will help keep you comfortable and safe by continuing to give you medicines and adjusting the amount of medicine that you get. He or she will also watch your blood pressure, pulse, and oxygen levels to make sure that the anesthetics do not cause any problems.  If a breathing tube was used to help you breathe, it will be removed before you wake up. The procedure may vary among health care providers and hospitals. What happens after the procedure?  You will wake up, often slowly, after the procedure is complete, usually in a recovery area.  Your blood pressure, heart rate, breathing rate, and blood oxygen level will be monitored until the medicines you were given have worn off.  You may be given medicine to help you calm down if you feel anxious or agitated.  If you will be going home the same day, your health care provider may check to make sure you can stand, drink, and urinate.  Your health care providers will treat your pain and side effects before you go home.  Do not drive for 24 hours if you received a  sedative.  You may: ? Feel nauseous and vomit. ? Have a sore throat. ? Have mental slowness. ? Feel cold or shivery. ? Feel sleepy. ? Feel tired. ? Feel sore or achy, even in parts of your body where you did not have surgery. This information is not intended to replace advice given to you by your health care provider. Make sure you discuss any questions you have with your health care provider. Document Released: 05/07/2007 Document Revised: 07/11/2015 Document Reviewed: 01/12/2015 Elsevier Interactive Patient Education  2018 [place] Anesthesia, Adult, Care After These instructions provide you with information about caring for yourself after your procedure. Your health care provider may also give you more specific instructions. Your treatment has been planned according to current medical practices, but problems sometimes occur. Call your health care provider if you have any problems or questions after your procedure. What can I expect after the procedure? After the procedure, it is common to have:  Vomiting.  A sore throat.  Mental slowness.  It is common to feel:  Nauseous.  Cold or shivery.  Sleepy.  Tired.  Sore or achy, even in parts of your body where you did not have surgery.  Follow these instructions at home: For at least 24 hours after the procedure:  Do not: ? Participate in activities where you could fall or become injured. ? Drive. ? Use heavy machinery. ? Drink alcohol. ? Take sleeping pills or medicines that cause drowsiness. ? Make important decisions or sign legal documents. ? Take care of children on your own.  Rest. Eating and drinking  If you vomit, drink water, juice, or soup when you can drink without vomiting.  Drink enough fluid to keep your urine clear or pale yellow.  Make sure you have little or no nausea before eating solid foods.  Follow the diet recommended by your health care provider. General instructions  Have a  responsible adult stay with you until you are awake and alert.  Return to your normal activities as told by your health care provider. Ask your health care provider what activities are safe for you.  Take over-the-counter and prescription medicines only as told by your health care provider.  If you smoke, do not smoke without supervision.  Keep all follow-up visits as told by your health care provider. This is important. Contact a health care provider if:  You continue to have nausea or vomiting at home, and medicines are not helpful.  You cannot drink fluids or start eating again.  You cannot urinate after 8-12 hours.  You develop a skin rash.  You have fever.  You have increasing redness at the site of your procedure. Get help right away if:  You have difficulty breathing.  You have chest pain.  You have unexpected bleeding.  You feel that you are having a life-threatening or urgent problem. This information is not intended to replace advice given to you by your health care provider. Make sure you discuss any questions you have with your health care provider. Document Released: 05/06/2000 Document Revised: 07/03/2015 Document Reviewed: 01/12/2015 Elsevier Interactive Patient Education  [employer].

## 2017-04-02 ENCOUNTER — Encounter (HOSPITAL_COMMUNITY): Payer: Self-pay

## 2017-04-02 ENCOUNTER — Encounter (HOSPITAL_COMMUNITY)
Admission: RE | Admit: 2017-04-02 | Discharge: 2017-04-02 | Disposition: A | Discharge status: Home / Self Care | Payer: BLUE CROSS/BLUE SHIELD | Source: Ambulatory Visit | Attending: Obstetrics and Gynecology | Admitting: Obstetrics and Gynecology

## 2017-04-02 ENCOUNTER — Other Ambulatory Visit: Payer: Self-pay | Admitting: Obstetrics and Gynecology

## 2017-04-02 ENCOUNTER — Other Ambulatory Visit: Payer: Self-pay

## 2017-04-02 DIAGNOSIS — E669 Obesity, unspecified: Secondary | ICD-10-CM | POA: Insufficient documentation

## 2017-04-02 DIAGNOSIS — M1990 Unspecified osteoarthritis, unspecified site: Secondary | ICD-10-CM | POA: Diagnosis not present

## 2017-04-02 DIAGNOSIS — E119 Type 2 diabetes mellitus without complications: Secondary | ICD-10-CM | POA: Diagnosis not present

## 2017-04-02 DIAGNOSIS — Z6841 Body Mass Index (BMI) 40.0 and over, adult: Secondary | ICD-10-CM | POA: Diagnosis not present

## 2017-04-02 DIAGNOSIS — I10 Essential (primary) hypertension: Secondary | ICD-10-CM | POA: Diagnosis not present

## 2017-04-02 DIAGNOSIS — Z79899 Other long term (current) drug therapy: Secondary | ICD-10-CM | POA: Diagnosis not present

## 2017-04-02 DIAGNOSIS — Z01812 Encounter for preprocedural laboratory examination: Secondary | ICD-10-CM | POA: Insufficient documentation

## 2017-04-02 DIAGNOSIS — K219 Gastro-esophageal reflux disease without esophagitis: Secondary | ICD-10-CM | POA: Insufficient documentation

## 2017-04-02 DIAGNOSIS — E876 Hypokalemia: Secondary | ICD-10-CM

## 2017-04-02 HISTORY — DX: Cardiac murmur, unspecified: R01.1

## 2017-04-02 HISTORY — DX: Unspecified asthma, uncomplicated: J45.909

## 2017-04-02 HISTORY — DX: Unspecified osteoarthritis, unspecified site: M19.90

## 2017-04-02 LAB — COMPREHENSIVE METABOLIC PANEL
ALBUMIN: 4.1 g/dL (ref 3.5–5.0)
ALT: 14 U/L (ref 14–54)
ANION GAP: 14 (ref 5–15)
AST: 19 U/L (ref 15–41)
Alkaline Phosphatase: 75 U/L (ref 38–126)
BUN: 13 mg/dL (ref 6–20)
CHLORIDE: 97 mmol/L — AB (ref 101–111)
CO2: 26 mmol/L (ref 22–32)
Calcium: 9.8 mg/dL (ref 8.9–10.3)
Creatinine, Ser: 0.85 mg/dL (ref 0.44–1.00)
GFR calc non Af Amer: >60 mL/min (ref >60)
GLUCOSE: 214 mg/dL — AB (ref 65–99)
Potassium: 2.8 mmol/L — ABNORMAL LOW (ref 3.5–5.1)
Sodium: 137 mmol/L (ref 135–145)
Total Bilirubin: 1.1 mg/dL (ref 0.3–1.2)
Total Protein: 8.2 g/dL — ABNORMAL HIGH (ref 6.5–8.1)

## 2017-04-02 LAB — CBC
HCT: 48.4 % — ABNORMAL HIGH (ref 36.0–46.0)
HEMOGLOBIN: 16 g/dL — AB (ref 12.0–15.0)
MCH: 29.2 pg (ref 26.0–34.0)
MCHC: 33.1 g/dL (ref 30.0–36.0)
MCV: 88.3 fL (ref 78.0–100.0)
PLATELETS: 298 10*3/uL (ref 150–400)
RBC: 5.48 MIL/uL — AB (ref 3.87–5.11)
RDW: 15 % (ref 11.5–15.5)
WBC: 10.4 10*3/uL (ref 4.0–10.5)

## 2017-04-02 LAB — URINALYSIS, ROUTINE W REFLEX MICROSCOPIC
BACTERIA UA: NONE SEEN
BILIRUBIN URINE: NEGATIVE
KETONES UR: NEGATIVE mg/dL
Leukocytes, UA: NEGATIVE
NITRITE: NEGATIVE
PROTEIN: 30 mg/dL — AB
Specific Gravity, Urine: 1.033 — ABNORMAL HIGH (ref 1.005–1.030)
pH: 5 (ref 5.0–8.0)

## 2017-04-02 LAB — GLUCOSE, CAPILLARY: GLUCOSE-CAPILLARY: 237 mg/dL — AB (ref 65–99)

## 2017-04-02 LAB — HCG, SERUM, QUALITATIVE: Preg, Serum: NEGATIVE

## 2017-04-02 LAB — HEMOGLOBIN A1C
Hgb A1c MFr Bld: 9 % — ABNORMAL HIGH (ref 4.8–5.6)
Mean Plasma Glucose: 211.6 mg/dL

## 2017-04-02 MED ORDER — POTASSIUM CHLORIDE CRYS ER 20 MEQ PO TBCR
20.0000 meq | EXTENDED_RELEASE_TABLET | Freq: Three times a day (TID) | ORAL | 1 refills | Status: DC
Start: 2017-04-02 — End: 2017-09-16

## 2017-04-02 NOTE — Progress Notes (Signed)
Sent in Rx for potassium 20 meq tid til surgery.

## 2017-04-02 NOTE — Pre-Procedure Instructions (Signed)
Dr Glo Herring notified of 2.8 potassium and states he will send prescription for potassium. Patient notified of prescription and verbalizes understanding to take this so her surgery will not be canceled or delayed.

## 2017-04-08 ENCOUNTER — Encounter (HOSPITAL_COMMUNITY): Payer: Self-pay | Admitting: *Deleted

## 2017-04-08 ENCOUNTER — Ambulatory Visit (HOSPITAL_COMMUNITY): Payer: BLUE CROSS/BLUE SHIELD | Admitting: Anesthesiology

## 2017-04-08 ENCOUNTER — Encounter (HOSPITAL_COMMUNITY)
Admission: RE | Disposition: A | Discharge status: Home / Self Care | Payer: Self-pay | Source: Ambulatory Visit | Attending: Obstetrics and Gynecology

## 2017-04-08 ENCOUNTER — Ambulatory Visit (HOSPITAL_COMMUNITY)
Admission: RE | Admit: 2017-04-08 | Discharge: 2017-04-08 | Disposition: A | Discharge status: Home / Self Care | Payer: BLUE CROSS/BLUE SHIELD | Source: Ambulatory Visit | Attending: Obstetrics and Gynecology | Admitting: Obstetrics and Gynecology

## 2017-04-08 DIAGNOSIS — E119 Type 2 diabetes mellitus without complications: Secondary | ICD-10-CM | POA: Insufficient documentation

## 2017-04-08 DIAGNOSIS — Z9049 Acquired absence of other specified parts of digestive tract: Secondary | ICD-10-CM | POA: Insufficient documentation

## 2017-04-08 DIAGNOSIS — K5790 Diverticulosis of intestine, part unspecified, without perforation or abscess without bleeding: Secondary | ICD-10-CM | POA: Insufficient documentation

## 2017-04-08 DIAGNOSIS — I10 Essential (primary) hypertension: Secondary | ICD-10-CM | POA: Insufficient documentation

## 2017-04-08 DIAGNOSIS — K648 Other hemorrhoids: Secondary | ICD-10-CM | POA: Insufficient documentation

## 2017-04-08 DIAGNOSIS — Z87891 Personal history of nicotine dependence: Secondary | ICD-10-CM | POA: Insufficient documentation

## 2017-04-08 DIAGNOSIS — N950 Postmenopausal bleeding: Secondary | ICD-10-CM | POA: Diagnosis not present

## 2017-04-08 DIAGNOSIS — Z6841 Body Mass Index (BMI) 40.0 and over, adult: Secondary | ICD-10-CM | POA: Diagnosis not present

## 2017-04-08 DIAGNOSIS — N840 Polyp of corpus uteri: Secondary | ICD-10-CM | POA: Diagnosis not present

## 2017-04-08 DIAGNOSIS — E876 Hypokalemia: Secondary | ICD-10-CM

## 2017-04-08 DIAGNOSIS — R634 Abnormal weight loss: Secondary | ICD-10-CM | POA: Insufficient documentation

## 2017-04-08 DIAGNOSIS — Z79899 Other long term (current) drug therapy: Secondary | ICD-10-CM | POA: Diagnosis not present

## 2017-04-08 DIAGNOSIS — Z7984 Long term (current) use of oral hypoglycemic drugs: Secondary | ICD-10-CM | POA: Diagnosis not present

## 2017-04-08 DIAGNOSIS — J45909 Unspecified asthma, uncomplicated: Secondary | ICD-10-CM | POA: Insufficient documentation

## 2017-04-08 DIAGNOSIS — E7800 Pure hypercholesterolemia, unspecified: Secondary | ICD-10-CM | POA: Diagnosis not present

## 2017-04-08 DIAGNOSIS — N816 Rectocele: Secondary | ICD-10-CM

## 2017-04-08 DIAGNOSIS — Z85828 Personal history of other malignant neoplasm of skin: Secondary | ICD-10-CM | POA: Insufficient documentation

## 2017-04-08 DIAGNOSIS — Z7982 Long term (current) use of aspirin: Secondary | ICD-10-CM | POA: Diagnosis not present

## 2017-04-08 DIAGNOSIS — R9389 Abnormal findings on diagnostic imaging of other specified body structures: Secondary | ICD-10-CM | POA: Diagnosis not present

## 2017-04-08 DIAGNOSIS — Z9884 Bariatric surgery status: Secondary | ICD-10-CM | POA: Diagnosis not present

## 2017-04-08 HISTORY — PX: CERVICAL POLYPECTOMY: SHX88

## 2017-04-08 HISTORY — PX: HYSTEROSCOPY WITH D & C: SHX1775

## 2017-04-08 LAB — POCT I-STAT 4, (NA,K, GLUC, HGB,HCT)
GLUCOSE: 209 mg/dL — AB (ref 65–99)
HCT: 48 % — ABNORMAL HIGH (ref 36.0–46.0)
HEMOGLOBIN: 16.3 g/dL — AB (ref 12.0–15.0)
Potassium: 4.5 mmol/L (ref 3.5–5.1)
Sodium: 136 mmol/L (ref 135–145)

## 2017-04-08 LAB — GLUCOSE, CAPILLARY: Glucose-Capillary: 166 mg/dL — ABNORMAL HIGH (ref 65–99)

## 2017-04-08 SURGERY — DILATATION AND CURETTAGE /HYSTEROSCOPY
Anesthesia: General

## 2017-04-08 MED ORDER — SCOPOLAMINE 1 MG/3DAYS TD PT72
MEDICATED_PATCH | TRANSDERMAL | Status: AC
  Filled 2017-04-08: qty 1

## 2017-04-08 MED ORDER — LACTATED RINGERS IV SOLN
INTRAVENOUS | Status: DC
  Administered 2017-04-08: 1000 mL via INTRAVENOUS

## 2017-04-08 MED ORDER — EPHEDRINE SULFATE 50 MG/ML IJ SOLN
INTRAMUSCULAR | Status: DC | PRN
Start: 2017-04-08 — End: 2017-04-08
  Administered 2017-04-08: 10 mg via INTRAVENOUS
  Administered 2017-04-08: 5 mg via INTRAVENOUS

## 2017-04-08 MED ORDER — FENTANYL CITRATE (PF) 250 MCG/5ML IJ SOLN
INTRAMUSCULAR | Status: AC
  Filled 2017-04-08: qty 5

## 2017-04-08 MED ORDER — FENTANYL CITRATE (PF) 100 MCG/2ML IJ SOLN
INTRAMUSCULAR | Status: DC | PRN
  Administered 2017-04-08: 25 ug via INTRAVENOUS

## 2017-04-08 MED ORDER — SCOPOLAMINE 1 MG/3DAYS TD PT72
1.0000 | MEDICATED_PATCH | TRANSDERMAL | Status: DC
  Administered 2017-04-08: 1.5 mg via TRANSDERMAL

## 2017-04-08 MED ORDER — MIDAZOLAM HCL 2 MG/2ML IJ SOLN
INTRAMUSCULAR | Status: AC
  Filled 2017-04-08: qty 2

## 2017-04-08 MED ORDER — MIDAZOLAM HCL 2 MG/2ML IJ SOLN
1.0000 mg | Freq: Once | INTRAMUSCULAR | Status: AC | PRN
  Administered 2017-04-08: 2 mg via INTRAVENOUS

## 2017-04-08 MED ORDER — LIDOCAINE HCL (CARDIAC) 10 MG/ML IV SOLN
INTRAVENOUS | Status: DC | PRN
  Administered 2017-04-08: 50 mg via INTRAVENOUS
  Administered 2017-04-08: 25 mg via INTRAVENOUS
  Administered 2017-04-08: 150 mg via INTRAVENOUS
  Administered 2017-04-08: 50 mg via INTRAVENOUS

## 2017-04-08 MED ORDER — BUPIVACAINE-EPINEPHRINE 0.5% -1:200000 IJ SOLN
INTRAMUSCULAR | Status: DC | PRN
  Administered 2017-04-08: 10 mL

## 2017-04-08 MED ORDER — LIDOCAINE HCL (PF) 1 % IJ SOLN
INTRAMUSCULAR | Status: AC
  Filled 2017-04-08: qty 5

## 2017-04-08 MED ORDER — MECLIZINE HCL 12.5 MG PO TABS
25.0000 mg | ORAL_TABLET | Freq: Once | ORAL | Status: AC
  Administered 2017-04-08: 25 mg via ORAL
  Filled 2017-04-08: qty 2
  Filled 2017-04-08: qty 1

## 2017-04-08 MED ORDER — BUPIVACAINE-EPINEPHRINE (PF) 0.5% -1:200000 IJ SOLN
INTRAMUSCULAR | Status: AC
  Filled 2017-04-08: qty 30

## 2017-04-08 MED ORDER — ONDANSETRON 4 MG PO TBDP
ORAL_TABLET | ORAL | Status: AC
  Filled 2017-04-08: qty 1

## 2017-04-08 MED ORDER — PROPOFOL 10 MG/ML IV BOLUS
INTRAVENOUS | Status: AC
  Filled 2017-04-08: qty 40

## 2017-04-08 MED ORDER — ONDANSETRON 4 MG PO TBDP
4.0000 mg | ORAL_TABLET | Freq: Once | ORAL | Status: AC
  Administered 2017-04-08: 4 mg via ORAL

## 2017-04-08 MED ORDER — SODIUM CHLORIDE 0.9 % IR SOLN
Status: DC | PRN
  Administered 2017-04-08: 3000 mL

## 2017-04-08 SURGICAL SUPPLY — 28 items
BAG HAMPER (MISCELLANEOUS) ×4 IMPLANT
CLOTH BEACON ORANGE TIMEOUT ST (SAFETY) ×4 IMPLANT
COVER LIGHT HANDLE STERIS (MISCELLANEOUS) ×8 IMPLANT
DECANTER SPIKE VIAL GLASS SM (MISCELLANEOUS) ×4 IMPLANT
ELECT REM PT RETURN 9FT ADLT (ELECTROSURGICAL)
ELECTRODE REM PT RTRN 9FT ADLT (ELECTROSURGICAL) IMPLANT
FORMALIN 10 PREFIL 120ML (MISCELLANEOUS) ×4 IMPLANT
GAUZE SPONGE 4X4 12PLY STRL (GAUZE/BANDAGES/DRESSINGS) ×4 IMPLANT
GLOVE BIOGEL PI IND STRL 7.0 (GLOVE) ×2 IMPLANT
GLOVE BIOGEL PI IND STRL 9 (GLOVE) ×2 IMPLANT
GLOVE BIOGEL PI INDICATOR 7.0 (GLOVE) ×2
GLOVE BIOGEL PI INDICATOR 9 (GLOVE) ×2
GLOVE ECLIPSE 9.0 STRL (GLOVE) ×4 IMPLANT
GOWN SPEC L3 XXLG W/TWL (GOWN DISPOSABLE) ×4 IMPLANT
GOWN STRL REUS W/TWL LRG LVL3 (GOWN DISPOSABLE) ×4 IMPLANT
INST SET HYSTEROSCOPY (KITS) ×4 IMPLANT
IV NS 1000ML (IV SOLUTION) ×2
IV NS 1000ML BAXH (IV SOLUTION) ×2 IMPLANT
IV NS IRRIG 3000ML ARTHROMATIC (IV SOLUTION) ×4 IMPLANT
KIT ROOM TURNOVER APOR (KITS) ×4 IMPLANT
MANIFOLD NEPTUNE II (INSTRUMENTS) ×4 IMPLANT
NS IRRIG 1000ML POUR BTL (IV SOLUTION) ×4 IMPLANT
PACK PERI GYN (CUSTOM PROCEDURE TRAY) ×4 IMPLANT
PAD ARMBOARD 7.5X6 YLW CONV (MISCELLANEOUS) ×4 IMPLANT
PAD TELFA 3X4 1S STER (GAUZE/BANDAGES/DRESSINGS) ×4 IMPLANT
SET BASIN LINEN APH (SET/KITS/TRAYS/PACK) ×4 IMPLANT
SET CYSTO W/LG BORE CLAMP LF (SET/KITS/TRAYS/PACK) ×4 IMPLANT
SYR CONTROL 10ML LL (SYRINGE) ×4 IMPLANT

## 2017-04-08 NOTE — Discharge Instructions (Signed)
Rectocele I printed some visual information about rectocele in case you wish to consider having this corrected  Hysteroscopy Hysteroscopy is a procedure used for looking inside the womb (uterus). It may be done for various reasons, including:  To evaluate abnormal bleeding, fibroid (benign, noncancerous) tumors, polyps, scar tissue (adhesions), and possibly cancer of the uterus.  To look for lumps (tumors) and other uterine growths.  To look for causes of why a woman cannot get pregnant (infertility), causes of recurrent loss of pregnancy (miscarriages), or a lost intrauterine device (IUD).  To perform a sterilization by blocking the fallopian tubes from inside the uterus.  In this procedure, a thin, flexible tube with a tiny light and camera on the end of it (hysteroscope) is used to look inside the uterus. A hysteroscopy should be done right after a menstrual period to be sure you are not pregnant. LET [facility] CARE PROVIDER KNOW ABOUT:  Any allergies you have.  All medicines you are taking, including vitamins, herbs, eye drops, creams, and over-the-counter medicines.  Previous problems you or members of your family have had with the use of anesthetics.  Any blood disorders you have.  Previous surgeries you have had.  Medical conditions you have. RISKS AND COMPLICATIONS Generally, this is a safe procedure. However, as with any procedure, complications can occur. Possible complications include:  Putting a hole in the uterus.  Excessive bleeding.  Infection.  Damage to the cervix.  Injury to other organs.  Allergic reaction to medicines.  Too much fluid used in the uterus for the procedure.  BEFORE THE PROCEDURE  Ask your health care provider about changing or stopping any regular medicines.  Do not take aspirin or blood thinners for 1 week before the procedure, or as directed by your health care provider. These can cause bleeding.  If you smoke, do not smoke  for 2 weeks before the procedure.  In some cases, a medicine is placed in the cervix the day before the procedure. This medicine makes the cervix have a larger opening (dilate). This makes it easier for the instrument to be inserted into the uterus during the procedure.  Do not eat or drink anything for at least 8 hours before the surgery.  Arrange for someone to take you home after the procedure. PROCEDURE  You may be given a medicine to relax you (sedative). You may also be given one of the following: ? A medicine that numbs the area around the cervix (local anesthetic). ? A medicine that makes you sleep through the procedure (general anesthetic).  The hysteroscope is inserted through the vagina into the uterus. The camera on the hysteroscope sends a picture to a TV screen. This gives the surgeon a good view inside the uterus.  During the procedure, air or a liquid is put into the uterus, which allows the surgeon to see better.  Sometimes, tissue is gently scraped from inside the uterus. These tissue samples are sent to a lab for testing. What to expect after the procedure  If you had a general anesthetic, you may be groggy for a couple hours after the procedure.  If you had a local anesthetic, you will be able to go home as soon as you are stable and feel ready.  You may have some cramping. This normally lasts for a couple days.  You may have bleeding, which varies from light spotting for a few days to menstrual-like bleeding for 3-7 days. This is normal.  If your test results  are not back during the visit, make an appointment with your health care provider to find out the results. This information is not intended to replace advice given to you by your health care provider. Make sure you discuss any questions you have with your health care provider. Document Released: 05/06/2000 Document Revised: 07/06/2015 Document Reviewed: 08/27/2012 Elsevier Interactive Patient Education  2017  [employer].

## 2017-04-08 NOTE — Anesthesia Postprocedure Evaluation (Signed)
Anesthesia Post Note  Patient: Amy Tran  Procedure(s) Performed: DILATATION AND CURETTAGE /HYSTEROSCOPY (N/A ) POLYPECTOMY  Patient location during evaluation: Short Stay Anesthesia Type: General Level of consciousness: awake and alert and oriented Pain management: pain level controlled Vital Signs Assessment: post-procedure vital signs reviewed and stable Respiratory status: spontaneous breathing Cardiovascular status: blood pressure returned to baseline and stable Postop Assessment: no apparent nausea or vomiting Anesthetic complications: no     Last Vitals:  Vitals:   04/08/17 0900 04/08/17 0907  BP: 107/68 (!) 113/58  Pulse: (!) 53 (!) 58  Resp: (!) 22 16  Temp:  36.6 C  SpO2: 93% 97%    Last Pain:  Vitals:   04/08/17 0907  TempSrc: Oral                 Jaylie Neaves

## 2017-04-08 NOTE — Brief Op Note (Addendum)
04/08/2017  8:27 AM  PATIENT:  Amy Tran  62 y.o. female  PRE-OPERATIVE DIAGNOSIS:  Postmenopausal bleeding  POST-OPERATIVE DIAGNOSIS:  Postmenopausal bleeding due to endometrial polyp, rectocele  PROCEDURE:  Procedure(s) with comments: DILATATION AND CURETTAGE /HYSTEROSCOPY (N/A) POLYPECTOMY - Endometrial  SURGEON:  Surgeon(s) and Role:    [name], MD - Primary  PHYSICIAN ASSISTANT:   ASSISTANTS: none   ANESTHESIA:   general and paracervical block  EBL:  0 mL   BLOOD ADMINISTERED:none  DRAINS: none   LOCAL MEDICATIONS USED:  MARCAINE    and Amount: 10 ml  SPECIMEN:  Source of Specimen:  Endometrial polyp Findings: Patient had a small endometrial polyp originating from 9:00 on the anterior renal uterine wall removed.  Additionally she had a large RECTOCELE and laxity at the introitus, which is not being addressed at this time DISPOSITION OF SPECIMEN:  PATHOLOGY  COUNTS:  YES  TOURNIQUET:  * No tourniquets in log *  DICTATION: .Dragon Dictation  PLAN OF CARE: Discharge to home after PACU  PATIENT DISPOSITION:  PACU - hemodynamically stable.   Delay start of Pharmacological VTE agent (>24hrs) due to surgical blood loss or risk of bleeding: not applicable Details of procedure: Patient was taken the operating room prepped and draped for vaginal procedure with timeout conducted.  Procedure was confirmed by surgical team.  The speculum was inserted in the vagina the cervix was noted to be somewhat irregularly supported.  The cervix is deviated posteriorly and a little bit to the left.  The uterus was sounded in the anteflexed position to 7 cm.  There was a brief pause in the surgery due to patient bradycardia to 30s responding to atropine and Neo-Synephrine. Paracervical block was then applied using Marcaine solution times 10 cc.  The uterus was dilated to 38 [place] with serial dilators without difficulty, then the hysteroscope introduced identifying the  endometrial cavity including visualization of tubal ostia.  There were no abnormalities except for a single long mobile endometrial polyp with a small stalk originating from the right side of the uterine cavity at 9:00.  The operative biopsy scissors, for the hysteroscope, were used to amputate the polyp off at its stalk.  The specimen was grasped and extracted.  Repeat hysteroscopy confirmed good digital pathology.  The endometrial cavity was smooth and atrophic otherwise.  Patient was then allowed to go to recovery room in stable condition with sponge and needle counts correct

## 2017-04-08 NOTE — Anesthesia Preprocedure Evaluation (Addendum)
Anesthesia Evaluation  Patient identified by MRN, date of birth, ID band Patient awake    History of Anesthesia Complications (+) PONV  Airway Mallampati: I  TM Distance: >3 FB Neck ROM: Full    Dental  (+) Teeth Intact   Pulmonary asthma , Current Smoker,    Pulmonary exam normal        Cardiovascular Exercise Tolerance: Poor hypertension, Pt. on medications Normal cardiovascular exam Rhythm:Regular Rate:Normal  Study Conclusions  - Left ventricle: The cavity size was mildly dilated. Wall   thickness was normal. Systolic function was normal. The estimated   ejection fraction was in the range of 60% to 65%. The study is   not technically sufficient to allow evaluation of LV diastolic   function. - Pulmonary arteries: PA peak pressure: 32 mm Hg (S).  (Dec 2017)  ECG:   Sinus Loletha Grayer   (Dec 2017)   Neuro/Psych    GI/Hepatic Neg liver ROS, GERD  Medicated and Controlled,  Endo/Other  diabetes, Well Controlled, Type 2  Renal/GU negative Renal ROSResults for MORGANNA, STYLES (MRN [id number]) as of 04/08/2017 06:22  04/02/2017 13:25 Potassium: 2.8 (L) Chloride: 97 (L) CO2: 26 Glucose: 214 (H) Mean Plasma Glucose: 211.6 BUN: 13 Creatinine: 0.85      Musculoskeletal  (+) Arthritis ,   Abdominal (+) + obese,   Peds  Hematology Results for BRIETTA, MANSO (MRN [id number]) as of 04/08/2017 06:22  04/02/2017 13:25 WBC: 10.4 RBC: 5.48 (H) Hemoglobin: 16.0 (H) HCT: 48.4 (H) MCV: 88.3 MCH: 29.2 MCHC: 33.1 RDW: 15.0 Platelets: 298    Anesthesia Other Findings   Reproductive/Obstetrics                             Anesthesia Physical Anesthesia Plan  ASA: III  Anesthesia Plan: General   Post-op Pain Management:    Induction:   PONV Risk Score and Plan: Ondansetron, Dexamethasone and Midazolam  Airway Management Planned: LMA  Additional Equipment:   Intra-op Plan:    Post-operative Plan: Extubation in OR  Informed Consent: I have reviewed the patients History and Physical, chart, labs and discussed the procedure including the risks, benefits and alternatives for the proposed anesthesia with the patient or authorized representative who has indicated his/her understanding and acceptance.     Plan Discussed with: CRNA  Anesthesia Plan Comments:         Anesthesia Quick Evaluation

## 2017-04-08 NOTE — Anesthesia Procedure Notes (Signed)
Procedure Name: LMA Insertion Date/Time: 04/08/2017 7:31 AM Performed by: Ollen Bowl, CRNA Pre-anesthesia Checklist: Patient identified, Patient being monitored, Emergency Drugs available, Timeout performed and Suction available Patient Re-evaluated:Patient Re-evaluated prior to induction Oxygen Delivery Method: Circle System Utilized Preoxygenation: Pre-oxygenation with 100% oxygen Induction Type: IV induction Ventilation: Mask ventilation without difficulty LMA: LMA inserted LMA Size: 3.0 Number of attempts: 1 Placement Confirmation: positive ETCO2 and breath sounds checked- equal and bilateral

## 2017-04-08 NOTE — H&P (Signed)
Patient ID: Amy Tran, female   DOB: 08/15/1955, 62 y.o.   MRN: [id number]   [facility] Visit  @DATE @            Patient name: Amy Tran       MRN [id number]  Date of birth: 01-04-1956  CC & HPI:  BREYONNA NAULT is a 62 y.o. female presenting today to discuss the results of her U/S that was done today. She underwent an endometrial biopsy on 03/13/2017 and it was benign. She continues to have postmenopausal bleeding. She also endorses weight loss. She denies fever, chills, or any other symptoms or complaints at this time.   ROS:  ROS +post menopausal bleeding +weight loss -fever -chills All systems are negative except as noted in the HPI and PMH.   Pertinent History Reviewed:   Reviewed:  Medical             Past Medical History:  Diagnosis Date  . Cancer [facility])    multiple skin cancers  . Diabetes mellitus without complication ([place])   . Diverticulosis   . Fluid retention   . High cholesterol   . Hypertension   . Incomplete rotator cuff tear   . PONV (postoperative nausea and vomiting)   . Wears glasses   . Wears partial dentures    bottom                              Surgical Hx:         Past Surgical History:  Procedure Laterality Date  . APPENDECTOMY    . CARPAL TUNNEL RELEASE  2001   left  . CHOLECYSTECTOMY    . COLONOSCOPY  2007   [facility]:ALPFXTK internal hemorrhoids. Diminutive rectal polyp at 10 cm, cold biopsied/removed. The remainder of the rectal mucosa appeared normal Swallow left-sided diverticula. Diminutive polyp at the splenic flexure cold biopsied/removed (adenomatous)  . COLONOSCOPY N/A 12/28/2013   Procedure: COLONOSCOPY;  Surgeon: Daneil Dolin, MD;  [facility];  Service: Endoscopy;  Laterality: N/A;  730 - moved to 8:30 - Ginger notified pt  . ERCP  2002  . GASTRIC BYPASS  2004  . NECK SURGERY  1998  . SHOULDER ARTHROSCOPY WITH SUBACROMIAL DECOMPRESSION Left 06/23/2013   Procedure:  LEFT SHOULDER ARTHROSCOPY WITH DEBRIDEMENT ROTATOR CUFF AND LABRUM;  Surgeon: Lorn Junes, MD;  Location: [place];  Service: Orthopedics;  Laterality: Left;  . SHOULDER SURGERY  1999   left   Medications: Reviewed & Updated - see associated section                       Current Outpatient Medications:  .  acetaminophen (TYLENOL) 500 MG tablet, Take 1,000 mg by mouth every 6 (six) hours as needed., Disp: , Rfl:  .  amLODipine (NORVASC) 5 MG tablet, Take 5 mg by mouth daily., Disp: , Rfl:  .  aspirin EC 81 MG tablet, Take 81 mg by mouth daily., Disp: , Rfl:  .  canagliflozin (INVOKANA) 300 MG TABS tablet, Take 300 mg by mouth daily before breakfast., Disp: , Rfl:  .  [employer] MAGNESIUM OXIDE 500 MG TABS, Take 1 tablet by mouth daily., Disp: , Rfl: 3 .  Cyanocobalamin (VITAMIN B12 PO), Take 1 tablet by mouth daily., Disp: , Rfl:  .  losartan (COZAAR) 100 MG tablet, Take 100 mg by mouth daily., Disp: , Rfl:  .  metFORMIN (GLUCOPHAGE) 1000 MG tablet, Take 1,000 mg by mouth 2 (two) times daily with a meal. , Disp: , Rfl:  .  simvastatin (ZOCOR) 20 MG tablet, Take 20 mg by mouth daily at 6 PM. , Disp: , Rfl:  .  spironolactone (ALDACTONE) 25 MG tablet, Take 25 mg by mouth daily., Disp: , Rfl:  .  torsemide (DEMADEX) 20 MG tablet, TAKE 2 TABS IN AM AND 1 TAB IN PM (Patient taking differently: 60 mg daily. ), Disp: 90 tablet, Rfl: 3   Social History: Reviewed -  reports that she quit smoking about 15 years ago. Her smoking use included cigarettes. She started smoking about 46 years ago. She has a 14.50 pack-year smoking history. she has never used smokeless tobacco.  Objective Findings:  Vitals: Blood pressure 140/78, pulse (!) 54, height 5\' 4"  (1.626 m), weight 265 lb (120.2 kg).  PHYSICAL EXAMINATION General appearance - alert, well appearing, and in no distress, oriented to person, place, and time and overweight Mental status - alert, oriented to person, place, and time,  normal mood, behavior, speech, dress, motor activity, and thought processes, affect appropriate to mood Chest - clear to auscultation, no wheezes, rales or rhonchi, symmetric air entry Heart - normal rate, regular rhythm, normal S1, S2, no murmurs, rubs, clicks or gallops  PELVIC DEFERRED GYNECOLOGIC SONOGRAM   Amy Buckman Woodsonis a 3 [id number], she is here for a pelvic sonogram for postmenopausal bleeding.  Uterus 7.6 x 3.8 x 5.3 cm, vol 79 ml, heterogeneous anteverted uterus  Endometrium 11 mm, symmetrical, homogeneous thickened endometrium  Right ovary 1.3 x .8 x 1.5 cm,simple right ovarian cyst 1.1 x 1.1 x .6 cm   Left ovary 2.8 x 1.4 x 1.9 cm, wnl  No free fluid   Technician Comments:  PELVIC US TA/TV: heterogeneous anteverted uterus,homogeneous thickened endometrium 11 mm,normal left ovary,simple right ovarian cyst 1.1 x 1.1 x .6 cm,no free fluid,no pain during ultrasound,right ovary appears mobile,left ovary was difficult to move because of location     [employer] 03/19/2017 11:28 AM  Clinical Impression and recommendations:  I have reviewed the sonogram results above.  Combined with the patient's current clinical course, below are my impressions and any appropriate recommendations for management based on the sonographic findings:  1. Normally sized uterus for postmenopausal state, with slightly thickened endometrium, Endometrial biopsy indicated. 2 normal bilateral ovaries.  Amy Tran      Discussion: 1. Discussed with pt risks and benefits of D&C and hysteroscopy.  At end of discussion, pt had opportunity to ask questions and has no further questions at this time.   Specific discussion of D&C and hysteroscopy as noted above. Greater than 50% was spent in counseling and coordination of care with the patient.   Total time greater than: 25 minutes.    Assessment  & Plan:   A:  1. Post menopausal bleeding with persistent thickened endometrium by ultrasound  P:  1. D&C and Hysteroscopy scheduled for 04/08/17.   By signing my name below, I, Margit Banda, attest that this documentation has been prepared under the direction and in the presence of Amy Kind, MD. Electronically Signed: Margit Banda, Medical Scribe. 03/19/17. 11:35 AM.  I personally performed the services described in this documentation, which was SCRIBED in my presence. The recorded information has been reviewed and considered accurate. It has been edited as necessary during review. Amy Kind, MD

## 2017-04-08 NOTE — Transfer of Care (Signed)
Immediate Anesthesia Transfer of Care Note  Patient: Amy Tran  Procedure(s) Performed: DILATATION AND CURETTAGE /HYSTEROSCOPY (N/A ) POLYPECTOMY  Patient Location: PACU  Anesthesia Type:General  Level of Consciousness: awake, alert  and oriented  Airway & Oxygen Therapy: Patient Spontanous Breathing  Post-op Assessment: Report given to RN  Post vital signs: Reviewed and stable  Last Vitals:  Vitals:   04/08/17 0710 04/08/17 0715  BP: 110/64 116/69  Pulse:    Resp: (!) 25 (!) 22  Temp:    SpO2: 92% 92%    Last Pain:  Vitals:   04/08/17 0651  TempSrc: Oral      Patients Stated Pain Goal: 9 (16/10/96 0454)  Complications: No apparent anesthesia complications

## 2017-04-08 NOTE — Op Note (Signed)
Please see the brief operative note for surgical details 

## 2017-04-09 ENCOUNTER — Encounter (HOSPITAL_COMMUNITY): Payer: Self-pay | Admitting: Obstetrics and Gynecology

## 2017-04-10 ENCOUNTER — Telehealth: Payer: Self-pay | Admitting: Obstetrics and Gynecology

## 2017-04-10 DIAGNOSIS — N950 Postmenopausal bleeding: Secondary | ICD-10-CM

## 2017-04-10 NOTE — Telephone Encounter (Signed)
Pt informed of benign polyp on pathology.  The patient is doing fine and we will not need a postop visit. During the exam at the time of surgery her rectocele was quite noticeable, but she states that currently she is asymptomatic and is aware of the rectocele and will let us know if it becomes symptomatic

## 2017-04-14 ENCOUNTER — Encounter: Payer: BLUE CROSS/BLUE SHIELD | Admitting: Obstetrics and Gynecology

## 2017-04-14 DIAGNOSIS — H40013 Open angle with borderline findings, low risk, bilateral: Secondary | ICD-10-CM | POA: Insufficient documentation

## 2017-05-06 ENCOUNTER — Other Ambulatory Visit: Payer: Self-pay | Admitting: Cardiology

## 2017-06-02 ENCOUNTER — Other Ambulatory Visit (HOSPITAL_COMMUNITY): Payer: Self-pay | Admitting: Internal Medicine

## 2017-06-02 ENCOUNTER — Ambulatory Visit (HOSPITAL_COMMUNITY)
Admission: RE | Admit: 2017-06-02 | Discharge: 2017-06-02 | Disposition: A | Discharge status: Home / Self Care | Payer: BLUE CROSS/BLUE SHIELD | Source: Ambulatory Visit | Attending: Internal Medicine | Admitting: Internal Medicine

## 2017-06-02 DIAGNOSIS — R059 Cough, unspecified: Secondary | ICD-10-CM

## 2017-06-02 DIAGNOSIS — R05 Cough: Secondary | ICD-10-CM | POA: Diagnosis present

## 2017-06-02 DIAGNOSIS — R918 Other nonspecific abnormal finding of lung field: Secondary | ICD-10-CM | POA: Insufficient documentation

## 2017-06-05 ENCOUNTER — Ambulatory Visit (INDEPENDENT_AMBULATORY_CARE_PROVIDER_SITE_OTHER): Payer: BLUE CROSS/BLUE SHIELD | Admitting: Nurse Practitioner

## 2017-06-05 ENCOUNTER — Encounter: Payer: Self-pay | Admitting: Nurse Practitioner

## 2017-06-05 VITALS — BP 144/81 | HR 53 | Temp 97.0°F | Ht 64.0 in | Wt 264.4 lb

## 2017-06-05 DIAGNOSIS — K219 Gastro-esophageal reflux disease without esophagitis: Secondary | ICD-10-CM

## 2017-06-05 DIAGNOSIS — R1013 Epigastric pain: Secondary | ICD-10-CM | POA: Diagnosis not present

## 2017-06-05 NOTE — Assessment & Plan Note (Signed)
Abdominal pain essentially resolved with adequate control of GERD, as per above.  Follow-up in 1 year to check symptoms and to schedule next needed colonoscopy.

## 2017-06-05 NOTE — Patient Instructions (Signed)
1. Continue taking your current medications. 2. Call us if you have any worsening symptoms or return of your heartburn symptoms. 3. We will have you follow-up in 1 year to check on how you are doing as well as to schedule your next colonoscopy. 4. Call us if you have any questions or concerns   It was great to see you today!  I hope you have a wonderful summer!!!    At [facility] we value your feedback. You may receive a survey about your visit today. Please share your experience as we strive to create trusting relationships with our patients to provide genuine, compassionate, quality care.

## 2017-06-05 NOTE — Progress Notes (Signed)
Referring Provider: Asencion Noble, MD Primary Care Physician:  Asencion Noble, MD Primary GI:  Dr. Gala Romney  Chief Complaint  Patient presents with  . Gastroesophageal Reflux    doing ok    HPI:   Amy Tran is a 62 y.o. female who presents for follow-up on GERD and abdominal pain.  The patient was last seen in our office 12/05/2016 for the same.  Noted history of chronic GERD.  Colonoscopy up-to-date 2015 next due in 2020.  At her last visit she was doing well, Protonix has helped a lot without breakthrough.  No other GI symptoms.  Still on Protonix daily as well as Pepcid.  Rarely uses Carafate but is effective when needed.  No other GI symptoms.  Recommended continue current medications, follow-up in 6 months.  Today she states she's doing well overall. GERD doing well, no breakthrough symptoms. Still on Protonix daily. No longer taking Pepcid. No Carafate needed recently. Denies abdominal pain, N/V, hematochezia, melena, fever, chills. Notes some weight loss, but has cut back on her eating/lifestyle changes. Some residual dyspnea due to recent pneumonia, treated, slowly improving. Denies chest pain, dizziness, lightheadedness, syncope, near syncope. Denies any other upper or lower GI symptoms.  Past Medical History:  Diagnosis Date  . Arthritis   . Asthma   . Cancer [facility])    multiple skin cancers  . Diabetes mellitus without complication ([place])   . Diverticulosis   . Fluid retention   . Heart murmur   . High cholesterol   . Hypertension   . Incomplete rotator cuff tear   . PONV (postoperative nausea and vomiting)   . Wears glasses   . Wears partial dentures    bottom    Past Surgical History:  Procedure Laterality Date  . APPENDECTOMY    . CARPAL TUNNEL RELEASE Bilateral 2001   bilateral  . CERVICAL POLYPECTOMY  04/08/2017   Procedure: POLYPECTOMY;  Surgeon: [name], MD;  Location: [facility];  Service: Gynecology;;  Endometrial  . CHOLECYSTECTOMY    . COLONOSCOPY   2007   [id number] internal hemorrhoids. Diminutive rectal polyp at 10 cm, cold biopsied/removed. The remainder of the rectal mucosa appeared normal Swallow left-sided diverticula. Diminutive polyp at the splenic flexure cold biopsied/removed (adenomatous)  . COLONOSCOPY N/A 12/28/2013   Procedure: COLONOSCOPY;  Surgeon: Daneil Dolin, MD;  Location: [facility];  Service: Endoscopy;  Laterality: N/A;  730 - moved to 8:30 - Ginger notified pt  . DILATION AND CURETTAGE, DIAGNOSTIC / THERAPEUTIC  03/07/2017  . ERCP  2002  . GASTRIC BYPASS  2004  . HYSTEROSCOPY W/D&C N/A 04/08/2017   Procedure: DILATATION AND CURETTAGE /HYSTEROSCOPY;  Surgeon: [name], MD;  Location: [facility];  Service: Gynecology;  Laterality: N/A;  . [place]   fusion  . SHOULDER ARTHROSCOPY WITH SUBACROMIAL DECOMPRESSION Left 06/23/2013   Procedure: LEFT SHOULDER ARTHROSCOPY WITH DEBRIDEMENT ROTATOR CUFF AND LABRUM;  Surgeon: Lorn Junes, MD;  Location: [place];  Service: Orthopedics;  Laterality: Left;  . SHOULDER SURGERY  1999   left    Current Outpatient Medications  Medication Sig Dispense Refill  . acetaminophen (TYLENOL) 500 MG tablet Take 1,000 mg by mouth every 6 (six) hours as needed for moderate pain.     Marland Kitchen albuterol (PROVENTIL HFA;VENTOLIN HFA) 108 (90 Base) MCG/ACT inhaler Inhale 2 puffs into the lungs every 6 (six) hours as needed for wheezing or shortness of breath.    Marland Kitchen  amLODipine (NORVASC) 5 MG tablet Take 5 mg by mouth daily.    Marland Kitchen aspirin EC 81 MG tablet Take 81 mg by mouth daily.    . canagliflozin (INVOKANA) 300 MG TABS tablet Take 300 mg by mouth daily before breakfast.    . [facility] MAGNESIUM OXIDE 500 MG TABS Take 1 tablet by mouth every other day.   3  . Cyanocobalamin 1500 MCG TBCR Take 1,500 mcg by mouth. Sometimes takes daily    . glipiZIDE (GLUCOTROL) 5 MG tablet Take 5 mg by mouth daily.    Marland Kitchen losartan (COZAAR) 100 MG tablet Take 100 mg by mouth daily.    .  metFORMIN (GLUCOPHAGE) 1000 MG tablet Take 1,000 mg by mouth 2 (two) times daily with a meal.     . pantoprazole (PROTONIX) 40 MG tablet Take 40 mg by mouth daily.    . potassium chloride SA (K-DUR,KLOR-CON) 20 MEQ tablet Take 1 tablet (20 mEq total) by mouth 3 (three) times daily. 30 tablet 1  . simvastatin (ZOCOR) 20 MG tablet Take 20 mg by mouth at bedtime.     Marland Kitchen spironolactone (ALDACTONE) 25 MG tablet Take 25 mg by mouth daily.    Marland Kitchen torsemide (DEMADEX) 20 MG tablet TAKE 2 TABS IN AM AND 1 TAB IN PM (Patient taking differently: Take 60 mg by mouth daily. ) 90 tablet 3   No current facility-administered medications for this visit.     Allergies as of 06/05/2017 - Review Complete 06/05/2017  Allergen Reaction Noted  . Penicillins Hives and Rash 04/04/2013    Family History  Problem Relation Age of Onset  . Diabetes Father   . Hypertension Father   . Parkinson's disease Father   . Cancer Father        not sure what kind  . Hypertension Mother   . Heart disease Mother   . Drug abuse Sister   . Mesothelioma Brother   . Cancer Daughter        non-hodgkins lmphoma  . Hypertension Brother   . Diabetes Brother   . Colon cancer Neg Hx   . Gastric cancer Neg Hx   . Esophageal cancer Neg Hx     Social History   Socioeconomic History  . Marital status: Widowed    Spouse name: Not on file  . Number of children: 2  . Years of education: Not on file  . Highest education level: Not on file  Occupational History  . Occupation: works at [employer]: [place]  . Financial resource strain: Not on file  . Food insecurity:    Worry: Not on file    Inability: Not on file  . Transportation needs:    Medical: Not on file    Non-medical: Not on file  Tobacco Use  . Smoking status: Current Every Day Smoker    Packs/day: 1.00    Years: 29.00    Pack years: 29.00    Types: Cigarettes    Start date: 04/09/1970  . Smokeless tobacco: Never Used  Substance and Sexual  Activity  . Alcohol use: No  . Drug use: No  . Sexual activity: Not Currently    Birth control/protection: Post-menopausal  Lifestyle  . Physical activity:    Days per week: Not on file    Minutes per session: Not on file  . Stress: Not on file  Relationships  . Social connections:    Talks on phone: Not on file  Gets together: Not on file    Attends religious service: Not on file    Active member of club or organization: Not on file    Attends meetings of clubs or organizations: Not on file    Relationship status: Not on file  Other Topics Concern  . Not on file  Social History Narrative  . Not on file    Review of Systems: General: Negative for anorexia, weight loss, fever, chills, fatigue, weakness. ENT: Negative for hoarseness, difficulty swallowing. CV: Negative for chest pain, angina, palpitations, peripheral edema.  Respiratory: Negative for dyspnea at rest, cough, sputum, wheezing.  GI: See history of present illness. Endo: Negative for unusual weight change.  Heme: Negative for bruising or bleeding.  Physical Exam: BP (!) 144/81   Pulse (!) 53   Temp (!) 97 F (36.1 C) (Oral)   Ht 5\' 4"  (1.626 m)   Wt 264 lb 6.4 oz (119.9 kg)   BMI 45.38 kg/m  General:   Alert and oriented. Pleasant and cooperative. Well-nourished and well-developed.  Eyes:  Without icterus, sclera clear and conjunctiva pink.  Ears:  Normal auditory acuity. Cardiovascular:  S1, S2 present without murmurs appreciated.Extremities without clubbing or edema. Respiratory:  Clear to auscultation bilaterally. No wheezes, rales, or rhonchi. No distress.  Gastrointestinal:  +BS, soft, non-tender and non-distended. No HSM noted. No guarding or rebound. No masses appreciated.  Rectal:  Deferred  Musculoskalatal:  Symmetrical without gross deformities. Neurologic:  Alert and oriented x4;  grossly normal neurologically. Psych:  Alert and cooperative. Normal mood and affect. Heme/Lymph/Immune: No  excessive bruising noted.    06/05/2017 10:38 AM   Disclaimer: This note was dictated with voice recognition software. Similar sounding words can inadvertently be transcribed and may not be corrected upon review.

## 2017-06-05 NOTE — Assessment & Plan Note (Signed)
GERD currently well controlled on Protonix daily.  She has been able to come off Pepcid and Carafate.  Recommend she continue Protonix, follow-up in 1 year to check symptoms and also to schedule her next due colonoscopy.  Continue current medications otherwise.

## 2017-06-05 NOTE — Progress Notes (Signed)
cc'ed to pcp °

## 2017-06-08 ENCOUNTER — Other Ambulatory Visit: Payer: Self-pay

## 2017-06-08 ENCOUNTER — Encounter (HOSPITAL_COMMUNITY): Payer: Self-pay | Admitting: Emergency Medicine

## 2017-06-08 ENCOUNTER — Emergency Department (HOSPITAL_COMMUNITY)
Admission: EM | Admit: 2017-06-08 | Discharge: 2017-06-08 | Disposition: A | Discharge status: Home / Self Care | Payer: BLUE CROSS/BLUE SHIELD | Attending: Emergency Medicine | Admitting: Emergency Medicine

## 2017-06-08 DIAGNOSIS — B370 Candidal stomatitis: Secondary | ICD-10-CM

## 2017-06-08 DIAGNOSIS — R739 Hyperglycemia, unspecified: Secondary | ICD-10-CM

## 2017-06-08 DIAGNOSIS — Z7984 Long term (current) use of oral hypoglycemic drugs: Secondary | ICD-10-CM | POA: Insufficient documentation

## 2017-06-08 DIAGNOSIS — I809 Phlebitis and thrombophlebitis of unspecified site: Secondary | ICD-10-CM

## 2017-06-08 DIAGNOSIS — I10 Essential (primary) hypertension: Secondary | ICD-10-CM | POA: Insufficient documentation

## 2017-06-08 DIAGNOSIS — E119 Type 2 diabetes mellitus without complications: Secondary | ICD-10-CM | POA: Insufficient documentation

## 2017-06-08 DIAGNOSIS — F17210 Nicotine dependence, cigarettes, uncomplicated: Secondary | ICD-10-CM | POA: Diagnosis not present

## 2017-06-08 DIAGNOSIS — Z7982 Long term (current) use of aspirin: Secondary | ICD-10-CM | POA: Diagnosis not present

## 2017-06-08 DIAGNOSIS — J45909 Unspecified asthma, uncomplicated: Secondary | ICD-10-CM | POA: Diagnosis not present

## 2017-06-08 DIAGNOSIS — Z79899 Other long term (current) drug therapy: Secondary | ICD-10-CM | POA: Insufficient documentation

## 2017-06-08 DIAGNOSIS — M79662 Pain in left lower leg: Secondary | ICD-10-CM | POA: Diagnosis present

## 2017-06-08 DIAGNOSIS — M7989 Other specified soft tissue disorders: Secondary | ICD-10-CM

## 2017-06-08 DIAGNOSIS — E876 Hypokalemia: Secondary | ICD-10-CM

## 2017-06-08 LAB — I-STAT CHEM 8, ED
BUN: 20 mg/dL (ref 6–20)
Calcium, Ion: 1.07 mmol/L — ABNORMAL LOW (ref 1.15–1.40)
Chloride: 83 mmol/L — ABNORMAL LOW (ref 101–111)
Creatinine, Ser: 0.7 mg/dL (ref 0.44–1.00)
Glucose, Bld: 269 mg/dL — ABNORMAL HIGH (ref 65–99)
HEMATOCRIT: 48 % — AB (ref 36.0–46.0)
HEMOGLOBIN: 16.3 g/dL — AB (ref 12.0–15.0)
Potassium: 2.5 mmol/L — CL (ref 3.5–5.1)
Sodium: 132 mmol/L — ABNORMAL LOW (ref 135–145)
TCO2: 35 mmol/L — AB (ref 22–32)

## 2017-06-08 LAB — BASIC METABOLIC PANEL
ANION GAP: 19 — AB (ref 5–15)
BUN: 26 mg/dL — ABNORMAL HIGH (ref 6–20)
CALCIUM: 9.9 mg/dL (ref 8.9–10.3)
CO2: 33 mmol/L — ABNORMAL HIGH (ref 22–32)
Chloride: 78 mmol/L — ABNORMAL LOW (ref 101–111)
Creatinine, Ser: 1.04 mg/dL — ABNORMAL HIGH (ref 0.44–1.00)
GFR, EST NON AFRICAN AMERICAN: 56 mL/min — AB (ref >60)
Glucose, Bld: 427 mg/dL — ABNORMAL HIGH (ref 65–99)
Potassium: 2.6 mmol/L — CL (ref 3.5–5.1)
Sodium: 130 mmol/L — ABNORMAL LOW (ref 135–145)

## 2017-06-08 LAB — CBC
HCT: 47.5 % — ABNORMAL HIGH (ref 36.0–46.0)
HEMOGLOBIN: 16.3 g/dL — AB (ref 12.0–15.0)
MCH: 29.9 pg (ref 26.0–34.0)
MCHC: 34.3 g/dL (ref 30.0–36.0)
MCV: 87 fL (ref 78.0–100.0)
Platelets: 271 10*3/uL (ref 150–400)
RBC: 5.46 MIL/uL — AB (ref 3.87–5.11)
RDW: 13.8 % (ref 11.5–15.5)
WBC: 18.1 10*3/uL — ABNORMAL HIGH (ref 4.0–10.5)

## 2017-06-08 LAB — PROTIME-INR
INR: 0.93
Prothrombin Time: 12.4 seconds (ref 11.4–15.2)

## 2017-06-08 LAB — CBG MONITORING, ED: Glucose-Capillary: 257 mg/dL — ABNORMAL HIGH (ref 65–99)

## 2017-06-08 MED ORDER — FLUCONAZOLE 100 MG PO TABS
100.0000 mg | ORAL_TABLET | Freq: Once | ORAL | Status: AC
  Administered 2017-06-08: 100 mg via ORAL
  Filled 2017-06-08: qty 1

## 2017-06-08 MED ORDER — SODIUM CHLORIDE 0.9 % IV SOLN
INTRAVENOUS | Status: DC
  Administered 2017-06-08: 19:00:00 via INTRAVENOUS

## 2017-06-08 MED ORDER — INSULIN ASPART 100 UNIT/ML ~~LOC~~ SOLN
10.0000 [IU] | Freq: Once | SUBCUTANEOUS | Status: AC
  Administered 2017-06-08: 10 [IU] via SUBCUTANEOUS
  Filled 2017-06-08: qty 1

## 2017-06-08 MED ORDER — POTASSIUM CHLORIDE ER 10 MEQ PO TBCR: 10.0000 meq | EXTENDED_RELEASE_TABLET | Freq: Two times a day (BID) | ORAL | 0 refills | Status: DC

## 2017-06-08 MED ORDER — POTASSIUM CHLORIDE CRYS ER 20 MEQ PO TBCR
40.0000 meq | EXTENDED_RELEASE_TABLET | Freq: Once | ORAL | Status: AC
  Administered 2017-06-08: 40 meq via ORAL
  Filled 2017-06-08: qty 2

## 2017-06-08 MED ORDER — MAGNESIUM SULFATE 2 GM/50ML IV SOLN
2.0000 g | INTRAVENOUS | Status: AC
  Administered 2017-06-08: 2 g via INTRAVENOUS
  Filled 2017-06-08: qty 50

## 2017-06-08 MED ORDER — POTASSIUM CHLORIDE 10 MEQ/100ML IV SOLN
10.0000 meq | Freq: Once | INTRAVENOUS | Status: AC
  Administered 2017-06-08: 10 meq via INTRAVENOUS
  Filled 2017-06-08: qty 100

## 2017-06-08 MED ORDER — RIVAROXABAN (XARELTO) VTE STARTER PACK (15 & 20 MG): 15.0000 mg | ORAL_TABLET | Freq: Every day | ORAL | 0 refills | Status: DC

## 2017-06-08 MED ORDER — RIVAROXABAN 15 MG PO TABS
15.0000 mg | ORAL_TABLET | Freq: Once | ORAL | Status: AC
  Administered 2017-06-08: 15 mg via ORAL
  Filled 2017-06-08 (×2): qty 1

## 2017-06-08 MED ORDER — FLUCONAZOLE 100 MG PO TABS: 100.0000 mg | ORAL_TABLET | Freq: Every day | ORAL | 0 refills | Status: AC

## 2017-06-08 NOTE — ED Provider Notes (Addendum)
[facility] EMERGENCY DEPARTMENT Provider Note   CSN: [id number] Arrival date & time: 06/08/17  1545     History   Chief Complaint Chief Complaint  Patient presents with  . Claudication    HPI Amy Tran is a 62 y.o. female.  The patient is a pleasant 62 year old female, she has no prior history of hypercoagulable state.  She does have a history of some leg swelling on the left which is her chief complaint.  This started over a week ago, has progressively worsened and she is now seeing some red streaking moving up her leg with a lumpy feeling in that area.  The swelling involves her foot but also moves up through her lower extremity in the calf and the knee.  It does not extend above the knee.  She has no chest pain shortness of breath or fevers.  As an additional concern the patient was recently treated with an antibiotic for possible pneumonia after an urgent care visit.  She then went to her family doctor a week later who told him that he did not have pneumonia but probably had bronchitis and gave doxycycline and prednisone.  The patient is a diabetic.  When she went to her family doctor she was already having a thrush feeling in her mouth but this seems to have worsened and now she has a burning sensation with a very dry tongue over the last week which is gradually worsening.  She has been taking her doxycycline, she has not been on any antifungal medications.  The daughter reports that her symptoms have been gradually worsening, mental status has been normal.  Patient denies any other risk factors for DVT.  There is been no surgery, trauma, immobilization, recent surgery, hormone use, travel, prior DVT or PE.  The history is provided by the patient and a relative.  Leg Pain   This is a new problem. The current episode started more than 1 week ago. The problem occurs constantly. The problem has been gradually worsening. The pain is present in the left lower leg. The quality of  the pain is described as aching. The pain is moderate. Associated symptoms include limited range of motion and stiffness. Pertinent negatives include no numbness, no tingling and no itching. The symptoms are aggravated by standing. She has tried nothing for the symptoms. The treatment provided no relief. There has been no history of extremity trauma.    Past Medical History:  Diagnosis Date  . Arthritis   . Asthma   . Cancer [facility])    multiple skin cancers  . Diabetes mellitus without complication ([place])   . Diverticulosis   . Fluid retention   . Heart murmur   . High cholesterol   . Hypertension   . Incomplete rotator cuff tear   . PONV (postoperative nausea and vomiting)   . Wears glasses   . Wears partial dentures    bottom    Patient Active Problem List   Diagnosis Date Noted  . Hypokalemia 04/02/2017  . GERD (gastroesophageal reflux disease) 10/21/2016  . Abdominal pain 10/21/2016  . Extrinsic asthma without complication 76/22/6333  . Oral thrush 07/01/2016  . Rectocele 09/19/2015  . Diverticulosis of colon without hemorrhage   . Hx of adenomatous colonic polyps 12/01/2013  . Pain in joint, shoulder region 06/24/2013  . Muscle weakness (generalized) 06/24/2013  . Incomplete rotator cuff tear   . Diabetes mellitus without complication ([place])   . PONV (postoperative nausea and vomiting)   .  Fluid retention   . Hypertension     Past Surgical History:  Procedure Laterality Date  . APPENDECTOMY    . CARPAL TUNNEL RELEASE Bilateral 2001   bilateral  . CERVICAL POLYPECTOMY  04/08/2017   Procedure: POLYPECTOMY;  Surgeon: [name], MD;  Location: [facility];  Service: Gynecology;;  Endometrial  . CHOLECYSTECTOMY    . COLONOSCOPY  2007   [id number] internal hemorrhoids. Diminutive rectal polyp at 10 cm, cold biopsied/removed. The remainder of the rectal mucosa appeared normal Swallow left-sided diverticula. Diminutive polyp at the splenic flexure cold biopsied/removed  (adenomatous)  . COLONOSCOPY N/A 12/28/2013   Procedure: COLONOSCOPY;  Surgeon: Daneil Dolin, MD;  Location[facility];  Service: Endoscopy;  Laterality: N/A;  730 - moved to 8:30 - Ginger notified pt  . DILATION AND CURETTAGE, DIAGNOSTIC / THERAPEUTIC  03/07/2017  . ERCP  2002  . GASTRIC BYPASS  2004  . HYSTEROSCOPY W/D&C N/A 04/08/2017   Procedure: DILATATION AND CURETTAGE /HYSTEROSCOPY;  Surgeon: [name], MD;  Location: [facility];  Service: Gynecology;  Laterality: N/A;  . [place]   fusion  . SHOULDER ARTHROSCOPY WITH SUBACROMIAL DECOMPRESSION Left 06/23/2013   Procedure: LEFT SHOULDER ARTHROSCOPY WITH DEBRIDEMENT ROTATOR CUFF AND LABRUM;  Surgeon: Lorn Junes, MD;  Location: [place];  Service: Orthopedics;  Laterality: Left;  . SHOULDER SURGERY  1999   left     OB History    Gravida  2   Para  2   Term  2   Preterm      AB      Living  2     SAB      TAB      Ectopic      Multiple      Live Births  2            Home Medications    Prior to Admission medications   Medication Sig Start Date End Date Taking? Authorizing Provider  acetaminophen (TYLENOL) 500 MG tablet Take 1,000 mg by mouth every 6 (six) hours as needed for moderate pain.    Yes [provider]  albuterol (PROVENTIL HFA;VENTOLIN HFA) 108 (90 Base) MCG/ACT inhaler Inhale 2 puffs into the lungs every 6 (six) hours as needed for wheezing or shortness of breath.   Yes [provider]  amLODipine (NORVASC) 5 MG tablet Take 5 mg by mouth daily.   Yes [provider]  aspirin EC 81 MG tablet Take 81 mg by mouth daily.   Yes [provider]  canagliflozin (INVOKANA) 300 MG TABS tablet Take 300 mg by mouth daily before breakfast.   Yes [provider]  [employer] MAGNESIUM OXIDE 500 MG TABS Take 1 tablet by mouth every other day.  05/26/14  Yes [provider]  Cyanocobalamin 1500 MCG TBCR Take 1,500 mcg by mouth  daily.    Yes [provider]  doxycycline (VIBRAMYCIN) 100 MG capsule Take 100 mg by mouth 2 (two) times daily. 10 day course starting on 06/02/2017 06/02/17  Yes [provider]  glipiZIDE (GLUCOTROL) 5 MG tablet Take 5 mg by mouth daily.   Yes [provider]  losartan (COZAAR) 100 MG tablet Take 100 mg by mouth daily.   Yes [provider]  metFORMIN (GLUCOPHAGE) 1000 MG tablet Take 1,000 mg by mouth 2 (two) times daily with a meal.    Yes [provider]  pantoprazole (PROTONIX) 40 MG tablet Take  40 mg by mouth daily.   Yes [provider]  potassium chloride SA (K-DUR,KLOR-CON) 20 MEQ tablet Take 1 tablet (20 mEq total) by mouth 3 (three) times daily. 04/02/17  Yes [name], MD  predniSONE (DELTASONE) 10 MG tablet Take 5-40 mg by mouth See admin instructions. Starting on 06/02/2017, take 4 tablets daily for 3 days, then 3 tabs daily for 3 days, then 2 tabs daily for 3 days, then 1 tab daily for 3 days, then 1/2 tab daily for 3 days, then STOP 06/02/17  Yes [provider]  simvastatin (ZOCOR) 20 MG tablet Take 20 mg by mouth at bedtime.  07/29/14  Yes [provider]  spironolactone (ALDACTONE) 25 MG tablet Take 25 mg by mouth daily.   Yes [provider]  torsemide (DEMADEX) 20 MG tablet TAKE 2 TABS IN AM AND 1 TAB IN PM Patient taking differently: Take 60 mg by mouth daily.  02/02/16  Yes BranchAlphonse Guild, MD  fluconazole (DIFLUCAN) 100 MG tablet Take 1 tablet (100 mg total) by mouth daily for 14 days. 06/08/17 06/22/17  Noemi Chapel, MD  potassium chloride (K-DUR) 10 MEQ tablet Take 1 tablet (10 mEq total) by mouth 2 (two) times daily. 06/08/17   Noemi Chapel, MD  Rivaroxaban 15 & 20 MG TBPK Take 15 mg by mouth daily. Take as directed on package: Start with one 15mg  tablet by mouth twice a day with food. On Day 22, switch to one 20mg  tablet once a day with food.  Starter Pack please 06/08/17   Noemi Chapel,  MD    Family History Family History  Problem Relation Age of Onset  . Diabetes Father   . Hypertension Father   . Parkinson's disease Father   . Cancer Father        not sure what kind  . Hypertension Mother   . Heart disease Mother   . Drug abuse Sister   . Mesothelioma Brother   . Cancer Daughter        non-hodgkins lmphoma  . Hypertension Brother   . Diabetes Brother   . Colon cancer Neg Hx   . Gastric cancer Neg Hx   . Esophageal cancer Neg Hx     Social History Social History   Tobacco Use  . Smoking status: Current Every Day Smoker    Packs/day: 1.00    Years: 29.00    Pack years: 29.00    Types: Cigarettes    Start date: 04/09/1970  . Smokeless tobacco: Never Used  Substance Use Topics  . Alcohol use: No  . Drug use: No     Allergies   Penicillins   Review of Systems Review of Systems  Musculoskeletal: Positive for stiffness.  Skin: Negative for itching.  Neurological: Negative for tingling and numbness.  All other systems reviewed and are negative.    Physical Exam Updated Vital Signs BP (!) 146/81   Pulse (!) 53   Temp 98 F (36.7 C) (Oral)   Resp (!) 23   Ht 5\' 4"  (1.626 m)   Wt 119.7 kg (264 lb)   SpO2 93%   BMI 45.32 kg/m   Physical Exam  Constitutional: She appears well-developed and well-nourished. No distress.  HENT:  Head: Normocephalic and atraumatic.  Mouth/Throat: No oropharyngeal exudate.  Because membranes appear dry, there is a white thrush covering of the tongue and the palate  Eyes: Pupils are equal, round, and reactive to light. Conjunctivae and EOM are normal. Right eye  exhibits no discharge. Left eye exhibits no discharge. No scleral icterus.  Neck: Normal range of motion. Neck supple. No JVD present. No thyromegaly present.  Cardiovascular: Normal rate, regular rhythm, normal heart sounds and intact distal pulses. Exam reveals no gallop and no friction rub.  No murmur heard. Pulmonary/Chest: Effort normal and  breath sounds normal. No respiratory distress. She has no wheezes. She has no rales.  Abdominal: Soft. Bowel sounds are normal. She exhibits no distension and no mass. There is no tenderness.  Musculoskeletal: She exhibits edema and tenderness.  The patient has a red streak with nodularity palpated on the medial and posterior aspect of the left calf extending to the knee.  She does have asymmetric swelling of the legs from the foot up through the knee.  Lymphadenopathy:    She has no cervical adenopathy.  Neurological: She is alert. Coordination normal.  Skin: Skin is warm and dry. No rash noted. No erythema.  Psychiatric: She has a normal mood and affect. Her behavior is normal.  Nursing note and vitals reviewed.    ED Treatments / Results  Labs (all labs ordered are listed, but only abnormal results are displayed) Labs Reviewed  CBC - Abnormal; Notable for the following components:      Result Value   WBC 18.1 (*)    RBC 5.46 (*)    Hemoglobin 16.3 (*)    HCT 47.5 (*)    All other components within normal limits  BASIC METABOLIC PANEL - Abnormal; Notable for the following components:   Sodium 130 (*)    Potassium 2.6 (*)    Chloride 78 (*)    CO2 33 (*)    Glucose, Bld 427 (*)    BUN 26 (*)    Creatinine, Ser 1.04 (*)    GFR calc non Af Amer 56 (*)    Anion gap 19 (*)    All other components within normal limits  CBG MONITORING, ED - Abnormal; Notable for the following components:   Glucose-Capillary 257 (*)    All other components within normal limits  I-STAT CHEM 8, ED - Abnormal; Notable for the following components:   Sodium 132 (*)    Potassium 2.5 (*)    Chloride 83 (*)    Glucose, Bld 269 (*)    Calcium, Ion 1.07 (*)    TCO2 35 (*)    Hemoglobin 16.3 (*)    HCT 48.0 (*)    All other components within normal limits  PROTIME-INR    EKG EKG Interpretation  Date/Time:  Sunday June 08 2017 18:40:11 EDT Ventricular Rate:  88 PR Interval:    QRS  Duration: 93 QT Interval:  335 QTC Calculation: 406 R Axis:   67 Text Interpretation:  Sinus arrhythmia Nonspecific repol abnormality, diffuse leads Since last tracing R wave progression has normalized Abnormal ekg Confirmed by Noemi Chapel [phone]) on 06/08/2017 7:29:04 PM   Radiology No results found.  Procedures Procedures (including critical care time)  Medications Ordered in ED Medications  0.9 %  sodium chloride infusion ( Intravenous New Bag/Given 06/08/17 1903)  Rivaroxaban (XARELTO) tablet 15 mg (15 mg Oral Given 06/08/17 1722)  fluconazole (DIFLUCAN) tablet 100 mg (100 mg Oral Given 06/08/17 1714)  fluconazole (DIFLUCAN) tablet 100 mg (100 mg Oral Given 06/08/17 1714)  potassium chloride 10 mEq in 100 mL IVPB (0 mEq Intravenous Stopped 06/08/17 2010)  insulin aspart (novoLOG) injection 10 Units (10 Units Subcutaneous Given 06/08/17 1859)  magnesium sulfate IVPB 2 g  50 mL (0 g Intravenous Stopped 06/08/17 2302)  potassium chloride SA (K-DUR,KLOR-CON) CR tablet 40 mEq (40 mEq Oral Given 06/08/17 2155)     Initial Impression / Assessment and Plan / ED Course  I have reviewed the triage vital signs and the nursing notes.  Pertinent labs & imaging results that were available during my care of the patient were reviewed by me and considered in my medical decision making (see chart for details).    The patient likely has a superficial thrombophlebitis, this could also be a DVT given the swelling of the leg that she will have a dose of Xarelto, I discussed this with the family at length regarding the risk benefits and alternatives of anticoagulation and they have agreed that anticoagulation is appropriate until an ultrasound is obtained tomorrow morning.  Ultrasound is not available at the time of this visit.  She has no signs or symptoms of pulmonary embolism.  Vital signs reviewed and unremarkable.  She has thrush of the mouth which will be treated with Diflucan as nystatin is on national  back order.  Patient and family member are in agreement that she appears stable for discharge to follow-up in the morning for formal ultrasound.  The patient was informed at length of the risks benefits and alternatives of anticoagulation.  She agreed to these risks and expressed her understanding.  At this time the patient will be anticoagulated with her first dose of Xarelto prior to discharge  Labs resulted after the patient left, potassium was very low at 2.6 sodium 130, glucose 427 not surprising since she has been on prednisone and her chloride was 78.  Her anion gap was 19, she will need some IV fluids and potassium replacement.  I do not think this is DKA as she has no decrease in her CO2 however she likely has some significant electrolyte abnormalities that are causing that increased anion gap.  The patient answered her phone and agreed to come back immediately.  EKG obtained due to hypokalemia  Sodium chloride started however I believe that the patient's hyponatremia is likely a pseudohyponatremia related to her hyperglycemia.  Potassium repleted, IV fluids for her hyperglycemia.  The patient has no muscle spasms, no tremor, normal mental status, no weakness, has been given IV fluids and insulin and has improved with her blood sugar down to 257.  Sodium has corrected slightly to 132, creatinine remains normal, patient stable for discharge, she was given a prescription for more potassium to take at home.  She expressed her understanding to the indications for return and the need for close follow-up for repeat potassium.  Family member at the bedside also voices her understanding and consent  Will return in the morning for an ultrasound of the leg  Final Clinical Impressions(s) / ED Diagnoses   Final diagnoses:  Thrombophlebitis  Leg swelling  Thrush, oral  Hyperglycemia  Hypokalemia    ED Discharge Orders        Ordered    fluconazole (DIFLUCAN) 100 MG tablet  Daily      06/08/17 1708    Rivaroxaban 15 & 20 MG TBPK  Daily     06/08/17 1709    US Venous Img Lower Unilateral Left     06/08/17 1710    potassium chloride (K-DUR) 10 MEQ tablet  2 times daily     06/08/17 1800       Noemi Chapel, MD 06/08/17 1711    Noemi Chapel, MD 06/08/17 [phone]  Noemi Chapel, MD 06/08/17 [phone]

## 2017-06-08 NOTE — ED Triage Notes (Signed)
Patient c/o left leg pain. Per patient pain in calf that radiates behind left knee. Patient reports swelling in calf. Patient also c/o thursh. Patient seen at Urgent Care 4 weeks ago and diagnosed with pneumonia, given an antibiotic. Patient not improving after a week and had pain and ?thrush in mouth, went to PCP and told Fungus given steroid with no improvement. Patient seen by Dr Willey Blade this Monday and given more prednisone and doxycycline for the "fungus" in mouth. Denies any improvement. Patient state burning sensation in mouth.

## 2017-06-08 NOTE — ED Notes (Signed)
Date and time results received: 06/08/17 23:26 (use smartphrase ".now" to insert current time)  Test: potassium 2.5 Critical Value: potassium 2.5  Name of Provider Notified: Dr Sabra Heck  Orders Received? Or Actions Taken?: repeat bmet in lab

## 2017-06-08 NOTE — ED Notes (Signed)
Date and time results received: 06/08/17 6:00 PM  (use smartphrase ".now" to insert current time)  Test: K+ Critical Value: 2.6  Name of Provider Notified: Sabra Heck  Orders Received? Or Actions Taken?: Orders Received - See Orders for details

## 2017-06-08 NOTE — Discharge Instructions (Signed)
Start taking fluconazole, 100 mg by mouth daily for the next 2 weeks to help with your thrush  Come back in the morning for an ultrasound of your leg, if you have a blood clot that needs blood thinners then you should discuss this with your doctor and get the medication filled which I have prescribed called Xarelto. As we discussed this may cause increased bleeding which could potentially be life-threatening.  If you do have bleeding return to the emergency department immediately If you should develop chest pain shortness of breath or worsening symptoms return to the emergency department immediately

## 2017-06-08 NOTE — ED Notes (Signed)
Dr. Sabra Heck made aware of potassium result.  Pt was already discharged and will return tomorrow morning for ultrasound.

## 2017-06-09 ENCOUNTER — Ambulatory Visit (HOSPITAL_COMMUNITY)
Admission: RE | Admit: 2017-06-09 | Discharge: 2017-06-09 | Disposition: A | Discharge status: Home / Self Care | Payer: BLUE CROSS/BLUE SHIELD | Source: Ambulatory Visit | Attending: Emergency Medicine | Admitting: Emergency Medicine

## 2017-06-09 DIAGNOSIS — M7989 Other specified soft tissue disorders: Secondary | ICD-10-CM | POA: Insufficient documentation

## 2017-06-22 ENCOUNTER — Other Ambulatory Visit: Payer: Self-pay | Admitting: Pulmonary Disease

## 2017-08-01 ENCOUNTER — Other Ambulatory Visit: Payer: Self-pay

## 2017-08-01 DIAGNOSIS — R609 Edema, unspecified: Secondary | ICD-10-CM

## 2017-08-28 ENCOUNTER — Ambulatory Visit (INDEPENDENT_AMBULATORY_CARE_PROVIDER_SITE_OTHER): Payer: Medicaid Other | Admitting: Vascular Surgery

## 2017-08-28 ENCOUNTER — Other Ambulatory Visit: Payer: Self-pay

## 2017-08-28 ENCOUNTER — Encounter: Payer: Self-pay | Admitting: *Deleted

## 2017-08-28 ENCOUNTER — Other Ambulatory Visit: Payer: Self-pay | Admitting: *Deleted

## 2017-08-28 ENCOUNTER — Encounter: Payer: Self-pay | Admitting: Vascular Surgery

## 2017-08-28 ENCOUNTER — Other Ambulatory Visit: Payer: Self-pay | Admitting: Vascular Surgery

## 2017-08-28 ENCOUNTER — Ambulatory Visit (HOSPITAL_COMMUNITY)
Admission: RE | Admit: 2017-08-28 | Discharge: 2017-08-28 | Disposition: A | Discharge status: Home / Self Care | Payer: Medicaid Other | Source: Ambulatory Visit | Attending: Vascular Surgery | Admitting: Vascular Surgery

## 2017-08-28 VITALS — BP 128/69 | HR 58 | Temp 97.8°F | Resp 20 | Ht 64.0 in | Wt 266.0 lb

## 2017-08-28 DIAGNOSIS — R609 Edema, unspecified: Secondary | ICD-10-CM

## 2017-08-28 DIAGNOSIS — I82402 Acute embolism and thrombosis of unspecified deep veins of left lower extremity: Secondary | ICD-10-CM

## 2017-08-28 DIAGNOSIS — I82432 Acute embolism and thrombosis of left popliteal vein: Secondary | ICD-10-CM | POA: Insufficient documentation

## 2017-08-28 DIAGNOSIS — I82412 Acute embolism and thrombosis of left femoral vein: Secondary | ICD-10-CM | POA: Diagnosis not present

## 2017-08-28 MED ORDER — RIVAROXABAN (XARELTO) VTE STARTER PACK (15 & 20 MG): ORAL_TABLET | ORAL | 3 refills | Status: DC

## 2017-08-28 NOTE — Progress Notes (Signed)
Referring Physician: Dr Asencion Noble  Patient name: Amy Tran MRN: [id number] DOB: 1955/11/21 Sex: female  REASON FOR CONSULT: Left leg swelling  HPI: Amy Tran is a 62 y.o. female, who has had a chronically swollen left leg for about 9 years.  However recently she noticed that the swelling which used to go away or elevation and rest has now persisted.  The left leg is been chronically swollen for about 8 to 9 weeks.  The swelling never subsides.  She apparently had several ultrasounds which showed no evidence of DVT.  She is on aspirin and a statin.  She denies history of hypercoagulable state or previous DVT.  Chronic medical problems include obesity hypertension diabetes.  These are all currently stable.  She did have a gastric bypass in the past and lost about 250 pounds.  She states she has gained back about 30 to 50 pounds.  She denies any history of hemoptysis intracranial bleed or GI bleeding.  She does not complain of shortness of breath at rest.  Past Medical History:  Diagnosis Date  . Arthritis   . Asthma   . Cancer [facility])    multiple skin cancers  . Diabetes mellitus without complication ([place])   . Diverticulosis   . Fluid retention   . Heart murmur   . High cholesterol   . Hypertension   . Incomplete rotator cuff tear   . PONV (postoperative nausea and vomiting)   . Wears glasses   . Wears partial dentures    bottom   Past Surgical History:  Procedure Laterality Date  . APPENDECTOMY    . CARPAL TUNNEL RELEASE Bilateral 2001   bilateral  . CERVICAL POLYPECTOMY  04/08/2017   Procedure: POLYPECTOMY;  Surgeon: [name], MD;  Location: [facility];  Service: Gynecology;;  Endometrial  . CHOLECYSTECTOMY    . COLONOSCOPY  2007   [id number] internal hemorrhoids. Diminutive rectal polyp at 10 cm, cold biopsied/removed. The remainder of the rectal mucosa appeared normal Swallow left-sided diverticula. Diminutive polyp at the splenic flexure cold  biopsied/removed (adenomatous)  . COLONOSCOPY N/A 12/28/2013   Procedure: COLONOSCOPY;  Surgeon: Daneil Dolin, MD;  Location[facility];  Service: Endoscopy;  Laterality: N/A;  730 - moved to 8:30 - Ginger notified pt  . DILATION AND CURETTAGE, DIAGNOSTIC / THERAPEUTIC  03/07/2017  . ERCP  2002  . GASTRIC BYPASS  2004  . HYSTEROSCOPY W/D&C N/A 04/08/2017   Procedure: DILATATION AND CURETTAGE /HYSTEROSCOPY;  Surgeon: [name], MD;  Location: [facility];  Service: Gynecology;  Laterality: N/A;  . [place]   fusion  . SHOULDER ARTHROSCOPY WITH SUBACROMIAL DECOMPRESSION Left 06/23/2013   Procedure: LEFT SHOULDER ARTHROSCOPY WITH DEBRIDEMENT ROTATOR CUFF AND LABRUM;  Surgeon: Lorn Junes, MD;  Location: [facility];  Service: Orthopedics;  Laterality: Left;  . SHOULDER SURGERY  1999   left    Family History  Problem Relation Age of Onset  . Diabetes Father   . Hypertension Father   . Parkinson's disease Father   . Cancer Father        not sure what kind  . Hypertension Mother   . Heart disease Mother   . Drug abuse Sister   . Mesothelioma Brother   . Cancer Daughter        non-hodgkins lmphoma  . Hypertension Brother   . Diabetes Brother   . Colon cancer Neg Hx   . Gastric cancer  Neg Hx   . Esophageal cancer Neg Hx     SOCIAL HISTORY: Social History   Socioeconomic History  . Marital status: Widowed    Spouse name: Not on file  . Number of children: 2  . Years of education: Not on file  . Highest education level: Not on file  Occupational History  . Occupation: works at [employer]: [place]  . Financial resource strain: Not on file  . Food insecurity:    Worry: Not on file    Inability: Not on file  . Transportation needs:    Medical: Not on file    Non-medical: Not on file  Tobacco Use  . Smoking status: Current Every Day Smoker    Packs/day: 0.50    Years: 29.00    Pack years: 14.50    Types: Cigarettes     Start date: 04/09/1970  . Smokeless tobacco: Never Used  Substance and Sexual Activity  . Alcohol use: No  . Drug use: No  . Sexual activity: Not Currently    Birth control/protection: Post-menopausal  Lifestyle  . Physical activity:    Days per week: Not on file    Minutes per session: Not on file  . Stress: Not on file  Relationships  . Social connections:    Talks on phone: Not on file    Gets together: Not on file    Attends religious service: Not on file    Active member of club or organization: Not on file    Attends meetings of clubs or organizations: Not on file    Relationship status: Not on file  . Intimate partner violence:    Fear of current or ex partner: Not on file    Emotionally abused: Not on file    Physically abused: Not on file    Forced sexual activity: Not on file  Other Topics Concern  . Not on file  Social History Narrative  . Not on file    Allergies  Allergen Reactions  . Penicillins Hives and Rash    Has patient had a PCN reaction causing immediate rash, facial/tongue/throat swelling, SOB or lightheadedness with hypotension: Yes Has patient had a PCN reaction causing severe rash involving mucus membranes or skin necrosis: No Has patient had a PCN reaction that required hospitalization No Has patient had a PCN reaction occurring within the last 10 years: No If all of the above answers are "NO", then may proceed with Cephalosporin use.     Current Outpatient Medications  Medication Sig Dispense Refill  . albuterol (PROVENTIL HFA;VENTOLIN HFA) 108 (90 Base) MCG/ACT inhaler Inhale 2 puffs into the lungs every 6 (six) hours as needed for wheezing or shortness of breath.    Marland Kitchen amLODipine (NORVASC) 5 MG tablet Take 5 mg by mouth daily.    Marland Kitchen aspirin EC 81 MG tablet Take 81 mg by mouth daily.    . canagliflozin (INVOKANA) 300 MG TABS tablet Take 300 mg by mouth daily before breakfast.    . [employer] MAGNESIUM OXIDE 500 MG TABS Take 1 tablet by mouth every  other day.   3  . Cyanocobalamin 1500 MCG TBCR Take 1,500 mcg by mouth daily.     Marland Kitchen doxycycline (VIBRAMYCIN) 100 MG capsule Take 100 mg by mouth 2 (two) times daily. 10 day course starting on 06/02/2017  0  . glipiZIDE (GLUCOTROL) 5 MG tablet Take 5 mg by mouth daily.    Marland Kitchen losartan (COZAAR)  100 MG tablet Take 100 mg by mouth daily.    . metFORMIN (GLUCOPHAGE) 1000 MG tablet Take 1,000 mg by mouth 2 (two) times daily with a meal.     . pantoprazole (PROTONIX) 40 MG tablet Take 40 mg by mouth daily.    . potassium chloride SA (K-DUR,KLOR-CON) 20 MEQ tablet Take 1 tablet (20 mEq total) by mouth 3 (three) times daily. 30 tablet 1  . Rivaroxaban 15 & 20 MG TBPK Take as directed on package: Start with one 15mg  tablet by mouth twice a day with food. On Day 22, switch to one 20mg  tablet once a day with food. 51 each 3  . simvastatin (ZOCOR) 20 MG tablet Take 20 mg by mouth at bedtime.     Marland Kitchen spironolactone (ALDACTONE) 25 MG tablet Take 25 mg by mouth daily.    Marland Kitchen torsemide (DEMADEX) 20 MG tablet TAKE 2 TABS IN AM AND 1 TAB IN PM (Patient taking differently: Take 60 mg by mouth daily. ) 90 tablet 3   No current facility-administered medications for this visit.     ROS:   General:  No weight loss, Fever, chills  HEENT: No recent headaches, no nasal bleeding, no visual changes, no sore throat  Neurologic: No dizziness, blackouts, seizures. No recent symptoms of stroke or mini- stroke. No recent episodes of slurred speech, or temporary blindness.  Cardiac: No recent episodes of chest pain/pressure, no shortness of breath at rest.  No shortness of breath with exertion.  Denies history of atrial fibrillation or irregular heartbeat  Vascular: No history of rest pain in feet.  No history of claudication.  No history of non-healing ulcer, No history of DVT   Pulmonary: No home oxygen, no productive cough, no hemoptysis,  No asthma or wheezing  Musculoskeletal:  [X]  Arthritis, [X]  Low back pain,  [X]  Joint  pain  Hematologic:No history of hypercoagulable state.  No history of easy bleeding.  No history of anemia  Gastrointestinal: No hematochezia or melena,  No gastroesophageal reflux, no trouble swallowing  Urinary: [ ]  chronic Kidney disease, [ ]  on HD - [ ]  MWF or [ ]  TTHS, [ ]  Burning with urination, [ ]  Frequent urination, [ ]  Difficulty urinating;   Skin: No rashes  Psychological: No history of anxiety,  No history of depression   Physical Examination  Vitals:   08/28/17 1331  BP: 128/69  Pulse: (!) 58  Resp: 20  Temp: 97.8 F (36.6 C)  TempSrc: Oral  SpO2: 95%  Weight: 266 lb (120.7 kg)  Height: 5\' 4"  (1.626 m)    Body mass index is 45.66 kg/m.  General:  Alert and oriented, no acute distress HEENT: Normal Neck: No bruit or JVD Pulmonary: Clear to auscultation bilaterally Cardiac: Regular Rate and Rhythm without murmur Abdomen: Soft, non-tender, non-distended, no mass, obese  skin: No rash Extremity Pulses:  2+ radial, brachial, femoral, dorsalis pedis, posterior tibial pulses bilaterally Musculoskeletal: No deformity diffuse left lower extremity edema extending from the foot to the mid thigh Neurologic: Upper and lower extremity motor 5/5 and symmetric  DATA:  Patient had a venous duplex ultrasound in our lab today.  There was some thrombus in the left common femoral profunda and popliteal veins in the saphenofemoral junction.  There is also suggestion in the iliac vein a possible thrombus in the common iliac and external iliac vein.  The IVC was patent.  ASSESSMENT: Acute versus subacute versus possible chronic DVT left leg.  Possible May Thurner syndrome.  PLAN: Patient will be started on Xarelto today.  We will schedule her on September 12, 2017 for possible thrombolyzes possible iliac venous stenting.  Risk benefits possible complications and procedure details were explained to the patient today including not limited to bleeding infection need for possible  hospital admission and ICU stay.  I also discussed with her starting her on Xarelto for at least a 64-month course.  She understands and agrees to proceed.   [name], MD [facility] Office: [phone] Pager: [phone]

## 2017-08-28 NOTE — H&P (View-Only) (Signed)
Referring Physician: Dr Asencion Noble  Patient name: Amy Tran MRN: [id number] DOB: [birth year] Sex: female  REASON FOR CONSULT: Left leg swelling  HPI: Amy Tran is a 62 y.o. female, who has had a chronically swollen left leg for about 9 years.  However recently she noticed that the swelling which used to go away or elevation and rest has now persisted.  The left leg is been chronically swollen for about 8 to 9 weeks.  The swelling never subsides.  She apparently had several ultrasounds which showed no evidence of DVT.  She is on aspirin and a statin.  She denies history of hypercoagulable state or previous DVT.  Chronic medical problems include obesity hypertension diabetes.  These are all currently stable.  She did have a gastric bypass in the past and lost about 250 pounds.  She states she has gained back about 30 to 50 pounds.  She denies any history of hemoptysis intracranial bleed or GI bleeding.  She does not complain of shortness of breath at rest.  Past Medical History:  Diagnosis Date  . Arthritis   . Asthma   . Cancer [facility])    multiple skin cancers  . Diabetes mellitus without complication ([place])   . Diverticulosis   . Fluid retention   . Heart murmur   . High cholesterol   . Hypertension   . Incomplete rotator cuff tear   . PONV (postoperative nausea and vomiting)   . Wears glasses   . Wears partial dentures    bottom   Past Surgical History:  Procedure Laterality Date  . APPENDECTOMY    . CARPAL TUNNEL RELEASE Bilateral 2001   bilateral  . CERVICAL POLYPECTOMY  04/08/2017   Procedure: POLYPECTOMY;  Surgeon: [name], MD;  Location: [facility];  Service: Gynecology;;  Endometrial  . CHOLECYSTECTOMY    . COLONOSCOPY  2007   [id number] internal hemorrhoids. Diminutive rectal polyp at 10 cm, cold biopsied/removed. The remainder of the rectal mucosa appeared normal Swallow left-sided diverticula. Diminutive polyp at the splenic flexure cold  biopsied/removed (adenomatous)  . COLONOSCOPY N/A 12/28/2013   Procedure: COLONOSCOPY;  Surgeon: Daneil Dolin, MD;  [facility];  Service: Endoscopy;  Laterality: N/A;  730 - moved to 8:30 - Ginger notified pt  . DILATION AND CURETTAGE, DIAGNOSTIC / THERAPEUTIC  03/07/2017  . ERCP  2002  . GASTRIC BYPASS  2004  . HYSTEROSCOPY W/D&C N/A 04/08/2017   Procedure: DILATATION AND CURETTAGE /HYSTEROSCOPY;  Surgeon: [name], MD;  Location: [facility];  Service: Gynecology;  Laterality: N/A;  . [place]   fusion  . SHOULDER ARTHROSCOPY WITH SUBACROMIAL DECOMPRESSION Left 06/23/2013   Procedure: LEFT SHOULDER ARTHROSCOPY WITH DEBRIDEMENT ROTATOR CUFF AND LABRUM;  Surgeon: Lorn Junes, MD;  Location: [facility];  Service: Orthopedics;  Laterality: Left;  . SHOULDER SURGERY  1999   left    Family History  Problem Relation Age of Onset  . Diabetes Father   . Hypertension Father   . Parkinson's disease Father   . Cancer Father        not sure what kind  . Hypertension Mother   . Heart disease Mother   . Drug abuse Sister   . Mesothelioma Brother   . Cancer Daughter        non-hodgkins lmphoma  . Hypertension Brother   . Diabetes Brother   . Colon cancer Neg Hx   . Gastric cancer  Neg Hx   . Esophageal cancer Neg Hx     SOCIAL HISTORY: Social History   Socioeconomic History  . Marital status: Widowed    Spouse name: Not on file  . Number of children: 2  . Years of education: Not on file  . Highest education level: Not on file  Occupational History  . Occupation: works at [employer]: [place]  . Financial resource strain: Not on file  . Food insecurity:    Worry: Not on file    Inability: Not on file  . Transportation needs:    Medical: Not on file    Non-medical: Not on file  Tobacco Use  . Smoking status: Current Every Day Smoker    Packs/day: 0.50    Years: 29.00    Pack years: 14.50    Types: Cigarettes     Start date: 04/09/1970  . Smokeless tobacco: Never Used  Substance and Sexual Activity  . Alcohol use: No  . Drug use: No  . Sexual activity: Not Currently    Birth control/protection: Post-menopausal  Lifestyle  . Physical activity:    Days per week: Not on file    Minutes per session: Not on file  . Stress: Not on file  Relationships  . Social connections:    Talks on phone: Not on file    Gets together: Not on file    Attends religious service: Not on file    Active member of club or organization: Not on file    Attends meetings of clubs or organizations: Not on file    Relationship status: Not on file  . Intimate partner violence:    Fear of current or ex partner: Not on file    Emotionally abused: Not on file    Physically abused: Not on file    Forced sexual activity: Not on file  Other Topics Concern  . Not on file  Social History Narrative  . Not on file    Allergies  Allergen Reactions  . Penicillins Hives and Rash    Has patient had a PCN reaction causing immediate rash, facial/tongue/throat swelling, SOB or lightheadedness with hypotension: Yes Has patient had a PCN reaction causing severe rash involving mucus membranes or skin necrosis: No Has patient had a PCN reaction that required hospitalization No Has patient had a PCN reaction occurring within the last 10 years: No If all of the above answers are "NO", then may proceed with Cephalosporin use.     Current Outpatient Medications  Medication Sig Dispense Refill  . albuterol (PROVENTIL HFA;VENTOLIN HFA) 108 (90 Base) MCG/ACT inhaler Inhale 2 puffs into the lungs every 6 (six) hours as needed for wheezing or shortness of breath.    Marland Kitchen amLODipine (NORVASC) 5 MG tablet Take 5 mg by mouth daily.    Marland Kitchen aspirin EC 81 MG tablet Take 81 mg by mouth daily.    . canagliflozin (INVOKANA) 300 MG TABS tablet Take 300 mg by mouth daily before breakfast.    . [employer]S MAGNESIUM OXIDE 500 MG TABS Take 1 tablet by mouth every  other day.   3  . Cyanocobalamin 1500 MCG TBCR Take 1,500 mcg by mouth daily.     Marland Kitchen doxycycline (VIBRAMYCIN) 100 MG capsule Take 100 mg by mouth 2 (two) times daily. 10 day course starting on 06/02/2017  0  . glipiZIDE (GLUCOTROL) 5 MG tablet Take 5 mg by mouth daily.    Marland Kitchen losartan (COZAAR)  100 MG tablet Take 100 mg by mouth daily.    . metFORMIN (GLUCOPHAGE) 1000 MG tablet Take 1,000 mg by mouth 2 (two) times daily with a meal.     . pantoprazole (PROTONIX) 40 MG tablet Take 40 mg by mouth daily.    . potassium chloride SA (K-DUR,KLOR-CON) 20 MEQ tablet Take 1 tablet (20 mEq total) by mouth 3 (three) times daily. 30 tablet 1  . Rivaroxaban 15 & 20 MG TBPK Take as directed on package: Start with one 15mg  tablet by mouth twice a day with food. On Day 22, switch to one 20mg  tablet once a day with food. 51 each 3  . simvastatin (ZOCOR) 20 MG tablet Take 20 mg by mouth at bedtime.     Marland Kitchen spironolactone (ALDACTONE) 25 MG tablet Take 25 mg by mouth daily.    Marland Kitchen torsemide (DEMADEX) 20 MG tablet TAKE 2 TABS IN AM AND 1 TAB IN PM (Patient taking differently: Take 60 mg by mouth daily. ) 90 tablet 3   No current facility-administered medications for this visit.     ROS:   General:  No weight loss, Fever, chills  HEENT: No recent headaches, no nasal bleeding, no visual changes, no sore throat  Neurologic: No dizziness, blackouts, seizures. No recent symptoms of stroke or mini- stroke. No recent episodes of slurred speech, or temporary blindness.  Cardiac: No recent episodes of chest pain/pressure, no shortness of breath at rest.  No shortness of breath with exertion.  Denies history of atrial fibrillation or irregular heartbeat  Vascular: No history of rest pain in feet.  No history of claudication.  No history of non-healing ulcer, No history of DVT   Pulmonary: No home oxygen, no productive cough, no hemoptysis,  No asthma or wheezing  Musculoskeletal:  [X]  Arthritis, [X]  Low back pain,  [X]  Joint  pain  Hematologic:No history of hypercoagulable state.  No history of easy bleeding.  No history of anemia  Gastrointestinal: No hematochezia or melena,  No gastroesophageal reflux, no trouble swallowing  Urinary: [ ]  chronic Kidney disease, [ ]  on HD - [ ]  MWF or [ ]  TTHS, [ ]  Burning with urination, [ ]  Frequent urination, [ ]  Difficulty urinating;   Skin: No rashes  Psychological: No history of anxiety,  No history of depression   Physical Examination  Vitals:   08/28/17 1331  BP: 128/69  Pulse: (!) 58  Resp: 20  Temp: 97.8 F (36.6 C)  TempSrc: Oral  SpO2: 95%  Weight: 266 lb (120.7 kg)  Height: 5\' 4"  (1.626 m)    Body mass index is 45.66 kg/m.  General:  Alert and oriented, no acute distress HEENT: Normal Neck: No bruit or JVD Pulmonary: Clear to auscultation bilaterally Cardiac: Regular Rate and Rhythm without murmur Abdomen: Soft, non-tender, non-distended, no mass, obese  skin: No rash Extremity Pulses:  2+ radial, brachial, femoral, dorsalis pedis, posterior tibial pulses bilaterally Musculoskeletal: No deformity diffuse left lower extremity edema extending from the foot to the mid thigh Neurologic: Upper and lower extremity motor 5/5 and symmetric  DATA:  Patient had a venous duplex ultrasound in our lab today.  There was some thrombus in the left common femoral profunda and popliteal veins in the saphenofemoral junction.  There is also suggestion in the iliac vein a possible thrombus in the common iliac and external iliac vein.  The IVC was patent.  ASSESSMENT: Acute versus subacute versus possible chronic DVT left leg.  Possible May Thurner syndrome.  PLAN: Patient will be started on Xarelto today.  We will schedule her on September 12, 2017 for possible thrombolyzes possible iliac venous stenting.  Risk benefits possible complications and procedure details were explained to the patient today including not limited to bleeding infection need for possible  hospital admission and ICU stay.  I also discussed with her starting her on Xarelto for at least a 59-month course.  She understands and agrees to proceed.   [name], MD [facility] Office: [phone] Pager: [phone]

## 2017-09-03 ENCOUNTER — Other Ambulatory Visit: Payer: Self-pay | Admitting: Pulmonary Disease

## 2017-09-12 ENCOUNTER — Inpatient Hospital Stay (HOSPITAL_COMMUNITY)
Admission: RE | Disposition: A | Discharge status: Home / Self Care | Payer: Self-pay | Source: Home / Self Care | Attending: Vascular Surgery

## 2017-09-12 ENCOUNTER — Inpatient Hospital Stay (HOSPITAL_COMMUNITY)
Admission: RE | Admit: 2017-09-12 | Discharge: 2017-09-17 | DRG: 271 | Disposition: A | Discharge status: Home / Self Care | Payer: Medicaid Other | Attending: Vascular Surgery | Admitting: Vascular Surgery

## 2017-09-12 ENCOUNTER — Encounter (HOSPITAL_COMMUNITY): Payer: Self-pay | Admitting: Vascular Surgery

## 2017-09-12 DIAGNOSIS — Z807 Family history of other malignant neoplasms of lymphoid, hematopoietic and related tissues: Secondary | ICD-10-CM

## 2017-09-12 DIAGNOSIS — F17210 Nicotine dependence, cigarettes, uncomplicated: Secondary | ICD-10-CM | POA: Diagnosis present

## 2017-09-12 DIAGNOSIS — E876 Hypokalemia: Secondary | ICD-10-CM | POA: Diagnosis not present

## 2017-09-12 DIAGNOSIS — J45909 Unspecified asthma, uncomplicated: Secondary | ICD-10-CM | POA: Diagnosis present

## 2017-09-12 DIAGNOSIS — Z833 Family history of diabetes mellitus: Secondary | ICD-10-CM

## 2017-09-12 DIAGNOSIS — Z9884 Bariatric surgery status: Secondary | ICD-10-CM | POA: Diagnosis not present

## 2017-09-12 DIAGNOSIS — I871 Compression of vein: Secondary | ICD-10-CM | POA: Diagnosis present

## 2017-09-12 DIAGNOSIS — Z6841 Body Mass Index (BMI) 40.0 and over, adult: Secondary | ICD-10-CM | POA: Diagnosis not present

## 2017-09-12 DIAGNOSIS — Z8719 Personal history of other diseases of the digestive system: Secondary | ICD-10-CM | POA: Diagnosis not present

## 2017-09-12 DIAGNOSIS — E669 Obesity, unspecified: Secondary | ICD-10-CM | POA: Diagnosis present

## 2017-09-12 DIAGNOSIS — Z8249 Family history of ischemic heart disease and other diseases of the circulatory system: Secondary | ICD-10-CM

## 2017-09-12 DIAGNOSIS — Z981 Arthrodesis status: Secondary | ICD-10-CM | POA: Diagnosis not present

## 2017-09-12 DIAGNOSIS — I82402 Acute embolism and thrombosis of unspecified deep veins of left lower extremity: Secondary | ICD-10-CM | POA: Diagnosis present

## 2017-09-12 DIAGNOSIS — Z7982 Long term (current) use of aspirin: Secondary | ICD-10-CM

## 2017-09-12 DIAGNOSIS — I82422 Acute embolism and thrombosis of left iliac vein: Secondary | ICD-10-CM | POA: Diagnosis present

## 2017-09-12 DIAGNOSIS — E119 Type 2 diabetes mellitus without complications: Secondary | ICD-10-CM | POA: Diagnosis present

## 2017-09-12 DIAGNOSIS — I10 Essential (primary) hypertension: Secondary | ICD-10-CM | POA: Diagnosis present

## 2017-09-12 DIAGNOSIS — Z85828 Personal history of other malignant neoplasm of skin: Secondary | ICD-10-CM | POA: Diagnosis not present

## 2017-09-12 DIAGNOSIS — Z9049 Acquired absence of other specified parts of digestive tract: Secondary | ICD-10-CM

## 2017-09-12 DIAGNOSIS — Z7984 Long term (current) use of oral hypoglycemic drugs: Secondary | ICD-10-CM

## 2017-09-12 DIAGNOSIS — Z880 Allergy status to penicillin: Secondary | ICD-10-CM | POA: Diagnosis not present

## 2017-09-12 DIAGNOSIS — E7800 Pure hypercholesterolemia, unspecified: Secondary | ICD-10-CM | POA: Diagnosis present

## 2017-09-12 DIAGNOSIS — R001 Bradycardia, unspecified: Secondary | ICD-10-CM | POA: Diagnosis not present

## 2017-09-12 DIAGNOSIS — I82512 Chronic embolism and thrombosis of left femoral vein: Secondary | ICD-10-CM | POA: Diagnosis present

## 2017-09-12 HISTORY — PX: PERIPHERAL VASCULAR THROMBECTOMY: CATH118306

## 2017-09-12 HISTORY — PX: CORONARY ULTRASOUND/IVUS: CATH118244

## 2017-09-12 LAB — CBC
HCT: 40.7 % (ref 36.0–46.0)
HEMATOCRIT: 40.4 % (ref 36.0–46.0)
HEMOGLOBIN: 13 g/dL (ref 12.0–15.0)
Hemoglobin: 13.1 g/dL (ref 12.0–15.0)
MCH: 29.2 pg (ref 26.0–34.0)
MCH: 29.3 pg (ref 26.0–34.0)
MCHC: 32.2 g/dL (ref 30.0–36.0)
MCHC: 32.2 g/dL (ref 30.0–36.0)
MCV: 90.8 fL (ref 78.0–100.0)
MCV: 91 fL (ref 78.0–100.0)
PLATELETS: 278 10*3/uL (ref 150–400)
Platelets: 270 10*3/uL (ref 150–400)
RBC: 4.48 MIL/uL (ref 3.87–5.11)
RBC: 4.44 MIL/uL (ref 3.87–5.11)
RDW: 14.3 % (ref 11.5–15.5)
RDW: 14.2 % (ref 11.5–15.5)
WBC: 11.5 10*3/uL — AB (ref 4.0–10.5)
WBC: 7.9 10*3/uL (ref 4.0–10.5)

## 2017-09-12 LAB — POCT I-STAT, CHEM 8
BUN: 11 mg/dL (ref 8–23)
Calcium, Ion: 1.14 mmol/L — ABNORMAL LOW (ref 1.15–1.40)
Chloride: 95 mmol/L — ABNORMAL LOW (ref 98–111)
Creatinine, Ser: 0.6 mg/dL (ref 0.44–1.00)
Glucose, Bld: 175 mg/dL — ABNORMAL HIGH (ref 70–99)
HEMATOCRIT: 44 % (ref 36.0–46.0)
HEMOGLOBIN: 15 g/dL (ref 12.0–15.0)
Potassium: 3.1 mmol/L — ABNORMAL LOW (ref 3.5–5.1)
SODIUM: 140 mmol/L (ref 135–145)
TCO2: 32 mmol/L (ref 22–32)

## 2017-09-12 LAB — POCT ACTIVATED CLOTTING TIME
ACTIVATED CLOTTING TIME: 197 s
Activated Clotting Time: 257 seconds

## 2017-09-12 LAB — FIBRINOGEN
FIBRINOGEN: 484 mg/dL — AB (ref 210–475)
FIBRINOGEN: 499 mg/dL — AB (ref 210–475)

## 2017-09-12 LAB — HEPARIN LEVEL (UNFRACTIONATED)

## 2017-09-12 LAB — GLUCOSE, CAPILLARY: Glucose-Capillary: 172 mg/dL — ABNORMAL HIGH (ref 70–99)

## 2017-09-12 SURGERY — PERIPHERAL VASCULAR THROMBECTOMY
Anesthesia: LOCAL

## 2017-09-12 MED ORDER — LABETALOL HCL 5 MG/ML IV SOLN: 10.0000 mg | INTRAVENOUS | Status: DC | PRN

## 2017-09-12 MED ORDER — HEPARIN (PORCINE) IN NACL 1000-0.9 UT/500ML-% IV SOLN
INTRAVENOUS | Status: AC
  Filled 2017-09-12: qty 1000

## 2017-09-12 MED ORDER — HEPARIN (PORCINE) IN NACL 1000-0.9 UT/500ML-% IV SOLN
INTRAVENOUS | Status: DC | PRN
  Administered 2017-09-12: 500 mL

## 2017-09-12 MED ORDER — ONDANSETRON HCL 4 MG/2ML IJ SOLN
INTRAMUSCULAR | Status: DC | PRN
  Administered 2017-09-12: 4 mg via INTRAVENOUS

## 2017-09-12 MED ORDER — HYDRALAZINE HCL 20 MG/ML IJ SOLN: 5.0000 mg | INTRAMUSCULAR | Status: DC | PRN

## 2017-09-12 MED ORDER — SODIUM CHLORIDE 0.9% FLUSH
3.0000 mL | Freq: Two times a day (BID) | INTRAVENOUS | Status: DC
  Administered 2017-09-12: 3 mL via INTRAVENOUS

## 2017-09-12 MED ORDER — SPIRONOLACTONE 25 MG PO TABS
25.0000 mg | ORAL_TABLET | Freq: Every day | ORAL | Status: DC
  Administered 2017-09-13 – 2017-09-17 (×5): 25 mg via ORAL
  Filled 2017-09-12 (×5): qty 1

## 2017-09-12 MED ORDER — LIDOCAINE HCL (PF) 1 % IJ SOLN
INTRAMUSCULAR | Status: DC | PRN
  Administered 2017-09-12: 20 mL via INTRADERMAL

## 2017-09-12 MED ORDER — SODIUM CHLORIDE 0.9 % IV SOLN
250.0000 mL | INTRAVENOUS | Status: DC | PRN
  Administered 2017-09-12: 250 mL via INTRAVENOUS

## 2017-09-12 MED ORDER — SODIUM CHLORIDE 0.9 % IV SOLN
0.5000 mg/h | INTRAVENOUS | Status: DC
  Administered 2017-09-12: 1 mg/h
  Filled 2017-09-12 (×3): qty 10

## 2017-09-12 MED ORDER — SIMVASTATIN 20 MG PO TABS
20.0000 mg | ORAL_TABLET | Freq: Every day | ORAL | Status: DC
  Administered 2017-09-12 – 2017-09-16 (×5): 20 mg via ORAL
  Filled 2017-09-12 (×5): qty 1

## 2017-09-12 MED ORDER — ONDANSETRON HCL 4 MG/2ML IJ SOLN
4.0000 mg | Freq: Four times a day (QID) | INTRAMUSCULAR | Status: DC | PRN
  Administered 2017-09-16: 4 mg via INTRAVENOUS
  Filled 2017-09-12 (×2): qty 2

## 2017-09-12 MED ORDER — HEPARIN SODIUM (PORCINE) 1000 UNIT/ML IJ SOLN
INTRAMUSCULAR | Status: AC
  Filled 2017-09-12: qty 1

## 2017-09-12 MED ORDER — ACETAMINOPHEN 325 MG PO TABS
650.0000 mg | ORAL_TABLET | ORAL | Status: DC | PRN
  Administered 2017-09-13: 650 mg via ORAL
  Filled 2017-09-12: qty 2

## 2017-09-12 MED ORDER — TENECTEPLASE 50 MG IV KIT: 0.5000 mg/h | PACK | INTRAVENOUS | Status: DC

## 2017-09-12 MED ORDER — HEPARIN (PORCINE) IN NACL 100-0.45 UNIT/ML-% IJ SOLN
2150.0000 [IU]/h | INTRAMUSCULAR | Status: DC
  Administered 2017-09-14: 2050 [IU]/h via INTRAVENOUS
  Filled 2017-09-12 (×4): qty 250

## 2017-09-12 MED ORDER — ALBUTEROL SULFATE (2.5 MG/3ML) 0.083% IN NEBU
3.0000 mL | INHALATION_SOLUTION | RESPIRATORY_TRACT | Status: DC
  Administered 2017-09-12 – 2017-09-13 (×3): 3 mL via RESPIRATORY_TRACT
  Filled 2017-09-12 (×5): qty 3

## 2017-09-12 MED ORDER — OXYCODONE HCL 5 MG PO TABS
5.0000 mg | ORAL_TABLET | ORAL | Status: DC | PRN
Start: 2017-09-12 — End: 2017-09-17
  Administered 2017-09-15 – 2017-09-17 (×4): 10 mg via ORAL
  Filled 2017-09-12 (×4): qty 2

## 2017-09-12 MED ORDER — SODIUM CHLORIDE 0.9 % IV SOLN
INTRAVENOUS | Status: DC
  Administered 2017-09-12 (×2): via INTRAVENOUS

## 2017-09-12 MED ORDER — HEPARIN (PORCINE) IN NACL 100-0.45 UNIT/ML-% IJ SOLN
800.0000 [IU]/h | INTRAMUSCULAR | Status: DC
  Administered 2017-09-12: 800 [IU]/h via INTRAVENOUS
  Filled 2017-09-12: qty 250

## 2017-09-12 MED ORDER — SODIUM CHLORIDE 0.9% FLUSH
3.0000 mL | Freq: Two times a day (BID) | INTRAVENOUS | Status: DC
  Administered 2017-09-12 – 2017-09-16 (×7): 3 mL via INTRAVENOUS

## 2017-09-12 MED ORDER — LIDOCAINE HCL (PF) 1 % IJ SOLN
INTRAMUSCULAR | Status: AC
  Filled 2017-09-12: qty 30

## 2017-09-12 MED ORDER — ASPIRIN EC 81 MG PO TBEC
81.0000 mg | DELAYED_RELEASE_TABLET | Freq: Every day | ORAL | Status: DC
  Administered 2017-09-13 – 2017-09-17 (×5): 81 mg via ORAL
  Filled 2017-09-12 (×5): qty 1

## 2017-09-12 MED ORDER — AMLODIPINE BESYLATE 5 MG PO TABS
5.0000 mg | ORAL_TABLET | Freq: Every day | ORAL | Status: DC
  Administered 2017-09-13 – 2017-09-17 (×5): 5 mg via ORAL
  Filled 2017-09-12 (×5): qty 1

## 2017-09-12 MED ORDER — SODIUM CHLORIDE 0.9% FLUSH: 3.0000 mL | INTRAVENOUS | Status: DC | PRN

## 2017-09-12 MED ORDER — TORSEMIDE 20 MG PO TABS
60.0000 mg | ORAL_TABLET | Freq: Every day | ORAL | Status: DC
  Administered 2017-09-13 – 2017-09-17 (×5): 60 mg via ORAL
  Filled 2017-09-12 (×5): qty 3

## 2017-09-12 MED ORDER — MORPHINE SULFATE (PF) 2 MG/ML IV SOLN
2.0000 mg | INTRAVENOUS | Status: DC | PRN
  Administered 2017-09-15: 2 mg via INTRAVENOUS
  Filled 2017-09-12 (×2): qty 1

## 2017-09-12 MED ORDER — SODIUM CHLORIDE 0.9 % IV SOLN: INTRAVENOUS | Status: AC

## 2017-09-12 MED ORDER — HEPARIN SODIUM (PORCINE) 1000 UNIT/ML IJ SOLN
INTRAMUSCULAR | Status: DC | PRN
  Administered 2017-09-12 (×2): 10000 [IU] via INTRAVENOUS

## 2017-09-12 MED ORDER — CLONIDINE HCL 0.2 MG PO TABS
0.2000 mg | ORAL_TABLET | ORAL | Status: DC | PRN
  Filled 2017-09-12: qty 1

## 2017-09-12 MED ORDER — ONDANSETRON HCL 4 MG/2ML IJ SOLN
INTRAMUSCULAR | Status: AC
  Filled 2017-09-12: qty 2

## 2017-09-12 MED ORDER — MORPHINE SULFATE (PF) 10 MG/ML IV SOLN: 2.0000 mg | INTRAVENOUS | Status: DC | PRN

## 2017-09-12 MED ORDER — SODIUM CHLORIDE 0.9 % IV SOLN: 250.0000 mL | INTRAVENOUS | Status: DC | PRN

## 2017-09-12 SURGICAL SUPPLY — 12 items
BAG SNAP BAND KOVER 36X36 (MISCELLANEOUS) ×3 IMPLANT
CATH PULSE SPRAY 45CMX15CM 5FR (MISCELLANEOUS) ×3 IMPLANT
CATH VISIONS PV .035 IVUS (CATHETERS) ×3 IMPLANT
COVER DOME SNAP 22 D (MISCELLANEOUS) ×3 IMPLANT
PACK CARDIAC CATHETERIZATION (CUSTOM PROCEDURE TRAY) ×3 IMPLANT
PROTECTION STATION PRESSURIZED (MISCELLANEOUS) ×3
SET ZELANTE DVT THROMB (CATHETERS) ×3 IMPLANT
SHEATH PINNACLE 8F 10CM (SHEATH) ×3 IMPLANT
SHEATH PROBE COVER 6X72 (BAG) ×3 IMPLANT
STATION PROTECTION PRESSURIZED (MISCELLANEOUS) ×2 IMPLANT
WIRE HITORQ VERSACORE ST 145CM (WIRE) ×3 IMPLANT
WIRE VERSACORE LOC 115CM (WIRE) ×3 IMPLANT

## 2017-09-12 NOTE — Progress Notes (Signed)
ANTICOAGULATION CONSULT NOTE - Initial Consult  Pharmacy Consult for Heparin Management Indication: DVT  Allergies  Allergen Reactions  . Penicillins Hives and Rash    Has patient had a PCN reaction causing immediate rash, facial/tongue/throat swelling, SOB or lightheadedness with hypotension: Yes Has patient had a PCN reaction causing severe rash involving mucus membranes or skin necrosis: No Has patient had a PCN reaction that required hospitalization No Has patient had a PCN reaction occurring within the last 10 years: No If all of the above answers are "NO", then may proceed with Cephalosporin use.     Patient Measurements: Height: 5\' 4"  (162.6 cm) Weight: 260 lb (117.9 kg) IBW/kg (Calculated) : 54.7 HEPARIN DW (KG): 83.2   Vital Signs: Temp: 97.8 F (36.6 C) (08/02 1300) Temp Source: Oral (08/02 1300) BP: 161/79 (08/02 1220) Pulse Rate: 0 (08/02 1235)  Labs: Recent Labs    09/12/17 1000  HGB 15.0  HCT 44.0  CREATININE 0.60    Estimated Creatinine Clearance: 92.1 mL/min (by C-G formula based on SCr of 0.6 mg/dL).   Medical History: Past Medical History:  Diagnosis Date  . Arthritis   . Asthma   . Cancer [facility])    multiple skin cancers  . Diabetes mellitus without complication ([place])   . Diverticulosis   . Fluid retention   . Heart murmur   . High cholesterol   . Hypertension   . Incomplete rotator cuff tear   . PONV (postoperative nausea and vomiting)   . Wears glasses   . Wears partial dentures    bottom    Medications:  Scheduled:  . albuterol  3 mL Inhalation Q4H  . [START ON 09/13/2017] amLODipine  5 mg Oral Daily  . [START ON 09/13/2017] aspirin EC  81 mg Oral Daily  . simvastatin  20 mg Oral QHS  . sodium chloride flush  3 mL Intravenous Q12H  . sodium chloride flush  3 mL Intravenous Q12H  . spironolactone  25 mg Oral Daily  . torsemide  60 mg Oral Daily   Infusions:  . sodium chloride    . sodium chloride 125 mL/hr at 09/12/17 1331  .  sodium chloride    . alteplase (LIMB ISCHEMIA) 10 mg in normal saline (0.02 mg/mL) infusion 0.5 mg/hr (09/12/17 1215)  . heparin 800 Units/hr (09/12/17 1218)    Assessment: Ms. Gillette is a 89 yoF who presented for thrombolysis of  LLE DVT. Patient started on heparin 800 units/hr in the OR at ~1130. Patient also on alteplase 25 mL/hr.   Goal of Therapy:  Heparin level 0.2-0.5 units/ml Monitor platelets by anticoagulation protocol: Yes   Plan:  Continue heparin infusion at 800 units/hr. Will check heparin level 6 hours after initiation  Will check daily heparin levels and CBC.  Claiborne Billings, PharmD PGY2 Cardiology Pharmacy Resident Phone [phone] 09/12/2017 1:57 PM

## 2017-09-12 NOTE — Interval H&P Note (Signed)
History and Physical Interval Note:  09/12/2017 9:03 AM  Amy Tran  has presented today for surgery, with the diagnosis of LLE DVT  The various methods of treatment have been discussed with the patient and family. After consideration of risks, benefits and other options for treatment, the patient has consented to  Procedure(s): PERIPHERAL VASCULAR THROMBECTOMY (Left) INTRAVASCULAR ULTRASOUND/IVUS (N/A) as a surgical intervention .  The patient's history has been reviewed, patient examined, no change in status, stable for surgery.  I have reviewed the patient's chart and labs.  Questions were answered to the patient's satisfaction.     [name]

## 2017-09-12 NOTE — Progress Notes (Signed)
ANTICOAGULATION CONSULT NOTE - Follow-Up Consult  Pharmacy Consult for Heparin Management Indication: DVT  Allergies  Allergen Reactions  . Penicillins Hives and Rash    Has patient had a PCN reaction causing immediate rash, facial/tongue/throat swelling, SOB or lightheadedness with hypotension: Yes Has patient had a PCN reaction causing severe rash involving mucus membranes or skin necrosis: No Has patient had a PCN reaction that required hospitalization No Has patient had a PCN reaction occurring within the last 10 years: No If all of the above answers are "NO", then may proceed with Cephalosporin use.     Patient Measurements: Height: 5\' 4"  (162.6 cm) Weight: 260 lb (117.9 kg) IBW/kg (Calculated) : 54.7 HEPARIN DW (KG): 83.2   Vital Signs: Temp: 98.6 F (37 C) (08/02 1945) Temp Source: Oral (08/02 1500) BP: 150/78 (08/02 1700) Pulse Rate: 51 (08/02 1700)  Labs: Recent Labs    09/12/17 1000 09/12/17 1334 09/12/17 1922  HGB 15.0 13.1 13.0  HCT 44.0 40.7 40.4  PLT  --  278 270  HEPARINUNFRC  --   --  <0.10*  CREATININE 0.60  --   --     Estimated Creatinine Clearance: 92.1 mL/min (by C-G formula based on SCr of 0.6 mg/dL).   Medical History: Past Medical History:  Diagnosis Date  . Arthritis   . Asthma   . Cancer [facility])    multiple skin cancers  . Diabetes mellitus without complication ([place])   . Diverticulosis   . Fluid retention   . Heart murmur   . High cholesterol   . Hypertension   . Incomplete rotator cuff tear   . PONV (postoperative nausea and vomiting)   . Wears glasses   . Wears partial dentures    bottom    Medications:  Scheduled:  . albuterol  3 mL Inhalation Q4H  . [START ON 09/13/2017] amLODipine  5 mg Oral Daily  . [START ON 09/13/2017] aspirin EC  81 mg Oral Daily  . simvastatin  20 mg Oral QHS  . sodium chloride flush  3 mL Intravenous Q12H  . sodium chloride flush  3 mL Intravenous Q12H  . spironolactone  25 mg Oral Daily  .  torsemide  60 mg Oral Daily   Infusions:  . sodium chloride    . sodium chloride    . alteplase (LIMB ISCHEMIA) 10 mg in normal saline (0.02 mg/mL) infusion 0.5 mg/hr (09/12/17 1215)  . heparin 800 Units/hr (09/12/17 1218)    Assessment: Amy Tran is a 78 yoF who presented for thrombolysis of  LLE DVT. Patient started on heparin 800 units/hr in the OR at ~1130. Patient also on alteplase 25 mL/hr.   Initial heparin level undetectable, no issues with line per RN. Will avoid bolus given concomitant alteplase.  Goal of Therapy:  Heparin level 0.2-0.5 units/ml Monitor platelets by anticoagulation protocol: Yes   Plan:  -Increase heparin to 1000 units/hr -Recheck 6hr heparin level  Arrie Senate, PharmD, BCPS Clinical Pharmacist [phone] Please check AMION for all [place] numbers 09/12/2017

## 2017-09-12 NOTE — Op Note (Signed)
Procedure: Ultrasound left groin, left femoral vein cannulation, IVUS of inferior vena cava left common and external iliac vein, AngioJet Zel[place] take catheter left iliac vein, placement of multi sidehole catheter for iliac vein thrombosis  Preoperative diagnosis: Left iliac vein thrombosis  Postoperative diagnosis: Same  Anesthesia local  Operative findings: Acute versus chronic thrombus with left common and external iliac vein  #2 8 French sheath left common femoral vein  3.  Placement of UniFuse short TPA catheter left iliac vein  Operative details: After obtaining informed consent, the patient was brought to the [facility] lab.  The patient was placed in supine position Angio table.  Both groins were prepped and draped in usual sterile fashion.  Local anesthesia was inflated of the left common femoral vein.  Ultrasound used to identify the left common femoral vein which was patent.  An image was scanned and a copy obtained for the permanent medical record.  The left common femoral vein was cannulated under direct ultrasound guidance.  An 035 versacore wire was then placed into the inferior vena cava under for scopic guidance.  An 8 French short sheath was placed in the left common femoral vein and thoroughly flushed with heparinized saline.  An IVUS catheter was then advanced over the guidewire into the inferior vena cava.  Real-time IVUS recording was performed on pullback of the catheter.  There was thrombus within the left common and external iliac vein.  At this point the IVIS catheter was removed and the [place] today the Angio jet system was brought up on the field and advanced over the guidewire.  I began to use this along take catheter in the area of the origin the left common iliac vein.  At this point the patient experienced bradycardia to the point of a brief episode of asystole.  She did not lose consciousness.  She also desatted into the low 80s.  This gradually improved over the next 15 to 20  minutes with her heart rate stabilizing in the low 60s and oxygen saturation levels returning to 100% on 6 L of oxygen.  She did experience some chest tightness and this also resolved.  We aborted using the AngioJet catheter.  A multi sidehole infusion catheter was placed in the left common femoral vein up into the distal common iliac vein extending into the external iliac vein.  This was then connected to tenecteplase running at 0.5 mg/h.  A heparin drip was connected to the side-port of 800 units/h.  The patient will have thrombolysis of her iliac vein overnight with a follow-up study in the morning.  The patient was stable on transfer to the intensive care unit.  [name], MD [facility] Office: [phone] Pager: [phone]

## 2017-09-12 NOTE — Anesthesia Preprocedure Evaluation (Addendum)
Anesthesia Evaluation  Patient identified by MRN, date of birth, ID band Patient awake    Reviewed: Allergy & Precautions, NPO status , Patient's Chart, lab work & pertinent test results  Airway Mallampati: II  TM Distance: >3 FB     Dental   Pulmonary asthma , Current Smoker,    breath sounds clear to auscultation       Cardiovascular hypertension,  Rhythm:Regular Rate:Normal     Neuro/Psych    GI/Hepatic GERD  ,  Endo/Other  diabetes  Renal/GU      Musculoskeletal   Abdominal   Peds  Hematology   Anesthesia Other Findings   Reproductive/Obstetrics                            Anesthesia Physical Anesthesia Plan  ASA: III  Anesthesia Plan: MAC   Post-op Pain Management:    Induction: Intravenous  PONV Risk Score and Plan: 1 and Treatment may vary due to age or medical condition  Airway Management Planned: Nasal Cannula and Simple Face Mask  Additional Equipment:   Intra-op Plan:   Post-operative Plan: Possible Post-op intubation/ventilation  Informed Consent: I have reviewed the patients History and Physical, chart, labs and discussed the procedure including the risks, benefits and alternatives for the proposed anesthesia with the patient or authorized representative who has indicated his/her understanding and acceptance.   Dental advisory given  Plan Discussed with: Anesthesiologist, CRNA and Surgeon  Anesthesia Plan Comments:        Anesthesia Quick Evaluation

## 2017-09-13 ENCOUNTER — Encounter (HOSPITAL_COMMUNITY)
Admission: RE | Disposition: A | Discharge status: Home / Self Care | Payer: Self-pay | Source: Home / Self Care | Attending: Vascular Surgery

## 2017-09-13 ENCOUNTER — Inpatient Hospital Stay (HOSPITAL_COMMUNITY): Payer: Medicaid Other | Admitting: Certified Registered"

## 2017-09-13 HISTORY — PX: PERCUTANEOUS VENOUS THROMBECTOMY,LYSIS WITH INTRAVASCULAR ULTRASOUND (IVUS): SHX6751

## 2017-09-13 LAB — CBC
HCT: 38.8 % (ref 36.0–46.0)
HCT: 38.5 % (ref 36.0–46.0)
HEMOGLOBIN: 12.3 g/dL (ref 12.0–15.0)
Hemoglobin: 12.4 g/dL (ref 12.0–15.0)
MCH: 29.2 pg (ref 26.0–34.0)
MCH: 29.4 pg (ref 26.0–34.0)
MCHC: 32 g/dL (ref 30.0–36.0)
MCHC: 31.9 g/dL (ref 30.0–36.0)
MCV: 91.3 fL (ref 78.0–100.0)
MCV: 92.1 fL (ref 78.0–100.0)
Platelets: 245 10*3/uL (ref 150–400)
Platelets: 239 10*3/uL (ref 150–400)
RBC: 4.25 MIL/uL (ref 3.87–5.11)
RBC: 4.18 MIL/uL (ref 3.87–5.11)
RDW: 14.2 % (ref 11.5–15.5)
RDW: 14.2 % (ref 11.5–15.5)
WBC: 9.6 10*3/uL (ref 4.0–10.5)
WBC: 9.3 10*3/uL (ref 4.0–10.5)

## 2017-09-13 LAB — BASIC METABOLIC PANEL
ANION GAP: 6 (ref 5–15)
BUN: 8 mg/dL (ref 8–23)
CALCIUM: 8.4 mg/dL — AB (ref 8.9–10.3)
CO2: 30 mmol/L (ref 22–32)
CREATININE: 0.67 mg/dL (ref 0.44–1.00)
Chloride: 105 mmol/L (ref 98–111)
GFR calc Af Amer: >60 mL/min (ref >60)
GFR calc non Af Amer: >60 mL/min (ref >60)
GLUCOSE: 154 mg/dL — AB (ref 70–99)
Potassium: 3 mmol/L — ABNORMAL LOW (ref 3.5–5.1)
Sodium: 141 mmol/L (ref 135–145)

## 2017-09-13 LAB — GLUCOSE, CAPILLARY
GLUCOSE-CAPILLARY: 153 mg/dL — AB (ref 70–99)
GLUCOSE-CAPILLARY: 141 mg/dL — AB (ref 70–99)
GLUCOSE-CAPILLARY: 137 mg/dL — AB (ref 70–99)
GLUCOSE-CAPILLARY: 177 mg/dL — AB (ref 70–99)
GLUCOSE-CAPILLARY: 156 mg/dL — AB (ref 70–99)

## 2017-09-13 LAB — FIBRINOGEN
FIBRINOGEN: 450 mg/dL (ref 210–475)
Fibrinogen: 425 mg/dL (ref 210–475)

## 2017-09-13 LAB — POCT ACTIVATED CLOTTING TIME: ACTIVATED CLOTTING TIME: 109 s

## 2017-09-13 LAB — HEPARIN LEVEL (UNFRACTIONATED)
Heparin Unfractionated: <0.1 IU/mL — ABNORMAL LOW (ref 0.30–0.70)
Heparin Unfractionated: <0.1 IU/mL — ABNORMAL LOW (ref 0.30–0.70)

## 2017-09-13 SURGERY — THROMBECTOMY, VEIN, PERCUTANEOUS
Anesthesia: General | Laterality: Left

## 2017-09-13 MED ORDER — FENTANYL CITRATE (PF) 250 MCG/5ML IJ SOLN
INTRAMUSCULAR | Status: AC
  Filled 2017-09-13: qty 5

## 2017-09-13 MED ORDER — GLYCOPYRROLATE 0.2 MG/ML IJ SOLN
INTRAMUSCULAR | Status: DC | PRN
  Administered 2017-09-13: 0.2 mg via INTRAVENOUS

## 2017-09-13 MED ORDER — FENTANYL CITRATE (PF) 100 MCG/2ML IJ SOLN: 25.0000 ug | INTRAMUSCULAR | Status: DC | PRN

## 2017-09-13 MED ORDER — IODIXANOL 320 MG/ML IV SOLN
INTRAVENOUS | Status: DC | PRN
  Administered 2017-09-13: 150 mL via INTRAVENOUS

## 2017-09-13 MED ORDER — SODIUM CHLORIDE 0.9 % IV SOLN
INTRAVENOUS | Status: AC
  Filled 2017-09-13: qty 1.2

## 2017-09-13 MED ORDER — ROCURONIUM BROMIDE 10 MG/ML (PF) SYRINGE
PREFILLED_SYRINGE | INTRAVENOUS | Status: AC
  Filled 2017-09-13: qty 10

## 2017-09-13 MED ORDER — LIDOCAINE HCL (PF) 1 % IJ SOLN
INTRAMUSCULAR | Status: AC
  Filled 2017-09-13: qty 30

## 2017-09-13 MED ORDER — ALBUTEROL SULFATE (2.5 MG/3ML) 0.083% IN NEBU: 3.0000 mL | INHALATION_SOLUTION | Freq: Four times a day (QID) | RESPIRATORY_TRACT | Status: DC | PRN

## 2017-09-13 MED ORDER — 0.9 % SODIUM CHLORIDE (POUR BTL) OPTIME
TOPICAL | Status: DC | PRN
  Administered 2017-09-13: 1000 mL

## 2017-09-13 MED ORDER — LIDOCAINE HCL (PF) 1 % IJ SOLN
INTRAMUSCULAR | Status: DC | PRN
  Administered 2017-09-13: 30 mL

## 2017-09-13 MED ORDER — INSULIN ASPART 100 UNIT/ML ~~LOC~~ SOLN
0.0000 [IU] | Freq: Three times a day (TID) | SUBCUTANEOUS | Status: DC
  Administered 2017-09-13: 3 [IU] via SUBCUTANEOUS
  Administered 2017-09-13: 4 [IU] via SUBCUTANEOUS
  Administered 2017-09-14: 7 [IU] via SUBCUTANEOUS
  Administered 2017-09-14: 3 [IU] via SUBCUTANEOUS
  Administered 2017-09-14 – 2017-09-15 (×4): 4 [IU] via SUBCUTANEOUS
  Administered 2017-09-16: 3 [IU] via SUBCUTANEOUS
  Administered 2017-09-16: 4 [IU] via SUBCUTANEOUS
  Administered 2017-09-16: 3 [IU] via SUBCUTANEOUS
  Administered 2017-09-17: 4 [IU] via SUBCUTANEOUS

## 2017-09-13 MED ORDER — MIDAZOLAM HCL 2 MG/2ML IJ SOLN
INTRAMUSCULAR | Status: AC
  Filled 2017-09-13: qty 2

## 2017-09-13 MED ORDER — HEPARIN SODIUM (PORCINE) 5000 UNIT/ML IJ SOLN
INTRAMUSCULAR | Status: DC | PRN
  Administered 2017-09-13: 500 mL

## 2017-09-13 MED ORDER — LIDOCAINE 2% (20 MG/ML) 5 ML SYRINGE
INTRAMUSCULAR | Status: AC
Start: 2017-09-13 — End: ?
  Filled 2017-09-13: qty 5

## 2017-09-13 MED ORDER — PROPOFOL 10 MG/ML IV BOLUS
INTRAVENOUS | Status: AC
  Filled 2017-09-13: qty 20

## 2017-09-13 SURGICAL SUPPLY — 58 items
BAG BANDED W/RUBBER/TAPE 36X54 (MISCELLANEOUS) ×2 IMPLANT
BAG SNAP BAND KOVER 36X36 (MISCELLANEOUS) ×2 IMPLANT
BANDAGE ACE 4X5 VEL STRL LF (GAUZE/BANDAGES/DRESSINGS) IMPLANT
BANDAGE ACE 6X5 VEL STRL LF (GAUZE/BANDAGES/DRESSINGS) IMPLANT
BLADE SURG 11 STRL SS (BLADE) ×2 IMPLANT
CANISTER SUCT 3000ML PPV (MISCELLANEOUS) ×2 IMPLANT
CATH ANGIO 5F BER2 65CM (CATHETERS) IMPLANT
CATH OMNI FLUSH .035X70CM (CATHETERS) IMPLANT
CATH OMNI FLUSH 5F 65CM (CATHETERS) ×2 IMPLANT
CATH VISIONS PV .035 IVUS (CATHETERS) ×2 IMPLANT
COVER BACK TABLE 60X90IN (DRAPES) ×2 IMPLANT
COVER DOME SNAP 22 D (MISCELLANEOUS) ×2 IMPLANT
COVER MAYO STAND STRL (DRAPES) ×2 IMPLANT
COVER PROBE W GEL 5X96 (DRAPES) IMPLANT
COVER SURGICAL LIGHT HANDLE (MISCELLANEOUS) ×2 IMPLANT
DERMABOND ADVANCED (GAUZE/BANDAGES/DRESSINGS) ×1
DERMABOND ADVANCED .7 DNX12 (GAUZE/BANDAGES/DRESSINGS) ×1 IMPLANT
DRAPE ORTHO SPLIT 77X108 STRL (DRAPES)
DRAPE SURG ORHT 6 SPLT 77X108 (DRAPES) IMPLANT
DRAPE UNIVERSAL PACK (DRAPES) IMPLANT
DRSG TEGADERM 4X4.75 (GAUZE/BANDAGES/DRESSINGS) ×2 IMPLANT
ELECT REM PT RETURN 9FT ADLT (ELECTROSURGICAL)
ELECTRODE REM PT RTRN 9FT ADLT (ELECTROSURGICAL) IMPLANT
GAUZE SPONGE 2X2 8PLY STRL LF (GAUZE/BANDAGES/DRESSINGS) ×1 IMPLANT
GAUZE SPONGE 4X4 16PLY XRAY LF (GAUZE/BANDAGES/DRESSINGS) ×2 IMPLANT
GLOVE BIO SURGEON STRL SZ 6.5 (GLOVE) ×2 IMPLANT
GLOVE BIOGEL PI IND STRL 7.0 (GLOVE) ×2 IMPLANT
GLOVE BIOGEL PI INDICATOR 7.0 (GLOVE) ×2
GOWN STRL REUS W/ TWL LRG LVL3 (GOWN DISPOSABLE) ×1 IMPLANT
GOWN STRL REUS W/ TWL XL LVL3 (GOWN DISPOSABLE) ×1 IMPLANT
GOWN STRL REUS W/TWL LRG LVL3 (GOWN DISPOSABLE) ×1
GOWN STRL REUS W/TWL XL LVL3 (GOWN DISPOSABLE) ×1
KIT BASIN OR (CUSTOM PROCEDURE TRAY) ×2 IMPLANT
KIT TURNOVER KIT B (KITS) ×2 IMPLANT
NEEDLE HYPO 25GX1X1/2 BEV (NEEDLE) IMPLANT
NEEDLE PERC 18GX7CM (NEEDLE) ×2 IMPLANT
NS IRRIG 1000ML POUR BTL (IV SOLUTION) IMPLANT
PAD ARMBOARD 7.5X6 YLW CONV (MISCELLANEOUS) ×4 IMPLANT
PROTECTION STATION PRESSURIZED (MISCELLANEOUS) ×2
SET MICROPUNCTURE 5F STIFF (MISCELLANEOUS) ×2 IMPLANT
SET ZELANTE DVT THROMB (CATHETERS) IMPLANT
SHEATH AVANTI 11CM 5FR (SHEATH) ×2 IMPLANT
SHEATH PINNACLE 8F 10CM (SHEATH) ×2 IMPLANT
SPONGE GAUZE 2X2 STER 10/PKG (GAUZE/BANDAGES/DRESSINGS) ×1
STATION PROTECTION PRESSURIZED (MISCELLANEOUS) ×1 IMPLANT
STOPCOCK MORSE 400PSI 3WAY (MISCELLANEOUS) ×2 IMPLANT
SUT VIC AB 4-0 PS2 27 (SUTURE) IMPLANT
SYR 10ML LL (SYRINGE) ×6 IMPLANT
SYR 20CC LL (SYRINGE) IMPLANT
SYR 30ML LL (SYRINGE) IMPLANT
SYR CONTROL 10ML LL (SYRINGE) ×2 IMPLANT
SYRINGE 20CC LL (MISCELLANEOUS) ×4 IMPLANT
TAPE CLOTH SURG 4X10 WHT LF (GAUZE/BANDAGES/DRESSINGS) ×2 IMPLANT
TOWEL GREEN STERILE (TOWEL DISPOSABLE) ×2 IMPLANT
TOWEL GREEN STERILE FF (TOWEL DISPOSABLE) ×2 IMPLANT
TUBING HIGH PRESSURE 120CM (CONNECTOR) ×2 IMPLANT
WIRE BENTSON .035X145CM (WIRE) ×2 IMPLANT
WIRE HI TORQ VERSACORE J 260CM (WIRE) IMPLANT

## 2017-09-13 NOTE — OR Nursing (Signed)
STACEY PERKINS RN HELD PRESSURE AFTER PULLING SHEATH FOR 20 MINUTES

## 2017-09-13 NOTE — Progress Notes (Signed)
ANTICOAGULATION CONSULT NOTE - Follow-Up Consult  Pharmacy Consult for Heparin Management Indication: DVT  Allergies  Allergen Reactions  . Penicillins Hives and Rash    Has patient had a PCN reaction causing immediate rash, facial/tongue/throat swelling, SOB or lightheadedness with hypotension: Yes Has patient had a PCN reaction causing severe rash involving mucus membranes or skin necrosis: No Has patient had a PCN reaction that required hospitalization No Has patient had a PCN reaction occurring within the last 10 years: No If all of the above answers are "NO", then may proceed with Cephalosporin use.     Patient Measurements: Height: 5\' 4"  (162.6 cm) Weight: 260 lb (117.9 kg) IBW/kg (Calculated) : 54.7 HEPARIN DW (KG): 83.2   Vital Signs: Temp: 97.7 F (36.5 C) (08/03 0930) Temp Source: Oral (08/03 0417) BP: 139/75 (08/03 0937) Pulse Rate: 46 (08/03 0937)  Labs: Recent Labs    09/12/17 1000  09/12/17 1922 09/13/17 0134 09/13/17 0348  HGB 15.0   < > 13.0 12.4 12.3  HCT 44.0   < > 40.4 38.8 38.5  PLT  --    < > 270 245 239  HEPARINUNFRC  --   --  <0.10*  --  <0.10*  CREATININE 0.60  --   --   --  0.67   < > = values in this interval not displayed.    Estimated Creatinine Clearance: 92.1 mL/min (by C-G formula based on SCr of 0.67 mg/dL).   Medical History: Past Medical History:  Diagnosis Date  . Arthritis   . Asthma   . Cancer [facility])    multiple skin cancers  . Diabetes mellitus without complication ([place])   . Diverticulosis   . Fluid retention   . Heart murmur   . High cholesterol   . Hypertension   . Incomplete rotator cuff tear   . PONV (postoperative nausea and vomiting)   . Wears glasses   . Wears partial dentures    bottom    Medications:  Scheduled:  . albuterol  3 mL Inhalation Q4H  . amLODipine  5 mg Oral Daily  . aspirin EC  81 mg Oral Daily  . simvastatin  20 mg Oral QHS  . sodium chloride flush  3 mL Intravenous Q12H  .  spironolactone  25 mg Oral Daily  . torsemide  60 mg Oral Daily   Infusions:  . sodium chloride Stopped (09/13/17 0914)  . sodium chloride Stopped (09/12/17 2128)  . heparin Stopped (09/13/17 0743)    Assessment: Amy Tran is a 57 yoF who presented for thrombolysis of  LLE DVT. Patient started on heparin 800 units/hr in the OR at ~1130. Patient also on alteplase 25 mL/hr.   8/3 AM update: heparin level remains undetectable, alteplase was stopped in OR this am. Orders received to resume heparin post-op now that back in ICU room. Heparin rate was adjusted this am prior to OR, now that off tPA will increase heparin rate to achieve more defined goal.  Goal of Therapy:  Heparin level 0.3-0.5 units/ml Monitor platelets by anticoagulation protocol: Yes   Plan:  -Increase heparin to 1400 units/hr -[place] PharmD., BCPS Clinical Pharmacist 09/13/2017 10:17 AM

## 2017-09-13 NOTE — OR Nursing (Signed)
8 French Venous Sheath Removed from Left Groin @0951  by Elenor Legato RN.  Left Groin site soft and unremarkable prior to sheath removal and pedal pulse present via doppler.  Pressure held for 20 minutes.  Pressure dressing applied to left groin; site and surrounding tissue soft and unremarkable.  Patient instructed to keep left leg straight, Head of Bed no greater than 30 degrees, hold pressure if need to cough, sneeze etc. and to remain on bedrest until 1300.  Patient verbalized understanding.

## 2017-09-13 NOTE — Anesthesia Postprocedure Evaluation (Signed)
Anesthesia Post Note  Patient: Amy Tran  Procedure(s) Performed: REMOVAL OF LYSIS CATHETER AND INTRAVASCULAR ULTRASOUND (IVUS) LEFT ILIAC VEIN (Left )     Patient location during evaluation: PACU Anesthesia Type: MAC Level of consciousness: awake Pain management: pain level controlled Vital Signs Assessment: post-procedure vital signs reviewed and stable Respiratory status: spontaneous breathing Cardiovascular status: stable Postop Assessment: no headache Anesthetic complications: no    Last Vitals:  Vitals:   09/13/17 1015 09/13/17 1030  BP: 134/79 139/71  Pulse: (!) 47 (!) 45  Resp: 15 (!) 21  Temp:    SpO2: 98% 96%    Last Pain:  Vitals:   09/13/17 0937  TempSrc:   PainSc: 0-No pain                 Sissi Padia

## 2017-09-13 NOTE — Interval H&P Note (Signed)
History and Physical Interval Note:  09/13/2017 7:38 AM  Amy Tran  has presented today for surgery, with the diagnosis of left leg clot  The various methods of treatment have been discussed with the patient and family. After consideration of risks, benefits and other options for treatment, the patient has consented to  Procedure(s): PERCUTANEOUS VENOUS THROMBECTOMY AND LYSIS WITH INTRAVASCULAR ULTRASOUND (IVUS) POSSIBLE ILIAC STENTING (Left) as a surgical intervention .  The patient's history has been reviewed, patient examined, no change in status, stable for surgery.  I have reviewed the patient's chart and labs.  Questions were answered to the patient's satisfaction.     [name]

## 2017-09-13 NOTE — Transfer of Care (Signed)
Immediate Anesthesia Transfer of Care Note  Patient: Amy Tran  Procedure(s) Performed: REMOVAL OF LYSIS CATHETER AND INTRAVASCULAR ULTRASOUND (IVUS) LEFT ILIAC VEIN (Left )  Patient Location: PACU  Anesthesia Type:MAC  Level of Consciousness: awake and patient cooperative  Airway & Oxygen Therapy: Patient Spontanous Breathing  Post-op Assessment: Report given to RN and Post -op Vital signs reviewed and stable  Post vital signs: Reviewed and stable  Last Vitals:  Vitals Value Taken Time  BP 147/72 09/13/2017  9:21 AM  Temp    Pulse 49 09/13/2017  9:22 AM  Resp 14 09/13/2017  9:22 AM  SpO2 95 % 09/13/2017  9:22 AM  Vitals shown include unvalidated device data.  Last Pain:  Vitals:   09/13/17 0417  TempSrc: Oral  PainSc:          Complications: No apparent anesthesia complications

## 2017-09-13 NOTE — Progress Notes (Signed)
ANTICOAGULATION CONSULT NOTE - Follow-Up Consult  Pharmacy Consult for Heparin Management Indication: DVT  Allergies  Allergen Reactions  . Penicillins Hives and Rash    Has patient had a PCN reaction causing immediate rash, facial/tongue/throat swelling, SOB or lightheadedness with hypotension: Yes Has patient had a PCN reaction causing severe rash involving mucus membranes or skin necrosis: No Has patient had a PCN reaction that required hospitalization No Has patient had a PCN reaction occurring within the last 10 years: No If all of the above answers are "NO", then may proceed with Cephalosporin use.     Patient Measurements: Height: 5\' 4"  (162.6 cm) Weight: 260 lb (117.9 kg) IBW/kg (Calculated) : 54.7 HEPARIN DW (KG): 83.2   Vital Signs: Temp: 98.4 F (36.9 C) (08/03 1635) Temp Source: Oral (08/03 1635) BP: 118/86 (08/03 1700) Pulse Rate: 61 (08/03 1300)  Labs: Recent Labs    09/12/17 1000  09/12/17 1922 09/13/17 0134 09/13/17 0348 09/13/17 1612  HGB 15.0   < > 13.0 12.4 12.3  --   HCT 44.0   < > 40.4 38.8 38.5  --   PLT  --    < > 270 245 239  --   HEPARINUNFRC  --   --  <0.10*  --  <0.10* <0.10*  CREATININE 0.60  --   --   --  0.67  --    < > = values in this interval not displayed.    Estimated Creatinine Clearance: 92.1 mL/min (by C-G formula based on SCr of 0.67 mg/dL).   Medical History: Past Medical History:  Diagnosis Date  . Arthritis   . Asthma   . Cancer [facility])    multiple skin cancers  . Diabetes mellitus without complication ([place])   . Diverticulosis   . Fluid retention   . Heart murmur   . High cholesterol   . Hypertension   . Incomplete rotator cuff tear   . PONV (postoperative nausea and vomiting)   . Wears glasses   . Wears partial dentures    bottom    Medications:  Scheduled:  . amLODipine  5 mg Oral Daily  . aspirin EC  81 mg Oral Daily  . insulin aspart  0-20 Units Subcutaneous TID WC  . simvastatin  20 mg Oral QHS  .  sodium chloride flush  3 mL Intravenous Q12H  . spironolactone  25 mg Oral Daily  . torsemide  60 mg Oral Daily   Infusions:  . sodium chloride Stopped (09/13/17 0914)  . sodium chloride Stopped (09/12/17 2128)  . heparin 1,400 Units/hr (09/13/17 1700)    Assessment: Amy Tran is a 24 yoF who presented for thrombolysis of  LLE DVT. Patient started on heparin 800 units/hr in the OR at ~1130. Patient also on alteplase 25 mL/hr.   Heparin level still undetectable. Heparin running at 1400 units/hr per RN. No bleeding noted.   Goal of Therapy:  Heparin level 0.3-0.5 units/ml Monitor platelets by anticoagulation protocol: Yes   Plan:  -Increase heparin to 1700 units/hr HL in 6 hours Daily HL  Amy Tran, PharmD, [place] Pager: [phone] Please utilize Amion for appropriate phone number to reach the unit pharmacist ([place])   09/13/2017 6:13 PM

## 2017-09-13 NOTE — OR Nursing (Signed)
PRESSURE WAS HELD FOR TWENTY MINUTES AFTER PULLING SHEATH.  PT'S GROIN IS SOFT WITH OUT HEMATOMA.  PEDAL PULSE PRESENT.

## 2017-09-13 NOTE — Anesthesia Postprocedure Evaluation (Signed)
Anesthesia Post Note  Patient: Amy Tran  Procedure(s) Performed: REMOVAL OF LYSIS CATHETER AND INTRAVASCULAR ULTRASOUND (IVUS) LEFT ILIAC VEIN (Left )     Patient location during evaluation: PACU Anesthesia Type: MAC Level of consciousness: awake Pain management: pain level controlled Vital Signs Assessment: post-procedure vital signs reviewed and stable Respiratory status: spontaneous breathing Cardiovascular status: stable Postop Assessment: no headache Anesthetic complications: no    Last Vitals:  Vitals:   09/13/17 1015 09/13/17 1030  BP: 134/79 139/71  Pulse: (!) 47 (!) 45  Resp: 15 (!) 21  Temp:    SpO2: 98% 96%    Last Pain:  Vitals:   09/13/17 0937  TempSrc:   PainSc: 0-No pain                 Vincenzina Jagoda

## 2017-09-13 NOTE — Op Note (Signed)
Procedure: Intravascular ultrasound of inferior vena cava left common and external iliac vein  Preoperative diagnosis: Iliofemoral DVT  Postoperative diagnosis: Same  Anesthesia: None  Operative findings: Persistent thrombus distal external iliac vein and common femoral vein  Operative details: After obtaining informed consent, the patient was taken the operating.  The patient was placed in supine position operating table.  The obturator for the lysis catheter was removed and the catheter accessed with an 035 Bentson wire.  The lysis catheter was then removed.  A IVUS catheter was then advanced over the guidewire and live IVIS was recorded from the inferior vena cava all the way down to the common femoral vein.  The thrombus that have been present in the common iliac vein had completely resolved.  However, there is still persistent thrombus in the distal external iliac and common femoral vein.  This extended all the way down to the femoral head.  I did not feel there would be any further benefit from continuing thrombolysis.  At this point the IVIS catheter was removed as well as the guidewire and the sheath removed and hemostasis obtained with direct pressure.  Operative management: The patient will return to the Cath Lab or operating room on Monday for a prone position direct popliteal vein puncture in order to find a potential landing zone for a left iliac stent.  She may require further AngioJet of the popliteal femoral vein segment.  The patient's tenecteplase will be discontinued today.  She will be maintained on IV heparin in the meanwhile.  She will be on bedrest for 2 hours.  She will be able to ambulate this afternoon.  [name], MD [facility] [place] Office: [phone] Pager: [phone]

## 2017-09-13 NOTE — Progress Notes (Signed)
Per Dr. Oneida Alar do not stop heparin.

## 2017-09-13 NOTE — Progress Notes (Signed)
Per Dr. Oneida Alar heparin and TPA stopped

## 2017-09-13 NOTE — Progress Notes (Signed)
ANTICOAGULATION CONSULT NOTE - Follow-Up Consult  Pharmacy Consult for Heparin Management Indication: DVT  Allergies  Allergen Reactions  . Penicillins Hives and Rash    Has patient had a PCN reaction causing immediate rash, facial/tongue/throat swelling, SOB or lightheadedness with hypotension: Yes Has patient had a PCN reaction causing severe rash involving mucus membranes or skin necrosis: No Has patient had a PCN reaction that required hospitalization No Has patient had a PCN reaction occurring within the last 10 years: No If all of the above answers are "NO", then may proceed with Cephalosporin use.     Patient Measurements: Height: 5\' 4"  (162.6 cm) Weight: 260 lb (117.9 kg) IBW/kg (Calculated) : 54.7 HEPARIN DW (KG): 83.2   Vital Signs: Temp: 98.4 F (36.9 C) (08/03 0417) Temp Source: Oral (08/03 0417) BP: 123/69 (08/03 0400) Pulse Rate: 49 (08/03 0400)  Labs: Recent Labs    09/12/17 1000  09/12/17 1922 09/13/17 0134 09/13/17 0348  HGB 15.0   < > 13.0 12.4 12.3  HCT 44.0   < > 40.4 38.8 38.5  PLT  --    < > 270 245 239  HEPARINUNFRC  --   --  <0.10*  --  <0.10*  CREATININE 0.60  --   --   --  0.67   < > = values in this interval not displayed.    Estimated Creatinine Clearance: 92.1 mL/min (by C-G formula based on SCr of 0.67 mg/dL).   Medical History: Past Medical History:  Diagnosis Date  . Arthritis   . Asthma   . Cancer [facility])    multiple skin cancers  . Diabetes mellitus without complication ([place])   . Diverticulosis   . Fluid retention   . Heart murmur   . High cholesterol   . Hypertension   . Incomplete rotator cuff tear   . PONV (postoperative nausea and vomiting)   . Wears glasses   . Wears partial dentures    bottom    Medications:  Scheduled:  . albuterol  3 mL Inhalation Q4H  . amLODipine  5 mg Oral Daily  . aspirin EC  81 mg Oral Daily  . simvastatin  20 mg Oral QHS  . sodium chloride flush  3 mL Intravenous Q12H  . sodium  chloride flush  3 mL Intravenous Q12H  . spironolactone  25 mg Oral Daily  . torsemide  60 mg Oral Daily   Infusions:  . sodium chloride 125 mL/hr at 09/13/17 0400  . sodium chloride Stopped (09/12/17 2128)  . alteplase (LIMB ISCHEMIA) 10 mg in normal saline (0.02 mg/mL) infusion 0.5 mg/hr (09/13/17 0400)  . heparin 1,000 Units/hr (09/13/17 0300)    Assessment: Amy Tran is a 55 yoF who presented for thrombolysis of  LLE DVT. Patient started on heparin 800 units/hr in the OR at ~1130. Patient also on alteplase 25 mL/hr.   8/3 AM update: heparin level remains undetectable, alteplase still running, Hgb ok, no issues per RN.   Goal of Therapy:  Heparin level 0.2-0.5 units/ml Monitor platelets by anticoagulation protocol: Yes   Plan:  -Increase heparin to 1250 units/hr -1200 HL  Narda Bonds, PharmD, BCPS Clinical Pharmacist Phone: [phone]

## 2017-09-14 ENCOUNTER — Other Ambulatory Visit: Payer: Self-pay

## 2017-09-14 ENCOUNTER — Encounter (HOSPITAL_COMMUNITY): Payer: Self-pay | Admitting: Vascular Surgery

## 2017-09-14 LAB — BASIC METABOLIC PANEL
ANION GAP: 11 (ref 5–15)
BUN: 10 mg/dL (ref 8–23)
CALCIUM: 8.9 mg/dL (ref 8.9–10.3)
CO2: 28 mmol/L (ref 22–32)
Chloride: 101 mmol/L (ref 98–111)
Creatinine, Ser: 0.66 mg/dL (ref 0.44–1.00)
GFR calc non Af Amer: >60 mL/min (ref >60)
GLUCOSE: 162 mg/dL — AB (ref 70–99)
POTASSIUM: 3.1 mmol/L — AB (ref 3.5–5.1)
SODIUM: 140 mmol/L (ref 135–145)

## 2017-09-14 LAB — CBC WITH DIFFERENTIAL/PLATELET
ABS IMMATURE GRANULOCYTES: 0.1 10*3/uL (ref 0.0–0.1)
BASOS PCT: 0 %
Basophils Absolute: 0 10*3/uL (ref 0.0–0.1)
Eosinophils Absolute: 0.2 10*3/uL (ref 0.0–0.7)
Eosinophils Relative: 2 %
HCT: 38.8 % (ref 36.0–46.0)
Hemoglobin: 12.3 g/dL (ref 12.0–15.0)
Immature Granulocytes: 1 %
Lymphocytes Relative: 19 %
Lymphs Abs: 1.9 10*3/uL (ref 0.7–4.0)
MCH: 28.9 pg (ref 26.0–34.0)
MCHC: 31.7 g/dL (ref 30.0–36.0)
MCV: 91.3 fL (ref 78.0–100.0)
MONO ABS: 1.1 10*3/uL — AB (ref 0.1–1.0)
Monocytes Relative: 11 %
NEUTROS ABS: 6.5 10*3/uL (ref 1.7–7.7)
Neutrophils Relative %: 67 %
Platelets: 236 10*3/uL (ref 150–400)
RBC: 4.25 MIL/uL (ref 3.87–5.11)
RDW: 14.2 % (ref 11.5–15.5)
WBC: 9.9 10*3/uL (ref 4.0–10.5)

## 2017-09-14 LAB — GLUCOSE, CAPILLARY
GLUCOSE-CAPILLARY: 149 mg/dL — AB (ref 70–99)
Glucose-Capillary: 194 mg/dL — ABNORMAL HIGH (ref 70–99)
Glucose-Capillary: 201 mg/dL — ABNORMAL HIGH (ref 70–99)

## 2017-09-14 LAB — HEPARIN LEVEL (UNFRACTIONATED): HEPARIN UNFRACTIONATED: 0.37 [IU]/mL (ref 0.30–0.70)

## 2017-09-14 MED ORDER — POTASSIUM CHLORIDE CRYS ER 20 MEQ PO TBCR
40.0000 meq | EXTENDED_RELEASE_TABLET | Freq: Once | ORAL | Status: AC
  Administered 2017-09-14: 40 meq via ORAL
  Filled 2017-09-14: qty 2

## 2017-09-14 NOTE — Progress Notes (Addendum)
Informed written consent obtained from patient after verbalizing understanding of procedure to be performed tomorrow by Dr. Oneida Alar who answered all questions to patient's satisfaction.  Paper copy on chart.

## 2017-09-14 NOTE — Progress Notes (Signed)
[place] for Heparin  Indication: DVT  Allergies  Allergen Reactions  . Penicillins Hives and Rash    Has patient had a PCN reaction causing immediate rash, facial/tongue/throat swelling, SOB or lightheadedness with hypotension: Yes Has patient had a PCN reaction causing severe rash involving mucus membranes or skin necrosis: No Has patient had a PCN reaction that required hospitalization No Has patient had a PCN reaction occurring within the last 10 years: No If all of the above answers are "NO", then may proceed with Cephalosporin use.     Patient Measurements: Height: 5\' 4"  (162.6 cm) Weight: 260 lb (117.9 kg) IBW/kg (Calculated) : 54.7 HEPARIN DW (KG): 83.2   Vital Signs: Temp: 98.4 F (36.9 C) (08/04 0019) Temp Source: Oral (08/04 0019) BP: 107/60 (08/04 0200) Pulse Rate: 40 (08/04 0200)  Labs: Recent Labs    09/12/17 1000  09/12/17 1922 09/13/17 0134 09/13/17 0348 09/13/17 1612 09/14/17 0110  HGB 15.0   < > 13.0 12.4 12.3  --   --   HCT 44.0   < > 40.4 38.8 38.5  --   --   PLT  --    < > 270 245 239  --   --   HEPARINUNFRC  --    < > <0.10*  --  <0.10* <0.10* <0.10*  CREATININE 0.60  --   --   --  0.67  --   --    < > = values in this interval not displayed.    Estimated Creatinine Clearance: 92.1 mL/min (by C-G formula based on SCr of 0.67 mg/dL).  Assessment: 62 yo female s/p thrombolysis of  LLE DVT 8/3, for heparin  Goal of Therapy:  Heparin level 0.3-0.5 units/ml Monitor platelets by anticoagulation protocol: Yes   Plan:  Increase Heparin 2050 units/hr Check heparin level in 8 hours.   Phillis Knack, PharmD, BCPS  09/14/2017 2:25 AM

## 2017-09-14 NOTE — Progress Notes (Signed)
ANTICOAGULATION CONSULT NOTE - [place] for Heparin  Indication: DVT  Allergies  Allergen Reactions  . Penicillins Hives and Rash    Has patient had a PCN reaction causing immediate rash, facial/tongue/throat swelling, SOB or lightheadedness with hypotension: Yes Has patient had a PCN reaction causing severe rash involving mucus membranes or skin necrosis: No Has patient had a PCN reaction that required hospitalization No Has patient had a PCN reaction occurring within the last 10 years: No If all of the above answers are "NO", then may proceed with Cephalosporin use.     Patient Measurements: Height: 5\' 4"  (162.6 cm) Weight: 260 lb (117.9 kg) IBW/kg (Calculated) : 54.7 HEPARIN DW (KG): 83.2   Vital Signs: Temp: 98.1 F (36.7 C) (08/04 0737) Temp Source: Oral (08/04 0737) BP: 139/82 (08/04 1000) Pulse Rate: 50 (08/04 1000)  Labs: Recent Labs    09/12/17 1000  09/13/17 0134 09/13/17 0348 09/13/17 1612 09/14/17 0110 09/14/17 0625 09/14/17 1013  HGB 15.0   < > 12.4 12.3  --   --  12.3  --   HCT 44.0   < > 38.8 38.5  --   --  38.8  --   PLT  --    < > 245 239  --   --  236  --   HEPARINUNFRC  --    < >  --  <0.10* <0.10* <0.10*  --  0.37  CREATININE 0.60  --   --  0.67  --   --  0.66  --    < > = values in this interval not displayed.    Estimated Creatinine Clearance: 92.1 mL/min (by C-G formula based on SCr of 0.66 mg/dL).   Medical History: Past Medical History:  Diagnosis Date  . Arthritis   . Asthma   . Cancer [facility])    multiple skin cancers  . Diabetes mellitus without complication ([place])   . Diverticulosis   . Fluid retention   . Heart murmur   . High cholesterol   . Hypertension   . Incomplete rotator cuff tear   . PONV (postoperative nausea and vomiting)   . Wears glasses   . Wears partial dentures    bottom    Medications:  Scheduled:  . amLODipine  5 mg Oral Daily  . aspirin EC  81 mg Oral Daily  . insulin  aspart  0-20 Units Subcutaneous TID WC  . simvastatin  20 mg Oral QHS  . sodium chloride flush  3 mL Intravenous Q12H  . spironolactone  25 mg Oral Daily  . torsemide  60 mg Oral Daily   Infusions:  . sodium chloride Stopped (09/13/17 0914)  . sodium chloride Stopped (09/12/17 2128)  . heparin 2,050 Units/hr (09/14/17 1000)    Assessment: Amy Tran is a 64 yoF who presented for thrombolysis of  LLE DVT. Patient started on heparin 800 units/hr in the OR at ~1130. Patient also on alteplase 25 mL/hr.   Heparin level now at low end of goal after rate increased to 2050 units/hr overnight. No bleeding noted overnight, plans to return to OR tomorrow. Will make slight rate increase to keep her within range.    Goal of Therapy:  Heparin level 0.3-0.7 units/ml Monitor platelets by anticoagulation protocol: Yes   Plan:  -Increase heparin to 2150 units/hr -Daily HL  Erin Hearing PharmD., BCPS Clinical Pharmacist 09/14/2017 11:29 AM

## 2017-09-14 NOTE — Plan of Care (Signed)
  Problem: Clinical Measurements: Goal: Respiratory complications will improve Outcome: Progressing   Problem: Activity: Goal: Risk for activity intolerance will decrease Outcome: Progressing   Problem: Nutrition: Goal: Adequate nutrition will be maintained Outcome: Progressing   Problem: Coping: Goal: Level of anxiety will decrease Outcome: Progressing   Problem: Elimination: Goal: Will not experience complications related to bowel motility Outcome: Progressing   Problem: Pain Managment: Goal: General experience of comfort will improve Outcome: Progressing   Problem: Safety: Goal: Ability to remain free from injury will improve Outcome: Progressing

## 2017-09-14 NOTE — Progress Notes (Signed)
[facility] [place]  Subjective  - feels ok   Objective 139/82 (!) 50 98.1 F (36.7 C) (Oral) 15 96%  Intake/Output Summary (Last 24 hours) at 09/14/2017 1053 Last data filed at 09/14/2017 1000 Gross per 24 hour  Intake 2314.5 ml  Output 5435 ml  Net -3120.5 ml   Left leg swelling about the same  Assessment/Planning: Left iliofemoral DVT Plan to return to OR tomorrow for prone popliteal vein stick to potentially treat iliac vein NPO p midnight, consent Continue heparin  Transfer 4e  [name] 09/14/2017 10:53 AM --  Laboratory Lab Results: Recent Labs    09/13/17 0348 09/14/17 0625  WBC 9.3 9.9  HGB 12.3 12.3  HCT 38.5 38.8  PLT 239 236   BMET Recent Labs    09/13/17 0348 09/14/17 0625  NA 141 140  K 3.0* 3.1*  CL 105 101  CO2 30 28  GLUCOSE 154* 162*  BUN 8 10  CREATININE 0.67 0.66  CALCIUM 8.4* 8.9    COAG Lab Results  Component Value Date   INR 0.93 06/08/2017   No results found for: PTT

## 2017-09-15 ENCOUNTER — Encounter (HOSPITAL_COMMUNITY): Payer: Self-pay | Admitting: Certified Registered"

## 2017-09-15 ENCOUNTER — Encounter (HOSPITAL_COMMUNITY): Payer: Self-pay

## 2017-09-15 ENCOUNTER — Encounter (HOSPITAL_COMMUNITY)
Admission: RE | Disposition: A | Discharge status: Home / Self Care | Payer: Self-pay | Source: Home / Self Care | Attending: Vascular Surgery

## 2017-09-15 HISTORY — PX: PERIPHERAL VASCULAR INTERVENTION: CATH118257

## 2017-09-15 HISTORY — PX: PERIPHERAL VASCULAR THROMBECTOMY: CATH118306

## 2017-09-15 LAB — CBC WITH DIFFERENTIAL/PLATELET
Abs Immature Granulocytes: 0 10*3/uL (ref 0.0–0.1)
BASOS ABS: 0 10*3/uL (ref 0.0–0.1)
Basophils Relative: 0 %
EOS PCT: 2 %
Eosinophils Absolute: 0.2 10*3/uL (ref 0.0–0.7)
HCT: 41.1 % (ref 36.0–46.0)
HEMOGLOBIN: 13.3 g/dL (ref 12.0–15.0)
IMMATURE GRANULOCYTES: 0 %
Lymphocytes Relative: 25 %
Lymphs Abs: 2.8 10*3/uL (ref 0.7–4.0)
MCH: 29.3 pg (ref 26.0–34.0)
MCHC: 32.4 g/dL (ref 30.0–36.0)
MCV: 90.5 fL (ref 78.0–100.0)
Monocytes Absolute: 1.2 10*3/uL — ABNORMAL HIGH (ref 0.1–1.0)
Monocytes Relative: 11 %
NEUTROS PCT: 62 %
Neutro Abs: 6.8 10*3/uL (ref 1.7–7.7)
Platelets: 238 10*3/uL (ref 150–400)
RBC: 4.54 MIL/uL (ref 3.87–5.11)
RDW: 13.9 % (ref 11.5–15.5)
WBC: 11.1 10*3/uL — AB (ref 4.0–10.5)

## 2017-09-15 LAB — GLUCOSE, CAPILLARY
GLUCOSE-CAPILLARY: 158 mg/dL — AB (ref 70–99)
GLUCOSE-CAPILLARY: 159 mg/dL — AB (ref 70–99)
Glucose-Capillary: 188 mg/dL — ABNORMAL HIGH (ref 70–99)
Glucose-Capillary: 174 mg/dL — ABNORMAL HIGH (ref 70–99)
Glucose-Capillary: 166 mg/dL — ABNORMAL HIGH (ref 70–99)

## 2017-09-15 LAB — HEPARIN LEVEL (UNFRACTIONATED): HEPARIN UNFRACTIONATED: 0.5 [IU]/mL (ref 0.30–0.70)

## 2017-09-15 SURGERY — INSERTION, STENT, ARTERY, ILIAC
Anesthesia: Monitor Anesthesia Care | Laterality: Left

## 2017-09-15 SURGERY — PERIPHERAL VASCULAR THROMBECTOMY
Anesthesia: LOCAL | Laterality: Left

## 2017-09-15 MED ORDER — MIDAZOLAM HCL 2 MG/2ML IJ SOLN
INTRAMUSCULAR | Status: AC
  Filled 2017-09-15: qty 2

## 2017-09-15 MED ORDER — SODIUM CHLORIDE 0.9% FLUSH
3.0000 mL | Freq: Two times a day (BID) | INTRAVENOUS | Status: DC
  Administered 2017-09-15 – 2017-09-16 (×3): 3 mL via INTRAVENOUS

## 2017-09-15 MED ORDER — HEPARIN (PORCINE) IN NACL 100-0.45 UNIT/ML-% IJ SOLN
2000.0000 [IU]/h | INTRAMUSCULAR | Status: DC
  Administered 2017-09-15 (×2): 2150 [IU]/h via INTRAVENOUS
  Administered 2017-09-16: 2000 [IU]/h via INTRAVENOUS
  Filled 2017-09-15 (×2): qty 250

## 2017-09-15 MED ORDER — FENTANYL CITRATE (PF) 100 MCG/2ML IJ SOLN
INTRAMUSCULAR | Status: AC
  Filled 2017-09-15: qty 2

## 2017-09-15 MED ORDER — HEPARIN (PORCINE) IN NACL 1000-0.9 UT/500ML-% IV SOLN
INTRAVENOUS | Status: AC
  Filled 2017-09-15: qty 500

## 2017-09-15 MED ORDER — LIDOCAINE HCL (PF) 1 % IJ SOLN
INTRAMUSCULAR | Status: DC | PRN
  Administered 2017-09-15: 10 mL

## 2017-09-15 MED ORDER — SODIUM CHLORIDE 0.9 % IV SOLN
0.2500 mg/h | INTRAVENOUS | Status: DC
  Filled 2017-09-15: qty 10

## 2017-09-15 MED ORDER — LABETALOL HCL 5 MG/ML IV SOLN: 10.0000 mg | INTRAVENOUS | Status: DC | PRN

## 2017-09-15 MED ORDER — HYDRALAZINE HCL 20 MG/ML IJ SOLN: 5.0000 mg | INTRAMUSCULAR | Status: DC | PRN

## 2017-09-15 MED ORDER — LIDOCAINE HCL (PF) 1 % IJ SOLN
INTRAMUSCULAR | Status: AC
  Filled 2017-09-15: qty 30

## 2017-09-15 MED ORDER — IODIXANOL 320 MG/ML IV SOLN
INTRAVENOUS | Status: DC | PRN
  Administered 2017-09-15: 35 mL via INTRAVENOUS

## 2017-09-15 MED ORDER — ACETAMINOPHEN 325 MG PO TABS: 650.0000 mg | ORAL_TABLET | ORAL | Status: DC | PRN

## 2017-09-15 MED ORDER — HEPARIN SODIUM (PORCINE) 1000 UNIT/ML IJ SOLN
INTRAMUSCULAR | Status: DC | PRN
  Administered 2017-09-15 (×3): 5000 [IU] via INTRAVENOUS

## 2017-09-15 MED ORDER — SODIUM CHLORIDE 0.9% FLUSH: 3.0000 mL | INTRAVENOUS | Status: DC | PRN

## 2017-09-15 MED ORDER — SODIUM CHLORIDE 0.9 % IV SOLN: 250.0000 mL | INTRAVENOUS | Status: DC | PRN

## 2017-09-15 MED ORDER — SODIUM CHLORIDE 0.9 % IV SOLN
INTRAVENOUS | Status: AC
  Administered 2017-09-15: 18:00:00 via INTRAVENOUS

## 2017-09-15 MED ORDER — ASPIRIN EC 81 MG PO TBEC: 81.0000 mg | DELAYED_RELEASE_TABLET | Freq: Every day | ORAL | Status: DC

## 2017-09-15 MED ORDER — HEPARIN (PORCINE) IN NACL 1000-0.9 UT/500ML-% IV SOLN
INTRAVENOUS | Status: DC | PRN
  Administered 2017-09-15 (×2): 500 mL

## 2017-09-15 MED ORDER — MIDAZOLAM HCL 2 MG/2ML IJ SOLN
INTRAMUSCULAR | Status: DC | PRN
  Administered 2017-09-15 (×3): 1 mg via INTRAVENOUS

## 2017-09-15 MED ORDER — FENTANYL CITRATE (PF) 100 MCG/2ML IJ SOLN
INTRAMUSCULAR | Status: DC | PRN
  Administered 2017-09-15 (×3): 50 ug via INTRAVENOUS

## 2017-09-15 MED ORDER — ONDANSETRON HCL 4 MG/2ML IJ SOLN
4.0000 mg | Freq: Four times a day (QID) | INTRAMUSCULAR | Status: DC | PRN
  Administered 2017-09-15: 4 mg via INTRAVENOUS

## 2017-09-15 MED FILL — Heparin Sodium (Porcine) Inj 1000 Unit/ML: INTRAMUSCULAR | Qty: 10 | Status: AC

## 2017-09-15 SURGICAL SUPPLY — 23 items
BAG SNAP BAND KOVER 36X36 (MISCELLANEOUS) ×3 IMPLANT
BALLN ATLAS 14X40X75 (BALLOONS) ×3
BALLN MUSTANG 10X80X75 (BALLOONS) ×3
BALLN MUSTANG 12X80X75 (BALLOONS) ×3
BALLOON ATLAS 14X40X75 (BALLOONS) ×2 IMPLANT
BALLOON MUSTANG 10X80X75 (BALLOONS) ×2 IMPLANT
BALLOON MUSTANG 12X80X75 (BALLOONS) ×2 IMPLANT
CATH ANGIO 5F BER 100CM (CATHETERS) ×3 IMPLANT
CATH RETRIEVER CLOT 16MMX105CM (CATHETERS) ×3 IMPLANT
CATH THROMBEC 7F 135 CLEANER15 (MISCELLANEOUS) ×3 IMPLANT
CATH VISIONS PV .035 IVUS (CATHETERS) ×3 IMPLANT
COVER DOME SNAP 22 D (MISCELLANEOUS) ×3 IMPLANT
DEVICE TORQUE .025-.038 (MISCELLANEOUS) ×3 IMPLANT
GUIDEWIRE ANGLED .035X260CM (WIRE) ×3 IMPLANT
KIT ENCORE 26 ADVANTAGE (KITS) ×3 IMPLANT
KIT MICROPUNCTURE NIT STIFF (SHEATH) ×3 IMPLANT
PROTECTION STATION PRESSURIZED (MISCELLANEOUS) ×3
SHEATH CLOT RETRIEVER (SHEATH) ×3 IMPLANT
SHEATH PINNACLE 9F 10CM (SHEATH) ×3 IMPLANT
SHEATH PROBE COVER 6X72 (BAG) ×3 IMPLANT
STATION PROTECTION PRESSURIZED (MISCELLANEOUS) ×2 IMPLANT
STENT VICI VENOUS 14X120 (Permanent Stent) ×3 IMPLANT
WIRE AMPLATZ SS-J .035X260CM (WIRE) ×3 IMPLANT

## 2017-09-15 NOTE — Progress Notes (Signed)
ANTICOAGULATION CONSULT NOTE - [place] for Heparin  Indication: LLE DVT  Allergies  Allergen Reactions  . Penicillins Hives and Rash    Has patient had a PCN reaction causing immediate rash, facial/tongue/throat swelling, SOB or lightheadedness with hypotension: Yes Has patient had a PCN reaction causing severe rash involving mucus membranes or skin necrosis: No Has patient had a PCN reaction that required hospitalization No Has patient had a PCN reaction occurring within the last 10 years: No If all of the above answers are "NO", then may proceed with Cephalosporin use.     Patient Measurements: Height: 5\' 4"  (162.6 cm) Weight: 260 lb (117.9 kg) IBW/kg (Calculated) : 54.7 HEPARIN DW (KG): 83.2   Vital Signs: Temp: 97.8 F (36.6 C) (08/05 1244) Temp Source: Oral (08/05 1244) BP: 147/84 (08/05 1616) Pulse Rate: 43 (08/05 1658)  Labs: Recent Labs    09/13/17 0348  09/14/17 0110 09/14/17 0625 09/14/17 1013 09/15/17 0225  HGB 12.3  --   --  12.3  --  13.3  HCT 38.5  --   --  38.8  --  41.1  PLT 239  --   --  236  --  238  HEPARINUNFRC <0.10*   < > <0.10*  --  0.37 0.50  CREATININE 0.67  --   --  0.66  --   --    < > = values in this interval not displayed.    Estimated Creatinine Clearance: 92.1 mL/min (by C-G formula based on SCr of 0.66 mg/dL).   Medical History: Past Medical History:  Diagnosis Date  . Arthritis   . Asthma   . Cancer [facility])    multiple skin cancers  . Diabetes mellitus without complication ([place])   . Diverticulosis   . Fluid retention   . Heart murmur   . High cholesterol   . Hypertension   . Incomplete rotator cuff tear   . PONV (postoperative nausea and vomiting)   . Wears glasses   . Wears partial dentures    bottom    Medications:  Scheduled:  . amLODipine  5 mg Oral Daily  . aspirin EC  81 mg Oral Daily  . aspirin EC  81 mg Oral Daily  . insulin aspart  0-20 Units Subcutaneous TID WC  .  simvastatin  20 mg Oral QHS  . sodium chloride flush  3 mL Intravenous Q12H  . sodium chloride flush  3 mL Intravenous Q12H  . spironolactone  25 mg Oral Daily  . torsemide  60 mg Oral Daily   Infusions:  . sodium chloride Stopped (09/13/17 0914)  . sodium chloride 10 mL/hr at 09/15/17 1420  . sodium chloride    . sodium chloride    . heparin      Assessment: Ms. Paiz is a 62 yo F who presented for thrombolysis of LLE DVT. Patient initially on heparin and alteplase for thrombolysis.  Alteplase has been stopped and pt continues on heparin alone.  Pt is s/p OR for balloon angioplasty, mechanical thrombectomy, and stent placement.  To restart heparin 2 hours post sheath removal (4pm).    Pt was previously therapeutic at 0.5 on heparin 2150 units/hr.   Goal of Therapy:  Heparin level 0.3-0.7 units/ml Monitor platelets by anticoagulation protocol: Yes   Plan:  - At 6pm, restart heparin at 2150 units/hr - Next heparin level with AM labs.  [employer], Pharm.D., BCPS Clinical Pharmacist Clinical phone for 09/15/2017 is [phone].  **Pharmacist phone  directory can now be found on [email or web address] ([email or web address]).  Listed under [place].  09/15/2017 5:25 PM

## 2017-09-15 NOTE — Progress Notes (Addendum)
ANTICOAGULATION CONSULT NOTE - [place] for Heparin  Indication: DVT  Allergies  Allergen Reactions  . Penicillins Hives and Rash    Has patient had a PCN reaction causing immediate rash, facial/tongue/throat swelling, SOB or lightheadedness with hypotension: Yes Has patient had a PCN reaction causing severe rash involving mucus membranes or skin necrosis: No Has patient had a PCN reaction that required hospitalization No Has patient had a PCN reaction occurring within the last 10 years: No If all of the above answers are "NO", then may proceed with Cephalosporin use.     Patient Measurements: Height: 5\' 4"  (162.6 cm) Weight: 260 lb (117.9 kg) IBW/kg (Calculated) : 54.7 HEPARIN DW (KG): 83.2   Vital Signs: Temp: 98.2 F (36.8 C) (08/05 0415) Temp Source: Oral (08/05 0415) BP: 111/76 (08/05 0700) Pulse Rate: 54 (08/05 0700)  Labs: Recent Labs    09/12/17 1000  09/13/17 0348  09/14/17 0110 09/14/17 0625 09/14/17 1013 09/15/17 0225  HGB 15.0   < > 12.3  --   --  12.3  --  13.3  HCT 44.0   < > 38.5  --   --  38.8  --  41.1  PLT  --    < > 239  --   --  236  --  238  HEPARINUNFRC  --    < > <0.10*   < > <0.10*  --  0.37 0.50  CREATININE 0.60  --  0.67  --   --  0.66  --   --    < > = values in this interval not displayed.    Estimated Creatinine Clearance: 92.1 mL/min (by C-G formula based on SCr of 0.66 mg/dL).   Medical History: Past Medical History:  Diagnosis Date  . Arthritis   . Asthma   . Cancer [facility])    multiple skin cancers  . Diabetes mellitus without complication ([place])   . Diverticulosis   . Fluid retention   . Heart murmur   . High cholesterol   . Hypertension   . Incomplete rotator cuff tear   . PONV (postoperative nausea and vomiting)   . Wears glasses   . Wears partial dentures    bottom    Medications:  Scheduled:  . amLODipine  5 mg Oral Daily  . aspirin EC  81 mg Oral Daily  . insulin aspart  0-20 Units  Subcutaneous TID WC  . simvastatin  20 mg Oral QHS  . sodium chloride flush  3 mL Intravenous Q12H  . spironolactone  25 mg Oral Daily  . torsemide  60 mg Oral Daily   Infusions:  . sodium chloride Stopped (09/13/17 0914)  . sodium chloride Stopped (09/12/17 2128)  . heparin 2,150 Units/hr (09/15/17 0600)    Assessment: Ms. Gruenhagen is a 69 yoF who presented for thrombolysis of  LLE DVT. Patient started on heparin 800 units/hr in the OR at ~1130 on 8/2. Patient was on alteplase 25 mL/hr.   Heparin level therapeutic at 0.5 on heparin 2150 units/hr. H/H stable. No bleeding noted or issues reported by nursing. Plans to return to OR today.   Goal of Therapy:  Heparin level 0.3-0.7 units/ml Monitor platelets by anticoagulation protocol: Yes   Plan:  - Continue heparin to 2150 units/hr - Daily HL  Claiborne Billings, PharmD PGY2 Cardiology Pharmacy Resident Phone [phone] 09/15/2017 8:39 AM

## 2017-09-15 NOTE — Op Note (Signed)
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 Donzetta Matters, MD Procedure Performed: 1.  Ultrasound-guided cannulation of left small saphenous vein 2.  IVIS of left popliteal, left femoral, left common femoral, left external iliac, left common iliac veins and IVC 3.  Balloon angioplasty of the left common femoral vein with 10 and 12 mm balloons 4.  Stent of left common and external iliac veins with 14 x 120 mm Vici 5.  Mechanical thrombectomy with Inari clottriever and cleaner wire 6.  Left lower extremity venogram 7.  Moderate sedation with fentanyl Versed 107 minutes   Indications: 62 year old female with extensive left lower extremity DVT recently underwent lysis and AngioJet pharmacomechanical thrombectomy.  At that time she cannot undergo stenting given that if she was from a femoral approach.  She is now indicated for prone popliteal approach.  Findings: Popliteal vein was free of disease was cannulated at the small saphenous vein junction.  Femoral vein was free of disease up to the level of the common femoral disease where there was chronic appearing thrombus.  The external iliac  vein was freed to the level of the confluence of common iliac vein where there appeared to be tight stenosis.  There was very tight stenosis with nearly 0  lumen at the level of the confluence of the IVC.  At completion of stenting there was a 14-1/2 x 10-1/2 mm lumen where previously it was occluded.  The stent was patent without any narrowing.  There remains residual chronic thrombus in the left common femoral vein but there is flow via venography.   Were she did continue to have edema in the left lower extremity she could be considered for open common femoral vein angioplasty and patch on the left.   Procedure:  The patient was  identified in the holding area and taken to room 8.  The patient was then placed prone on the table timeout was called moderate sedation was induced.  We used ultrasound to identify the popliteal vein which was noted to be patent although quite deep and the small saphenous vein was patent feeding into the popliteal at the level of the popliteal fossae.  This area was anesthetized with 1% lidocaine and cannulated the small saphenous vein junction with the popliteal vein with a micropuncture needle followed by wire and sheath.  I then placed an 8 French sheath and an additional 5000 units of heparin was given.  I did reduce this heparin twice throughout the case.  I placed a Glidewire and bare catheter into the SVC and exchanged for a stiff Amplatz wire.  I performed IVUS from the popliteal all the way through the IVC.  With this I elected to use the clotriever.  I exchanged for the sheath..  I then performed several runs up to a total of 5 until there was clear.  Intravascular ultrasound again demonstrated residual clot at the common femoral vein and so I used a cleaner wire there.  I did get some lumen advanced.  I then used a 10 mm balloon to predilate both the common femoral vein in the level of the occluded vein at the common iliac vein.  I then performed venography to demonstrate that there truly was a lumen in the common femoral vein so I would have inflow if I were to stent.  After I demonstrated this  I elected to deploy a 14 x 120 stent that was measured from intravascular ultrasound.  After this was deployed was postdilated with first a 12 followed by 14 mm balloon.  I also ballooned up to the common femoral vein with a 12 mm balloon.  Completion IVIS demonstrated a lumen in the common femoral vein although there remains some chronic thrombus there.  The stent was patent with large lumen.  Venography demonstrated the same.  Satisfied the wire was removed the sheath was pulled and pressure held for 10 minutes  until hemostasis obtained.  We will resume anticoagulation in 2 hours.   Contrast: 35 cc.  Jahking Lesser C. Donzetta Matters, MD [facility] [place] Office: [phone] Pager: [phone]

## 2017-09-15 NOTE — Progress Notes (Signed)
  Progress Note    09/15/2017 1:42 PM 2 Days Post-Op  Subjective:  Feels good, wants to get out of bed  Vitals:   09/15/17 1244 09/15/17 1300  BP:  (!) 150/83  Pulse:  (!) 49  Resp:    Temp: 97.8 F (36.6 C)   SpO2:  92%    Physical Exam: aaox3 Non labored respirations Left leg edema is non pitting, 30% larger than right  CBC    Component Value Date/Time   WBC 11.1 (H) 09/15/2017 0225   RBC 4.54 09/15/2017 0225   HGB 13.3 09/15/2017 0225   HCT 41.1 09/15/2017 0225   PLT 238 09/15/2017 0225   MCV 90.5 09/15/2017 0225   MCH 29.3 09/15/2017 0225   MCHC 32.4 09/15/2017 0225   RDW 13.9 09/15/2017 0225   LYMPHSABS 2.8 09/15/2017 0225   MONOABS 1.2 (H) 09/15/2017 0225   EOSABS 0.2 09/15/2017 0225   BASOSABS 0.0 09/15/2017 0225    BMET    Component Value Date/Time   NA 140 09/14/2017 0625   K 3.1 (L) 09/14/2017 0625   CL 101 09/14/2017 0625   CO2 28 09/14/2017 0625   GLUCOSE 162 (H) 09/14/2017 0625   BUN 10 09/14/2017 0625   CREATININE 0.66 09/14/2017 0625   CALCIUM 8.9 09/14/2017 0625   GFRNONAA >60 09/14/2017 0625   GFRAA >60 09/14/2017 0625    INR    Component Value Date/Time   INR 0.93 06/08/2017 1728     Intake/Output Summary (Last 24 hours) at 09/15/2017 1342 Last data filed at 09/15/2017 1300 Gross per 24 hour  Intake 1075.53 ml  Output 4415 ml  Net -3339.47 ml     Assessment:  62 y.o. female is here with extensive left lower extremity dvt  Plan: pv lab for popliteal approach to venous thrombectomy and possible stenting.  Brandon C. Donzetta Matters, MD [facility] [place] Office: [phone] Pager: [phone]  09/15/2017 1:42 PM

## 2017-09-16 ENCOUNTER — Encounter (HOSPITAL_COMMUNITY): Payer: Self-pay | Admitting: Vascular Surgery

## 2017-09-16 LAB — GLUCOSE, CAPILLARY
GLUCOSE-CAPILLARY: 137 mg/dL — AB (ref 70–99)
Glucose-Capillary: 131 mg/dL — ABNORMAL HIGH (ref 70–99)
Glucose-Capillary: 173 mg/dL — ABNORMAL HIGH (ref 70–99)
Glucose-Capillary: 152 mg/dL — ABNORMAL HIGH (ref 70–99)

## 2017-09-16 LAB — BASIC METABOLIC PANEL
Anion gap: 14 (ref 5–15)
BUN: 12 mg/dL (ref 8–23)
CALCIUM: 9.5 mg/dL (ref 8.9–10.3)
CO2: 31 mmol/L (ref 22–32)
CREATININE: 0.72 mg/dL (ref 0.44–1.00)
Chloride: 95 mmol/L — ABNORMAL LOW (ref 98–111)
GFR calc Af Amer: >60 mL/min (ref >60)
GLUCOSE: 143 mg/dL — AB (ref 70–99)
Potassium: 2.9 mmol/L — ABNORMAL LOW (ref 3.5–5.1)
Sodium: 140 mmol/L (ref 135–145)

## 2017-09-16 LAB — CBC WITH DIFFERENTIAL/PLATELET
Abs Immature Granulocytes: 0 10*3/uL (ref 0.0–0.1)
Basophils Absolute: 0.1 10*3/uL (ref 0.0–0.1)
Basophils Relative: 0 %
EOS ABS: 0.1 10*3/uL (ref 0.0–0.7)
Eosinophils Relative: 1 %
HEMATOCRIT: 42.9 % (ref 36.0–46.0)
Hemoglobin: 13.7 g/dL (ref 12.0–15.0)
IMMATURE GRANULOCYTES: 0 %
LYMPHS ABS: 2 10*3/uL (ref 0.7–4.0)
Lymphocytes Relative: 17 %
MCH: 28.8 pg (ref 26.0–34.0)
MCHC: 31.9 g/dL (ref 30.0–36.0)
MCV: 90.1 fL (ref 78.0–100.0)
MONOS PCT: 9 %
Monocytes Absolute: 1 10*3/uL (ref 0.1–1.0)
NEUTROS PCT: 73 %
Neutro Abs: 8.5 10*3/uL — ABNORMAL HIGH (ref 1.7–7.7)
PLATELETS: 256 10*3/uL (ref 150–400)
RBC: 4.76 MIL/uL (ref 3.87–5.11)
RDW: 13.8 % (ref 11.5–15.5)
WBC: 11.7 10*3/uL — ABNORMAL HIGH (ref 4.0–10.5)

## 2017-09-16 LAB — CBC
HCT: 41.6 % (ref 36.0–46.0)
Hemoglobin: 13.7 g/dL (ref 12.0–15.0)
MCH: 29.6 pg (ref 26.0–34.0)
MCHC: 32.9 g/dL (ref 30.0–36.0)
MCV: 89.8 fL (ref 78.0–100.0)
PLATELETS: 247 10*3/uL (ref 150–400)
RBC: 4.63 MIL/uL (ref 3.87–5.11)
RDW: 13.7 % (ref 11.5–15.5)
WBC: 11.3 10*3/uL — AB (ref 4.0–10.5)

## 2017-09-16 LAB — HEPARIN LEVEL (UNFRACTIONATED)
Heparin Unfractionated: 0.82 IU/mL — ABNORMAL HIGH (ref 0.30–0.70)
Heparin Unfractionated: 0.7 IU/mL (ref 0.30–0.70)

## 2017-09-16 LAB — MRSA PCR SCREENING: MRSA by PCR: NEGATIVE

## 2017-09-16 LAB — MAGNESIUM: Magnesium: 1.6 mg/dL — ABNORMAL LOW (ref 1.7–2.4)

## 2017-09-16 MED ORDER — MAGNESIUM SULFATE 2 GM/50ML IV SOLN
2.0000 g | Freq: Once | INTRAVENOUS | Status: AC
  Administered 2017-09-16: 2 g via INTRAVENOUS
  Filled 2017-09-16: qty 50

## 2017-09-16 MED ORDER — POTASSIUM CHLORIDE CRYS ER 20 MEQ PO TBCR: 20.0000 meq | EXTENDED_RELEASE_TABLET | Freq: Every day | ORAL | Status: DC

## 2017-09-16 MED ORDER — POTASSIUM CHLORIDE CRYS ER 20 MEQ PO TBCR
40.0000 meq | EXTENDED_RELEASE_TABLET | Freq: Once | ORAL | Status: AC
  Administered 2017-09-16: 40 meq via ORAL
  Filled 2017-09-16: qty 2

## 2017-09-16 MED ORDER — POTASSIUM CHLORIDE 10 MEQ/100ML IV SOLN
10.0000 meq | INTRAVENOUS | Status: AC
  Administered 2017-09-16 (×4): 10 meq via INTRAVENOUS
  Filled 2017-09-16 (×4): qty 100

## 2017-09-16 MED ORDER — RIVAROXABAN 15 MG PO TABS
15.0000 mg | ORAL_TABLET | Freq: Two times a day (BID) | ORAL | Status: DC
  Administered 2017-09-16 – 2017-09-17 (×3): 15 mg via ORAL
  Filled 2017-09-16 (×4): qty 1

## 2017-09-16 MED ORDER — RIVAROXABAN 20 MG PO TABS
20.0000 mg | ORAL_TABLET | Freq: Every day | ORAL | Status: DC
Start: 2017-09-18 — End: 2017-09-17

## 2017-09-16 MED ORDER — RIVAROXABAN 20 MG PO TABS: 20.0000 mg | ORAL_TABLET | Freq: Every day | ORAL | 3 refills | Status: DC

## 2017-09-16 NOTE — Progress Notes (Signed)
ANTICOAGULATION CONSULT NOTE - [place] for Heparin  Indication: LLE DVT  Allergies  Allergen Reactions  . Penicillins Hives and Rash    Has patient had a PCN reaction causing immediate rash, facial/tongue/throat swelling, SOB or lightheadedness with hypotension: Yes Has patient had a PCN reaction causing severe rash involving mucus membranes or skin necrosis: No Has patient had a PCN reaction that required hospitalization No Has patient had a PCN reaction occurring within the last 10 years: No If all of the above answers are "NO", then may proceed with Cephalosporin use.     Patient Measurements: Height: 5\' 4"  (162.6 cm) Weight: 260 lb (117.9 kg) IBW/kg (Calculated) : 54.7 HEPARIN DW (KG): 83.2   Vital Signs: Temp: 98.1 F (36.7 C) (08/06 0804) Temp Source: Oral (08/06 0804) BP: 140/89 (08/06 0900) Pulse Rate: 51 (08/06 0900)  Labs: Recent Labs    09/14/17 0625  09/15/17 0225 09/16/17 0241 09/16/17 0916  HGB 12.3  --  13.3 13.7  --   HCT 38.8  --  41.1 42.9  --   PLT 236  --  238 256  --   HEPARINUNFRC  --    < > 0.50 0.82* 0.70  CREATININE 0.66  --   --  0.72  --    < > = values in this interval not displayed.    Estimated Creatinine Clearance: 92.1 mL/min (by C-G formula based on SCr of 0.72 mg/dL).   Medical History: Past Medical History:  Diagnosis Date  . Arthritis   . Asthma   . Cancer [facility])    multiple skin cancers  . Diabetes mellitus without complication ([place])   . Diverticulosis   . Fluid retention   . Heart murmur   . High cholesterol   . Hypertension   . Incomplete rotator cuff tear   . PONV (postoperative nausea and vomiting)   . Wears glasses   . Wears partial dentures    bottom    Medications:  Scheduled:  . amLODipine  5 mg Oral Daily  . aspirin EC  81 mg Oral Daily  . insulin aspart  0-20 Units Subcutaneous TID WC  . Rivaroxaban  15 mg Oral BID WC  . [START ON 09/18/2017] rivaroxaban  20 mg Oral Q  supper  . simvastatin  20 mg Oral QHS  . sodium chloride flush  3 mL Intravenous Q12H  . sodium chloride flush  3 mL Intravenous Q12H  . spironolactone  25 mg Oral Daily  . torsemide  60 mg Oral Daily   Infusions:  . sodium chloride Stopped (09/13/17 0914)  . sodium chloride 10 mL/hr at 09/15/17 1420  . sodium chloride      Assessment: Ms. Gallant is a 62 yo F who presented for thrombolysis of LLE DVT, on Xarelto PTA (last dose 7/31). Patient initially on heparin and alteplase for thrombolysis. Alteplase has been stopped and pt continues on heparin alone. Pt is s/p OR for balloon angioplasty, mechanical thrombectomy, and stent placement. Heparin resumed 2 hours post sheath removal at previous therapeutic rate.  Pt is therapeutic at 0.70 on heparin at 2000 units/hr. No bleeding or issues with the infusion nursing.   Patient to transition to rivaroxaban today for discharge. Discussed with Dr. Oneida Alar, she is to finish up her original schedule of rivaroxaban started 7/18.    Plan:  Stop heparin drip 8/6 - 8/7: rivaroxaban 15 mg twice daily  8/8: rivaroxaban 20 mg daily Discussed dosing with patient, verbalized understanding.  Claiborne Billings, PharmD PGY2 Cardiology Pharmacy Resident Phone [phone] 09/16/2017 10:48 AM

## 2017-09-16 NOTE — Discharge Summary (Addendum)
Discharge Summary    Amy Tran February 22, 1955 62 y.o. female  [id number]  Admission Date: 09/12/2017  Discharge Date: 09/16/17  Physician: Elam Dutch, MD  Admission Diagnosis: Iliac vein thrombosis, left [facility]) [I82.422]   HPI:   This is a 62 y.o. female who has had a chronically swollen left leg for about 9 years.  However recently she noticed that the swelling which used to go away or elevation and rest has now persisted.  The left leg is been chronically swollen for about 8 to 9 weeks.  The swelling never subsides.  She apparently had several ultrasounds which showed no evidence of DVT.  She is on aspirin and a statin.  She denies history of hypercoagulable state or previous DVT.  Chronic medical problems include obesity hypertension diabetes.  These are all currently stable.  She did have a gastric bypass in the past and lost about 250 pounds.  She states she has gained back about 30 to 50 pounds.  She denies any history of hemoptysis intracranial bleed or GI bleeding.  She does not complain of shortness of breath at rest.  Hospital Course:  The patient was admitted to the hospital and taken to the operating room on 09/12/2017 and underwent: Ultrasound left groin, left femoral vein cannulation, IVUS of inferior vena cava left common and external iliac vein, AngioJet Zelante take catheter left iliac vein, placement of multi sidehole catheter for iliac vein thrombosis  Operative findings:  #1Acute versus chronic thrombus with left common and external iliac vein #2 8 French sheath left common femoral vein 3.  Placement of UniFuse short TPA catheter left iliac vein  She was transferred back to the ICU.    On 09/13/17, she was taken back to the [place] lab and underwent Intravascular ultrasound of inferior vena cava left common and external iliac vein.    Operative findings: Persistent thrombus distal external iliac vein and common femoral vein.  Operative findings: Persistent  thrombus distal external iliac vein and common femoral vein.  On 09/14/17, pt was doing well.  Plan for pt to return to OR on 09/15/17.   On 09/15/17, she returned to the OR and underwent: 1.  Ultrasound-guided cannulation of left small saphenous vein 2.  IVIS of left popliteal, left femoral, left common femoral, left external iliac, left common iliac veins and IVC 3.  Balloon angioplasty of the left common femoral vein with 10 and 12 mm balloons 4.  Stent of left common and external iliac veins with 14 x 120 mm Vici 5.  Mechanical thrombectomy with Inari clottriever and cleaner wire 6.  Left lower extremity venogram 7.  Moderate sedation with fentanyl Versed 107 minutes  Findings: Popliteal vein was free of disease was cannulated at the small saphenous vein junction.  Femoral vein was free of disease up to the level of the common femoral disease where there was chronic appearing thrombus.  The external iliac  vein was freed to the level of the confluence of common iliac vein where there appeared to be tight stenosis.  There was very tight stenosis with nearly 0  lumen at the level of the confluence of the IVC.  At completion of stenting there was a 14-1/2 x 10-1/2 mm lumen where previously it was occluded.  The stent was patent without any narrowing.  There remains residual chronic thrombus in the left common femoral vein but there is flow via venography.  Were she did continue to have edema in the left lower extremity  she could be considered for open common femoral vein angioplasty and patch on the left.  The pt tolerated the procedure well and was transported to the PACU in good condition.   By the next morning, she was doing well.  She was restarted on her Xarelto and heparin gtt discontinued and she is discharged home on her previous Xarelto anticoagulation.  She is instructed not to restart her Metformin until 09/18/17 due to receiving contrast.  Pt became nauseated prior to discharge.  A CBC was  checked and was hgb was stable.  She was transferred to the stepdown unit.  By the next morning, she was doing well and discharged home.    The remainder of the hospital course consisted of increasing mobilization and increasing intake of solids without difficulty.  CBC    Component Value Date/Time   WBC 11.7 (H) 09/16/2017 0241   RBC 4.76 09/16/2017 0241   HGB 13.7 09/16/2017 0241   HCT 42.9 09/16/2017 0241   PLT 256 09/16/2017 0241   MCV 90.1 09/16/2017 0241   MCH 28.8 09/16/2017 0241   MCHC 31.9 09/16/2017 0241   RDW 13.8 09/16/2017 0241   LYMPHSABS 2.0 09/16/2017 0241   MONOABS 1.0 09/16/2017 0241   EOSABS 0.1 09/16/2017 0241   BASOSABS 0.1 09/16/2017 0241    BMET    Component Value Date/Time   NA 140 09/16/2017 0241   K 2.9 (L) 09/16/2017 0241   CL 95 (L) 09/16/2017 0241   CO2 31 09/16/2017 0241   GLUCOSE 143 (H) 09/16/2017 0241   BUN 12 09/16/2017 0241   CREATININE 0.72 09/16/2017 0241   CALCIUM 9.5 09/16/2017 0241   GFRNONAA >60 09/16/2017 0241   GFRAA >60 09/16/2017 0241      Discharge Instructions    Call MD for:  redness, tenderness, or signs of infection (pain, swelling, bleeding, redness, odor or green/yellow discharge around incision site)   Complete by:  As directed    Call MD for:  severe or increased pain, loss or decreased feeling  in affected limb(s)   Complete by:  As directed    Call MD for:  temperature >100.5   Complete by:  As directed    Discharge instructions   Complete by:  As directed    Elevate legs when not walking.  Will need to wear compression stockings daily.   Discharge instructions   Complete by:  As directed    Do not restart your Metformin until September 18, 2017 due to receiving contrast for your procedure.   Discharge patient   Complete by:  As directed    Discharge pt home after Xarelto has been restarted and heparin off.  Please discuss with pt to not restart Metformin until 09/18/17 due to receiving contrast.  Thanks!    Discharge disposition:  01-Home or Self Care   Discharge patient date:  09/16/2017   Discharge wound care:   Complete by:  As directed    Shower daily with soap and water starting 09/17/17   Driving Restrictions   Complete by:  As directed    No driving for 1 week   Lifting restrictions   Complete by:  As directed    No heavy lifting for 4 weeks   Resume previous diet   Complete by:  As directed       Discharge Diagnosis:  Iliac vein thrombosis, left ([place]) [I82.422]  Secondary Diagnosis: Patient Active Problem List   Diagnosis Date Noted  . Iliac vein thrombosis, left ([facility])  09/12/2017  . Hypokalemia 04/02/2017  . GERD (gastroesophageal reflux disease) 10/21/2016  . Abdominal pain 10/21/2016  . Extrinsic asthma without complication 82/95/6213  . Oral thrush 07/01/2016  . Rectocele 09/19/2015  . Diverticulosis of colon without hemorrhage   . Hx of adenomatous colonic polyps 12/01/2013  . Pain in joint, shoulder region 06/24/2013  . Muscle weakness (generalized) 06/24/2013  . Incomplete rotator cuff tear   . Diabetes mellitus without complication ([place])   . PONV (postoperative nausea and vomiting)   . Fluid retention   . Hypertension    Past Medical History:  Diagnosis Date  . Arthritis   . Asthma   . Cancer [facility]    multiple skin cancers  . Diabetes mellitus without complication ([place])   . Diverticulosis   . Fluid retention   . Heart murmur   . High cholesterol   . Hypertension   . Incomplete rotator cuff tear   . PONV (postoperative nausea and vomiting)   . Wears glasses   . Wears partial dentures    bottom     Allergies as of 09/16/2017      Reactions   Penicillins Hives, Rash   Has patient had a PCN reaction causing immediate rash, facial/tongue/throat swelling, SOB or lightheadedness with hypotension: Yes Has patient had a PCN reaction causing severe rash involving mucus membranes or skin necrosis: No Has patient had a PCN reaction that required  hospitalization No Has patient had a PCN reaction occurring within the last 10 years: No If all of the above answers are "NO", then may proceed with Cephalosporin use.      Medication List    TAKE these medications   amLODipine 5 MG tablet Commonly known as:  NORVASC Take 5 mg by mouth daily.   aspirin EC 81 MG tablet Take 81 mg by mouth daily.   glipiZIDE 5 MG tablet Commonly known as:  GLUCOTROL Take 5 mg by mouth daily before breakfast.   INVOKANA 300 MG Tabs tablet Generic drug:  canagliflozin Take 300 mg by mouth daily before breakfast.   metFORMIN 1000 MG tablet Commonly known as:  GLUCOPHAGE Take 1,000 mg by mouth daily with breakfast.   pantoprazole 40 MG tablet Commonly known as:  PROTONIX Take 40 mg by mouth daily.   potassium chloride SA 20 MEQ tablet Commonly known as:  K-DUR,KLOR-CON Take 1 tablet (20 mEq total) by mouth daily.   PROAIR HFA 108 (90 Base) MCG/ACT inhaler Generic drug:  albuterol INHALE 2 PUFFS INTO THE LUNGS EVERY 6 (SIX) HOURS AS NEEDED FOR WHEEZING OR SHORTNESS OF BREATH.   Rivaroxaban 15 & 20 MG Tbpk Take as directed on package: Start with one 15mg  tablet by mouth twice a day with food. On Day 22, switch to one 20mg  tablet once a day with food. What changed:  Another medication with the same name was added. Make sure you understand how and when to take each.   rivaroxaban 20 MG Tabs tablet Commonly known as:  XARELTO Take 1 tablet (20 mg total) by mouth daily with supper. Start taking on:  09/18/2017 What changed:  You were already taking a medication with the same name, and this prescription was added. Make sure you understand how and when to take each.   simvastatin 20 MG tablet Commonly known as:  ZOCOR Take 20 mg by mouth at bedtime.   spironolactone 25 MG tablet Commonly known as:  ALDACTONE Take 25 mg by mouth daily.   torsemide 20 MG tablet Commonly  known as:  DEMADEX TAKE 2 TABS IN AM AND 1 TAB IN PM What changed:     how much to take  how to take this  when to take this  additional instructions            Discharge Care Instructions  (From admission, onward)        Start     Ordered   09/16/17 0000  Discharge wound care:    Comments:  Shower daily with soap and water starting 09/17/17   09/16/17 1004      Prescriptions given: Non given  Instructions: 1.  No driving for one week 2.  No heavy lifting x 4 weeks 3.  Shower daily starting 09/17/17 4.  Do not restart Metformin until 09/18/17 due to receiving contrast 5.  Elevate legs while not walking and wear compression stockings daily. 6.  May return to work on 09/22/17  Disposition: home  Patient's condition: is Good  Follow up: 1. Dr. Oneida Alar in 4 weeks with venous duplex left iliac vein and LLE   Leontine Locket, PA-C [facility] [phone] 09/16/2017  10:47 AM

## 2017-09-16 NOTE — Progress Notes (Signed)
[facility] [place]  Subjective  - feels ok less swelling   Objective (!) 170/107 (!) 51 98.1 F (36.7 C) (Oral) 11 92%  Intake/Output Summary (Last 24 hours) at 09/16/2017 0849 Last data filed at 09/16/2017 0800 Gross per 24 hour  Intake 743.86 ml  Output 1935 ml  Net -1191.14 ml   Post iliac stent less edema no hematoma  Assessment/Planning: D/c home today Restart her home xarelto with a dose prior to d/c D/c heparin after receiving first xarelto dose D/c foley  [name] 09/16/2017 8:49 AM --  Laboratory Lab Results: Recent Labs    09/15/17 0225 09/16/17 0241  WBC 11.1* 11.7*  HGB 13.3 13.7  HCT 41.1 42.9  PLT 238 256   BMET Recent Labs    09/14/17 0625 09/16/17 0241  NA 140 140  K 3.1* 2.9*  CL 101 95*  CO2 28 31  GLUCOSE 162* 143*  BUN 10 12  CREATININE 0.66 0.72  CALCIUM 8.9 9.5    COAG Lab Results  Component Value Date   INR 0.93 06/08/2017   No results found for: PTT

## 2017-09-16 NOTE — Progress Notes (Signed)
ANTICOAGULATION CONSULT NOTE - [place] for Heparin  Indication: LLE DVT  Allergies  Allergen Reactions  . Penicillins Hives and Rash    Has patient had a PCN reaction causing immediate rash, facial/tongue/throat swelling, SOB or lightheadedness with hypotension: Yes Has patient had a PCN reaction causing severe rash involving mucus membranes or skin necrosis: No Has patient had a PCN reaction that required hospitalization No Has patient had a PCN reaction occurring within the last 10 years: No If all of the above answers are "NO", then may proceed with Cephalosporin use.     Patient Measurements: Height: 5\' 4"  (601.0 cm) Weight: 260 lb (117.9 kg) IBW/kg (Calculated) : 54.7 HEPARIN DW (KG): 83.2   Vital Signs: Temp: 98.1 F (36.7 C) (08/05 2000) Temp Source: Oral (08/05 2000) BP: 133/75 (08/05 2000) Pulse Rate: 44 (08/05 2000)  Labs: Recent Labs    09/13/17 0348  09/14/17 0625 09/14/17 1013 09/15/17 0225 09/16/17 0241  HGB 12.3  --  12.3  --  13.3 13.7  HCT 38.5  --  38.8  --  41.1 42.9  PLT 239  --  236  --  238 256  HEPARINUNFRC <0.10*   < >  --  0.37 0.50 0.82*  CREATININE 0.67  --  0.66  --   --  0.72   < > = values in this interval not displayed.    Estimated Creatinine Clearance: 92.1 mL/min (by C-G formula based on SCr of 0.72 mg/dL).   Medical History: Past Medical History:  Diagnosis Date  . Arthritis   . Asthma   . Cancer [facility])    multiple skin cancers  . Diabetes mellitus without complication ([place])   . Diverticulosis   . Fluid retention   . Heart murmur   . High cholesterol   . Hypertension   . Incomplete rotator cuff tear   . PONV (postoperative nausea and vomiting)   . Wears glasses   . Wears partial dentures    bottom    Medications:  Scheduled:  . amLODipine  5 mg Oral Daily  . aspirin EC  81 mg Oral Daily  . insulin aspart  0-20 Units Subcutaneous TID WC  . simvastatin  20 mg Oral QHS  . sodium  chloride flush  3 mL Intravenous Q12H  . sodium chloride flush  3 mL Intravenous Q12H  . spironolactone  25 mg Oral Daily  . torsemide  60 mg Oral Daily   Infusions:  . sodium chloride Stopped (09/13/17 0914)  . sodium chloride 10 mL/hr at 09/15/17 1420  . sodium chloride    . heparin 2,150 Units/hr (09/15/17 2300)    Assessment: Amy Tran is a 62 yo F who presented for thrombolysis of LLE DVT, on Xarelto PTA (last dose 7/31). Patient initially on heparin and alteplase for thrombolysis. Alteplase has been stopped and pt continues on heparin alone. Pt is s/p OR for balloon angioplasty, mechanical thrombectomy, and stent placement. Heparin resumed 2 hours post sheath removal at previous therapeutic rate.  Pt now supratherapeutic at 0.82 on heparin at 2150 units/hr. No bleeding or issues with the infusion per discussion with RN and level drawn appropriately from opposite arm.  Goal of Therapy:  Heparin level 0.3-0.7 units/ml Monitor platelets by anticoagulation protocol: Yes   Plan:  - Decrease heparin to 2000 units/hr - 6h heparin level - Monitor daily heparin level and CBC, s/sx bleeding  Elicia Lamp, PharmD, BCPS Clinical Pharmacist Please check AMION for all [facility]  Pharmacy contact numbers 09/16/2017 3:42 AM

## 2017-09-16 NOTE — Progress Notes (Signed)
Transferred to [address] via wheelchair. Portable monitor and oxygen on. Report given to [facility]. No changes.

## 2017-09-16 NOTE — Progress Notes (Signed)
[place] Progress Note Patient Name: Amy Tran DOB: 1955/04/11 MRN: [id number]   Date of Service  09/16/2017  HPI/Events of Note  Hypokalemia k2.9  eICU Interventions  50meq iv and 40 meq po     Intervention Category Minor Interventions: Electrolytes abnormality - evaluation and management  Sharia Reeve 09/16/2017, 5:18 AM

## 2017-09-17 LAB — CBC WITH DIFFERENTIAL/PLATELET
Abs Immature Granulocytes: 0.1 10*3/uL (ref 0.0–0.1)
BASOS ABS: 0 10*3/uL (ref 0.0–0.1)
Basophils Relative: 0 %
EOS PCT: 2 %
Eosinophils Absolute: 0.2 10*3/uL (ref 0.0–0.7)
HCT: 40.5 % (ref 36.0–46.0)
HEMOGLOBIN: 13.1 g/dL (ref 12.0–15.0)
Immature Granulocytes: 1 %
LYMPHS PCT: 17 %
Lymphs Abs: 1.8 10*3/uL (ref 0.7–4.0)
MCH: 29.1 pg (ref 26.0–34.0)
MCHC: 32.3 g/dL (ref 30.0–36.0)
MCV: 90 fL (ref 78.0–100.0)
Monocytes Absolute: 1.2 10*3/uL — ABNORMAL HIGH (ref 0.1–1.0)
Monocytes Relative: 11 %
NEUTROS ABS: 7.5 10*3/uL (ref 1.7–7.7)
NEUTROS PCT: 69 %
Platelets: 248 10*3/uL (ref 150–400)
RBC: 4.5 MIL/uL (ref 3.87–5.11)
RDW: 13.6 % (ref 11.5–15.5)
WBC: 10.7 10*3/uL — AB (ref 4.0–10.5)

## 2017-09-17 LAB — GLUCOSE, CAPILLARY
GLUCOSE-CAPILLARY: 184 mg/dL — AB (ref 70–99)
Glucose-Capillary: 166 mg/dL — ABNORMAL HIGH (ref 70–99)

## 2017-09-17 NOTE — Progress Notes (Signed)
[facility] [place]  Subjective  - feels better back still sore but improved no nausea   Objective (!) 147/80 (!) 48 98.2 F (36.8 C) (Oral) 15 98%  Intake/Output Summary (Last 24 hours) at 09/17/2017 7262 Last data filed at 09/16/2017 1300 Gross per 24 hour  Intake 409.13 ml  Output 350 ml  Net 59.13 ml   Left leg no hematoma No abdominal pain Left leg swelling about 50% better  Assessment/Planning: D/c home today on xarelto Follow up in a few weeks with venous duplex Return to work 8/12 She should ambulate as much as possible and wear compression stockings.  Emphasized to pt.  [name] 09/17/2017 8:22 AM --  Laboratory Lab Results: Recent Labs    09/16/17 1144 09/17/17 0259  WBC 11.3* 10.7*  HGB 13.7 13.1  HCT 41.6 40.5  PLT 247 248   BMET Recent Labs    09/16/17 0241  NA 140  K 2.9*  CL 95*  CO2 31  GLUCOSE 143*  BUN 12  CREATININE 0.72  CALCIUM 9.5    COAG Lab Results  Component Value Date   INR 0.93 06/08/2017   No results found for: PTT

## 2017-09-17 NOTE — Discharge Instructions (Signed)
May return to work on Monday, September 22, 2017.

## 2017-09-17 NOTE — Progress Notes (Signed)
Pt educated and discharged instructions reviewed with pt and family at bedsie. Iv's removed and intact. Pt has no questions. All belongings sent with family. Pt has no complaints. Pt transported via wheelchair to family at [place] parking. Jerald Kief, RN

## 2017-09-22 ENCOUNTER — Telehealth: Payer: Self-pay | Admitting: Vascular Surgery

## 2017-09-22 NOTE — Telephone Encounter (Signed)
sch appt spk to pt 11/06/17 9am IVC/iliac 10am LE Venous 1045am p/o MD

## 2017-09-23 ENCOUNTER — Other Ambulatory Visit: Payer: Self-pay

## 2017-09-23 DIAGNOSIS — R609 Edema, unspecified: Secondary | ICD-10-CM

## 2017-09-23 DIAGNOSIS — I82402 Acute embolism and thrombosis of unspecified deep veins of left lower extremity: Secondary | ICD-10-CM

## 2017-09-24 ENCOUNTER — Other Ambulatory Visit: Payer: Self-pay

## 2017-09-24 DIAGNOSIS — I82402 Acute embolism and thrombosis of unspecified deep veins of left lower extremity: Secondary | ICD-10-CM

## 2017-09-24 DIAGNOSIS — Z48812 Encounter for surgical aftercare following surgery on the circulatory system: Secondary | ICD-10-CM

## 2017-09-24 DIAGNOSIS — R609 Edema, unspecified: Secondary | ICD-10-CM

## 2017-09-25 ENCOUNTER — Other Ambulatory Visit: Payer: Self-pay

## 2017-09-25 DIAGNOSIS — I82402 Acute embolism and thrombosis of unspecified deep veins of left lower extremity: Secondary | ICD-10-CM

## 2017-09-25 DIAGNOSIS — R609 Edema, unspecified: Secondary | ICD-10-CM

## 2017-11-06 ENCOUNTER — Other Ambulatory Visit: Payer: Self-pay

## 2017-11-06 ENCOUNTER — Ambulatory Visit (HOSPITAL_COMMUNITY)
Admission: RE | Admit: 2017-11-06 | Discharge: 2017-11-06 | Disposition: A | Discharge status: Home / Self Care | Payer: Medicaid Other | Source: Ambulatory Visit | Attending: Vascular Surgery | Admitting: Vascular Surgery

## 2017-11-06 ENCOUNTER — Ambulatory Visit (INDEPENDENT_AMBULATORY_CARE_PROVIDER_SITE_OTHER)
Admission: RE | Admit: 2017-11-06 | Discharge: 2017-11-06 | Disposition: A | Discharge status: Home / Self Care | Payer: Medicaid Other | Source: Ambulatory Visit | Attending: Vascular Surgery | Admitting: Vascular Surgery

## 2017-11-06 ENCOUNTER — Ambulatory Visit (INDEPENDENT_AMBULATORY_CARE_PROVIDER_SITE_OTHER): Payer: Medicaid Other | Admitting: Vascular Surgery

## 2017-11-06 ENCOUNTER — Encounter: Payer: Self-pay | Admitting: Vascular Surgery

## 2017-11-06 VITALS — BP 146/85 | HR 51 | Temp 97.4°F | Resp 20 | Ht 64.0 in | Wt 269.0 lb

## 2017-11-06 DIAGNOSIS — I82402 Acute embolism and thrombosis of unspecified deep veins of left lower extremity: Secondary | ICD-10-CM

## 2017-11-06 DIAGNOSIS — R609 Edema, unspecified: Secondary | ICD-10-CM

## 2017-11-06 DIAGNOSIS — Z48812 Encounter for surgical aftercare following surgery on the circulatory system: Secondary | ICD-10-CM | POA: Diagnosis present

## 2017-11-06 NOTE — Progress Notes (Signed)
Patient is a 62 year old female who returns today for follow-up after recent left common and external iliac artery stenting and thrombolysis of a left leg DVT.  She reports she still has some swelling that occurs in her left leg when she is standing for long periods of time.  She has been compliant with her Xarelto.  She does want to stop this for a few days in the near future for a dental procedure and I told her this will probably be all right as the stent should be fully endothelialized since it is now more than 6 weeks.  However she needs to resume her Xarelto immediately after the procedure.  I discussed with her that the Xarelto would need to be continued for a full year.  She still states she gets some swelling in her leg but this is reasonably controlled with a compression stocking.  Other chronic medical problems include hypertension diabetes elevated cholesterol.  These are all currently controlled.  Current Outpatient Medications on File Prior to Visit  Medication Sig Dispense Refill  . amLODipine (NORVASC) 5 MG tablet Take 5 mg by mouth daily.    Marland Kitchen aspirin EC 81 MG tablet Take 81 mg by mouth daily.    . canagliflozin (INVOKANA) 300 MG TABS tablet Take 300 mg by mouth daily before breakfast.    . glipiZIDE (GLUCOTROL) 5 MG tablet Take 5 mg by mouth daily before breakfast.     . metFORMIN (GLUCOPHAGE) 1000 MG tablet Take 1,000 mg by mouth daily with breakfast.     . pantoprazole (PROTONIX) 40 MG tablet Take 40 mg by mouth daily.    . potassium chloride SA (K-DUR,KLOR-CON) 20 MEQ tablet Take 1 tablet (20 mEq total) by mouth daily.    Marland Kitchen PROAIR HFA 108 (90 Base) MCG/ACT inhaler INHALE 2 PUFFS INTO THE LUNGS EVERY 6 (SIX) HOURS AS NEEDED FOR WHEEZING OR SHORTNESS OF BREATH. 8.5 Inhaler 0  . rivaroxaban (XARELTO) 20 MG TABS tablet Take 1 tablet (20 mg total) by mouth daily with supper. 30 tablet 3  . Rivaroxaban 15 & 20 MG TBPK Take as directed on package: Start with one 15mg  tablet by mouth twice  a day with food. On Day 22, switch to one 20mg  tablet once a day with food. 51 each 3  . simvastatin (ZOCOR) 20 MG tablet Take 20 mg by mouth at bedtime.     Marland Kitchen spironolactone (ALDACTONE) 25 MG tablet Take 25 mg by mouth daily.    Marland Kitchen torsemide (DEMADEX) 20 MG tablet TAKE 2 TABS IN AM AND 1 TAB IN PM (Patient taking differently: Take 60 mg by mouth daily. ) 90 tablet 3   No current facility-administered medications on file prior to visit.    Review of systems: She denies chest pain.  She does develop shortness of breath with exertion.  Family History  Problem Relation Age of Onset  . Diabetes Father   . Hypertension Father   . Parkinson's disease Father   . Cancer Father        not sure what kind  . Hypertension Mother   . Heart disease Mother   . Drug abuse Sister   . Mesothelioma Brother   . Cancer Daughter        non-hodgkins lmphoma  . Hypertension Brother   . Diabetes Brother   . Colon cancer Neg Hx   . Gastric cancer Neg Hx   . Esophageal cancer Neg Hx     Social History   Socioeconomic History  .  Marital status: Widowed    Spouse name: Not on file  . Number of children: 2  . Years of education: Not on file  . Highest education level: Not on file  Occupational History  . Occupation: works at [employer]: [place]  . Financial resource strain: Not on file  . Food insecurity:    Worry: Not on file    Inability: Not on file  . Transportation needs:    Medical: Not on file    Non-medical: Not on file  Tobacco Use  . Smoking status: Current Every Day Smoker    Packs/day: 0.50    Years: 29.00    Pack years: 14.50    Types: Cigarettes    Start date: 04/09/1970  . Smokeless tobacco: Never Used  Substance and Sexual Activity  . Alcohol use: No  . Drug use: No  . Sexual activity: Not Currently    Birth control/protection: Post-menopausal  Lifestyle  . Physical activity:    Days per week: Patient refused    Minutes per session: Patient  refused  . Stress: Patient refused  Relationships  . Social connections:    Talks on phone: Patient refused    Gets together: Patient refused    Attends religious service: Patient refused    Active member of club or organization: Patient refused    Attends meetings of clubs or organizations: Patient refused    Relationship status: Patient refused  . Intimate partner violence:    Fear of current or ex partner: Patient refused    Emotionally abused: Patient refused    Physically abused: Patient refused    Forced sexual activity: Patient refused  Other Topics Concern  . Not on file  Social History Narrative  . Not on file    Physical exam:  Vitals:   11/06/17 1059  BP: (!) 146/85  Pulse: (!) 51  Resp: 20  Temp: (!) 97.4 F (36.3 C)  TempSrc: Oral  SpO2: 99%  Weight: 269 lb (122 kg)  Height: 5\' 4"  (1.626 m)    Extremities: 2+ dorsalis pedis pulse right foot no palpable pulses left foot edema left lower extremity extending from the knee down to the foot left leg approximately 10% larger than the right multiple scattered varicosities bilateral foot and calf  Data: Patient had a duplex ultrasound of her inferior vena cava and iliac veins today.  This showed a patent left common iliac and external iliac artery stent as well as the vena cava.  I reviewed and interpreted the study.  Assessment: Patent left common and external iliac artery stent still with residual left leg swelling hopefully will improve with time.  Patient was advised to continue wearing her left lower extremity compression garment to reduce risk of long-term postphlebitic syndrome.  She will need to continue her Xarelto for a full year and then switch to aspirin.  I did discuss with her that it is okay to stop this peri-procedure if necessary but to resume afterward.  She will see our nurse practitioner in 6 months time with repeat iliac duplex.  She will call us sooner if she needs refills on her Xarelto or other  problems.  Plan: See above  [name], MD [facility] Office: [phone] Pager: [phone]

## 2017-12-09 ENCOUNTER — Other Ambulatory Visit: Payer: Self-pay

## 2017-12-11 ENCOUNTER — Other Ambulatory Visit: Payer: Self-pay

## 2018-02-22 ENCOUNTER — Other Ambulatory Visit (HOSPITAL_COMMUNITY): Payer: Self-pay | Admitting: Physician Assistant

## 2018-02-25 DIAGNOSIS — Z0279 Encounter for issue of other medical certificate: Secondary | ICD-10-CM | POA: Diagnosis not present

## 2018-03-09 ENCOUNTER — Other Ambulatory Visit: Payer: Self-pay

## 2018-03-09 DIAGNOSIS — I82402 Acute embolism and thrombosis of unspecified deep veins of left lower extremity: Secondary | ICD-10-CM

## 2018-03-16 NOTE — Progress Notes (Signed)
HISTORY AND PHYSICAL     CC:  follow up. Requesting Provider:  Asencion Noble, MD  HPI: This is a 63 y.o. female who is here today for follow up. On 09/12/17, she underwent IVUS of the IVC, left CIV and EIV, angiojet by Dr. Oneida Alar.  She was taken back on 09/15/17 by Dr. Donzetta Matters and she had stent pf left CIV and EIV with 14 x 198mm Vici, balloon angioplasty of the left CFV, mechanical thrombectomy with Inari clot retriever and cleaner wire.    She was seen back in the office by Dr. Oneida Alar on 11/06/17.  At that time, she continued to have some swelling in her left leg after standing for long periods of time.  Her swelling was reasonably controlled with compression socks.   She was compliant with her Xarelto.   The pt returns today for follow up.  She states she has not been wearing her compression socks and has been on her feet working a lot more lately.  She says that her left leg has swollen more.  She states that the swelling had much improved with compression and elevation so she stopped wearing them.  She does have some redness bilaterally below the knee with the left more than right that started about 3 days ago.  She denies any pain in her legs and just describes them as heavy.   The pt is on a statin for cholesterol management.    The pt does have diabetes. The pt is on CCB and Aldactone for hypertension.  The pt is on an aspirin.  Tobacco hx:  Everyday smoker  The pt is on Xarelto (will need to be on this a year and then start asa)  Past Medical History:  Diagnosis Date  . Arthritis   . Asthma   . Cancer [facility])    multiple skin cancers  . Diabetes mellitus without complication ([place])   . Diverticulosis   . Fluid retention   . Heart murmur   . High cholesterol   . Hypertension   . Incomplete rotator cuff tear   . PONV (postoperative nausea and vomiting)   . Wears glasses   . Wears partial dentures    bottom    Past Surgical History:  Procedure Laterality Date  . APPENDECTOMY    .  CARPAL TUNNEL RELEASE Bilateral 2001   bilateral  . CERVICAL POLYPECTOMY  04/08/2017   Procedure: POLYPECTOMY;  Surgeon: [name], MD;  Location: [facility];  Service: Gynecology;;  Endometrial  . CHOLECYSTECTOMY    . COLONOSCOPY  2007   ZCH:YIFOYDX internal hemorrhoids. Diminutive rectal polyp at 10 cm, cold biopsied/removed. The remainder of the rectal mucosa appeared normal Swallow left-sided diverticula. Diminutive polyp at the splenic flexure cold biopsied/removed (adenomatous)  . COLONOSCOPY N/A 12/28/2013   Procedure: COLONOSCOPY;  Surgeon: Daneil Dolin, MD;  Location: [facility];  Service: Endoscopy;  Laterality: N/A;  730 - moved to 8:30 - Ginger notified pt  . DILATION AND CURETTAGE, DIAGNOSTIC / THERAPEUTIC  03/07/2017  . ERCP  2002  . GASTRIC BYPASS  2004  . HYSTEROSCOPY W/D&C N/A 04/08/2017   Procedure: DILATATION AND CURETTAGE /HYSTEROSCOPY;  Surgeon: [name], MD;  Location: [facility];  Service: Gynecology;  Laterality: N/A;  . INTRAVASCULAR ULTRASOUND/IVUS N/A 09/12/2017   Procedure: INTRAVASCULAR ULTRASOUND/IVUS;  Surgeon: Elam Dutch, MD;  Location: [place] [facility];  Service: Cardiovascular;  Laterality: N/A;  . NECK SURGERY  1998   fusion  . PERCUTANEOUS  VENOUS THROMBECTOMY,LYSIS WITH INTRAVASCULAR ULTRASOUND (IVUS) Left 09/13/2017   Procedure: REMOVAL OF LYSIS CATHETER AND INTRAVASCULAR ULTRASOUND (IVUS) LEFT ILIAC VEIN;  Surgeon: Elam Dutch, MD;  Location: [place];  Service: Vascular;  Laterality: Left;  . PERIPHERAL VASCULAR INTERVENTION  09/15/2017   Procedure: PERIPHERAL VASCULAR INTERVENTION;  Surgeon: Waynetta Sandy, MD;  Location: [place] [facility];  Service: Cardiovascular;;  VEINOUS/STENT  . PERIPHERAL VASCULAR THROMBECTOMY Left 09/12/2017   Procedure: PERIPHERAL VASCULAR THROMBECTOMY;  Surgeon: Elam Dutch, MD;  Location: [place] [facility];  Service: Cardiovascular;  Laterality: Left;  . PERIPHERAL VASCULAR THROMBECTOMY Left  09/15/2017   Procedure: PERIPHERAL VASCULAR THROMBECTOMY;  Surgeon: Waynetta Sandy, MD;  Location: [place] [facility];  Service: Cardiovascular;  Laterality: Left;  IVC TO LT FEM/POP VEIN  . SHOULDER ARTHROSCOPY WITH SUBACROMIAL DECOMPRESSION Left 06/23/2013   Procedure: LEFT SHOULDER ARTHROSCOPY WITH DEBRIDEMENT ROTATOR CUFF AND LABRUM;  Surgeon: Lorn Junes, MD;  Location: [place];  Service: Orthopedics;  Laterality: Left;  . SHOULDER SURGERY  1999   left    Allergies  Allergen Reactions  . Penicillins Hives and Rash    Has patient had a PCN reaction causing immediate rash, facial/tongue/throat swelling, SOB or lightheadedness with hypotension: Yes Has patient had a PCN reaction causing severe rash involving mucus membranes or skin necrosis: No Has patient had a PCN reaction that required hospitalization No Has patient had a PCN reaction occurring within the last 10 years: No If all of the above answers are "NO", then may proceed with Cephalosporin use.     Current Outpatient Medications  Medication Sig Dispense Refill  . amLODipine (NORVASC) 5 MG tablet Take 5 mg by mouth daily.    Marland Kitchen aspirin EC 81 MG tablet Take 81 mg by mouth daily.    . canagliflozin (INVOKANA) 300 MG TABS tablet Take 300 mg by mouth daily before breakfast.    . glipiZIDE (GLUCOTROL) 5 MG tablet Take 5 mg by mouth daily before breakfast.     . metFORMIN (GLUCOPHAGE) 1000 MG tablet Take 1,000 mg by mouth daily with breakfast.     . pantoprazole (PROTONIX) 40 MG tablet Take 40 mg by mouth daily.    . potassium chloride SA (K-DUR,KLOR-CON) 20 MEQ tablet Take 1 tablet (20 mEq total) by mouth daily.    Marland Kitchen PROAIR HFA 108 (90 Base) MCG/ACT inhaler INHALE 2 PUFFS INTO THE LUNGS EVERY 6 (SIX) HOURS AS NEEDED FOR WHEEZING OR SHORTNESS OF BREATH. 8.5 Inhaler 0  . Rivaroxaban 15 & 20 MG TBPK Take as directed on package: Start with one 15mg  tablet by mouth twice a day with food. On Day 22, switch to  one 20mg  tablet once a day with food. 51 each 3  . simvastatin (ZOCOR) 20 MG tablet Take 20 mg by mouth at bedtime.     Marland Kitchen spironolactone (ALDACTONE) 25 MG tablet Take 25 mg by mouth daily.    Marland Kitchen torsemide (DEMADEX) 20 MG tablet TAKE 2 TABS IN AM AND 1 TAB IN PM (Patient taking differently: Take 60 mg by mouth daily. ) 90 tablet 3  . XARELTO 20 MG TABS tablet TAKE 1 TABLET (20 MG TOTAL) BY MOUTH DAILY WITH SUPPER. 30 tablet 3   No current facility-administered medications for this visit.     Family History  Problem Relation Age of Onset  . Diabetes Father   . Hypertension Father   . Parkinson's disease Father   . Cancer Father  not sure what kind  . Hypertension Mother   . Heart disease Mother   . Drug abuse Sister   . Mesothelioma Brother   . Cancer Daughter        non-hodgkins lmphoma  . Hypertension Brother   . Diabetes Brother   . Colon cancer Neg Hx   . Gastric cancer Neg Hx   . Esophageal cancer Neg Hx     Social History   Socioeconomic History  . Marital status: Widowed    Spouse name: Not on file  . Number of children: 2  . Years of education: Not on file  . Highest education level: Not on file  Occupational History  . Occupation: works at [employer]: [place]  . Financial resource strain: Not on file  . Food insecurity:    Worry: Not on file    Inability: Not on file  . Transportation needs:    Medical: Not on file    Non-medical: Not on file  Tobacco Use  . Smoking status: Current Every Day Smoker    Packs/day: 0.50    Years: 29.00    Pack years: 14.50    Types: Cigarettes    Start date: 04/09/1970  . Smokeless tobacco: Never Used  Substance and Sexual Activity  . Alcohol use: No  . Drug use: No  . Sexual activity: Not Currently    Birth control/protection: Post-menopausal  Lifestyle  . Physical activity:    Days per week: Patient refused    Minutes per session: Patient refused  . Stress: Patient refused    Relationships  . Social connections:    Talks on phone: Patient refused    Gets together: Patient refused    Attends religious service: Patient refused    Active member of club or organization: Patient refused    Attends meetings of clubs or organizations: Patient refused    Relationship status: Patient refused  . Intimate partner violence:    Fear of current or ex partner: Patient refused    Emotionally abused: Patient refused    Physically abused: Patient refused    Forced sexual activity: Patient refused  Other Topics Concern  . Not on file  Social History Narrative  . Not on file     REVIEW OF SYSTEMS:   [X]  denotes positive finding, [ ]  denotes negative finding Cardiac  Comments:  Chest pain or chest pressure:    Shortness of breath upon exertion:    Short of breath when lying flat:    Irregular heart rhythm:        Vascular    Pain in calf, thigh, or hip brought on by ambulation:    Pain in feet at night that wakes you up from your sleep:     Blood clot in your veins:    Leg swelling:  x       Pulmonary    Oxygen at home:    Productive cough:     Wheezing:         Neurologic    Sudden weakness in arms or legs:     Sudden numbness in arms or legs:     Sudden onset of difficulty speaking or slurred speech:    Temporary loss of vision in one eye:     Problems with dizziness:         Gastrointestinal    Blood in stool:     Vomited blood:  Genitourinary    Burning when urinating:     Blood in urine:        Psychiatric    Major depression:         Hematologic    Bleeding problems:    Problems with blood clotting too easily:        Skin    Rashes or ulcers:        Constitutional    Fever or chills:      PHYSICAL EXAMINATION:  Today's Vitals   03/17/18 0837  BP: (!) 140/91  Pulse: (!) 43  Resp: 18  SpO2: 94%  Weight: 265 lb (120.2 kg)  Height: 5\' 4"  (1.626 m)   Body mass index is 45.49 kg/m.   General:  WDWN in NAD; vital  signs documented above Gait: Not observed HENT: WNL, normocephalic Pulmonary: normal non-labored breathing , without Rales, rhonchi,  wheezing Cardiac: regular HR, without  Murmurs; without carotid bruits Abdomen: soft, NT, no masses Skin: with erythema below knees bilaterally with left>right Vascular Exam/Pulses:  Right Left  Radial 2+ (normal) 2+ (normal)  Ulnar Unable to palpate  Unable to palpate   DP Brisk doppler flow Brisk doppler flow  PT Brisk doppler flow Brisk doppler flow   Extremities: without ischemic changes, without Gangrene , without cellulitis; without open wounds;  +BLE edema with left>right Musculoskeletal: no muscle wasting or atrophy  Neurologic: A&O X 3;  No focal weakness or paresthesias are detected Psychiatric:  The pt has Normal affect.   Non-Invasive Vascular Imaging:   IVC iliac venous duplex 03/17/2018: IVC patent Left CIV: patent Lefit EIV: patent  Venous duplex on 11/06/17: IVC patent to the proximal to mid segment, distal IVC not visualized.  The right proximal CIV and EIV appears patent. The left CIV appears patent as well as the mid to distal EIV   Pt meds includes: Statin:  Yes.   Beta Blocker:  No. Aspirin:  Yes.   ACEI:  No. ARB:  No. CCB use:  Yes Other Antiplatelet/Anticoagulant:  Yes Xarelto   ASSESSMENT/PLAN:: 63 y.o. female here for follow up for Ultrasound left groin, left femoral vein cannulation, IVUS of inferior vena cava left common and external iliac vein, AngioJet [place] take catheter left iliac vein, placement of multi sidehole catheter for iliac vein thrombosis on 09/12/17 by Dr. Oneida Alar and subsequent procedure by Dr. Donzetta Matters on 09/15/17: 1.  Ultrasound-guided cannulation of left small saphenous vein 2.  IVIS of left popliteal, left femoral, left common femoral, left external iliac, left common iliac veins and IVC 3.  Balloon angioplasty of the left common femoral vein with 10 and 12 mm balloons 4.  Stent of left common and  external iliac veins with 14 x 120 mm Vici 5.  Mechanical thrombectomy with Inari clottriever and cleaner wire 6.  Left lower extremity venogram   -pt IVC and iliac venous duplex is patent.  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  She also has some erythema over both lower extremities below the knee with left>right that has been present for about 3 days and could represent a mild cellulitis.  Gave 5 days of Bactrim. -discussed the importance of wearing her compression everyday -putting them on before she gets out of bed and taking them off when she goes to bed as well as elevation and the proper way to elevate.   -she will f/u in 6 months with  repeat IVC duplex.  -she will call sooner should she need [place].  -per Dr. Oneida Alar note, she will need to be on Xarelto for one year.    Leontine Locket, PA-C [facility] [phone]  Clinic MD:   Early

## 2018-03-17 ENCOUNTER — Other Ambulatory Visit: Payer: Self-pay

## 2018-03-17 ENCOUNTER — Encounter: Payer: Self-pay | Admitting: Family

## 2018-03-17 ENCOUNTER — Ambulatory Visit (INDEPENDENT_AMBULATORY_CARE_PROVIDER_SITE_OTHER): Payer: Medicaid Other | Admitting: Physician Assistant

## 2018-03-17 ENCOUNTER — Ambulatory Visit (HOSPITAL_COMMUNITY)
Admission: RE | Admit: 2018-03-17 | Discharge: 2018-03-17 | Disposition: A | Discharge status: Home / Self Care | Payer: Medicaid Other | Source: Ambulatory Visit | Attending: Family | Admitting: Family

## 2018-03-17 VITALS — BP 140/91 | HR 43 | Resp 18 | Ht 64.0 in | Wt 265.0 lb

## 2018-03-17 DIAGNOSIS — I82402 Acute embolism and thrombosis of unspecified deep veins of left lower extremity: Secondary | ICD-10-CM

## 2018-03-17 MED ORDER — SULFAMETHOXAZOLE-TRIMETHOPRIM 400-80 MG PO TABS: 1.0000 | ORAL_TABLET | Freq: Two times a day (BID) | ORAL | 0 refills | Status: DC

## 2018-04-30 ENCOUNTER — Encounter: Payer: Self-pay | Admitting: Internal Medicine

## 2018-05-23 IMAGING — DX DG CHEST 2V
2 series · 2 of 2 positions shown · non-contrast
Comparison: None.

CLINICAL DATA: Patient with mid chest pain after lifting a box.

EXAM:
CHEST  2 VIEW

[chest pa]
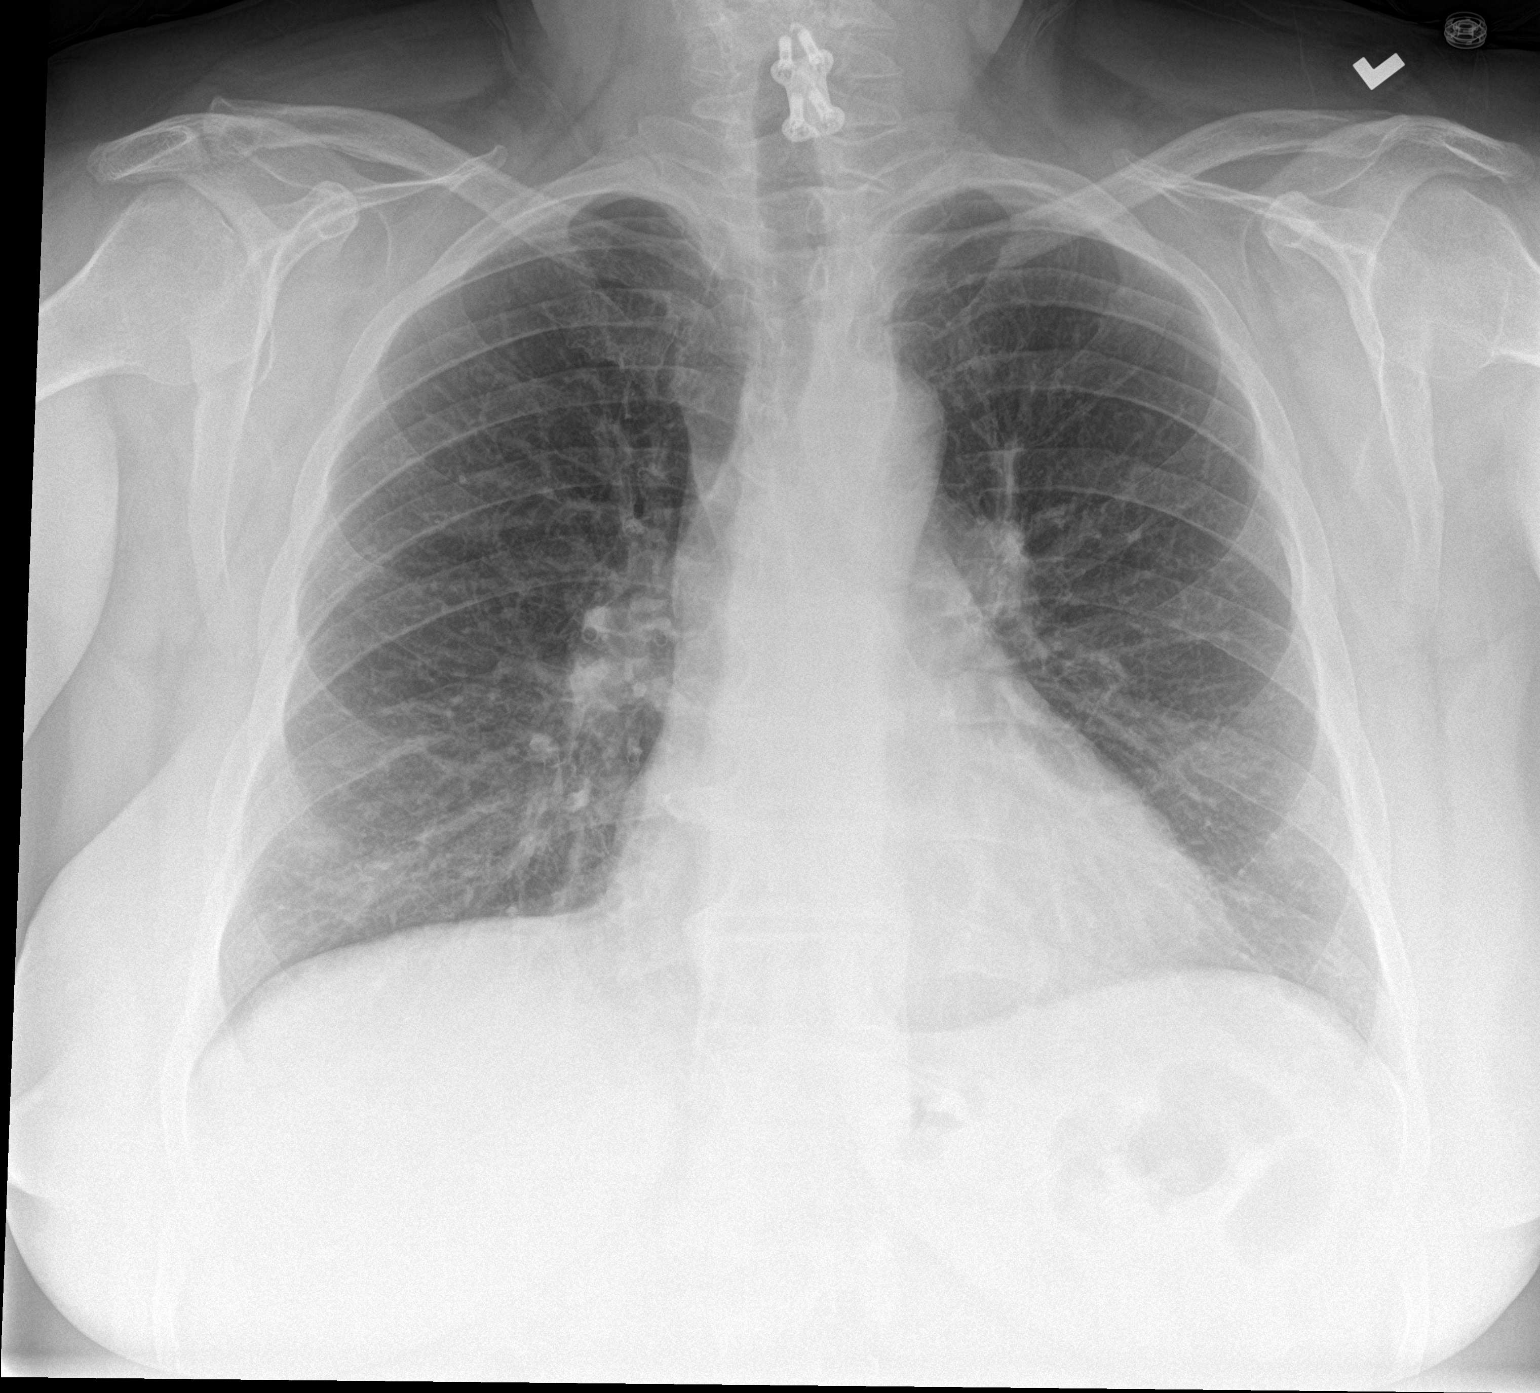

[chest lat]
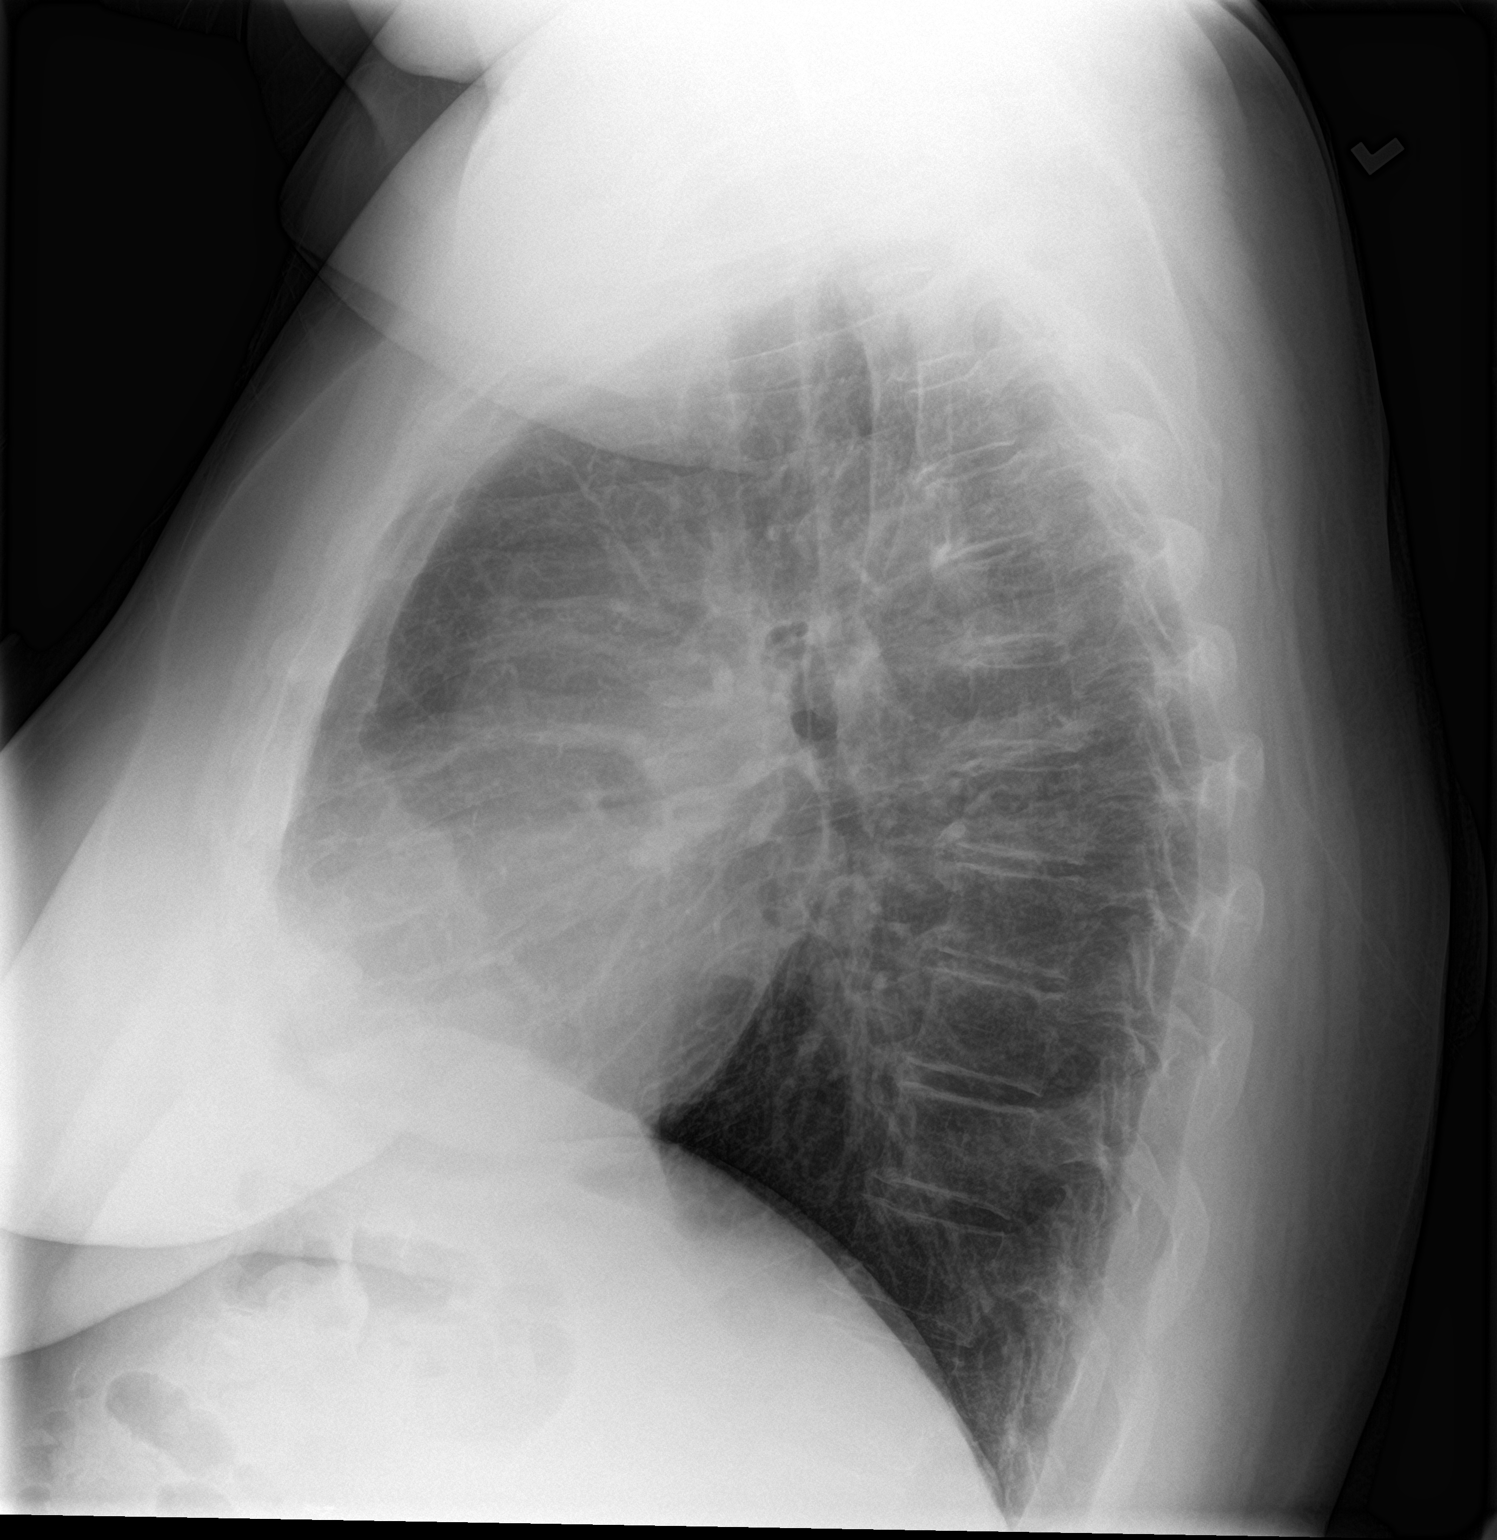

[2 of 2 positions shown; findings below may reference images not displayed]

FINDINGS: Anterior cervical spinal fusion hardware. Normal cardiac and
mediastinal contours. No consolidative pulmonary opacities. No
pleural effusion or pneumothorax. Thoracic spine degenerative
changes.
IMPRESSION: No active cardiopulmonary disease.

## 2018-08-03 ENCOUNTER — Other Ambulatory Visit: Payer: Self-pay | Admitting: Vascular Surgery

## 2018-08-11 ENCOUNTER — Ambulatory Visit: Payer: Medicaid Other | Admitting: Gastroenterology

## 2018-08-19 ENCOUNTER — Other Ambulatory Visit (HOSPITAL_BASED_OUTPATIENT_CLINIC_OR_DEPARTMENT_OTHER): Payer: Self-pay

## 2018-08-19 DIAGNOSIS — R5383 Other fatigue: Secondary | ICD-10-CM

## 2018-08-19 DIAGNOSIS — G4730 Sleep apnea, unspecified: Secondary | ICD-10-CM

## 2018-08-28 ENCOUNTER — Other Ambulatory Visit: Payer: Self-pay

## 2018-08-28 ENCOUNTER — Ambulatory Visit: Payer: Medicaid Other | Attending: Internal Medicine | Admitting: Neurology

## 2018-08-28 DIAGNOSIS — Z79899 Other long term (current) drug therapy: Secondary | ICD-10-CM | POA: Diagnosis not present

## 2018-08-28 DIAGNOSIS — Z7901 Long term (current) use of anticoagulants: Secondary | ICD-10-CM | POA: Diagnosis not present

## 2018-08-28 DIAGNOSIS — Z7984 Long term (current) use of oral hypoglycemic drugs: Secondary | ICD-10-CM | POA: Insufficient documentation

## 2018-08-28 DIAGNOSIS — G4733 Obstructive sleep apnea (adult) (pediatric): Secondary | ICD-10-CM | POA: Diagnosis not present

## 2018-08-28 DIAGNOSIS — Z7982 Long term (current) use of aspirin: Secondary | ICD-10-CM | POA: Insufficient documentation

## 2018-08-28 DIAGNOSIS — R5383 Other fatigue: Secondary | ICD-10-CM | POA: Diagnosis present

## 2018-08-28 DIAGNOSIS — G4730 Sleep apnea, unspecified: Secondary | ICD-10-CM

## 2018-09-03 NOTE — Procedures (Signed)
[place]Witt A. Merlene Laughter, MD     [email or web address]             NOCTURNAL POLYSOMNOGRAPHY   LOCATION: [facility]   Patient Name: Amy Tran, Amy Tran Date: 08/28/2018 Gender: Female D.O.B: [birth year] Age (years): 63 Referring Provider: Asencion Noble Height (inches): 64 Interpreting Physician: Phillips Odor MD, ABSM Weight (lbs): 265 RPSGT: Peak, Robert BMI: 45 MRN: [id number] Neck Size: 16.00 CLINICAL INFORMATION Sleep Study Type: NPSG     Indication for sleep study: Fatigue     Epworth Sleepiness Score:     SLEEP STUDY TECHNIQUE As per the AASM Manual for the Scoring of Sleep and Associated Events v2.3 (April 2016) with a hypopnea requiring 4% desaturations.  The channels recorded and monitored were frontal, central and occipital EEG, electrooculogram (EOG), submentalis EMG (chin), nasal and oral airflow, thoracic and abdominal wall motion, anterior tibialis EMG, snore microphone, electrocardiogram, and pulse oximetry.  MEDICATIONS  Current Outpatient Medications:  .  amLODipine (NORVASC) 5 MG tablet, Take 5 mg by mouth daily., Disp: , Rfl:  .  aspirin EC 81 MG tablet, Take 81 mg by mouth daily., Disp: , Rfl:  .  canagliflozin (INVOKANA) 300 MG TABS tablet, Take 300 mg by mouth daily before breakfast., Disp: , Rfl:  .  CHANTIX CONTINUING MONTH PAK 1 MG tablet, See admin instructions., Disp: , Rfl:  .  glipiZIDE (GLUCOTROL) 5 MG tablet, Take 5 mg by mouth daily before breakfast. , Disp: , Rfl:  .  metFORMIN (GLUCOPHAGE) 1000 MG tablet, Take 1,000 mg by mouth daily with breakfast. , Disp: , Rfl:  .  pantoprazole (PROTONIX) 40 MG tablet, Take 40 mg by mouth daily., Disp: , Rfl:  .  potassium chloride SA (K-DUR,KLOR-CON) 20 MEQ tablet, Take 1 tablet (20 mEq total) by mouth daily., Disp: , Rfl:  .  PROAIR HFA 108 (90 Base) MCG/ACT inhaler, INHALE 2 PUFFS INTO THE LUNGS EVERY 6 (SIX) HOURS AS NEEDED FOR WHEEZING OR SHORTNESS OF BREATH., Disp: 8.5  Inhaler, Rfl: 0 .  Rivaroxaban 15 & 20 MG TBPK, Take as directed on package: Start with one 15mg  tablet by mouth twice a day with food. On Day 22, switch to one 20mg  tablet once a day with food., Disp: 51 each, Rfl: 3 .  simvastatin (ZOCOR) 20 MG tablet, Take 20 mg by mouth at bedtime. , Disp: , Rfl:  .  spironolactone (ALDACTONE) 25 MG tablet, Take 25 mg by mouth daily., Disp: , Rfl:  .  sulfamethoxazole-trimethoprim (BACTRIM,SEPTRA) 400-80 MG tablet, Take 1 tablet by mouth 2 (two) times daily., Disp: 10 tablet, Rfl: 0 .  torsemide (DEMADEX) 20 MG tablet, TAKE 2 TABS IN AM AND 1 TAB IN PM (Patient taking differently: Take 60 mg by mouth daily. ), Disp: 90 tablet, Rfl: 3 .  XARELTO 20 MG TABS tablet, TAKE 1 TABLET (20 MG TOTAL) BY MOUTH DAILY WITH SUPPER., Disp: 90 tablet, Rfl: 1      SLEEP ARCHITECTURE The study was initiated at 10:15:08 PM and ended at 4:40:05 AM.  Sleep onset time was 34.9 minutes and the sleep efficiency was 87.5%%. The total sleep time was 337 minutes.  Stage REM latency was 40.0 minutes.  The patient spent 2.8%% of the night in stage N1 sleep, 62.6%% in stage N2 sleep, 14.5%% in stage N3 and 20% in REM.  Alpha intrusion was absent.  Supine sleep was 12.84%.  RESPIRATORY PARAMETERS The overall apnea/hypopnea index (AHI) was 6.4 per hour. There were 1 total apneas, including  1 obstructive, 0 central and 0 mixed apneas. There were 35 hypopneas and 29 RERAs.  The AHI during Stage REM sleep was 21.3 per hour.  AHI while supine was 6.9 per hour.  The mean oxygen saturation was 91.8%. The minimum SpO2 during sleep was 85.0%.  moderate snoring was noted during this study.  CARDIAC DATA The 2 lead EKG demonstrated sinus rhythm. The mean heart rate was 44.4 beats per minute. Other EKG findings include: PVCs. LEG MOVEMENT DATA The total PLMS were 0 with a resulting PLMS index of 0.0. Associated arousal with leg movement index was 0.0.  IMPRESSIONS Mild obstructive  sleep apnea is documented with this recording.  The severity does not require positive pressure treatment.  Delano Metz, MD Diplomate, American Board of Sleep Medicine. ELECTRONICALLY SIGNED ON:  09/03/2018, 6:28 PM [place] PH: [phone]   FX: [phone] [place]

## 2018-09-04 ENCOUNTER — Other Ambulatory Visit: Payer: Self-pay

## 2018-09-04 DIAGNOSIS — I82422 Acute embolism and thrombosis of left iliac vein: Secondary | ICD-10-CM

## 2018-09-15 ENCOUNTER — Ambulatory Visit: Payer: Medicaid Other | Admitting: Family

## 2018-09-15 ENCOUNTER — Encounter (HOSPITAL_COMMUNITY): Payer: Medicaid Other

## 2018-09-17 ENCOUNTER — Encounter: Payer: Self-pay | Admitting: Vascular Surgery

## 2018-09-17 ENCOUNTER — Ambulatory Visit (HOSPITAL_COMMUNITY)
Admission: RE | Admit: 2018-09-17 | Discharge: 2018-09-17 | Disposition: A | Discharge status: Home / Self Care | Payer: Medicaid Other | Source: Ambulatory Visit | Attending: Family | Admitting: Family

## 2018-09-17 ENCOUNTER — Other Ambulatory Visit: Payer: Self-pay

## 2018-09-17 ENCOUNTER — Ambulatory Visit (INDEPENDENT_AMBULATORY_CARE_PROVIDER_SITE_OTHER): Payer: Medicaid Other | Admitting: Vascular Surgery

## 2018-09-17 VITALS — BP 138/74 | HR 50 | Temp 97.6°F | Resp 20 | Ht 64.0 in | Wt 271.0 lb

## 2018-09-17 DIAGNOSIS — I82422 Acute embolism and thrombosis of left iliac vein: Secondary | ICD-10-CM | POA: Insufficient documentation

## 2018-09-17 DIAGNOSIS — I87009 Postthrombotic syndrome without complications of unspecified extremity: Secondary | ICD-10-CM

## 2018-09-17 MED ORDER — RIVAROXABAN 20 MG PO TABS: 20.0000 mg | ORAL_TABLET | Freq: Every day | ORAL | 3 refills | Status: DC

## 2018-09-17 NOTE — Progress Notes (Signed)
Patient name: Amy Tran MRN: [id number] DOB: 29-Dec-1955 Sex: female   HPI: Amy Tran is a 63 y.o. female, status post left iliac vein stent February 2020.  Was done for extensive DVT.  Patient has really not had any significant improvement in her left leg swelling symptoms.  She currently is on Xarelto.  She has not really been able to wear her compression garment due to the size of her leg.  States the leg swelling does improve a little bit after resting and elevating overnight.  Other medical problems include diabetes, elevated cholesterol, hypertension all of which are currently stable  Past Medical History:  Diagnosis Date  . Arthritis   . Asthma   . Cancer [facility])    multiple skin cancers  . Diabetes mellitus without complication ([place])   . Diverticulosis   . Fluid retention   . Heart murmur   . High cholesterol   . Hypertension   . Incomplete rotator cuff tear   . PONV (postoperative nausea and vomiting)   . Wears glasses   . Wears partial dentures    bottom   Past Surgical History:  Procedure Laterality Date  . APPENDECTOMY    . CARPAL TUNNEL RELEASE Bilateral 2001   bilateral  . CERVICAL POLYPECTOMY  04/08/2017   Procedure: POLYPECTOMY;  Surgeon: [name], MD;  Location: [facility];  Service: Gynecology;;  Endometrial  . CHOLECYSTECTOMY    . COLONOSCOPY  2007   [id number] internal hemorrhoids. Diminutive rectal polyp at 10 cm, cold biopsied/removed. The remainder of the rectal mucosa appeared normal Swallow left-sided diverticula. Diminutive polyp at the splenic flexure cold biopsied/removed (adenomatous)  . COLONOSCOPY N/A 12/28/2013   Procedure: COLONOSCOPY;  Surgeon: Daneil Dolin, MD;  [facility];  Service: Endoscopy;  Laterality: N/A;  730 - moved to 8:30 - Ginger notified pt  . DILATION AND CURETTAGE, DIAGNOSTIC / THERAPEUTIC  03/07/2017  . ERCP  2002  . GASTRIC BYPASS  2004  . HYSTEROSCOPY W/D&C N/A 04/08/2017   Procedure:  DILATATION AND CURETTAGE /HYSTEROSCOPY;  Surgeon: [name], MD;  Location: [facility];  Service: Gynecology;  Laterality: N/A;  . INTRAVASCULAR ULTRASOUND/IVUS N/A 09/12/2017   Procedure: INTRAVASCULAR ULTRASOUND/IVUS;  Surgeon: Elam Dutch, MD;  Location: [place] [facility];  Service: Cardiovascular;  Laterality: N/A;  . NECK SURGERY  1998   fusion  . PERCUTANEOUS VENOUS THROMBECTOMY,LYSIS WITH INTRAVASCULAR ULTRASOUND (IVUS) Left 09/13/2017   Procedure: REMOVAL OF LYSIS CATHETER AND INTRAVASCULAR ULTRASOUND (IVUS) LEFT ILIAC VEIN;  Surgeon: Elam Dutch, MD;  Location: [place];  Service: Vascular;  Laterality: Left;  . PERIPHERAL VASCULAR INTERVENTION  09/15/2017   Procedure: PERIPHERAL VASCULAR INTERVENTION;  Surgeon: Waynetta Sandy, MD;  Location: [place] [facility];  Service: Cardiovascular;;  VEINOUS/STENT  . PERIPHERAL VASCULAR THROMBECTOMY Left 09/12/2017   Procedure: PERIPHERAL VASCULAR THROMBECTOMY;  Surgeon: Elam Dutch, MD;  Location: [place] [facility];  Service: Cardiovascular;  Laterality: Left;  . PERIPHERAL VASCULAR THROMBECTOMY Left 09/15/2017   Procedure: PERIPHERAL VASCULAR THROMBECTOMY;  Surgeon: Waynetta Sandy, MD;  Location: [place] [facility];  Service: Cardiovascular;  Laterality: Left;  IVC TO LT FEM/POP VEIN  . SHOULDER ARTHROSCOPY WITH SUBACROMIAL DECOMPRESSION Left 06/23/2013   Procedure: LEFT SHOULDER ARTHROSCOPY WITH DEBRIDEMENT ROTATOR CUFF AND LABRUM;  Surgeon: Lorn Junes, MD;  Location: [place];  Service: Orthopedics;  Laterality: Left;  . [place]   left    Family  History  Problem Relation Age of Onset  . Diabetes Father   . Hypertension Father   . Parkinson's disease Father   . Cancer Father        not sure what kind  . Hypertension Mother   . Heart disease Mother   . Drug abuse Sister   . Mesothelioma Brother   . Cancer Daughter        non-hodgkins lmphoma  . Hypertension Brother    . Diabetes Brother   . Colon cancer Neg Hx   . Gastric cancer Neg Hx   . Esophageal cancer Neg Hx     SOCIAL HISTORY: Social History   Socioeconomic History  . Marital status: Widowed    Spouse name: Not on file  . Number of children: 2  . Years of education: Not on file  . Highest education level: Not on file  Occupational History  . Occupation: works at [employer]: [place]  . Financial resource strain: Not on file  . Food insecurity    Worry: Not on file    Inability: Not on file  . Transportation needs    Medical: Not on file    Non-medical: Not on file  Tobacco Use  . Smoking status: Current Every Day Smoker    Packs/day: 0.50    Years: 29.00    Pack years: 14.50    Types: Cigarettes    Start date: 04/09/1970  . Smokeless tobacco: Never Used  Substance and Sexual Activity  . Alcohol use: No  . Drug use: No  . Sexual activity: Not Currently    Birth control/protection: Post-menopausal  Lifestyle  . Physical activity    Days per week: Patient refused    Minutes per session: Patient refused  . Stress: Patient refused  Relationships  . Social Herbalist on phone: Patient refused    Gets together: Patient refused    Attends religious service: Patient refused    Active member of club or organization: Patient refused    Attends meetings of clubs or organizations: Patient refused    Relationship status: Patient refused  . Intimate partner violence    Fear of current or ex partner: Patient refused    Emotionally abused: Patient refused    Physically abused: Patient refused    Forced sexual activity: Patient refused  Other Topics Concern  . Not on file  Social History Narrative  . Not on file    Allergies  Allergen Reactions  . Penicillins Hives and Rash    Has patient had a PCN reaction causing immediate rash, facial/tongue/throat swelling, SOB or lightheadedness with hypotension: Yes Has patient had a PCN reaction causing  severe rash involving mucus membranes or skin necrosis: No Has patient had a PCN reaction that required hospitalization No Has patient had a PCN reaction occurring within the last 10 years: No If all of the above answers are "NO", then may proceed with Cephalosporin use.     Current Outpatient Medications  Medication Sig Dispense Refill  . amLODipine (NORVASC) 5 MG tablet Take 5 mg by mouth daily.    Marland Kitchen aspirin EC 81 MG tablet Take 81 mg by mouth daily.    . BD PEN NEEDLE NANO U/F 32G X 4 MM MISC USE FOR VICTOZA INJECTIONS    . canagliflozin (INVOKANA) 300 MG TABS tablet Take 300 mg by mouth daily before breakfast.    . CHANTIX CONTINUING MONTH PAK 1 MG tablet  See admin instructions.    . fluticasone (FLONASE) 50 MCG/ACT nasal spray Place 2 sprays into both nostrils daily.    Marland Kitchen glipiZIDE (GLUCOTROL) 5 MG tablet Take 5 mg by mouth daily before breakfast.     . levocetirizine (XYZAL) 5 MG tablet Take 5 mg by mouth daily.    . metFORMIN (GLUCOPHAGE) 1000 MG tablet Take 1,000 mg by mouth daily with breakfast.     . pantoprazole (PROTONIX) 40 MG tablet Take 40 mg by mouth daily.    . potassium chloride SA (K-DUR,KLOR-CON) 20 MEQ tablet Take 1 tablet (20 mEq total) by mouth daily.    Marland Kitchen PROAIR HFA 108 (90 Base) MCG/ACT inhaler INHALE 2 PUFFS INTO THE LUNGS EVERY 6 (SIX) HOURS AS NEEDED FOR WHEEZING OR SHORTNESS OF BREATH. 8.5 Inhaler 0  . simvastatin (ZOCOR) 20 MG tablet Take 20 mg by mouth at bedtime.     Marland Kitchen spironolactone (ALDACTONE) 25 MG tablet Take 25 mg by mouth daily.    Marland Kitchen torsemide (DEMADEX) 20 MG tablet TAKE 2 TABS IN AM AND 1 TAB IN PM (Patient taking differently: Take 60 mg by mouth daily. ) 90 tablet 3  . VICTOZA 18 MG/3ML SOPN INJECT 0.6MG  DAILY X 2 WEEKS, THEN 1.2MG  DAILY    . XARELTO 20 MG TABS tablet TAKE 1 TABLET (20 MG TOTAL) BY MOUTH DAILY WITH SUPPER. 90 tablet 1   No current facility-administered medications for this visit.     ROS:   General:  No weight loss, Fever,  chills  Cardiac: No recent episodes of chest pain/pressure, no shortness of breath at rest.  No shortness of breath with exertion.  Denies history of atrial fibrillation or irregular heartbeat  Pulmonary: No home oxygen, no productive cough, no hemoptysis,  No asthma or wheezing    Physical Examination  Vitals:   09/17/18 0932  BP: 138/74  Pulse: (!) 50  Resp: 20  Temp: 97.6 F (36.4 C)  SpO2: 96%  Weight: 271 lb (122.9 kg)  Height: 5\' 4"  (1.626 m)    Body mass index is 46.52 kg/m.  General:  Alert and oriented, no acute distress HEENT: Normal Skin: No rash, no ulcer Musculoskeletal: Left leg edema primarily from the knee into the foot with some pitting       Neurologic: Upper and lower extremity motor 5/5 and symmetric  DATA:  Patient had a caval iliac duplex exam today.  Stent is widely patent with no evidence of thrombus.  ASSESSMENT: Postphlebitic syndrome left lower extremity despite patent left iliac venous stent.   PLAN: Patient was counseled today and try to get as much compression on her left lower extremity as possible.  Until she can fit a better compression garment she should put Ace wraps extending from the foot all the way to the knee level.  I also discussed with her that most likely the left leg is going to remain chronically swollen at this point.  No other venous interventions at this point will help with her leg swelling.  She should continue her Xarelto for 1 more year.  She will follow-up in our APP clinic at that one-year office visit.  If she has no difficulties with her iliac stent at that time I believe we could discontinue this and go with aspirin alone.  If she had any bleeding complications between now and then I would have a very low threshold to stop her Xarelto.  She was given a prescription of her Xarelto today for a one-year  supply.   [name], MD [facility] Office: [phone] Pager:  [phone]

## 2018-09-21 NOTE — Patient Instructions (Addendum)
We will get you scheduled for colonoscopy in the near future with Dr. Gala Romney. We will touch base with vascular to ensure it is ok to hold your Xarelto 48 hours prior to the procedure.   Medications the day before the procedure:    Invokana 150 mg, glipizide 2.5mg , Metformin 500 mg, half victoza injection.   Medications day before the procedure.    Hold diabetes medications the morning of the procedure.   We will see you back after your procedure.   Aliene Altes, PA-C [facility]

## 2018-09-21 NOTE — Progress Notes (Signed)
Referring Provider: Asencion Noble, MD Primary Care Physician:  Asencion Noble, MD Primary GI Physician: Dr. Gala Romney  Chief Complaint  Patient presents with  . Gastroesophageal Reflux    doing ok    HPI:   Amy Tran is a 63 y.o. female presenting today with a GI history significant for GERD and adenomatous colonic polyps. Last colonoscopy November 2015 with diverticulosis, no polyps. Due for surveillance in November 2020. Other significant history includes left leg DVT s/p left common and external iliac artery stenting and thrombolysis of left leg DVT in August 2019. She was placed on Xarelto and expected to be on this for at least 1 year then start to aspirin.   Last seen in our office on 06/05/17 for follow-up of GERD and abdominal pain. At that time, from a GI standpoint she was doing well. GERD well controlled and epigastric pain resolved. Had some residual dyspnea due to recent pneumonia. Recommended she continue Protonix 40 mg daily.   Today she states she is doing well with no acute complaints. No GERD breakthrough with Protonix daily. No N/V. No dysphagia. No abdominal pain. Appetite is good. No unintentional weight loss. BM daily. Sometimes 2-3 times a day. Soft and formed. No constipation or diarrhea. No straining. No hematochezia or melena. She continues to have lower extremity edema, greater in the left leg. Occasional pain. States her last 2 toes on left foot are numb. Patient saw vascular on 09/17/18. Swelling in her left lower extremity continues. Dr. [name] stated he suspects this is going to remain swollen chronically and had no plans for further interventions. Plans for Xarelto 1 more year.   No NSAIDs.   Past Medical History:  Diagnosis Date  . Arthritis   . Asthma   . Cancer [facility])    multiple skin cancers  . Diabetes mellitus without complication ([place])   . Diverticulosis   . Fluid retention   . GERD (gastroesophageal reflux disease)   . Heart murmur   . High  cholesterol   . Hypertension   . Incomplete rotator cuff tear   . OSA (obstructive sleep apnea)   . PONV (postoperative nausea and vomiting)   . Wears glasses   . Wears partial dentures    bottom    Past Surgical History:  Procedure Laterality Date  . APPENDECTOMY    . CARPAL TUNNEL RELEASE Bilateral 2001   bilateral  . CERVICAL POLYPECTOMY  04/08/2017   Procedure: POLYPECTOMY;  Surgeon: [name], MD;  [facility];  Service: Gynecology;;  Endometrial  . CHOLECYSTECTOMY    . COLONOSCOPY  2007   [id number] internal hemorrhoids. Diminutive rectal polyp at 10 cm, cold biopsied/removed. The remainder of the rectal mucosa appeared normal Swallow left-sided diverticula. Diminutive polyp at the splenic flexure cold biopsied/removed (adenomatous)  . COLONOSCOPY N/A 12/28/2013   Procedure: COLONOSCOPY;  Surgeon: Daneil Dolin, MD;  [facility];  Service: Endoscopy;  Laterality: N/A;  730 - moved to 8:30 - Ginger notified pt  . DILATION AND CURETTAGE, DIAGNOSTIC / THERAPEUTIC  03/07/2017  . ERCP  2002  . GASTRIC BYPASS  2004  . HYSTEROSCOPY W/D&C N/A 04/08/2017   Procedure: DILATATION AND CURETTAGE /HYSTEROSCOPY;  Surgeon: [name], MD;  Location: [facility];  Service: Gynecology;  Laterality: N/A;  . INTRAVASCULAR ULTRASOUND/IVUS N/A 09/12/2017   Procedure: INTRAVASCULAR ULTRASOUND/IVUS;  Surgeon: Elam Dutch, MD;  Location: [place] [facility];  Service: Cardiovascular;  Laterality: N/A;  . NECK  SURGERY  1998   fusion  . PERCUTANEOUS VENOUS THROMBECTOMY,LYSIS WITH INTRAVASCULAR ULTRASOUND (IVUS) Left 09/13/2017   Procedure: REMOVAL OF LYSIS CATHETER AND INTRAVASCULAR ULTRASOUND (IVUS) LEFT ILIAC VEIN;  Surgeon: Elam Dutch, MD;  Location: [place];  Service: Vascular;  Laterality: Left;  . PERIPHERAL VASCULAR INTERVENTION  09/15/2017   Procedure: PERIPHERAL VASCULAR INTERVENTION;  Surgeon: Waynetta Sandy, MD;  Location: [place] [facility];  Service:  Cardiovascular;;  VEINOUS/STENT  . PERIPHERAL VASCULAR THROMBECTOMY Left 09/12/2017   Procedure: PERIPHERAL VASCULAR THROMBECTOMY;  Surgeon: Elam Dutch, MD;  Location: [place] [facility];  Service: Cardiovascular;  Laterality: Left;  . PERIPHERAL VASCULAR THROMBECTOMY Left 09/15/2017   Procedure: PERIPHERAL VASCULAR THROMBECTOMY;  Surgeon: Waynetta Sandy, MD;  Location: [place] [facility];  Service: Cardiovascular;  Laterality: Left;  IVC TO LT FEM/POP VEIN  . SHOULDER ARTHROSCOPY WITH SUBACROMIAL DECOMPRESSION Left 06/23/2013   Procedure: LEFT SHOULDER ARTHROSCOPY WITH DEBRIDEMENT ROTATOR CUFF AND LABRUM;  Surgeon: Lorn Junes, MD;  Location: [place];  Service: Orthopedics;  Laterality: Left;  . SHOULDER SURGERY  1999   left    Current Outpatient Medications  Medication Sig Dispense Refill  . amLODipine (NORVASC) 5 MG tablet Take 5 mg by mouth daily.    Marland Kitchen aspirin EC 81 MG tablet Take 81 mg by mouth daily.    . BD PEN NEEDLE NANO U/F 32G X 4 MM MISC USE FOR VICTOZA INJECTIONS    . canagliflozin (INVOKANA) 300 MG TABS tablet Take 300 mg by mouth daily before breakfast.    . fluticasone (FLONASE) 50 MCG/ACT nasal spray Place 2 sprays into both nostrils as needed.     Marland Kitchen glipiZIDE (GLUCOTROL) 5 MG tablet Take 5 mg by mouth daily before breakfast.     . levocetirizine (XYZAL) 5 MG tablet Take 5 mg by mouth daily.    . metFORMIN (GLUCOPHAGE) 1000 MG tablet Take 1,000 mg by mouth daily with breakfast.     . pantoprazole (PROTONIX) 40 MG tablet Take 40 mg by mouth daily.    . potassium chloride SA (K-DUR,KLOR-CON) 20 MEQ tablet Take 1 tablet (20 mEq total) by mouth daily.    Marland Kitchen PROAIR HFA 108 (90 Base) MCG/ACT inhaler INHALE 2 PUFFS INTO THE LUNGS EVERY 6 (SIX) HOURS AS NEEDED FOR WHEEZING OR SHORTNESS OF BREATH. 8.5 Inhaler 0  . rivaroxaban (XARELTO) 20 MG TABS tablet Take 1 tablet (20 mg total) by mouth daily with supper. 90 tablet 3  . simvastatin (ZOCOR) 20 MG  tablet Take 20 mg by mouth at bedtime.     Marland Kitchen spironolactone (ALDACTONE) 25 MG tablet Take 25 mg by mouth daily.    Marland Kitchen torsemide (DEMADEX) 20 MG tablet TAKE 2 TABS IN AM AND 1 TAB IN PM (Patient taking differently: Take 60 mg by mouth daily. ) 90 tablet 3  . VICTOZA 18 MG/3ML SOPN Taking 1.2mg  daily     No current facility-administered medications for this visit.     Allergies as of 09/22/2018 - Review Complete 09/22/2018  Allergen Reaction Noted  . Penicillins Hives and Rash 04/04/2013    Family History  Problem Relation Age of Onset  . Diabetes Father   . Hypertension Father   . Parkinson's disease Father   . Cancer Father        not sure what kind  . Hypertension Mother   . Heart disease Mother   . Drug abuse Sister   . Mesothelioma Brother   .  Cancer Daughter        non-hodgkins lmphoma  . Hypertension Brother   . Diabetes Brother   . Colon cancer Neg Hx   . Gastric cancer Neg Hx   . Esophageal cancer Neg Hx     Social History   Socioeconomic History  . Marital status: Widowed    Spouse name: Not on file  . Number of children: 2  . Years of education: Not on file  . Highest education level: Not on file  Occupational History  . Occupation: works at [employer]: [place]  . Financial resource strain: Not on file  . Food insecurity    Worry: Not on file    Inability: Not on file  . Transportation needs    Medical: Not on file    Non-medical: Not on file  Tobacco Use  . Smoking status: Current Every Day Smoker    Packs/day: 0.50    Years: 29.00    Pack years: 14.50    Types: Cigarettes    Start date: 04/09/1970  . Smokeless tobacco: Never Used  Substance and Sexual Activity  . Alcohol use: Yes    Comment: occ beer  . Drug use: No  . Sexual activity: Not Currently    Birth control/protection: Post-menopausal  Lifestyle  . Physical activity    Days per week: Patient refused    Minutes per session: Patient refused  . Stress: Patient  refused  Relationships  . Social Herbalist on phone: Patient refused    Gets together: Patient refused    Attends religious service: Patient refused    Active member of club or organization: Patient refused    Attends meetings of clubs or organizations: Patient refused    Relationship status: Patient refused  Other Topics Concern  . Not on file  Social History Narrative  . Not on file    Review of Systems: Gen: Denies fever, chills, fatigue, lightheadedness, dizziness, pre-syncope.  CV: Denies chest pain, palpitations, syncope. Resp: Denies dyspnea at rest, cough, wheezing GI: See HPI Psych: Denies depression, anxiety Heme: Denies bruising, bleeding  Physical Exam: BP (!) 163/82   Pulse (!) 54   Temp (!) 96.8 F (36 C) (Temporal)   Ht 5\' 4"  (1.626 m)   Wt 284 lb 12.8 oz (129.2 kg)   BMI 48.89 kg/m  General:   Alert and oriented. No distress noted. Pleasant and cooperative.  Head:  Normocephalic and atraumatic. Eyes:  Conjuctiva clear without scleral icterus. Heart:  S1, S2 present without murmurs appreciated. Lungs:  Clear to auscultation bilaterally. No wheezes, rales, or rhonchi. No distress.  Abdomen:  +BS, soft, non-tender and non-distended. No rebound or guarding. No HSM or masses noted. Msk:  Symmetrical without gross deformities. Normal posture. Extremities:  Bilateral lower extremity edema. Greater in the left leg. This is chronic since left leg DVT s/p stenting and thrombolysis in August 2019. No significant erythema or warmth to the left leg. Neurologic:  Alert and  oriented x4 Psych: Normal mood and affect.

## 2018-09-22 ENCOUNTER — Ambulatory Visit: Payer: Medicaid Other | Admitting: Gastroenterology

## 2018-09-22 ENCOUNTER — Encounter: Payer: Self-pay | Admitting: Gastroenterology

## 2018-09-22 ENCOUNTER — Other Ambulatory Visit: Payer: Self-pay

## 2018-09-22 ENCOUNTER — Ambulatory Visit (INDEPENDENT_AMBULATORY_CARE_PROVIDER_SITE_OTHER): Payer: Medicaid Other | Admitting: Gastroenterology

## 2018-09-22 VITALS — BP 163/82 | HR 54 | Temp 96.8°F | Ht 64.0 in | Wt 284.8 lb

## 2018-09-22 DIAGNOSIS — K219 Gastro-esophageal reflux disease without esophagitis: Secondary | ICD-10-CM

## 2018-09-22 DIAGNOSIS — Z86010 Personal history of colonic polyps: Secondary | ICD-10-CM | POA: Diagnosis not present

## 2018-09-22 NOTE — Assessment & Plan Note (Signed)
Chronic. Well controlled on Protonix 40 mg daily. Continue Protonix 40 mg daily 30 minutes before breakfast.

## 2018-09-22 NOTE — Assessment & Plan Note (Signed)
63 y.o. female with history of adenomatous colon polyps. Last colonoscopy 2015 with diverticulosis. No polyps. She is due for surveillance at this time. She has no upper or lower GI complaints at this time. No alarm symptoms.  Proceed with TCS with Dr. Gala Romney in the near future with propofol due to BMI and mild obstructive sleep apnea. The risks, benefits, and alternatives have been discussed in detail with patient. They have stated understanding and desire to proceed.  Will request approval to hold Xarelto 48 hours prior to procedure.  Diabetes medication adjustments.   Medications the day before the procedure:    Invokana 150 mg, glipizide 2.5mg , Metformin 500 mg, half victoza injection.   Medications day before the procedure.    Hold diabetes medications the morning of the procedure.  Follow-up after procedure.

## 2018-09-24 ENCOUNTER — Telehealth: Payer: Self-pay

## 2018-09-24 NOTE — Telephone Encounter (Signed)
Dr. [name],   This mutual patient was seen in our office by Aliene Altes, PA on 09/22/2018.  She would like to schedule the patient to have a colonoscopy with Dr. Gala Romney in the very near future.  Please advise if it is OK for pt to hold Xarelto x 48 hours prior to procedure.   Thanks so much!  Everardo All, LPN

## 2018-10-07 NOTE — Telephone Encounter (Signed)
Dr. [name], please advise on holding Xarelto. Thanks so much!

## 2018-10-08 ENCOUNTER — Telehealth: Payer: Self-pay | Admitting: Vascular Surgery

## 2018-10-08 ENCOUNTER — Encounter: Payer: Self-pay | Admitting: Vascular Surgery

## 2018-10-08 NOTE — Telephone Encounter (Signed)
I called the office of Dr. [name] and was told by Kenney Houseman to route the message to Gwynne Edinger.   I also left a VM in the Clinical Team VM to give heads up.  Caryl Pina, please discuss with Dr. Oneida Alar and route message back to me.  Thanks so much!  Waldon Merl, LPN

## 2018-10-08 NOTE — Telephone Encounter (Signed)
Received a fax from Dr. [name] stating pt may stop Xarelto 3 days prior to her procedure.  She should restart as soon as practical post procedure.  Putting paperwork in Kristen's box to review.

## 2018-10-08 NOTE — Telephone Encounter (Signed)
Noted. We will call to schedule when we receive November schedule

## 2018-10-08 NOTE — Telephone Encounter (Signed)
Reviewed. [facility] clinical pool, ok to schedule. Patient only needs to hold Xarelto 48 hours prior to procedure. Dr. Gala Romney will give recommendations on when to restart after the procedure. I should have already provided instructions for diabetes medication adjustments.  Amy Tran

## 2018-10-08 NOTE — Telephone Encounter (Signed)
Letter sent to [employer] Nicole Kindred LPN at [facility] with Dr. Nona Dell response to medication hold prior to procedure.    Thurston Hole., LPN

## 2018-10-13 ENCOUNTER — Telehealth: Payer: Self-pay | Admitting: *Deleted

## 2018-10-13 NOTE — Telephone Encounter (Signed)
LMOVM to schedule tcs with propofol with [facility].

## 2018-10-14 NOTE — Telephone Encounter (Signed)
Letter mailed to call back to schedule 

## 2018-12-01 ENCOUNTER — Telehealth: Payer: Self-pay

## 2018-12-01 ENCOUNTER — Encounter: Payer: Self-pay | Admitting: Internal Medicine

## 2018-12-01 NOTE — Telephone Encounter (Signed)
Pt called to say that she has a knot on her leg that has come up around her calf area and it is causing severe pain. She said that the pain shoots up her leg as well. No injury that she knows of, denies heat to the area. Just states tender and painful.   Appt made for pt to be seen   York Cerise, CMA

## 2018-12-03 ENCOUNTER — Other Ambulatory Visit: Payer: Self-pay

## 2018-12-03 ENCOUNTER — Other Ambulatory Visit (HOSPITAL_COMMUNITY): Payer: Self-pay | Admitting: Family

## 2018-12-03 ENCOUNTER — Ambulatory Visit (HOSPITAL_COMMUNITY)
Admission: RE | Admit: 2018-12-03 | Discharge: 2018-12-03 | Disposition: A | Discharge status: Home / Self Care | Payer: Medicaid Other | Source: Ambulatory Visit | Attending: Family | Admitting: Family

## 2018-12-03 ENCOUNTER — Encounter: Payer: Self-pay | Admitting: Family

## 2018-12-03 ENCOUNTER — Ambulatory Visit (INDEPENDENT_AMBULATORY_CARE_PROVIDER_SITE_OTHER): Payer: Self-pay | Admitting: Family

## 2018-12-03 VITALS — BP 168/84 | HR 55 | Temp 97.7°F | Resp 16 | Ht 64.0 in | Wt 268.0 lb

## 2018-12-03 DIAGNOSIS — I82422 Acute embolism and thrombosis of left iliac vein: Secondary | ICD-10-CM

## 2018-12-03 DIAGNOSIS — M79605 Pain in left leg: Secondary | ICD-10-CM

## 2018-12-03 DIAGNOSIS — L03116 Cellulitis of left lower limb: Secondary | ICD-10-CM

## 2018-12-03 DIAGNOSIS — R609 Edema, unspecified: Secondary | ICD-10-CM

## 2018-12-03 MED ORDER — DOXYCYCLINE HYCLATE 100 MG PO CAPS: 100.0000 mg | ORAL_CAPSULE | Freq: Two times a day (BID) | ORAL | 0 refills | Status: DC

## 2018-12-03 NOTE — Patient Instructions (Addendum)
To decrease swelling in your feet and legs: Elevate feet above slightly bent knees, feet above heart, overnight and 3-4 times per day for 20 minutes.    Cellulitis, Adult  Cellulitis is a skin infection. The infected area is often warm, red, swollen, and sore. It occurs most often in the arms and lower legs. It is very important to get treated for this condition. What are the causes? This condition is caused by bacteria. The bacteria enter through a break in the skin, such as a cut, burn, insect bite, open sore, or crack. What increases the risk? This condition is more likely to occur in people who:  Have a weak body defense system (immune system).  Have open cuts, burns, bites, or scrapes on the skin.  Are older than 63 years of age.  Have a blood sugar problem (diabetes).  Have a long-lasting (chronic) liver disease (cirrhosis) or kidney disease.  Are very overweight (obese).  Have a skin problem, such as: ? Itchy rash (eczema). ? Slow movement of blood in the veins (venous stasis). ? Fluid buildup below the skin (edema).  Have been treated with high-energy rays (radiation).  Use IV drugs. What are the signs or symptoms? Symptoms of this condition include:  Skin that is: ? Red. ? Streaking. ? Spotting. ? Swollen. ? Sore or painful when you touch it. ? Warm.  A fever.  Chills.  Blisters. How is this diagnosed? This condition is diagnosed based on:  Medical history.  Physical exam.  Blood tests.  Imaging tests. How is this treated? Treatment for this condition may include:  Medicines to treat infections or allergies.  Home care, such as: ? Rest. ? Placing cold or warm cloths (compresses) on the skin.  Hospital care, if the condition is very bad. Follow these instructions at home: Medicines  Take over-the-counter and prescription medicines only as told by your doctor.  If you were prescribed an antibiotic medicine, take it as told by your  doctor. Do not stop taking it even if you start to feel better. General instructions   Drink enough fluid to keep your pee (urine) pale yellow.  Do not touch or rub the infected area.  Raise (elevate) the infected area above the level of your heart while you are sitting or lying down.  Place cold or warm cloths on the area as told by your doctor.  Keep all follow-up visits as told by your doctor. This is important. Contact a doctor if:  You have a fever.  You do not start to get better after 1-2 days of treatment.  Your bone or joint under the infected area starts to hurt after the skin has healed.  Your infection comes back. This can happen in the same area or another area.  You have a swollen bump in the area.  You have new symptoms.  You feel ill and have muscle aches and pains. Get help right away if:  Your symptoms get worse.  You feel very sleepy.  You throw up (vomit) or have watery poop (diarrhea) for a long time.  You see red streaks coming from the area.  Your red area gets larger.  Your red area turns dark in color. These symptoms may represent a serious problem that is an emergency. Do not wait to see if the symptoms will go away. Get medical help right away. Call your local emergency services (911 in the [place] Do not drive yourself to the hospital. Summary  Cellulitis is a  skin infection. The area is often warm, red, swollen, and sore.  This condition is treated with medicines, rest, and cold and warm cloths.  Take all medicines only as told by your doctor.  Tell your doctor if symptoms do not start to get better after 1-2 days of treatment. This information is not intended to replace advice given to you by your health care provider. Make sure you discuss any questions you have with your health care provider. Document Released: 07/17/2007 Document Revised: 06/19/2017 Document Reviewed: 06/19/2017 Elsevier Patient Education  2020 [employer].      Edema  Edema is when you have too much fluid in your body or under your skin. Edema may make your legs, feet, and ankles swell up. Swelling is also common in looser tissues, like around your eyes. This is a common condition. It gets more common as you get older. There are many possible causes of edema. Eating too much salt (sodium) and being on your feet or sitting for a long time can cause edema in your legs, feet, and ankles. Hot weather may make edema worse. Edema is usually painless. Your skin may look swollen or shiny. Follow these instructions at home:  Keep the swollen body part raised (elevated) above the level of your heart when you are sitting or lying down.  Do not sit still or stand for a long time.  Do not wear tight clothes. Do not wear garters on your upper legs.  Exercise your legs. This can help the swelling go down.  Wear elastic bandages or support stockings as told by your doctor.  Eat a low-salt (low-sodium) diet to reduce fluid as told by your doctor.  Depending on the cause of your swelling, you may need to limit how much fluid you drink (fluid restriction).  Take over-the-counter and prescription medicines only as told by your doctor. Contact a doctor if:  Treatment is not working.  You have heart, liver, or kidney disease and have symptoms of edema.  You have sudden and unexplained weight gain. Get help right away if:  You have shortness of breath or chest pain.  You cannot breathe when you lie down.  You have pain, redness, or warmth in the swollen areas.  You have heart, liver, or kidney disease and get edema all of a sudden.  You have a fever and your symptoms get worse all of a sudden. Summary  Edema is when you have too much fluid in your body or under your skin.  Edema may make your legs, feet, and ankles swell up. Swelling is also common in looser tissues, like around your eyes.  Raise (elevate) the swollen body part above the level of  your heart when you are sitting or lying down.  Follow your doctor's instructions about diet and how much fluid you can drink (fluid restriction). This information is not intended to replace advice given to you by your health care provider. Make sure you discuss any questions you have with your health care provider. Document Released: 07/17/2007 Document Revised: 01/31/2017 Document Reviewed: 02/16/2016 Elsevier Patient Education  2020 [employer].

## 2018-12-03 NOTE — Progress Notes (Signed)
CC: Pain and swelling left calf x 2-3 weeks, history of DVT   History of Present Illness  Amy Tran is a 63 y.o. (1956/02/04) female who is status post left iliac vein stent February 2020 by Dr. Oneida Alar for extensive DVT.   She then underwent IVIS of left popliteal, left femoral, left common femoral, left external iliac, left common iliac veins and IVC, balloon angioplasty of the left common femoral vein with 10 and 12 mm balloons, stenting of left common and external iliac veins with 14 x 120 mm Vici, and mechanical thrombectomy with Inari clottriever and cleaner wire on 09-16-18 by Dr. 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  Dr. Oneida Alar last evaluated pt on 09-17-18. At that time patient had a caval iliac duplex exam. Stent was widely patent with no evidence of thrombus. Postphlebitic syndrome left lower extremity despite patent left iliac venous stent. Patient was counseled to try to get as much compression on her left lower extremity as possible.  Until she can fit a better compression garment she should put Ace wraps extending from the foot all the way to the knee level. Dr. Oneida Alar also discussed with her that most likely the left leg is going to remain chronically swollen at that point.  No other venous interventions at this point will help with her leg swelling. She was to continue her Xarelto for 1 more year.  She was to follow-up in our [facility] clinic at that one-year office visit.  If she has no difficulties with her iliac stent at that time Dr, Oneida Alar indicated that he believes we could discontinue this and go with aspirin alone.  If she had any bleeding complications between now and then  would have a very low threshold to stop her Xarelto.  She was given a prescription of her Xarelto that day for a one-year supply.  She returns today with c/o "knot" at medial aspect left thigh that she noticed about 2-3 weeks ago, has not worsened, states the swelling in  her left lower leg looks better today than it has in the last few days. She states that she has been wearing ace wraps to her left lower leg and a knee hight compression hose to her right lower leg. She denies dyspnea, but states she has been more tired in the last 2-3 weeks. She denies fever or chills.    Past Medical History:  Diagnosis Date  . Arthritis   . Asthma   . Cancer [facility])    multiple skin cancers  . Diabetes mellitus without complication ([place])   . Diverticulosis   . Fluid retention   . GERD (gastroesophageal reflux disease)   . Heart murmur   . High cholesterol   . Hypertension   . Incomplete rotator cuff tear   . OSA (obstructive sleep apnea)   . PONV (postoperative nausea and vomiting)   . Wears glasses   . Wears partial dentures    bottom    Social History Social History   Tobacco Use  . Smoking status: Current Every Day Smoker    Packs/day: 0.50    Years: 29.00    Pack years: 14.50    Types: Cigarettes    Start date: 04/09/1970  . Smokeless tobacco: Never Used  Substance Use Topics  . Alcohol use: Yes    Comment: occ beer  . Drug use: No    Family History Family History  Problem Relation Age of Onset  .  Diabetes Father   . Hypertension Father   . Parkinson's disease Father   . Cancer Father        not sure what kind  . Hypertension Mother   . Heart disease Mother   . Drug abuse Sister   . Mesothelioma Brother   . Cancer Daughter        non-hodgkins lmphoma  . Hypertension Brother   . Diabetes Brother   . Colon cancer Neg Hx   . Gastric cancer Neg Hx   . Esophageal cancer Neg Hx     Surgical History Past Surgical History:  Procedure Laterality Date  . APPENDECTOMY    . CARPAL TUNNEL RELEASE Bilateral 2001   bilateral  . CERVICAL POLYPECTOMY  04/08/2017   Procedure: POLYPECTOMY;  Surgeon: [name], MD;  Location: [facility];  Service: Gynecology;;  Endometrial  . CHOLECYSTECTOMY    . COLONOSCOPY  2007   [id number] internal  hemorrhoids. Diminutive rectal polyp at 10 cm, cold biopsied/removed. The remainder of the rectal mucosa appeared normal Swallow left-sided diverticula. Diminutive polyp at the splenic flexure cold biopsied/removed (adenomatous)  . COLONOSCOPY N/A 12/28/2013   Procedure: COLONOSCOPY;  Surgeon: Daneil Dolin, MD;  Location: [facility];  Service: Endoscopy;  Laterality: N/A;  730 - moved to 8:30 - Ginger notified pt  . DILATION AND CURETTAGE, DIAGNOSTIC / THERAPEUTIC  03/07/2017  . ERCP  2002  . GASTRIC BYPASS  2004  . HYSTEROSCOPY W/D&C N/A 04/08/2017   Procedure: DILATATION AND CURETTAGE /HYSTEROSCOPY;  Surgeon: [name], MD;  Location: [facility];  Service: Gynecology;  Laterality: N/A;  . INTRAVASCULAR ULTRASOUND/IVUS N/A 09/12/2017   Procedure: INTRAVASCULAR ULTRASOUND/IVUS;  Surgeon: Elam Dutch, MD;  Location: [place] [facility];  Service: Cardiovascular;  Laterality: N/A;  . NECK SURGERY  1998   fusion  . PERCUTANEOUS VENOUS THROMBECTOMY,LYSIS WITH INTRAVASCULAR ULTRASOUND (IVUS) Left 09/13/2017   Procedure: REMOVAL OF LYSIS CATHETER AND INTRAVASCULAR ULTRASOUND (IVUS) LEFT ILIAC VEIN;  Surgeon: Elam Dutch, MD;  Location: [place];  Service: Vascular;  Laterality: Left;  . PERIPHERAL VASCULAR INTERVENTION  09/15/2017   Procedure: PERIPHERAL VASCULAR INTERVENTION;  Surgeon: Waynetta Sandy, MD;  Location: [place] [facility];  Service: Cardiovascular;;  VEINOUS/STENT  . PERIPHERAL VASCULAR THROMBECTOMY Left 09/12/2017   Procedure: PERIPHERAL VASCULAR THROMBECTOMY;  Surgeon: Elam Dutch, MD;  Location: [place] [facility];  Service: Cardiovascular;  Laterality: Left;  . PERIPHERAL VASCULAR THROMBECTOMY Left 09/15/2017   Procedure: PERIPHERAL VASCULAR THROMBECTOMY;  Surgeon: Waynetta Sandy, MD;  Location: [place] [facility];  Service: Cardiovascular;  Laterality: Left;  IVC TO LT FEM/POP VEIN  . SHOULDER ARTHROSCOPY WITH SUBACROMIAL DECOMPRESSION Left 06/23/2013    Procedure: LEFT SHOULDER ARTHROSCOPY WITH DEBRIDEMENT ROTATOR CUFF AND LABRUM;  Surgeon: Lorn Junes, MD;  Location: [place];  Service: Orthopedics;  Laterality: Left;  . SHOULDER SURGERY  1999   left    Allergies  Allergen Reactions  . Penicillins Hives and Rash    Has patient had a PCN reaction causing immediate rash, facial/tongue/throat swelling, SOB or lightheadedness with hypotension: Yes Has patient had a PCN reaction causing severe rash involving mucus membranes or skin necrosis: No Has patient had a PCN reaction that required hospitalization No Has patient had a PCN reaction occurring within the last 10 years: No If all of the above answers are "NO", then may proceed with Cephalosporin use.     Current Outpatient Medications  Medication Sig Dispense  Refill  . amLODipine (NORVASC) 5 MG tablet Take 5 mg by mouth daily.    Marland Kitchen aspirin EC 81 MG tablet Take 81 mg by mouth daily.    . BD PEN NEEDLE NANO U/F 32G X 4 MM MISC USE FOR VICTOZA INJECTIONS    . canagliflozin (INVOKANA) 300 MG TABS tablet Take 300 mg by mouth daily before breakfast.    . fluticasone (FLONASE) 50 MCG/ACT nasal spray Place 2 sprays into both nostrils as needed.     Marland Kitchen glipiZIDE (GLUCOTROL) 5 MG tablet Take 5 mg by mouth daily before breakfast.     . levocetirizine (XYZAL) 5 MG tablet Take 5 mg by mouth daily.    . metFORMIN (GLUCOPHAGE) 1000 MG tablet Take 1,000 mg by mouth daily with breakfast.     . pantoprazole (PROTONIX) 40 MG tablet Take 40 mg by mouth daily.    . potassium chloride SA (K-DUR,KLOR-CON) 20 MEQ tablet Take 1 tablet (20 mEq total) by mouth daily.    Marland Kitchen PROAIR HFA 108 (90 Base) MCG/ACT inhaler INHALE 2 PUFFS INTO THE LUNGS EVERY 6 (SIX) HOURS AS NEEDED FOR WHEEZING OR SHORTNESS OF BREATH. 8.5 Inhaler 0  . rivaroxaban (XARELTO) 20 MG TABS tablet Take 1 tablet (20 mg total) by mouth daily with supper. 90 tablet 3  . simvastatin (ZOCOR) 20 MG tablet Take 20 mg by mouth at  bedtime.     Marland Kitchen spironolactone (ALDACTONE) 25 MG tablet Take 25 mg by mouth daily.    Marland Kitchen torsemide (DEMADEX) 20 MG tablet TAKE 2 TABS IN AM AND 1 TAB IN PM (Patient taking differently: Take 60 mg by mouth daily. ) 90 tablet 3  . VICTOZA 18 MG/3ML SOPN Taking 1.2mg  daily     No current facility-administered medications for this visit.     On ROS today: See HPI for pertinent positives and negatives.  Physical Examination  Vitals:   12/03/18 0911  BP: (!) 168/84  Pulse: (!) 55  Resp: 16  Temp: 97.7 F (36.5 C)  TempSrc: Temporal  SpO2: 93%  Weight: 268 lb (121.6 kg)  Height: 5\' 4"  (1.626 m)   Body mass index is 46 kg/m.  General: A&O x 3, morbidly obese female in NAD. HEENT: Grossly intact and WNL.  Pulmonary: Sym exp, good air movt, CTAB, no rales, rhonchi, or wheezes Cardiac: Regular rate and rhythm, no detected murmur.  Vascular: Vessel Right Left  Radial Palpable Palpable  Carotid  without bruit  without bruit  Aorta Not palpable N/A  Popliteal Not palpable Not palpable  PT Palpable Not Palpable  DP Palpable Faintly Palpable      Gastrointestinal: soft, NTND, -G/R, - HSM, - palpable masses, - CVAT B. Musculoskeletal: M/S 5/5 throughout, Extremities without ischemic changes. Skin: No rash, no ulcers noted. Apparent cellulitis left medial calf, tender to palpation.  Neurologic: Pain and light touch intact in extremities, Motor exam as listed above. Psychiatric: Normal thought content, mood appropriate to clinical situation.     DATA  Left LE Venous Duplex (Date: 12/03/2018):  Right: No evidence of common femoral vein obstruction. Left: There is no evidence of deep vein thrombosis in the lower extremity.There is no evidence of superficial venous thrombosis.    Medical Decision Making  KAMORIE CORY is a 63 y.o. female who presents with: moderate cellulitis left medial calf.  Doxycycline 100 mg po bid x 14 days sent to her pharmacy.  I stressed to her the  importance of elevating her legs above her  heart, avoid prolonged periods of sitting or standing, and the use of compression in her lower legs: ace wrap rewrapped daily left lower leg, and 20-30 mm Hg compression right lower leg. Venous duplex of left leg today was negative for DVT.   She is status post left iliac vein stent February 2020 by Dr. Oneida Alar for extensive DVT.   She then underwent IVIS of left popliteal, left femoral, left common femoral, left external iliac, left common iliac veins and IVC, balloon angioplasty of the left common femoral vein with 10 and 12 mm balloons, stenting of left common and external iliac veins with 14 x 120 mm Vici, and mechanical thrombectomy with Inari clottriever and cleaner wire on 09-16-18 by Dr. 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  I discussed with Dr. Oneida Alar plan of care for pt.  Pt to return in August 2021 with vena caval duplex, see Dr. Oneida Alar. I advised her to notify us if she develops concerns re her legs, and if she develops shortness of breath.  Continue Xarelto.     Clemon Chambers, RN, MSN, FNP-C [facility] Office: [phone]  Clinic MD: Laqueta Due  12/03/2018, 9:18 AM

## 2019-01-03 ENCOUNTER — Other Ambulatory Visit: Payer: Self-pay

## 2019-01-03 ENCOUNTER — Emergency Department (HOSPITAL_COMMUNITY)
Admission: EM | Admit: 2019-01-03 | Discharge: 2019-01-04 | Disposition: A | Discharge status: Home / Self Care | Payer: Medicaid Other | Attending: Emergency Medicine | Admitting: Emergency Medicine

## 2019-01-03 ENCOUNTER — Encounter (HOSPITAL_COMMUNITY): Payer: Self-pay | Admitting: Emergency Medicine

## 2019-01-03 ENCOUNTER — Emergency Department (HOSPITAL_COMMUNITY): Payer: Medicaid Other

## 2019-01-03 DIAGNOSIS — J45909 Unspecified asthma, uncomplicated: Secondary | ICD-10-CM | POA: Insufficient documentation

## 2019-01-03 DIAGNOSIS — R05 Cough: Secondary | ICD-10-CM

## 2019-01-03 DIAGNOSIS — Z7982 Long term (current) use of aspirin: Secondary | ICD-10-CM | POA: Diagnosis not present

## 2019-01-03 DIAGNOSIS — Z7901 Long term (current) use of anticoagulants: Secondary | ICD-10-CM | POA: Insufficient documentation

## 2019-01-03 DIAGNOSIS — Z7984 Long term (current) use of oral hypoglycemic drugs: Secondary | ICD-10-CM | POA: Diagnosis not present

## 2019-01-03 DIAGNOSIS — R059 Cough, unspecified: Secondary | ICD-10-CM

## 2019-01-03 DIAGNOSIS — E119 Type 2 diabetes mellitus without complications: Secondary | ICD-10-CM | POA: Insufficient documentation

## 2019-01-03 DIAGNOSIS — Z20828 Contact with and (suspected) exposure to other viral communicable diseases: Secondary | ICD-10-CM | POA: Insufficient documentation

## 2019-01-03 DIAGNOSIS — Z79899 Other long term (current) drug therapy: Secondary | ICD-10-CM | POA: Insufficient documentation

## 2019-01-03 DIAGNOSIS — I10 Essential (primary) hypertension: Secondary | ICD-10-CM | POA: Insufficient documentation

## 2019-01-03 DIAGNOSIS — R0602 Shortness of breath: Secondary | ICD-10-CM | POA: Diagnosis not present

## 2019-01-03 LAB — CBC WITH DIFFERENTIAL/PLATELET
Abs Immature Granulocytes: 0.06 10*3/uL (ref 0.00–0.07)
Basophils Absolute: 0 10*3/uL (ref 0.0–0.1)
Basophils Relative: 0 %
Eosinophils Absolute: 0.2 10*3/uL (ref 0.0–0.5)
Eosinophils Relative: 2 %
HCT: 39.1 % (ref 36.0–46.0)
Hemoglobin: 12.2 g/dL (ref 12.0–15.0)
Immature Granulocytes: 1 %
Lymphocytes Relative: 27 %
Lymphs Abs: 2.7 10*3/uL (ref 0.7–4.0)
MCH: 27.6 pg (ref 26.0–34.0)
MCHC: 31.2 g/dL (ref 30.0–36.0)
MCV: 88.5 fL (ref 80.0–100.0)
Monocytes Absolute: 1 10*3/uL (ref 0.1–1.0)
Monocytes Relative: 10 %
Neutro Abs: 6.2 10*3/uL (ref 1.7–7.7)
Neutrophils Relative %: 60 %
Platelets: 295 10*3/uL (ref 150–400)
RBC: 4.42 MIL/uL (ref 3.87–5.11)
RDW: 14.9 % (ref 11.5–15.5)
WBC: 10.2 10*3/uL (ref 4.0–10.5)
nRBC: 0 % (ref 0.0–0.2)

## 2019-01-03 NOTE — ED Triage Notes (Signed)
Pt c/o cough, SOB, and V x 1 week.

## 2019-01-04 LAB — COMPREHENSIVE METABOLIC PANEL
ALT: 13 U/L (ref 0–44)
AST: 14 U/L — ABNORMAL LOW (ref 15–41)
Albumin: 3.3 g/dL — ABNORMAL LOW (ref 3.5–5.0)
Alkaline Phosphatase: 58 U/L (ref 38–126)
Anion gap: 6 (ref 5–15)
BUN: 14 mg/dL (ref 8–23)
CO2: 26 mmol/L (ref 22–32)
Calcium: 8.9 mg/dL (ref 8.9–10.3)
Chloride: 105 mmol/L (ref 98–111)
Creatinine, Ser: 0.75 mg/dL (ref 0.44–1.00)
GFR calc Af Amer: >60 mL/min (ref >60)
GFR calc non Af Amer: >60 mL/min (ref >60)
Glucose, Bld: 138 mg/dL — ABNORMAL HIGH (ref 70–99)
Potassium: 3.8 mmol/L (ref 3.5–5.1)
Sodium: 137 mmol/L (ref 135–145)
Total Bilirubin: 0.8 mg/dL (ref 0.3–1.2)
Total Protein: 7 g/dL (ref 6.5–8.1)

## 2019-01-04 LAB — BRAIN NATRIURETIC PEPTIDE: B Natriuretic Peptide: 138 pg/mL — ABNORMAL HIGH (ref 0.0–100.0)

## 2019-01-04 MED ORDER — BENZONATATE 100 MG PO CAPS: 100.0000 mg | ORAL_CAPSULE | Freq: Three times a day (TID) | ORAL | 0 refills | Status: DC

## 2019-01-04 NOTE — Discharge Instructions (Signed)
Please follow-up with your PCP, as needed.  Please take your torsemide.  Please take your Claritin as well as use your albuterol inhaler for any wheezing.  Please call your PCP should you require a refill on those medications.   If you live with, or provide care at home for, a person confirmed to have, or being evaluated for, COVID-19 infection please follow these guidelines to prevent infection:  Follow healthcare providers instructions Make sure that you understand and can help the patient follow any healthcare provider instructions for all care.  Provide for the patients basic needs You should help the patient with basic needs in the home and provide support for getting groceries, prescriptions, and other personal needs.  Monitor the patients symptoms If they are getting sicker, call his or her medical provider a  This will help the healthcare providers office take steps to keep other people from getting infected. Ask the healthcare provider to call the local or state health department.  Limit the number of people who have contact with the patient If possible, have only one caregiver for the patient. Other household members should stay in another home or place of residence. If this is not possible, they should stay in another room, or be separated from the patient as much as possible. Use a separate bathroom, if available. Restrict visitors who do not have an essential need to be in the home.  Keep older adults, very young children, and other sick people away from the patient Keep older adults, very young children, and those who have compromised immune systems or chronic health conditions away from the patient. This includes people with chronic heart, lung, or kidney conditions, diabetes, and cancer.  Ensure good ventilation Make sure that shared spaces in the home have good air flow, such as from an air conditioner or an opened window, weather permitting.  Wash your hands  often Wash your hands often and thoroughly with soap and water for at least 20 seconds. You can use an alcohol based hand sanitizer if soap and water are not available and if your hands are not visibly dirty. Avoid touching your eyes, nose, and mouth with unwashed hands. Use disposable paper towels to dry your hands. If not available, use dedicated cloth towels and replace them when they become wet.  Wear a facemask and gloves Wear a disposable facemask at all times in the room and gloves when you touch or have contact with the patients blood, body fluids, and/or secretions or excretions, such as sweat, saliva, sputum, nasal mucus, vomit, urine, or feces.  Ensure the mask fits over your nose and mouth tightly, and do not touch it during use. Throw out disposable facemasks and gloves after using them. Do not reuse. Wash your hands immediately after removing your facemask and gloves. If your personal clothing becomes contaminated, carefully remove clothing and launder. Wash your hands after handling contaminated clothing. Place all used disposable facemasks, gloves, and other waste in a lined container before disposing them with other household waste. Remove gloves and wash your hands immediately after handling these items.  Do not share dishes, glasses, or other household items with the patient Avoid sharing household items. You should not share dishes, drinking glasses, cups, eating utensils, towels, bedding, or other items After the person uses these items, you should wash them thoroughly with soap and water.  Wash laundry thoroughly Immediately remove and wash clothes or bedding that have blood, body fluids, and/or secretions or excretions, such as sweat, saliva,  sputum, nasal mucus, vomit, urine, or feces, on them. Wear gloves when handling laundry from the patient. Read and follow directions on labels of laundry or clothing items and detergent. In general, wash and dry with the warmest  temperatures recommended on the label.  Clean all areas the individual has used often Clean all touchable surfaces, such as counters, tabletops, doorknobs, bathroom fixtures, toilets, phones, keyboards, tablets, and bedside tables, every day. Also, clean any surfaces that may have blood, body fluids, and/or secretions or excretions on them. Wear gloves when cleaning surfaces the patient has come in contact with. Use a diluted bleach solution (e.g., dilute bleach with 1 part bleach and 10 parts water) or a household disinfectant with a label that says EPA-registered for coronaviruses. To make a bleach solution at home, add 1 tablespoon of bleach to 1 quart (4 cups) of water. For a larger supply, add  cup of bleach to 1 gallon (16 cups) of water. Read labels of cleaning products and follow recommendations provided on product labels. Labels contain instructions for safe and effective use of the cleaning product including precautions you should take when applying the product, such as wearing gloves or eye protection and making sure you have good ventilation during use of the product. Remove gloves and wash hands immediately after cleaning.  Monitor yourself for signs and symptoms of illness Caregivers and household members are considered close contacts, should monitor their health, and will be asked to limit movement outside of the home to the extent possible. Follow the monitoring steps for close contacts listed on the symptom monitoring form.   ? If you have additional questions, contact your local health department or call the epidemiologist on call at [phone] (available 24/7). ? This guidance is subject to change. For the most up-to-date guidance from [facility], please refer to their website: YouBlogs.pl

## 2019-01-04 NOTE — ED Provider Notes (Signed)
[facility] EMERGENCY DEPARTMENT Provider Note   CSN: [id number] Arrival date & time: 01/03/19  2232     History   Chief Complaint Chief Complaint  Patient presents with  . Cough    HPI Amy Tran is a 62 y.o. female with PMH significant for DM, HTN, HLD, GERD, fluid retention, OSA, and asthma who presents to the ED with a 1 week history of runny nose, postnasal drip, cough, and shortness of breath symptoms.  Patient reports that her symptoms present like "allergies" 7 days ago with rhinorrhea, mildly itchy eyes, and postnasal drip.  4 days ago she developed a cough with associated shortness of breath symptoms.  She also endorses coughing fits that have led to episodes of emesis.  She reports that she is been taking all of her medications, as prescribed.  She denies any obvious sick contacts.  She denies chest pain, headache or dizziness, abdominal pain or nausea, urinary symptoms, or change in bowel habits.  She does not wear self regularly.  She has a swollen left calf, but reports that it is unchanged since her initial evaluation several months ago which resulted in positive DVT and anticoagulation, which she has been taking as prescribed.      HPI  Past Medical History:  Diagnosis Date  . Arthritis   . Asthma   . Cancer [facility])    multiple skin cancers  . Diabetes mellitus without complication ([place])   . Diverticulosis   . Fluid retention   . GERD (gastroesophageal reflux disease)   . Heart murmur   . High cholesterol   . Hypertension   . Incomplete rotator cuff tear   . OSA (obstructive sleep apnea)   . PONV (postoperative nausea and vomiting)   . Wears glasses   . Wears partial dentures    bottom    Patient Active Problem List   Diagnosis Date Noted  . Iliac vein thrombosis, left ([place]) 09/12/2017  . Hypokalemia 04/02/2017  . GERD (gastroesophageal reflux disease) 10/21/2016  . Abdominal pain 10/21/2016  . Extrinsic asthma without complication [id number]   . Oral thrush 07/01/2016  . Rectocele 09/19/2015  . Diverticulosis of colon without hemorrhage   . Hx of adenomatous colonic polyps 12/01/2013  . Pain in joint, shoulder region 06/24/2013  . Muscle weakness (generalized) 06/24/2013  . Incomplete rotator cuff tear   . Diabetes mellitus without complication ([place])   . PONV (postoperative nausea and vomiting)   . Fluid retention   . Hypertension     Past Surgical History:  Procedure Laterality Date  . APPENDECTOMY    . CARPAL TUNNEL RELEASE Bilateral 2001   bilateral  . CERVICAL POLYPECTOMY  04/08/2017   Procedure: POLYPECTOMY;  Surgeon: [name], MD;  Location: [facility] ORS;  Service: Gynecology;;  Endometrial  . CHOLECYSTECTOMY    . COLONOSCOPY  2007   [id number] internal hemorrhoids. Diminutive rectal polyp at 10 cm, cold biopsied/removed. The remainder of the rectal mucosa appeared normal Swallow left-sided diverticula. Diminutive polyp at the splenic flexure cold biopsied/removed (adenomatous)  . COLONOSCOPY N/A 12/28/2013   Procedure: COLONOSCOPY;  Surgeon: Daneil Dolin, MD;  Location: [facility] ENDO SUITE;  Service: Endoscopy;  Laterality: N/A;  730 - moved to 8:30 - Ginger notified pt  . DILATION AND CURETTAGE, DIAGNOSTIC / THERAPEUTIC  03/07/2017  . ERCP  2002  . GASTRIC BYPASS  2004  . HYSTEROSCOPY W/D&C N/A 04/08/2017   Procedure: DILATATION AND CURETTAGE /HYSTEROSCOPY;  Surgeon: [name], MD;  Location:  [facility] ORS;  Service: Gynecology;  Laterality: N/A;  . INTRAVASCULAR ULTRASOUND/IVUS N/A 09/12/2017   Procedure: INTRAVASCULAR ULTRASOUND/IVUS;  Surgeon: Elam Dutch, MD;  Location: [place] CV LAB;  Service: Cardiovascular;  Laterality: N/A;  . NECK SURGERY  1998   fusion  . PERCUTANEOUS VENOUS THROMBECTOMY,LYSIS WITH INTRAVASCULAR ULTRASOUND (IVUS) Left 09/13/2017   Procedure: REMOVAL OF LYSIS CATHETER AND INTRAVASCULAR ULTRASOUND (IVUS) LEFT ILIAC VEIN;  Surgeon: Elam Dutch, MD;  Location: [place];  Service:  Vascular;  Laterality: Left;  . PERIPHERAL VASCULAR INTERVENTION  09/15/2017   Procedure: PERIPHERAL VASCULAR INTERVENTION;  Surgeon: Waynetta Sandy, MD;  Location: [place] CV LAB;  Service: Cardiovascular;;  VEINOUS/STENT  . PERIPHERAL VASCULAR THROMBECTOMY Left 09/12/2017   Procedure: PERIPHERAL VASCULAR THROMBECTOMY;  Surgeon: Elam Dutch, MD;  Location: [place] CV LAB;  Service: Cardiovascular;  Laterality: Left;  . PERIPHERAL VASCULAR THROMBECTOMY Left 09/15/2017   Procedure: PERIPHERAL VASCULAR THROMBECTOMY;  Surgeon: Waynetta Sandy, MD;  Location: [place] CV LAB;  Service: Cardiovascular;  Laterality: Left;  IVC TO LT FEM/POP VEIN  . SHOULDER ARTHROSCOPY WITH SUBACROMIAL DECOMPRESSION Left 06/23/2013   Procedure: LEFT SHOULDER ARTHROSCOPY WITH DEBRIDEMENT ROTATOR CUFF AND LABRUM;  Surgeon: Lorn Junes, MD;  Location: [place];  Service: Orthopedics;  Laterality: Left;  . SHOULDER SURGERY  1999   left     OB History    Gravida  2   Para  2   Term  2   Preterm      AB      Living  2     SAB      TAB      Ectopic      Multiple      Live Births  2            Home Medications    Prior to Admission medications   Medication Sig Start Date End Date Taking? Authorizing Provider  amLODipine (NORVASC) 5 MG tablet Take 5 mg by mouth daily.    [provider]  aspirin EC 81 MG tablet Take 81 mg by mouth daily.    [provider]  BD PEN NEEDLE NANO U/F 32G X 4 MM MISC USE FOR [place] INJECTIONS 08/03/18   [provider]  canagliflozin (INVOKANA) 300 MG TABS tablet Take 300 mg by mouth daily before breakfast.    [provider]  doxycycline (VIBRAMYCIN) 100 MG capsule Take 1 capsule (100 mg total) by mouth 2 (two) times daily. 12/03/18   Nickel, Sharmon Leyden, NP  fluticasone (FLONASE) 50 MCG/ACT nasal spray Place 2 sprays into both nostrils as needed.  06/29/18   [provider]   glipiZIDE (GLUCOTROL) 5 MG tablet Take 5 mg by mouth daily before breakfast.     [provider]  levocetirizine (XYZAL) 5 MG tablet Take 5 mg by mouth daily. 06/29/18   [provider]  metFORMIN (GLUCOPHAGE) 1000 MG tablet Take 1,000 mg by mouth daily with breakfast.     [provider]  pantoprazole (PROTONIX) 40 MG tablet Take 40 mg by mouth daily.    [provider]  potassium chloride SA (K-DUR,KLOR-CON) 20 MEQ tablet Take 1 tablet (20 mEq total) by mouth daily. 09/16/17   Rhyne, Samantha J, PA-C  PROAIR HFA 108 (90 Base) MCG/ACT inhaler INHALE 2 PUFFS INTO THE LUNGS EVERY 6 (SIX) HOURS AS NEEDED FOR WHEEZING OR SHORTNESS OF BREATH. 09/05/17   Chesley Mires, MD  rivaroxaban Alveda Reasons) 20  MG TABS tablet Take 1 tablet (20 mg total) by mouth daily with supper. 09/17/18   Elam Dutch, MD  simvastatin (ZOCOR) 20 MG tablet Take 20 mg by mouth at bedtime.  07/29/14   [provider]  spironolactone (ALDACTONE) 25 MG tablet Take 25 mg by mouth daily.    [provider]  torsemide (DEMADEX) 20 MG tablet TAKE 2 TABS IN AM AND 1 TAB IN PM Patient taking differently: Take 60 mg by mouth daily.  02/02/16   Arnoldo Lenis, MD  VICTOZA 18 MG/3ML SOPN Taking 1.2mg  daily 08/04/18   [provider]    Family History Family History  Problem Relation Age of Onset  . Diabetes Father   . Hypertension Father   . Parkinson's disease Father   . Cancer Father        not sure what kind  . Hypertension Mother   . Heart disease Mother   . Drug abuse Sister   . Mesothelioma Brother   . Cancer Daughter        non-hodgkins lmphoma  . Hypertension Brother   . Diabetes Brother   . Colon cancer Neg Hx   . Gastric cancer Neg Hx   . Esophageal cancer Neg Hx     Social History Social History   Tobacco Use  . Smoking status: Current Every Day Smoker    Packs/day: 0.50    Years: 29.00    Pack years: 14.50    Types: Cigarettes    Start date:  04/09/1970  . Smokeless tobacco: Never Used  Substance Use Topics  . Alcohol use: Yes    Comment: occ beer  . Drug use: No     Allergies   Penicillins   Review of Systems Review of Systems  All other systems reviewed and are negative.    Physical Exam Updated Vital Signs BP (!) 165/65   Pulse (!) 46   Temp 98.1 F (36.7 C)   Resp 18   Ht 5\' 4"  (1.626 m)   Wt 125.2 kg   SpO2 98%   BMI 47.38 kg/m   Physical Exam Updated Vital Signs BP (!) 165/65   Pulse (!) 46   Temp 98.1 F (36.7 C)   Resp 18   Ht 5\' 4"  (1.626 m)   Wt 125.2 kg   SpO2 98%   BMI 47.38 kg/m   Physical Exam Vitals signs and nursing note reviewed. Exam conducted with a chaperone present.  Constitutional:      Appearance: Normal appearance.  HENT:     Head: Normocephalic and atraumatic.     Nose: Rhinorrhea present.  Eyes:     Conjunctiva/sclera: Conjunctivae normal.     Comments: Clear, thin discharge from left eye.  Conjunctiva are noninjected.  Cardiovascular:     Rate and Rhythm: Regular rhythm.     Pulses: Normal pulses.     Heart sounds: Normal heart sounds.     Comments: Mildly bradycardic. Pulmonary:     Effort: Pulmonary effort is normal.     Comments: No acute respiratory distress or accessory muscle use.  Wheezing auscultated bilaterally, particularly in the right lower base. Abdominal:     General: Abdomen is flat.     Palpations: Abdomen is soft.     Tenderness: There is no abdominal tenderness. There is no guarding.  Skin:    General: Skin is dry.     Capillary Refill: Capillary refill takes less than 2 seconds.  Neurological:  Mental Status: She is alert.     GCS: GCS eye subscore is 4. GCS verbal subscore is 5. GCS motor subscore is 6.  Psychiatric:        Mood and Affect: Mood normal.        Behavior: Behavior normal.        Thought Content: Thought content normal.    ED Treatments / Results  Labs (all labs ordered are listed, but only abnormal results are  displayed) Labs Reviewed  BRAIN NATRIURETIC PEPTIDE - Abnormal; Notable for the following components:      Result Value   B Natriuretic Peptide 138.0 (*)    All other components within normal limits  COMPREHENSIVE METABOLIC PANEL - Abnormal; Notable for the following components:   Glucose, Bld 138 (*)    Albumin 3.3 (*)    AST 14 (*)    All other components within normal limits  CBC WITH DIFFERENTIAL/PLATELET    EKG EKG Interpretation  Date/Time:  Sunday January 03 2019 23:56:15 EST Ventricular Rate:  47 PR Interval:    QRS Duration: 106 QT Interval:  478 QTC Calculation: 423 R Axis:   51 Text Interpretation: Sinus bradycardia Atrial premature complex Low voltage, precordial leads No significant change since last tracing Confirmed by Orpah Greek [phone]) on 01/04/2019 12:51:29 AM   Radiology Dg Chest Portable 1 View  Result Date: 01/03/2019 CLINICAL DATA:  Cough and shortness of breath for 1 week EXAM: PORTABLE CHEST 1 VIEW COMPARISON:  06/02/2017 FINDINGS: Cardiac shadow is within normal limits. The lungs are well aerated bilaterally. No focal infiltrate or sizable effusion is seen. No bony abnormality is noted. Degenerative changes of the thoracic spine are seen. IMPRESSION: No acute abnormality noted. Electronically Signed   By: Inez Catalina M.D.   On: 01/03/2019 23:49    Procedures Procedures (including critical care time)  Medications Ordered in ED Medications - No data to display   Initial Impression / Assessment and Plan / ED Course  I have reviewed the triage vital signs and the nursing notes.  Pertinent labs & imaging results that were available during my care of the patient were reviewed by me and considered in my medical decision making (see chart for details).       I personally reviewed patient's past medical record and she recently underwent IVIS of numerous left leg vessels with thrombectomy in 03/2018 for her extensive left lower extremity  DVT.  On reevaluation on 09/17/2018, patient had duplex exam with no evidence of thrombus.  She continues to have persistent swelling and redness of left lower extremity, but reports that is unchanged.  She is also regularly taking her anticoagulation, as prescribed.  DG chest was reviewed and demonstrated no acute cardiopulmonary findings.  Her CBC was also reviewed and demonstrates no evidence of anemia or leukocytosis concerning for infection that might otherwise explain her shortness of breath symptoms.  Her CMP was also interpreted and is unremarkable.  Her BNP was mildly elevated to 138.  On reexamination, patient reports that she did not take her torsemide this morning.  Recommended that she do so given her elevated BNP.  No pleural effusion on chest x-ray or pitting edema and not overly concerned with fluid overload status.  Her history of runny nose and postnasal drip which preceded cough and shortness of breath symptoms is consistent with a upper respiratory infection, likely viral, or seasonal allergies.  Offered her albuterol breathing treatment here in the ED, to which she declined.  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  She will also restart her Claritin as this may be related to seasonal allergies.  She feels much improved on reevaluation and is ready for discharge.  She was reassured by the work-up.  Will obtain COVID-19 testing and have her follow-up with her PCP over the phone.  We will also prescribe her Tessalon Perles for her cough symptoms.  All of the evaluation and work-up results were discussed with the patient and any family at bedside. They were provided opportunity to ask any additional questions and have none at this time. They have expressed understanding of verbal discharge instructions as well as return precautions and are agreeable to the plan.   Amy Tran was evaluated in Emergency Department on 01/04/2019 for the  symptoms described in the history of present illness. She was evaluated in the context of the global COVID-19 pandemic, which necessitated consideration that the patient might be at risk for infection with the SARS-CoV-2 virus that causes COVID-19. Institutional protocols and algorithms that pertain to the evaluation of patients at risk for COVID-19 are in a state of rapid change based on information released by regulatory bodies including the CDC and federal and state organizations. These policies and algorithms were followed during the patient's care in the ED.    Final Clinical Impressions(s) / ED Diagnoses   Final diagnoses:  None    ED Discharge Orders    None       Corena Herter, PA-C 01/04/19 0113    Maudie Flakes, MD 01/04/19 [phone]

## 2019-01-05 LAB — NOVEL CORONAVIRUS, NAA (HOSP ORDER, SEND-OUT TO REF LAB; TAT 18-24 HRS): SARS-CoV-2, NAA: NOT DETECTED

## 2019-02-17 ENCOUNTER — Other Ambulatory Visit: Payer: Medicaid Other | Admitting: Obstetrics and Gynecology

## 2019-02-26 ENCOUNTER — Encounter: Payer: Self-pay | Admitting: Obstetrics and Gynecology

## 2019-02-26 ENCOUNTER — Other Ambulatory Visit: Payer: Self-pay

## 2019-02-26 ENCOUNTER — Other Ambulatory Visit (HOSPITAL_COMMUNITY)
Admission: RE | Admit: 2019-02-26 | Discharge: 2019-02-26 | Disposition: A | Discharge status: Home / Self Care | Payer: Medicaid Other | Source: Ambulatory Visit | Attending: Obstetrics and Gynecology | Admitting: Obstetrics and Gynecology

## 2019-02-26 ENCOUNTER — Ambulatory Visit (INDEPENDENT_AMBULATORY_CARE_PROVIDER_SITE_OTHER): Payer: Medicaid Other | Admitting: Obstetrics and Gynecology

## 2019-02-26 VITALS — Ht 65.5 in | Wt 277.2 lb

## 2019-02-26 DIAGNOSIS — Z0000 Encounter for general adult medical examination without abnormal findings: Secondary | ICD-10-CM

## 2019-02-26 NOTE — Progress Notes (Signed)
Patient ID: Amy Tran, female   DOB: 1955-10-25, 64 y.o.   MRN: [id number]   Assessment:  1. Annual Gyn Exam 2. Rectocele and enteroele 3. Morbid Obesity Body mass index is 45.43 kg/m. Plan:  1. Pap smear done, next pap due 5 years 2. Return  prn for reassessment of rectocele and enterocele repair, once Covid is controlled  3    Annual mammogram advised after age 88 Subjective:  Amy Tran is a 64 y.o. female [phone] who presents for annual exam. No LMP recorded. Patient is postmenopausal. The patient has no complaints today. She had gastric bypass surgery at 450 lbs. She has not noticed any abnormalities during her breast self-exams. She also had a left iliac vein thrombosis and is taking Xarelto 20 mg daily, lifetime use.  She has a rectocele but is able to move her bowels well.  She does sense that the bulge in her rectum is increasing in size  The following portions of the patient's history were reviewed and updated as appropriate: allergies, current medications, past family history, past medical history, past social history, past surgical history and problem list. Past Medical History:  Diagnosis Date  . Arthritis   . Asthma   . Cancer [facility])    multiple skin cancers  . Diabetes mellitus without complication ([place])   . Diverticulosis   . Fluid retention   . GERD (gastroesophageal reflux disease)   . Heart murmur   . High cholesterol   . Hypertension   . Incomplete rotator cuff tear   . OSA (obstructive sleep apnea)   . PONV (postoperative nausea and vomiting)   . Wears glasses   . Wears partial dentures    bottom    Past Surgical History:  Procedure Laterality Date  . APPENDECTOMY    . CARPAL TUNNEL RELEASE Bilateral 2001   bilateral  . CERVICAL POLYPECTOMY  04/08/2017   Procedure: POLYPECTOMY;  Surgeon: [name], MD;  Location: [facility] OR[facility];  Service: Gynecology;;  Endometrial  . CHOLECYSTECTOMY    . COLONOSCOPY  2007   [id number] internal  hemorrhoids. Diminutive rectal polyp at 10 cm, cold biopsied/removed. The remainder of the rectal mucosa appeared normal Swallow left-sided diverticula. Diminutive polyp at the splenic flexure cold biopsied/removed (adenomatous)  . COLONOSCOPY N/A 12/28/2013   Procedure: COLONOSCOPY;  Surgeon: Daneil Dolin, MD;  [facility];  Service: Endoscopy;  Laterality: N/A;  730 - moved to 8:30 - Ginger notified pt  . DILATION AND CURETTAGE, DIAGNOSTIC / THERAPEUTIC  03/07/2017  . ERCP  2002  . GASTRIC BYPASS  2004  . HYSTEROSCOPY WITH D & C N/A 04/08/2017   Procedure: DILATATION AND CURETTAGE /HYSTEROSCOPY;  Surgeon: [name], MD;  Location: [facility];  Service: Gynecology;  Laterality: N/A;  . INTRAVASCULAR ULTRASOUND/IVUS N/A 09/12/2017   Procedure: INTRAVASCULAR ULTRASOUND/IVUS;  Surgeon: Elam Dutch, MD;  Location: [place] CV LAB;  Service: Cardiovascular;  Laterality: N/A;  . NECK SURGERY  1998   fusion  . PERCUTANEOUS VENOUS THROMBECTOMY,LYSIS WITH INTRAVASCULAR ULTRASOUND (IVUS) Left 09/13/2017   Procedure: REMOVAL OF LYSIS CATHETER AND INTRAVASCULAR ULTRASOUND (IVUS) LEFT ILIAC VEIN;  Surgeon: Elam Dutch, MD;  Location: [place];  Service: Vascular;  Laterality: Left;  . PERIPHERAL VASCULAR INTERVENTION  09/15/2017   Procedure: PERIPHERAL VASCULAR INTERVENTION;  Surgeon: Waynetta Sandy, MD;  Location: [place] CV LAB;  Service: Cardiovascular;;  VEINOUS/STENT  . PERIPHERAL VASCULAR THROMBECTOMY Left 09/12/2017   Procedure: PERIPHERAL VASCULAR THROMBECTOMY;  Surgeon: Elam Dutch, MD;  Location: [place] CV LAB;  Service: Cardiovascular;  Laterality: Left;  . PERIPHERAL VASCULAR THROMBECTOMY Left 09/15/2017   Procedure: PERIPHERAL VASCULAR THROMBECTOMY;  Surgeon: Waynetta Sandy, MD;  Location: [place] CV LAB;  Service: Cardiovascular;  Laterality: Left;  IVC TO LT FEM/POP VEIN  . SHOULDER ARTHROSCOPY WITH SUBACROMIAL DECOMPRESSION Left  06/23/2013   Procedure: LEFT SHOULDER ARTHROSCOPY WITH DEBRIDEMENT ROTATOR CUFF AND LABRUM;  Surgeon: Lorn Junes, MD;  Location: [place];  Service: Orthopedics;  Laterality: Left;  . SHOULDER SURGERY  1999   left     Current Outpatient Medications:  .  amLODipine (NORVASC) 5 MG tablet, Take 5 mg by mouth daily., Disp: , Rfl:  .  aspirin EC 81 MG tablet, Take 81 mg by mouth daily., Disp: , Rfl:  .  BD PEN NEEDLE NANO U/F 32G X 4 MM MISC, USE FOR [place] INJECTIONS, Disp: , Rfl:  .  benzonatate (TESSALON) 100 MG capsule, Take 1 capsule (100 mg total) by mouth every 8 (eight) hours., Disp: 21 capsule, Rfl: 0 .  canagliflozin (INVOKANA) 300 MG TABS tablet, Take 300 mg by mouth daily before breakfast., Disp: , Rfl:  .  doxycycline (VIBRAMYCIN) 100 MG capsule, Take 1 capsule (100 mg total) by mouth 2 (two) times daily., Disp: 28 capsule, Rfl: 0 .  fluticasone (FLONASE) 50 MCG/ACT nasal spray, Place 2 sprays into both nostrils as needed. , Disp: , Rfl:  .  glipiZIDE (GLUCOTROL) 5 MG tablet, Take 5 mg by mouth daily before breakfast. , Disp: , Rfl:  .  levocetirizine (XYZAL) 5 MG tablet, Take 5 mg by mouth daily., Disp: , Rfl:  .  metFORMIN (GLUCOPHAGE) 1000 MG tablet, Take 1,000 mg by mouth daily with breakfast. , Disp: , Rfl:  .  pantoprazole (PROTONIX) 40 MG tablet, Take 40 mg by mouth daily., Disp: , Rfl:  .  potassium chloride SA (K-DUR,KLOR-CON) 20 MEQ tablet, Take 1 tablet (20 mEq total) by mouth daily., Disp: , Rfl:  .  PROAIR HFA 108 (90 Base) MCG/ACT inhaler, INHALE 2 PUFFS INTO THE LUNGS EVERY 6 (SIX) HOURS AS NEEDED FOR WHEEZING OR SHORTNESS OF BREATH., Disp: 8.5 Inhaler, Rfl: 0 .  rivaroxaban (XARELTO) 20 MG TABS tablet, Take 1 tablet (20 mg total) by mouth daily with supper., Disp: 90 tablet, Rfl: 3 .  simvastatin (ZOCOR) 20 MG tablet, Take 20 mg by mouth at bedtime. , Disp: , Rfl:  .  spironolactone (ALDACTONE) 25 MG tablet, Take 25 mg by mouth daily., Disp: , Rfl:   .  torsemide (DEMADEX) 20 MG tablet, TAKE 2 TABS IN AM AND 1 TAB IN PM (Patient taking differently: Take 60 mg by mouth daily. ), Disp: 90 tablet, Rfl: 3 .  VICTOZA 18 MG/3ML SOPN, Taking 1.2mg  daily, Disp: , Rfl:   Review of Systems Constitutional: negative Gastrointestinal: negative no difficulty with defecation.  Bowel movements are loose Genitourinary: negative  Objective:   Ht 5' 5.5" (1.664 m)   Wt 277 lb 3.2 oz (125.7 kg)   BMI 45.43 kg/m    BMI: There is no height or weight on file to calculate BMI.  General Appearance: Alert, appropriate appearance for age. No acute distress HEENT: Grossly normal Neck / Thyroid:  Cardiovascular: RRR; normal S1, S2, no murmur Lungs: CTA bilaterally Back: No CVAT Breast Exam: No dimpling, nipple retraction or discharge. No masses or nodes., Normal to inspection, Normal breast tissue bilaterally and No masses or nodes.No  dimpling, nipple retraction or discharge. Gastrointestinal: Soft, non-tender, no masses or organomegaly Pelvic Exam: Vulva and vagina appear normal. Bimanual exam reveals normal uterus and adnexa. Rectovaginal: Rectocele and enterocele, guaiac negative stool obtained.  While I am doing the rectal exam to have the patient coughed and there is a sensation of a sliding bulge in front of the bowel, consistent with a enterocele.  The uterus is actually well supported.  The introitus diameter is markedly multiparous, lax Lymphatic Exam: Non-palpable nodes in neck, clavicular, axillary, or inguinal regions  Skin: no rash or abnormalities Neurologic: Normal gait and speech, no tremor  Psychiatric: Alert and oriented, appropriate affect.  Urinalysis:Not done  Mallory Shirk. MD Pgr [phone] 11:28 AM  By signing my name below, I, De Burrs, attest that this documentation has been prepared under the direction and in the presence of [name], MD. Electronically Signed: De Burrs, Medical Scribe. 02/26/19. 11:28  AM.  I personally performed the services described in this documentation, which was SCRIBED in my presence. The recorded information has been reviewed and considered accurate. It has been edited as necessary during review. [name], MD

## 2019-03-02 LAB — CYTOLOGY - PAP
Comment: NEGATIVE
Diagnosis: NEGATIVE
High risk HPV: NEGATIVE

## 2019-04-25 ENCOUNTER — Inpatient Hospital Stay (HOSPITAL_COMMUNITY)
Admission: EM | Admit: 2019-04-25 | Discharge: 2019-04-28 | DRG: 247 | Disposition: A | Discharge status: Home / Self Care | Payer: Medicaid Other | Attending: Internal Medicine | Admitting: Internal Medicine

## 2019-04-25 ENCOUNTER — Emergency Department (HOSPITAL_COMMUNITY): Payer: Medicaid Other

## 2019-04-25 ENCOUNTER — Other Ambulatory Visit: Payer: Self-pay

## 2019-04-25 ENCOUNTER — Encounter (HOSPITAL_COMMUNITY): Payer: Self-pay | Admitting: Emergency Medicine

## 2019-04-25 DIAGNOSIS — Z79899 Other long term (current) drug therapy: Secondary | ICD-10-CM

## 2019-04-25 DIAGNOSIS — I214 Non-ST elevation (NSTEMI) myocardial infarction: Principal | ICD-10-CM

## 2019-04-25 DIAGNOSIS — I249 Acute ischemic heart disease, unspecified: Secondary | ICD-10-CM

## 2019-04-25 DIAGNOSIS — E785 Hyperlipidemia, unspecified: Secondary | ICD-10-CM | POA: Diagnosis present

## 2019-04-25 DIAGNOSIS — I2510 Atherosclerotic heart disease of native coronary artery without angina pectoris: Secondary | ICD-10-CM | POA: Diagnosis present

## 2019-04-25 DIAGNOSIS — Z833 Family history of diabetes mellitus: Secondary | ICD-10-CM

## 2019-04-25 DIAGNOSIS — Z809 Family history of malignant neoplasm, unspecified: Secondary | ICD-10-CM

## 2019-04-25 DIAGNOSIS — E7800 Pure hypercholesterolemia, unspecified: Secondary | ICD-10-CM | POA: Diagnosis present

## 2019-04-25 DIAGNOSIS — Z7984 Long term (current) use of oral hypoglycemic drugs: Secondary | ICD-10-CM

## 2019-04-25 DIAGNOSIS — K5790 Diverticulosis of intestine, part unspecified, without perforation or abscess without bleeding: Secondary | ICD-10-CM | POA: Diagnosis present

## 2019-04-25 DIAGNOSIS — Z7901 Long term (current) use of anticoagulants: Secondary | ICD-10-CM

## 2019-04-25 DIAGNOSIS — I480 Paroxysmal atrial fibrillation: Secondary | ICD-10-CM | POA: Diagnosis present

## 2019-04-25 DIAGNOSIS — R001 Bradycardia, unspecified: Secondary | ICD-10-CM | POA: Diagnosis not present

## 2019-04-25 DIAGNOSIS — Z813 Family history of other psychoactive substance abuse and dependence: Secondary | ICD-10-CM

## 2019-04-25 DIAGNOSIS — E669 Obesity, unspecified: Secondary | ICD-10-CM | POA: Diagnosis present

## 2019-04-25 DIAGNOSIS — K219 Gastro-esophageal reflux disease without esophagitis: Secondary | ICD-10-CM | POA: Diagnosis present

## 2019-04-25 DIAGNOSIS — Z820 Family history of epilepsy and other diseases of the nervous system: Secondary | ICD-10-CM

## 2019-04-25 DIAGNOSIS — Z86718 Personal history of other venous thrombosis and embolism: Secondary | ICD-10-CM

## 2019-04-25 DIAGNOSIS — R079 Chest pain, unspecified: Secondary | ICD-10-CM | POA: Diagnosis present

## 2019-04-25 DIAGNOSIS — Z85828 Personal history of other malignant neoplasm of skin: Secondary | ICD-10-CM

## 2019-04-25 DIAGNOSIS — Z9884 Bariatric surgery status: Secondary | ICD-10-CM

## 2019-04-25 DIAGNOSIS — E876 Hypokalemia: Secondary | ICD-10-CM | POA: Diagnosis present

## 2019-04-25 DIAGNOSIS — E1151 Type 2 diabetes mellitus with diabetic peripheral angiopathy without gangrene: Secondary | ICD-10-CM | POA: Diagnosis present

## 2019-04-25 DIAGNOSIS — Z955 Presence of coronary angioplasty implant and graft: Secondary | ICD-10-CM

## 2019-04-25 DIAGNOSIS — G4733 Obstructive sleep apnea (adult) (pediatric): Secondary | ICD-10-CM | POA: Diagnosis present

## 2019-04-25 DIAGNOSIS — Z6841 Body Mass Index (BMI) 40.0 and over, adult: Secondary | ICD-10-CM

## 2019-04-25 DIAGNOSIS — Z7982 Long term (current) use of aspirin: Secondary | ICD-10-CM

## 2019-04-25 DIAGNOSIS — M1990 Unspecified osteoarthritis, unspecified site: Secondary | ICD-10-CM | POA: Diagnosis present

## 2019-04-25 DIAGNOSIS — F17210 Nicotine dependence, cigarettes, uncomplicated: Secondary | ICD-10-CM | POA: Diagnosis present

## 2019-04-25 DIAGNOSIS — Z20822 Contact with and (suspected) exposure to covid-19: Secondary | ICD-10-CM | POA: Diagnosis present

## 2019-04-25 DIAGNOSIS — F17200 Nicotine dependence, unspecified, uncomplicated: Secondary | ICD-10-CM | POA: Diagnosis present

## 2019-04-25 DIAGNOSIS — J45909 Unspecified asthma, uncomplicated: Secondary | ICD-10-CM | POA: Diagnosis present

## 2019-04-25 DIAGNOSIS — I10 Essential (primary) hypertension: Secondary | ICD-10-CM | POA: Diagnosis present

## 2019-04-25 DIAGNOSIS — Z8249 Family history of ischemic heart disease and other diseases of the circulatory system: Secondary | ICD-10-CM

## 2019-04-25 LAB — CBC
HCT: 40.7 % (ref 36.0–46.0)
Hemoglobin: 12.8 g/dL (ref 12.0–15.0)
MCH: 26.8 pg (ref 26.0–34.0)
MCHC: 31.4 g/dL (ref 30.0–36.0)
MCV: 85.3 fL (ref 80.0–100.0)
Platelets: 302 10*3/uL (ref 150–400)
RBC: 4.77 MIL/uL (ref 3.87–5.11)
RDW: 14.3 % (ref 11.5–15.5)
WBC: 10.9 10*3/uL — ABNORMAL HIGH (ref 4.0–10.5)
nRBC: 0 % (ref 0.0–0.2)

## 2019-04-25 LAB — BASIC METABOLIC PANEL
Anion gap: 11 (ref 5–15)
BUN: 15 mg/dL (ref 8–23)
CO2: 27 mmol/L (ref 22–32)
Calcium: 9.3 mg/dL (ref 8.9–10.3)
Chloride: 97 mmol/L — ABNORMAL LOW (ref 98–111)
Creatinine, Ser: 0.92 mg/dL (ref 0.44–1.00)
GFR calc Af Amer: >60 mL/min (ref >60)
GFR calc non Af Amer: >60 mL/min (ref >60)
Glucose, Bld: 268 mg/dL — ABNORMAL HIGH (ref 70–99)
Potassium: 2.9 mmol/L — ABNORMAL LOW (ref 3.5–5.1)
Sodium: 135 mmol/L (ref 135–145)

## 2019-04-25 LAB — TROPONIN I (HIGH SENSITIVITY): Troponin I (High Sensitivity): 890 ng/L (ref <18)

## 2019-04-25 MED ORDER — HEPARIN (PORCINE) 25000 UT/250ML-% IV SOLN
1500.0000 [IU]/h | INTRAVENOUS | Status: DC
  Administered 2019-04-26 (×2): 1200 [IU]/h via INTRAVENOUS
  Filled 2019-04-25 (×2): qty 250

## 2019-04-25 MED ORDER — HEPARIN BOLUS VIA INFUSION: 4000.0000 [IU] | Freq: Once | INTRAVENOUS | Status: DC

## 2019-04-25 MED ORDER — HEPARIN (PORCINE) 25000 UT/250ML-% IV SOLN
1500.0000 [IU]/h | INTRAVENOUS | Status: DC
  Filled 2019-04-25: qty 250

## 2019-04-25 MED ORDER — ASPIRIN 81 MG PO CHEW
324.0000 mg | CHEWABLE_TABLET | Freq: Once | ORAL | Status: AC
  Administered 2019-04-25: 324 mg via ORAL
  Filled 2019-04-25: qty 4

## 2019-04-25 MED ORDER — POTASSIUM CHLORIDE CRYS ER 20 MEQ PO TBCR
40.0000 meq | EXTENDED_RELEASE_TABLET | Freq: Once | ORAL | Status: AC
  Administered 2019-04-25: 40 meq via ORAL
  Filled 2019-04-25: qty 2

## 2019-04-25 NOTE — Progress Notes (Addendum)
ANTICOAGULATION CONSULT NOTE - Initial Consult  Pharmacy Consult for Heparin Indication: chest pain/ACS  Allergies  Allergen Reactions  . Penicillins Hives and Rash    Has patient had a PCN reaction causing immediate rash, facial/tongue/throat swelling, SOB or lightheadedness with hypotension: Yes Has patient had a PCN reaction causing severe rash involving mucus membranes or skin necrosis: No Has patient had a PCN reaction that required hospitalization No Has patient had a PCN reaction occurring within the last 10 years: No If all of the above answers are "NO", then may proceed with Cephalosporin use.     Patient Measurements: Height: 5\' 4"  (162.6 cm) Weight: 276 lb (125.2 kg) IBW/kg (Calculated) : 54.7 Heparin Dosing Weight: 85 kg  Vital Signs: Temp: 97.9 F (36.6 C) (03/14 2140) Temp Source: Oral (03/14 2140) BP: 153/86 (03/14 2230) Pulse Rate: 71 (03/14 2230)  Labs: Recent Labs    04/25/19 2155  HGB 12.8  HCT 40.7  PLT 302  CREATININE 0.92  TROPONINIHS 890*    Estimated Creatinine Clearance: 80.8 mL/min (by C-G formula based on SCr of 0.92 mg/dL).   Medical History: Past Medical History:  Diagnosis Date  . Arthritis   . Asthma   . Cancer [facility])    multiple skin cancers  . Diabetes mellitus without complication ([place])   . Diverticulosis   . Fluid retention   . GERD (gastroesophageal reflux disease)   . Heart murmur   . High cholesterol   . Hypertension   . Incomplete rotator cuff tear   . OSA (obstructive sleep apnea)   . PONV (postoperative nausea and vomiting)   . Wears glasses   . Wears partial dentures    bottom    Medications:  No current facility-administered medications on file prior to encounter.   Current Outpatient Medications on File Prior to Encounter  Medication Sig Dispense Refill  . ALBUTEROL IN Inhale into the lungs as needed.    Marland Kitchen amLODipine (NORVASC) 5 MG tablet Take 5 mg by mouth daily.    Marland Kitchen aspirin EC 81 MG tablet Take 81 mg  by mouth.     . BD PEN NEEDLE NANO U/F 32G X 4 MM MISC USE FOR VICTOZA INJECTIONS    . benzonatate (TESSALON) 100 MG capsule Take 1 capsule (100 mg total) by mouth every 8 (eight) hours. 21 capsule 0  . canagliflozin (INVOKANA) 300 MG TABS tablet Take 300 mg by mouth daily before breakfast.    . Cyanocobalamin (VITAMIN B-12 PO) Take by mouth daily.    Marland Kitchen doxycycline (VIBRAMYCIN) 100 MG capsule Take 1 capsule (100 mg total) by mouth 2 (two) times daily. 28 capsule 0  . fluticasone (FLONASE) 50 MCG/ACT nasal spray Place 2 sprays into both nostrils as needed.     Marland Kitchen glipiZIDE (GLUCOTROL) 5 MG tablet Take 5 mg by mouth daily before breakfast.     . levocetirizine (XYZAL) 5 MG tablet Take 5 mg by mouth daily.    Marland Kitchen losartan (COZAAR) 100 MG tablet Take 100 mg by mouth daily.    . Magnesium Oxide 500 MG TABS Take by mouth.    . metFORMIN (GLUCOPHAGE) 1000 MG tablet Take 1,000 mg by mouth daily with breakfast.     . pantoprazole (PROTONIX) 40 MG tablet Take 40 mg by mouth daily.    . potassium chloride SA (K-DUR,KLOR-CON) 20 MEQ tablet Take 1 tablet (20 mEq total) by mouth daily. (Patient taking differently: Take 20 mEq by mouth every other day. )    .  rivaroxaban (XARELTO) 20 MG TABS tablet Take 1 tablet (20 mg total) by mouth daily with supper. 90 tablet 3  . simvastatin (ZOCOR) 20 MG tablet Take 20 mg by mouth at bedtime.     Marland Kitchen spironolactone (ALDACTONE) 25 MG tablet Take 25 mg by mouth daily.    Marland Kitchen torsemide (DEMADEX) 20 MG tablet TAKE 2 TABS IN AM AND 1 TAB IN PM (Patient taking differently: Take 60 mg by mouth daily. ) 90 tablet 3  . VICTOZA 18 MG/3ML SOPN Taking 1.2mg  daily       Assessment: 64 y.o. female with chest pain for heparin.  Previously on Xarelto for h/o iliac vein stent, DVT and thrombectomy, but stopped taking it "a while ago" per patient.   Goal of Therapy:  Heparin level 0.3-0.7 units/ml Monitor platelets by anticoagulation protocol: Yes   Plan:  Heparin 4000 units IV bolus,  then start heparin 1500 units/hr Check heparin level in 6 hours.   Caryl Pina 04/25/2019,11:07 PM   Addendum:   RN called to state that while starting heparin infusion, patient told her that she was still taking Xarelto, and her last dose was tonight at 8:30 pm.  Spoke with Dr. Sedonia Small, and will hold heparin until 24 hours after last Xarelto dose.  Phillis Knack, PharmD, BCPS 04/25/2019 11:49 PM

## 2019-04-25 NOTE — ED Notes (Signed)
Date and time results received: 04/25/19 2250  (use smartphrase ".now" to insert current time)  Test: trop  Critical Value: 890  Name of Provider Notified: Dr. Sedonia Small  Orders Received? Or Actions Taken?: See chart

## 2019-04-25 NOTE — ED Provider Notes (Signed)
[facility] Emergency Department Provider Note MRN:  [id number]  Arrival date & time: 04/25/19     Chief Complaint   Chest Pain   History of Present Illness   Amy Tran is a 64 y.o. year-old female with a history of diabetes, hypertension presenting to the ED with chief complaint of chest pain.  2 or 3 days of pressure in the chest, became severe today.  Associated with nausea and vomiting.  Also thinks that she had syncopal episode today.  Woke up on the ground not sure how.  Denies fever, no cough, no abdominal pain, no numbness or weakness.  Review of Systems  A complete 10 system review of systems was obtained and all systems are negative except as noted in the HPI and PMH.   Patient's Health History    Past Medical History:  Diagnosis Date  . Arthritis   . Asthma   . Cancer [facility])    multiple skin cancers  . Diabetes mellitus without complication ([place])   . Diverticulosis   . Fluid retention   . GERD (gastroesophageal reflux disease)   . Heart murmur   . High cholesterol   . Hypertension   . Incomplete rotator cuff tear   . OSA (obstructive sleep apnea)   . PONV (postoperative nausea and vomiting)   . Wears glasses   . Wears partial dentures    bottom    Past Surgical History:  Procedure Laterality Date  . APPENDECTOMY    . CARPAL TUNNEL RELEASE Bilateral 2001   bilateral  . CERVICAL POLYPECTOMY  04/08/2017   Procedure: POLYPECTOMY;  Surgeon: [name], MD;  Location: [facility] ORS;  Service: Gynecology;;  Endometrial  . CHOLECYSTECTOMY    . COLONOSCOPY  2007   [id number] internal hemorrhoids. Diminutive rectal polyp at 10 cm, cold biopsied/removed. The remainder of the rectal mucosa appeared normal Swallow left-sided diverticula. Diminutive polyp at the splenic flexure cold biopsied/removed (adenomatous)  . COLONOSCOPY N/A 12/28/2013   Procedure: COLONOSCOPY;  Surgeon: Daneil Dolin, MD;  Location: [facility] ENDO SUITE;  Service:  Endoscopy;  Laterality: N/A;  730 - moved to 8:30 - Ginger notified pt  . DILATION AND CURETTAGE, DIAGNOSTIC / THERAPEUTIC  03/07/2017  . ERCP  2002  . GASTRIC BYPASS  2004  . HYSTEROSCOPY WITH D & C N/A 04/08/2017   Procedure: DILATATION AND CURETTAGE /HYSTEROSCOPY;  Surgeon: [name], MD;  Location: [facility] ORS;  Service: Gynecology;  Laterality: N/A;  . INTRAVASCULAR ULTRASOUND/IVUS N/A 09/12/2017   Procedure: INTRAVASCULAR ULTRASOUND/IVUS;  Surgeon: Elam Dutch, MD;  Location: [place] CV LAB;  Service: Cardiovascular;  Laterality: N/A;  . NECK SURGERY  1998   fusion  . PERCUTANEOUS VENOUS THROMBECTOMY,LYSIS WITH INTRAVASCULAR ULTRASOUND (IVUS) Left 09/13/2017   Procedure: REMOVAL OF LYSIS CATHETER AND INTRAVASCULAR ULTRASOUND (IVUS) LEFT ILIAC VEIN;  Surgeon: Elam Dutch, MD;  Location: [place];  Service: Vascular;  Laterality: Left;  . PERIPHERAL VASCULAR INTERVENTION  09/15/2017   Procedure: PERIPHERAL VASCULAR INTERVENTION;  Surgeon: Waynetta Sandy, MD;  Location: [place] CV LAB;  Service: Cardiovascular;;  VEINOUS/STENT  . PERIPHERAL VASCULAR THROMBECTOMY Left 09/12/2017   Procedure: PERIPHERAL VASCULAR THROMBECTOMY;  Surgeon: Elam Dutch, MD;  Location: [place] [facility];  Service: Cardiovascular;  Laterality: Left;  . PERIPHERAL VASCULAR THROMBECTOMY Left 09/15/2017   Procedure: PERIPHERAL VASCULAR THROMBECTOMY;  Surgeon: Waynetta Sandy, MD;  Location: [place] [facility];  Service: Cardiovascular;  Laterality: Left;  IVC TO LT  FEM/POP VEIN  . SHOULDER ARTHROSCOPY WITH SUBACROMIAL DECOMPRESSION Left 06/23/2013   Procedure: LEFT SHOULDER ARTHROSCOPY WITH DEBRIDEMENT ROTATOR CUFF AND LABRUM;  Surgeon: Lorn Junes, MD;  Location: [place];  Service: Orthopedics;  Laterality: Left;  . SHOULDER SURGERY  1999   left    Family History  Problem Relation Age of Onset  . Diabetes Father   . Hypertension Father   . Parkinson's  disease Father   . Cancer Father        not sure what kind  . Hypertension Mother   . Heart disease Mother   . Drug abuse Sister   . Mesothelioma Brother   . Cancer Daughter        non-hodgkins lmphoma  . Hypertension Brother   . Diabetes Brother   . Colon cancer Neg Hx   . Gastric cancer Neg Hx   . Esophageal cancer Neg Hx     Social History   Socioeconomic History  . Marital status: Widowed    Spouse name: Not on file  . Number of children: 2  . Years of education: Not on file  . Highest education level: Not on file  Occupational History  . Occupation: works at [employer]: [employer]   Tobacco Use  . Smoking status: Current Every Day Smoker    Packs/day: 0.50    Years: 29.00    Pack years: 14.50    Types: Cigarettes    Start date: 04/09/1970  . Smokeless tobacco: Never Used  Substance and Sexual Activity  . Alcohol use: Not Currently    Comment: occ beer  . Drug use: No  . Sexual activity: Not Currently    Birth control/protection: Post-menopausal  Other Topics Concern  . Not on file  Social History Narrative  . Not on file   Social Determinants of Health   Financial Resource Strain:   . Difficulty of Paying Living Expenses:   Food Insecurity:   . Worried About Charity fundraiser in the Last Year:   . Arboriculturist in the Last Year:   Transportation Needs:   . Film/video editor (Medical):   Marland Kitchen Lack of Transportation (Non-Medical):   Physical Activity:   . Days of Exercise per Week:   . Minutes of Exercise per Session:   Stress:   . Feeling of Stress :   Social Connections:   . Frequency of Communication with Friends and Family:   . Frequency of Social Gatherings with Friends and Family:   . Attends Religious Services:   . Active Member of Clubs or Organizations:   . Attends Archivist Meetings:   Marland Kitchen Marital Status:   Intimate Partner Violence:   . Fear of Current or Ex-Partner:   . Emotionally Abused:   Marland Kitchen Physically Abused:    . Sexually Abused:      Physical Exam   Vitals:   04/25/19 2300 04/25/19 2330  BP: (!) 169/78 (!) 149/88  Pulse: 68 (!) 57  Resp: 20 15  Temp:    SpO2: 95% 95%    CONSTITUTIONAL: Well-appearing, NAD NEURO:  Alert and oriented x 3, normal and symmetric strength and sensation to the extremities, face, normal speech, normal coordination EYES:  eyes equal and reactive ENT/NECK:  no LAD, no JVD CARDIO: Regular rate, well-perfused, normal S1 and S2 PULM:  CTAB no wheezing or rhonchi GI/GU:  normal bowel sounds, non-distended, non-tender MSK/SPINE:  No gross deformities, no edema SKIN:  no rash, atraumatic PSYCH:  Appropriate speech and behavior  *Additional and/or pertinent findings included in MDM below  Diagnostic and Interventional Summary    EKG Interpretation  Date/Time:  Sunday April 25 2019 21:41:32 EDT Ventricular Rate:  67 PR Interval:    QRS Duration: 99 QT Interval:  414 QTC Calculation: 437 R Axis:   64 Text Interpretation: Atrial fibrillation Low voltage, precordial leads RSR' in V1 or V2, probably normal variant Nonspecific repol abnormality, lateral leads Confirmed by Gerlene Fee [phone]) on 04/25/2019 10:30:52 PM      Labs Reviewed  BASIC METABOLIC PANEL - Abnormal; Notable for the following components:      Result Value   Potassium 2.9 (*)    Chloride 97 (*)    Glucose, Bld 268 (*)    All other components within normal limits  CBC - Abnormal; Notable for the following components:   WBC 10.9 (*)    All other components within normal limits  TROPONIN I (HIGH SENSITIVITY) - Abnormal; Notable for the following components:   Troponin I (High Sensitivity) 890 (*)    All other components within normal limits  HEPARIN LEVEL (UNFRACTIONATED)  TROPONIN I (HIGH SENSITIVITY)    DG Chest 2 View  Final Result    DG Shoulder Right    (Results Pending)    Medications  heparin ADULT infusion 100 units/mL (25000 units/277mL sodium chloride 0.45%) (has no  administration in time range)  aspirin chewable tablet 324 mg (324 mg Oral Given 04/25/19 2329)  potassium chloride SA (KLOR-CON) CR tablet 40 mEq (40 mEq Oral Given 04/25/19 2329)     Procedures  /  Critical Care .Critical Care Performed by: Maudie Flakes, MD Authorized by: Maudie Flakes, MD   Critical care provider statement:    Critical care time (minutes):  35   Critical care was necessary to treat or prevent imminent or life-threatening deterioration of the following conditions: Acute coronary syndrome.   Critical care was time spent personally by me on the following activities:  Discussions with consultants, evaluation of patient's response to treatment, examination of patient, ordering and performing treatments and interventions, ordering and review of laboratory studies, ordering and review of radiographic studies, pulse oximetry, re-evaluation of patient's condition, obtaining history from patient or surrogate and review of old charts    ED Course and Medical Decision Making  I have reviewed the triage vital signs, the nursing notes, and pertinent available records from the EMR.  Pertinent labs & imaging results that were available during my care of the patient were reviewed by me and considered in my medical decision making (see below for details).     Concern for ACS versus PE, patient seems to have new onset A. fib, and will see any document of prior A. fib.  Work-up pending.  11 PM update: Case discussed with Dr. Hassell Done of cardiology, who feels that there is a strong likelihood that this is type II NSTEMI, and she is recommending internal medicine admission and cardiology following as a consultant.  Patient was asked about her compliance with Xarelto at home and gave a number of different answers, told me that she has been compliant.  She denies head trauma with the question of fall or syncopal episode, she denies any head soreness and I thoroughly examined her head and neck  for signs of trauma and did not find any.   Admitted to hospital service for further care.  Barth Kirks. Sedonia Small, [place]  [place] [email or web address]  Final Clinical Impressions(s) / ED Diagnoses     ICD-10-CM   1. Acute coronary syndrome [facility])  I24.9     ED Discharge Orders    None       Discharge Instructions Discussed with and Provided to Patient:   Discharge Instructions   None       Maudie Flakes, MD 04/25/19 2357

## 2019-04-25 NOTE — ED Triage Notes (Signed)
Patient c/o chest pain x 3 days. Nausea and vomiting. Patient states she woke up in the floor after lying down to sleep at 2045. C/o nausea and vomiting.

## 2019-04-26 DIAGNOSIS — Z7982 Long term (current) use of aspirin: Secondary | ICD-10-CM | POA: Diagnosis not present

## 2019-04-26 DIAGNOSIS — Z8249 Family history of ischemic heart disease and other diseases of the circulatory system: Secondary | ICD-10-CM | POA: Diagnosis not present

## 2019-04-26 DIAGNOSIS — F17200 Nicotine dependence, unspecified, uncomplicated: Secondary | ICD-10-CM | POA: Diagnosis not present

## 2019-04-26 DIAGNOSIS — Z7901 Long term (current) use of anticoagulants: Secondary | ICD-10-CM | POA: Diagnosis not present

## 2019-04-26 DIAGNOSIS — Z20822 Contact with and (suspected) exposure to covid-19: Secondary | ICD-10-CM | POA: Diagnosis present

## 2019-04-26 DIAGNOSIS — Z813 Family history of other psychoactive substance abuse and dependence: Secondary | ICD-10-CM | POA: Diagnosis not present

## 2019-04-26 DIAGNOSIS — Z809 Family history of malignant neoplasm, unspecified: Secondary | ICD-10-CM | POA: Diagnosis not present

## 2019-04-26 DIAGNOSIS — I4819 Other persistent atrial fibrillation: Secondary | ICD-10-CM | POA: Diagnosis not present

## 2019-04-26 DIAGNOSIS — I340 Nonrheumatic mitral (valve) insufficiency: Secondary | ICD-10-CM | POA: Diagnosis not present

## 2019-04-26 DIAGNOSIS — I214 Non-ST elevation (NSTEMI) myocardial infarction: Secondary | ICD-10-CM | POA: Diagnosis present

## 2019-04-26 DIAGNOSIS — E876 Hypokalemia: Secondary | ICD-10-CM

## 2019-04-26 DIAGNOSIS — I10 Essential (primary) hypertension: Secondary | ICD-10-CM | POA: Diagnosis present

## 2019-04-26 DIAGNOSIS — K5790 Diverticulosis of intestine, part unspecified, without perforation or abscess without bleeding: Secondary | ICD-10-CM | POA: Diagnosis present

## 2019-04-26 DIAGNOSIS — Z9884 Bariatric surgery status: Secondary | ICD-10-CM | POA: Diagnosis not present

## 2019-04-26 DIAGNOSIS — R079 Chest pain, unspecified: Secondary | ICD-10-CM | POA: Diagnosis present

## 2019-04-26 DIAGNOSIS — Z820 Family history of epilepsy and other diseases of the nervous system: Secondary | ICD-10-CM | POA: Diagnosis not present

## 2019-04-26 DIAGNOSIS — G4733 Obstructive sleep apnea (adult) (pediatric): Secondary | ICD-10-CM | POA: Diagnosis present

## 2019-04-26 DIAGNOSIS — E7800 Pure hypercholesterolemia, unspecified: Secondary | ICD-10-CM | POA: Diagnosis present

## 2019-04-26 DIAGNOSIS — Z955 Presence of coronary angioplasty implant and graft: Secondary | ICD-10-CM | POA: Diagnosis not present

## 2019-04-26 DIAGNOSIS — Z7984 Long term (current) use of oral hypoglycemic drugs: Secondary | ICD-10-CM | POA: Diagnosis not present

## 2019-04-26 DIAGNOSIS — I350 Nonrheumatic aortic (valve) stenosis: Secondary | ICD-10-CM | POA: Diagnosis not present

## 2019-04-26 DIAGNOSIS — E1151 Type 2 diabetes mellitus with diabetic peripheral angiopathy without gangrene: Secondary | ICD-10-CM | POA: Diagnosis present

## 2019-04-26 DIAGNOSIS — K219 Gastro-esophageal reflux disease without esophagitis: Secondary | ICD-10-CM | POA: Diagnosis present

## 2019-04-26 DIAGNOSIS — I200 Unstable angina: Secondary | ICD-10-CM | POA: Diagnosis not present

## 2019-04-26 DIAGNOSIS — I249 Acute ischemic heart disease, unspecified: Secondary | ICD-10-CM | POA: Diagnosis present

## 2019-04-26 DIAGNOSIS — M1990 Unspecified osteoarthritis, unspecified site: Secondary | ICD-10-CM | POA: Diagnosis present

## 2019-04-26 DIAGNOSIS — J45909 Unspecified asthma, uncomplicated: Secondary | ICD-10-CM | POA: Diagnosis present

## 2019-04-26 DIAGNOSIS — Z833 Family history of diabetes mellitus: Secondary | ICD-10-CM | POA: Diagnosis not present

## 2019-04-26 DIAGNOSIS — I480 Paroxysmal atrial fibrillation: Secondary | ICD-10-CM | POA: Diagnosis present

## 2019-04-26 DIAGNOSIS — Z85828 Personal history of other malignant neoplasm of skin: Secondary | ICD-10-CM | POA: Diagnosis not present

## 2019-04-26 DIAGNOSIS — E119 Type 2 diabetes mellitus without complications: Secondary | ICD-10-CM | POA: Diagnosis not present

## 2019-04-26 DIAGNOSIS — Z6841 Body Mass Index (BMI) 40.0 and over, adult: Secondary | ICD-10-CM | POA: Diagnosis not present

## 2019-04-26 DIAGNOSIS — I2510 Atherosclerotic heart disease of native coronary artery without angina pectoris: Secondary | ICD-10-CM | POA: Diagnosis present

## 2019-04-26 DIAGNOSIS — F17210 Nicotine dependence, cigarettes, uncomplicated: Secondary | ICD-10-CM | POA: Diagnosis present

## 2019-04-26 LAB — CBG MONITORING, ED
Glucose-Capillary: 171 mg/dL — ABNORMAL HIGH (ref 70–99)
Glucose-Capillary: 160 mg/dL — ABNORMAL HIGH (ref 70–99)

## 2019-04-26 LAB — HIV ANTIBODY (ROUTINE TESTING W REFLEX): HIV Screen 4th Generation wRfx: NONREACTIVE

## 2019-04-26 LAB — BASIC METABOLIC PANEL
Anion gap: 7 (ref 5–15)
BUN: 12 mg/dL (ref 8–23)
CO2: 28 mmol/L (ref 22–32)
Calcium: 9.1 mg/dL (ref 8.9–10.3)
Chloride: 101 mmol/L (ref 98–111)
Creatinine, Ser: 0.68 mg/dL (ref 0.44–1.00)
GFR calc Af Amer: >60 mL/min (ref >60)
GFR calc non Af Amer: >60 mL/min (ref >60)
Glucose, Bld: 166 mg/dL — ABNORMAL HIGH (ref 70–99)
Potassium: 3 mmol/L — ABNORMAL LOW (ref 3.5–5.1)
Sodium: 136 mmol/L (ref 135–145)

## 2019-04-26 LAB — LIPID PANEL
Cholesterol: 174 mg/dL (ref 0–200)
HDL: 41 mg/dL (ref >40)
LDL Cholesterol: 111 mg/dL — ABNORMAL HIGH (ref 0–99)
Total CHOL/HDL Ratio: 4.2 RATIO
Triglycerides: 110 mg/dL (ref <150)
VLDL: 22 mg/dL (ref 0–40)

## 2019-04-26 LAB — APTT: aPTT: 30 seconds (ref 24–36)

## 2019-04-26 LAB — PROTIME-INR
INR: 1.6 — ABNORMAL HIGH (ref 0.8–1.2)
Prothrombin Time: 18.9 seconds — ABNORMAL HIGH (ref 11.4–15.2)

## 2019-04-26 LAB — TROPONIN I (HIGH SENSITIVITY)
Troponin I (High Sensitivity): 820 ng/L (ref <18)
Troponin I (High Sensitivity): 2320 ng/L (ref <18)
Troponin I (High Sensitivity): 6761 ng/L (ref <18)

## 2019-04-26 LAB — HEPARIN LEVEL (UNFRACTIONATED)
Heparin Unfractionated: >2.2 IU/mL — ABNORMAL HIGH (ref 0.30–0.70)
Heparin Unfractionated: >2.2 IU/mL — ABNORMAL HIGH (ref 0.30–0.70)

## 2019-04-26 LAB — SARS CORONAVIRUS 2 (TAT 6-24 HRS): SARS Coronavirus 2: NEGATIVE

## 2019-04-26 MED ORDER — SIMVASTATIN 20 MG PO TABS
20.0000 mg | ORAL_TABLET | Freq: Every day | ORAL | Status: DC
  Administered 2019-04-26: 20 mg via ORAL
  Filled 2019-04-26: qty 1

## 2019-04-26 MED ORDER — POTASSIUM CHLORIDE CRYS ER 20 MEQ PO TBCR
40.0000 meq | EXTENDED_RELEASE_TABLET | Freq: Every day | ORAL | Status: DC
  Administered 2019-04-26: 40 meq via ORAL
  Filled 2019-04-26: qty 2

## 2019-04-26 MED ORDER — ACETAMINOPHEN 325 MG PO TABS
650.0000 mg | ORAL_TABLET | ORAL | Status: DC | PRN
  Administered 2019-04-26: 650 mg via ORAL
  Filled 2019-04-26: qty 2

## 2019-04-26 MED ORDER — NITROGLYCERIN 0.4 MG SL SUBL: 0.4000 mg | SUBLINGUAL_TABLET | SUBLINGUAL | Status: DC | PRN

## 2019-04-26 MED ORDER — INSULIN ASPART 100 UNIT/ML ~~LOC~~ SOLN
0.0000 [IU] | Freq: Three times a day (TID) | SUBCUTANEOUS | Status: DC
  Administered 2019-04-27: 2 [IU] via SUBCUTANEOUS
  Administered 2019-04-27: 3 [IU] via SUBCUTANEOUS
  Administered 2019-04-27: 5 [IU] via SUBCUTANEOUS
  Administered 2019-04-28 (×2): 3 [IU] via SUBCUTANEOUS

## 2019-04-26 MED ORDER — POTASSIUM CHLORIDE CRYS ER 20 MEQ PO TBCR
40.0000 meq | EXTENDED_RELEASE_TABLET | Freq: Two times a day (BID) | ORAL | Status: AC
  Administered 2019-04-26 – 2019-04-27 (×2): 40 meq via ORAL
  Filled 2019-04-26 (×2): qty 2

## 2019-04-26 MED ORDER — TORSEMIDE 20 MG PO TABS
60.0000 mg | ORAL_TABLET | Freq: Every day | ORAL | Status: DC
  Administered 2019-04-26 – 2019-04-28 (×2): 60 mg via ORAL
  Filled 2019-04-26 (×5): qty 3

## 2019-04-26 MED ORDER — INSULIN ASPART 100 UNIT/ML ~~LOC~~ SOLN: 0.0000 [IU] | Freq: Every day | SUBCUTANEOUS | Status: DC

## 2019-04-26 MED ORDER — AMLODIPINE BESYLATE 5 MG PO TABS
5.0000 mg | ORAL_TABLET | Freq: Every day | ORAL | Status: DC
  Administered 2019-04-26 – 2019-04-28 (×2): 5 mg via ORAL
  Filled 2019-04-26 (×2): qty 1

## 2019-04-26 MED ORDER — ONDANSETRON HCL 4 MG/2ML IJ SOLN: 4.0000 mg | Freq: Four times a day (QID) | INTRAMUSCULAR | Status: DC | PRN

## 2019-04-26 MED ORDER — SPIRONOLACTONE 25 MG PO TABS
25.0000 mg | ORAL_TABLET | Freq: Every day | ORAL | Status: DC
  Administered 2019-04-26 – 2019-04-28 (×2): 25 mg via ORAL
  Filled 2019-04-26 (×5): qty 1

## 2019-04-26 MED ORDER — LOSARTAN POTASSIUM 50 MG PO TABS
100.0000 mg | ORAL_TABLET | Freq: Every day | ORAL | Status: DC
  Administered 2019-04-26 – 2019-04-28 (×2): 100 mg via ORAL
  Filled 2019-04-26 (×2): qty 2

## 2019-04-26 MED ORDER — ASPIRIN EC 81 MG PO TBEC
81.0000 mg | DELAYED_RELEASE_TABLET | Freq: Every day | ORAL | Status: DC
  Administered 2019-04-26: 81 mg via ORAL
  Filled 2019-04-26: qty 1

## 2019-04-26 NOTE — H&P (Signed)
History and Physical    NIMCO MCHENRY [id number] DOB: [birth year] DOA: 04/25/2019  PCP: Asencion Noble, MD (Confirm with patient/family/NH records and if not entered, this has to be entered at [facility] point of entry) Patient coming from: Home  I have personally briefly reviewed patient's old medical records in [place]  Chief Complaint: Chest pain  HPI: Amy Tran is a 64 y.o. female with medical history significant of hypertension, hyperlipidemia, atrial fibrillation on Xarelto, diabetes mellitus and asthma presented to ED for evaluation of chest pain.  Patient states that she is started having chest pain for the last 2 to 3 days.  Chest pain is located in the center of chest, severe tightness in nature, 7 out of 10 on pain scale when it is worse, episodic and stays for 20 to 25 minutes, improved with nothing while getting worse with exertion.  Chest pain is associated with mild dizziness but no dyspnea or diaphoresis.  Patient denies fever, chills, sore throat, cough, GERD, nausea, vomiting, abdominal pain, recent injury to the chest and recent travel history.  Patient admits of smoking 1 pack of cigarettes daily.  ED Course: On arrival to the ED and had blood pressure of 169/78, heart rate 68, respiratory rate 20 and oxygen saturation 95% on room air.  Blood work showed potassium of 2.9, blood glucose 268 and troponin of 890.  Chest x-ray showed mild bronchial thickness but no acute cardiopulmonary pathology.  EKG showed atrial fibrillation with a heart rate of 67, and was negative for acute ischemic changes.  Patient was given aspirin in the ED.  ED physician contacted Dr. 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  Review of Systems: As per HPI otherwise 10 point review of systems negative.    Past Medical History:  Diagnosis Date  . Arthritis   . Asthma   . Cancer [facility])    multiple skin cancers  . Diabetes  mellitus without complication ([place])   . Diverticulosis   . Fluid retention   . GERD (gastroesophageal reflux disease)   . Heart murmur   . High cholesterol   . Hypertension   . Incomplete rotator cuff tear   . OSA (obstructive sleep apnea)   . PONV (postoperative nausea and vomiting)   . Wears glasses   . Wears partial dentures    bottom    Past Surgical History:  Procedure Laterality Date  . APPENDECTOMY    . CARPAL TUNNEL RELEASE Bilateral 2001   bilateral  . CERVICAL POLYPECTOMY  04/08/2017   Procedure: POLYPECTOMY;  Surgeon: [name], MD;  Location: [facility] ORS;  Service: Gynecology;;  Endometrial  . CHOLECYSTECTOMY    . COLONOSCOPY  2007   [id number] internal hemorrhoids. Diminutive rectal polyp at 10 cm, cold biopsied/removed. The remainder of the rectal mucosa appeared normal Swallow left-sided diverticula. Diminutive polyp at the splenic flexure cold biopsied/removed (adenomatous)  . COLONOSCOPY N/A 12/28/2013   Procedure: COLONOSCOPY;  Surgeon: Daneil Dolin, MD;  Location: [facility] ENDO SUITE;  Service: Endoscopy;  Laterality: N/A;  730 - moved to 8:30 - Ginger notified pt  . DILATION AND CURETTAGE, DIAGNOSTIC / THERAPEUTIC  03/07/2017  . ERCP  2002  . GASTRIC BYPASS  2004  . HYSTEROSCOPY WITH D & C N/A 04/08/2017   Procedure: DILATATION AND CURETTAGE /HYSTEROSCOPY;  Surgeon: [name], MD;  Location: [facility] ORS;  Service: Gynecology;  Laterality: N/A;  . INTRAVASCULAR ULTRASOUND/IVUS N/A 09/12/2017  Procedure: INTRAVASCULAR ULTRASOUND/IVUS;  Surgeon: Elam Dutch, MD;  Location: [place] CV LAB;  Service: Cardiovascular;  Laterality: N/A;  . NECK SURGERY  1998   fusion  . PERCUTANEOUS VENOUS THROMBECTOMY,LYSIS WITH INTRAVASCULAR ULTRASOUND (IVUS) Left 09/13/2017   Procedure: REMOVAL OF LYSIS CATHETER AND INTRAVASCULAR ULTRASOUND (IVUS) LEFT ILIAC VEIN;  Surgeon: Elam Dutch, MD;  Location: [place];  Service: Vascular;  Laterality: Left;  . PERIPHERAL VASCULAR  INTERVENTION  09/15/2017   Procedure: PERIPHERAL VASCULAR INTERVENTION;  Surgeon: Waynetta Sandy, MD;  Location: [place] CV LAB;  Service: Cardiovascular;;  VEINOUS/STENT  . PERIPHERAL VASCULAR THROMBECTOMY Left 09/12/2017   Procedure: PERIPHERAL VASCULAR THROMBECTOMY;  Surgeon: Elam Dutch, MD;  Location: [place] CV LAB;  Service: Cardiovascular;  Laterality: Left;  . PERIPHERAL VASCULAR THROMBECTOMY Left 09/15/2017   Procedure: PERIPHERAL VASCULAR THROMBECTOMY;  Surgeon: Waynetta Sandy, MD;  Location: [place] CV LAB;  Service: Cardiovascular;  Laterality: Left;  IVC TO LT FEM/POP VEIN  . SHOULDER ARTHROSCOPY WITH SUBACROMIAL DECOMPRESSION Left 06/23/2013   Procedure: LEFT SHOULDER ARTHROSCOPY WITH DEBRIDEMENT ROTATOR CUFF AND LABRUM;  Surgeon: Lorn Junes, MD;  Location: [place];  Service: Orthopedics;  Laterality: Left;  . [place]   left     reports that she has been smoking cigarettes. She started smoking about 49 years ago. She has a 14.50 pack-year smoking history. She has never used smokeless tobacco. She reports previous alcohol use. She reports that she does not use drugs.  Allergies  Allergen Reactions  . Penicillins Hives and Rash    Has patient had a PCN reaction causing immediate rash, facial/tongue/throat swelling, SOB or lightheadedness with hypotension: Yes Has patient had a PCN reaction causing severe rash involving mucus membranes or skin necrosis: No Has patient had a PCN reaction that required hospitalization No Has patient had a PCN reaction occurring within the last 10 years: No If all of the above answers are "NO", then may proceed with Cephalosporin use.     Family History  Problem Relation Age of Onset  . Diabetes Father   . Hypertension Father   . Parkinson's disease Father   . Cancer Father        not sure what kind  . Hypertension Mother   . Heart disease Mother   . Drug abuse Sister     . Mesothelioma Brother   . Cancer Daughter        non-hodgkins lmphoma  . Hypertension Brother   . Diabetes Brother   . Colon cancer Neg Hx   . Gastric cancer Neg Hx   . Esophageal cancer Neg Hx     Unacceptable: Noncontributory, unremarkable, or negative. Acceptable: (example)Family history negative for heart disease  Prior to Admission medications   Medication Sig Start Date End Date Taking? Authorizing Provider  ALBUTEROL IN Inhale into the lungs as needed.    [provider]  amLODipine (NORVASC) 5 MG tablet Take 5 mg by mouth daily.    [provider]  aspirin EC 81 MG tablet Take 81 mg by mouth.     [provider]  BD PEN NEEDLE NANO U/F 32G X 4 MM MISC USE FOR [place] INJECTIONS 08/03/18   [provider]  benzonatate (TESSALON) 100 MG capsule Take 1 capsule (100 mg total) by mouth every 8 (eight) hours. 01/04/19   Corena Herter, PA-C  canagliflozin (INVOKANA) 300 MG TABS tablet Take 300 mg by mouth daily before breakfast.  [provider]  Cyanocobalamin (VITAMIN B-12 PO) Take by mouth daily.    [provider]  doxycycline (VIBRAMYCIN) 100 MG capsule Take 1 capsule (100 mg total) by mouth 2 (two) times daily. 12/03/18   Nickel, Sharmon Leyden, NP  fluticasone (FLONASE) 50 MCG/ACT nasal spray Place 2 sprays into both nostrils as needed.  06/29/18   [provider]  glipiZIDE (GLUCOTROL) 5 MG tablet Take 5 mg by mouth daily before breakfast.     [provider]  levocetirizine (XYZAL) 5 MG tablet Take 5 mg by mouth daily. 06/29/18   [provider]  losartan (COZAAR) 100 MG tablet Take 100 mg by mouth daily.    [provider]  Magnesium Oxide 500 MG TABS Take by mouth. 05/26/14   [provider]  metFORMIN (GLUCOPHAGE) 1000 MG tablet Take 1,000 mg by mouth daily with breakfast.     [provider]  pantoprazole (PROTONIX) 40 MG tablet Take 40 mg by mouth daily.    [provider]  potassium chloride SA (K-DUR,KLOR-CON) 20 MEQ tablet Take 1 tablet (20 mEq total) by mouth daily. Patient taking differently: Take 20 mEq by mouth every other day.  09/16/17   Rhyne, Hulen Shouts, PA-C  rivaroxaban (XARELTO) 20 MG TABS tablet Take 1 tablet (20 mg total) by mouth daily with supper. 09/17/18   Elam Dutch, MD  simvastatin (ZOCOR) 20 MG tablet Take 20 mg by mouth at bedtime.  07/29/14   [provider]  spironolactone (ALDACTONE) 25 MG tablet Take 25 mg by mouth daily.    [provider]  torsemide (DEMADEX) 20 MG tablet TAKE 2 TABS IN AM AND 1 TAB IN PM Patient taking differently: Take 60 mg by mouth daily.  02/02/16   Arnoldo Lenis, MD  VICTOZA 18 MG/3ML SOPN Taking 1.2mg  daily 08/04/18   [provider]    Physical Exam: Vitals:   04/26/19 0430 04/26/19 0500 04/26/19 0530 04/26/19 0600  BP: 132/82 128/70 120/76 121/74  Pulse: 68 62 60 (!) 57  Resp: 19 18 16 18   Temp:      TempSrc:      SpO2: 98% 97% 97% 100%  Weight:      Height:        Constitutional: NAD, calm, comfortable Vitals:   04/26/19 0430 04/26/19 0500 04/26/19 0530 04/26/19 0600  BP: 132/82 128/70 120/76 121/74  Pulse: 68 62 60 (!) 57  Resp: 19 18 16 18   Temp:      TempSrc:      SpO2: 98% 97% 97% 100%  Weight:      Height:       Eyes: PERRL, lids and conjunctivae normal ENMT: Mucous membranes are moist. Posterior pharynx clear of any exudate or lesions.Normal dentition.  Neck: normal, supple, no masses, no thyromegaly Respiratory: clear to auscultation bilaterally, no wheezing, no crackles. Normal respiratory effort. No accessory muscle use.  Cardiovascular: Regular rate and rhythm, no murmurs / rubs / gallops. No extremity edema. 2+ pedal pulses. No carotid bruits.  Abdomen: no tenderness, no masses palpated. No hepatosplenomegaly. Bowel sounds positive.  Musculoskeletal: no clubbing / cyanosis. No joint deformity upper and lower extremities. Good  ROM, no contractures. Normal muscle tone.  Skin: no rashes, lesions, ulcers. No induration Neurologic: CN 2-12 grossly intact. Sensation intact, DTR normal. Strength 5/5 in all 4.  Psychiatric: Normal judgment and insight. Alert and oriented x 3. Normal mood.   Labs on Admission: I have personally reviewed following  labs and imaging studies  CBC: Recent Labs  Lab 04/25/19 2155  WBC 10.9*  HGB 12.8  HCT 40.7  MCV 85.3  PLT 99991111   Basic Metabolic Panel: Recent Labs  Lab 04/25/19 2155  NA 135  K 2.9*  CL 97*  CO2 27  GLUCOSE 268*  BUN 15  CREATININE 0.92  CALCIUM 9.3   GFR: Estimated Creatinine Clearance: 80.8 mL/min (by C-G formula based on SCr of 0.92 mg/dL). Liver Function Tests: No results for input(s): AST, ALT, ALKPHOS, BILITOT, PROT, ALBUMIN in the last 168 hours. No results for input(s): LIPASE, AMYLASE in the last 168 hours. No results for input(s): AMMONIA in the last 168 hours. Coagulation Profile: No results for input(s): INR, PROTIME in the last 168 hours. Cardiac Enzymes: No results for input(s): CKTOTAL, CKMB, CKMBINDEX, TROPONINI in the last 168 hours. BNP (last 3 results) No results for input(s): PROBNP in the last 8760 hours. HbA1C: No results for input(s): HGBA1C in the last 72 hours. CBG: No results for input(s): GLUCAP in the last 168 hours. Lipid Profile: Recent Labs    04/26/19 0010  CHOL 174  HDL 41  LDLCALC 111*  TRIG 110  CHOLHDL 4.2   Thyroid Function Tests: No results for input(s): TSH, T4TOTAL, FREET4, T3FREE, THYROIDAB in the last 72 hours. Anemia Panel: No results for input(s): VITAMINB12, FOLATE, FERRITIN, TIBC, IRON, RETICCTPCT in the last 72 hours. Urine analysis:    Component Value Date/Time   COLORURINE YELLOW 04/02/2017 1325   APPEARANCEUR HAZY (A) 04/02/2017 1325   LABSPEC 1.033 (H) 04/02/2017 1325   PHURINE 5.0 04/02/2017 1325   GLUCOSEU >=500 (A) 04/02/2017 1325   HGBUR LARGE (A) 04/02/2017 1325   BILIRUBINUR  NEGATIVE 04/02/2017 1325   KETONESUR NEGATIVE 04/02/2017 1325   PROTEINUR 30 (A) 04/02/2017 1325   NITRITE NEGATIVE 04/02/2017 1325   LEUKOCYTESUR NEGATIVE 04/02/2017 1325    Radiological Exams on Admission: DG Chest 2 View  Result Date: 04/25/2019 CLINICAL DATA:  Chest pain for 3 days. EXAM: CHEST - 2 VIEW COMPARISON:  01/03/2019 FINDINGS: The cardiomediastinal contours are normal. Mild bronchial thickening. Pulmonary vasculature is normal. No consolidation, pleural effusion, or pneumothorax. No acute osseous abnormalities are seen. Surgical hardware in the lower cervical spine is partially included. IMPRESSION: Mild bronchial thickening. Electronically Signed   By: Keith Rake M.D.   On: 04/25/2019 22:33   DG Shoulder Right  Result Date: 04/26/2019 CLINICAL DATA:  Shoulder pain after fall EXAM: RIGHT SHOULDER - 2+ VIEW COMPARISON:  None. FINDINGS: No acute fracture or dislocation of the right shoulder. There is mild right acromioclavicular osteoarthrosis. IMPRESSION: Negative. Electronically Signed   By: Ulyses Jarred M.D.   On: 04/26/2019 00:42     Assessment/Plan Principal Problem:    Chest pain Patient admitted to [facility] with continuous telemetry monitoring. Cardiology consult ordered by the ED physician and cardiology think that patient has NSTEMI. Continue to trend the troponin Nitroglycerin sublingual for chest pain as needed Patient took on Xarelto and heparin drip will be started at least 24-hour after the last dose of Xarelto. N.p.o. till evaluated by cardiology.  Active Problems:   Hypokalemia Potassium repleted and continue to monitor potassium level.    Tobacco dependence Tobacco cessation counseling done in detail.     DVT prophylaxis: Patient took her home Xarelto tonight and will be started on heparin drip 24 hours after the last dose of Xarelto. Code Status: Full code Consults called: Cardiology Admission status: Observation/cardiac  telemetry  Edmonia Lynch MD [facility] Pager [phone]   If 7PM-7AM, please contact night-coverage [email or web address] Password   04/26/2019, 6:25 AM

## 2019-04-26 NOTE — Progress Notes (Signed)
ANTICOAGULATION CONSULT NOTE -  Pharmacy Consult for Heparin Indication: chest pain/ACS  Allergies  Allergen Reactions  . Penicillins Hives and Rash    Has patient had a PCN reaction causing immediate rash, facial/tongue/throat swelling, SOB or lightheadedness with hypotension: Yes Has patient had a PCN reaction causing severe rash involving mucus membranes or skin necrosis: No Has patient had a PCN reaction that required hospitalization No Has patient had a PCN reaction occurring within the last 10 years: No If all of the above answers are "NO", then may proceed with Cephalosporin use.     Patient Measurements: Height: 5\' 4"  (162.6 cm) Weight: 276 lb (125.2 kg) IBW/kg (Calculated) : 54.7 Heparin Dosing Weight: 85 kg  Vital Signs: Temp: 97.9 F (36.6 C) (03/14 2140) Temp Source: Oral (03/14 2140) BP: 130/83 (03/15 0700) Pulse Rate: 60 (03/15 0700)  Labs: Recent Labs    04/25/19 2155 04/25/19 2155 04/26/19 0010 04/26/19 0242 04/26/19 0538  HGB 12.8  --   --   --   --   HCT 40.7  --   --   --   --   PLT 302  --   --   --   --   APTT  --   --   --   --  30  LABPROT  --   --   --   --  18.9*  INR  --   --   --   --  1.6*  HEPARINUNFRC  --   --   --   --  3.12*  CREATININE 0.92  --   --   --   --   TROPONINIHS 890*   < > 820* 2,320* 6,761*   < > = values in this interval not displayed.    Estimated Creatinine Clearance: 80.8 mL/min (by C-G formula based on SCr of 0.92 mg/dL).   Medical History: Past Medical History:  Diagnosis Date  . Arthritis   . Asthma   . Cancer [facility])    multiple skin cancers  . Diabetes mellitus without complication ([place])   . Diverticulosis   . Fluid retention   . GERD (gastroesophageal reflux disease)   . Heart murmur   . High cholesterol   . Hypertension   . Incomplete rotator cuff tear   . OSA (obstructive sleep apnea)   . PONV (postoperative nausea and vomiting)   . Wears glasses   . Wears partial dentures    bottom     Medications:  No current facility-administered medications on file prior to encounter.   Current Outpatient Medications on File Prior to Encounter  Medication Sig Dispense Refill  . ALBUTEROL IN Inhale into the lungs as needed.    Marland Kitchen amLODipine (NORVASC) 5 MG tablet Take 5 mg by mouth daily.    Marland Kitchen aspirin EC 81 MG tablet Take 81 mg by mouth.     . BD PEN NEEDLE NANO U/F 32G X 4 MM MISC USE FOR VICTOZA INJECTIONS    . benzonatate (TESSALON) 100 MG capsule Take 1 capsule (100 mg total) by mouth every 8 (eight) hours. 21 capsule 0  . canagliflozin (INVOKANA) 300 MG TABS tablet Take 300 mg by mouth daily before breakfast.    . Cyanocobalamin (VITAMIN B-12 PO) Take by mouth daily.    Marland Kitchen doxycycline (VIBRAMYCIN) 100 MG capsule Take 1 capsule (100 mg total) by mouth 2 (two) times daily. 28 capsule 0  . fluticasone (FLONASE) 50 MCG/ACT nasal spray Place 2 sprays into both nostrils  as needed.     Marland Kitchen glipiZIDE (GLUCOTROL) 5 MG tablet Take 5 mg by mouth daily before breakfast.     . levocetirizine (XYZAL) 5 MG tablet Take 5 mg by mouth daily.    Marland Kitchen losartan (COZAAR) 100 MG tablet Take 100 mg by mouth daily.    . Magnesium Oxide 500 MG TABS Take by mouth.    . metFORMIN (GLUCOPHAGE) 1000 MG tablet Take 1,000 mg by mouth daily with breakfast.     . pantoprazole (PROTONIX) 40 MG tablet Take 40 mg by mouth daily.    . potassium chloride SA (K-DUR,KLOR-CON) 20 MEQ tablet Take 1 tablet (20 mEq total) by mouth daily. (Patient taking differently: Take 20 mEq by mouth every other day. )    . rivaroxaban (XARELTO) 20 MG TABS tablet Take 1 tablet (20 mg total) by mouth daily with supper. 90 tablet 3  . simvastatin (ZOCOR) 20 MG tablet Take 20 mg by mouth at bedtime.     Marland Kitchen spironolactone (ALDACTONE) 25 MG tablet Take 25 mg by mouth daily.    Marland Kitchen torsemide (DEMADEX) 20 MG tablet TAKE 2 TABS IN AM AND 1 TAB IN PM (Patient taking differently: Take 60 mg by mouth daily. ) 90 tablet 3  . VICTOZA 18 MG/3ML SOPN Taking  1.2mg  daily       Assessment: 64 y.o. female with chest pain for heparin.  Previously on Xarelto for h/o iliac vein stent, DVT and thrombectomy. Initially, pharmacy wast told she stopped taking her xarelto but later told RN that she had taken her xarelto 3/14 at 2030. (Dr. Sedonia Small was informed and heparin held)  Goal of Therapy:  Heparin level 0.3-0.7 units/ml Monitor platelets by anticoagulation protocol: Yes   Plan:  Start heparin at 1200 units/hr at 2030 3/15 Check heparin level/aPTT in 6 hours and daily  Monitor for S/S of bleeding  Isac Sarna, BS Vena [place], BCPS Clinical Pharmacist Pager [phone] 04/26/2019,7:43 AM

## 2019-04-26 NOTE — ED Notes (Signed)
Spoke with pt daughter Larene Beach and provided update

## 2019-04-26 NOTE — Progress Notes (Signed)
PROGRESS NOTE    Amy Tran  [id number] DOB: May 30, 1955 DOA: 04/25/2019 PCP: Asencion Noble, MD      Brief Narrative:  Amy Tran is a 64 y.o. F with DM, HTN, DVT on Xarelto, and obesity who presented with chest pain.  Found to have NSTEMI.      Assessment & Plan:  NSTEMI -Continue aspirin, simvastatin -Continue heparin gtt  Hypertension -Continue amlodipine, spironolactone, torsemide, losartan, K  Diabetes -Start SS correction insulin -Check A1c -Hold Victoza  History DVT -Hold Xarelto  New onset atrial fibrillation, paroxysmal CHA2DS2-Vasc at least 4.  Rate very slow. -Resume Xarelto lifelong  Hypokalemia -Supplement K       Disposition: The patient was admitted with NSTEMI.   I will discharge when this is stented.        MDM: This is a no charge note.  For further details, please see H&P by my partner Dr. Humphrey Rolls from earlier today.  The below labs and imaging reports were reviewed and summarized above.    DVT prophylaxis: N/A on heparin Code Status: FULL Family Communication:     Consultants:   Cardiology  Procedures:     Antimicrobials:      Culture data:              Subjective: Feeling well.  Hungry        Objective: Vitals:   04/26/19 1630 04/26/19 1645 04/26/19 1700 04/26/19 1715  BP: 101/71 132/68 130/75 112/69  Pulse: (!) 49 (!) 59 76 (!) 58  Resp: 17 20 15 20   Temp:      TempSrc:      SpO2: 100% 98% 91% 100%  Weight:      Height:        Intake/Output Summary (Last 24 hours) at 04/26/2019 1758 Last data filed at 04/26/2019 1350 Gross per 24 hour  Intake --  Output 1200 ml  Net -1200 ml   Filed Weights   04/25/19 2132  Weight: 125.2 kg    Examination: The patient was seen and examined.      Data Reviewed: I have personally reviewed following labs and imaging studies:  CBC: Recent Labs  Lab 04/25/19 2155  WBC 10.9*  HGB 12.8  HCT 40.7  MCV 85.3  PLT 99991111   Basic  Metabolic Panel: Recent Labs  Lab 04/25/19 2155 04/26/19 1008  NA 135 136  K 2.9* 3.0*  CL 97* 101  CO2 27 28  GLUCOSE 268* 166*  BUN 15 12  CREATININE 0.92 0.68  CALCIUM 9.3 9.1   GFR: Estimated Creatinine Clearance: 93 mL/min (by C-G formula based on SCr of 0.68 mg/dL). Liver Function Tests: No results for input(s): AST, ALT, ALKPHOS, BILITOT, PROT, ALBUMIN in the last 168 hours. No results for input(s): LIPASE, AMYLASE in the last 168 hours. No results for input(s): AMMONIA in the last 168 hours. Coagulation Profile: Recent Labs  Lab 04/26/19 0538  INR 1.6*   Cardiac Enzymes: No results for input(s): CKTOTAL, CKMB, CKMBINDEX, TROPONINI in the last 168 hours. BNP (last 3 results) No results for input(s): PROBNP in the last 8760 hours. HbA1C: No results for input(s): HGBA1C in the last 72 hours. CBG: No results for input(s): GLUCAP in the last 168 hours. Lipid Profile: Recent Labs    04/26/19 0010  CHOL 174  HDL 41  LDLCALC 111*  TRIG 110  CHOLHDL 4.2   Thyroid Function Tests: No results for input(s): TSH, T4TOTAL, FREET4, T3FREE, THYROIDAB in the last 72 hours. Anemia  Panel: No results for input(s): VITAMINB12, FOLATE, FERRITIN, TIBC, IRON, RETICCTPCT in the last 72 hours. Urine analysis:    Component Value Date/Time   COLORURINE YELLOW 04/02/2017 1325   APPEARANCEUR HAZY (A) 04/02/2017 1325   LABSPEC 1.033 (H) 04/02/2017 1325   PHURINE 5.0 04/02/2017 1325   GLUCOSEU >=500 (A) 04/02/2017 1325   HGBUR LARGE (A) 04/02/2017 1325   BILIRUBINUR NEGATIVE 04/02/2017 1325   KETONESUR NEGATIVE 04/02/2017 1325   PROTEINUR 30 (A) 04/02/2017 1325   NITRITE NEGATIVE 04/02/2017 1325   LEUKOCYTESUR NEGATIVE 04/02/2017 1325   Sepsis Labs: @LABRCNTIP (procalcitonin:4,lacticacidven:4)  ) Recent Results (from the past 240 hour(s))  SARS CORONAVIRUS 2 (TAT 6-24 HRS) Nasopharyngeal Nasopharyngeal Swab     Status: None   Collection Time: 04/25/19 11:57 PM   Specimen:  Nasopharyngeal Swab  Result Value Ref Range Status   SARS Coronavirus 2 NEGATIVE NEGATIVE Final    Comment: (NOTE) SARS-CoV-2 target nucleic acids are NOT DETECTED. The SARS-CoV-2 RNA is generally detectable in upper and lower respiratory specimens during the acute phase of infection. Negative results do not preclude SARS-CoV-2 infection, do not rule out co-infections with other pathogens, and should not be used as the sole basis for treatment or other patient management decisions. Negative results must be combined with clinical observations, patient history, and epidemiological information. The expected result is Negative. Fact Sheet for Patients: SugarRoll.be Fact Sheet for Healthcare Providers: https://www.woods-mathews.com/ This test is not yet approved or cleared by the [place] FDA and  has been authorized for detection and/or diagnosis of SARS-CoV-2 by FDA under an Emergency Use Authorization (EUA). This EUA will remain  in effect (meaning this test can be used) for the duration of the COVID-19 declaration under Section 56 4(b)(1) of the Act, 21 U.S.C. section 360bbb-3(b)(1), unless the authorization is terminated or revoked sooner. Performed at [facility], [place] [address] [place], [place] [address]          Radiology Studies: DG Chest 2 View  Result Date: 04/25/2019 CLINICAL DATA:  Chest pain for 3 days. EXAM: CHEST - 2 VIEW COMPARISON:  01/03/2019 FINDINGS: The cardiomediastinal contours are normal. Mild bronchial thickening. Pulmonary vasculature is normal. No consolidation, pleural effusion, or pneumothorax. No acute osseous abnormalities are seen. Surgical hardware in the lower cervical spine is partially included. IMPRESSION: Mild bronchial thickening. Electronically Signed   By: Keith Rake M.D.   On: 04/25/2019 22:33   DG Shoulder Right  Result Date: 04/26/2019 CLINICAL DATA:  Shoulder pain after fall  EXAM: RIGHT SHOULDER - 2+ VIEW COMPARISON:  None. FINDINGS: No acute fracture or dislocation of the right shoulder. There is mild right acromioclavicular osteoarthrosis. IMPRESSION: Negative. Electronically Signed   By: Ulyses Jarred M.D.   On: 04/26/2019 00:42        Scheduled Meds: . amLODipine  5 mg Oral Daily  . aspirin EC  81 mg Oral Daily  . losartan  100 mg Oral Daily  . potassium chloride  40 mEq Oral Daily  . simvastatin  20 mg Oral QHS  . spironolactone  25 mg Oral Daily  . torsemide  60 mg Oral Daily   Continuous Infusions: . heparin 1,200 Units/hr (04/26/19 0826)     LOS: 0 days    Time spent: 34minutes    Edwin Dada, MD [facility] 04/26/2019, 5:58 PM     Please page though [place] or Epic secure chat:  For password, contact charge nurse

## 2019-04-26 NOTE — ED Provider Notes (Signed)
Patient arrives to the ED in transfer from [facility].  Patient was to be admitted under the hospitalist service at this hospital.  Report from EMS is that she was taken directly to the Cath Lab and then it was found that she had taken Xarelto last night and she was sent down to the ED.  She currently is pain-free.  Continues on heparin drip.  Will wait in the ED for a bed.   Deno Etienne, DO 04/26/19 [phone]

## 2019-04-26 NOTE — ED Notes (Signed)
Larene Beach (Daughter) [phone]

## 2019-04-26 NOTE — ED Notes (Signed)
Date and time results received: 04/26/19 3:51 AM   Test: troponin Critical Value: 2320  Name of Provider Notified: Dr. Humphrey Rolls via Shea Evans  Orders Received? Or Actions Taken?: Actions Taken: n/a

## 2019-04-26 NOTE — ED Notes (Signed)
828- pt left via EMS to [facility]  839-report called to Mickel Baas

## 2019-04-26 NOTE — Consult Note (Addendum)
Cardiology Consultation:   Patient ID: Amy Tran MRN: [id number]; DOB: 01-23-1956  Admit date: 04/25/2019 Date of Consult: 04/26/2019  Primary Care Provider: Asencion Noble, MD Primary Cardiologist: Amy Dolly, MD  Primary Electrophysiologist:  None     Patient Profile:   Amy Tran is a 64 y.o. female with a hx ofhypertension, hyperlipidemia, left iliac thrombosis on Xarelto, diabetes mellitus and asthma, new afib who is being seen today for the evaluation of chest pain at the request of Dr. Humphrey Tran.  History of Present Illness:   Amy Tran is a 64 y.o. female with history of HTN, DM2, HLD, tobacco abuse, left iliac vein thrombosis 2019 on Xarelto by Amy Tran, chronic lower extremity edema,seen by Amy Tran in 2018 for chest pain and SOB and lexiscan low risk and echo normal LVEF.  Patient comes in with 6 day history of chest pressure off/on relieved with belching. She worked all day without symptoms but worse at night. It became unbearable last night so she came in. No exertional symptoms. Took Xarelto last night. EKG-Afib with controlled rate, poor R wave progression. She is unaware of Afib.Says she had pain all night long but pain free now. Troponins 820, 2320, 6761. Never wears a mask, hasn't had covid19.   Past Medical History:  Diagnosis Date  . Arthritis   . Asthma   . Cancer [facility])    multiple skin cancers  . Diabetes mellitus without complication ([place])   . Diverticulosis   . Fluid retention   . GERD (gastroesophageal reflux disease)   . Heart murmur   . High cholesterol   . Hypertension   . Incomplete rotator cuff tear   . OSA (obstructive sleep apnea)   . PONV (postoperative nausea and vomiting)   . Wears glasses   . Wears partial dentures    bottom    Past Surgical History:  Procedure Laterality Date  . APPENDECTOMY    . CARPAL TUNNEL RELEASE Bilateral 2001   bilateral  . CERVICAL POLYPECTOMY  04/08/2017   Procedure: POLYPECTOMY;  Surgeon:  Amy Kind, MD;  Location: [facility] ORS;  Service: Gynecology;;  Endometrial  . CHOLECYSTECTOMY    . COLONOSCOPY  2007   [id number] internal hemorrhoids. Diminutive rectal polyp at 10 cm, cold biopsied/removed. The remainder of the rectal mucosa appeared normal Swallow left-sided diverticula. Diminutive polyp at the splenic flexure cold biopsied/removed (adenomatous)  . COLONOSCOPY N/A 12/28/2013   Procedure: COLONOSCOPY;  Surgeon: Amy Dolin, MD;  Location: [facility] ENDO SUITE;  Service: Endoscopy;  Laterality: N/A;  730 - moved to 8:30 - Ginger notified pt  . DILATION AND CURETTAGE, DIAGNOSTIC / THERAPEUTIC  03/07/2017  . ERCP  2002  . GASTRIC BYPASS  2004  . HYSTEROSCOPY WITH D & C N/A 04/08/2017   Procedure: DILATATION AND CURETTAGE /HYSTEROSCOPY;  Surgeon: Amy Kind, MD;  Location: [facility];  Service: Gynecology;  Laterality: N/A;  . INTRAVASCULAR ULTRASOUND/IVUS N/A 09/12/2017   Procedure: INTRAVASCULAR ULTRASOUND/IVUS;  Surgeon: Amy Dutch, MD;  Location: [place] CV LAB;  Service: Cardiovascular;  Laterality: N/A;  . NECK SURGERY  1998   fusion  . PERCUTANEOUS VENOUS THROMBECTOMY,LYSIS WITH INTRAVASCULAR ULTRASOUND (IVUS) Left 09/13/2017   Procedure: REMOVAL OF LYSIS CATHETER AND INTRAVASCULAR ULTRASOUND (IVUS) LEFT ILIAC VEIN;  Surgeon: Amy Dutch, MD;  Location: [place];  Service: Vascular;  Laterality: Left;  . PERIPHERAL VASCULAR INTERVENTION  09/15/2017   Procedure: PERIPHERAL VASCULAR INTERVENTION;  Surgeon: Amy Sandy, MD;  Location: [facility]  INVASIVE CV LAB;  Service: Cardiovascular;;  VEINOUS/STENT  . PERIPHERAL VASCULAR THROMBECTOMY Left 09/12/2017   Procedure: PERIPHERAL VASCULAR THROMBECTOMY;  Surgeon: Amy Dutch, MD;  Location: [place] CV LAB;  Service: Cardiovascular;  Laterality: Left;  . PERIPHERAL VASCULAR THROMBECTOMY Left 09/15/2017   Procedure: PERIPHERAL VASCULAR THROMBECTOMY;  Surgeon: Amy Sandy, MD;  Location: [place] CV LAB;  Service: Cardiovascular;  Laterality: Left;  IVC TO LT FEM/POP VEIN  . SHOULDER ARTHROSCOPY WITH SUBACROMIAL DECOMPRESSION Left 06/23/2013   Procedure: LEFT SHOULDER ARTHROSCOPY WITH DEBRIDEMENT ROTATOR CUFF AND LABRUM;  Surgeon: Amy Junes, MD;  Location: [place];  Service: Orthopedics;  Laterality: Left;  . SHOULDER SURGERY  1999   left     Home Medications:  Prior to Admission medications   Medication Sig Start Date End Date Taking? Authorizing Provider  ALBUTEROL IN Inhale into the lungs as needed.    [provider]  amLODipine (NORVASC) 5 MG tablet Take 5 mg by mouth daily.    [provider]  aspirin EC 81 MG tablet Take 81 mg by mouth.     [provider]  BD PEN NEEDLE NANO U/F 32G X 4 MM MISC USE FOR [place] INJECTIONS 08/03/18   [provider]  benzonatate (TESSALON) 100 MG capsule Take 1 capsule (100 mg total) by mouth every 8 (eight) hours. 01/04/19   Amy Herter, PA-C  canagliflozin (INVOKANA) 300 MG TABS tablet Take 300 mg by mouth daily before breakfast.    [provider]  Cyanocobalamin (VITAMIN B-12 PO) Take by mouth daily.    [provider]  doxycycline (VIBRAMYCIN) 100 MG capsule Take 1 capsule (100 mg total) by mouth 2 (two) times daily. 12/03/18   Nickel, Sharmon Leyden, NP  fluticasone (FLONASE) 50 MCG/ACT nasal spray Place 2 sprays into both nostrils as needed.  06/29/18   [provider]  glipiZIDE (GLUCOTROL) 5 MG tablet Take 5 mg by mouth daily before breakfast.     [provider]  levocetirizine (XYZAL) 5 MG tablet Take 5 mg by mouth daily. 06/29/18   [provider]  losartan (COZAAR) 100 MG tablet Take 100 mg by mouth daily.    [provider]  Magnesium Oxide 500 MG TABS Take by mouth. 05/26/14   [provider]  metFORMIN (GLUCOPHAGE) 1000 MG tablet Take 1,000 mg by mouth daily with breakfast.     [provider]    pantoprazole (PROTONIX) 40 MG tablet Take 40 mg by mouth daily.    [provider]  potassium chloride SA (K-DUR,KLOR-CON) 20 MEQ tablet Take 1 tablet (20 mEq total) by mouth daily. Patient taking differently: Take 20 mEq by mouth every other day.  09/16/17   Rhyne, Hulen Shouts, PA-C  rivaroxaban (XARELTO) 20 MG TABS tablet Take 1 tablet (20 mg total) by mouth daily with supper. 09/17/18   Amy Dutch, MD  simvastatin (ZOCOR) 20 MG tablet Take 20 mg by mouth at bedtime.  07/29/14   [provider]  spironolactone (ALDACTONE) 25 MG tablet Take 25 mg by mouth daily.    [provider]  torsemide (DEMADEX) 20 MG tablet TAKE 2 TABS IN AM AND 1 TAB IN PM Patient taking differently: Take 60 mg by mouth daily.  02/02/16   Arnoldo Lenis, MD  VICTOZA 18 MG/3ML SOPN Taking 1.2mg  daily 08/04/18   [provider]    Inpatient Medications: Scheduled Meds:  Continuous Infusions: . heparin 1,200 Units/hr (  04/26/19 0826)   PRN Meds: acetaminophen, nitroGLYCERIN, ondansetron (ZOFRAN) IV  Allergies:    Allergies  Allergen Reactions  . Penicillins Hives and Rash    Has patient had a PCN reaction causing immediate rash, facial/tongue/throat swelling, SOB or lightheadedness with hypotension: Yes Has patient had a PCN reaction causing severe rash involving mucus membranes or skin necrosis: No Has patient had a PCN reaction that required hospitalization No Has patient had a PCN reaction occurring within the last 10 years: No If all of the above answers are "NO", then may proceed with Cephalosporin use.     Social History:   Social History   Socioeconomic History  . Marital status: Widowed    Spouse name: Not on file  . Number of children: 2  . Years of education: Not on file  . Highest education level: Not on file  Occupational History  . Occupation: works at [employer]: [employer]   Tobacco Use  . Smoking status: Current Every Day Smoker     Packs/day: 0.50    Years: 29.00    Pack years: 14.50    Types: Cigarettes    Start date: 04/09/1970  . Smokeless tobacco: Never Used  Substance and Sexual Activity  . Alcohol use: Not Currently    Comment: occ beer  . Drug use: No  . Sexual activity: Not Currently    Birth control/protection: Post-menopausal  Other Topics Concern  . Not on file  Social History Narrative  . Not on file   Social Determinants of Health   Financial Resource Strain:   . Difficulty of Paying Living Expenses:   Food Insecurity:   . Worried About Charity fundraiser in the Last Year:   . Arboriculturist in the Last Year:   Transportation Needs:   . Film/video editor (Medical):   Marland Kitchen Lack of Transportation (Non-Medical):   Physical Activity:   . Days of Exercise per Week:   . Minutes of Exercise per Session:   Stress:   . Feeling of Stress :   Social Connections:   . Frequency of Communication with Friends and Family:   . Frequency of Social Gatherings with Friends and Family:   . Attends Religious Services:   . Active Member of Clubs or Organizations:   . Attends Archivist Meetings:   Marland Kitchen Marital Status:   Intimate Partner Violence:   . Fear of Current or Ex-Partner:   . Emotionally Abused:   Marland Kitchen Physically Abused:   . Sexually Abused:     Family History:    Family History  Problem Relation Age of Onset  . Diabetes Father   . Hypertension Father   . Parkinson's disease Father   . Cancer Father        not sure what Tran  . Hypertension Mother   . Heart disease Mother   . Drug abuse Sister   . Mesothelioma Brother   . Cancer Daughter        non-hodgkins lmphoma  . Hypertension Brother   . Diabetes Brother   . Colon cancer Neg Hx   . Gastric cancer Neg Hx   . Esophageal cancer Neg Hx      ROS:  Please see the history of present illness.  Review of Systems  Constitution: Negative.  HENT: Negative.   Eyes: Negative.   Cardiovascular: Positive for chest pain,  dyspnea on exertion and leg swelling.  Respiratory: Positive for snoring.   Hematologic/Lymphatic:  Negative.   Musculoskeletal: Negative.  Negative for joint pain.  Gastrointestinal: Negative.   Genitourinary: Negative.   Neurological: Negative.    All other ROS reviewed and negative.     Physical Exam/Data:   Vitals:   04/26/19 0700 04/26/19 0730 04/26/19 0814 04/26/19 0819  BP: 130/83 124/85 115/81   Pulse: 60 (!) 47 67   Resp: 18 (!) 22 20   Temp:    97.9 F (36.6 C)  TempSrc:    Oral  SpO2: (!) 89% 97% 98%   Weight:      Height:       No intake or output data in the 24 hours ending 04/26/19 0841 Last 3 Weights 04/25/2019 02/26/2019 01/03/2019  Weight (lbs) 276 lb 277 lb 3.2 oz 276 lb  Weight (kg) 125.193 kg 125.737 kg 125.193 kg     Body mass index is 47.38 kg/m.  General:  Obese in no acute distress  HEENT: normal Lymph: no adenopathy Neck: no JVD Endocrine:  No thryomegaly Vascular: No carotid bruits; FA pulses 2+ bilaterally without bruits  Cardiac:  normal S1, S2; irreg irreg 2/6 systolic murmur LSB Lungs:  clear to auscultation bilaterally, no wheezing, rhonchi or rales  Abd: soft, nontender, no hepatomegaly  Ext: plus 3 edema bilaterally Musculoskeletal:  No deformities, BUE and BLE strength normal and equal Skin: warm and dry  Neuro:  CNs 2-12 intact, no focal abnormalities noted Psych:  Normal affect   EKG:  The EKG was personally reviewed and demonstrates:  Afib with controlled VR and poor R wave progression anteriorly with is unchanged but Afib new Telemetry:  Telemetry was personally reviewed and demonstrates:  Afib with controlled rate  Relevant CV Studies:  01/2016 echo Study Conclusions   - Left ventricle: The cavity size was mildly dilated. Wall   thickness was normal. Systolic function was normal. The estimated   ejection fraction was in the range of 60% to 65%. The study is   not technically sufficient to allow evaluation of LV diastolic    function. - Pulmonary arteries: PA peak pressure: 32 mm Hg (S). - Pericardium, extracardiac: A trivial pericardial effusion was   identified.   04/2016 Lexiscan  There was no ST segment deviation noted during stress.  The study is normal. There are no perfusion defects consistent with prior infarct or current ischemia. Anterior fixed defect likely causes by breast attenuation.  This is a low risk study.  The left ventricular ejection fraction is normal (55-65%).   Preoperative diagnosis: Left iliac vein thrombosis   Postoperative diagnosis: Same   Anesthesia local   Operative findings: Acute versus chronic thrombus with left common and external iliac vein   #2 8 French sheath left common femoral vein   3.  Placement of UniFuse short TPA catheter left iliac vein    Laboratory Data:  High Sensitivity Troponin:   Recent Labs  Lab 04/25/19 2155 04/26/19 0010 04/26/19 0242 04/26/19 0538  TROPONINIHS 890* 820* 2,320* 6,761*     Chemistry Recent Labs  Lab 04/25/19 2155  NA 135  K 2.9*  CL 97*  CO2 27  GLUCOSE 268*  BUN 15  CREATININE 0.92  CALCIUM 9.3  GFRNONAA >60  GFRAA >60  ANIONGAP 11    No results for input(s): PROT, ALBUMIN, AST, ALT, ALKPHOS, BILITOT in the last 168 hours. Hematology Recent Labs  Lab 04/25/19 2155  WBC 10.9*  RBC 4.77  HGB 12.8  HCT 40.7  MCV 85.3  MCH 26.8  MCHC  31.4  RDW 14.3  PLT 302   BNPNo results for input(s): BNP, PROBNP in the last 168 hours.  DDimer No results for input(s): DDIMER in the last 168 hours.   Radiology/Studies:  DG Chest 2 View  Result Date: 04/25/2019 CLINICAL DATA:  Chest pain for 3 days. EXAM: CHEST - 2 VIEW COMPARISON:  01/03/2019 FINDINGS: The cardiomediastinal contours are normal. Mild bronchial thickening. Pulmonary vasculature is normal. No consolidation, pleural effusion, or pneumothorax. No acute osseous abnormalities are seen. Surgical hardware in the lower cervical spine is partially  included. IMPRESSION: Mild bronchial thickening. Electronically Signed   By: Keith Rake M.D.   On: 04/25/2019 22:33   DG Shoulder Right  Result Date: 04/26/2019 CLINICAL DATA:  Shoulder pain after fall EXAM: RIGHT SHOULDER - 2+ VIEW COMPARISON:  None. FINDINGS: No acute fracture or dislocation of the right shoulder. There is mild right acromioclavicular osteoarthrosis. IMPRESSION: Negative. Electronically Signed   By: Ulyses Jarred M.D.   On: 04/26/2019 00:42       TIMI Risk Score for Unstable Angina or Non-ST Elevation MI:   The patient's TIMI risk score is 4, which indicates a 20% risk of all cause mortality, new or recurrent myocardial infarction or need for urgent revascularization in the next 14 days.   Assessment and Plan:   NSTEMI with Troponins > 6000 chest pain off/on for 6 days. Took Xarelto last night. Transfer to [facility] for cardiac cath, IV heparin. I have reviewed the risks, indications, and alternatives to angioplasty and stenting with the patient. Risks include but are not limited to bleeding, infection, vascular injury, stroke, myocardial infection, arrhythmia, kidney injury, radiation-related injury in the case of prolonged fluoroscopy use, emergency cardiac surgery, and death. The patient understands the risks of serious complication is low (123456) and patient agrees to proceed.   Atrial fib with controlled rate-? Duration-she is unaware she has Afib and on Xarelto for iliac occlusion by Amy Tran.  HTN  HLD LDL 111  DM2-hold metformin  Tobacco abuse-smoking cessation discussed  PVD Left iliac vein thrombosis on Xarelto followed by Amy Tran    Hypokalemia-K 2.9- will give more K and repeat bmet.(had 40 meq last night)    For questions or updates, please contact [place] Please consult [email or web address] for contact info under     Signed, Ermalinda Barrios, PA-C  04/26/2019 8:41 AM    I have examined the patient and reviewed assessment and plan and discussed  with patient.  Agree with above as stated.    Patient had symptoms consistent with coronary ischemia.  These have resolved.  She last took her Xarelto last night.  Therefore, would like to hold off on cardiac catheterization until tomorrow.  IV heparin has been started.  I again went over the risks and benefits of cardiac catheterization.  She is willing to proceed.  Troponins increasing.  I personally reviewed the ECG which shows atrial fibrillation, rate controlled, no significant ST segment changes.  Cardiac catheterization was discussed with the patient fully. The patient understands that risks include but are not limited to stroke (1 in 1000), death (1 in 22), kidney failure [usually temporary] (1 in 500), bleeding (1 in 200), allergic reaction [possibly serious] (1 in 200).  The patient understands and is willing to proceed.    Atrial fibrillation is somewhat new.  Unclear onset.  She will likely require long-term anticoagulation regardless given her iliac vein issues, followed by Amy Tran.  From my review of the chart, it appears  that her iliac occlusion was venous rather than arterial.  She does not know of any peripheral arterial disease.  Long-term, she will benefit from smoking cessation and diabetes control along with weight loss.  Larae Grooms

## 2019-04-26 NOTE — ED Notes (Signed)
Date and time results received: 04/26/19 6:48 AM  Test: troponin Critical Value: 6761  Name of Provider Notified: Dr. Humphrey Rolls via Shea Evans  Orders Received? Or Actions Taken?: Actions Taken: n/a

## 2019-04-27 ENCOUNTER — Inpatient Hospital Stay (HOSPITAL_COMMUNITY): Payer: Medicaid Other

## 2019-04-27 ENCOUNTER — Encounter (HOSPITAL_COMMUNITY)
Admission: EM | Disposition: A | Discharge status: Home / Self Care | Payer: Self-pay | Source: Home / Self Care | Attending: Family Medicine

## 2019-04-27 DIAGNOSIS — I10 Essential (primary) hypertension: Secondary | ICD-10-CM

## 2019-04-27 DIAGNOSIS — I2510 Atherosclerotic heart disease of native coronary artery without angina pectoris: Secondary | ICD-10-CM

## 2019-04-27 DIAGNOSIS — I739 Peripheral vascular disease, unspecified: Secondary | ICD-10-CM

## 2019-04-27 DIAGNOSIS — I249 Acute ischemic heart disease, unspecified: Secondary | ICD-10-CM

## 2019-04-27 DIAGNOSIS — I4819 Other persistent atrial fibrillation: Secondary | ICD-10-CM

## 2019-04-27 HISTORY — PX: LEFT HEART CATH AND CORONARY ANGIOGRAPHY: CATH118249

## 2019-04-27 HISTORY — PX: CORONARY STENT INTERVENTION: CATH118234

## 2019-04-27 LAB — GLUCOSE, CAPILLARY
Glucose-Capillary: 158 mg/dL — ABNORMAL HIGH (ref 70–99)
Glucose-Capillary: 138 mg/dL — ABNORMAL HIGH (ref 70–99)
Glucose-Capillary: 203 mg/dL — ABNORMAL HIGH (ref 70–99)
Glucose-Capillary: 117 mg/dL — ABNORMAL HIGH (ref 70–99)

## 2019-04-27 LAB — CBC
HCT: 36.5 % (ref 36.0–46.0)
Hemoglobin: 11.8 g/dL — ABNORMAL LOW (ref 12.0–15.0)
MCH: 27.4 pg (ref 26.0–34.0)
MCHC: 32.3 g/dL (ref 30.0–36.0)
MCV: 84.7 fL (ref 80.0–100.0)
Platelets: 268 10*3/uL (ref 150–400)
RBC: 4.31 MIL/uL (ref 3.87–5.11)
RDW: 14.6 % (ref 11.5–15.5)
WBC: 8.8 10*3/uL (ref 4.0–10.5)
nRBC: 0 % (ref 0.0–0.2)

## 2019-04-27 LAB — BASIC METABOLIC PANEL
Anion gap: 10 (ref 5–15)
BUN: 16 mg/dL (ref 8–23)
CO2: 28 mmol/L (ref 22–32)
Calcium: 9.2 mg/dL (ref 8.9–10.3)
Chloride: 99 mmol/L (ref 98–111)
Creatinine, Ser: 0.81 mg/dL (ref 0.44–1.00)
GFR calc Af Amer: >60 mL/min (ref >60)
GFR calc non Af Amer: >60 mL/min (ref >60)
Glucose, Bld: 190 mg/dL — ABNORMAL HIGH (ref 70–99)
Potassium: 3.6 mmol/L (ref 3.5–5.1)
Sodium: 137 mmol/L (ref 135–145)

## 2019-04-27 LAB — HEMOGLOBIN A1C
Hgb A1c MFr Bld: 8.5 % — ABNORMAL HIGH (ref 4.8–5.6)
Mean Plasma Glucose: 197.25 mg/dL

## 2019-04-27 LAB — APTT: aPTT: 35 seconds (ref 24–36)

## 2019-04-27 LAB — TROPONIN I (HIGH SENSITIVITY): Troponin I (High Sensitivity): 3815 ng/L (ref <18)

## 2019-04-27 LAB — HEPARIN LEVEL (UNFRACTIONATED): Heparin Unfractionated: 0.34 IU/mL (ref 0.30–0.70)

## 2019-04-27 SURGERY — LEFT HEART CATH AND CORONARY ANGIOGRAPHY
Anesthesia: LOCAL

## 2019-04-27 MED ORDER — SODIUM CHLORIDE 0.9 % IV SOLN: INTRAVENOUS | Status: AC

## 2019-04-27 MED ORDER — HEPARIN (PORCINE) IN NACL 1000-0.9 UT/500ML-% IV SOLN
INTRAVENOUS | Status: DC | PRN
  Administered 2019-04-27: 500 mL

## 2019-04-27 MED ORDER — NITROGLYCERIN 1 MG/10 ML FOR IR/CATH LAB
INTRA_ARTERIAL | Status: DC | PRN
  Administered 2019-04-27 (×3): 200 ug via INTRACORONARY

## 2019-04-27 MED ORDER — HYDRALAZINE HCL 20 MG/ML IJ SOLN: 10.0000 mg | INTRAMUSCULAR | Status: DC | PRN

## 2019-04-27 MED ORDER — SODIUM CHLORIDE 0.9 % IV SOLN: 250.0000 mL | INTRAVENOUS | Status: DC | PRN

## 2019-04-27 MED ORDER — ATORVASTATIN CALCIUM 80 MG PO TABS: 80.0000 mg | ORAL_TABLET | Freq: Every day | ORAL | Status: DC

## 2019-04-27 MED ORDER — HEPARIN SODIUM (PORCINE) 1000 UNIT/ML IJ SOLN
INTRAMUSCULAR | Status: AC
  Filled 2019-04-27: qty 2

## 2019-04-27 MED ORDER — HEPARIN (PORCINE) IN NACL 1000-0.9 UT/500ML-% IV SOLN
INTRAVENOUS | Status: AC
  Filled 2019-04-27: qty 500

## 2019-04-27 MED ORDER — MIDAZOLAM HCL 2 MG/2ML IJ SOLN
INTRAMUSCULAR | Status: AC
  Filled 2019-04-27: qty 2

## 2019-04-27 MED ORDER — VERAPAMIL HCL 2.5 MG/ML IV SOLN
INTRAVENOUS | Status: DC | PRN
  Administered 2019-04-27: 11:00:00 10 mL via INTRA_ARTERIAL

## 2019-04-27 MED ORDER — SODIUM CHLORIDE 0.9% FLUSH
3.0000 mL | Freq: Two times a day (BID) | INTRAVENOUS | Status: DC
  Administered 2019-04-27: 3 mL via INTRAVENOUS

## 2019-04-27 MED ORDER — HEPARIN SODIUM (PORCINE) 1000 UNIT/ML IJ SOLN
INTRAMUSCULAR | Status: AC
  Filled 2019-04-27: qty 1

## 2019-04-27 MED ORDER — NITROGLYCERIN 1 MG/10 ML FOR IR/CATH LAB
INTRA_ARTERIAL | Status: AC
  Filled 2019-04-27: qty 10

## 2019-04-27 MED ORDER — RIVAROXABAN 20 MG PO TABS: 20.0000 mg | ORAL_TABLET | Freq: Every day | ORAL | Status: DC

## 2019-04-27 MED ORDER — SODIUM CHLORIDE 0.9% FLUSH
3.0000 mL | Freq: Two times a day (BID) | INTRAVENOUS | Status: DC
  Administered 2019-04-28: 3 mL via INTRAVENOUS

## 2019-04-27 MED ORDER — SODIUM CHLORIDE 0.9% FLUSH: 3.0000 mL | INTRAVENOUS | Status: DC | PRN

## 2019-04-27 MED ORDER — CLOPIDOGREL BISULFATE 75 MG PO TABS
75.0000 mg | ORAL_TABLET | Freq: Every day | ORAL | Status: DC
  Administered 2019-04-28: 75 mg via ORAL
  Filled 2019-04-27: qty 1

## 2019-04-27 MED ORDER — ASPIRIN 81 MG PO CHEW
81.0000 mg | CHEWABLE_TABLET | ORAL | Status: AC
  Administered 2019-04-27: 81 mg via ORAL
  Filled 2019-04-27: qty 1

## 2019-04-27 MED ORDER — VERAPAMIL HCL 2.5 MG/ML IV SOLN
INTRAVENOUS | Status: AC
  Filled 2019-04-27: qty 2

## 2019-04-27 MED ORDER — ACETAMINOPHEN 325 MG PO TABS: 650.0000 mg | ORAL_TABLET | ORAL | Status: DC | PRN

## 2019-04-27 MED ORDER — LABETALOL HCL 5 MG/ML IV SOLN: 10.0000 mg | INTRAVENOUS | Status: DC | PRN

## 2019-04-27 MED ORDER — MIDAZOLAM HCL 2 MG/2ML IJ SOLN
INTRAMUSCULAR | Status: DC | PRN
  Administered 2019-04-27: 2 mg via INTRAVENOUS

## 2019-04-27 MED ORDER — SODIUM CHLORIDE 0.9 % WEIGHT BASED INFUSION
3.0000 mL/kg/h | INTRAVENOUS | Status: DC
  Administered 2019-04-27: 3 mL/kg/h via INTRAVENOUS

## 2019-04-27 MED ORDER — FENTANYL CITRATE (PF) 100 MCG/2ML IJ SOLN
INTRAMUSCULAR | Status: DC | PRN
  Administered 2019-04-27: 25 ug via INTRAVENOUS

## 2019-04-27 MED ORDER — CLOPIDOGREL BISULFATE 300 MG PO TABS
ORAL_TABLET | ORAL | Status: DC | PRN
  Administered 2019-04-27: 600 mg via ORAL

## 2019-04-27 MED ORDER — DIAZEPAM 5 MG PO TABS: 5.0000 mg | ORAL_TABLET | Freq: Four times a day (QID) | ORAL | Status: DC | PRN

## 2019-04-27 MED ORDER — IOHEXOL 350 MG/ML SOLN
INTRAVENOUS | Status: DC | PRN
  Administered 2019-04-27: 110 mL

## 2019-04-27 MED ORDER — ATORVASTATIN CALCIUM 80 MG PO TABS
80.0000 mg | ORAL_TABLET | Freq: Every day | ORAL | Status: DC
  Administered 2019-04-27: 80 mg via ORAL
  Filled 2019-04-27: qty 1

## 2019-04-27 MED ORDER — LIDOCAINE HCL (PF) 1 % IJ SOLN
INTRAMUSCULAR | Status: AC
  Filled 2019-04-27: qty 30

## 2019-04-27 MED ORDER — ONDANSETRON HCL 4 MG/2ML IJ SOLN: 4.0000 mg | Freq: Four times a day (QID) | INTRAMUSCULAR | Status: DC | PRN

## 2019-04-27 MED ORDER — SODIUM CHLORIDE 0.9 % IV SOLN: INTRAVENOUS | Status: DC

## 2019-04-27 MED ORDER — ASPIRIN 81 MG PO CHEW: 81.0000 mg | CHEWABLE_TABLET | Freq: Every day | ORAL | Status: DC

## 2019-04-27 MED ORDER — SODIUM CHLORIDE 0.9 % WEIGHT BASED INFUSION
1.0000 mL/kg/h | INTRAVENOUS | Status: DC
  Administered 2019-04-27: 1 mL/kg/h via INTRAVENOUS

## 2019-04-27 MED ORDER — FENTANYL CITRATE (PF) 100 MCG/2ML IJ SOLN
INTRAMUSCULAR | Status: AC
  Filled 2019-04-27: qty 2

## 2019-04-27 MED ORDER — CLOPIDOGREL BISULFATE 300 MG PO TABS
ORAL_TABLET | ORAL | Status: AC
  Filled 2019-04-27: qty 2

## 2019-04-27 MED ORDER — HEPARIN SODIUM (PORCINE) 1000 UNIT/ML IJ SOLN
INTRAMUSCULAR | Status: DC | PRN
  Administered 2019-04-27: 6000 [IU] via INTRAVENOUS
  Administered 2019-04-27 (×2): 2000 [IU] via INTRAVENOUS
  Administered 2019-04-27: 6000 [IU] via INTRAVENOUS

## 2019-04-27 MED ORDER — LIDOCAINE HCL (PF) 1 % IJ SOLN
INTRAMUSCULAR | Status: DC | PRN
  Administered 2019-04-27: 2 mL

## 2019-04-27 SURGICAL SUPPLY — 21 items
BALLN SAPPHIRE 2.0X12 (BALLOONS) ×2
BALLN SAPPHIRE ~~LOC~~ 3.5X15 (BALLOONS) ×1 IMPLANT
BALLN SAPPHIRE ~~LOC~~ 3.75X12 (BALLOONS) ×1 IMPLANT
BALLOON SAPPHIRE 2.0X12 (BALLOONS) IMPLANT
CATH LAUNCHER 6FR JR4 (CATHETERS) ×1 IMPLANT
CATH OPTITORQUE TIG 4.0 5F (CATHETERS) ×1 IMPLANT
DEVICE RAD COMP TR BAND LRG (VASCULAR PRODUCTS) ×1 IMPLANT
GLIDESHEATH SLEND SS 6F .021 (SHEATH) ×1 IMPLANT
GUIDEWIRE INQWIRE 1.5J.035X260 (WIRE) IMPLANT
HOVERMATT SINGLE USE (MISCELLANEOUS) ×1 IMPLANT
INQWIRE 1.5J .035X260CM (WIRE) ×2
KIT ENCORE 26 ADVANTAGE (KITS) ×1 IMPLANT
KIT HEART LEFT (KITS) ×2 IMPLANT
PACK CARDIAC CATHETERIZATION (CUSTOM PROCEDURE TRAY) ×2 IMPLANT
SHEATH PROBE COVER 6X72 (BAG) ×1 IMPLANT
STENT RESOLUTE ONYX 3.0X18 (Permanent Stent) ×1 IMPLANT
TRANSDUCER W/STOPCOCK (MISCELLANEOUS) ×2 IMPLANT
TUBING ART PRESS 72  MALE/FEM (TUBING) ×2
TUBING ART PRESS 72 MALE/FEM (TUBING) IMPLANT
TUBING CIL FLEX 10 FLL-RA (TUBING) ×2 IMPLANT
WIRE COUGAR XT STRL 190CM (WIRE) ×1 IMPLANT

## 2019-04-27 NOTE — Progress Notes (Signed)
[place] for Heparin Indication: chest pain/ACS  Allergies  Allergen Reactions  . Penicillins Hives and Rash    Has patient had a PCN reaction causing immediate rash, facial/tongue/throat swelling, SOB or lightheadedness with hypotension: Yes Has patient had a PCN reaction causing severe rash involving mucus membranes or skin necrosis: No Has patient had a PCN reaction that required hospitalization No Has patient had a PCN reaction occurring within the last 10 years: No If all of the above answers are "NO", then may proceed with Cephalosporin use.     Patient Measurements: Height: 5\' 4"  (162.6 cm) Weight: 273 lb 12.8 oz (124.2 kg) IBW/kg (Calculated) : 54.7 Heparin Dosing Weight: 85 kg  Vital Signs: Temp: 98.3 F (36.8 C) (03/15 2352) Temp Source: Oral (03/15 2352) BP: 104/80 (03/15 2352) Pulse Rate: 72 (03/15 2352)  Labs: Recent Labs    04/25/19 2155 04/25/19 2155 04/26/19 0010 04/26/19 0242 04/26/19 0538 04/26/19 0801 04/26/19 1008 04/27/19 0242  HGB 12.8  --   --   --   --   --   --  11.8*  HCT 40.7  --   --   --   --   --   --  36.5  PLT 302  --   --   --   --   --   --  268  APTT  --   --   --   --  30  --   --  35  LABPROT  --   --   --   --  18.9*  --   --   --   INR  --   --   --   --  1.6*  --   --   --   HEPARINUNFRC  --   --   --   --  >2.20* >2.20*  --  0.34  CREATININE 0.92  --   --   --   --   --  0.68  --   TROPONINIHS 890*   < > 820* 2,320* 6,761*  --   --   --    < > = values in this interval not displayed.    Estimated Creatinine Clearance: 92.5 mL/min (by C-G formula based on SCr of 0.68 mg/dL).  Assessment: 64 y.o. female with chest pain, h/o Afib and Xarelto on hold, for heparin.   Goal of Therapy:  Heparin level 0.3-0.7 units/ml Monitor platelets by anticoagulation protocol: Yes   Plan:  Increase Heparin 1500 units/hr Check heparin level in 8 hours.   Phillis Knack, PharmD, BCPS

## 2019-04-27 NOTE — Progress Notes (Signed)
PROGRESS NOTE    Amy Tran  [id number] DOB: 1955-02-23 DOA: 04/25/2019 PCP: Asencion Noble, MD      Brief Narrative:  Amy Tran is a 64 y.o. F with DM, HTN, DVT on Xarelto, and obesity who presented with chest pain.  Found to have NSTEMI.      Assessment & Plan:  NSTEMI -Continue aspirin, simvastatin -Continue losartan -Bradycardia precludes BB -Continue heparin gtt -Consult Cardiology, appreciate expert work -Echo ordered      Hypertension BP controlled -Continue amlodipine, spironolactone, torsemide, losartan, K  Diabetes A1c 8.5% not ideal control Glucoses good this morning -Continue SS correction insulin -Hold Victoza  History DVT -Hold Xarelto until post-cath  New onset atrial fibrillation, paroxysmal CHA2DS2-Vasc at least 4.  Rate very slow. -Resume Xarelto after surgery, recommend to continue lifelong  Hypokalemia Resolved       Disposition: The patient was evaluated for an aspirin, found to have NSTEMI.  She was started on heparin, and cardiology recommended LHC today.  If medical management or single stent, likely home tomorrow.  Otherwise there is no prior findings on LHC.       MDM: The below labs and imaging reports reviewed and summarized above.  Medication management as above.    DVT prophylaxis: N/A on heparin Code Status: FULL Family Communication:     Consultants:   Cardiology  Procedures:   LHC today  Echo pending  Antimicrobials:      Culture data:              Subjective: No chest pain.  She is not swelling the legs which is chronic and unchanged.  No dyspnea, no orthopnea, no confusion, no loss of consciousness, no palpitation.       Objective: Vitals:   04/26/19 2145 04/26/19 2352 04/27/19 0451 04/27/19 0822  BP: 106/80 104/80 104/73 98/66  Pulse: (!) 51 72 (!) 47 64  Resp: 15     Temp:  98.3 F (36.8 C) 98.4 F (36.9 C)   TempSrc:  Oral Oral   SpO2: 100% 96% 94% 95%   Weight:  124.2 kg    Height:  5\' 4"  (1.626 m)      Intake/Output Summary (Last 24 hours) at 04/27/2019 [id number] Last data filed at 04/26/2019 2350 Gross per 24 hour  Intake 490 ml  Output 1200 ml  Net -710 ml   Filed Weights   04/25/19 2132 04/26/19 2352  Weight: 125.2 kg 124.2 kg    Examination: General appearance: Obese elderly adult female, alert and in no acute distress.  Sleeping, easily aroused, lying in bed. HEENT: Anicteric, conjunctiva pink, lids and lashes normal. No nasal deformity, discharge, epistaxis.  Lips moist, mostly edentulous. OP moist, no oral lesions.   Skin: Warm and dry.  No suspicious rashes or lesions.  Chronic venous stasis changes of both legs. Cardiac: RRR, no murmurs appreciated.  Bilateral 1+ LE edema.   JVP not visible due to body habitus. Respiratory: Normal respiratory rate and rhythm.  CTAB without rales or wheezes. Abdomen: Abdomen soft.  No tenderness palpation or guarding. No ascites, distension, hepatosplenomegaly.   MSK: No deformities or effusions of the large joints of the upper or lower extremities bilaterally. Neuro: Awake and alert, sleeping but easily aroused. Naming is grossly intact, and the patient's recall, recent and remote, as well as general fund of knowledge seem within normal limits.  Muscle tone normal, without fasciculations.  Moves all extremities equally and with normal coordination.  Speech fluent.  Psych: Sensorium intact and responding to questions, attention normal. Affect normal.  Judgment and insight appear normal.      Data Reviewed: I have personally reviewed following labs and imaging studies:  CBC: Recent Labs  Lab 04/25/19 2155 04/27/19 0242  WBC 10.9* 8.8  HGB 12.8 11.8*  HCT 40.7 36.5  MCV 85.3 84.7  PLT 302 [id number]   Basic Metabolic Panel: Recent Labs  Lab 04/25/19 2155 04/26/19 1008 04/27/19 0242  NA 135 136 137  K 2.9* 3.0* 3.6  CL 97* 101 99  CO2 27 28 28   GLUCOSE 268* 166* 190*  BUN 15 12 16     CREATININE 0.92 0.68 0.81  CALCIUM 9.3 9.1 9.2   GFR: Estimated Creatinine Clearance: 91.4 mL/min (by C-G formula based on SCr of 0.81 mg/dL). Liver Function Tests: No results for input(s): AST, ALT, ALKPHOS, BILITOT, PROT, ALBUMIN in the last 168 hours. No results for input(s): LIPASE, AMYLASE in the last 168 hours. No results for input(s): AMMONIA in the last 168 hours. Coagulation Profile: Recent Labs  Lab 04/26/19 0538  INR 1.6*   Cardiac Enzymes: No results for input(s): CKTOTAL, CKMB, CKMBINDEX, TROPONINI in the last 168 hours. BNP (last 3 results) No results for input(s): PROBNP in the last 8760 hours. HbA1C: Recent Labs    04/27/19 0242  HGBA1C 8.5*   CBG: Recent Labs  Lab 04/26/19 2028 04/26/19 2201 04/27/19 0801  GLUCAP 171* 160* 158*   Lipid Profile: Recent Labs    04/26/19 0010  CHOL 174  HDL 41  LDLCALC 111*  TRIG 110  CHOLHDL 4.2   Thyroid Function Tests: No results for input(s): TSH, T4TOTAL, FREET4, T3FREE, THYROIDAB in the last 72 hours. Anemia Panel: No results for input(s): VITAMINB12, FOLATE, FERRITIN, TIBC, IRON, RETICCTPCT in the last 72 hours. Urine analysis:    Component Value Date/Time   COLORURINE YELLOW 04/02/2017 1325   APPEARANCEUR HAZY (A) 04/02/2017 1325   LABSPEC 1.033 (H) 04/02/2017 1325   PHURINE 5.0 04/02/2017 1325   GLUCOSEU >=500 (A) 04/02/2017 1325   HGBUR LARGE (A) 04/02/2017 1325   BILIRUBINUR NEGATIVE 04/02/2017 1325   KETONESUR NEGATIVE 04/02/2017 1325   PROTEINUR 30 (A) 04/02/2017 1325   NITRITE NEGATIVE 04/02/2017 1325   LEUKOCYTESUR NEGATIVE 04/02/2017 1325   Sepsis Labs: @LABRCNTIP (procalcitonin:4,lacticacidven:4)  ) Recent Results (from the past 240 hour(s))  SARS CORONAVIRUS 2 (TAT 6-24 HRS) Nasopharyngeal Nasopharyngeal Swab     Status: None   Collection Time: 04/25/19 11:57 PM   Specimen: Nasopharyngeal Swab  Result Value Ref Range Status   SARS Coronavirus 2 NEGATIVE NEGATIVE Final    Comment:  (NOTE) SARS-CoV-2 target nucleic acids are NOT DETECTED. The SARS-CoV-2 RNA is generally detectable in upper and lower respiratory specimens during the acute phase of infection. Negative results do not preclude SARS-CoV-2 infection, do not rule out co-infections with other pathogens, and should not be used as the sole basis for treatment or other patient management decisions. Negative results must be combined with clinical observations, patient history, and epidemiological information. The expected result is Negative. Fact Sheet for Patients: SugarRoll.be Fact Sheet for Healthcare Providers: https://www.woods-mathews.com/ This test is not yet approved or cleared by the [place] FDA and  has been authorized for detection and/or diagnosis of SARS-CoV-2 by FDA under an Emergency Use Authorization (EUA). This EUA will remain  in effect (meaning this test can be used) for the duration of the COVID-19 declaration under Section 56 4(b)(1) of the Act, 21 U.S.C. section 360bbb-3(b)(1), unless the  authorization is terminated or revoked sooner. Performed at [facility], [place] [address] [place], [place] [address]          Radiology Studies: DG Chest 2 View  Result Date: 04/25/2019 CLINICAL DATA:  Chest pain for 3 days. EXAM: CHEST - 2 VIEW COMPARISON:  01/03/2019 FINDINGS: The cardiomediastinal contours are normal. Mild bronchial thickening. Pulmonary vasculature is normal. No consolidation, pleural effusion, or pneumothorax. No acute osseous abnormalities are seen. Surgical hardware in the lower cervical spine is partially included. IMPRESSION: Mild bronchial thickening. Electronically Signed   By: Keith Rake M.D.   On: 04/25/2019 22:33   DG Shoulder Right  Result Date: 04/26/2019 CLINICAL DATA:  Shoulder pain after fall EXAM: RIGHT SHOULDER - 2+ VIEW COMPARISON:  None. FINDINGS: No acute fracture or dislocation of the right  shoulder. There is mild right acromioclavicular osteoarthrosis. IMPRESSION: Negative. Electronically Signed   By: Ulyses Jarred M.D.   On: 04/26/2019 00:42        Scheduled Meds: . amLODipine  5 mg Oral Daily  . aspirin EC  81 mg Oral Daily  . atorvastatin  80 mg Oral q1800  . insulin aspart  0-15 Units Subcutaneous TID WC  . insulin aspart  0-5 Units Subcutaneous QHS  . losartan  100 mg Oral Daily  . sodium chloride flush  3 mL Intravenous Q12H  . spironolactone  25 mg Oral Daily  . torsemide  60 mg Oral Daily   Continuous Infusions: . sodium chloride    . sodium chloride 1 mL/kg/hr (04/27/19 0511)  . heparin 1,500 Units/hr (04/27/19 0350)     LOS: 1 day    Time spent: 25 minutes    Edwin Dada, MD [facility] 04/27/2019, 9:38 AM     Please page though [place] or Epic secure chat:  For password, contact charge nurse

## 2019-04-27 NOTE — Progress Notes (Addendum)
Progress Note  Patient Name: Amy Tran Date of Encounter: 04/27/2019  Primary Cardiologist: Carlyle Dolly, MD   Subjective   Feeling well this morning. No chest pain.   Inpatient Medications    Scheduled Meds: . amLODipine  5 mg Oral Daily  . aspirin EC  81 mg Oral Daily  . insulin aspart  0-15 Units Subcutaneous TID WC  . insulin aspart  0-5 Units Subcutaneous QHS  . losartan  100 mg Oral Daily  . potassium chloride  40 mEq Oral BID  . simvastatin  20 mg Oral QHS  . sodium chloride flush  3 mL Intravenous Q12H  . spironolactone  25 mg Oral Daily  . torsemide  60 mg Oral Daily   Continuous Infusions: . sodium chloride    . sodium chloride 1 mL/kg/hr (04/27/19 0511)  . heparin 1,500 Units/hr (04/27/19 0350)   PRN Meds: sodium chloride, acetaminophen, nitroGLYCERIN, ondansetron (ZOFRAN) IV, sodium chloride flush   Vital Signs    Vitals:   04/26/19 2130 04/26/19 2145 04/26/19 2352 04/27/19 0451  BP: (!) 106/56 106/80 104/80 104/73  Pulse: 73 (!) 51 72 (!) 47  Resp: 19 15    Temp:   98.3 F (36.8 C) 98.4 F (36.9 C)  TempSrc:   Oral Oral  SpO2: 96% 100% 96% 94%  Weight:   124.2 kg   Height:   5\' 4"  (1.626 m)     Intake/Output Summary (Last 24 hours) at 04/27/2019 0744 Last data filed at 04/26/2019 2350 Gross per 24 hour  Intake 490 ml  Output 1200 ml  Net -710 ml   Last 3 Weights 04/26/2019 04/25/2019 02/26/2019  Weight (lbs) 273 lb 12.8 oz 276 lb 277 lb 3.2 oz  Weight (kg) 124.195 kg 125.193 kg 125.737 kg      Telemetry    Afib rate controlled - Personally Reviewed  ECG    No new tracing this morning.   Physical Exam  Pleasant obese WF,  GEN: No acute distress.   Neck: No JVD Cardiac: Irreg Irreg, no murmurs, rubs, or gallops.  Respiratory: expiratory wheezing GI: Soft, nontender, non-distended  MS: No edema; No deformity. Neuro:  Nonfocal  Psych: Normal affect   Labs    High Sensitivity Troponin:   Recent Labs  Lab  04/25/19 2155 04/26/19 0010 04/26/19 0242 04/26/19 0538  TROPONINIHS 890* 820* 2,320* 6,761*      Chemistry Recent Labs  Lab 04/25/19 2155 04/26/19 1008 04/27/19 0242  NA 135 136 137  K 2.9* 3.0* 3.6  CL 97* 101 99  CO2 27 28 28   GLUCOSE 268* 166* 190*  BUN 15 12 16   CREATININE 0.92 0.68 0.81  CALCIUM 9.3 9.1 9.2  GFRNONAA >60 >60 >60  GFRAA >60 >60 >60  ANIONGAP 11 7 10      Hematology Recent Labs  Lab 04/25/19 2155 04/27/19 0242  WBC 10.9* 8.8  RBC 4.77 4.31  HGB 12.8 11.8*  HCT 40.7 36.5  MCV 85.3 84.7  MCH 26.8 27.4  MCHC 31.4 32.3  RDW 14.3 14.6  PLT 302 268    BNPNo results for input(s): BNP, PROBNP in the last 168 hours.   DDimer No results for input(s): DDIMER in the last 168 hours.   Radiology    DG Chest 2 View  Result Date: 04/25/2019 CLINICAL DATA:  Chest pain for 3 days. EXAM: CHEST - 2 VIEW COMPARISON:  01/03/2019 FINDINGS: The cardiomediastinal contours are normal. Mild bronchial thickening. Pulmonary vasculature is normal. No consolidation,  pleural effusion, or pneumothorax. No acute osseous abnormalities are seen. Surgical hardware in the lower cervical spine is partially included. IMPRESSION: Mild bronchial thickening. Electronically Signed   By: Keith Rake M.D.   On: 04/25/2019 22:33   DG Shoulder Right  Result Date: 04/26/2019 CLINICAL DATA:  Shoulder pain after fall EXAM: RIGHT SHOULDER - 2+ VIEW COMPARISON:  None. FINDINGS: No acute fracture or dislocation of the right shoulder. There is mild right acromioclavicular osteoarthrosis. IMPRESSION: Negative. Electronically Signed   By: Ulyses Jarred M.D.   On: 04/26/2019 00:42    Cardiac Studies   N/a   Patient Profile     64 y.o. female with a hx of hypertension, hyperlipidemia, left iliac thrombosis on Xarelto, diabetes mellitus and asthma, new afib who was seen for the evaluation of chest pain at the request of Dr. Humphrey Rolls. Transferred to [facility] for further management with cardiac  cath.    Assessment & Plan    1. NSTEMI: hsTn peaked at 6761. Xarelto on hold with plans for cardiac cath today, this afternoon. No chest pain overnight.  -- continue IV heparin, ASA, statin  2. New onset Afib: rates are controlled in the 50-60s. Patient was unaware of rhythm on admission. This patients CHA2DS2-VASc Score of at least 3. Was on Xarelto prior to admission for iliac occlusion. Would plan to continue the same post cath.   3. HTN: stable with current therapy  4. HL: on Zocor prior to admission. LDL 111, will switch to atorvastatin 80mg .   5. DM: metformin held for cath. On SSI. Hgb A1c 8.5  6. PVD with left iliac vein thrombosis: followed by Dr. Oneida Alar, as above on Xarelto PTA  7. Hypokalemia: K+ improved to 3.6  For questions or updates, please contact [place] Please consult [email or web address] for contact info under   Signed, Reino Bellis, NP  04/27/2019, 7:44 AM    I have examined the patient and reviewed assessment and plan and discussed with patient.  Agree with above as stated.  Cath today.  She appears comfortable.  She will need to restart Xarelto post cath for venous issues and recent onset AFib.  WOuld use Plavix if PCI was done and avoid aspirin.    Larae Grooms

## 2019-04-28 ENCOUNTER — Inpatient Hospital Stay (HOSPITAL_COMMUNITY): Payer: Medicaid Other

## 2019-04-28 DIAGNOSIS — I340 Nonrheumatic mitral (valve) insufficiency: Secondary | ICD-10-CM

## 2019-04-28 DIAGNOSIS — Z7901 Long term (current) use of anticoagulants: Secondary | ICD-10-CM

## 2019-04-28 DIAGNOSIS — I350 Nonrheumatic aortic (valve) stenosis: Secondary | ICD-10-CM

## 2019-04-28 DIAGNOSIS — Z955 Presence of coronary angioplasty implant and graft: Secondary | ICD-10-CM

## 2019-04-28 DIAGNOSIS — E119 Type 2 diabetes mellitus without complications: Secondary | ICD-10-CM

## 2019-04-28 DIAGNOSIS — I249 Acute ischemic heart disease, unspecified: Secondary | ICD-10-CM

## 2019-04-28 LAB — CBC
HCT: 35.1 % — ABNORMAL LOW (ref 36.0–46.0)
Hemoglobin: 11.1 g/dL — ABNORMAL LOW (ref 12.0–15.0)
MCH: 27.5 pg (ref 26.0–34.0)
MCHC: 31.6 g/dL (ref 30.0–36.0)
MCV: 87.1 fL (ref 80.0–100.0)
Platelets: 259 10*3/uL (ref 150–400)
RBC: 4.03 MIL/uL (ref 3.87–5.11)
RDW: 14.8 % (ref 11.5–15.5)
WBC: 8.1 10*3/uL (ref 4.0–10.5)
nRBC: 0 % (ref 0.0–0.2)

## 2019-04-28 LAB — BASIC METABOLIC PANEL
Anion gap: 11 (ref 5–15)
BUN: 14 mg/dL (ref 8–23)
CO2: 25 mmol/L (ref 22–32)
Calcium: 8.9 mg/dL (ref 8.9–10.3)
Chloride: 103 mmol/L (ref 98–111)
Creatinine, Ser: 0.74 mg/dL (ref 0.44–1.00)
GFR calc Af Amer: >60 mL/min (ref >60)
GFR calc non Af Amer: >60 mL/min (ref >60)
Glucose, Bld: 144 mg/dL — ABNORMAL HIGH (ref 70–99)
Potassium: 3.9 mmol/L (ref 3.5–5.1)
Sodium: 139 mmol/L (ref 135–145)

## 2019-04-28 LAB — GLUCOSE, CAPILLARY
Glucose-Capillary: 153 mg/dL — ABNORMAL HIGH (ref 70–99)
Glucose-Capillary: 179 mg/dL — ABNORMAL HIGH (ref 70–99)

## 2019-04-28 LAB — ECHOCARDIOGRAM COMPLETE
Height: 64 in
Weight: 4496 oz

## 2019-04-28 LAB — POCT ACTIVATED CLOTTING TIME
Activated Clotting Time: 197 seconds
Activated Clotting Time: 230 seconds
Activated Clotting Time: 439 seconds

## 2019-04-28 MED ORDER — ATORVASTATIN CALCIUM 80 MG PO TABS: 80.0000 mg | ORAL_TABLET | Freq: Every day | ORAL | 0 refills | Status: DC

## 2019-04-28 MED ORDER — CLOPIDOGREL BISULFATE 75 MG PO TABS: 75.0000 mg | ORAL_TABLET | Freq: Every day | ORAL | 0 refills | Status: DC

## 2019-04-28 MED ORDER — ASPIRIN EC 81 MG PO TBEC
81.0000 mg | DELAYED_RELEASE_TABLET | Freq: Every day | ORAL | Status: DC
  Administered 2019-04-28: 81 mg via ORAL
  Filled 2019-04-28: qty 1

## 2019-04-28 MED ORDER — NITROGLYCERIN 0.4 MG SL SUBL: 0.4000 mg | SUBLINGUAL_TABLET | SUBLINGUAL | 0 refills | Status: AC | PRN

## 2019-04-28 MED FILL — NITROGLYCERIN 0.4 MG TAB SL: 0.4 | 8 days supply | Qty: 25 | Fill #0

## 2019-04-28 MED FILL — CLOPIDOGREL 75 MG TABLET: 75 | 30 days supply | Qty: 30 | Fill #0

## 2019-04-28 MED FILL — ATORVASTATIN CALCIUM 80 MG: 80 | 30 days supply | Qty: 30 | Fill #0

## 2019-04-28 NOTE — Progress Notes (Signed)
  Echocardiogram 2D Echocardiogram has been performed.  Darlina Sicilian M 04/28/2019, 10:51 AM

## 2019-04-28 NOTE — Progress Notes (Signed)
CARDIAC REHAB PHASE I   PRE:  Rate/Rhythm: 72 afib    BP: sitting 129/58    SaO2:   MODE:  Ambulation: 370 ft   POST:  Rate/Rhythm: 88 afib    BP: sitting 158 /71    SaO2: 98 RA  Tolerated fairly well. Legs slightly weaker per pt. VSS. Discussed MI, stent, restrictions, Plavix, diet, smoking cessation, exercise, NTG, and CRPII. Voiced understanding. She is eager to quit smoking (quit 16 years previously).  She plans to work on her diet and especially soda. Discussed risks. Will refer to [place]. Pt is interested in participating in Virtual Cardiac and Pulmonary Rehab. Pt advised that Virtual Cardiac and Pulmonary Rehab is provided at no cost to the patient.  Checklist:  1. Pt has smart device  ie smartphone and/or ipad for downloading an app  Yes 2. Reliable internet/wifi service    Yes 3. Understands how to use their smartphone and navigate within an app.  Yes Pt verbalized understanding and is in agreement. [place], ACSM 04/28/2019 9:26 AM

## 2019-04-28 NOTE — Discharge Summary (Addendum)
Discharge Summary  Amy Tran [id number] DOB: [birth year]  PCP: Asencion Noble, MD  Admit date: 04/25/2019 Discharge date: 04/28/2019  Time spent: 64mins, more than 50% time spent on coordination of care.  Recommendations for Outpatient Follow-up:  1. F/u with PCP within a week  for hospital discharge follow up, repeat cbc/bmp at follow up 2. Follow-up with cardiology, currently plan on DCCV in the near future 3. Patient is being referred to cardiac rehab  Discharge Diagnoses:  Active Hospital Problems   Diagnosis Date Noted  . Chest pain 04/26/2019  . Acute coronary syndrome ([place])   . Tobacco dependence 04/26/2019  . NSTEMI (non-ST elevated myocardial infarction) ([place]) 04/26/2019  . Hypokalemia 04/02/2017    Resolved Hospital Problems  No resolved problems to display.    Discharge Condition: stable  Diet recommendation: heart healthy/carb modified  Filed Weights   04/25/19 2132 04/26/19 2352 04/28/19 0616  Weight: 125.2 kg 124.2 kg 127.5 kg    History of present illness: (per admitting Md Dr Humphrey Rolls) Chief Complaint: Chest pain  HPI: Amy Tran is a 64 y.o. female with medical history significant of hypertension, hyperlipidemia, atrial fibrillation on Xarelto, diabetes mellitus and asthma presented to ED for evaluation of chest pain.  Patient states that she is started having chest pain for the last 2 to 3 days.  Chest pain is located in the center of chest, severe tightness in nature, 7 out of 10 on pain scale when it is worse, episodic and stays for 20 to 25 minutes, improved with nothing while getting worse with exertion.  Chest pain is associated with mild dizziness but no dyspnea or diaphoresis.  Patient denies fever, chills, sore throat, cough, GERD, nausea, vomiting, abdominal pain, recent injury to the chest and recent travel history.  Patient admits of smoking 1 pack of cigarettes daily.  ED Course: On arrival to the ED and had blood pressure of 169/78,  heart rate 68, respiratory rate 20 and oxygen saturation 95% on room air.  Blood work showed potassium of 2.9, blood glucose 268 and troponin of 890.  Chest x-ray showed mild bronchial thickness but no acute cardiopulmonary pathology.  EKG showed atrial fibrillation with a heart rate of 67, and was negative for acute ischemic changes.  Patient was given aspirin in the ED.  ED physician contacted Dr. 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  Hospital Course:  Principal Problem:   Chest pain Active Problems:   Hypokalemia   Tobacco dependence   NSTEMI (non-ST elevated myocardial infarction) (HCC)   Acute coronary syndrome (HCC)  Non-STEMI High-sensitivity troponin peaked at 6761 Status post cardiac catheterization on March 16 found to have high-grade lesion in posterior RCA status post PCI DES x1 Cardiology recommended triple therapy with aspirin Plavix and Xarelto for a month, then stop aspirin. She is started on Lipitor 80 mg daily, need to follow-up lipid panel and LFT in 8 weeks She is not on beta-blocker due to bradycardia Echocardiogram showed left ventricular EF 55 to 60% She is chest pain-free, cardiology cleared her to discharge home Close follow-up with cardiology  New onset of the A. Fib, with tendency of bradycardia Continue Xarelto, not able to tolerate beta-blocker due to bradycardia Cardiology plan on DCCV in 3 to 4 weeks if he remains in A. Fib Close follow-up with cardiology  PVD with left iliac vein thrombosis: Followed by Dr. Oneida Alar, continue Xarelto.  Hypertension Stable on home medication amlodipine, spironolactone, torsemide,  losartan  Hypokalemia, replaced  Noninsulin-dependent type 2 diabetes, uncontrolled -A1c 8.5 -Patient on Metformin intolerance due to diarrhea -She is on Victoza at home, continue, close follow-up with PCP   Class III obesity Body mass index is 48.23 kg/m. Lifestyle  modification  Current every day smoker Encouraged smoking cessation   Procedures:  Cardiac cath  Consultations:  Cardiology  Discharge Exam: BP 109/63 (BP Location: Left Arm)   Pulse 64   Temp 98.4 F (36.9 C) (Oral)   Resp 20   Ht 5\' 4"  (1.626 m)   Wt 127.5 kg   SpO2 98%   BMI 48.23 kg/m   General: NAD, pleasant Cardiovascular: I RRR, bradycardia Respiratory: CTABL Extremity: Chronic venous stasis changes  Discharge Instructions You were cared for by a hospitalist during your hospital stay. If you have any questions about your discharge medications or the care you received while you were in the hospital after you are discharged, you can call the unit and asked to speak with the hospitalist on call if the hospitalist that took care of you is not available. Once you are discharged, your primary care physician will handle any further medical issues. Please note that NO REFILLS for any discharge medications will be authorized once you are discharged, as it is imperative that you return to your primary care physician (or establish a relationship with a primary care physician if you do not have one) for your aftercare needs so that they can reassess your need for medications and monitor your lab values.  Discharge Instructions    Amb Referral to Cardiac Rehabilitation   Complete by: As directed    Diagnosis:  Coronary Stents PTCA NSTEMI     After initial evaluation and assessments completed: Virtual Based Care may be provided alone or in conjunction with Phase 2 Cardiac Rehab based on patient barriers.: Yes   Diet - low sodium heart healthy   Complete by: As directed    Carb modified   Increase activity slowly   Complete by: As directed      Allergies as of 04/28/2019      Reactions   Penicillins Hives, Rash   Has patient had a PCN reaction causing immediate rash, facial/tongue/throat swelling, SOB or lightheadedness with hypotension: Yes Has patient had a PCN reaction  causing severe rash involving mucus membranes or skin necrosis: No Has patient had a PCN reaction that required hospitalization No Has patient had a PCN reaction occurring within the last 10 years: No If all of the above answers are "NO", then may proceed with Cephalosporin use.      Medication List    STOP taking these medications   benzonatate 100 MG capsule Commonly known as: TESSALON   doxycycline 100 MG capsule Commonly known as: VIBRAMYCIN   simvastatin 20 MG tablet Commonly known as: ZOCOR     TAKE these medications   ALBUTEROL IN Inhale 2 puffs into the lungs every 6 (six) hours as needed (wheezing, shortness of breath).   amLODipine 5 MG tablet Commonly known as: NORVASC Take 5 mg by mouth daily.   aspirin EC 81 MG tablet Take 81 mg by mouth.   atorvastatin 80 MG tablet Commonly known as: LIPITOR Take 1 tablet (80 mg total) by mouth daily at 6 PM.   BD Pen Needle Nano U/F 32G X 4 MM Misc Generic drug: Insulin Pen Needle USE FOR VICTOZA INJECTIONS   clopidogrel 75 MG tablet Commonly known as: PLAVIX Take 1 tablet (75 mg total) by  mouth daily with breakfast. Start taking on: April 29, 2019   fluticasone 50 MCG/ACT nasal spray Commonly known as: FLONASE Place 2 sprays into both nostrils as needed.   losartan 100 MG tablet Commonly known as: COZAAR Take 100 mg by mouth daily.   nitroGLYCERIN 0.4 MG SL tablet Commonly known as: NITROSTAT Place 1 tablet (0.4 mg total) under the tongue every 5 (five) minutes as needed for chest pain.   potassium chloride SA 20 MEQ tablet Commonly known as: KLOR-CON Take 1 tablet (20 mEq total) by mouth daily. What changed: when to take this   rivaroxaban 20 MG Tabs tablet Commonly known as: XARELTO Take 1 tablet (20 mg total) by mouth daily with supper.   spironolactone 25 MG tablet Commonly known as: ALDACTONE Take 25 mg by mouth daily.   torsemide 20 MG tablet Commonly known as: DEMADEX TAKE 2 TABS IN AM AND 1  TAB IN PM What changed:   how much to take  how to take this  when to take this  additional instructions   Victoza 18 MG/3ML Sopn Generic drug: liraglutide Inject 1.2 mg into the skin daily. Taking 1.2mg  daily   VITAMIN B-12 PO Take by mouth daily.      Allergies  Allergen Reactions  . Penicillins Hives and Rash    Has patient had a PCN reaction causing immediate rash, facial/tongue/throat swelling, SOB or lightheadedness with hypotension: Yes Has patient had a PCN reaction causing severe rash involving mucus membranes or skin necrosis: No Has patient had a PCN reaction that required hospitalization No Has patient had a PCN reaction occurring within the last 10 years: No If all of the above answers are "NO", then may proceed with Cephalosporin use.    Follow-up Information    Imogene Burn, PA-C Follow up on 05/10/2019.   Specialty: Cardiology Why: at 2pm for your follow up appt Contact information: [place] [address] [phone]        Asencion Noble, MD Follow up in 1 week(s).   Specialty: Internal Medicine Why: Hospital discharge follow-up, repeat CBC BMP at follow-up Contact information: [address] [place] [address] [phone]        Arnoldo Lenis, MD .   Specialty: Cardiology Contact information: [address] [place] [address] [phone]            The results of significant diagnostics from this hospitalization (including imaging, microbiology, ancillary and laboratory) are listed below for reference.    Significant Diagnostic Studies: DG Chest 2 View  Result Date: 04/25/2019 CLINICAL DATA:  Chest pain for 3 days. EXAM: CHEST - 2 VIEW COMPARISON:  01/03/2019 FINDINGS: The cardiomediastinal contours are normal. Mild bronchial thickening. Pulmonary vasculature is normal. No consolidation, pleural effusion, or pneumothorax. No acute osseous abnormalities are seen. Surgical hardware in the lower  cervical spine is partially included. IMPRESSION: Mild bronchial thickening. Electronically Signed   By: Keith Rake M.D.   On: 04/25/2019 22:33   DG Shoulder Right  Result Date: 04/26/2019 CLINICAL DATA:  Shoulder pain after fall EXAM: RIGHT SHOULDER - 2+ VIEW COMPARISON:  None. FINDINGS: No acute fracture or dislocation of the right shoulder. There is mild right acromioclavicular osteoarthrosis. IMPRESSION: Negative. Electronically Signed   By: Ulyses Jarred M.D.   On: 04/26/2019 00:42   ECHOCARDIOGRAM COMPLETE  Result Date: 04/28/2019    ECHOCARDIOGRAM REPORT   Patient Name:   Amy Tran Date of Exam: 04/28/2019 Medical Rec #:  [id number]         Height:       64.0 in Accession #:    [id number]        Weight:       281.0 lb Date of Birth:  [birth year]         BSA:          2.261 m Patient Age:    64 years          BP:           125/77 mmHg Patient Gender: F                 HR:           73 bpm. Exam Location:  Inpatient Procedure: 2D Echo and Strain Analysis Indications:    Acute myocardial infarction 410  History:        Patient has prior history of Echocardiogram examinations, most                 recent 01/30/2016. CAD, PAD, Arrythmias:Atrial Fibrillation;                 Risk Factors:Hypertension, Diabetes and Dyslipidemia. 04/27/2019                 stent placed in proximal RCA.  Sonographer:    Darlina Sicilian RDCS Referring Phys: [place]  1. Left ventricular ejection fraction, by estimation, is 55 to 60%. Left ventricular ejection fraction by 3D volume is 58 %. The left ventricle has normal function. The left ventricle has no regional wall motion abnormalities. The left ventricular internal cavity size was mildly dilated. There is mild left ventricular hypertrophy. Left ventricular diastolic parameters are indeterminate.  2. Right ventricular systolic function is normal. The right ventricular size is mildly enlarged. There is mildly elevated pulmonary artery systolic  pressure. The estimated right ventricular systolic pressure is 99991111 mmHg.  3. The mitral valve is normal in structure. Mild mitral valve regurgitation.  4. The aortic valve is tricuspid. Aortic valve regurgitation is not visualized. Mild aortic stenosis. Vmax 2.8 m/s, MG 31mmHg, AVA 1.4 cm^2, DI 0.55  5. Aortic dilatation noted. There is mild dilatation of the ascending aorta measuring 36 mm.  6. The inferior vena cava is dilated in size with <50% respiratory variability, suggesting right atrial pressure of 15 mmHg. FINDINGS  Left Ventricle: Left ventricular ejection fraction, by estimation, is 55 to 60%. Left ventricular ejection fraction by 3D volume is 58 % The left ventricle has normal function. The left ventricle has no regional wall motion abnormalities. The left ventricular internal cavity size was mildly dilated. There is mild left ventricular hypertrophy. Left ventricular diastolic parameters are indeterminate. Right Ventricle: The right ventricular size is mildly enlarged. No increase in right ventricular wall thickness. Right ventricular systolic function is normal. There is mildly elevated pulmonary artery systolic pressure. The tricuspid regurgitant velocity is 2.25 m/s, and with an assumed right atrial pressure of 15 mmHg, the estimated right ventricular systolic pressure is 99991111 mmHg. Left Atrium: Left atrial size was normal in size. Right Atrium: Right atrial size was normal in size. Pericardium: There is no evidence of pericardial effusion. Mitral Valve: The mitral valve is normal in structure. Mild mitral valve regurgitation. Tricuspid Valve: The tricuspid valve is normal in structure. Tricuspid valve regurgitation is trivial. Aortic Valve: The aortic valve is tricuspid. Aortic valve regurgitation is not visualized. Mild aortic stenosis is present. Aortic valve mean gradient measures  7.0 mmHg. Aortic valve peak gradient measures 13.8 mmHg. Aortic valve area, by VTI measures 2.15 cm. Pulmonic  Valve: The pulmonic valve was not well visualized. Pulmonic valve regurgitation is trivial. Aorta: Aortic dilatation noted. There is mild dilatation of the ascending aorta measuring 36 mm. Venous: The inferior vena cava is dilated in size with less than 50% respiratory variability, suggesting right atrial pressure of 15 mmHg. IAS/Shunts: No atrial level shunt detected by color flow Doppler.  LEFT VENTRICLE PLAX 2D LVIDd:         5.40 cm LVIDs:         3.70 cm LV PW:         0.90 cm         3D Volume EF LV IVS:        1.00 cm         LV 3D EF:    Left LVOT diam:     1.80 cm                      ventricular LV SV:         72                           ejection LV SV Index:   32                           fraction by LVOT Area:     2.54 cm                     3D volume                                             is 58 %  LV Volumes (MOD) LV vol d, MOD    177.0 ml A2C: LV vol d, MOD    157.0 ml A4C: LV vol s, MOD    76.1 ml A2C: LV vol s, MOD    66.5 ml A4C: LV SV MOD A2C:   100.9 ml LV SV MOD A4C:   157.0 ml LV SV MOD BP:    96.6 ml RIGHT VENTRICLE RV S prime:     14.60 cm/s TAPSE (M-mode): 2.3 cm LEFT ATRIUM             Index       RIGHT ATRIUM           Index LA diam:        4.80 cm 2.12 cm/m  RA Area:     14.30 cm LA Vol (A2C):   62.0 ml 27.43 ml/m RA Volume:   34.80 ml  15.39 ml/m LA Vol (A4C):   66.4 ml 29.37 ml/m LA Biplane Vol: 64.8 ml 28.67 ml/m  AORTIC VALVE AV Area (Vmax):    1.97 cm AV Area (Vmean):   2.13 cm AV Area (VTI):     2.15 cm AV Vmax:           186.00 cm/s AV Vmean:          119.000 cm/s AV VTI:            0.334 m AV Peak Grad:      13.8 mmHg AV Mean Grad:      7.0 mmHg LVOT Vmax:  144.00 cm/s LVOT Vmean:        99.800 cm/s LVOT VTI:          0.282 m LVOT/AV VTI ratio: 0.84  AORTA Ao Root diam: 2.90 cm MITRAL VALVE                 TRICUSPID VALVE MV Area (PHT): 3.98 cm      TR Peak grad:   20.2 mmHg MV Decel Time: 191 msec      TR Vmax:        225.00 cm/s MR Peak grad:    85.0 mmHg  MR Mean grad:    58.0 mmHg   SHUNTS MR Vmax:         461.00 cm/s Systemic VTI:  0.28 m MR Vmean:        366.0 cm/s  Systemic Diam: 1.80 cm MR PISA:         0.25 cm MR PISA Eff ROA: 2 mm MR PISA Radius:  0.20 cm MV E velocity: 158.00 cm/s Oswaldo Milian MD Electronically signed by Oswaldo Milian MD Signature Date/Time: 04/28/2019/2:13:58 PM    Final     Microbiology: Recent Results (from the past 240 hour(s))  SARS CORONAVIRUS 2 (TAT 6-24 HRS) Nasopharyngeal Nasopharyngeal Swab     Status: None   Collection Time: 04/25/19 11:57 PM   Specimen: Nasopharyngeal Swab  Result Value Ref Range Status   SARS Coronavirus 2 NEGATIVE NEGATIVE Final    Comment: (NOTE) SARS-CoV-2 target nucleic acids are NOT DETECTED. The SARS-CoV-2 RNA is generally detectable in upper and lower respiratory specimens during the acute phase of infection. Negative results do not preclude SARS-CoV-2 infection, do not rule out co-infections with other pathogens, and should not be used as the sole basis for treatment or other patient management decisions. Negative results must be combined with clinical observations, patient history, and epidemiological information. The expected result is Negative. Fact Sheet for Patients: SugarRoll.be Fact Sheet for Healthcare Providers: https://www.woods-mathews.com/ This test is not yet approved or cleared by the [place] FDA and  has been authorized for detection and/or diagnosis of SARS-CoV-2 by FDA under an Emergency Use Authorization (EUA). This EUA will remain  in effect (meaning this test can be used) for the duration of the COVID-19 declaration under Section 56 4(b)(1) of the Act, 21 U.S.C. section 360bbb-3(b)(1), unless the authorization is terminated or revoked sooner. Performed at [facility], [place] [address], [place], [place] [address]      Labs: Basic Metabolic Panel: Recent Labs  Lab 04/25/19 2155  04/26/19 1008 04/27/19 0242 04/28/19 0436  NA 135 136 137 139  K 2.9* 3.0* 3.6 3.9  CL 97* 101 99 103  CO2 27 28 28 25   GLUCOSE 268* 166* 190* 144*  BUN 15 12 16 14   CREATININE 0.92 0.68 0.81 0.74  CALCIUM 9.3 9.1 9.2 8.9   Liver Function Tests: No results for input(s): AST, ALT, ALKPHOS, BILITOT, PROT, ALBUMIN in the last 168 hours. No results for input(s): LIPASE, AMYLASE in the last 168 hours. No results for input(s): AMMONIA in the last 168 hours. CBC: Recent Labs  Lab 04/25/19 2155 04/27/19 0242 04/28/19 0436  WBC 10.9* 8.8 8.1  HGB 12.8 11.8* 11.1*  HCT 40.7 36.5 35.1*  MCV 85.3 84.7 87.1  PLT 302 268 259   Cardiac Enzymes: No results for input(s): CKTOTAL, CKMB, CKMBINDEX, TROPONINI in the last 168 hours. BNP: BNP (last 3 results) Recent Labs    01/03/19 2339  BNP  138.0*    ProBNP (last 3 results) No results for input(s): PROBNP in the last 8760 hours.  CBG: Recent Labs  Lab 04/27/19 1306 04/27/19 1628 04/27/19 2219 04/28/19 0713 04/28/19 1133  GLUCAP 138* 203* 117* 153* 179*       Signed:  Florencia Reasons MD, PhD, FACP  [facility] 04/28/2019, 3:02 PM

## 2019-04-28 NOTE — Progress Notes (Addendum)
Progress Note  Patient Name: Amy Tran Date of Encounter: 04/28/2019  Primary Cardiologist: Carlyle Dolly, MD   Subjective   No chest pain this morning.   Inpatient Medications    Scheduled Meds: . amLODipine  5 mg Oral Daily  . aspirin EC  81 mg Oral Daily  . atorvastatin  80 mg Oral q1800  . clopidogrel  75 mg Oral Q breakfast  . insulin aspart  0-15 Units Subcutaneous TID WC  . insulin aspart  0-5 Units Subcutaneous QHS  . losartan  100 mg Oral Daily  . rivaroxaban  20 mg Oral Daily  . sodium chloride flush  3 mL Intravenous Q12H  . spironolactone  25 mg Oral Daily  . torsemide  60 mg Oral Daily   Continuous Infusions: . sodium chloride     PRN Meds: sodium chloride, acetaminophen, diazepam, nitroGLYCERIN, ondansetron (ZOFRAN) IV, sodium chloride flush   Vital Signs    Vitals:   04/27/19 1616 04/27/19 1646 04/27/19 2033 04/28/19 0616  BP: 121/64 136/75 122/84 138/60  Pulse: (!) 48 73 (!) 48 (!) 54  Resp:   20 20  Temp:   98.8 F (37.1 C) 98.4 F (36.9 C)  TempSrc:   Oral Oral  SpO2: 97% 99% 97% 96%  Weight:    127.5 kg  Height:        Intake/Output Summary (Last 24 hours) at 04/28/2019 0711 Last data filed at 04/27/2019 2100 Gross per 24 hour  Intake 3547.19 ml  Output --  Net 3547.19 ml   Last 3 Weights 04/28/2019 04/26/2019 04/25/2019  Weight (lbs) 281 lb 273 lb 12.8 oz 276 lb  Weight (kg) 127.461 kg 124.195 kg 125.193 kg      Telemetry    Afib rate controlled - Personally Reviewed  ECG    No new tracing this morning.   Physical Exam  Pleasant older WF, sitting up in bed.  GEN: No acute distress.   Neck: No JVD Cardiac: Irreg Irreg no murmurs, rubs, or gallops.  Respiratory: Clear to auscultation bilaterally. GI: Soft, nontender, non-distended  MS: No edema; No deformity. Right radial cath site stable mild bruising note.  Neuro:  Nonfocal  Psych: Normal affect   Labs    High Sensitivity Troponin:   Recent Labs  Lab  04/25/19 2155 04/26/19 0010 04/26/19 0242 04/26/19 0538 04/27/19 1033  TROPONINIHS 890* 820* 2,320* 6,761* 3,815*      Chemistry Recent Labs  Lab 04/26/19 1008 04/27/19 0242 04/28/19 0436  NA 136 137 139  K 3.0* 3.6 3.9  CL 101 99 103  CO2 28 28 25   GLUCOSE 166* 190* 144*  BUN 12 16 14   CREATININE 0.68 0.81 0.74  CALCIUM 9.1 9.2 8.9  GFRNONAA >60 >60 >60  GFRAA >60 >60 >60  ANIONGAP 7 10 11      Hematology Recent Labs  Lab 04/25/19 2155 04/27/19 0242 04/28/19 0436  WBC 10.9* 8.8 8.1  RBC 4.77 4.31 4.03  HGB 12.8 11.8* 11.1*  HCT 40.7 36.5 35.1*  MCV 85.3 84.7 87.1  MCH 26.8 27.4 27.5  MCHC 31.4 32.3 31.6  RDW 14.3 14.6 14.8  PLT 302 268 259    BNPNo results for input(s): BNP, PROBNP in the last 168 hours.   DDimer No results for input(s): DDIMER in the last 168 hours.   Radiology    No results found.  Cardiac Studies   Cath: 04/27/19   Prox RCA lesion is 95% stenosed.  Post intervention, there is a  0% residual stenosis.  A stent was successfully placed.   Acute coronary syndrome secondary to high-grade thrombotic stenosis in the very proximal RCA in a dominant RCA vessel.  Normal left coronary circulation with a short left main, large LAD and left circumflex vessels.  Dominant RCA with 95% very proximal stenosis with thrombus burden with TIMI-3 flow.  Successful percutaneous coronary prevention with PTCA and ultimate stenting with a 3.0 x18 mm Resolute Onyx DES stent postdilated to 3.73 mm tapering to 3.68 mm with the percent stenosis being reduced to 0% and brisk TIMI-3 flow without evidence for dissection.  RECOMMENDATION: Consider initial triple drug therapy for 1 month with continuation of Plavix/Xarelto for at least 6 months in this patient on long-term anticoagulation therapy.  Diagnostic Dominance: Right  Intervention    Echo: pending  Patient Profile     64 y.o. female with a hx of hypertension, hyperlipidemia,left  iliac thrombosison Xarelto, diabetes mellitus and asthma, new afibwho was seen for the evaluation of chest painat the request of Dr. Humphrey Rolls. Transferred to [facility] for further management with cardiac cath.   Assessment & Plan    1. NSTEMI: hsTn peaked at 6761. Underwent cardiac cath yesterday noted above with high grade lesion in the pRCA treated with PCI/DES x1. Placed on triple therapy with ASA/plavix with plans to resume Xarelto today for one month, then stop ASA. No chest pain overnight. CR to see and ambulate this morning. Remains on statin. No room for BB therapy with bradycardia.  -- echo pending. If stable anticipate DC today.   2. New onset Afib: rates are controlled in the 50-60s. Patient was unaware of rhythm on admission. This patients CHA2DS2-VASc Score of at least 3. Was on Xarelto prior to admission for iliac occlusion, will resume today. Will need to be on 3-4 weeks of therapy then consider outpatient DCCV at that time if remains in Afib.   3. HTN: stable with current therapy.   4. HL: on Zocor prior to admission. LDL 111, switched to atorvastatin 80mg .  -- will need FLP/LFTs in 8 weeks.   5. DM: metformin held for cath. On SSI. Hgb A1c 8.5  6. PVD with left iliac vein thrombosis: followed by Dr. Oneida Alar, as above on Xarelto PTA  7. Hypokalemia: resolved.    For questions or updates, please contact [place] Please consult [email or web address] for contact info under        Signed, Reino Bellis, NP  04/28/2019, 7:11 AM    I have examined the patient and reviewed assessment and plan and discussed with patient.  Agree with above as stated.  THrombotic lesion in the RCA.  Will plan for triple therapy for 1 month and then stop aspirin after a month and continue Xarelto and PLavix.  Mild bruising and swelling at right radial site. [facility] for discharge later today.  AFib is rate controlled and she is asymptomatic.   Larae Grooms

## 2019-05-04 NOTE — Progress Notes (Signed)
Cardiology Office Note    Date:  05/10/2019   ID:  Amy Tran, DOB 15-Jul-1955, MRN [id number]  PCP:  Asencion Noble, MD  Cardiologist: Carlyle Dolly, MD EPS: None  Chief Complaint  Patient presents with  . Hospitalization Follow-up    History of Present Illness:  Amy Tran is a 64 y.o. 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 fib and chest pain.  She had an NSTEMI and was transferred to [facility] for cardiac cath.  Troponins peaked at 6761 when she underwent DES x1 to the proximal RCA.  She was placed on triple therapy with aspirin and Plavix and Xarelto for 1 month with plans to stop the aspirin after that.  Atrial Fibrillation was new for her and she was asymptomatic.  CHA2DS2-VASc equals 3 on Xarelto for iliac occlusion.  Plan for 3 to 4 weeks of therapy and consider outpatient DCCV.  Zocor switch to Lipitor because of LDL of 111.  2D echo normal LV function mild aortic stenosis mild dilatation of the ascending aorta 36 mm  Patient comes in for f/u. Has no energy and is very tired. Denies chest pain. Has some dyspnea on exertion. 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 Smoking 15 cigarettes/day. Can't tell if she's in Afib.  Past Medical History:  Diagnosis Date  . Arthritis   . Asthma   . Cancer [facility])    multiple skin cancers  . Diabetes mellitus without complication ([place])   . Diverticulosis   . Fluid retention   . GERD (gastroesophageal reflux disease)   . Heart murmur   . High cholesterol   . Hypertension   . Incomplete rotator cuff tear   . OSA (obstructive sleep apnea)   . PONV (postoperative nausea and vomiting)   . Wears glasses   . Wears partial dentures    bottom    Past Surgical History:  Procedure Laterality Date  . APPENDECTOMY    . CARPAL TUNNEL RELEASE Bilateral 2001   bilateral  . CERVICAL POLYPECTOMY  04/08/2017   Procedure: POLYPECTOMY;   Surgeon: [name], MD;  Location: [facility] ORS;  Service: Gynecology;;  Endometrial  . CHOLECYSTECTOMY    . COLONOSCOPY  2007   [id number] internal hemorrhoids. Diminutive rectal polyp at 10 cm, cold biopsied/removed. The remainder of the rectal mucosa appeared normal Swallow left-sided diverticula. Diminutive polyp at the splenic flexure cold biopsied/removed (adenomatous)  . COLONOSCOPY N/A 12/28/2013   Procedure: COLONOSCOPY;  Surgeon: Daneil Dolin, MD;  Location: [facility] ENDO SUITE;  Service: Endoscopy;  Laterality: N/A;  730 - moved to 8:30 - Ginger notified pt  . CORONARY STENT INTERVENTION N/A 04/27/2019   Procedure: CORONARY STENT INTERVENTION;  Surgeon: Troy Sine, MD;  Location: [place] [facility];  Service: Cardiovascular;  Laterality: N/A;  . DILATION AND CURETTAGE, DIAGNOSTIC / THERAPEUTIC  03/07/2017  . ERCP  2002  . GASTRIC BYPASS  2004  . HYSTEROSCOPY WITH D & C N/A 04/08/2017   Procedure: DILATATION AND CURETTAGE /HYSTEROSCOPY;  Surgeon: [name], MD;  Location: [facility] ORS;  Service: Gynecology;  Laterality: N/A;  . INTRAVASCULAR ULTRASOUND/IVUS N/A 09/12/2017   Procedure: INTRAVASCULAR ULTRASOUND/IVUS;  Surgeon: Elam Dutch, MD;  Location: [place] [facility];  Service: Cardiovascular;  Laterality: N/A;  . LEFT HEART CATH AND CORONARY ANGIOGRAPHY N/A 04/27/2019   Procedure: LEFT HEART CATH AND CORONARY ANGIOGRAPHY;  Surgeon:  Troy Sine, MD;  Location: [place] [facility];  Service: Cardiovascular;  Laterality: N/A;  . NECK SURGERY  1998   fusion  . PERCUTANEOUS VENOUS THROMBECTOMY,LYSIS WITH INTRAVASCULAR ULTRASOUND (IVUS) Left 09/13/2017   Procedure: REMOVAL OF LYSIS CATHETER AND INTRAVASCULAR ULTRASOUND (IVUS) LEFT ILIAC VEIN;  Surgeon: Elam Dutch, MD;  Location: [place];  Service: Vascular;  Laterality: Left;  . PERIPHERAL VASCULAR INTERVENTION  09/15/2017   Procedure: PERIPHERAL VASCULAR INTERVENTION;  Surgeon: Waynetta Sandy, MD;  Location: [place] [facility];  Service: Cardiovascular;;  VEINOUS/STENT  . PERIPHERAL VASCULAR THROMBECTOMY Left 09/12/2017   Procedure: PERIPHERAL VASCULAR THROMBECTOMY;  Surgeon: Elam Dutch, MD;  Location: [place] [facility];  Service: Cardiovascular;  Laterality: Left;  . PERIPHERAL VASCULAR THROMBECTOMY Left 09/15/2017   Procedure: PERIPHERAL VASCULAR THROMBECTOMY;  Surgeon: Waynetta Sandy, MD;  Location: [place] [facility];  Service: Cardiovascular;  Laterality: Left;  IVC TO LT FEM/POP VEIN  . SHOULDER ARTHROSCOPY WITH SUBACROMIAL DECOMPRESSION Left 06/23/2013   Procedure: LEFT SHOULDER ARTHROSCOPY WITH DEBRIDEMENT ROTATOR CUFF AND LABRUM;  Surgeon: Lorn Junes, MD;  Location: [place];  Service: Orthopedics;  Laterality: Left;  . SHOULDER SURGERY  1999   left    Current Medications: Current Meds  Medication Sig  . ALBUTEROL IN Inhale 2 puffs into the lungs every 6 (six) hours as needed (wheezing, shortness of breath).   Marland Kitchen amLODipine (NORVASC) 5 MG tablet Take 5 mg by mouth daily.  Marland Kitchen atorvastatin (LIPITOR) 80 MG tablet Take 1 tablet (80 mg total) by mouth daily at 6 PM.  . BD PEN NEEDLE NANO U/F 32G X 4 MM MISC USE FOR VICTOZA INJECTIONS  . clopidogrel (PLAVIX) 75 MG tablet Take 1 tablet (75 mg total) by mouth daily with breakfast.  . Cyanocobalamin (VITAMIN B-12 PO) Take by mouth daily.  . fluticasone (FLONASE) 50 MCG/ACT nasal spray Place 2 sprays into both nostrils as needed.   Marland Kitchen losartan (COZAAR) 100 MG tablet Take 100 mg by mouth daily.  . nitroGLYCERIN (NITROSTAT) 0.4 MG SL tablet Place 1 tablet (0.4 mg total) under the tongue every 5 (five) minutes as needed for chest pain.  . potassium chloride SA (K-DUR,KLOR-CON) 20 MEQ tablet Take 1 tablet (20 mEq total) by mouth daily. (Patient taking differently: Take 60 mEq by mouth daily. )  . rivaroxaban (XARELTO) 20 MG TABS tablet Take 1 tablet (20 mg total) by mouth daily with supper.  Marland Kitchen spironolactone (ALDACTONE)  25 MG tablet Take 25 mg by mouth daily.  Marland Kitchen torsemide (DEMADEX) 20 MG tablet TAKE 2 TABS IN AM AND 1 TAB IN PM (Patient taking differently: Take 60 mg by mouth daily. )  . VICTOZA 18 MG/3ML SOPN Inject 1.2 mg into the skin daily. Taking 1.2mg  daily  . [DISCONTINUED] aspirin EC 81 MG tablet Take 81 mg by mouth.      Allergies:   Penicillins   Social History   Socioeconomic History  . Marital status: Widowed    Spouse name: Not on file  . Number of children: 2  . Years of education: Not on file  . Highest education level: Not on file  Occupational History  . Occupation: works at [employer]: [employer]   Tobacco Use  . Smoking status: Current Every Day Smoker    Packs/day: 0.50    Years: 29.00    Pack years: 14.50    Types: Cigarettes    Start date: 04/09/1970  .  Smokeless tobacco: Never Used  Substance and Sexual Activity  . Alcohol use: Not Currently    Comment: occ beer  . Drug use: No  . Sexual activity: Not Currently    Birth control/protection: Post-menopausal  Other Topics Concern  . Not on file  Social History Narrative  . Not on file   Social Determinants of Health   Financial Resource Strain:   . Difficulty of Paying Living Expenses:   Food Insecurity:   . Worried About Charity fundraiser in the Last Year:   . Arboriculturist in the Last Year:   Transportation Needs:   . Film/video editor (Medical):   Marland Kitchen Lack of Transportation (Non-Medical):   Physical Activity:   . Days of Exercise per Week:   . Minutes of Exercise per Session:   Stress:   . Feeling of Stress :   Social Connections:   . Frequency of Communication with Friends and Family:   . Frequency of Social Gatherings with Friends and Family:   . Attends Religious Services:   . Active Member of Clubs or Organizations:   . Attends Archivist Meetings:   Marland Kitchen Marital Status:      Family History:  The patient's family history includes Cancer in her daughter and father; Diabetes in  her brother and father; Drug abuse in her sister; Heart disease in her mother; Hypertension in her brother, father, and mother; Mesothelioma in her brother; Parkinson's disease in her father.   ROS:   Please see the history of present illness.    ROS All other systems reviewed and are negative.   PHYSICAL EXAM:   VS:  BP 126/70 (BP Location: Left Arm)   Pulse 78   Ht 5\' 4"  (1.626 m)   Wt 278 lb (126.1 kg)   SpO2 97%   BMI 47.72 kg/m   Physical Exam  GEN: Well nourished, well developed, in no acute distress  Neck: no JVD, carotid bruits, or masses Cardiac:RRR; no murmurs, rubs, or gallops  Respiratory:  clear to auscultation bilaterally, normal work of breathing GI: soft, nontender, nondistended, + BS Ext: right arm at cath site without hematoma or hemorrhage. Lower extremities with chronic edema R>L   Neuro:  Alert and Oriented x 3 Psych: euthymic mood, full affect  Wt Readings from Last 3 Encounters:  05/10/19 278 lb (126.1 kg)  04/28/19 281 lb (127.5 kg)  02/26/19 277 lb 3.2 oz (125.7 kg)      Studies/Labs Reviewed:   EKG:  EKG is ordered today.  The ekg ordered today demonstrates atrial fibrillation at 56 bpm, low voltage, nonspecific ST-T wave changes, no acute change  Recent Labs: 01/03/2019: ALT 13; B Natriuretic Peptide 138.0 04/28/2019: BUN 14; Creatinine, Ser 0.74; Hemoglobin 11.1; Platelets 259; Potassium 3.9; Sodium 139   Lipid Panel    Component Value Date/Time   CHOL 174 04/26/2019 0010   TRIG 110 04/26/2019 0010   HDL 41 04/26/2019 0010   CHOLHDL 4.2 04/26/2019 0010   VLDL 22 04/26/2019 0010   LDLCALC 111 (H) 04/26/2019 0010    Additional studies/ records that were reviewed today include:  Cath: 04/27/19    Prox RCA lesion is 95% stenosed.  Post intervention, there is a 0% residual stenosis.  A stent was successfully placed.   Acute coronary syndrome secondary to high-grade thrombotic stenosis in the very proximal RCA in a dominant RCA  vessel.   Normal left coronary circulation with a short left main, large LAD  and left circumflex vessels.   Dominant RCA with 95% very proximal stenosis with thrombus burden with TIMI-3 flow.   Successful percutaneous coronary prevention with PTCA and ultimate stenting with a 3.0 x18 mm Resolute Onyx DES stent postdilated to 3.73 mm tapering to 3.68 mm with the percent stenosis being reduced to 0% and brisk TIMI-3 flow without evidence for dissection.   RECOMMENDATION: Consider initial triple drug therapy for 1 month with continuation of Plavix/Xarelto for at least 6 months in this patient on long-term anticoagulation therapy.   Diagnostic Dominance: Right  Intervention        2D echo 04/28/2019 IMPRESSIONS     1. Left ventricular ejection fraction, by estimation, is 55 to 60%. Left  ventricular ejection fraction by 3D volume is 58 %. The left ventricle has  normal function. The left ventricle has no regional wall motion  abnormalities. The left ventricular  internal cavity size was mildly dilated. There is mild left ventricular  hypertrophy. Left ventricular diastolic parameters are indeterminate.   2. Right ventricular systolic function is normal. The right ventricular  size is mildly enlarged. There is mildly elevated pulmonary artery  systolic pressure. The estimated right ventricular systolic pressure is  99991111 mmHg.   3. The mitral valve is normal in structure. Mild mitral valve  regurgitation.   4. The aortic valve is tricuspid. Aortic valve regurgitation is not  visualized. Mild aortic stenosis. Vmax 2.8 m/s, MG 81mmHg, AVA 1.4 cm^2,  DI 0.55   5. Aortic dilatation noted. There is mild dilatation of the ascending  aorta measuring 36 mm.   6. The inferior vena cava is dilated in size with <50% respiratory  variability, suggesting right atrial pressure of 15 mmHg.   FINDINGS   Left Ventricle: Left ventricular ejection fraction, by estimation, is 55  to 60%. Left  ventricular ejection fraction by 3D volume is 58 % The left  ventricle has normal function. The left ventricle has no regional wall  motion abnormalities. The left  ventricular internal cavity size was mildly dilated. There is mild left  ventricular hypertrophy. Left ventricular diastolic parameters are  indeterminate.   Right Ventricle: The right ventricular size is mildly enlarged. No  increase in right ventricular wall thickness. Right ventricular systolic  function is normal. There is mildly elevated pulmonary artery systolic  pressure. The tricuspid regurgitant  velocity is 2.25 m/s, and with an assumed right atrial pressure of 15  mmHg, the estimated right ventricular systolic pressure is 99991111 mmHg.   Left Atrium: Left atrial size was normal in size.   Right Atrium: Right atrial size was normal in size.   Pericardium: There is no evidence of pericardial effusion.   Mitral Valve: The mitral valve is normal in structure. Mild mitral valve  regurgitation.   Tricuspid Valve: The tricuspid valve is normal in structure. Tricuspid  valve regurgitation is trivial.   Aortic Valve: The aortic valve is tricuspid. Aortic valve regurgitation is  not visualized. Mild aortic stenosis is present. Aortic valve mean  gradient measures 7.0 mmHg. Aortic valve peak gradient measures 13.8 mmHg.  Aortic valve area, by VTI measures  2.15 cm.   Pulmonic Valve: The pulmonic valve was not well visualized. Pulmonic valve  regurgitation is trivial.   Aorta: Aortic dilatation noted. There is mild dilatation of the ascending  aorta measuring 36 mm.   Venous: The inferior vena cava is dilated in size with less than 50%  respiratory variability, suggesting right atrial pressure of 15 mmHg.  IAS/Shunts: No atrial level shunt detected by color flow Doppler.       ASSESSMENT:    1. Coronary artery disease involving native coronary artery of native heart without angina pectoris   2. Essential  hypertension   3. Hyperlipidemia, unspecified hyperlipidemia type   4. Diabetes mellitus without complication ([place])   5. PVD (peripheral vascular disease) ([place])   6. Ascending aorta dilatation ([facility])   7. Tobacco abuse      PLAN:  In order of problems listed above:  CAD status post NSTEMI treated with DES to the RCA.  Plans for triple therapy with Plavix/aspirin/Xarelto for 1 month then stop aspirin.  2D echo with normal LVEF 55 to 60%, no wall motion abnormality, normal LA size. no beta-blocker because of bradycardia. No angina. Complains of fatigue which I suspect is a combination of going back to work, Afib and recent MI. She will see how she feels this week at work and decide if she needs time off.  New onset atrial fibrillation on Xarelto for iliac occlusion.  Plan for DCCV in 3 to 4 weeks-still in Afib so will see back in 2 weeks to schedule DCCV. Hasn't missed any meds. No beta blocker because of bradycardia  Essential hypertension-BP controlled  Hyperlipidemia Zocor switch to atorvastatin.  Plan fasting lipid panels in 2 months  Diabetes mellitus-managed by PCP  PVD with left iliac vein thrombosis followed by Dr. Oneida Alar on Xarelto  Ascending Aortic dilatation 36 mm-need to follow yearly.  Tobacco abuse-still smoking 15 cigarettes daily. Smoking cessation discussed. Will give nicotine patches and advised not to smoke with the patches on.  Medication Adjustments/Labs and Tests Ordered: Current medicines are reviewed at length with the patient today.  Concerns regarding medicines are outlined above.  Medication changes, Labs and Tests ordered today are listed in the Patient Instructions below. Patient Instructions  Medication Instructions:  Your physician has recommended you make the following change in your medication:  Stop Aspirin on April 17  Continue to Take Plavix and Xarelto  Start Nicoderm patches   *If you need a refill on your cardiac medications before your next  appointment, please call your pharmacy*   Lab Work: Your physician recommends that you return for lab work in: 2 Months (Fasting)    If you have labs (blood work) drawn today and your tests are completely normal, you will receive your results only by: Marland Kitchen MyChart Message (if you have MyChart) OR . A paper copy in the mail If you have any lab test that is abnormal or we need to change your treatment, we will call you to review the results.   Testing/Procedures: NONE     Follow-Up: At [facility], you and your health needs are our priority.  As part of our continuing mission to provide you with exceptional heart care, we have created designated Provider Care Teams.  These Care Teams include your primary Cardiologist (physician) and Advanced Practice Providers (APPs -  Physician Assistants and Nurse Practitioners) who all work together to provide you with the care you need, when you need it.  We recommend signing up for the patient portal called "MyChart".  Sign up information is provided on this After Visit Summary.  MyChart is used to connect with patients for Virtual Visits (Telemedicine).  Patients are able to view lab/test results, encounter notes, upcoming appointments, etc.  Non-urgent messages can be sent to your provider as well.   To learn more about what you can do with MyChart,  go to [email or web address].    Your next appointment:   2 week(s)  The format for your next appointment:   In Person  Provider:   Ermalinda Barrios, PA-C   Other Instructions Thank you for choosing [facility]!       Sumner Boast, PA-C  05/10/2019 2:44 PM    [facility] [address], [place], [place]  [address] Phone: [phone]; Fax: [phone]

## 2019-05-10 ENCOUNTER — Ambulatory Visit (INDEPENDENT_AMBULATORY_CARE_PROVIDER_SITE_OTHER): Payer: Medicaid Other | Admitting: Physician Assistant

## 2019-05-10 ENCOUNTER — Encounter: Payer: Self-pay | Admitting: Physician Assistant

## 2019-05-10 ENCOUNTER — Other Ambulatory Visit: Payer: Self-pay

## 2019-05-10 VITALS — BP 126/70 | HR 78 | Ht 64.0 in | Wt 278.0 lb

## 2019-05-10 DIAGNOSIS — I10 Essential (primary) hypertension: Secondary | ICD-10-CM | POA: Diagnosis not present

## 2019-05-10 DIAGNOSIS — E119 Type 2 diabetes mellitus without complications: Secondary | ICD-10-CM | POA: Diagnosis not present

## 2019-05-10 DIAGNOSIS — I2510 Atherosclerotic heart disease of native coronary artery without angina pectoris: Secondary | ICD-10-CM | POA: Diagnosis not present

## 2019-05-10 DIAGNOSIS — E785 Hyperlipidemia, unspecified: Secondary | ICD-10-CM

## 2019-05-10 DIAGNOSIS — I77810 Thoracic aortic ectasia: Secondary | ICD-10-CM

## 2019-05-10 DIAGNOSIS — I739 Peripheral vascular disease, unspecified: Secondary | ICD-10-CM

## 2019-05-10 DIAGNOSIS — Z720 Tobacco use: Secondary | ICD-10-CM

## 2019-05-10 MED ORDER — NICOTINE 21-14-7 MG/24HR TD KIT: 1.0000 | PACK | Freq: Every day | TRANSDERMAL | 0 refills | Status: DC

## 2019-05-10 NOTE — Patient Instructions (Signed)
Medication Instructions:  Your physician has recommended you make the following change in your medication:  Stop Aspirin on April 17  Continue to Take Plavix and Xarelto  Start Nicoderm patches   *If you need a refill on your cardiac medications before your next appointment, please call your pharmacy*   Lab Work: Your physician recommends that you return for lab work in: 2 Months (Fasting)    If you have labs (blood work) drawn today and your tests are completely normal, you will receive your results only by: Marland Kitchen MyChart Message (if you have MyChart) OR . A paper copy in the mail If you have any lab test that is abnormal or we need to change your treatment, we will call you to review the results.   Testing/Procedures: NONE     Follow-Up: At [facility], you and your health needs are our priority.  As part of our continuing mission to provide you with exceptional heart care, we have created designated Provider Care Teams.  These Care Teams include your primary Cardiologist (physician) and Advanced Practice Providers (APPs -  Physician Assistants and Nurse Practitioners) who all work together to provide you with the care you need, when you need it.  We recommend signing up for the patient portal called "MyChart".  Sign up information is provided on this After Visit Summary.  MyChart is used to connect with patients for Virtual Visits (Telemedicine).  Patients are able to view lab/test results, encounter notes, upcoming appointments, etc.  Non-urgent messages can be sent to your provider as well.   To learn more about what you can do with MyChart, go to [email or web address].    Your next appointment:   2 week(s)  The format for your next appointment:   In Person  Provider:   Ermalinda Barrios, PA-C   Other Instructions Thank you for choosing [facility]!

## 2019-05-18 NOTE — H&P (View-Only) (Signed)
Cardiology Office Note    Date:  05/24/2019   ID:  Amy Tran, DOB [birth year], MRN [id number]  PCP:  Asencion Noble, MD  Cardiologist: Carlyle Dolly, MD EPS: None  No chief complaint on file.   History of Present Illness:  Amy Tran is a 64 y.o. 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 fib and chest pain.  She had an NSTEMI 04/25/19 and was transferred to [facility] for cardiac cath.  Troponins peaked at 6761 when she underwent DES x1 to the proximal RCA.  She was placed on triple therapy with aspirin and Plavix and Xarelto for 1 month with plans to stop the aspirin after that.  Atrial Fibrillation was new for her and she was asymptomatic.  CHA2DS2-VASc equals 3 on Xarelto for iliac occlusion.  Plan for 3 to 4 weeks of therapy and consider outpatient DCCV.  Zocor switch to Lipitor because of LDL of 111.  2D echo normal LV function mild aortic stenosis mild dilatation of the ascending aorta 36 mm   I saw patient 05/10/2019 at which time she had no energy and was very tired.  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  Patient comes in for f/u.  She continues to complain of fatigue.  She has cut back on her work hours but is still fatigued.  Started cardiac rehab this morning.  Was in A. fib and telemetry strips from rehab will be scanned into her chart.  She says she does not feel the A. fib.  She has not missed any doses of Xarelto or Plavix and is still on aspirin for a couple more days.  Denies chest pain or palpitations.  Still smoking just under a pack per day.  She says she is trying to quit.  Nicotine patches are on back order so she did not get them.   Past Medical History:  Diagnosis Date  . Arthritis   . Asthma   . Cancer [facility])    multiple skin cancers  . Diabetes mellitus without complication ([place])   . Diverticulosis    . Fluid retention   . GERD (gastroesophageal reflux disease)   . Heart murmur   . High cholesterol   . Hypertension   . Incomplete rotator cuff tear   . OSA (obstructive sleep apnea)   . PONV (postoperative nausea and vomiting)   . Wears glasses   . Wears partial dentures    bottom    Past Surgical History:  Procedure Laterality Date  . APPENDECTOMY    . CARPAL TUNNEL RELEASE Bilateral 2001   bilateral  . CERVICAL POLYPECTOMY  04/08/2017   Procedure: POLYPECTOMY;  Surgeon: [name], MD;  Location: [facility] ORS;  Service: Gynecology;;  Endometrial  . CHOLECYSTECTOMY    . COLONOSCOPY  2007   IPJ:[id number] internal hemorrhoids. Diminutive rectal polyp at 10 cm, cold biopsied/removed. The remainder of the rectal mucosa appeared normal Swallow left-sided diverticula. Diminutive polyp at the splenic flexure cold biopsied/removed (adenomatous)  . COLONOSCOPY N/A 12/28/2013   Procedure: COLONOSCOPY;  Surgeon: Daneil Dolin, MD;  Location: [facility] END[facility] SU[facility];  Service: Endoscopy;  Laterality: N/A;  730 - moved to 8:30 - Ginger notified pt  . CORONARY STENT INTERVENTION N/A 04/27/2019   Procedure: CORONARY STENT INTERVENTION;  Surgeon: Troy Sine, MD;  Location: [place]  CV LAB;  Service: Cardiovascular;  Laterality: N/A;  . DILATION AND CURETTAGE, DIAGNOSTIC / THERAPEUTIC  03/07/2017  . ERCP  2002  . GASTRIC BYPASS  2004  . HYSTEROSCOPY WITH D & C N/A 04/08/2017   Procedure: DILATATION AND CURETTAGE /HYSTEROSCOPY;  Surgeon: [name], MD;  Location: [facility];  Service: Gynecology;  Laterality: N/A;  . INTRAVASCULAR ULTRASOUND/IVUS N/A 09/12/2017   Procedure: INTRAVASCULAR ULTRASOUND/IVUS;  Surgeon: Elam Dutch, MD;  Location: [place] CV LAB;  Service: Cardiovascular;  Laterality: N/A;  . LEFT HEART CATH AND CORONARY ANGIOGRAPHY N/A 04/27/2019   Procedure: LEFT HEART CATH AND CORONARY ANGIOGRAPHY;  Surgeon: Troy Sine, MD;  Location: [place] CV LAB;  Service:  Cardiovascular;  Laterality: N/A;  . NECK SURGERY  1998   fusion  . PERCUTANEOUS VENOUS THROMBECTOMY,LYSIS WITH INTRAVASCULAR ULTRASOUND (IVUS) Left 09/13/2017   Procedure: REMOVAL OF LYSIS CATHETER AND INTRAVASCULAR ULTRASOUND (IVUS) LEFT ILIAC VEIN;  Surgeon: Elam Dutch, MD;  Location: [place];  Service: Vascular;  Laterality: Left;  . PERIPHERAL VASCULAR INTERVENTION  09/15/2017   Procedure: PERIPHERAL VASCULAR INTERVENTION;  Surgeon: Waynetta Sandy, MD;  Location: [place] CV LAB;  Service: Cardiovascular;;  VEINOUS/STENT  . PERIPHERAL VASCULAR THROMBECTOMY Left 09/12/2017   Procedure: PERIPHERAL VASCULAR THROMBECTOMY;  Surgeon: Elam Dutch, MD;  Location: [place] CV LAB;  Service: Cardiovascular;  Laterality: Left;  . PERIPHERAL VASCULAR THROMBECTOMY Left 09/15/2017   Procedure: PERIPHERAL VASCULAR THROMBECTOMY;  Surgeon: Waynetta Sandy, MD;  Location: [place] CV LAB;  Service: Cardiovascular;  Laterality: Left;  IVC TO LT FEM/POP VEIN  . SHOULDER ARTHROSCOPY WITH SUBACROMIAL DECOMPRESSION Left 06/23/2013   Procedure: LEFT SHOULDER ARTHROSCOPY WITH DEBRIDEMENT ROTATOR CUFF AND LABRUM;  Surgeon: Lorn Junes, MD;  Location: [place];  Service: Orthopedics;  Laterality: Left;  . SHOULDER SURGERY  1999   left    Current Medications: Current Meds  Medication Sig  . ALBUTEROL IN Inhale 2 puffs into the lungs every 6 (six) hours as needed (wheezing, shortness of breath).   Marland Kitchen amLODipine (NORVASC) 5 MG tablet Take 5 mg by mouth daily.  Marland Kitchen aspirin EC 81 MG tablet Take 81 mg by mouth daily.  Marland Kitchen atorvastatin (LIPITOR) 80 MG tablet Take 1 tablet (80 mg total) by mouth daily at 6 PM.  . BD PEN NEEDLE NANO U/F 32G X 4 MM MISC USE FOR VICTOZA INJECTIONS  . Canagliflozin (INVOKANA PO) Take by mouth daily. Pt unsure of dose  . cetirizine (ZYRTEC) 10 MG tablet Take 10 mg by mouth daily. For itchy palms  . clopidogrel (PLAVIX) 75 MG tablet Take 1  tablet (75 mg total) by mouth daily with breakfast.  . Cyanocobalamin (VITAMIN B-12 PO) Take 500 mcg by mouth daily.   . diphenhydrAMINE (BENADRYL) 2 % cream Apply 1 application topically 3 (three) times daily as needed for itching. For itchy palms  . fluticasone (FLONASE) 50 MCG/ACT nasal spray Place 2 sprays into both nostrils as needed.   Marland Kitchen losartan (COZAAR) 100 MG tablet Take 100 mg by mouth daily.  . nitroGLYCERIN (NITROSTAT) 0.4 MG SL tablet Place 1 tablet (0.4 mg total) under the tongue every 5 (five) minutes as needed for chest pain.  . potassium chloride SA (K-DUR,KLOR-CON) 20 MEQ tablet Take 1 tablet (20 mEq total) by mouth daily. (Patient taking differently: Take 20 mEq by mouth 3 (three) times daily. )  . rivaroxaban (XARELTO) 20 MG TABS tablet Take 1 tablet (20 mg total)  by mouth daily with supper.  Marland Kitchen spironolactone (ALDACTONE) 25 MG tablet Take 25 mg by mouth daily.  Marland Kitchen torsemide (DEMADEX) 20 MG tablet TAKE 2 TABS IN AM AND 1 TAB IN PM (Patient taking differently: 60 mg. )  . VICTOZA 18 MG/3ML SOPN Inject 1.8 mg into the skin daily. Taking 1.29m daily  . [DISCONTINUED] clopidogrel (PLAVIX) 75 MG tablet Take 1 tablet (75 mg total) by mouth daily with breakfast.  . [DISCONTINUED] Nicotine 21-14-7 MG/24HR KIT Place 1 kit onto the skin daily.     Allergies:   Penicillins   Social History   Socioeconomic History  . Marital status: Widowed    Spouse name: Not on file  . Number of children: 2  . Years of education: Not on file  . Highest education level: Not on file  Occupational History  . Occupation: works at [employer]   Tobacco Use  . Smoking status: Current Every Day Smoker    Packs/day: 0.50    Years: 29.00    Pack years: 14.50    Types: Cigarettes    Start date: 04/09/1970  . Smokeless tobacco: Never Used  Substance and Sexual Activity  . Alcohol use: Not Currently    Comment: occ beer  . Drug use: No  . Sexual activity: Not Currently    Birth  control/protection: Post-menopausal  Other Topics Concern  . Not on file  Social History Narrative  . Not on file   Social Determinants of Health   Financial Resource Strain:   . Difficulty of Paying Living Expenses:   Food Insecurity:   . Worried About RCharity fundraiserin the Last Year:   . RArboriculturistin the Last Year:   Transportation Needs:   . LFilm/video editor(Medical):   .Marland KitchenLack of Transportation (Non-Medical):   Physical Activity:   . Days of Exercise per Week:   . Minutes of Exercise per Session:   Stress:   . Feeling of Stress :   Social Connections:   . Frequency of Communication with Friends and Family:   . Frequency of Social Gatherings with Friends and Family:   . Attends Religious Services:   . Active Member of Clubs or Organizations:   . Attends CArchivistMeetings:   .Marland KitchenMarital Status:      Family History:  The patient's   family history includes Cancer in her daughter and father; Diabetes in her brother and father; Drug abuse in her sister; Heart disease in her mother; Hypertension in her brother, father, and mother; Mesothelioma in her brother; Parkinson's disease in her father.   ROS:   Please see the history of present illness.    ROS All other systems reviewed and are negative.   PHYSICAL EXAM:   VS:  BP 140/74   Pulse 68   Ht _0  (1.626 m)   Wt 279 lb (126.6 kg)   SpO2 95%   BMI 47.89 kg/m   Physical Exam  GEN: Obese, in no acute distress  Neck: Bilateral carotid bruits no JVD, or masses Cardiac:RRR; 2/6 systolic murmur at the left sternal border Respiratory:  clear to auscultation bilaterally, normal work of breathing GI: soft, nontender, nondistended, + BS Ext: Chronic edema bilaterally  Neuro:  Alert and Oriented x 3, Psych: euthymic mood, full affect  Wt Readings from Last 3 Encounters:  05/24/19 279 lb (126.6 kg)  05/21/19 279 lb 12.8 oz (126.9 kg)  05/10/19  278 lb (126.1 kg)      Studies/Labs Reviewed:     EKG:  EKG is not ordered today.  Telemetry strips from cardiac rehab reviewed from this morning and show A. fib with controlled rate Recent Labs: 01/03/2019: ALT 13; B Natriuretic Peptide 138.0 04/28/2019: BUN 14; Creatinine, Ser 0.74; Hemoglobin 11.1; Platelets 259; Potassium 3.9; Sodium 139   Lipid Panel    Component Value Date/Time   CHOL 174 04/26/2019 0010   TRIG 110 04/26/2019 0010   HDL 41 04/26/2019 0010   CHOLHDL 4.2 04/26/2019 0010   VLDL 22 04/26/2019 0010   LDLCALC 111 (H) 04/26/2019 0010    Additional studies/ records that were reviewed today include:  Cath: 04/27/19    Prox RCA lesion is 95% stenosed.  Post intervention, there is a 0% residual stenosis.  A stent was successfully placed.   Acute coronary syndrome secondary to high-grade thrombotic stenosis in the very proximal RCA in a dominant RCA vessel.   Normal left coronary circulation with a short left main, large LAD and left circumflex vessels.   Dominant RCA with 95% very proximal stenosis with thrombus burden with TIMI-3 flow.   Successful percutaneous coronary prevention with PTCA and ultimate stenting with a 3.0 x18 mm Resolute Onyx DES stent postdilated to 3.73 mm tapering to 3.68 mm with the percent stenosis being reduced to 0% and brisk TIMI-3 flow without evidence for dissection.   RECOMMENDATION: Consider initial triple drug therapy for 1 month with continuation of Plavix/Xarelto for at least 6 months in this patient on long-term anticoagulation therapy.    2D echo 04/28/2019 IMPRESSIONS     1. Left ventricular ejection fraction, by estimation, is 55 to 60%. Left  ventricular ejection fraction by 3D volume is 58 %. The left ventricle has  normal function. The left ventricle has no regional wall motion  abnormalities. The left ventricular  internal cavity size was mildly dilated. There is mild left ventricular  hypertrophy. Left ventricular diastolic parameters are indeterminate.   2.  Right ventricular systolic function is normal. The right ventricular  size is mildly enlarged. There is mildly elevated pulmonary artery  systolic pressure. The estimated right ventricular systolic pressure is  54.6 mmHg.   3. The mitral valve is normal in structure. Mild mitral valve  regurgitation.   4. The aortic valve is tricuspid. Aortic valve regurgitation is not  visualized. Mild aortic stenosis. Vmax 2.8 m/s, MG 15mHg, AVA 1.4 cm^2,  DI 0.55   5. Aortic dilatation noted. There is mild dilatation of the ascending  aorta measuring 36 mm.   6. The inferior vena cava is dilated in size with <50% respiratory  variability, suggesting right atrial pressure of 15 mmHg.   FINDINGS   Left Ventricle: Left ventricular ejection fraction, by estimation, is 55  to 60%. Left ventricular ejection fraction by 3D volume is 58 % The left  ventricle has normal function. The left ventricle has no regional wall  motion abnormalities. The left  ventricular internal cavity size was mildly dilated. There is mild left  ventricular hypertrophy. Left ventricular diastolic parameters are  indeterminate.   Right Ventricle: The right ventricular size is mildly enlarged. No  increase in right ventricular wall thickness. Right ventricular systolic  function is normal. There is mildly elevated pulmonary artery systolic  pressure. The tricuspid regurgitant  velocity is 2.25 m/s, and with an assumed right atrial pressure of 15  mmHg, the estimated right ventricular systolic pressure is 350.3mmHg.   Left  Atrium: Left atrial size was normal in size.   Right Atrium: Right atrial size was normal in size.   Pericardium: There is no evidence of pericardial effusion.   Mitral Valve: The mitral valve is normal in structure. Mild mitral valve  regurgitation.   Tricuspid Valve: The tricuspid valve is normal in structure. Tricuspid  valve regurgitation is trivial.   Aortic Valve: The aortic valve is tricuspid.  Aortic valve regurgitation is  not visualized. Mild aortic stenosis is present. Aortic valve mean  gradient measures 7.0 mmHg. Aortic valve peak gradient measures 13.8 mmHg.  Aortic valve area, by VTI measures  2.15 cm.   Pulmonic Valve: The pulmonic valve was not well visualized. Pulmonic valve  regurgitation is trivial.   Aorta: Aortic dilatation noted. There is mild dilatation of the ascending  aorta measuring 36 mm.   Venous: The inferior vena cava is dilated in size with less than 50%  respiratory variability, suggesting right atrial pressure of 15 mmHg.   IAS/Shunts: No atrial level shunt detected by color flow Doppler.      ASSESSMENT:    1. Coronary artery disease involving native coronary artery of native heart without angina pectoris   2. Persistent atrial fibrillation ([place])   3. Essential hypertension   4. Hyperlipidemia, unspecified hyperlipidemia type   5. Type 2 diabetes mellitus with complication, without long-term current use of insulin ([place])   6. PVD (peripheral vascular disease) ([place])   7. Aortic dilatation ([facility])   8. Bruit   9. Tobacco abuse   10. Fatigue, unspecified type      PLAN:  In order of problems listed above:  CAD status post NSTEMI treated with DES to the RCA.  Plans for triple therapy with Plavix/aspirin/Xarelto for 1 month then stop aspirin.  2D echo with normal LVEF 55 to 60%, no wall motion abnormality, normal LA size. no beta-blocker because of bradycardia. No angina.    New onset atrial fibrillation on Xarelto for iliac occlusion.  still in Afib so will schedule DCCV 06/03/19. Hasn't missed any meds. No beta blocker because of bradycardia   Essential hypertension-BP controlled   Hyperlipidemia Zocor switch to atorvastatin.  Plan fasting lipid panels in 2 months   Diabetes mellitus-managed by PCP   PVD with left iliac vein thrombosis followed by Dr. Oneida Alar on Xarelto   Ascending Aortic dilatation 36 mm-need to follow yearly.    Tobacco abuse-still smoking 15 cigarettes daily. Smoking cessation discussed. Will give nicotine patches and advised not to smoke with the patches on.   Carotid bruits check carotid Dopplers  Fatigue check TSH    Medication Adjustments/Labs and Tests Ordered: Current medicines are reviewed at length with the patient today.  Concerns regarding medicines are outlined above.  Medication changes, Labs and Tests ordered today are listed in the Patient Instructions below. Patient Instructions  Medication Instructions:  Your physician recommends that you continue on your current medications as directed. Please refer to the Current Medication list given to you today.   Labwork: CBC CMET TSH   Testing/Procedures: Your physician has recommended that you have a Cardioversion (DCCV). Electrical Cardioversion uses a jolt of electricity to your heart either through paddles or wired patches attached to your chest. This is a controlled, usually prescheduled, procedure. Defibrillation is done under light anesthesia in the hospital, and you usually go home the day of the procedure. This is done to get your heart back into a normal rhythm. You are not awake for the procedure. Please see  the instruction sheet given to you today.  Your physician has requested that you have a carotid duplex. This test is an ultrasound of the carotid arteries in your neck. It looks at blood flow through these arteries that supply the brain with blood. Allow one hour for this exam. There are no restrictions or special instructions.    Follow-Up: Your physician recommends that you schedule a follow-up appointment in: AFTER CARDIOVERSION     Any Other Special Instructions Will Be Listed Below (If Applicable).     If you need a refill on your cardiac medications before your next appointment, please call your pharmacy.      Sumner Boast, PA-C  05/24/2019 2:04 PM    [facility] [address], [place], [place]  [address] Phone: [phone]; Fax: [phone]

## 2019-05-18 NOTE — Progress Notes (Signed)
Cardiology Office Note    Date:  05/24/2019   ID:  Amy Tran, DOB [birth year], MRN [id number]  PCP:  Asencion Noble, MD  Cardiologist: Carlyle Dolly, MD EPS: None  No chief complaint on file.   History of Present Illness:  Amy Tran is a 64 y.o. 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 fib and chest pain.  She had an NSTEMI 04/25/19 and was transferred to [facility] for cardiac cath.  Troponins peaked at 6761 when she underwent DES x1 to the proximal RCA.  She was placed on triple therapy with aspirin and Plavix and Xarelto for 1 month with plans to stop the aspirin after that.  Atrial Fibrillation was new for her and she was asymptomatic.  CHA2DS2-VASc equals 3 on Xarelto for iliac occlusion.  Plan for 3 to 4 weeks of therapy and consider outpatient DCCV.  Zocor switch to Lipitor because of LDL of 111.  2D echo normal LV function mild aortic stenosis mild dilatation of the ascending aorta 36 mm   I saw patient 05/10/2019 at which time she had no energy and was very tired.  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  Patient comes in for f/u.  She continues to complain of fatigue.  She has cut back on her work hours but is still fatigued.  Started cardiac rehab this morning.  Was in A. fib and telemetry strips from rehab will be scanned into her chart.  She says she does not feel the A. fib.  She has not missed any doses of Xarelto or Plavix and is still on aspirin for a couple more days.  Denies chest pain or palpitations.  Still smoking just under a pack per day.  She says she is trying to quit.  Nicotine patches are on back order so she did not get them.   Past Medical History:  Diagnosis Date  . Arthritis   . Asthma   . Cancer [facility])    multiple skin cancers  . Diabetes mellitus without complication ([place])   . Diverticulosis    . Fluid retention   . GERD (gastroesophageal reflux disease)   . Heart murmur   . High cholesterol   . Hypertension   . Incomplete rotator cuff tear   . OSA (obstructive sleep apnea)   . PONV (postoperative nausea and vomiting)   . Wears glasses   . Wears partial dentures    bottom    Past Surgical History:  Procedure Laterality Date  . APPENDECTOMY    . CARPAL TUNNEL RELEASE Bilateral 2001   bilateral  . CERVICAL POLYPECTOMY  04/08/2017   Procedure: POLYPECTOMY;  Surgeon: [name], MD;  Location: [facility] ORS;  Service: Gynecology;;  Endometrial  . CHOLECYSTECTOMY    . COLONOSCOPY  2007   IPJ:[id number] internal hemorrhoids. Diminutive rectal polyp at 10 cm, cold biopsied/removed. The remainder of the rectal mucosa appeared normal Swallow left-sided diverticula. Diminutive polyp at the splenic flexure cold biopsied/removed (adenomatous)  . COLONOSCOPY N/A 12/28/2013   Procedure: COLONOSCOPY;  Surgeon: Daneil Dolin, MD;  Location: [facility] ENDO SUITE;  Service: Endoscopy;  Laterality: N/A;  730 - moved to 8:30 - Ginger notified pt  . CORONARY STENT INTERVENTION N/A 04/27/2019   Procedure: CORONARY STENT INTERVENTION;  Surgeon: Troy Sine, MD;  Location: [address]  CV LAB;  Service: Cardiovascular;  Laterality: N/A;  . DILATION AND CURETTAGE, DIAGNOSTIC / THERAPEUTIC  03/07/2017  . ERCP  2002  . GASTRIC BYPASS  2004  . HYSTEROSCOPY WITH D & C N/A 04/08/2017   Procedure: DILATATION AND CURETTAGE /HYSTEROSCOPY;  Surgeon: [name], MD;  Location: [facility] ORS;  Service: Gynecology;  Laterality: N/A;  . INTRAVASCULAR ULTRASOUND/IVUS N/A 09/12/2017   Procedure: INTRAVASCULAR ULTRASOUND/IVUS;  Surgeon: Elam Dutch, MD;  Location: [place] CV LAB;  Service: Cardiovascular;  Laterality: N/A;  . LEFT HEART CATH AND CORONARY ANGIOGRAPHY N/A 04/27/2019   Procedure: LEFT HEART CATH AND CORONARY ANGIOGRAPHY;  Surgeon: Troy Sine, MD;  Location: [place] CV LAB;  Service:  Cardiovascular;  Laterality: N/A;  . NECK SURGERY  1998   fusion  . PERCUTANEOUS VENOUS THROMBECTOMY,LYSIS WITH INTRAVASCULAR ULTRASOUND (IVUS) Left 09/13/2017   Procedure: REMOVAL OF LYSIS CATHETER AND INTRAVASCULAR ULTRASOUND (IVUS) LEFT ILIAC VEIN;  Surgeon: Elam Dutch, MD;  Location: [place];  Service: Vascular;  Laterality: Left;  . PERIPHERAL VASCULAR INTERVENTION  09/15/2017   Procedure: PERIPHERAL VASCULAR INTERVENTION;  Surgeon: Waynetta Sandy, MD;  Location: [place] CV LAB;  Service: Cardiovascular;;  VEINOUS/STENT  . PERIPHERAL VASCULAR THROMBECTOMY Left 09/12/2017   Procedure: PERIPHERAL VASCULAR THROMBECTOMY;  Surgeon: Elam Dutch, MD;  Location: [place] CV LAB;  Service: Cardiovascular;  Laterality: Left;  . PERIPHERAL VASCULAR THROMBECTOMY Left 09/15/2017   Procedure: PERIPHERAL VASCULAR THROMBECTOMY;  Surgeon: Waynetta Sandy, MD;  Location: [place] CV LAB;  Service: Cardiovascular;  Laterality: Left;  IVC TO LT FEM/POP VEIN  . SHOULDER ARTHROSCOPY WITH SUBACROMIAL DECOMPRESSION Left 06/23/2013   Procedure: LEFT SHOULDER ARTHROSCOPY WITH DEBRIDEMENT ROTATOR CUFF AND LABRUM;  Surgeon: Lorn Junes, MD;  Location: [place];  Service: Orthopedics;  Laterality: Left;  . SHOULDER SURGERY  1999   left    Current Medications: Current Meds  Medication Sig  . ALBUTEROL IN Inhale 2 puffs into the lungs every 6 (six) hours as needed (wheezing, shortness of breath).   Marland Kitchen amLODipine (NORVASC) 5 MG tablet Take 5 mg by mouth daily.  Marland Kitchen aspirin EC 81 MG tablet Take 81 mg by mouth daily.  Marland Kitchen atorvastatin (LIPITOR) 80 MG tablet Take 1 tablet (80 mg total) by mouth daily at 6 PM.  . BD PEN NEEDLE NANO U/F 32G X 4 MM MISC USE FOR VICTOZA INJECTIONS  . Canagliflozin (INVOKANA PO) Take by mouth daily. Pt unsure of dose  . cetirizine (ZYRTEC) 10 MG tablet Take 10 mg by mouth daily. For itchy palms  . clopidogrel (PLAVIX) 75 MG tablet Take 1  tablet (75 mg total) by mouth daily with breakfast.  . Cyanocobalamin (VITAMIN B-12 PO) Take 500 mcg by mouth daily.   . diphenhydrAMINE (BENADRYL) 2 % cream Apply 1 application topically 3 (three) times daily as needed for itching. For itchy palms  . fluticasone (FLONASE) 50 MCG/ACT nasal spray Place 2 sprays into both nostrils as needed.   Marland Kitchen losartan (COZAAR) 100 MG tablet Take 100 mg by mouth daily.  . nitroGLYCERIN (NITROSTAT) 0.4 MG SL tablet Place 1 tablet (0.4 mg total) under the tongue every 5 (five) minutes as needed for chest pain.  . potassium chloride SA (K-DUR,KLOR-CON) 20 MEQ tablet Take 1 tablet (20 mEq total) by mouth daily. (Patient taking differently: Take 20 mEq by mouth 3 (three) times daily. )  . rivaroxaban (XARELTO) 20 MG TABS tablet Take 1 tablet (20 mg total)  by mouth daily with supper.  Marland Kitchen spironolactone (ALDACTONE) 25 MG tablet Take 25 mg by mouth daily.  Marland Kitchen torsemide (DEMADEX) 20 MG tablet TAKE 2 TABS IN AM AND 1 TAB IN PM (Patient taking differently: 60 mg. )  . VICTOZA 18 MG/3ML SOPN Inject 1.8 mg into the skin daily. Taking 1.29m daily  . [DISCONTINUED] clopidogrel (PLAVIX) 75 MG tablet Take 1 tablet (75 mg total) by mouth daily with breakfast.  . [DISCONTINUED] Nicotine 21-14-7 MG/24HR KIT Place 1 kit onto the skin daily.     Allergies:   Penicillins   Social History   Socioeconomic History  . Marital status: Widowed    Spouse name: Not on file  . Number of children: 2  . Years of education: Not on file  . Highest education level: Not on file  Occupational History  . Occupation: works at [employer]   Tobacco Use  . Smoking status: Current Every Day Smoker    Packs/day: 0.50    Years: 29.00    Pack years: 14.50    Types: Cigarettes    Start date: 04/09/1970  . Smokeless tobacco: Never Used  Substance and Sexual Activity  . Alcohol use: Not Currently    Comment: occ beer  . Drug use: No  . Sexual activity: Not Currently    Birth  control/protection: Post-menopausal  Other Topics Concern  . Not on file  Social History Narrative  . Not on file   Social Determinants of Health   Financial Resource Strain:   . Difficulty of Paying Living Expenses:   Food Insecurity:   . Worried About RCharity fundraiserin the Last Year:   . RArboriculturistin the Last Year:   Transportation Needs:   . LFilm/video editor(Medical):   .Marland KitchenLack of Transportation (Non-Medical):   Physical Activity:   . Days of Exercise per Week:   . Minutes of Exercise per Session:   Stress:   . Feeling of Stress :   Social Connections:   . Frequency of Communication with Friends and Family:   . Frequency of Social Gatherings with Friends and Family:   . Attends Religious Services:   . Active Member of Clubs or Organizations:   . Attends CArchivistMeetings:   .Marland KitchenMarital Status:      Family History:  The patient's   family history includes Cancer in her daughter and father; Diabetes in her brother and father; Drug abuse in her sister; Heart disease in her mother; Hypertension in her brother, father, and mother; Mesothelioma in her brother; Parkinson's disease in her father.   ROS:   Please see the history of present illness.    ROS All other systems reviewed and are negative.   PHYSICAL EXAM:   VS:  BP 140/74   Pulse 68   Ht _0  (1.626 m)   Wt 279 lb (126.6 kg)   SpO2 95%   BMI 47.89 kg/m   Physical Exam  GEN: Obese, in no acute distress  Neck: Bilateral carotid bruits no JVD, or masses Cardiac:RRR; 2/6 systolic murmur at the left sternal border Respiratory:  clear to auscultation bilaterally, normal work of breathing GI: soft, nontender, nondistended, + BS Ext: Chronic edema bilaterally  Neuro:  Alert and Oriented x 3, Psych: euthymic mood, full affect  Wt Readings from Last 3 Encounters:  05/24/19 279 lb (126.6 kg)  05/21/19 279 lb 12.8 oz (126.9 kg)  05/10/19  278 lb (126.1 kg)      Studies/Labs Reviewed:     EKG:  EKG is not ordered today.  Telemetry strips from cardiac rehab reviewed from this morning and show A. fib with controlled rate Recent Labs: 01/03/2019: ALT 13; B Natriuretic Peptide 138.0 04/28/2019: BUN 14; Creatinine, Ser 0.74; Hemoglobin 11.1; Platelets 259; Potassium 3.9; Sodium 139   Lipid Panel    Component Value Date/Time   CHOL 174 04/26/2019 0010   TRIG 110 04/26/2019 0010   HDL 41 04/26/2019 0010   CHOLHDL 4.2 04/26/2019 0010   VLDL 22 04/26/2019 0010   LDLCALC 111 (H) 04/26/2019 0010    Additional studies/ records that were reviewed today include:  Cath: 04/27/19    Prox RCA lesion is 95% stenosed.  Post intervention, there is a 0% residual stenosis.  A stent was successfully placed.   Acute coronary syndrome secondary to high-grade thrombotic stenosis in the very proximal RCA in a dominant RCA vessel.   Normal left coronary circulation with a short left main, large LAD and left circumflex vessels.   Dominant RCA with 95% very proximal stenosis with thrombus burden with TIMI-3 flow.   Successful percutaneous coronary prevention with PTCA and ultimate stenting with a 3.0 x18 mm Resolute Onyx DES stent postdilated to 3.73 mm tapering to 3.68 mm with the percent stenosis being reduced to 0% and brisk TIMI-3 flow without evidence for dissection.   RECOMMENDATION: Consider initial triple drug therapy for 1 month with continuation of Plavix/Xarelto for at least 6 months in this patient on long-term anticoagulation therapy.    2D echo 04/28/2019 IMPRESSIONS     1. Left ventricular ejection fraction, by estimation, is 55 to 60%. Left  ventricular ejection fraction by 3D volume is 58 %. The left ventricle has  normal function. The left ventricle has no regional wall motion  abnormalities. The left ventricular  internal cavity size was mildly dilated. There is mild left ventricular  hypertrophy. Left ventricular diastolic parameters are indeterminate.   2.  Right ventricular systolic function is normal. The right ventricular  size is mildly enlarged. There is mildly elevated pulmonary artery  systolic pressure. The estimated right ventricular systolic pressure is  54.6 mmHg.   3. The mitral valve is normal in structure. Mild mitral valve  regurgitation.   4. The aortic valve is tricuspid. Aortic valve regurgitation is not  visualized. Mild aortic stenosis. Vmax 2.8 m/s, MG 15mHg, AVA 1.4 cm^2,  DI 0.55   5. Aortic dilatation noted. There is mild dilatation of the ascending  aorta measuring 36 mm.   6. The inferior vena cava is dilated in size with <50% respiratory  variability, suggesting right atrial pressure of 15 mmHg.   FINDINGS   Left Ventricle: Left ventricular ejection fraction, by estimation, is 55  to 60%. Left ventricular ejection fraction by 3D volume is 58 % The left  ventricle has normal function. The left ventricle has no regional wall  motion abnormalities. The left  ventricular internal cavity size was mildly dilated. There is mild left  ventricular hypertrophy. Left ventricular diastolic parameters are  indeterminate.   Right Ventricle: The right ventricular size is mildly enlarged. No  increase in right ventricular wall thickness. Right ventricular systolic  function is normal. There is mildly elevated pulmonary artery systolic  pressure. The tricuspid regurgitant  velocity is 2.25 m/s, and with an assumed right atrial pressure of 15  mmHg, the estimated right ventricular systolic pressure is 350.3mmHg.   Left  Atrium: Left atrial size was normal in size.   Right Atrium: Right atrial size was normal in size.   Pericardium: There is no evidence of pericardial effusion.   Mitral Valve: The mitral valve is normal in structure. Mild mitral valve  regurgitation.   Tricuspid Valve: The tricuspid valve is normal in structure. Tricuspid  valve regurgitation is trivial.   Aortic Valve: The aortic valve is tricuspid.  Aortic valve regurgitation is  not visualized. Mild aortic stenosis is present. Aortic valve mean  gradient measures 7.0 mmHg. Aortic valve peak gradient measures 13.8 mmHg.  Aortic valve area, by VTI measures  2.15 cm.   Pulmonic Valve: The pulmonic valve was not well visualized. Pulmonic valve  regurgitation is trivial.   Aorta: Aortic dilatation noted. There is mild dilatation of the ascending  aorta measuring 36 mm.   Venous: The inferior vena cava is dilated in size with less than 50%  respiratory variability, suggesting right atrial pressure of 15 mmHg.   IAS/Shunts: No atrial level shunt detected by color flow Doppler.      ASSESSMENT:    1. Coronary artery disease involving native coronary artery of native heart without angina pectoris   2. Persistent atrial fibrillation ([place])   3. Essential hypertension   4. Hyperlipidemia, unspecified hyperlipidemia type   5. Type 2 diabetes mellitus with complication, without long-term current use of insulin ([place])   6. PVD (peripheral vascular disease) ([place])   7. Aortic dilatation ([facility])   8. Bruit   9. Tobacco abuse   10. Fatigue, unspecified type      PLAN:  In order of problems listed above:  CAD status post NSTEMI treated with DES to the RCA.  Plans for triple therapy with Plavix/aspirin/Xarelto for 1 month then stop aspirin.  2D echo with normal LVEF 55 to 60%, no wall motion abnormality, normal LA size. no beta-blocker because of bradycardia. No angina.    New onset atrial fibrillation on Xarelto for iliac occlusion.  still in Afib so will schedule DCCV 06/03/19. Hasn't missed any meds. No beta blocker because of bradycardia   Essential hypertension-BP controlled   Hyperlipidemia Zocor switch to atorvastatin.  Plan fasting lipid panels in 2 months   Diabetes mellitus-managed by PCP   PVD with left iliac vein thrombosis followed by Dr. Oneida Alar on Xarelto   Ascending Aortic dilatation 36 mm-need to follow yearly.    Tobacco abuse-still smoking 15 cigarettes daily. Smoking cessation discussed. Will give nicotine patches and advised not to smoke with the patches on.   Carotid bruits check carotid Dopplers  Fatigue check TSH    Medication Adjustments/Labs and Tests Ordered: Current medicines are reviewed at length with the patient today.  Concerns regarding medicines are outlined above.  Medication changes, Labs and Tests ordered today are listed in the Patient Instructions below. Patient Instructions  Medication Instructions:  Your physician recommends that you continue on your current medications as directed. Please refer to the Current Medication list given to you today.   Labwork: CBC CMET TSH   Testing/Procedures: Your physician has recommended that you have a Cardioversion (DCCV). Electrical Cardioversion uses a jolt of electricity to your heart either through paddles or wired patches attached to your chest. This is a controlled, usually prescheduled, procedure. Defibrillation is done under light anesthesia in the hospital, and you usually go home the day of the procedure. This is done to get your heart back into a normal rhythm. You are not awake for the procedure. Please see  the instruction sheet given to you today.  Your physician has requested that you have a carotid duplex. This test is an ultrasound of the carotid arteries in your neck. It looks at blood flow through these arteries that supply the brain with blood. Allow one hour for this exam. There are no restrictions or special instructions.    Follow-Up: Your physician recommends that you schedule a follow-up appointment in: AFTER CARDIOVERSION     Any Other Special Instructions Will Be Listed Below (If Applicable).     If you need a refill on your cardiac medications before your next appointment, please call your pharmacy.      Sumner Boast, PA-C  05/24/2019 2:04 PM    [facility] [address], [place], [place]  [address] Phone: [phone]; Fax: [phone]

## 2019-05-21 ENCOUNTER — Ambulatory Visit (HOSPITAL_COMMUNITY)
Admission: RE | Admit: 2019-05-21 | Discharge: 2019-05-21 | Disposition: A | Discharge status: Home / Self Care | Payer: Medicaid Other | Source: Ambulatory Visit | Attending: Cardiology | Admitting: Cardiology

## 2019-05-21 ENCOUNTER — Ambulatory Visit (HOSPITAL_COMMUNITY): Payer: Medicaid Other

## 2019-05-21 ENCOUNTER — Other Ambulatory Visit: Payer: Self-pay

## 2019-05-21 VITALS — BP 106/92 | HR 63 | Temp 97.7°F | Ht 64.0 in | Wt 279.8 lb

## 2019-05-21 DIAGNOSIS — Z955 Presence of coronary angioplasty implant and graft: Secondary | ICD-10-CM | POA: Insufficient documentation

## 2019-05-21 DIAGNOSIS — I214 Non-ST elevation (NSTEMI) myocardial infarction: Secondary | ICD-10-CM | POA: Insufficient documentation

## 2019-05-21 NOTE — Progress Notes (Signed)
Daily Session Note  Patient Details  Name: Amy Tran MRN: [id number] Date of Birth: [birth year] Referring Provider:     CARDIAC REHAB PHASE II ORIENTATION from 05/21/2019 in [place]  Referring Provider  Branch      Encounter Date: 05/21/2019  Check In: Session Check In - 05/21/19 1137      Check-In   Supervising physician immediately available to respond to emergencies  See telemetry face sheet for immediately available MD    Location  [facility]    Staff Present  Russella Dar, MS, EP, [facility], Exercise Physiologist;Debra Wynetta Emery, RN, BSN;Other    Virtual Visit  No    Medication changes reported      No    Fall or balance concerns reported     No    Tobacco Cessation  Use Decreased    Current number of cigarettes/nicotine per day      15    Warm-up and Cool-down  Performed as group-led instruction    Resistance Training Performed  Yes    VAD Patient?  No    PAD/SET Patient?  No      Pain Assessment   Currently in Pain?  No/denies    Pain Score  0-No pain    Multiple Pain Sites  No       Capillary Blood Glucose: No results found for this or any previous visit (from the past 24 hour(s)).    Social History   Tobacco Use  Smoking Status Current Every Day Smoker  . Packs/day: 0.50  . Years: 29.00  . Pack years: 14.50  . Types: Cigarettes  . Start date: 04/09/1970  Smokeless Tobacco Never Used    Goals Met:  Independence with exercise equipment Exercise tolerated well Personal goals reviewed No report of cardiac concerns or symptoms Strength training completed today  Goals Unmet:  Not Applicable  Comments: Check out: 10:30   Dr. Kate Sable is Medical Director for [place] [facility] [place] and Pulmonary Rehab.

## 2019-05-21 NOTE — Progress Notes (Signed)
Cardiac Individual Treatment Plan  Patient Details  Name: Amy Tran MRN: [id number] Date of Birth: [birth year] Referring Provider:     CARDIAC REHAB PHASE II ORIENTATION from 05/21/2019 in [place]  Referring Provider  Branch      Initial Encounter Date:    CARDIAC REHAB PHASE II ORIENTATION from 05/21/2019 in [place]  Date  05/21/19      Visit Diagnosis: NSTEMI (non-ST elevated myocardial infarction) [facility])  Status post coronary artery stent placement  Patient's Home Medications on Admission:  Current Outpatient Medications:  .  ALBUTEROL IN, Inhale 2 puffs into the lungs every 6 (six) hours as needed (wheezing, shortness of breath). , Disp: , Rfl:  .  amLODipine (NORVASC) 5 MG tablet, Take 5 mg by mouth daily., Disp: , Rfl:  .  BD PEN NEEDLE NANO U/F 32G X 4 MM MISC, USE FOR VICTOZA INJECTIONS, Disp: , Rfl:  .  Canagliflozin (INVOKANA PO), Take by mouth daily. Pt unsure of dose, Disp: , Rfl:  .  cetirizine (ZYRTEC) 10 MG tablet, Take 10 mg by mouth daily. For itchy palms, Disp: , Rfl:  .  clopidogrel (PLAVIX) 75 MG tablet, Take 1 tablet (75 mg total) by mouth daily with breakfast., Disp: 30 tablet, Rfl: 0 .  Cyanocobalamin (VITAMIN B-12 PO), Take 500 mcg by mouth daily. , Disp: , Rfl:  .  diphenhydrAMINE (BENADRYL) 2 % cream, Apply 1 application topically 3 (three) times daily as needed for itching. For itchy palms, Disp: , Rfl:  .  fluticasone (FLONASE) 50 MCG/ACT nasal spray, Place 2 sprays into both nostrils as needed. , Disp: , Rfl:  .  losartan (COZAAR) 100 MG tablet, Take 100 mg by mouth daily., Disp: , Rfl:  .  nitroGLYCERIN (NITROSTAT) 0.4 MG SL tablet, Place 1 tablet (0.4 mg total) under the tongue every 5 (five) minutes as needed for chest pain., Disp: 30 tablet, Rfl: 0 .  potassium chloride SA (K-DUR,KLOR-CON) 20 MEQ tablet, Take 1 tablet (20 mEq total) by mouth daily. (Patient taking differently: Take 20 mEq by mouth  3 (three) times daily. ), Disp: , Rfl:  .  rivaroxaban (XARELTO) 20 MG TABS tablet, Take 1 tablet (20 mg total) by mouth daily with supper., Disp: 90 tablet, Rfl: 3 .  spironolactone (ALDACTONE) 25 MG tablet, Take 25 mg by mouth daily., Disp: , Rfl:  .  torsemide (DEMADEX) 20 MG tablet, TAKE 2 TABS IN AM AND 1 TAB IN PM (Patient taking differently: 60 mg. ), Disp: 90 tablet, Rfl: 3 .  VICTOZA 18 MG/3ML SOPN, Inject 1.8 mg into the skin daily. Taking 1.71m daily, Disp: , Rfl:  .  atorvastatin (LIPITOR) 80 MG tablet, Take 1 tablet (80 mg total) by mouth daily at 6 PM., Disp: 30 tablet, Rfl: 0 .  Nicotine 21-14-7 MG/24HR KIT, Place 1 kit onto the skin daily., Disp: 1 kit, Rfl: 0  Past Medical History: Past Medical History:  Diagnosis Date  . Arthritis   . Asthma   . Cancer ([facility]    multiple skin cancers  . Diabetes mellitus without complication ([place]   . Diverticulosis   . Fluid retention   . GERD (gastroesophageal reflux disease)   . Heart murmur   . High cholesterol   . Hypertension   . Incomplete rotator cuff tear   . OSA (obstructive sleep apnea)   . PONV (postoperative nausea and vomiting)   . Wears glasses   . Wears partial dentures  bottom    Tobacco Use: Social History   Tobacco Use  Smoking Status Current Every Day Smoker  . Packs/day: 0.50  . Years: 29.00  . Pack years: 14.50  . Types: Cigarettes  . Start date: 04/09/1970  Smokeless Tobacco Never Used    Labs: Recent Review Flowsheet Data    Labs for ITP Cardiac and Pulmonary Rehab Latest Ref Rng & Units 04/02/2017 06/08/2017 09/12/2017 04/26/2019 04/27/2019   Cholestrol 0 - 200 mg/dL - - - 174 -   LDLCALC 0 - 99 mg/dL - - - 111(H) -   HDL >40 mg/dL - - - 41 -   Trlycerides <150 mg/dL - - - 110 -   Hemoglobin A1c 4.8 - 5.6 % 9.0(H) - - - 8.5(H)   TCO2 22 - 32 mmol/L - 35(H) 32 - -      Capillary Blood Glucose: Lab Results  Component Value Date   GLUCAP 179 (H) 04/28/2019   GLUCAP 153 (H) 04/28/2019    GLUCAP 117 (H) 04/27/2019   GLUCAP 203 (H) 04/27/2019   GLUCAP 138 (H) 04/27/2019     Exercise Target Goals: Exercise Program Goal: Individual exercise prescription set using results from initial 6 min walk test and THRR while considering  patient's activity barriers and safety.   Exercise Prescription Goal: Starting with aerobic activity 30 plus minutes a day, 3 days per week for initial exercise prescription. Provide home exercise prescription and guidelines that participant acknowledges understanding prior to discharge.  Activity Barriers & Risk Stratification: Activity Barriers & Cardiac Risk Stratification - 05/21/19 1200      Activity Barriers & Cardiac Risk Stratification   Activity Barriers  Other (comment);Deconditioning    Comments  (L) leg swollen due to poor circulation/PVD    Cardiac Risk Stratification  High       6 Minute Walk: 6 Minute Walk    Row Name 05/21/19 1155         6 Minute Walk   Phase  Initial     Distance  600 feet     Walk Time  6 minutes     # of Rest Breaks  1     MPH  1.13     METS  1.87     RPE  10     Perceived Dyspnea   10     VO2 Peak  2.79     Symptoms  Yes (comment)     Comments  (L) leg became heavy during walk test due to PVD.     Resting HR  63 bpm     Resting BP  160/92     Resting Oxygen Saturation   100 %     Exercise Oxygen Saturation  during 6 min walk  100 %     Max Ex. HR  81 bpm     Max Ex. BP  150/82     2 Minute Post BP  142/82        Oxygen Initial Assessment:   Oxygen Re-Evaluation:   Oxygen Discharge (Final Oxygen Re-Evaluation):   Initial Exercise Prescription: Initial Exercise Prescription - 05/21/19 1200      Date of Initial Exercise RX and Referring Provider   Date  05/21/19    Referring Provider  Branch    Expected Discharge Date  08/20/19      Arm Ergometer   Level  1    Watts  10    RPM  40    Minutes  17  METs  1.6      T5 Nustep   Level  1    SPM  48    Minutes  22    METs   2.9      Prescription Details   Frequency (times per week)  3    Duration  Progress to 30 minutes of continuous aerobic without signs/symptoms of physical distress      Intensity   THRR 40-80% of Max Heartrate  100-119-137    Ratings of Perceived Exertion  11-13    Perceived Dyspnea  0-4      Progression   Progression  Continue to progress workloads to maintain intensity without signs/symptoms of physical distress.      Resistance Training   Training Prescription  Yes    Weight  1    Reps  10-15       Perform Capillary Blood Glucose checks as needed.  Exercise Prescription Changes:   Exercise Comments:  Exercise Comments    Row Name 05/21/19 1219           Exercise Comments  This is the patient's assessment and orientation visit. Patient did well with the her first day experience and is eager to get started.          Exercise Goals and Review:  Exercise Goals    Row Name 05/21/19 1216             Exercise Goals   Increase Physical Activity  Yes       Intervention  Provide advice, education, support and counseling about physical activity/exercise needs.;Develop an individualized exercise prescription for aerobic and resistive training based on initial evaluation findings, risk stratification, comorbidities and participant's personal goals.       Expected Outcomes  Short Term: Attend rehab on a regular basis to increase amount of physical activity.;Long Term: Add in home exercise to make exercise part of routine and to increase amount of physical activity.       Increase Strength and Stamina  Yes       Intervention  Provide advice, education, support and counseling about physical activity/exercise needs.;Develop an individualized exercise prescription for aerobic and resistive training based on initial evaluation findings, risk stratification, comorbidities and participant's personal goals.       Expected Outcomes  Short Term: Perform resistance training exercises  routinely during rehab and add in resistance training at home;Long Term: Improve cardiorespiratory fitness, muscular endurance and strength as measured by increased METs and functional capacity (6MWT)       Able to understand and use rate of perceived exertion (RPE) scale  Yes       Intervention  Provide education and explanation on how to use RPE scale       Expected Outcomes  Short Term: Able to use RPE daily in rehab to express subjective intensity level;Long Term:  Able to use RPE to guide intensity level when exercising independently       Able to understand and use Dyspnea scale  Yes       Intervention  Provide education and explanation on how to use Dyspnea scale       Expected Outcomes  Short Term: Able to use Dyspnea scale daily in rehab to express subjective sense of shortness of breath during exertion;Long Term: Able to use Dyspnea scale to guide intensity level when exercising independently       Knowledge and understanding of Target Heart Rate Range (THRR)  Yes  Intervention  Provide education and explanation of THRR including how the numbers were predicted and where they are located for reference       Expected Outcomes  Short Term: Able to use daily as guideline for intensity in rehab;Long Term: Able to use THRR to govern intensity when exercising independently       Able to check pulse independently  Yes       Intervention  Provide education and demonstration on how to check pulse in carotid and radial arteries.;Review the importance of being able to check your own pulse for safety during independent exercise       Expected Outcomes  Short Term: Able to explain why pulse checking is important during independent exercise;Long Term: Able to check pulse independently and accurately       Understanding of Exercise Prescription  Yes       Intervention  Provide education, explanation, and written materials on patient's individual exercise prescription       Expected Outcomes  Short  Term: Able to explain program exercise prescription;Long Term: Able to explain home exercise prescription to exercise independently          Exercise Goals Re-Evaluation :    Discharge Exercise Prescription (Final Exercise Prescription Changes):   Nutrition:  Target Goals: Understanding of nutrition guidelines, daily intake of sodium <1546m, cholesterol <206m calories 30% from fat and 7% or less from saturated fats, daily to have 5 or more servings of fruits and vegetables.  Biometrics: Pre Biometrics - 05/21/19 1220      Pre Biometrics   Height  5' 4"  (1.626 m)    Weight  279 lb 12.8 oz (126.9 kg)    Waist Circumference  46 inches    Hip Circumference  54 inches    Waist to Hip Ratio  0.85 %    BMI (Calculated)  48    Triceps Skinfold  23 mm    % Body Fat  52.6 %    Grip Strength  24.9 kg    Flexibility  0 in    Single Leg Stand  2.57 seconds        Nutrition Therapy Plan and Nutrition Goals: Nutrition Therapy & Goals - 05/21/19 1249      Personal Nutrition Goals   Personal Goal #2  Patient is eating heart healthy. She has made a lot of changes to her diet. handout given on how to make heart healthy choices.      Intervention Plan   Intervention  Nutrition handout(s) given to patient.       Nutrition Assessments: Nutrition Assessments - 05/21/19 1251      MEDFICTS Scores   Pre Score  42       Nutrition Goals Re-Evaluation:   Nutrition Goals Discharge (Final Nutrition Goals Re-Evaluation):   Psychosocial: Target Goals: Acknowledge presence or absence of significant depression and/or stress, maximize coping skills, provide positive support system. Participant is able to verbalize types and ability to use techniques and skills needed for reducing stress and depression.  Initial Review & Psychosocial Screening: Initial Psych Review & Screening - 05/21/19 1225      Initial Review   Current issues with  None Identified      Family Dynamics   Good  Support System?  Yes      Barriers   Psychosocial barriers to participate in program  There are no identifiable barriers or psychosocial needs.      Screening Interventions   Interventions  Encouraged to exercise  Quality of Life Scores: Quality of Life - 05/21/19 1300      Quality of Life   Select  Quality of Life      Quality of Life Scores   Health/Function Pre  20 %    Socioeconomic Pre  20 %    Psych/Spiritual Pre  20.14 %    Family Pre  20 %    GLOBAL Pre  20.03 %      Scores of 19 and below usually indicate a poorer quality of life in these areas.  A difference of  2-3 points is a clinically meaningful difference.  A difference of 2-3 points in the total score of the Quality of Life Index has been associated with significant improvement in overall quality of life, self-image, physical symptoms, and general health in studies assessing change in quality of life.  PHQ-9: Recent Review Flowsheet Data    Depression screen [facility] 2/9 05/21/2019 02/26/2019   Decreased Interest 0 0   Down, Depressed, Hopeless 0 0   PHQ - 2 Score 0 0   Altered sleeping 1 -   Tired, decreased energy 1 -   Change in appetite 0 -   Feeling bad or failure about yourself  0 -   Trouble concentrating 0 -   Moving slowly or fidgety/restless 0 -   Suicidal thoughts 0 -   PHQ-9 Score 2 -   Difficult doing work/chores Somewhat difficult -     Interpretation of Total Score  Total Score Depression Severity:  1-4 = Minimal depression, 5-9 = Mild depression, 10-14 = Moderate depression, 15-19 = Moderately severe depression, 20-27 = Severe depression   Psychosocial Evaluation and Intervention: Psychosocial Evaluation - 05/21/19 1226      Psychosocial Evaluation & Interventions   Interventions  Encouraged to exercise with the program and follow exercise prescription    Continue Psychosocial Services   No Follow up required       Psychosocial Re-Evaluation:   Psychosocial Discharge (Final  Psychosocial Re-Evaluation):   Vocational Rehabilitation: Provide vocational rehab assistance to qualifying candidates.   Vocational Rehab Evaluation & Intervention: Vocational Rehab - 05/21/19 1252      Initial Vocational Rehab Evaluation & Intervention   Assessment shows need for Vocational Rehabilitation  No       Education: Education Goals: Education classes will be provided on a weekly basis, covering required topics. Participant will state understanding/return demonstration of topics presented.  Learning Barriers/Preferences: Learning Barriers/Preferences - 05/21/19 1251      Learning Barriers/Preferences   Learning Barriers  None    Learning Preferences  Individual Instruction;Group Instruction;Skilled Demonstration       Education Topics: Hypertension, Hypertension Reduction -Define heart disease and high blood pressure. Discus how high blood pressure affects the body and ways to reduce high blood pressure.   Exercise and Your Heart -Discuss why it is important to exercise, the FITT principles of exercise, normal and abnormal responses to exercise, and how to exercise safely.   Angina -Discuss definition of angina, causes of angina, treatment of angina, and how to decrease risk of having angina.   Cardiac Medications -Review what the following cardiac medications are used for, how they affect the body, and side effects that may occur when taking the medications.  Medications include Aspirin, Beta blockers, calcium channel blockers, ACE Inhibitors, angiotensin receptor blockers, diuretics, digoxin, and antihyperlipidemics.   Congestive Heart Failure -Discuss the definition of CHF, how to live with CHF, the signs and symptoms of CHF, and how  keep track of weight and sodium intake.   Heart Disease and Intimacy -Discus the effect sexual activity has on the heart, how changes occur during intimacy as we age, and safety during sexual activity.   Smoking Cessation  / COPD -Discuss different methods to quit smoking, the health benefits of quitting smoking, and the definition of COPD.   Nutrition I: Fats -Discuss the types of cholesterol, what cholesterol does to the heart, and how cholesterol levels can be controlled.   Nutrition II: Labels -Discuss the different components of food labels and how to read food label   Heart Parts/Heart Disease and PAD -Discuss the anatomy of the heart, the pathway of blood circulation through the heart, and these are affected by heart disease.   Stress I: Signs and Symptoms -Discuss the causes of stress, how stress may lead to anxiety and depression, and ways to limit stress.   Stress II: Relaxation -Discuss different types of relaxation techniques to limit stress.   Warning Signs of Stroke / TIA -Discuss definition of a stroke, what the signs and symptoms are of a stroke, and how to identify when someone is having stroke.   Knowledge Questionnaire Score: Knowledge Questionnaire Score - 05/21/19 1252      Knowledge Questionnaire Score   Pre Score  23/28       Core Components/Risk Factors/Patient Goals at Admission: Personal Goals and Risk Factors at Admission - 05/21/19 1252      Core Components/Risk Factors/Patient Goals on Admission    Weight Management  Weight Maintenance    Personal Goal Other  Yes    Personal Goal  Keep heart in rhythm, not feeling tired anymore. Live longer.    Intervention  Attend program 3 x week and to supplement with exercise at using home exercise prescription.    Expected Outcomes  To reach expected gaols.       Core Components/Risk Factors/Patient Goals Review:    Core Components/Risk Factors/Patient Goals at Discharge (Final Review):    ITP Comments: ITP Comments    Row Name 05/21/19 1138           ITP Comments  Patient came for her assessment/orientation visit today. She is eager to get started.          Comments: Patient arrived for 1st  visit/orientation/education at 0800.  Patient was referred to CR by Dr. Harl Bowie due to NSTEMI (I21.4) and Stent Placement (Z95.5). During orientation advised patient on arrival and appointment times what to wear, what to do before, during and after exercise. Reviewed attendance and class policy. Talked about inclement weather and class consultation policy. Pt is scheduled to return Cardiac Rehab on 05/24/19 at 0930. Pt was advised to come to class 15 minutes before class starts. Patient was also given instructions on meeting with the dietician and attending the Family Structure classes. Discussed RPE/Dpysnea scales. Discussed initial THR and how to find their radial and/or carotid pulse. Discussed the initial exercise prescription and how this effects their progress. Pt is eager to get started. Patient participated in warm up stretches followed by light weights and resistance bands. Patient was able to complete 6 minute walk test. Patient c/o (L) lower extremity heaviness during walk test. Denied pain. Heaviness subsided at 2 minute rest. Heaviness was completely gone at the end of orientation. Patient was measured for the equipment. Discussed equipment safety with patient. Took patient pre-anthropometric measurements. Patient finished visit at 10:30.

## 2019-05-24 ENCOUNTER — Encounter: Payer: Self-pay | Admitting: Physician Assistant

## 2019-05-24 ENCOUNTER — Encounter (HOSPITAL_COMMUNITY)
Admission: RE | Admit: 2019-05-24 | Discharge: 2019-05-24 | Disposition: A | Discharge status: Home / Self Care | Payer: Medicaid Other | Source: Ambulatory Visit | Attending: Cardiology | Admitting: Cardiology

## 2019-05-24 ENCOUNTER — Other Ambulatory Visit: Payer: Self-pay

## 2019-05-24 ENCOUNTER — Ambulatory Visit (INDEPENDENT_AMBULATORY_CARE_PROVIDER_SITE_OTHER): Payer: Medicaid Other | Admitting: Physician Assistant

## 2019-05-24 VITALS — BP 140/74 | HR 68 | Ht 64.0 in | Wt 279.0 lb

## 2019-05-24 DIAGNOSIS — R0989 Other specified symptoms and signs involving the circulatory and respiratory systems: Secondary | ICD-10-CM

## 2019-05-24 DIAGNOSIS — Z955 Presence of coronary angioplasty implant and graft: Secondary | ICD-10-CM | POA: Diagnosis present

## 2019-05-24 DIAGNOSIS — I10 Essential (primary) hypertension: Secondary | ICD-10-CM | POA: Diagnosis not present

## 2019-05-24 DIAGNOSIS — I2510 Atherosclerotic heart disease of native coronary artery without angina pectoris: Secondary | ICD-10-CM | POA: Diagnosis not present

## 2019-05-24 DIAGNOSIS — I214 Non-ST elevation (NSTEMI) myocardial infarction: Secondary | ICD-10-CM | POA: Diagnosis present

## 2019-05-24 DIAGNOSIS — E785 Hyperlipidemia, unspecified: Secondary | ICD-10-CM | POA: Diagnosis not present

## 2019-05-24 DIAGNOSIS — R5383 Other fatigue: Secondary | ICD-10-CM

## 2019-05-24 DIAGNOSIS — Z720 Tobacco use: Secondary | ICD-10-CM

## 2019-05-24 DIAGNOSIS — I4819 Other persistent atrial fibrillation: Secondary | ICD-10-CM | POA: Diagnosis not present

## 2019-05-24 DIAGNOSIS — I739 Peripheral vascular disease, unspecified: Secondary | ICD-10-CM

## 2019-05-24 DIAGNOSIS — I77819 Aortic ectasia, unspecified site: Secondary | ICD-10-CM

## 2019-05-24 DIAGNOSIS — E118 Type 2 diabetes mellitus with unspecified complications: Secondary | ICD-10-CM

## 2019-05-24 MED ORDER — CLOPIDOGREL BISULFATE 75 MG PO TABS: 75.0000 mg | ORAL_TABLET | Freq: Every day | ORAL | 3 refills | Status: DC

## 2019-05-24 MED ORDER — NICOTINE 21 MG/24HR TD PT24: 21.0000 mg | MEDICATED_PATCH | Freq: Every day | TRANSDERMAL | 0 refills | Status: DC

## 2019-05-24 MED ORDER — NICOTINE 7 MG/24HR TD PT24: 7.0000 mg | MEDICATED_PATCH | Freq: Every day | TRANSDERMAL | 0 refills | Status: DC

## 2019-05-24 MED ORDER — NICOTINE 14 MG/24HR TD PT24: 14.0000 mg | MEDICATED_PATCH | Freq: Every day | TRANSDERMAL | 0 refills | Status: DC

## 2019-05-24 NOTE — Patient Instructions (Addendum)
Medication Instructions:  Your physician recommends that you continue on your current medications as directed. Please refer to the Current Medication list given to you today.   Labwork: CBC CMET TSH   Testing/Procedures: Your physician has recommended that you have a Cardioversion (DCCV). Electrical Cardioversion uses a jolt of electricity to your heart either through paddles or wired patches attached to your chest. This is a controlled, usually prescheduled, procedure. Defibrillation is done under light anesthesia in the hospital, and you usually go home the day of the procedure. This is done to get your heart back into a normal rhythm. You are not awake for the procedure. Please see the instruction sheet given to you today.  Your physician has requested that you have a carotid duplex. This test is an ultrasound of the carotid arteries in your neck. It looks at blood flow through these arteries that supply the brain with blood. Allow one hour for this exam. There are no restrictions or special instructions.    Follow-Up: Your physician recommends that you schedule a follow-up appointment in: AFTER CARDIOVERSION     Any Other Special Instructions Will Be Listed Below (If Applicable).     If you need a refill on your cardiac medications before your next appointment, please call your pharmacy.

## 2019-05-24 NOTE — Progress Notes (Signed)
Daily Session Note  Patient Details  Name: Amy Tran MRN: [id number] Date of Birth: November 27, 1955 Referring Provider:     CARDIAC REHAB PHASE II ORIENTATION from 05/21/2019 in [facility]  Referring Provider  Branch      Encounter Date: 05/24/2019  Check In: Session Check In - 05/24/19 1054      Check-In   Supervising physician immediately available to respond to emergencies  See telemetry face sheet for immediately available MD    Location  [facility]    Staff Present  Russella Dar, MS, EP, [facility], Exercise Physiologist;Lorie Cleckley Wynetta Emery, RN, BSN;Other    Virtual Visit  No    Medication changes reported      No    Fall or balance concerns reported     No    Tobacco Cessation  No Change    Warm-up and Cool-down  Performed as group-led instruction    Resistance Training Performed  Yes    VAD Patient?  No    PAD/SET Patient?  No      Pain Assessment   Currently in Pain?  No/denies    Pain Score  0-No pain    Multiple Pain Sites  No       Capillary Blood Glucose: No results found for this or any previous visit (from the past 24 hour(s)).    Social History   Tobacco Use  Smoking Status Current Every Day Smoker  . Packs/day: 0.50  . Years: 29.00  . Pack years: 14.50  . Types: Cigarettes  . Start date: 04/09/1970  Smokeless Tobacco Never Used    Goals Met:  Independence with exercise equipment Exercise tolerated well No report of cardiac concerns or symptoms Strength training completed today  Goals Unmet:  Not Applicable  Comments: Check out 1030.   Dr. Kate Sable is Medical Director for [facility] Cardiac and Pulmonary Rehab.

## 2019-05-26 ENCOUNTER — Encounter (HOSPITAL_COMMUNITY)
Admission: RE | Admit: 2019-05-26 | Discharge: 2019-05-26 | Disposition: A | Discharge status: Home / Self Care | Payer: Medicaid Other | Source: Ambulatory Visit | Attending: Cardiology | Admitting: Cardiology

## 2019-05-26 ENCOUNTER — Other Ambulatory Visit: Payer: Self-pay

## 2019-05-26 ENCOUNTER — Telehealth: Payer: Self-pay | Admitting: Cardiology

## 2019-05-26 MED ORDER — ATORVASTATIN CALCIUM 80 MG PO TABS: 80.0000 mg | ORAL_TABLET | Freq: Every day | ORAL | 1 refills | Status: DC

## 2019-05-26 NOTE — Telephone Encounter (Signed)
Refilled

## 2019-05-26 NOTE — Telephone Encounter (Signed)
Needing refill on atorvastatin (LIPITOR) 80 MG tablet [id number]  Sent to [facility] in [place]-- was put on this in the hospital and pt doesn't have any refills

## 2019-05-28 ENCOUNTER — Encounter (HOSPITAL_COMMUNITY): Payer: Medicaid Other

## 2019-05-31 ENCOUNTER — Encounter (HOSPITAL_COMMUNITY): Payer: Medicaid Other

## 2019-05-31 NOTE — Patient Instructions (Signed)
Your procedure is scheduled on: 06/03/2019  Report to [facility] at 8:00    AM.  Call this number if you have problems the morning of surgery: [phone]   Remember:   Do not Eat or Drink after midnight         No Smoking the morning of surgery  :  Take these medicines the morning of surgery with A SIP OF WATER: Amlodipine, Losatan, Spironolactone, zyrtec, and flonase and inhalers if needed  No diabetic medication morning of surgery   Do not wear jewelry, make-up or nail polish.  Do not wear lotions, powders, or perfumes. You may wear deodorant.  Do not shave 48 hours prior to surgery. Men may shave face and neck.  Do not bring valuables to the hospital.  Contacts, dentures or bridgework may not be worn into surgery.  Leave suitcase in the car. After surgery it may be brought to your room.  For patients admitted to the hospital, checkout time is 11:00 AM the day of discharge.   Patients discharged the day of surgery will not be allowed to drive home.     Electrical Cardioversion Electrical cardioversion is the delivery of a jolt of electricity to restore a normal rhythm to the heart. A rhythm that is too fast or is not regular keeps the heart from pumping well. In this procedure, sticky patches or metal paddles are placed on the chest to deliver electricity to the heart from a device. This procedure may be done in an emergency if:  There is low or no blood pressure as a result of the heart rhythm.  Normal rhythm must be restored as fast as possible to protect the brain and heart from further damage.  It may save a life. This may also be a scheduled procedure for irregular or fast heart rhythms that are not immediately life-threatening. Tell a health care provider about:  Any allergies you have.  All medicines you are taking, including vitamins, herbs, eye drops, creams, and over-the-counter medicines.  Any problems you or family members have had with anesthetic  medicines.  Any blood disorders you have.  Any surgeries you have had.  Any medical conditions you have.  Whether you are pregnant or may be pregnant. What are the risks? Generally, this is a safe procedure. However, problems may occur, including:  Allergic reactions to medicines.  A blood clot that breaks free and travels to other parts of your body.  The possible return of an abnormal heart rhythm within hours or days after the procedure.  Your heart stopping (cardiac arrest). This is rare. What happens before the procedure? Medicines  Your health care provider may have you start taking: ? Blood-thinning medicines (anticoagulants) so your blood does not clot as easily. ? Medicines to help stabilize your heart rate and rhythm.  Ask your health care provider about: ? Changing or stopping your regular medicines. This is especially important if you are taking diabetes medicines or blood thinners. ? Taking medicines such as aspirin and ibuprofen. These medicines can thin your blood. Do not take these medicines unless your health care provider tells you to take them. ? Taking over-the-counter medicines, vitamins, herbs, and supplements. General instructions  Follow instructions from your health care provider about eating or drinking restrictions.  Plan to have someone take you home from the hospital or clinic.  If you will be going home right after the procedure, plan to have someone with you for 24 hours.  Ask your health  care provider what steps will be taken to help prevent infection. These may include washing your skin with a germ-killing soap. What happens during the procedure?   An IV will be inserted into one of your veins.  Sticky patches (electrodes) or metal paddles may be placed on your chest.  You will be given a medicine to help you relax (sedative).  An electrical shock will be delivered. The procedure may vary among health care providers and  hospitals. What can I expect after the procedure?  Your blood pressure, heart rate, breathing rate, and blood oxygen level will be monitored until you leave the hospital or clinic.  Your heart rhythm will be watched to make sure it does not change.  You may have some redness on the skin where the shocks were given. Follow these instructions at home:  Do not drive for 24 hours if you were given a sedative during your procedure.  Take over-the-counter and prescription medicines only as told by your health care provider.  Ask your health care provider how to check your pulse. Check it often.  Rest for 48 hours after the procedure or as told by your health care provider.  Avoid or limit your caffeine use as told by your health care provider.  Keep all follow-up visits as told by your health care provider. This is important. Contact a health care provider if:  You feel like your heart is beating too quickly or your pulse is not regular.  You have a serious muscle cramp that does not go away. Get help right away if:  You have discomfort in your chest.  You are dizzy or you feel faint.  You have trouble breathing or you are short of breath.  Your speech is slurred.  You have trouble moving an arm or leg on one side of your body.  Your fingers or toes turn cold or blue. Summary  Electrical cardioversion is the delivery of a jolt of electricity to restore a normal rhythm to the heart.  This procedure may be done right away in an emergency or may be a scheduled procedure if the condition is not an emergency.  Generally, this is a safe procedure.  After the procedure, check your pulse often as told by your health care provider. This information is not intended to replace advice given to you by your health care provider. Make sure you discuss any questions you have with your health care provider. Document Revised: 08/31/2018 Document Reviewed: 08/31/2018 Elsevier Patient  Education  [place] Anesthesia, Adult, Care After This sheet gives you information about how to care for yourself after your procedure. Your health care provider may also give you more specific instructions. If you have problems or questions, contact your health care provider. What can I expect after the procedure? After the procedure, the following side effects are common:  Pain or discomfort at the IV site.  Nausea.  Vomiting.  Sore throat.  Trouble concentrating.  Feeling cold or chills.  Weak or tired.  Sleepiness and fatigue.  Soreness and body aches. These side effects can affect parts of the body that were not involved in surgery. Follow these instructions at home:  For at least 24 hours after the procedure:  Have a responsible adult stay with you. It is important to have someone help care for you until you are awake and alert.  Rest as needed.  Do not: ? Participate in activities in which you could fall or become  injured. ? Drive. ? Use heavy machinery. ? Drink alcohol. ? Take sleeping pills or medicines that cause drowsiness. ? Make important decisions or sign legal documents. ? Take care of children on your own. Eating and drinking  Follow any instructions from your health care provider about eating or drinking restrictions.  When you feel hungry, start by eating small amounts of foods that are soft and easy to digest (bland), such as toast. Gradually return to your regular diet.  Drink enough fluid to keep your urine pale yellow.  If you vomit, rehydrate by drinking water, juice, or clear broth. General instructions  If you have sleep apnea, surgery and certain medicines can increase your risk for breathing problems. Follow instructions from your health care provider about wearing your sleep device: ? Anytime you are sleeping, including during daytime naps. ? While taking prescription pain medicines, sleeping medicines, or medicines  that make you drowsy.  Return to your normal activities as told by your health care provider. Ask your health care provider what activities are safe for you.  Take over-the-counter and prescription medicines only as told by your health care provider.  If you smoke, do not smoke without supervision.  Keep all follow-up visits as told by your health care provider. This is important. Contact a health care provider if:  You have nausea or vomiting that does not get better with medicine.  You cannot eat or drink without vomiting.  You have pain that does not get better with medicine.  You are unable to pass urine.  You develop a skin rash.  You have a fever.  You have redness around your IV site that gets worse. Get help right away if:  You have difficulty breathing.  You have chest pain.  You have blood in your urine or stool, or you vomit blood. Summary  After the procedure, it is common to have a sore throat or nausea. It is also common to feel tired.  Have a responsible adult stay with you for the first 24 hours after general anesthesia. It is important to have someone help care for you until you are awake and alert.  When you feel hungry, start by eating small amounts of foods that are soft and easy to digest (bland), such as toast. Gradually return to your regular diet.  Drink enough fluid to keep your urine pale yellow.  Return to your normal activities as told by your health care provider. Ask your health care provider what activities are safe for you. This information is not intended to replace advice given to you by your health care provider. Make sure you discuss any questions you have with your health care provider. Document Revised: 01/31/2017 Document Reviewed: 09/13/2016 Elsevier Patient Education  [place].

## 2019-06-01 ENCOUNTER — Other Ambulatory Visit: Payer: Self-pay

## 2019-06-01 ENCOUNTER — Ambulatory Visit (HOSPITAL_COMMUNITY)
Admission: RE | Admit: 2019-06-01 | Discharge: 2019-06-01 | Disposition: A | Discharge status: Home / Self Care | Payer: Medicaid Other | Source: Ambulatory Visit | Attending: Physician Assistant | Admitting: Physician Assistant

## 2019-06-01 ENCOUNTER — Encounter (HOSPITAL_COMMUNITY)
Admission: RE | Admit: 2019-06-01 | Discharge: 2019-06-01 | Disposition: A | Discharge status: Home / Self Care | Payer: Medicaid Other | Source: Ambulatory Visit | Attending: Cardiovascular Disease | Admitting: Cardiovascular Disease

## 2019-06-01 ENCOUNTER — Other Ambulatory Visit (HOSPITAL_COMMUNITY)
Admission: RE | Admit: 2019-06-01 | Discharge: 2019-06-01 | Disposition: A | Discharge status: Home / Self Care | Payer: Medicaid Other | Source: Ambulatory Visit | Attending: Cardiovascular Disease | Admitting: Cardiovascular Disease

## 2019-06-01 ENCOUNTER — Encounter (HOSPITAL_COMMUNITY): Payer: Self-pay

## 2019-06-01 DIAGNOSIS — Z20822 Contact with and (suspected) exposure to covid-19: Secondary | ICD-10-CM | POA: Insufficient documentation

## 2019-06-01 DIAGNOSIS — Z01812 Encounter for preprocedural laboratory examination: Secondary | ICD-10-CM | POA: Insufficient documentation

## 2019-06-01 DIAGNOSIS — R0989 Other specified symptoms and signs involving the circulatory and respiratory systems: Secondary | ICD-10-CM | POA: Insufficient documentation

## 2019-06-01 HISTORY — DX: Cardiac arrhythmia, unspecified: I49.9

## 2019-06-01 HISTORY — DX: Acute myocardial infarction, unspecified: I21.9

## 2019-06-01 LAB — DIFFERENTIAL
Abs Immature Granulocytes: 0.05 10*3/uL (ref 0.00–0.07)
Basophils Absolute: 0 10*3/uL (ref 0.0–0.1)
Basophils Relative: 0 %
Eosinophils Absolute: 0.2 10*3/uL (ref 0.0–0.5)
Eosinophils Relative: 2 %
Immature Granulocytes: 0 %
Lymphocytes Relative: 24 %
Lymphs Abs: 2.7 10*3/uL (ref 0.7–4.0)
Monocytes Absolute: 1.3 10*3/uL — ABNORMAL HIGH (ref 0.1–1.0)
Monocytes Relative: 11 %
Neutro Abs: 7 10*3/uL (ref 1.7–7.7)
Neutrophils Relative %: 63 %

## 2019-06-01 LAB — COMPREHENSIVE METABOLIC PANEL
ALT: 13 U/L (ref 0–44)
AST: 18 U/L (ref 15–41)
Albumin: 3.8 g/dL (ref 3.5–5.0)
Alkaline Phosphatase: 68 U/L (ref 38–126)
Anion gap: 9 (ref 5–15)
BUN: 16 mg/dL (ref 8–23)
CO2: 27 mmol/L (ref 22–32)
Calcium: 9.5 mg/dL (ref 8.9–10.3)
Chloride: 101 mmol/L (ref 98–111)
Creatinine, Ser: 0.74 mg/dL (ref 0.44–1.00)
GFR calc Af Amer: >60 mL/min (ref >60)
GFR calc non Af Amer: >60 mL/min (ref >60)
Glucose, Bld: 81 mg/dL (ref 70–99)
Potassium: 3.9 mmol/L (ref 3.5–5.1)
Sodium: 137 mmol/L (ref 135–145)
Total Bilirubin: 1 mg/dL (ref 0.3–1.2)
Total Protein: 8 g/dL (ref 6.5–8.1)

## 2019-06-01 LAB — CBC
HCT: 40 % (ref 36.0–46.0)
Hemoglobin: 12.7 g/dL (ref 12.0–15.0)
MCH: 27.1 pg (ref 26.0–34.0)
MCHC: 31.8 g/dL (ref 30.0–36.0)
MCV: 85.3 fL (ref 80.0–100.0)
Platelets: 381 10*3/uL (ref 150–400)
RBC: 4.69 MIL/uL (ref 3.87–5.11)
RDW: 15.7 % — ABNORMAL HIGH (ref 11.5–15.5)
WBC: 11.3 10*3/uL — ABNORMAL HIGH (ref 4.0–10.5)
nRBC: 0 % (ref 0.0–0.2)

## 2019-06-01 LAB — PROTIME-INR
INR: 1.5 — ABNORMAL HIGH (ref 0.8–1.2)
Prothrombin Time: 17.6 seconds — ABNORMAL HIGH (ref 11.4–15.2)

## 2019-06-01 LAB — TSH: TSH: 0.567 u[IU]/mL (ref 0.350–4.500)

## 2019-06-02 ENCOUNTER — Encounter (HOSPITAL_COMMUNITY): Payer: Medicaid Other

## 2019-06-02 LAB — SARS CORONAVIRUS 2 (TAT 6-24 HRS): SARS Coronavirus 2: NEGATIVE

## 2019-06-02 NOTE — Progress Notes (Signed)
Cardiac Individual Treatment Plan  Patient Details  Name: Amy Tran MRN: [id number] Date of Birth: [birth year] Referring Provider:     CARDIAC REHAB [place] from 05/21/2019 in [place]  Referring Provider  Branch      Initial Encounter Date:    CARDIAC REHAB PHASE II ORIENTATION from 05/21/2019 in [place]  Date  05/21/19      Visit Diagnosis: NSTEMI (non-ST elevated myocardial infarction) [facility])  Status post coronary artery stent placement  Patient's Home Medications on Admission:  Current Outpatient Medications:  .  acetaminophen (TYLENOL) 500 MG tablet, Take 1,000 mg by mouth 2 (two) times daily as needed for moderate pain., Disp: , Rfl:  .  albuterol (VENTOLIN HFA) 108 (90 Base) MCG/ACT inhaler, Inhale 2 puffs into the lungs every 6 (six) hours as needed for wheezing or shortness of breath., Disp: , Rfl:  .  amLODipine (NORVASC) 5 MG tablet, Take 5 mg by mouth daily., Disp: , Rfl:  .  aspirin EC 81 MG tablet, Take 81 mg by mouth daily., Disp: , Rfl:  .  atorvastatin (LIPITOR) 80 MG tablet, Take 1 tablet (80 mg total) by mouth daily at 6 PM. (Patient taking differently: Take 80 mg by mouth at bedtime. ), Disp: 90 tablet, Rfl: 1 .  BD PEN NEEDLE NANO U/F 32G X 4 MM MISC, USE FOR VICTOZA INJECTIONS, Disp: , Rfl:  .  canagliflozin (INVOKANA) 300 MG TABS tablet, Take 300 mg by mouth daily before breakfast., Disp: , Rfl:  .  cetirizine (ZYRTEC) 10 MG tablet, Take 10 mg by mouth daily. For itchy palms, Disp: , Rfl:  .  clopidogrel (PLAVIX) 75 MG tablet, Take 1 tablet (75 mg total) by mouth daily with breakfast., Disp: 90 tablet, Rfl: 3 .  Cyanocobalamin (B-12) 5000 MCG CAPS, Take 5,000 mcg by mouth daily., Disp: , Rfl:  .  diphenhydrAMINE (BENADRYL) 2 % cream, Apply 1 application topically 3 (three) times daily as needed for itching. For itchy palms, Disp: , Rfl:  .  fluticasone (FLONASE) 50 MCG/ACT nasal spray, Place 2  sprays into both nostrils as needed for allergies. , Disp: , Rfl:  .  losartan (COZAAR) 100 MG tablet, Take 100 mg by mouth daily., Disp: , Rfl:  .  nicotine (NICODERM CQ - DOSED IN MG/24 HOURS) 14 mg/24hr patch, Place 1 patch (14 mg total) onto the skin daily., Disp: 14 patch, Rfl: 0 .  nicotine (NICODERM CQ - DOSED IN MG/24 HOURS) 21 mg/24hr patch, Place 1 patch (21 mg total) onto the skin daily., Disp: 42 patch, Rfl: 0 .  nicotine (NICODERM CQ - DOSED IN MG/24 HR) 7 mg/24hr patch, Place 1 patch (7 mg total) onto the skin daily., Disp: 14 patch, Rfl: 0 .  nitroGLYCERIN (NITROSTAT) 0.4 MG SL tablet, Place 1 tablet (0.4 mg total) under the tongue every 5 (five) minutes as needed for chest pain., Disp: 30 tablet, Rfl: 0 .  potassium chloride SA (K-DUR,KLOR-CON) 20 MEQ tablet, Take 1 tablet (20 mEq total) by mouth daily. (Patient taking differently: Take 20 mEq by mouth 3 (three) times daily. ), Disp: , Rfl:  .  rivaroxaban (XARELTO) 20 MG TABS tablet, Take 1 tablet (20 mg total) by mouth daily with supper., Disp: 90 tablet, Rfl: 3 .  spironolactone (ALDACTONE) 25 MG tablet, Take 25 mg by mouth daily., Disp: , Rfl:  .  torsemide (DEMADEX) 20 MG tablet, TAKE 2 TABS IN AM AND 1 TAB IN PM (  Patient taking differently: Take 60 mg by mouth daily. ), Disp: 90 tablet, Rfl: 3 .  VICTOZA 18 MG/3ML SOPN, Inject 1.8 mg into the skin at bedtime. , Disp: , Rfl:   Past Medical History: Past Medical History:  Diagnosis Date  . Arthritis   . Asthma   . Cancer [facility])    multiple skin cancers  . Diabetes mellitus without complication ([place])   . Diverticulosis   . Dysrhythmia   . Fluid retention   . GERD (gastroesophageal reflux disease)   . Heart murmur   . High cholesterol   . Hypertension   . Incomplete rotator cuff tear   . Myocardial infarction ([place])   . PONV (postoperative nausea and vomiting)   . Wears glasses   . Wears partial dentures    bottom    Tobacco Use: Social History   Tobacco Use   Smoking Status Current Every Day Smoker  . Packs/day: 0.50  . Years: 29.00  . Pack years: 14.50  . Types: Cigarettes  . Start date: 04/09/1970  Smokeless Tobacco Never Used    Labs: Recent Review Flowsheet Data    Labs for ITP Cardiac and Pulmonary Rehab Latest Ref Rng & Units 04/02/2017 06/08/2017 09/12/2017 04/26/2019 04/27/2019   Cholestrol 0 - 200 mg/dL - - - 174 -   LDLCALC 0 - 99 mg/dL - - - 111(H) -   HDL >40 mg/dL - - - 41 -   Trlycerides <150 mg/dL - - - 110 -   Hemoglobin A1c 4.8 - 5.6 % 9.0(H) - - - 8.5(H)   TCO2 22 - 32 mmol/L - 35(H) 32 - -      Capillary Blood Glucose: Lab Results  Component Value Date   GLUCAP 179 (H) 04/28/2019   GLUCAP 153 (H) 04/28/2019   GLUCAP 117 (H) 04/27/2019   GLUCAP 203 (H) 04/27/2019   GLUCAP 138 (H) 04/27/2019     Exercise Target Goals: Exercise Program Goal: Individual exercise prescription set using results from initial 6 min walk test and THRR while considering  patient's activity barriers and safety.   Exercise Prescription Goal: Starting with aerobic activity 30 plus minutes a day, 3 days per week for initial exercise prescription. Provide home exercise prescription and guidelines that participant acknowledges understanding prior to discharge.  Activity Barriers & Risk Stratification: Activity Barriers & Cardiac Risk Stratification - 05/21/19 1200      Activity Barriers & Cardiac Risk Stratification   Activity Barriers  Other (comment);Deconditioning    Comments  (L) leg swollen due to poor circulation/PVD    Cardiac Risk Stratification  High       6 Minute Walk: 6 Minute Walk    Row Name 05/21/19 1155         6 Minute Walk   Phase  Initial     Distance  600 feet     Walk Time  6 minutes     # of Rest Breaks  1     MPH  1.13     METS  1.87     RPE  10     Perceived Dyspnea   10     VO2 Peak  2.79     Symptoms  Yes (comment)     Comments  (L) leg became heavy during walk test due to PVD.     Resting HR  63  bpm     Resting BP  160/92     Resting Oxygen Saturation   100 %  Exercise Oxygen Saturation  during 6 min walk  100 %     Max Ex. HR  81 bpm     Max Ex. BP  150/82     2 Minute Post BP  142/82        Oxygen Initial Assessment:   Oxygen Re-Evaluation:   Oxygen Discharge (Final Oxygen Re-Evaluation):   Initial Exercise Prescription: Initial Exercise Prescription - 05/21/19 1200      Date of Initial Exercise RX and Referring Provider   Date  05/21/19    Referring Provider  Branch    Expected Discharge Date  08/20/19      Arm Ergometer   Level  1    Watts  10    RPM  40    Minutes  17    METs  1.6      T5 Nustep   Level  1    SPM  48    Minutes  22    METs  2.9      Prescription Details   Frequency (times per week)  3    Duration  Progress to 30 minutes of continuous aerobic without signs/symptoms of physical distress      Intensity   THRR 40-80% of Max Heartrate  100-119-137    Ratings of Perceived Exertion  11-13    Perceived Dyspnea  0-4      Progression   Progression  Continue to progress workloads to maintain intensity without signs/symptoms of physical distress.      Resistance Training   Training Prescription  Yes    Weight  1    Reps  10-15       Perform Capillary Blood Glucose checks as needed.  Exercise Prescription Changes:   Exercise Comments:  Exercise Comments    Row Name 05/21/19 1219 05/31/19 1624         Exercise Comments  This is the patient's assessment and orientation visit. Patient did well with the her first day experience and is eager to get started.  Patient has only been to 2 visits. She is having a cardioversion done. Her heart doctor has asked her not to return until that procedure has been done and they have released her to resume exercise. Once she returns we will progress her as tolerated.         Exercise Goals and Review:  Exercise Goals    Row Name 05/21/19 1216             Exercise Goals   Increase  Physical Activity  Yes       Intervention  Provide advice, education, support and counseling about physical activity/exercise needs.;Develop an individualized exercise prescription for aerobic and resistive training based on initial evaluation findings, risk stratification, comorbidities and participant's personal goals.       Expected Outcomes  Short Term: Attend rehab on a regular basis to increase amount of physical activity.;Long Term: Add in home exercise to make exercise part of routine and to increase amount of physical activity.       Increase Strength and Stamina  Yes       Intervention  Provide advice, education, support and counseling about physical activity/exercise needs.;Develop an individualized exercise prescription for aerobic and resistive training based on initial evaluation findings, risk stratification, comorbidities and participant's personal goals.       Expected Outcomes  Short Term: Perform resistance training exercises routinely during rehab and add in resistance training at home;Long Term: Improve cardiorespiratory fitness, muscular endurance  and strength as measured by increased METs and functional capacity (6MWT)       Able to understand and use rate of perceived exertion (RPE) scale  Yes       Intervention  Provide education and explanation on how to use RPE scale       Expected Outcomes  Short Term: Able to use RPE daily in rehab to express subjective intensity level;Long Term:  Able to use RPE to guide intensity level when exercising independently       Able to understand and use Dyspnea scale  Yes       Intervention  Provide education and explanation on how to use Dyspnea scale       Expected Outcomes  Short Term: Able to use Dyspnea scale daily in rehab to express subjective sense of shortness of breath during exertion;Long Term: Able to use Dyspnea scale to guide intensity level when exercising independently       Knowledge and understanding of Target Heart Rate Range  (THRR)  Yes       Intervention  Provide education and explanation of THRR including how the numbers were predicted and where they are located for reference       Expected Outcomes  Short Term: Able to use daily as guideline for intensity in rehab;Long Term: Able to use THRR to govern intensity when exercising independently       Able to check pulse independently  Yes       Intervention  Provide education and demonstration on how to check pulse in carotid and radial arteries.;Review the importance of being able to check your own pulse for safety during independent exercise       Expected Outcomes  Short Term: Able to explain why pulse checking is important during independent exercise;Long Term: Able to check pulse independently and accurately       Understanding of Exercise Prescription  Yes       Intervention  Provide education, explanation, and written materials on patient's individual exercise prescription       Expected Outcomes  Short Term: Able to explain program exercise prescription;Long Term: Able to explain home exercise prescription to exercise independently          Exercise Goals Re-Evaluation :    Discharge Exercise Prescription (Final Exercise Prescription Changes):   Nutrition:  Target Goals: Understanding of nutrition guidelines, daily intake of sodium 1500mg , cholesterol 200mg , calories 30% from fat and 7% or less from saturated fats, daily to have 5 or more servings of fruits and vegetables.  Biometrics: Pre Biometrics - 05/21/19 1220      Pre Biometrics   Height  5\' 4"  (1.626 m)    Weight  126.9 kg    Waist Circumference  46 inches    Hip Circumference  54 inches    Waist to Hip Ratio  0.85 %    BMI (Calculated)  48    Triceps Skinfold  23 mm    % Body Fat  52.6 %    Grip Strength  24.9 kg    Flexibility  0 in    Single Leg Stand  2.57 seconds        Nutrition Therapy Plan and Nutrition Goals: Nutrition Therapy & Goals - 06/02/19 1454      Personal  Nutrition Goals   Comments  We continue to work with our RD to schedule RD classes. In the intermin, we are providing education through hand-outs. Will continue to monitor.  Intervention Plan   Intervention  Nutrition handout(s) given to patient.       Nutrition Assessments: Nutrition Assessments - 05/21/19 1251      MEDFICTS Scores   Pre Score  42       Nutrition Goals Re-Evaluation:   Nutrition Goals Discharge (Final Nutrition Goals Re-Evaluation):   Psychosocial: Target Goals: Acknowledge presence or absence of significant depression and/or stress, maximize coping skills, provide positive support system. Participant is able to verbalize types and ability to use techniques and skills needed for reducing stress and depression.  Initial Review & Psychosocial Screening: Initial Psych Review & Screening - 05/21/19 1225      Initial Review   Current issues with  None Identified      Family Dynamics   Good Support System?  Yes      Barriers   Psychosocial barriers to participate in program  There are no identifiable barriers or psychosocial needs.      Screening Interventions   Interventions  Encouraged to exercise       Quality of Life Scores: Quality of Life - 05/21/19 1300      Quality of Life   Select  Quality of Life      Quality of Life Scores   Health/Function Pre  20 %    Socioeconomic Pre  20 %    Psych/Spiritual Pre  20.14 %    Family Pre  20 %    GLOBAL Pre  20.03 %      Scores of 19 and below usually indicate a poorer quality of life in these areas.  A difference of  2-3 points is a clinically meaningful difference.  A difference of 2-3 points in the total score of the Quality of Life Index has been associated with significant improvement in overall quality of life, self-image, physical symptoms, and general health in studies assessing change in quality of life.  PHQ-9: Recent Review Flowsheet Data    Depression screen [facility] 2/9 05/21/2019 02/26/2019    Decreased Interest 0 0   Down, Depressed, Hopeless 0 0   PHQ - 2 Score 0 0   Altered sleeping 1 -   Tired, decreased energy 1 -   Change in appetite 0 -   Feeling bad or failure about yourself  0 -   Trouble concentrating 0 -   Moving slowly or fidgety/restless 0 -   Suicidal thoughts 0 -   PHQ-9 Score 2 -   Difficult doing work/chores Somewhat difficult -     Interpretation of Total Score  Total Score Depression Severity:  1-4 = Minimal depression, 5-9 = Mild depression, 10-14 = Moderate depression, 15-19 = Moderately severe depression, 20-27 = Severe depression   Psychosocial Evaluation and Intervention: Psychosocial Evaluation - 05/21/19 1226      Psychosocial Evaluation & Interventions   Interventions  Encouraged to exercise with the program and follow exercise prescription    Continue Psychosocial Services   No Follow up required       Psychosocial Re-Evaluation: Psychosocial Re-Evaluation    [place] Name 06/02/19 1458             Psychosocial Re-Evaluation   Current issues with  None Identified       Comments  Patient's initial QOL score was 20.03 and her PHQ-9 score was 2 with no psychosocial issues identified. Will continue to monitor.       Expected Outcomes  Patient will have no psychosocial issued identified at discharge.  Interventions  Stress management education;Encouraged to attend Cardiac Rehabilitation for the exercise;Relaxation education       Continue Psychosocial Services   No Follow up required          Psychosocial Discharge (Final Psychosocial Re-Evaluation): Psychosocial Re-Evaluation - 06/02/19 1458      Psychosocial Re-Evaluation   Current issues with  None Identified    Comments  Patient's initial QOL score was 20.03 and her PHQ-9 score was 2 with no psychosocial issues identified. Will continue to monitor.    Expected Outcomes  Patient will have no psychosocial issued identified at discharge.    Interventions  Stress management  education;Encouraged to attend Cardiac Rehabilitation for the exercise;Relaxation education    Continue Psychosocial Services   No Follow up required       Vocational Rehabilitation: Provide vocational rehab assistance to qualifying candidates.   Vocational Rehab Evaluation & Intervention: Vocational Rehab - 05/21/19 1252      Initial Vocational Rehab Evaluation & Intervention   Assessment shows need for Vocational Rehabilitation  No       Education: Education Goals: Education classes will be provided on a weekly basis, covering required topics. Participant will state understanding/return demonstration of topics presented.  Learning Barriers/Preferences: Learning Barriers/Preferences - 05/21/19 1251      Learning Barriers/Preferences   Learning Barriers  None    Learning Preferences  Individual Instruction;Group Instruction;Skilled Demonstration       Education Topics: Hypertension, Hypertension Reduction -Define heart disease and high blood pressure. Discus how high blood pressure affects the body and ways to reduce high blood pressure.   Exercise and Your Heart -Discuss why it is important to exercise, the FITT principles of exercise, normal and abnormal responses to exercise, and how to exercise safely.   Angina -Discuss definition of angina, causes of angina, treatment of angina, and how to decrease risk of having angina.   Cardiac Medications -Review what the following cardiac medications are used for, how they affect the body, and side effects that may occur when taking the medications.  Medications include Aspirin, Beta blockers, calcium channel blockers, ACE Inhibitors, angiotensin receptor blockers, diuretics, digoxin, and antihyperlipidemics.   Congestive Heart Failure -Discuss the definition of CHF, how to live with CHF, the signs and symptoms of CHF, and how keep track of weight and sodium intake.   Heart Disease and Intimacy -Discus the effect sexual  activity has on the heart, how changes occur during intimacy as we age, and safety during sexual activity.   Smoking Cessation / COPD -Discuss different methods to quit smoking, the health benefits of quitting smoking, and the definition of COPD.   Nutrition I: Fats -Discuss the types of cholesterol, what cholesterol does to the heart, and how cholesterol levels can be controlled.   Nutrition II: Labels -Discuss the different components of food labels and how to read food label   Heart Parts/Heart Disease and PAD -Discuss the anatomy of the heart, the pathway of blood circulation through the heart, and these are affected by heart disease.   Stress I: Signs and Symptoms -Discuss the causes of stress, how stress may lead to anxiety and depression, and ways to limit stress.   Stress II: Relaxation -Discuss different types of relaxation techniques to limit stress.   Warning Signs of Stroke / TIA -Discuss definition of a stroke, what the signs and symptoms are of a stroke, and how to identify when someone is having stroke.   Knowledge Questionnaire Score: Knowledge Questionnaire Score - 05/21/19  1252      Knowledge Questionnaire Score   Pre Score  23/28       Core Components/Risk Factors/Patient Goals at Admission: Personal Goals and Risk Factors at Admission - 05/21/19 1252      Core Components/Risk Factors/Patient Goals on Admission    Weight Management  Weight Maintenance    Personal Goal Other  Yes    Personal Goal  Keep heart in rhythm, not feeling tired anymore. Live longer.    Intervention  Attend program 3 x week and to supplement with exercise at using home exercise prescription.    Expected Outcomes  To reach expected gaols.       Core Components/Risk Factors/Patient Goals Review:  Goals and Risk Factor Review    Row Name 06/02/19 1456             Core Components/Risk Factors/Patient Goals Review   Personal Goals Review  Weight Management/Obesity Get  heart in rhythm; not feel tired; live longer.       Review  Patient is new to the program completing 2 sessions. Her A-Fib presists and she is scheduled to have a cardioversion tomorrow 06/03/19. Hopefully, she will return to the program after she is cleared to do so. Will continue to monitor.       Expected Outcomes  Patient will continue to attend sessions and complete the program meeting her personal goals.          Core Components/Risk Factors/Patient Goals at Discharge (Final Review):  Goals and Risk Factor Review - 06/02/19 1456      Core Components/Risk Factors/Patient Goals Review   Personal Goals Review  Weight Management/Obesity   Get heart in rhythm; not feel tired; live longer.   Review  Patient is new to the program completing 2 sessions. Her A-Fib presists and she is scheduled to have a cardioversion tomorrow 06/03/19. Hopefully, she will return to the program after she is cleared to do so. Will continue to monitor.    Expected Outcomes  Patient will continue to attend sessions and complete the program meeting her personal goals.       ITP Comments: ITP Comments    Row Name 05/21/19 1138           ITP Comments  Patient came for her assessment/orientation visit today. She is eager to get started.          Comments: ITP REVIEW Pt is making expected progress toward Cardiac Rehab goals after completing 2 sessions. Recommend continued exercise, life style modification, education, and increased stamina and strength.

## 2019-06-03 ENCOUNTER — Other Ambulatory Visit: Payer: Self-pay

## 2019-06-03 ENCOUNTER — Ambulatory Visit (HOSPITAL_COMMUNITY): Payer: Medicaid Other | Admitting: Anesthesiology

## 2019-06-03 ENCOUNTER — Ambulatory Visit (HOSPITAL_COMMUNITY)
Admission: RE | Admit: 2019-06-03 | Discharge: 2019-06-03 | Disposition: A | Discharge status: Home / Self Care | Payer: Medicaid Other | Attending: Cardiovascular Disease | Admitting: Cardiovascular Disease

## 2019-06-03 ENCOUNTER — Encounter (HOSPITAL_COMMUNITY): Payer: Self-pay | Admitting: Cardiovascular Disease

## 2019-06-03 ENCOUNTER — Encounter (HOSPITAL_COMMUNITY)
Admission: RE | Disposition: A | Discharge status: Home / Self Care | Payer: Self-pay | Source: Home / Self Care | Attending: Cardiovascular Disease

## 2019-06-03 DIAGNOSIS — E1151 Type 2 diabetes mellitus with diabetic peripheral angiopathy without gangrene: Secondary | ICD-10-CM | POA: Diagnosis not present

## 2019-06-03 DIAGNOSIS — I745 Embolism and thrombosis of iliac artery: Secondary | ICD-10-CM | POA: Diagnosis not present

## 2019-06-03 DIAGNOSIS — I4819 Other persistent atrial fibrillation: Secondary | ICD-10-CM | POA: Diagnosis not present

## 2019-06-03 DIAGNOSIS — Z7901 Long term (current) use of anticoagulants: Secondary | ICD-10-CM | POA: Diagnosis not present

## 2019-06-03 DIAGNOSIS — I2510 Atherosclerotic heart disease of native coronary artery without angina pectoris: Secondary | ICD-10-CM | POA: Diagnosis not present

## 2019-06-03 DIAGNOSIS — I77810 Thoracic aortic ectasia: Secondary | ICD-10-CM | POA: Diagnosis not present

## 2019-06-03 DIAGNOSIS — Z79899 Other long term (current) drug therapy: Secondary | ICD-10-CM | POA: Insufficient documentation

## 2019-06-03 DIAGNOSIS — I252 Old myocardial infarction: Secondary | ICD-10-CM | POA: Diagnosis not present

## 2019-06-03 DIAGNOSIS — Z85828 Personal history of other malignant neoplasm of skin: Secondary | ICD-10-CM | POA: Insufficient documentation

## 2019-06-03 DIAGNOSIS — Z7982 Long term (current) use of aspirin: Secondary | ICD-10-CM | POA: Insufficient documentation

## 2019-06-03 DIAGNOSIS — F17210 Nicotine dependence, cigarettes, uncomplicated: Secondary | ICD-10-CM | POA: Diagnosis not present

## 2019-06-03 DIAGNOSIS — I10 Essential (primary) hypertension: Secondary | ICD-10-CM | POA: Insufficient documentation

## 2019-06-03 DIAGNOSIS — Z9884 Bariatric surgery status: Secondary | ICD-10-CM | POA: Insufficient documentation

## 2019-06-03 DIAGNOSIS — Z955 Presence of coronary angioplasty implant and graft: Secondary | ICD-10-CM | POA: Insufficient documentation

## 2019-06-03 DIAGNOSIS — R0989 Other specified symptoms and signs involving the circulatory and respiratory systems: Secondary | ICD-10-CM | POA: Insufficient documentation

## 2019-06-03 DIAGNOSIS — G4733 Obstructive sleep apnea (adult) (pediatric): Secondary | ICD-10-CM | POA: Insufficient documentation

## 2019-06-03 DIAGNOSIS — E785 Hyperlipidemia, unspecified: Secondary | ICD-10-CM | POA: Insufficient documentation

## 2019-06-03 DIAGNOSIS — Z7902 Long term (current) use of antithrombotics/antiplatelets: Secondary | ICD-10-CM | POA: Diagnosis not present

## 2019-06-03 DIAGNOSIS — E7800 Pure hypercholesterolemia, unspecified: Secondary | ICD-10-CM | POA: Diagnosis not present

## 2019-06-03 HISTORY — PX: CARDIOVERSION: SHX1299

## 2019-06-03 LAB — GLUCOSE, CAPILLARY
Glucose-Capillary: 120 mg/dL — ABNORMAL HIGH (ref 70–99)
Glucose-Capillary: 102 mg/dL — ABNORMAL HIGH (ref 70–99)

## 2019-06-03 SURGERY — CARDIOVERSION
Anesthesia: General

## 2019-06-03 MED ORDER — LACTATED RINGERS IV SOLN: INTRAVENOUS | Status: DC

## 2019-06-03 MED ORDER — LIDOCAINE HCL (CARDIAC) PF 100 MG/5ML IV SOSY
PREFILLED_SYRINGE | INTRAVENOUS | Status: DC | PRN
  Administered 2019-06-03: 80 mg via INTRAVENOUS

## 2019-06-03 NOTE — Anesthesia Procedure Notes (Signed)
Date/Time: 06/03/2019 9:19 AM Performed by: Vista Deck, CRNA Pre-anesthesia Checklist: Patient identified, Emergency Drugs available, Suction available, Timeout performed and Patient being monitored Patient Re-evaluated:Patient Re-evaluated prior to induction Oxygen Delivery Method: Nasal Cannula

## 2019-06-03 NOTE — Transfer of Care (Signed)
Immediate Anesthesia Transfer of Care Note  Patient: Amy Tran  Procedure(s) Performed: CARDIOVERSION (N/A )  Patient Location: PACU  Anesthesia Type:General  Level of Consciousness: awake, alert  and patient cooperative  Airway & Oxygen Therapy: Patient Spontanous Breathing and Patient connected to nasal cannula oxygen  Post-op Assessment: Report given to RN and Post -op Vital signs reviewed and stable  Post vital signs: Reviewed and stable  Last Vitals:  Vitals Value Taken Time  BP    Temp    Pulse    Resp    SpO2      Last Pain:  Vitals:   06/03/19 0825  TempSrc: Oral  PainSc: 3       Patients Stated Pain Goal: 7 ([id number])  Complications: No apparent anesthesia complications  SEE PACU FLOW SHEET FOR VITAL SIGNS

## 2019-06-03 NOTE — Anesthesia Preprocedure Evaluation (Signed)
Anesthesia Evaluation  Patient identified by MRN, date of birth, ID band Patient awake    Reviewed: Allergy & Precautions, H&P , NPO status , Patient's Chart, lab work & pertinent test results, reviewed documented beta blocker date and time   History of Anesthesia Complications (+) PONV and history of anesthetic complications  Airway Mallampati: II  TM Distance: >3 FB Neck ROM: full    Dental no notable dental hx. (+) Edentulous Upper, Edentulous Lower   Pulmonary asthma , Current Smoker,    Pulmonary exam normal breath sounds clear to auscultation       Cardiovascular Exercise Tolerance: Good hypertension, + Past MI  + dysrhythmias Atrial Fibrillation + Valvular Problems/Murmurs  Rhythm:regular Rate:Normal     Neuro/Psych negative neurological ROS  negative psych ROS   GI/Hepatic Neg liver ROS, GERD  ,  Endo/Other  negative endocrine ROSdiabetes  Renal/GU negative Renal ROS  negative genitourinary   Musculoskeletal   Abdominal   Peds  Hematology negative hematology ROS (+)   Anesthesia Other Findings   Reproductive/Obstetrics negative OB ROS                             Anesthesia Physical Anesthesia Plan  ASA: III  Anesthesia Plan: General   Post-op Pain Management:    Induction:   PONV Risk Score and Plan: 3 and Propofol infusion  Airway Management Planned:   Additional Equipment:   Intra-op Plan:   Post-operative Plan:   Informed Consent: I have reviewed the patients History and Physical, chart, labs and discussed the procedure including the risks, benefits and alternatives for the proposed anesthesia with the patient or authorized representative who has indicated his/her understanding and acceptance.     Dental Advisory Given  Plan Discussed with: CRNA  Anesthesia Plan Comments:         Anesthesia Quick Evaluation

## 2019-06-03 NOTE — Procedures (Signed)
Elective direct current cardioversion  Indication: Persistent atrial fibrillation  Description of procedure: After informed consent was obtained and preprocedure evaluation by Anesthesia, patient was taken to the procedure room. Timeout performed. Sedation was managed completely by the Anesthesia service, please refer to their documentation for details. Anterior and posterior chest pads were placed and connected to a biphasic defibrillator. A single synchronized shock of 150J was delivered resulting in sinus bradycardia with PACs, 50 bpm range. The patient remained hemodynamically stable throughout, and there were no immediate complications noted. Follow-up ECG obtained.  Kate Sable, M.D., F.A.C.C.

## 2019-06-03 NOTE — Progress Notes (Signed)
Electrical Cardioversion Procedure Note Amy Tran [id number] 06/07/1955  Procedure: Electrical Cardioversion Indications:  Atrial Fibrillation  Procedure Details Consent: Risks of procedure as well as the alternatives and risks of each were explained to the (patient/caregiver).  Consent for procedure obtained. Time Out: Verified patient identification, verified procedure, site/side was marked, verified correct patient position, special equipment/implants available, medications/allergies/relevent history reviewed, required imaging and test results available.  Performed at [phone]  Patient placed on cardiac monitor, pulse oximetry, supplemental oxygen as necessary.  Sedation given: propofol Pacer pads placed anterior and posterior chest.  Cardioverted 1 time(s).  Cardioverted at 150J.  Evaluation Findings: Post procedure EKG shows: NSR Complications: None Patient did tolerate procedure well.   Harmon Pier 06/03/2019, 12:12 PM

## 2019-06-03 NOTE — Anesthesia Postprocedure Evaluation (Signed)
Anesthesia Post Note  Patient: Amy Tran  Procedure(s) Performed: CARDIOVERSION (N/A )  Patient location during evaluation: PACU Anesthesia Type: General Level of consciousness: awake and alert and patient cooperative Pain management: satisfactory to patient Vital Signs Assessment: post-procedure vital signs reviewed and stable Respiratory status: spontaneous breathing Cardiovascular status: stable Postop Assessment: no apparent nausea or vomiting Anesthetic complications: no     Last Vitals:  Vitals:   06/03/19 1000 06/03/19 1009  BP: (!) 101/50 (!) 107/53  Pulse: (!) 46 (!) 48  Resp: (!) 21 (!) 23  Temp:    SpO2: 100% 100%    Last Pain:  Vitals:   06/03/19 0945  TempSrc:   PainSc: 0-No pain                 Breanna Mcdaniel

## 2019-06-03 NOTE — Interval H&P Note (Signed)
History and Physical Interval Note: No changes. Plan to proceed with DCCV.  06/03/2019 8:28 AM  Amy Tran  has presented today for surgery, with the diagnosis of a-fib.  The various methods of treatment have been discussed with the patient and family. After consideration of risks, benefits and other options for treatment, the patient has consented to  Procedure(s): CARDIOVERSION (N/A) as a surgical intervention.  The patient's history has been reviewed, patient examined, no change in status, stable for surgery.  I have reviewed the patient's chart and labs.  Questions were answered to the patient's satisfaction.     Kate Sable

## 2019-06-03 NOTE — Discharge Instructions (Signed)
General Anesthesia, Adult, Care After This sheet gives you information about how to care for yourself after your procedure. Your health care provider may also give you more specific instructions. If you have problems or questions, contact your health care provider. What can I expect after the procedure? After the procedure, the following side effects are common:  Pain or discomfort at the IV site.  Nausea.  Vomiting.  Sore throat.  Trouble concentrating.  Feeling cold or chills.  Weak or tired.  Sleepiness and fatigue.  Soreness and body aches. These side effects can affect parts of the body that were not involved in surgery. Follow these instructions at home:  For at least 24 hours after the procedure:  Have a responsible adult stay with you. It is important to have someone help care for you until you are awake and alert.  Rest as needed.  Do not: ? Participate in activities in which you could fall or become injured. ? Drive. ? Use heavy machinery. ? Drink alcohol. ? Take sleeping pills or medicines that cause drowsiness. ? Make important decisions or sign legal documents. ? Take care of children on your own. Eating and drinking  Follow any instructions from your health care provider about eating or drinking restrictions.  When you feel hungry, start by eating small amounts of foods that are soft and easy to digest (bland), such as toast. Gradually return to your regular diet.  Drink enough fluid to keep your urine pale yellow.  If you vomit, rehydrate by drinking water, juice, or clear broth. General instructions  If you have sleep apnea, surgery and certain medicines can increase your risk for breathing problems. Follow instructions from your health care provider about wearing your sleep device: ? Anytime you are sleeping, including during daytime naps. ? While taking prescription pain medicines, sleeping medicines, or medicines that make you drowsy.  Return to  your normal activities as told by your health care provider. Ask your health care provider what activities are safe for you.  Take over-the-counter and prescription medicines only as told by your health care provider.  If you smoke, do not smoke without supervision.  Keep all follow-up visits as told by your health care provider. This is important. Contact a health care provider if:  You have nausea or vomiting that does not get better with medicine.  You cannot eat or drink without vomiting.  You have pain that does not get better with medicine.  You are unable to pass urine.  You develop a skin rash.  You have a fever.  You have redness around your IV site that gets worse. Get help right away if:  You have difficulty breathing.  You have chest pain.  You have blood in your urine or stool, or you vomit blood. Summary  After the procedure, it is common to have a sore throat or nausea. It is also common to feel tired.  Have a responsible adult stay with you for the first 24 hours after general anesthesia. It is important to have someone help care for you until you are awake and alert.  When you feel hungry, start by eating small amounts of foods that are soft and easy to digest (bland), such as toast. Gradually return to your regular diet.  Drink enough fluid to keep your urine pale yellow.  Return to your normal activities as told by your health care provider. Ask your health care provider what activities are safe for you. This information is not   intended to replace advice given to you by your health care provider. Make sure you discuss any questions you have with your health care provider. Document Revised: 01/31/2017 Document Reviewed: 09/13/2016 Elsevier Patient Education  [place] PER DR. Jacinta Shoe

## 2019-06-04 ENCOUNTER — Encounter (HOSPITAL_COMMUNITY)
Admission: RE | Admit: 2019-06-04 | Discharge: 2019-06-04 | Disposition: A | Discharge status: Home / Self Care | Payer: Medicaid Other | Source: Ambulatory Visit | Attending: Cardiology | Admitting: Cardiology

## 2019-06-04 ENCOUNTER — Other Ambulatory Visit: Payer: Self-pay

## 2019-06-04 DIAGNOSIS — Z955 Presence of coronary angioplasty implant and graft: Secondary | ICD-10-CM

## 2019-06-04 DIAGNOSIS — I214 Non-ST elevation (NSTEMI) myocardial infarction: Secondary | ICD-10-CM | POA: Diagnosis not present

## 2019-06-04 NOTE — Progress Notes (Signed)
Daily Session Note  Patient Details  Name: Amy Tran MRN: [id number] Date of Birth: 1955-02-28 Referring Provider:     CARDIAC REHAB PHASE II ORIENTATION from 05/21/2019 in [place]  Referring Provider  Branch      Encounter Date: 06/04/2019  Check In: Session Check In - 06/04/19 0930      Check-In   Supervising physician immediately available to respond to emergencies  See telemetry face sheet for immediately available MD    Location  [facility]    Staff Present  Russella Dar, MS, EP, [facility], Exercise Physiologist;Prithvi Kooi Wynetta Emery, RN, BSN;Other    Virtual Visit  No    Medication changes reported      No    Fall or balance concerns reported     No    Tobacco Cessation  No Change    Warm-up and Cool-down  Performed as group-led instruction    Resistance Training Performed  Yes    VAD Patient?  No    PAD/SET Patient?  No      Pain Assessment   Currently in Pain?  No/denies    Pain Score  0-No pain    Multiple Pain Sites  No       Capillary Blood Glucose: No results found for this or any previous visit (from the past 24 hour(s)).    Social History   Tobacco Use  Smoking Status Current Every Day Smoker  . Packs/day: 0.50  . Years: 29.00  . Pack years: 14.50  . Types: Cigarettes  . Start date: 04/09/1970  Smokeless Tobacco Never Used    Goals Met:  Independence with exercise equipment Exercise tolerated well No report of cardiac concerns or symptoms Strength training completed today  Goals Unmet:  Not Applicable  Comments: Check out 1030.   Dr. Kate Sable is Medical Director for [facility] Cardiac and Pulmonary Rehab.

## 2019-06-07 ENCOUNTER — Encounter (HOSPITAL_COMMUNITY)
Admission: RE | Admit: 2019-06-07 | Discharge: 2019-06-07 | Disposition: A | Discharge status: Home / Self Care | Payer: Medicaid Other | Source: Ambulatory Visit | Attending: Cardiology | Admitting: Cardiology

## 2019-06-07 ENCOUNTER — Other Ambulatory Visit: Payer: Self-pay

## 2019-06-07 DIAGNOSIS — I214 Non-ST elevation (NSTEMI) myocardial infarction: Secondary | ICD-10-CM

## 2019-06-07 DIAGNOSIS — Z955 Presence of coronary angioplasty implant and graft: Secondary | ICD-10-CM

## 2019-06-07 NOTE — Progress Notes (Signed)
Daily Session Note  Patient Details  Name: Amy Tran MRN: [id number] Date of Birth: 1955/03/04 Referring Provider:     CARDIAC REHAB PHASE II ORIENTATION from 05/21/2019 in [place]  Referring Provider  Branch      Encounter Date: 06/07/2019  Check In: Session Check In - 06/07/19 0930      Check-In   Supervising physician immediately available to respond to emergencies  See telemetry face sheet for immediately available MD    Location  [facility]    Staff Present  Russella Dar, MS, EP, [facility], Exercise Physiologist;Karaline Buresh Wynetta Emery, RN, BSN;Other    Virtual Visit  No    Medication changes reported      No    Fall or balance concerns reported     No    Tobacco Cessation  No Change    Warm-up and Cool-down  Performed as group-led instruction    Resistance Training Performed  Yes    VAD Patient?  No    PAD/SET Patient?  No      Pain Assessment   Currently in Pain?  No/denies    Pain Score  0-No pain    Multiple Pain Sites  No       Capillary Blood Glucose: No results found for this or any previous visit (from the past 24 hour(s)).    Social History   Tobacco Use  Smoking Status Current Every Day Smoker  . Packs/day: 0.50  . Years: 29.00  . Pack years: 14.50  . Types: Cigarettes  . Start date: 04/09/1970  Smokeless Tobacco Never Used    Goals Met:  Independence with exercise equipment Exercise tolerated well No report of cardiac concerns or symptoms Strength training completed today  Goals Unmet:  Not Applicable  Comments: Check out 1030.   Dr. Kate Sable is Medical Director for [facility] Cardiac and Pulmonary Rehab.

## 2019-06-09 ENCOUNTER — Other Ambulatory Visit: Payer: Self-pay

## 2019-06-09 ENCOUNTER — Encounter (HOSPITAL_COMMUNITY)
Admission: RE | Admit: 2019-06-09 | Discharge: 2019-06-09 | Disposition: A | Discharge status: Home / Self Care | Payer: Medicaid Other | Source: Ambulatory Visit | Attending: Cardiology | Admitting: Cardiology

## 2019-06-09 DIAGNOSIS — I214 Non-ST elevation (NSTEMI) myocardial infarction: Secondary | ICD-10-CM

## 2019-06-09 DIAGNOSIS — Z955 Presence of coronary angioplasty implant and graft: Secondary | ICD-10-CM

## 2019-06-09 NOTE — Progress Notes (Signed)
Daily Session Note  Patient Details  Name: SHARINA PETRE MRN: [id number] Date of Birth: [birth year] Referring Provider:     CARDIAC REHAB PHASE II ORIENTATION from 05/21/2019 in [place]  Referring Provider  Branch      Encounter Date: 06/09/2019  Check In: Session Check In - 06/09/19 0930      Check-In   Supervising physician immediately available to respond to emergencies  See telemetry face sheet for immediately available MD    Location  [facility]    Staff Present  Russella Dar, MS, EP, [facility], Exercise Physiologist;Cuong Moorman Wynetta Emery, RN, BSN;Other    Virtual Visit  No    Medication changes reported      No    Fall or balance concerns reported     No    Tobacco Cessation  No Change    Warm-up and Cool-down  Performed as group-led instruction    Resistance Training Performed  Yes    VAD Patient?  No    PAD/SET Patient?  No      Pain Assessment   Currently in Pain?  No/denies    Pain Score  0-No pain    Multiple Pain Sites  No       Capillary Blood Glucose: No results found for this or any previous visit (from the past 24 hour(s)).    Social History   Tobacco Use  Smoking Status Current Every Day Smoker  . Packs/day: 0.50  . Years: 29.00  . Pack years: 14.50  . Types: Cigarettes  . Start date: 04/09/1970  Smokeless Tobacco Never Used    Goals Met:  Independence with exercise equipment Exercise tolerated well No report of cardiac concerns or symptoms Strength training completed today  Goals Unmet:  Not Applicable  Comments: Check out 1030.   Dr. Kate Sable is Medical Director for [facility] Cardiac and Pulmonary Rehab.

## 2019-06-11 ENCOUNTER — Encounter (HOSPITAL_COMMUNITY): Payer: Medicaid Other

## 2019-06-14 ENCOUNTER — Other Ambulatory Visit: Payer: Self-pay

## 2019-06-14 ENCOUNTER — Encounter (HOSPITAL_COMMUNITY)
Admission: RE | Admit: 2019-06-14 | Discharge: 2019-06-14 | Disposition: A | Discharge status: Home / Self Care | Payer: Medicaid Other | Source: Ambulatory Visit | Attending: Cardiology | Admitting: Cardiology

## 2019-06-14 DIAGNOSIS — Z955 Presence of coronary angioplasty implant and graft: Secondary | ICD-10-CM | POA: Diagnosis present

## 2019-06-14 DIAGNOSIS — I214 Non-ST elevation (NSTEMI) myocardial infarction: Secondary | ICD-10-CM | POA: Insufficient documentation

## 2019-06-14 NOTE — Progress Notes (Signed)
Daily Session Note  Patient Details  Name: Amy Tran MRN: [id number] Date of Birth: 12-02-1955 Referring Provider:     CARDIAC REHAB PHASE II ORIENTATION from 05/21/2019 in [place]  Referring Provider  Branch      Encounter Date: 06/14/2019  Check In: Session Check In - 06/14/19 1004      Check-In   Supervising physician immediately available to respond to emergencies  See telemetry face sheet for immediately available MD    Location  [facility]    Staff Present  Russella Dar, MS, EP, [facility], Exercise Physiologist;Debra Wynetta Emery, RN, BSN    Virtual Visit  No    Medication changes reported      No    Fall or balance concerns reported     No    Tobacco Cessation  No Change    Warm-up and Cool-down  Performed as group-led instruction    Resistance Training Performed  Yes    VAD Patient?  No    PAD/SET Patient?  No      Pain Assessment   Currently in Pain?  No/denies    Pain Score  0-No pain    Multiple Pain Sites  No       Capillary Blood Glucose: No results found for this or any previous visit (from the past 24 hour(s)).    Social History   Tobacco Use  Smoking Status Current Every Day Smoker  . Packs/day: 0.50  . Years: 29.00  . Pack years: 14.50  . Types: Cigarettes  . Start date: 04/09/1970  Smokeless Tobacco Never Used    Goals Met:  Independence with exercise equipment Exercise tolerated well Personal goals reviewed No report of cardiac concerns or symptoms Strength training completed today  Goals Unmet:  Not Applicable  Comments: Check out: 1015   Dr. Kate Sable is Medical Director for [facility] and Pulmonary Rehab.

## 2019-06-16 ENCOUNTER — Encounter (HOSPITAL_COMMUNITY): Payer: Medicaid Other

## 2019-06-18 ENCOUNTER — Ambulatory Visit: Payer: Medicaid Other | Admitting: Student

## 2019-06-18 ENCOUNTER — Encounter (HOSPITAL_COMMUNITY): Payer: Medicaid Other

## 2019-06-18 ENCOUNTER — Other Ambulatory Visit: Payer: Self-pay

## 2019-06-21 ENCOUNTER — Encounter (HOSPITAL_COMMUNITY): Payer: Medicaid Other

## 2019-06-21 NOTE — Progress Notes (Addendum)
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  NSTEMI 04/25/2019 (DES to proximal RCA).  Last saw [name] on 05/24/2019.  She complained of fatigue.  She had started cardiac rehab that day.  She remained in atrial fibrillation.  She was still smoking 1 pack/day and was trying to quit. Plans were to continue triple anticoagulant therapy for one month then to stop ASA. Not on BB d/t bradycardia. Switched from Zocor to Atorvastatin. PVD (left iliac vein thrombosis followed by Dr Oneida Alar on [place]). Ascending aortic dilation 69mm.(yearly f/u needed). Was smoking 15 cig/day.   She is here for follow-up today.  States she is having significant fatigue. She states after her cardioversion she felt good for a couple of weeks but then started feeling more fatigued.  Otherwise she denies any progressive anginal or exertional symptoms, palpitations or arrhythmias, orthostatic symptoms, CVA or TIA-like symptoms, bleeding issues.  She states her aspirin was stopped and she continues on Xarelto and Plavix.  She denies any claudication-like symptoms, she does have bilateral lower extremity edema left greater than right.  She states this is a chronic problem.  States she has had an arterial stent placed in the past.  States she has had multiple small blood clots in her left leg which is left her with chronic  lower extremity edema/lymphedema.    Past Medical History:  Diagnosis Date  . Arthritis   . Asthma   . Cancer [facility])    multiple skin cancers  . Diabetes mellitus without complication ([place])   . Diverticulosis   . Dysrhythmia   . Fluid retention   . GERD (gastroesophageal reflux disease)   . Heart murmur   . High cholesterol   . Hypertension   . Incomplete rotator cuff tear   . Myocardial infarction ([place])   . PONV (postoperative nausea and vomiting)   . Wears glasses   . Wears partial dentures    bottom    Past Surgical History:  Procedure Laterality Date  . APPENDECTOMY    . CARDIOVERSION N/A 06/03/2019   Procedure: CARDIOVERSION;  Surgeon: Herminio Commons, MD;  Location: [facility] ORS;  Service: Cardiovascular;  Laterality: N/A;  . CARPAL TUNNEL RELEASE Bilateral 2001   bilateral  . CERVICAL POLYPECTOMY  04/08/2017   Procedure: POLYPECTOMY;  Surgeon: [name], MD;  Location: [facility] ORS;  Service: Gynecology;;  Endometrial  . CHOLECYSTECTOMY    . COLONOSCOPY  2007   [id number] internal hemorrhoids. Diminutive rectal polyp at 10 cm, cold biopsied/removed. The remainder of the rectal mucosa appeared normal Swallow left-sided diverticula. Diminutive polyp at the splenic flexure cold biopsied/removed (adenomatous)  . COLONOSCOPY N/A 12/28/2013   Procedure: COLONOSCOPY;  Surgeon: Daneil Dolin, MD;  Location: [facility] ENDO SUITE;  Service: Endoscopy;  Laterality: N/A;  730 - moved to 8:30 - Ginger notified pt  . CORONARY STENT INTERVENTION N/A 04/27/2019   Procedure: CORONARY STENT INTERVENTION;  Surgeon: Shelva Majestic  A, MD;  Location: [place] CV LAB;  Service: Cardiovascular;  Laterality: N/A;  . DILATION AND CURETTAGE, DIAGNOSTIC / THERAPEUTIC  03/07/2017  . ERCP  2002  . GASTRIC BYPASS  2004  . HYSTEROSCOPY WITH D & C N/A 04/08/2017   Procedure: DILATATION AND CURETTAGE /HYSTEROSCOPY;  Surgeon: [name], MD;  Location: [facility];  Service: Gynecology;  Laterality: N/A;    . INTRAVASCULAR ULTRASOUND/IVUS N/A 09/12/2017   Procedure: INTRAVASCULAR ULTRASOUND/IVUS;  Surgeon: Elam Dutch, MD;  Location: [place] CV LAB;  Service: Cardiovascular;  Laterality: N/A;  . LEFT HEART CATH AND CORONARY ANGIOGRAPHY N/A 04/27/2019   Procedure: LEFT HEART CATH AND CORONARY ANGIOGRAPHY;  Surgeon: Troy Sine, MD;  Location: [place] CV LAB;  Service: Cardiovascular;  Laterality: N/A;  . NECK SURGERY  1998   fusion  . PERCUTANEOUS VENOUS THROMBECTOMY,LYSIS WITH INTRAVASCULAR ULTRASOUND (IVUS) Left 09/13/2017   Procedure: REMOVAL OF LYSIS CATHETER AND INTRAVASCULAR ULTRASOUND (IVUS) LEFT ILIAC VEIN;  Surgeon: Elam Dutch, MD;  Location: [place];  Service: Vascular;  Laterality: Left;  . PERIPHERAL VASCULAR INTERVENTION  09/15/2017   Procedure: PERIPHERAL VASCULAR INTERVENTION;  Surgeon: Waynetta Sandy, MD;  Location: [place] CV LAB;  Service: Cardiovascular;;  VEINOUS/STENT  . PERIPHERAL VASCULAR THROMBECTOMY Left 09/12/2017   Procedure: PERIPHERAL VASCULAR THROMBECTOMY;  Surgeon: Elam Dutch, MD;  Location: [place] CV LAB;  Service: Cardiovascular;  Laterality: Left;  . PERIPHERAL VASCULAR THROMBECTOMY Left 09/15/2017   Procedure: PERIPHERAL VASCULAR THROMBECTOMY;  Surgeon: Waynetta Sandy, MD;  Location: [place] CV LAB;  Service: Cardiovascular;  Laterality: Left;  IVC TO LT FEM/POP VEIN  . SHOULDER ARTHROSCOPY WITH SUBACROMIAL DECOMPRESSION Left 06/23/2013   Procedure: LEFT SHOULDER ARTHROSCOPY WITH DEBRIDEMENT ROTATOR CUFF AND LABRUM;  Surgeon: Lorn Junes, MD;  Location: [place];  Service: Orthopedics;  Laterality: Left;  . SHOULDER SURGERY  1999   left    Current Outpatient Medications  Medication Sig Dispense Refill  . acetaminophen (TYLENOL) 500 MG tablet Take 1,000 mg by mouth 2 (two) times daily as needed for moderate pain.    Mar[place] Kitchen albuterol (VENTOLIN HFA) 108 (90 Base) MCG/ACT inhaler Inhale 2 puffs  into the lungs every 6 (six) hours as needed for wheezing or shortness of breath.    Marland Kitchen amLODipine (NORVASC) 5 MG tablet Take 5 mg by mouth daily.    Marland Kitchen aspirin EC 81 MG tablet Take 81 mg by mouth daily.    Marland Kitchen atorvastatin (LIPITOR) 80 MG tablet Take 1 tablet (80 mg total) by mouth daily at 6 PM. (Patient taking differently: Take 80 mg by mouth at bedtime. ) 90 tablet 1  . BD PEN NEEDLE NANO U/F 32G X 4 MM MISC USE FOR VICTOZA INJECTIONS    . canagliflozin (INVOKANA) 300 MG TABS tablet Take 300 mg by mouth daily before breakfast.    . cetirizine (ZYRTEC) 10 MG tablet Take 10 mg by mouth daily. For itchy palms    . clopidogrel (PLAVIX) 75 MG tablet Take 1 tablet (75 mg total) by mouth daily with breakfast. 90 tablet 3  . Cyanocobalamin (B-12) 5000 MCG CAPS Take 5,000 mcg by mouth daily.    . diphenhydrAMINE (BENADRYL) 2 % cream Apply 1 application topically 3 (three) times daily as needed for itching. For itchy palms    . fluticasone (FLONASE) 50 MCG/ACT nasal spray Place 2 sprays into both nostrils as needed for allergies.     Marland Kitchen losartan (COZAAR) 100 MG  tablet Take 100 mg by mouth daily.    . nicotine (NICODERM CQ - DOSED IN MG/24 HOURS) 14 mg/24hr patch Place 1 patch (14 mg total) onto the skin daily. 14 patch 0  . nicotine (NICODERM CQ - DOSED IN MG/24 HOURS) 21 mg/24hr patch Place 1 patch (21 mg total) onto the skin daily. 42 patch 0  . nicotine (NICODERM CQ - DOSED IN MG/24 HR) 7 mg/24hr patch Place 1 patch (7 mg total) onto the skin daily. 14 patch 0  . nitroGLYCERIN (NITROSTAT) 0.4 MG SL tablet Place 1 tablet (0.4 mg total) under the tongue every 5 (five) minutes as needed for chest pain. 30 tablet 0  . potassium chloride SA (K-DUR,KLOR-CON) 20 MEQ tablet Take 1 tablet (20 mEq total) by mouth daily. (Patient taking differently: Take 20 mEq by mouth 3 (three) times daily. )    . rivaroxaban (XARELTO) 20 MG TABS tablet Take 1 tablet (20 mg total) by mouth daily with supper. 90 tablet 3  .  spironolactone (ALDACTONE) 25 MG tablet Take 25 mg by mouth daily.    Marland Kitchen torsemide (DEMADEX) 20 MG tablet TAKE 2 TABS IN AM AND 1 TAB IN PM (Patient taking differently: Take 60 mg by mouth daily. ) 90 tablet 3  . VICTOZA 18 MG/3ML SOPN Inject 1.8 mg into the skin at bedtime.      No current facility-administered medications for this visit.   Allergies:  Penicillins   Social History: The patient  reports that she has been smoking cigarettes. She started smoking about 49 years ago. She has a 14.50 pack-year smoking history. She has never used smokeless tobacco. She reports previous alcohol use. She reports that she does not use drugs.   Family History: The patient's family history includes Cancer in her daughter and father; Diabetes in her brother and father; Drug abuse in her sister; Heart disease in her mother; Hypertension in her brother, father, and mother; Mesothelioma in her brother; Parkinson's disease in her father.   ROS:  Please see the history of present illness. Otherwise, complete review of systems is positive for none.  All other systems are reviewed and negative.   Physical Exam: VS:  There were no vitals taken for this visit., BMI There is no height or weight on file to calculate BMI.  Wt Readings from Last 3 Encounters:  06/03/19 278 lb (126.1 kg)  06/01/19 278 lb (126.1 kg)  05/24/19 279 lb (126.6 kg)    General: Patient appears comfortable at rest. Neck: Supple, no elevated JVP or carotid bruits, no thyromegaly. Lungs: Clear to auscultation, nonlabored breathing at rest. Cardiac: Irregularly irregular rate and rhythm, no S3 or significant systolic murmur, no pericardial rub. Extremities: Bilateral lower extremity edema left greater than right.  Patient states she has had some previous blood clots in that left leg which left her with chronic lower extremity edema, distal pulses 2+. Skin: Warm and dry. Musculoskeletal: No kyphosis. Neuropsychiatric: Alert and oriented x3,  affect grossly appropriate.  ECG:  An ECG dated 06/22/2019 was personally reviewed today and demonstrated:  Heart rate 60 possible atrial fibrillation occasional ectopic ventricular beat.  Recent Labwork: 01/03/2019: B Natriuretic Peptide 138.0 06/01/2019: ALT 13; AST 18; BUN 16; Creatinine, Ser 0.74; Hemoglobin 12.7; Platelets 381; Potassium 3.9; Sodium 137; TSH 0.567     Component Value Date/Time   CHOL 174 04/26/2019 0010   TRIG 110 04/26/2019 0010   HDL 41 04/26/2019 0010   CHOLHDL 4.2 04/26/2019 0010   VLDL 22  04/26/2019 0010   LDLCALC 111 (H) 04/26/2019 0010    Other Studies Reviewed Today:  Carotid duplex 06/01/2019 IMPRESSION: Color duplex indicates minimal heterogeneous plaque, with no hemodynamically significant stenosis by duplex criteria in the extracranial cerebrovascular circulation.   Cath: 04/27/19   Prox RCA lesion is 95% stenosed.  Post intervention, there is a 0% residual stenosis.  A stent was successfully placed.  Acute coronary syndrome secondary to high-grade thrombotic stenosis in the very proximal RCA in a dominant RCA vessel.  Normal left coronary circulation with a short left main, large LAD and left circumflex vessels.  Dominant RCA with 95% very proximal stenosis with thrombus burden with TIMI-3 flow.  Successful percutaneous coronary prevention with PTCA and ultimate stenting with a 3.0 x18 mm Resolute Onyx DES stent postdilated to 3.73 mm tapering to 3.68 mm with the percent stenosis being reduced to 0% and brisk TIMI-3 flow without evidence for dissection.  RECOMMENDATION: Consider initial triple drug therapy for 1 month with continuation of Plavix/Xarelto for at least 6 months in this patient on long-term anticoagulation therapy.   2D echo 04/28/2019 IMPRESSIONS    1. Left ventricular ejection fraction, by estimation, is 55 to 60%. Left  ventricular ejection fraction by 3D volume is 58 %. The left ventricle has  normal  function. The left ventricle has no regional wall motion  abnormalities. The left ventricular  internal cavity size was mildly dilated. There is mild left ventricular  hypertrophy. Left ventricular diastolic parameters are indeterminate.  2. Right ventricular systolic function is normal. The right ventricular  size is mildly enlarged. There is mildly elevated pulmonary artery  systolic pressure. The estimated right ventricular systolic pressure is  99991111 mmHg.  3. The mitral valve is normal in structure. Mild mitral valve  regurgitation.  4. The aortic valve is tricuspid. Aortic valve regurgitation is not  visualized. Mild aortic stenosis. Vmax 2.8 m/s, MG 46mmHg, AVA 1.4 cm^2,  DI 0.55  5. Aortic dilatation noted. There is mild dilatation of the ascending  aorta measuring 36 mm.  6. The inferior vena cava is dilated in size with <50% respiratory  variability, suggesting right atrial pressure of 15 mmHg.   FINDINGS  Left Ventricle: Left ventricular ejection fraction, by estimation, is 55  to 60%. Left ventricular ejection fraction by 3D volume is 58 % The left  ventricle has normal function. The left ventricle has no regional wall  motion abnormalities. The left  ventricular internal cavity size was mildly dilated. There is mild left  ventricular hypertrophy. Left ventricular diastolic parameters are  indeterminate.   Right Ventricle: The right ventricular size is mildly enlarged. No  increase in right ventricular wall thickness. Right ventricular systolic  function is normal. There is mildly elevated pulmonary artery systolic  pressure. The tricuspid regurgitant  velocity is 2.25 m/s, and with an assumed right atrial pressure of 15  mmHg, the estimated right ventricular systolic pressure is 99991111 mmHg.   Left Atrium: Left atrial size was normal in size.   Right Atrium: Right atrial size was normal in size.   Pericardium: There is no evidence of pericardial effusion.    Mitral Valve: The mitral valve is normal in structure. Mild mitral valve  regurgitation.   Tricuspid Valve: The tricuspid valve is normal in structure. Tricuspid  valve regurgitation is trivial.   Aortic Valve: The aortic valve is tricuspid. Aortic valve regurgitation is  not visualized. Mild aortic stenosis is present. Aortic valve mean  gradient measures 7.0 mmHg. Aortic  valve peak gradient measures 13.8 mmHg.  Aortic valve area, by VTI measures  2.15 cm.   Pulmonic Valve: The pulmonic valve was not well visualized. Pulmonic valve  regurgitation is trivial.   Aorta: Aortic dilatation noted. There is mild dilatation of the ascending  aorta measuring 36 mm.   Venous: The inferior vena cava is dilated in size with less than 50%  respiratory variability, suggesting right atrial pressure of 15 mmHg.   IAS/Shunts: No atrial level shunt detected by color flow Doppler.   Assessment and Plan:  1. CAD in native artery   2. Persistent atrial fibrillation ([place])   3. Essential hypertension   4. PVD (peripheral vascular disease) ([place])   5. Tobacco abuse    1. CAD in native artery She denies any recent progressive anginal or exertional symptoms since cardiac intervention on 04/27/2019 with stent placed to RCA.  Continue Plavix 75 mg p.o. daily.  Continue nitroglycerin 0.4 mg sublingual as needed  2. Persistent atrial fibrillation ([place]) Recent successful cardioversion on 06/03/2019.  Patient states she felt good for approximately 2 weeks and the last 2 weeks she has been having more fatigue.  EKG today shows atrial fibrillation with a rate of 60 with 1 premature ventricular complex.  Patient is open to repeat cardioversion if this is an option.  She states she wants to feel better and believes the atrial fibrillation is contributing to her fatigue.  Continue Xarelto 20 mg daily.    3. Essential hypertension She is normotensive today with a blood pressure 118/68.  Continue amlodipine 5 mg  p.o. daily.  Spironolactone 25 mg daily, torsemide 60 mg daily.  Continue losartan 100 mg p.o. daily.  4. PVD (peripheral vascular disease) ([place]) Denies any recent claudication-like symptoms.  She sees vascular for follow-up on peripheral vascular disease.  5. Tobacco abuse She continues to smoke.  Highly advised to stop.  Medication Adjustments/Labs and Tests Ordered: Current medicines are reviewed at length with the patient today.  Concerns regarding medicines are outlined above.   Disposition: Follow-up with Dr. Harl Bowie or APP 3 months  Signed, Levell July, NP 06/21/2019 9:39 PM    [facility] [place], [place], [place] [address] Phone: [phone]; Fax: [phone]

## 2019-06-22 ENCOUNTER — Encounter: Payer: Self-pay | Admitting: Family Medicine

## 2019-06-22 ENCOUNTER — Other Ambulatory Visit: Payer: Self-pay

## 2019-06-22 ENCOUNTER — Ambulatory Visit (INDEPENDENT_AMBULATORY_CARE_PROVIDER_SITE_OTHER): Payer: Medicaid Other | Admitting: Family Medicine

## 2019-06-22 VITALS — BP 118/68 | HR 69 | Ht 64.0 in | Wt 281.4 lb

## 2019-06-22 DIAGNOSIS — I739 Peripheral vascular disease, unspecified: Secondary | ICD-10-CM

## 2019-06-22 DIAGNOSIS — Z720 Tobacco use: Secondary | ICD-10-CM

## 2019-06-22 DIAGNOSIS — I4819 Other persistent atrial fibrillation: Secondary | ICD-10-CM

## 2019-06-22 DIAGNOSIS — I10 Essential (primary) hypertension: Secondary | ICD-10-CM | POA: Diagnosis not present

## 2019-06-22 DIAGNOSIS — I2510 Atherosclerotic heart disease of native coronary artery without angina pectoris: Secondary | ICD-10-CM | POA: Diagnosis not present

## 2019-06-22 NOTE — Patient Instructions (Signed)
Medication Instructions:  Continue all current medications.   Labwork: none  Testing/Procedures: none  Follow-Up: 6 months   Any Other Special Instructions Will Be Listed Below (If Applicable).   If you need a refill on your cardiac medications before your next appointment, please call your pharmacy.  

## 2019-06-23 ENCOUNTER — Encounter (HOSPITAL_COMMUNITY): Payer: Medicaid Other

## 2019-06-23 NOTE — Progress Notes (Signed)
Cardiac Individual Treatment Plan  Patient Details  Name: Amy Tran MRN: [id number] Date of Birth: 1955/11/02 Referring Provider:     CARDIAC REHAB [place] from 05/21/2019 in [place]  Referring Provider  Branch      Initial Encounter Date:    CARDIAC REHAB PHASE II ORIENTATION from 05/21/2019 in [place]  Date  05/21/19      Visit Diagnosis: NSTEMI (non-ST elevated myocardial infarction) [facility])  Status post coronary artery stent placement  Patient's Home Medications on Admission:  Current Outpatient Medications:  .  acetaminophen (TYLENOL) 500 MG tablet, Take 1,000 mg by mouth 2 (two) times daily as needed for moderate pain., Disp: , Rfl:  .  albuterol (VENTOLIN HFA) 108 (90 Base) MCG/ACT inhaler, Inhale 2 puffs into the lungs every 6 (six) hours as needed for wheezing or shortness of breath., Disp: , Rfl:  .  amLODipine (NORVASC) 5 MG tablet, Take 5 mg by mouth daily., Disp: , Rfl:  .  atorvastatin (LIPITOR) 80 MG tablet, Take 1 tablet (80 mg total) by mouth daily at 6 PM. (Patient taking differently: Take 80 mg by mouth at bedtime. ), Disp: 90 tablet, Rfl: 1 .  BD PEN NEEDLE NANO U/F 32G X 4 MM MISC, USE FOR VICTOZA INJECTIONS, Disp: , Rfl:  .  cetirizine (ZYRTEC) 10 MG tablet, Take 10 mg by mouth as needed for allergies., Disp: , Rfl:  .  clopidogrel (PLAVIX) 75 MG tablet, Take 1 tablet (75 mg total) by mouth daily with breakfast., Disp: 90 tablet, Rfl: 3 .  Cyanocobalamin (B-12) 5000 MCG CAPS, Take 5,000 mcg by mouth daily., Disp: , Rfl:  .  diphenhydrAMINE (BENADRYL) 2 % cream, Apply 1 application topically 3 (three) times daily as needed for itching. For itchy palms, Disp: , Rfl:  .  fluticasone (FLONASE) 50 MCG/ACT nasal spray, Place 2 sprays into both nostrils as needed for allergies. , Disp: , Rfl:  .  losartan (COZAAR) 100 MG tablet, Take 100 mg by mouth daily., Disp: , Rfl:  .  nicotine (NICODERM CQ -  DOSED IN MG/24 HOURS) 14 mg/24hr patch, Place 1 patch (14 mg total) onto the skin daily. (Patient not taking: Reported on 06/22/2019), Disp: 14 patch, Rfl: 0 .  nicotine (NICODERM CQ - DOSED IN MG/24 HOURS) 21 mg/24hr patch, Place 1 patch (21 mg total) onto the skin daily. (Patient not taking: Reported on 06/22/2019), Disp: 42 patch, Rfl: 0 .  nicotine (NICODERM CQ - DOSED IN MG/24 HR) 7 mg/24hr patch, Place 1 patch (7 mg total) onto the skin daily. (Patient not taking: Reported on 06/22/2019), Disp: 14 patch, Rfl: 0 .  nitroGLYCERIN (NITROSTAT) 0.4 MG SL tablet, Place 1 tablet (0.4 mg total) under the tongue every 5 (five) minutes as needed for chest pain., Disp: 30 tablet, Rfl: 0 .  potassium chloride SA (KLOR-CON) 20 MEQ tablet, Take 20 mEq by mouth daily. Takes 3 per day as needed for cramps ., Disp: , Rfl:  .  rivaroxaban (XARELTO) 20 MG TABS tablet, Take 1 tablet (20 mg total) by mouth daily with supper., Disp: 90 tablet, Rfl: 3 .  spironolactone (ALDACTONE) 25 MG tablet, Take 25 mg by mouth daily., Disp: , Rfl:  .  torsemide (DEMADEX) 20 MG tablet, Take 60 mg by mouth every morning., Disp: , Rfl:  .  VICTOZA 18 MG/3ML SOPN, Inject 1.8 mg into the skin at bedtime. , Disp: , Rfl:   Past Medical History: Past Medical  History:  Diagnosis Date  . Arthritis   . Asthma   . Cancer [facility])    multiple skin cancers  . Diabetes mellitus without complication ([place])   . Diverticulosis   . Dysrhythmia   . Fluid retention   . GERD (gastroesophageal reflux disease)   . Heart murmur   . High cholesterol   . Hypertension   . Incomplete rotator cuff tear   . Myocardial infarction ([place])   . PONV (postoperative nausea and vomiting)   . Wears glasses   . Wears partial dentures    bottom    Tobacco Use: Social History   Tobacco Use  Smoking Status Current Every Day Smoker  . Packs/day: 0.50  . Years: 29.00  . Pack years: 14.50  . Types: Cigarettes  . Start date: 04/09/1970  Smokeless Tobacco  Never Used  Tobacco Comment   10 ciggs per day - 06/22/19    Labs: Recent Review Flowsheet Data    Labs for ITP Cardiac and Pulmonary Rehab Latest Ref Rng & Units 04/02/2017 06/08/2017 09/12/2017 04/26/2019 04/27/2019   Cholestrol 0 - 200 mg/dL - - - 174 -   LDLCALC 0 - 99 mg/dL - - - 111(H) -   HDL >40 mg/dL - - - 41 -   Trlycerides <150 mg/dL - - - 110 -   Hemoglobin A1c 4.8 - 5.6 % 9.0(H) - - - 8.5(H)   TCO2 22 - 32 mmol/L - 35(H) 32 - -      Capillary Blood Glucose: Lab Results  Component Value Date   GLUCAP 102 (H) 06/03/2019   GLUCAP 120 (H) 06/03/2019   GLUCAP 179 (H) 04/28/2019   GLUCAP 153 (H) 04/28/2019   GLUCAP 117 (H) 04/27/2019     Exercise Target Goals: Exercise Program Goal: Individual exercise prescription set using results from initial 6 min walk test and THRR while considering  patient's activity barriers and safety.   Exercise Prescription Goal: Starting with aerobic activity 30 plus minutes a day, 3 days per week for initial exercise prescription. Provide home exercise prescription and guidelines that participant acknowledges understanding prior to discharge.  Activity Barriers & Risk Stratification: Activity Barriers & Cardiac Risk Stratification - 05/21/19 1200      Activity Barriers & Cardiac Risk Stratification   Activity Barriers  Other (comment);Deconditioning    Comments  (L) leg swollen due to poor circulation/PVD    Cardiac Risk Stratification  High       6 Minute Walk: 6 Minute Walk    Row Name 05/21/19 1155         6 Minute Walk   Phase  Initial     Distance  600 feet     Walk Time  6 minutes     # of Rest Breaks  1     MPH  1.13     METS  1.87     RPE  10     Perceived Dyspnea   10     VO2 Peak  2.79     Symptoms  Yes (comment)     Comments  (L) leg became heavy during walk test due to PVD.     Resting HR  63 bpm     Resting BP  160/92     Resting Oxygen Saturation   100 %     Exercise Oxygen Saturation  during 6 min walk  100  %     Max Ex. HR  81 bpm     Max  Ex. BP  150/82     2 Minute Post BP  142/82        Oxygen Initial Assessment:   Oxygen Re-Evaluation:   Oxygen Discharge (Final Oxygen Re-Evaluation):   Initial Exercise Prescription: Initial Exercise Prescription - 05/21/19 1200      Date of Initial Exercise RX and Referring Provider   Date  05/21/19    Referring Provider  Branch    Expected Discharge Date  08/20/19      Arm Ergometer   Level  1    Watts  10    RPM  40    Minutes  17    METs  1.6      T5 Nustep   Level  1    SPM  48    Minutes  22    METs  2.9      Prescription Details   Frequency (times per week)  3    Duration  Progress to 30 minutes of continuous aerobic without signs/symptoms of physical distress      Intensity   THRR 40-80% of Max Heartrate  100-119-137    Ratings of Perceived Exertion  11-13    Perceived Dyspnea  0-4      Progression   Progression  Continue to progress workloads to maintain intensity without signs/symptoms of physical distress.      Resistance Training   Training Prescription  Yes    Weight  1    Reps  10-15       Perform Capillary Blood Glucose checks as needed.  Exercise Prescription Changes:   Exercise Comments:  Exercise Comments    Row Name 05/21/19 1219 05/31/19 1624 06/24/19 0747       Exercise Comments  This is the patient's assessment and orientation visit. Patient did well with the her first day experience and is eager to get started.  Patient has only been to 2 visits. She is having a cardioversion done. Her heart doctor has asked her not to return until that procedure has been done and they have released her to resume exercise. Once she returns we will progress her as tolerated.  Patients goals are to get heart in rhythm and not to feel tired. She wants to live longer. Patient has not been in class since 06/14/19 due to not feeling well and being tired. She has found out that her heart is out of rhythm again and she  stated she would not be back until her heart doctors do something to get heart back in rhythm. We will progress as tolerated once she returns.        Exercise Goals and Review:  Exercise Goals    Row Name 05/21/19 1216             Exercise Goals   Increase Physical Activity  Yes       Intervention  Provide advice, education, support and counseling about physical activity/exercise needs.;Develop an individualized exercise prescription for aerobic and resistive training based on initial evaluation findings, risk stratification, comorbidities and participant's personal goals.       Expected Outcomes  Short Term: Attend rehab on a regular basis to increase amount of physical activity.;Long Term: Add in home exercise to make exercise part of routine and to increase amount of physical activity.       Increase Strength and Stamina  Yes       Intervention  Provide advice, education, support and counseling about physical activity/exercise needs.;Develop an individualized exercise prescription  for aerobic and resistive training based on initial evaluation findings, risk stratification, comorbidities and participant's personal goals.       Expected Outcomes  Short Term: Perform resistance training exercises routinely during rehab and add in resistance training at home;Long Term: Improve cardiorespiratory fitness, muscular endurance and strength as measured by increased METs and functional capacity (6MWT)       Able to understand and use rate of perceived exertion (RPE) scale  Yes       Intervention  Provide education and explanation on how to use RPE scale       Expected Outcomes  Short Term: Able to use RPE daily in rehab to express subjective intensity level;Long Term:  Able to use RPE to guide intensity level when exercising independently       Able to understand and use Dyspnea scale  Yes       Intervention  Provide education and explanation on how to use Dyspnea scale       Expected Outcomes   Short Term: Able to use Dyspnea scale daily in rehab to express subjective sense of shortness of breath during exertion;Long Term: Able to use Dyspnea scale to guide intensity level when exercising independently       Knowledge and understanding of Target Heart Rate Range (THRR)  Yes       Intervention  Provide education and explanation of THRR including how the numbers were predicted and where they are located for reference       Expected Outcomes  Short Term: Able to use daily as guideline for intensity in rehab;Long Term: Able to use THRR to govern intensity when exercising independently       Able to check pulse independently  Yes       Intervention  Provide education and demonstration on how to check pulse in carotid and radial arteries.;Review the importance of being able to check your own pulse for safety during independent exercise       Expected Outcomes  Short Term: Able to explain why pulse checking is important during independent exercise;Long Term: Able to check pulse independently and accurately       Understanding of Exercise Prescription  Yes       Intervention  Provide education, explanation, and written materials on patient's individual exercise prescription       Expected Outcomes  Short Term: Able to explain program exercise prescription;Long Term: Able to explain home exercise prescription to exercise independently          Exercise Goals Re-Evaluation :    Discharge Exercise Prescription (Final Exercise Prescription Changes):   Nutrition:  Target Goals: Understanding of nutrition guidelines, daily intake of sodium 1500mg , cholesterol 200mg , calories 30% from fat and 7% or less from saturated fats, daily to have 5 or more servings of fruits and vegetables.  Biometrics: Pre Biometrics - 05/21/19 1220      Pre Biometrics   Height  5\' 4"  (1.626 m)    Weight  126.9 kg    Waist Circumference  46 inches    Hip Circumference  54 inches    Waist to Hip Ratio  0.85 %     BMI (Calculated)  48    Triceps Skinfold  23 mm    % Body Fat  52.6 %    Grip Strength  24.9 kg    Flexibility  0 in    Single Leg Stand  2.57 seconds        Nutrition Therapy Plan and Nutrition  Goals: Nutrition Therapy & Goals - 06/23/19 1440      Personal Nutrition Goals   Comments  We continue to work with our RD to schedule RD classes. Her medficts diet assessment score was 42.  In the intermin, we are providing education through hand-outs. Will continue to monitor.      Intervention Plan   Intervention  Nutrition handout(s) given to patient.       Nutrition Assessments: Nutrition Assessments - 05/21/19 1251      MEDFICTS Scores   Pre Score  42       Nutrition Goals Re-Evaluation:   Nutrition Goals Discharge (Final Nutrition Goals Re-Evaluation):   Psychosocial: Target Goals: Acknowledge presence or absence of significant depression and/or stress, maximize coping skills, provide positive support system. Participant is able to verbalize types and ability to use techniques and skills needed for reducing stress and depression.  Initial Review & Psychosocial Screening: Initial Psych Review & Screening - 05/21/19 1225      Initial Review   Current issues with  None Identified      Family Dynamics   Good Support System?  Yes      Barriers   Psychosocial barriers to participate in program  There are no identifiable barriers or psychosocial needs.      Screening Interventions   Interventions  Encouraged to exercise       Quality of Life Scores: Quality of Life - 05/21/19 1300      Quality of Life   Select  Quality of Life      Quality of Life Scores   Health/Function Pre  20 %    Socioeconomic Pre  20 %    Psych/Spiritual Pre  20.14 %    Family Pre  20 %    GLOBAL Pre  20.03 %      Scores of 19 and below usually indicate a poorer quality of life in these areas.  A difference of  2-3 points is a clinically meaningful difference.  A difference of 2-3  points in the total score of the Quality of Life Index has been associated with significant improvement in overall quality of life, self-image, physical symptoms, and general health in studies assessing change in quality of life.  PHQ-9: Recent Review Flowsheet Data    Depression screen [facility] 2/9 05/21/2019 02/26/2019   Decreased Interest 0 0   Down, Depressed, Hopeless 0 0   PHQ - 2 Score 0 0   Altered sleeping 1 -   Tired, decreased energy 1 -   Change in appetite 0 -   Feeling bad or failure about yourself  0 -   Trouble concentrating 0 -   Moving slowly or fidgety/restless 0 -   Suicidal thoughts 0 -   PHQ-9 Score 2 -   Difficult doing work/chores Somewhat difficult -     Interpretation of Total Score  Total Score Depression Severity:  1-4 = Minimal depression, 5-9 = Mild depression, 10-14 = Moderate depression, 15-19 = Moderately severe depression, 20-27 = Severe depression   Psychosocial Evaluation and Intervention: Psychosocial Evaluation - 05/21/19 1226      Psychosocial Evaluation & Interventions   Interventions  Encouraged to exercise with the program and follow exercise prescription    Continue Psychosocial Services   No Follow up required       Psychosocial Re-Evaluation: Psychosocial Re-Evaluation    [place] Name 06/02/19 1458 06/23/19 1445           Psychosocial Re-Evaluation  Current issues with  None Identified  None Identified      Comments  Patient's initial QOL score was 20.03 and her PHQ-9 score was 2 with no psychosocial issues identified. Will continue to monitor.  Patient's initial QOL score was 20.03 and her PHQ-9 score was 2 with no psychosocial issues identified. Will continue to monitor.      Expected Outcomes  Patient will have no psychosocial issued identified at discharge.  Patient will have no psychosocial issued identified at discharge.      Interventions  Stress management education;Encouraged to attend Cardiac Rehabilitation for the  exercise;Relaxation education  Stress management education;Encouraged to attend Cardiac Rehabilitation for the exercise;Relaxation education      Continue Psychosocial Services   No Follow up required  No Follow up required         Psychosocial Discharge (Final Psychosocial Re-Evaluation): Psychosocial Re-Evaluation - 06/23/19 1445      Psychosocial Re-Evaluation   Current issues with  None Identified    Comments  Patient's initial QOL score was 20.03 and her PHQ-9 score was 2 with no psychosocial issues identified. Will continue to monitor.    Expected Outcomes  Patient will have no psychosocial issued identified at discharge.    Interventions  Stress management education;Encouraged to attend Cardiac Rehabilitation for the exercise;Relaxation education    Continue Psychosocial Services   No Follow up required       Vocational Rehabilitation: Provide vocational rehab assistance to qualifying candidates.   Vocational Rehab Evaluation & Intervention: Vocational Rehab - 05/21/19 1252      Initial Vocational Rehab Evaluation & Intervention   Assessment shows need for Vocational Rehabilitation  No       Education: Education Goals: Education classes will be provided on a weekly basis, covering required topics. Participant will state understanding/return demonstration of topics presented.  Learning Barriers/Preferences: Learning Barriers/Preferences - 05/21/19 1251      Learning Barriers/Preferences   Learning Barriers  None    Learning Preferences  Individual Instruction;Group Instruction;Skilled Demonstration       Education Topics: Hypertension, Hypertension Reduction -Define heart disease and high blood pressure. Discus how high blood pressure affects the body and ways to reduce high blood pressure.   Exercise and Your Heart -Discuss why it is important to exercise, the FITT principles of exercise, normal and abnormal responses to exercise, and how to exercise  safely.   Angina -Discuss definition of angina, causes of angina, treatment of angina, and how to decrease risk of having angina.   Cardiac Medications -Review what the following cardiac medications are used for, how they affect the body, and side effects that may occur when taking the medications.  Medications include Aspirin, Beta blockers, calcium channel blockers, ACE Inhibitors, angiotensin receptor blockers, diuretics, digoxin, and antihyperlipidemics.   CARDIAC REHAB PHASE II EXERCISE from 06/09/2019 in [place]  Date  06/09/19  Educator  Etheleen Mayhew  Instruction Review Code  2- Demonstrated Understanding      Congestive Heart Failure -Discuss the definition of CHF, how to live with CHF, the signs and symptoms of CHF, and how keep track of weight and sodium intake.   Heart Disease and Intimacy -Discus the effect sexual activity has on the heart, how changes occur during intimacy as we age, and safety during sexual activity.   Smoking Cessation / COPD -Discuss different methods to quit smoking, the health benefits of quitting smoking, and the definition of COPD.   Nutrition I: Fats -Discuss the types of  cholesterol, what cholesterol does to the heart, and how cholesterol levels can be controlled.   Nutrition II: Labels -Discuss the different components of food labels and how to read food label   Heart Parts/Heart Disease and PAD -Discuss the anatomy of the heart, the pathway of blood circulation through the heart, and these are affected by heart disease.   Stress I: Signs and Symptoms -Discuss the causes of stress, how stress may lead to anxiety and depression, and ways to limit stress.   Stress II: Relaxation -Discuss different types of relaxation techniques to limit stress.   Warning Signs of Stroke / TIA -Discuss definition of a stroke, what the signs and symptoms are of a stroke, and how to identify when someone is having  stroke.   Knowledge Questionnaire Score: Knowledge Questionnaire Score - 05/21/19 1252      Knowledge Questionnaire Score   Pre Score  23/28       Core Components/Risk Factors/Patient Goals at Admission: Personal Goals and Risk Factors at Admission - 05/21/19 1252      Core Components/Risk Factors/Patient Goals on Admission    Weight Management  Weight Maintenance    Personal Goal Other  Yes    Personal Goal  Keep heart in rhythm, not feeling tired anymore. Live longer.    Intervention  Attend program 3 x week and to supplement with exercise at using home exercise prescription.    Expected Outcomes  To reach expected gaols.       Core Components/Risk Factors/Patient Goals Review:  Goals and Risk Factor Review    Row Name 06/02/19 1456 06/23/19 1441           Core Components/Risk Factors/Patient Goals Review   Personal Goals Review  Weight Management/Obesity Get heart in rhythm; not feel tired; live longer.  Weight Management/Obesity Get heart in rhythm; not feel tired; live longer.      Review  Patient is new to the program completing 2 sessions. Her A-Fib presists and she is scheduled to have a cardioversion tomorrow 06/03/19. Hopefully, she will return to the program after she is cleared to do so. Will continue to monitor.  Patient has completed 7 sessions maintaining her weight since last 30 day review. She is doing well in the program. She has been out for the past 2 weeks due to her work schedule and respiratory symptoms. Her cardioversion was successful and converted her to NSR up until this week. She felt a lot better after the procedure and said she had more energy and less fatigue. She called today and said she was back in AFib. She saw her cardiologist yesterday 5/11 and her ECG showed A-Fib. She said she was open to cardioversion if this was an option. She contributes her fatigue to the A-fib.  Will continue to monitor for progress.      Expected Outcomes  Patient will  continue to attend sessions and complete the program meeting her personal goals.  Patient will continue to attend sessions and complete the program meeting her personal goals.         Core Components/Risk Factors/Patient Goals at Discharge (Final Review):  Goals and Risk Factor Review - 06/23/19 1441      Core Components/Risk Factors/Patient Goals Review   Personal Goals Review  Weight Management/Obesity   Get heart in rhythm; not feel tired; live longer.   Review  Patient has completed 7 sessions maintaining her weight since last 30 day review. She is doing well in the program. She  has been out for the past 2 weeks due to her work schedule and respiratory symptoms. Her cardioversion was successful and converted her to NSR up until this week. She felt a lot better after the procedure and said she had more energy and less fatigue. She called today and said she was back in AFib. She saw her cardiologist yesterday 5/11 and her ECG showed A-Fib. She said she was open to cardioversion if this was an option. She contributes her fatigue to the A-fib.  Will continue to monitor for progress.    Expected Outcomes  Patient will continue to attend sessions and complete the program meeting her personal goals.       ITP Comments: ITP Comments    Row Name 05/21/19 1138           ITP Comments  Patient came for her assessment/orientation visit today. She is eager to get started.          Comments: ITP REVIEW Pt is making expected progress toward Cardiac Rehab goals after completing 7 sessions. Recommend continued exercise, life style modification, education, and increased stamina and strength.

## 2019-06-25 ENCOUNTER — Encounter (HOSPITAL_COMMUNITY): Payer: Medicaid Other

## 2019-06-28 ENCOUNTER — Encounter (HOSPITAL_COMMUNITY): Payer: Medicaid Other

## 2019-06-28 ENCOUNTER — Telehealth: Payer: Self-pay | Admitting: Cardiology

## 2019-06-28 NOTE — Telephone Encounter (Signed)
Please give pt a call concerning visit from 06/22/2019 w/ A. Leonides Sake

## 2019-06-29 NOTE — Addendum Note (Signed)
Addended by: Julian Hy T on: 06/29/2019 09:39 AM   Modules accepted: Orders

## 2019-06-29 NOTE — Telephone Encounter (Signed)
Per recent message from Dr Harl Bowie to Katina Dung, NP regarding recent office appt pt needs referral to afib clinic - spoke with pt who is agreeable - referral placed and will forward to schedulers

## 2019-06-30 ENCOUNTER — Encounter (HOSPITAL_COMMUNITY): Payer: Medicaid Other

## 2019-07-02 ENCOUNTER — Encounter (HOSPITAL_COMMUNITY): Payer: Medicaid Other

## 2019-07-05 ENCOUNTER — Encounter (HOSPITAL_COMMUNITY): Payer: Medicaid Other

## 2019-07-06 ENCOUNTER — Ambulatory Visit (HOSPITAL_COMMUNITY)
Admission: RE | Admit: 2019-07-06 | Discharge: 2019-07-06 | Disposition: A | Discharge status: Home / Self Care | Payer: Medicaid Other | Source: Ambulatory Visit | Attending: Physician Assistant | Admitting: Physician Assistant

## 2019-07-06 ENCOUNTER — Other Ambulatory Visit: Payer: Self-pay

## 2019-07-06 VITALS — BP 118/66 | HR 64 | Ht 64.0 in | Wt 276.4 lb

## 2019-07-06 DIAGNOSIS — F17210 Nicotine dependence, cigarettes, uncomplicated: Secondary | ICD-10-CM | POA: Insufficient documentation

## 2019-07-06 DIAGNOSIS — E785 Hyperlipidemia, unspecified: Secondary | ICD-10-CM | POA: Diagnosis not present

## 2019-07-06 DIAGNOSIS — K219 Gastro-esophageal reflux disease without esophagitis: Secondary | ICD-10-CM | POA: Insufficient documentation

## 2019-07-06 DIAGNOSIS — Z7982 Long term (current) use of aspirin: Secondary | ICD-10-CM | POA: Diagnosis not present

## 2019-07-06 DIAGNOSIS — I10 Essential (primary) hypertension: Secondary | ICD-10-CM | POA: Diagnosis not present

## 2019-07-06 DIAGNOSIS — E119 Type 2 diabetes mellitus without complications: Secondary | ICD-10-CM | POA: Diagnosis not present

## 2019-07-06 DIAGNOSIS — Z96612 Presence of left artificial shoulder joint: Secondary | ICD-10-CM | POA: Insufficient documentation

## 2019-07-06 DIAGNOSIS — I4819 Other persistent atrial fibrillation: Secondary | ICD-10-CM | POA: Diagnosis present

## 2019-07-06 DIAGNOSIS — Z7901 Long term (current) use of anticoagulants: Secondary | ICD-10-CM | POA: Diagnosis not present

## 2019-07-06 DIAGNOSIS — K5790 Diverticulosis of intestine, part unspecified, without perforation or abscess without bleeding: Secondary | ICD-10-CM | POA: Diagnosis not present

## 2019-07-06 DIAGNOSIS — D6869 Other thrombophilia: Secondary | ICD-10-CM

## 2019-07-06 DIAGNOSIS — Z8249 Family history of ischemic heart disease and other diseases of the circulatory system: Secondary | ICD-10-CM | POA: Insufficient documentation

## 2019-07-06 DIAGNOSIS — Z6841 Body Mass Index (BMI) 40.0 and over, adult: Secondary | ICD-10-CM | POA: Insufficient documentation

## 2019-07-06 DIAGNOSIS — Z86718 Personal history of other venous thrombosis and embolism: Secondary | ICD-10-CM | POA: Insufficient documentation

## 2019-07-06 DIAGNOSIS — Z9884 Bariatric surgery status: Secondary | ICD-10-CM | POA: Insufficient documentation

## 2019-07-06 DIAGNOSIS — Z79899 Other long term (current) drug therapy: Secondary | ICD-10-CM | POA: Insufficient documentation

## 2019-07-06 DIAGNOSIS — J45909 Unspecified asthma, uncomplicated: Secondary | ICD-10-CM | POA: Diagnosis not present

## 2019-07-06 DIAGNOSIS — I2510 Atherosclerotic heart disease of native coronary artery without angina pectoris: Secondary | ICD-10-CM | POA: Insufficient documentation

## 2019-07-06 DIAGNOSIS — Z955 Presence of coronary angioplasty implant and graft: Secondary | ICD-10-CM | POA: Insufficient documentation

## 2019-07-06 DIAGNOSIS — R011 Cardiac murmur, unspecified: Secondary | ICD-10-CM | POA: Diagnosis not present

## 2019-07-06 DIAGNOSIS — M1990 Unspecified osteoarthritis, unspecified site: Secondary | ICD-10-CM | POA: Diagnosis not present

## 2019-07-06 DIAGNOSIS — I252 Old myocardial infarction: Secondary | ICD-10-CM | POA: Insufficient documentation

## 2019-07-06 DIAGNOSIS — E669 Obesity, unspecified: Secondary | ICD-10-CM | POA: Diagnosis not present

## 2019-07-06 LAB — CBC
HCT: 39.3 % (ref 36.0–46.0)
Hemoglobin: 12.5 g/dL (ref 12.0–15.0)
MCH: 27.1 pg (ref 26.0–34.0)
MCHC: 31.8 g/dL (ref 30.0–36.0)
MCV: 85.2 fL (ref 80.0–100.0)
Platelets: 347 10*3/uL (ref 150–400)
RBC: 4.61 MIL/uL (ref 3.87–5.11)
RDW: 15.5 % (ref 11.5–15.5)
WBC: 9.6 10*3/uL (ref 4.0–10.5)
nRBC: 0 % (ref 0.0–0.2)

## 2019-07-06 LAB — BASIC METABOLIC PANEL
Anion gap: 12 (ref 5–15)
BUN: 15 mg/dL (ref 8–23)
CO2: 26 mmol/L (ref 22–32)
Calcium: 9.5 mg/dL (ref 8.9–10.3)
Chloride: 98 mmol/L (ref 98–111)
Creatinine, Ser: 0.68 mg/dL (ref 0.44–1.00)
GFR calc Af Amer: >60 mL/min (ref >60)
GFR calc non Af Amer: >60 mL/min (ref >60)
Glucose, Bld: 139 mg/dL — ABNORMAL HIGH (ref 70–99)
Potassium: 3.6 mmol/L (ref 3.5–5.1)
Sodium: 136 mmol/L (ref 135–145)

## 2019-07-06 MED ORDER — MULTAQ 400 MG PO TABS: 400.0000 mg | ORAL_TABLET | Freq: Two times a day (BID) | ORAL | 3 refills | Status: DC

## 2019-07-06 NOTE — Patient Instructions (Signed)
Cardioversion scheduled for Tuesday, June 1st  - Arrive at the [facility] and go to admitting at [place] not eat or drink anything after midnight the night prior to your procedure.  - Take all your morning medication (except diabetic medications) with a sip of water prior to arrival.  - You will not be able to drive home after your procedure.  - Do NOT miss any doses of your blood thinner - if you should miss a dose please notify our office immediately.   On Saturday, May 29th start Multaq 400mg  twice a day WITH FOOD

## 2019-07-06 NOTE — Progress Notes (Signed)
  Primary Care Physician: Fagan, Roy, MD Primary Cardiologist: Dr Branch Primary Electrophysiologist: none Referring Physician: Dr Branch   Amy Tran is a 64 y.o. female with a history of CAD, HTN, HLD, left iliac thrombosis on Xarelto, type 2 diabetes, tobacco abuse, persistent atrial fibrillation (CHA2DS2-VASc score 3), NSTEMI 04/25/2019 (DES to proximal RCA) who presents for consultation in the [facility]. The patient was initially diagnosed with atrial fibrillation in the setting of her NSTEMI. Patient is on Xarelto for a CHADS2VASC score of 3. Patient underwent DCCV on 06/03/19 but unfortunately was back in afib by her follow up on 06/22/19. Patient continues to have symptoms of fatigue despite good rate control. She denies any snoring or significant alcohol use. There were no specific triggers that the patient could identify. She denies bleeding issues on anticoagulation.   Today, she denies symptoms of palpitations, chest pain, shortness of breath, orthopnea, PND, lower extremity edema, dizziness, presyncope, syncope, snoring, daytime somnolence, bleeding, or neurologic sequela. The patient is tolerating medications without difficulties and is otherwise without complaint today.    Atrial Fibrillation Risk Factors:  she does not have symptoms or diagnosis of sleep apnea. she does not have a history of rheumatic fever. she does not have a history of alcohol use. The patient does not have a history of early familial atrial fibrillation or other arrhythmias.  she has a BMI of Body mass index is 47.44 kg/m.. Filed Weights   07/06/19 0925  Weight: 125.4 kg    Family History  Problem Relation Age of Onset  . Diabetes Father   . Hypertension Father   . Parkinson's disease Father   . Cancer Father        not sure what kind  . Hypertension Mother   . Heart disease Mother   . Drug abuse Sister   . Mesothelioma Brother   . Cancer Daughter    non-hodgkins lmphoma  . Hypertension Brother   . Diabetes Brother   . Colon cancer Neg Hx   . Gastric cancer Neg Hx   . Esophageal cancer Neg Hx      Atrial Fibrillation Management history:  Previous antiarrhythmic drugs: none Previous cardioversions: 06/03/19 Previous ablations: none CHADS2VASC score: 3 Anticoagulation history: Xarelto   Past Medical History:  Diagnosis Date  . Arthritis   . Asthma   . Cancer (HCC)    multiple skin cancers  . Diabetes mellitus without complication (HCC)   . Diverticulosis   . Dysrhythmia   . Fluid retention   . GERD (gastroesophageal reflux disease)   . Heart murmur   . High cholesterol   . Hypertension   . Incomplete rotator cuff tear   . Myocardial infarction (HCC)   . PONV (postoperative nausea and vomiting)   . Wears glasses   . Wears partial dentures    bottom   Past Surgical History:  Procedure Laterality Date  . APPENDECTOMY    . CARDIOVERSION N/A 06/03/2019   Procedure: CARDIOVERSION;  Surgeon: Koneswaran, Suresh A, MD;  Location: [facility];  Service: Cardiovascular;  Laterality: N/A;  . CARPAL TUNNEL RELEASE Bilateral 2001   bilateral  . CERVICAL POLYPECTOMY  04/08/2017   Procedure: POLYPECTOMY;  Surgeon: Ferguson, John V, MD;  Location: [facility];  Service: Gynecology;;  Endometrial  . CHOLECYSTECTOMY    . COLONOSCOPY  2007   RMR:Minimal internal hemorrhoids. Diminutive rectal polyp at 10 cm, cold biopsied/removed. The remainder of the rectal mucosa appeared normal Swallow left-sided diverticula. Diminutive   polyp at the splenic flexure cold biopsied/removed (adenomatous)  . COLONOSCOPY N/A 12/28/2013   Procedure: COLONOSCOPY;  Surgeon: Robert M Rourk, MD;  Location: [facility];  Service: Endoscopy;  Laterality: N/A;  730 - moved to 8:30 - Ginger notified pt  . CORONARY STENT INTERVENTION N/A 04/27/2019   Procedure: CORONARY STENT INTERVENTION;  Surgeon: Kelly, Thomas A, MD;  Location: [facility];  Service:  Cardiovascular;  Laterality: N/A;  . DILATION AND CURETTAGE, DIAGNOSTIC / THERAPEUTIC  03/07/2017  . ERCP  2002  . GASTRIC BYPASS  2004  . HYSTEROSCOPY WITH D & C N/A 04/08/2017   Procedure: DILATATION AND CURETTAGE /HYSTEROSCOPY;  Surgeon: Ferguson, John V, MD;  Location: [facility];  Service: Gynecology;  Laterality: N/A;  . INTRAVASCULAR ULTRASOUND/IVUS N/A 09/12/2017   Procedure: INTRAVASCULAR ULTRASOUND/IVUS;  Surgeon: Fields, Charles E, MD;  Location: [facility];  Service: Cardiovascular;  Laterality: N/A;  . LEFT HEART CATH AND CORONARY ANGIOGRAPHY N/A 04/27/2019   Procedure: LEFT HEART CATH AND CORONARY ANGIOGRAPHY;  Surgeon: Kelly, Thomas A, MD;  Location: [facility];  Service: Cardiovascular;  Laterality: N/A;  . NECK SURGERY  1998   fusion  . PERCUTANEOUS VENOUS THROMBECTOMY,LYSIS WITH INTRAVASCULAR ULTRASOUND (IVUS) Left 09/13/2017   Procedure: REMOVAL OF LYSIS CATHETER AND INTRAVASCULAR ULTRASOUND (IVUS) LEFT ILIAC VEIN;  Surgeon: Fields, Charles E, MD;  Location: [facility];  Service: Vascular;  Laterality: Left;  . PERIPHERAL VASCULAR INTERVENTION  09/15/2017   Procedure: PERIPHERAL VASCULAR INTERVENTION;  Surgeon: Cain, Brandon Christopher, MD;  Location: [facility];  Service: Cardiovascular;;  VEINOUS/STENT  . PERIPHERAL VASCULAR THROMBECTOMY Left 09/12/2017   Procedure: PERIPHERAL VASCULAR THROMBECTOMY;  Surgeon: Fields, Charles E, MD;  [facility];  Service: Cardiovascular;  Laterality: Left;  . PERIPHERAL VASCULAR THROMBECTOMY Left 09/15/2017   Procedure: PERIPHERAL VASCULAR THROMBECTOMY;  Surgeon: Cain, Brandon Christopher, MD;  [facility];  Service: Cardiovascular;  Laterality: Left;  IVC TO LT FEM/POP VEIN  . SHOULDER ARTHROSCOPY WITH SUBACROMIAL DECOMPRESSION Left 06/23/2013   Procedure: LEFT SHOULDER ARTHROSCOPY WITH DEBRIDEMENT ROTATOR CUFF AND LABRUM;  Surgeon: Robert A Wainer, MD;  Location: [facility];  Service:  Orthopedics;  Laterality: Left;  . SHOULDER SURGERY  1999   left    Current Outpatient Medications  Medication Sig Dispense Refill  . acetaminophen (TYLENOL) 500 MG tablet Take 1,000 mg by mouth as needed for moderate pain.     . albuterol (VENTOLIN HFA) 108 (90 Base) MCG/ACT inhaler Inhale 2 puffs into the lungs every 6 (six) hours as needed for wheezing or shortness of breath.    . amLODipine (NORVASC) 5 MG tablet Take 5 mg by mouth daily.    . atorvastatin (LIPITOR) 80 MG tablet Take 1 tablet (80 mg total) by mouth daily at 6 PM. (Patient taking differently: Take 80 mg by mouth at bedtime. ) 90 tablet 1  . BD PEN NEEDLE NANO U/F 32G X 4 MM MISC USE FOR VICTOZA INJECTIONS    . cetirizine (ZYRTEC) 10 MG tablet Take 10 mg by mouth as needed for allergies.    . clopidogrel (PLAVIX) 75 MG tablet Take 1 tablet (75 mg total) by mouth daily with breakfast. 90 tablet 3  . Cyanocobalamin (B-12) 5000 MCG CAPS Take 5,000 mcg by mouth daily.    . diphenhydrAMINE (BENADRYL) 2 % cream Apply 1 application topically 3 (three) times daily as needed for itching. For itchy palms    . fluticasone (  FLONASE) 50 MCG/ACT nasal spray Place 2 sprays into both nostrils as needed for allergies.     . losartan (COZAAR) 100 MG tablet Take 100 mg by mouth daily.    . nitroGLYCERIN (NITROSTAT) 0.4 MG SL tablet Place 1 tablet (0.4 mg total) under the tongue every 5 (five) minutes as needed for chest pain. 30 tablet 0  . potassium chloride SA (KLOR-CON) 20 MEQ tablet Take 20 mEq by mouth daily. Takes 3 per day as needed for cramps .    . rivaroxaban (XARELTO) 20 MG TABS tablet Take 1 tablet (20 mg total) by mouth daily with supper. 90 tablet 3  . spironolactone (ALDACTONE) 25 MG tablet Take 25 mg by mouth daily.    . torsemide (DEMADEX) 20 MG tablet Take 60 mg by mouth every morning.    . VICTOZA 18 MG/3ML SOPN Inject 1.8 mg into the skin at bedtime.     . dronedarone (MULTAQ) 400 MG tablet Take 1 tablet (400 mg total) by  mouth 2 (two) times daily with a meal. 60 tablet 3  . nicotine (NICODERM CQ - DOSED IN MG/24 HOURS) 14 mg/24hr patch Place 1 patch (14 mg total) onto the skin daily. (Patient not taking: Reported on 07/06/2019) 14 patch 0  . nicotine (NICODERM CQ - DOSED IN MG/24 HOURS) 21 mg/24hr patch Place 1 patch (21 mg total) onto the skin daily. (Patient not taking: Reported on 06/22/2019) 42 patch 0  . nicotine (NICODERM CQ - DOSED IN MG/24 HR) 7 mg/24hr patch Place 1 patch (7 mg total) onto the skin daily. (Patient not taking: Reported on 06/22/2019) 14 patch 0  . Nicotine 21-14-7 MG/24HR KIT Apply 1 patch topically daily.     No current facility-administered medications for this encounter.    Allergies  Allergen Reactions  . Penicillins Hives and Rash    Has patient had a PCN reaction causing immediate rash, facial/tongue/throat swelling, SOB or lightheadedness with hypotension: Yes Has patient had a PCN reaction causing severe rash involving mucus membranes or skin necrosis: No Has patient had a PCN reaction that required hospitalization No Has patient had a PCN reaction occurring within the last 10 years: No If all of the above answers are "NO", then may proceed with Cephalosporin use.     Social History   Socioeconomic History  . Marital status: Widowed    Spouse name: Not on file  . Number of children: 2  . Years of education: Not on file  . Highest education level: Not on file  Occupational History  . Occupation: works at [employer]    Employer: [employer]   Tobacco Use  . Smoking status: Current Every Day Smoker    Packs/day: 0.50    Years: 29.00    Pack years: 14.50    Types: Cigarettes    Start date: 04/09/1970  . Smokeless tobacco: Never Used  . Tobacco comment: 10 ciggs per day - 06/22/19  Substance and Sexual Activity  . Alcohol use: Not Currently  . Drug use: No  . Sexual activity: Not Currently    Birth control/protection: Post-menopausal  Other Topics Concern  . Not on file    Social History Narrative  . Not on file   Social Determinants of Health   Financial Resource Strain:   . Difficulty of Paying Living Expenses:   Food Insecurity:   . Worried About Running Out of Food in the Last Year:   . Ran Out of Food in the Last Year:     Transportation Needs:   . Film/video editor (Medical):   Marland Kitchen Lack of Transportation (Non-Medical):   Physical Activity:   . Days of Exercise per Week:   . Minutes of Exercise per Session:   Stress:   . Feeling of Stress :   Social Connections:   . Frequency of Communication with Friends and Family:   . Frequency of Social Gatherings with Friends and Family:   . Attends Religious Services:   . Active Member of Clubs or Organizations:   . Attends Archivist Meetings:   Marland Kitchen Marital Status:   Intimate Partner Violence:   . Fear of Current or Ex-Partner:   . Emotionally Abused:   Marland Kitchen Physically Abused:   . Sexually Abused:      ROS- All systems are reviewed and negative except as per the HPI above.  Physical Exam: Vitals:   07/06/19 0925  BP: 118/66  Pulse: 64  Weight: 125.4 kg  Height: _0  (1.626 m)    GEN- The patient is well appearing obese female, alert and oriented x 3 today.   Head- normocephalic, atraumatic Eyes-  Sclera clear, conjunctiva pink Ears- hearing intact Oropharynx- clear Neck- supple  Lungs- Clear to ausculation bilaterally, normal work of breathing Heart- irregular rate and rhythm, no murmurs, rubs or gallops  GI- soft, NT, ND, + BS Extremities- no clubbing, cyanosis, or edema MS- no significant deformity or atrophy Skin- no rash or lesion Psych- euthymic mood, full affect Neuro- strength and sensation are intact  Wt Readings from Last 3 Encounters:  07/06/19 125.4 kg  06/22/19 127.6 kg  06/03/19 126.1 kg    EKG today demonstrates afib HR 64, QRS 88, QTc 435  Echo 04/28/19 demonstrated  1. Left ventricular ejection fraction, by estimation, is 55 to 60%. Left   ventricular ejection fraction by 3D volume is 58 %. The left ventricle has  normal function. The left ventricle has no regional wall motion  abnormalities. The left ventricular  internal cavity size was mildly dilated. There is mild left ventricular  hypertrophy. Left ventricular diastolic parameters are indeterminate.  2. Right ventricular systolic function is normal. The right ventricular  size is mildly enlarged. There is mildly elevated pulmonary artery  systolic pressure. The estimated right ventricular systolic pressure is  70.9 mmHg.  3. The mitral valve is normal in structure. Mild mitral valve  regurgitation.  4. The aortic valve is tricuspid. Aortic valve regurgitation is not  visualized. Mild aortic stenosis. Vmax 2.8 m/s, MG 72mHg, AVA 1.4 cm^2,  DI 0.55  5. Aortic dilatation noted. There is mild dilatation of the ascending  aorta measuring 36 mm.  6. The inferior vena cava is dilated in size with <50% respiratory  variability, suggesting right atrial pressure of 15 mmHg.   Epic records are reviewed at length today  CHA2DS2-VASc Score = 3  The patient's score is based upon: CHF History: 0 HTN History: 1 Age : 0 Diabetes History: 0 Stroke History: 0 Vascular Disease History: 1 Gender: 1      ASSESSMENT AND PLAN: 1. Persistent Atrial Fibrillation (ICD10:  I48.19) The patient's CHA2DS2-VASc score is 3, indicating a 3.2% annual risk of stroke.   General education about afib provided and questions answered.  We discussed therapeutic options today including AAD +/- DCCV.  Will arrange repeat DCCV. Check bmet/CBC today. Will plan to start Multaq 400 mg BID 3 days prior to DCCV. Continue Xarelto 20 mg daily. Patient denies any missed doses in the last 3  weeks.  2. Secondary Hypercoagulable State (ICD10:  D68.69) The patient is at significant risk for stroke/thromboembolism based upon her CHA2DS2-VASc Score of 3.  Continue Rivaroxaban (Xarelto).   3.  Obesity Body mass index is 47.44 kg/m. Lifestyle modification was discussed at length including regular exercise and weight reduction.  4. CAD S/p PCI to RCA 04/27/19. No anginal symptoms  5. HTN Stable, no changes today.   Follow up in the AF clinic one week post DCCV.   [facility] [address] [place], [place] [address] [phone] 07/06/2019 10:49 AM

## 2019-07-06 NOTE — H&P (View-Only) (Signed)
Primary Care Physician: Asencion Noble, MD Primary Cardiologist: Dr Harl Bowie Primary Electrophysiologist: none Referring Physician: Dr Serita Kyle is a 64 y.o. female with a history of CAD, HTN, HLD, left iliac thrombosis on Xarelto, type 2 diabetes, tobacco abuse, persistent atrial fibrillation (CHA2DS2-VASc score 3), NSTEMI 04/25/2019 (DES to proximal RCA) who presents for consultation in the [facility]. The patient was initially diagnosed with atrial fibrillation in the setting of her NSTEMI. Patient is on Xarelto for a CHADS2VASC score of 3. Patient underwent DCCV on 06/03/19 but unfortunately was back in afib by her follow up on 06/22/19. Patient continues to have symptoms of fatigue despite good rate control. She denies any snoring or significant alcohol use. There were no specific triggers that the patient could identify. She denies bleeding issues on anticoagulation.   Today, she denies symptoms of palpitations, chest pain, shortness of breath, orthopnea, PND, lower extremity edema, dizziness, presyncope, syncope, snoring, daytime somnolence, bleeding, or neurologic sequela. The patient is tolerating medications without difficulties and is otherwise without complaint today.    Atrial Fibrillation Risk Factors:  she does not have symptoms or diagnosis of sleep apnea. she does not have a history of rheumatic fever. she does not have a history of alcohol use. The patient does not have a history of early familial atrial fibrillation or other arrhythmias.  she has a BMI of Body mass index is 47.44 kg/m.Marland Kitchen Filed Weights   07/06/19 0925  Weight: 125.4 kg    Family History  Problem Relation Age of Onset  . Diabetes Father   . Hypertension Father   . Parkinson's disease Father   . Cancer Father        not sure what kind  . Hypertension Mother   . Heart disease Mother   . Drug abuse Sister   . Mesothelioma Brother   . Cancer Daughter    non-hodgkins lmphoma  . Hypertension Brother   . Diabetes Brother   . Colon cancer Neg Hx   . Gastric cancer Neg Hx   . Esophageal cancer Neg Hx      Atrial Fibrillation Management history:  Previous antiarrhythmic drugs: none Previous cardioversions: 06/03/19 Previous ablations: none CHADS2VASC score: 3 Anticoagulation history: Xarelto   Past Medical History:  Diagnosis Date  . Arthritis   . Asthma   . Cancer [facility])    multiple skin cancers  . Diabetes mellitus without complication ([place])   . Diverticulosis   . Dysrhythmia   . Fluid retention   . GERD (gastroesophageal reflux disease)   . Heart murmur   . High cholesterol   . Hypertension   . Incomplete rotator cuff tear   . Myocardial infarction ([place])   . PONV (postoperative nausea and vomiting)   . Wears glasses   . Wears partial dentures    bottom   Past Surgical History:  Procedure Laterality Date  . APPENDECTOMY    . CARDIOVERSION N/A 06/03/2019   Procedure: CARDIOVERSION;  Surgeon: Herminio Commons, MD;  Location: [facility] ORS;  Service: Cardiovascular;  Laterality: N/A;  . CARPAL TUNNEL RELEASE Bilateral 2001   bilateral  . CERVICAL POLYPECTOMY  04/08/2017   Procedure: POLYPECTOMY;  Surgeon: [name], MD;  Location: [facility] ORS;  Service: Gynecology;;  Endometrial  . CHOLECYSTECTOMY    . COLONOSCOPY  2007   [id number] internal hemorrhoids. Diminutive rectal polyp at 10 cm, cold biopsied/removed. The remainder of the rectal mucosa appeared normal Swallow left-sided diverticula. Diminutive  polyp at the splenic flexure cold biopsied/removed (adenomatous)  . COLONOSCOPY N/A 12/28/2013   Procedure: COLONOSCOPY;  Surgeon: Daneil Dolin, MD;  Location: [facility] END[facility] SU[facility];  Service: Endoscopy;  Laterality: N/A;  730 - moved to 8:30 - Ginger notified pt  . CORONARY STENT INTERVENTION N/A 04/27/2019   Procedure: CORONARY STENT INTERVENTION;  Surgeon: Troy Sine, MD;  Location: [place] [facility] LAB;  Service:  Cardiovascular;  Laterality: N/A;  . DILATION AND CURETTAGE, DIAGNOSTIC / THERAPEUTIC  03/07/2017  . ERCP  2002  . GASTRIC BYPASS  2004  . HYSTEROSCOPY WITH D & C N/A 04/08/2017   Procedure: DILATATION AND CURETTAGE /HYSTEROSCOPY;  Surgeon: [name], MD;  Location: [facility];  Service: Gynecology;  Laterality: N/A;  . INTRAVASCULAR ULTRASOUND/IVUS N/A 09/12/2017   Procedure: INTRAVASCULAR ULTRASOUND/IVUS;  Surgeon: Elam Dutch, MD;  Location: [place] [facility];  Service: Cardiovascular;  Laterality: N/A;  . LEFT HEART CATH AND CORONARY ANGIOGRAPHY N/A 04/27/2019   Procedure: LEFT HEART CATH AND CORONARY ANGIOGRAPHY;  Surgeon: Troy Sine, MD;  Location: [place] [facility];  Service: Cardiovascular;  Laterality: N/A;  . NECK SURGERY  1998   fusion  . PERCUTANEOUS VENOUS THROMBECTOMY,LYSIS WITH INTRAVASCULAR ULTRASOUND (IVUS) Left 09/13/2017   Procedure: REMOVAL OF LYSIS CATHETER AND INTRAVASCULAR ULTRASOUND (IVUS) LEFT ILIAC VEIN;  Surgeon: Elam Dutch, MD;  Location: [place];  Service: Vascular;  Laterality: Left;  . PERIPHERAL VASCULAR INTERVENTION  09/15/2017   Procedure: PERIPHERAL VASCULAR INTERVENTION;  Surgeon: Waynetta Sandy, MD;  Location: [place] [facility];  Service: Cardiovascular;;  VEINOUS/STENT  . PERIPHERAL VASCULAR THROMBECTOMY Left 09/12/2017   Procedure: PERIPHERAL VASCULAR THROMBECTOMY;  Surgeon: Elam Dutch, MD;  Location: [place] [facility];  Service: Cardiovascular;  Laterality: Left;  . PERIPHERAL VASCULAR THROMBECTOMY Left 09/15/2017   Procedure: PERIPHERAL VASCULAR THROMBECTOMY;  Surgeon: Waynetta Sandy, MD;  Location: [place] [facility];  Service: Cardiovascular;  Laterality: Left;  IVC TO LT FEM/POP VEIN  . SHOULDER ARTHROSCOPY WITH SUBACROMIAL DECOMPRESSION Left 06/23/2013   Procedure: LEFT SHOULDER ARTHROSCOPY WITH DEBRIDEMENT ROTATOR CUFF AND LABRUM;  Surgeon: Lorn Junes, MD;  Location: [place];  Service:  Orthopedics;  Laterality: Left;  . SHOULDER SURGERY  1999   left    Current Outpatient Medications  Medication Sig Dispense Refill  . acetaminophen (TYLENOL) 500 MG tablet Take 1,000 mg by mouth as needed for moderate pain.     Marland Kitchen albuterol (VENTOLIN HFA) 108 (90 Base) MCG/ACT inhaler Inhale 2 puffs into the lungs every 6 (six) hours as needed for wheezing or shortness of breath.    Marland Kitchen amLODipine (NORVASC) 5 MG tablet Take 5 mg by mouth daily.    Marland Kitchen atorvastatin (LIPITOR) 80 MG tablet Take 1 tablet (80 mg total) by mouth daily at 6 PM. (Patient taking differently: Take 80 mg by mouth at bedtime. ) 90 tablet 1  . BD PEN NEEDLE NANO U/F 32G X 4 MM MISC USE FOR VICTOZA INJECTIONS    . cetirizine (ZYRTEC) 10 MG tablet Take 10 mg by mouth as needed for allergies.    Marland Kitchen clopidogrel (PLAVIX) 75 MG tablet Take 1 tablet (75 mg total) by mouth daily with breakfast. 90 tablet 3  . Cyanocobalamin (B-12) 5000 MCG CAPS Take 5,000 mcg by mouth daily.    . diphenhydrAMINE (BENADRYL) 2 % cream Apply 1 application topically 3 (three) times daily as needed for itching. For itchy palms    . fluticasone (  FLONASE) 50 MCG/ACT nasal spray Place 2 sprays into both nostrils as needed for allergies.     Marland Kitchen losartan (COZAAR) 100 MG tablet Take 100 mg by mouth daily.    . nitroGLYCERIN (NITROSTAT) 0.4 MG SL tablet Place 1 tablet (0.4 mg total) under the tongue every 5 (five) minutes as needed for chest pain. 30 tablet 0  . potassium chloride SA (KLOR-CON) 20 MEQ tablet Take 20 mEq by mouth daily. Takes 3 per day as needed for cramps .    . rivaroxaban (XARELTO) 20 MG TABS tablet Take 1 tablet (20 mg total) by mouth daily with supper. 90 tablet 3  . spironolactone (ALDACTONE) 25 MG tablet Take 25 mg by mouth daily.    Marland Kitchen torsemide (DEMADEX) 20 MG tablet Take 60 mg by mouth every morning.    Marland Kitchen VICTOZA 18 MG/3ML SOPN Inject 1.8 mg into the skin at bedtime.     . dronedarone (MULTAQ) 400 MG tablet Take 1 tablet (400 mg total) by  mouth 2 (two) times daily with a meal. 60 tablet 3  . nicotine (NICODERM CQ - DOSED IN MG/24 HOURS) 14 mg/24hr patch Place 1 patch (14 mg total) onto the skin daily. (Patient not taking: Reported on 07/06/2019) 14 patch 0  . nicotine (NICODERM CQ - DOSED IN MG/24 HOURS) 21 mg/24hr patch Place 1 patch (21 mg total) onto the skin daily. (Patient not taking: Reported on 06/22/2019) 42 patch 0  . nicotine (NICODERM CQ - DOSED IN MG/24 HR) 7 mg/24hr patch Place 1 patch (7 mg total) onto the skin daily. (Patient not taking: Reported on 06/22/2019) 14 patch 0  . Nicotine 21-14-7 MG/24HR KIT Apply 1 patch topically daily.     No current facility-administered medications for this encounter.    Allergies  Allergen Reactions  . Penicillins Hives and Rash    Has patient had a PCN reaction causing immediate rash, facial/tongue/throat swelling, SOB or lightheadedness with hypotension: Yes Has patient had a PCN reaction causing severe rash involving mucus membranes or skin necrosis: No Has patient had a PCN reaction that required hospitalization No Has patient had a PCN reaction occurring within the last 10 years: No If all of the above answers are "NO", then may proceed with Cephalosporin use.     Social History   Socioeconomic History  . Marital status: Widowed    Spouse name: Not on file  . Number of children: 2  . Years of education: Not on file  . Highest education level: Not on file  Occupational History  . Occupation: works at [employer]: [employer]   Tobacco Use  . Smoking status: Current Every Day Smoker    Packs/day: 0.50    Years: 29.00    Pack years: 14.50    Types: Cigarettes    Start date: 04/09/1970  . Smokeless tobacco: Never Used  . Tobacco comment: 10 ciggs per day - 06/22/19  Substance and Sexual Activity  . Alcohol use: Not Currently  . Drug use: No  . Sexual activity: Not Currently    Birth control/protection: Post-menopausal  Other Topics Concern  . Not on file    Social History Narrative  . Not on file   Social Determinants of Health   Financial Resource Strain:   . Difficulty of Paying Living Expenses:   Food Insecurity:   . Worried About Charity fundraiser in the Last Year:   . [place] in the Last Year:  Transportation Needs:   . Film/video editor (Medical):   Marland Kitchen Lack of Transportation (Non-Medical):   Physical Activity:   . Days of Exercise per Week:   . Minutes of Exercise per Session:   Stress:   . Feeling of Stress :   Social Connections:   . Frequency of Communication with Friends and Family:   . Frequency of Social Gatherings with Friends and Family:   . Attends Religious Services:   . Active Member of Clubs or Organizations:   . Attends Archivist Meetings:   Marland Kitchen Marital Status:   Intimate Partner Violence:   . Fear of Current or Ex-Partner:   . Emotionally Abused:   Marland Kitchen Physically Abused:   . Sexually Abused:      ROS- All systems are reviewed and negative except as per the HPI above.  Physical Exam: Vitals:   07/06/19 0925  BP: 118/66  Pulse: 64  Weight: 125.4 kg  Height: _0  (1.626 m)    GEN- The patient is well appearing obese female, alert and oriented x 3 today.   Head- normocephalic, atraumatic Eyes-  Sclera clear, conjunctiva pink Ears- hearing intact Oropharynx- clear Neck- supple  Lungs- Clear to ausculation bilaterally, normal work of breathing Heart- irregular rate and rhythm, no murmurs, rubs or gallops  GI- soft, NT, ND, + BS Extremities- no clubbing, cyanosis, or edema MS- no significant deformity or atrophy Skin- no rash or lesion Psych- euthymic mood, full affect Neuro- strength and sensation are intact  Wt Readings from Last 3 Encounters:  07/06/19 125.4 kg  06/22/19 127.6 kg  06/03/19 126.1 kg    EKG today demonstrates afib HR 64, QRS 88, QTc 435  Echo 04/28/19 demonstrated  1. Left ventricular ejection fraction, by estimation, is 55 to 60%. Left   ventricular ejection fraction by 3D volume is 58 %. The left ventricle has  normal function. The left ventricle has no regional wall motion  abnormalities. The left ventricular  internal cavity size was mildly dilated. There is mild left ventricular  hypertrophy. Left ventricular diastolic parameters are indeterminate.  2. Right ventricular systolic function is normal. The right ventricular  size is mildly enlarged. There is mildly elevated pulmonary artery  systolic pressure. The estimated right ventricular systolic pressure is  70.9 mmHg.  3. The mitral valve is normal in structure. Mild mitral valve  regurgitation.  4. The aortic valve is tricuspid. Aortic valve regurgitation is not  visualized. Mild aortic stenosis. Vmax 2.8 m/s, MG 72mHg, AVA 1.4 cm^2,  DI 0.55  5. Aortic dilatation noted. There is mild dilatation of the ascending  aorta measuring 36 mm.  6. The inferior vena cava is dilated in size with <50% respiratory  variability, suggesting right atrial pressure of 15 mmHg.   Epic records are reviewed at length today  CHA2DS2-VASc Score = 3  The patient's score is based upon: CHF History: 0 HTN History: 1 Age : 0 Diabetes History: 0 Stroke History: 0 Vascular Disease History: 1 Gender: 1      ASSESSMENT AND PLAN: 1. Persistent Atrial Fibrillation (ICD10:  I48.19) The patient's CHA2DS2-VASc score is 3, indicating a 3.2% annual risk of stroke.   General education about afib provided and questions answered.  We discussed therapeutic options today including AAD +/- DCCV.  Will arrange repeat DCCV. Check bmet/CBC today. Will plan to start Multaq 400 mg BID 3 days prior to DCCV. Continue Xarelto 20 mg daily. Patient denies any missed doses in the last 3  weeks.  2. Secondary Hypercoagulable State (ICD10:  D68.69) The patient is at significant risk for stroke/thromboembolism based upon her CHA2DS2-VASc Score of 3.  Continue Rivaroxaban (Xarelto).   3.  Obesity Body mass index is 47.44 kg/m. Lifestyle modification was discussed at length including regular exercise and weight reduction.  4. CAD S/p PCI to RCA 04/27/19. No anginal symptoms  5. HTN Stable, no changes today.   Follow up in the AF clinic one week post DCCV.   [facility] [address] [place], [place] [address] [phone] 07/06/2019 10:49 AM

## 2019-07-07 ENCOUNTER — Encounter (HOSPITAL_COMMUNITY): Payer: Medicaid Other

## 2019-07-08 ENCOUNTER — Ambulatory Visit: Payer: Medicaid Other | Admitting: Student

## 2019-07-09 ENCOUNTER — Other Ambulatory Visit: Payer: Self-pay

## 2019-07-09 ENCOUNTER — Telehealth (HOSPITAL_COMMUNITY): Payer: Self-pay

## 2019-07-09 ENCOUNTER — Other Ambulatory Visit (HOSPITAL_COMMUNITY)
Admission: RE | Admit: 2019-07-09 | Discharge: 2019-07-09 | Disposition: A | Discharge status: Home / Self Care | Payer: Medicaid Other | Source: Ambulatory Visit | Attending: Cardiovascular Disease | Admitting: Cardiovascular Disease

## 2019-07-09 ENCOUNTER — Encounter (HOSPITAL_COMMUNITY): Payer: Medicaid Other

## 2019-07-09 DIAGNOSIS — Z20822 Contact with and (suspected) exposure to covid-19: Secondary | ICD-10-CM | POA: Diagnosis not present

## 2019-07-09 DIAGNOSIS — Z01812 Encounter for preprocedural laboratory examination: Secondary | ICD-10-CM | POA: Diagnosis present

## 2019-07-09 LAB — SARS CORONAVIRUS 2 (TAT 6-24 HRS): SARS Coronavirus 2: NEGATIVE

## 2019-07-09 NOTE — Progress Notes (Signed)
Discharge Progress Report  Patient Details  Name: Amy Tran MRN: [id number] Date of Birth: Dec 22, 1955 Referring Provider:     [place] from 05/21/2019 in [place]  Referring Provider  Branch       Number of Visits: 7  Reason for Discharge:  Early Exit:  Back to work  Smoking History:  Social History   Tobacco Use  Smoking Status Current Every Day Smoker  . Packs/day: 0.50  . Years: 29.00  . Pack years: 14.50  . Types: Cigarettes  . Start date: 04/09/1970  Smokeless Tobacco Never Used  Tobacco Comment   10 ciggs per day - 06/22/19    Diagnosis:  NSTEMI (non-ST elevated myocardial infarction) ([place])  Status post coronary artery stent placement  ADL [facility]:   Initial Exercise Prescription: Initial Exercise Prescription - 05/21/19 1200      Date of Initial Exercise RX and Referring Provider   Date  05/21/19    Referring Provider  Branch    Expected Discharge Date  08/20/19      Arm Ergometer   Level  1    Watts  10    RPM  40    Minutes  17    METs  1.6      T5 Nustep   Level  1    SPM  48    Minutes  22    METs  2.9      Prescription Details   Frequency (times per week)  3    Duration  Progress to 30 minutes of continuous aerobic without signs/symptoms of physical distress      Intensity   THRR 40-80% of Max Heartrate  100-119-137    Ratings of Perceived Exertion  11-13    Perceived Dyspnea  0-4      Progression   Progression  Continue to progress workloads to maintain intensity without signs/symptoms of physical distress.      Resistance Training   Training Prescription  Yes    Weight  1    Reps  10-15       Discharge Exercise Prescription (Final Exercise Prescription Changes):   Functional Capacity: 6 Minute Walk    Row Name 05/21/19 1155         6 Minute Walk   Phase  Initial     Distance  600 feet     Walk Time  6 minutes     # of Rest Breaks  1     MPH  1.13     METS   1.87     RPE  10     Perceived Dyspnea   10     VO2 Peak  2.79     Symptoms  Yes (comment)     Comments  (L) leg became heavy during walk test due to PVD.     Resting HR  63 bpm     Resting BP  160/92     Resting Oxygen Saturation   100 %     Exercise Oxygen Saturation  during 6 min walk  100 %     Max Ex. HR  81 bpm     Max Ex. BP  150/82     2 Minute Post BP  142/82        Psychological, QOL, Others - Outcomes: PHQ 2/9: Depression screen [facility] 2/9 05/21/2019 02/26/2019  Decreased Interest 0 0  Down, Depressed, Hopeless 0 0  PHQ - 2 Score 0 0  Altered sleeping 1 -  Tired, decreased energy 1 -  Change in appetite 0 -  Feeling bad or failure about yourself  0 -  Trouble concentrating 0 -  Moving slowly or fidgety/restless 0 -  Suicidal thoughts 0 -  PHQ-9 Score 2 -  Difficult doing work/chores Somewhat difficult -    Quality of Life: Quality of Life - 05/21/19 1300      Quality of Life   Select  Quality of Life      Quality of Life Scores   Health/Function Pre  20 %    Socioeconomic Pre  20 %    Psych/Spiritual Pre  20.14 %    Family Pre  20 %    GLOBAL Pre  20.03 %       Personal Goals: Goals established at orientation with interventions provided to work toward goal. Personal Goals and Risk Factors at Admission - 05/21/19 1252      Core Components/Risk Factors/Patient Goals on Admission    Weight Management  Weight Maintenance    Personal Goal Other  Yes    Personal Goal  Keep heart in rhythm, not feeling tired anymore. Live longer.    Intervention  Attend program 3 x week and to supplement with exercise at using home exercise prescription.    Expected Outcomes  To reach expected gaols.        Personal Goals Discharge: Goals and Risk Factor Review    Row Name 06/02/19 1456 06/23/19 1441           Core Components/Risk Factors/Patient Goals Review   Personal Goals Review  Weight Management/Obesity Get heart in rhythm; not feel tired; live longer.  Weight  Management/Obesity Get heart in rhythm; not feel tired; live longer.      Review  Patient is new to the program completing 2 sessions. Her A-Fib presists and she is scheduled to have a cardioversion tomorrow 06/03/19. Hopefully, she will return to the program after she is cleared to do so. Will continue to monitor.  Patient has completed 7 sessions maintaining her weight since last 30 day review. She is doing well in the program. She has been out for the past 2 weeks due to her work schedule and respiratory symptoms. Her cardioversion was successful and converted her to NSR up until this week. She felt a lot better after the procedure and said she had more energy and less fatigue. She called today and said she was back in AFib. She saw her cardiologist yesterday 5/11 and her ECG showed A-Fib. She said she was open to cardioversion if this was an option. She contributes her fatigue to the A-fib.  Will continue to monitor for progress.      Expected Outcomes  Patient will continue to attend sessions and complete the program meeting her personal goals.  Patient will continue to attend sessions and complete the program meeting her personal goals.         Exercise Goals and Review: Exercise Goals    Row Name 05/21/19 1216             Exercise Goals   Increase Physical Activity  Yes       Intervention  Provide advice, education, support and counseling about physical activity/exercise needs.;Develop an individualized exercise prescription for aerobic and resistive training based on initial evaluation findings, risk stratification, comorbidities and participant's personal goals.       Expected Outcomes  Short Term: Attend rehab on a regular basis to increase  amount of physical activity.;Long Term: Add in home exercise to make exercise part of routine and to increase amount of physical activity.       Increase Strength and Stamina  Yes       Intervention  Provide advice, education, support and counseling  about physical activity/exercise needs.;Develop an individualized exercise prescription for aerobic and resistive training based on initial evaluation findings, risk stratification, comorbidities and participant's personal goals.       Expected Outcomes  Short Term: Perform resistance training exercises routinely during rehab and add in resistance training at home;Long Term: Improve cardiorespiratory fitness, muscular endurance and strength as measured by increased METs and functional capacity (6MWT)       Able to understand and use rate of perceived exertion (RPE) scale  Yes       Intervention  Provide education and explanation on how to use RPE scale       Expected Outcomes  Short Term: Able to use RPE daily in rehab to express subjective intensity level;Long Term:  Able to use RPE to guide intensity level when exercising independently       Able to understand and use Dyspnea scale  Yes       Intervention  Provide education and explanation on how to use Dyspnea scale       Expected Outcomes  Short Term: Able to use Dyspnea scale daily in rehab to express subjective sense of shortness of breath during exertion;Long Term: Able to use Dyspnea scale to guide intensity level when exercising independently       Knowledge and understanding of Target Heart Rate Range (THRR)  Yes       Intervention  Provide education and explanation of THRR including how the numbers were predicted and where they are located for reference       Expected Outcomes  Short Term: Able to use daily as guideline for intensity in rehab;Long Term: Able to use THRR to govern intensity when exercising independently       Able to check pulse independently  Yes       Intervention  Provide education and demonstration on how to check pulse in carotid and radial arteries.;Review the importance of being able to check your own pulse for safety during independent exercise       Expected Outcomes  Short Term: Able to explain why pulse checking is  important during independent exercise;Long Term: Able to check pulse independently and accurately       Understanding of Exercise Prescription  Yes       Intervention  Provide education, explanation, and written materials on patient's individual exercise prescription       Expected Outcomes  Short Term: Able to explain program exercise prescription;Long Term: Able to explain home exercise prescription to exercise independently          Exercise Goals Re-Evaluation:   Nutrition & Weight - Outcomes: Pre Biometrics - 05/21/19 1220      Pre Biometrics   Height  5\' 4"  (1.626 m)    Weight  126.9 kg    Waist Circumference  46 inches    Hip Circumference  54 inches    Waist to Hip Ratio  0.85 %    BMI (Calculated)  48    Triceps Skinfold  23 mm    % Body Fat  52.6 %    Grip Strength  24.9 kg    Flexibility  0 in    Single Leg Stand  2.57 seconds  Nutrition: Nutrition Therapy & Goals - 06/23/19 1440      Personal Nutrition Goals   Comments  We continue to work with our RD to schedule RD classes. Her medficts diet assessment score was 42.  In the intermin, we are providing education through hand-outs. Will continue to monitor.      Intervention Plan   Intervention  Nutrition handout(s) given to patient.       Nutrition Discharge: Nutrition Assessments - 05/21/19 1251      MEDFICTS Scores   Pre Score  42       Education Questionnaire Score: Knowledge Questionnaire Score - 05/21/19 1252      Knowledge Questionnaire Score   Pre Score  23/28       Patient stopped coming to Cardiac Rehab on 06/14/2019 due to returning to work and going back into A-Fib. She completed 7 sessions. Called patient to remind them of the Cardiac Rehab policy that if they do not call or come for 3 consecutive visits that they would be discharged from the CR program. Letter of discharge was mailed. Doctor will be informed.

## 2019-07-09 NOTE — Telephone Encounter (Signed)
Patient's Multaq has been approved by Medicaid starting 07/09/2019 through 07/03/2020. PA # [id number] Notified patient.

## 2019-07-12 ENCOUNTER — Encounter (HOSPITAL_COMMUNITY): Payer: Medicaid Other

## 2019-07-13 ENCOUNTER — Ambulatory Visit (HOSPITAL_COMMUNITY)
Admission: RE | Admit: 2019-07-13 | Discharge: 2019-07-13 | Disposition: A | Discharge status: Home / Self Care | Payer: Medicaid Other | Attending: Cardiovascular Disease | Admitting: Cardiovascular Disease

## 2019-07-13 ENCOUNTER — Encounter (HOSPITAL_COMMUNITY): Payer: Self-pay | Admitting: Cardiovascular Disease

## 2019-07-13 ENCOUNTER — Ambulatory Visit (HOSPITAL_COMMUNITY): Payer: Medicaid Other | Admitting: Certified Registered"

## 2019-07-13 ENCOUNTER — Encounter (HOSPITAL_COMMUNITY)
Admission: RE | Disposition: A | Discharge status: Home / Self Care | Payer: Self-pay | Source: Home / Self Care | Attending: Cardiovascular Disease

## 2019-07-13 DIAGNOSIS — I4891 Unspecified atrial fibrillation: Secondary | ICD-10-CM | POA: Diagnosis not present

## 2019-07-13 DIAGNOSIS — E785 Hyperlipidemia, unspecified: Secondary | ICD-10-CM | POA: Diagnosis not present

## 2019-07-13 DIAGNOSIS — Z880 Allergy status to penicillin: Secondary | ICD-10-CM | POA: Diagnosis not present

## 2019-07-13 DIAGNOSIS — I10 Essential (primary) hypertension: Secondary | ICD-10-CM | POA: Insufficient documentation

## 2019-07-13 DIAGNOSIS — Z7901 Long term (current) use of anticoagulants: Secondary | ICD-10-CM | POA: Diagnosis not present

## 2019-07-13 DIAGNOSIS — E7800 Pure hypercholesterolemia, unspecified: Secondary | ICD-10-CM | POA: Diagnosis not present

## 2019-07-13 DIAGNOSIS — E669 Obesity, unspecified: Secondary | ICD-10-CM | POA: Diagnosis not present

## 2019-07-13 DIAGNOSIS — Z955 Presence of coronary angioplasty implant and graft: Secondary | ICD-10-CM | POA: Insufficient documentation

## 2019-07-13 DIAGNOSIS — I2510 Atherosclerotic heart disease of native coronary artery without angina pectoris: Secondary | ICD-10-CM | POA: Insufficient documentation

## 2019-07-13 DIAGNOSIS — Z6841 Body Mass Index (BMI) 40.0 and over, adult: Secondary | ICD-10-CM | POA: Insufficient documentation

## 2019-07-13 DIAGNOSIS — I4819 Other persistent atrial fibrillation: Secondary | ICD-10-CM | POA: Diagnosis not present

## 2019-07-13 DIAGNOSIS — I252 Old myocardial infarction: Secondary | ICD-10-CM | POA: Diagnosis not present

## 2019-07-13 DIAGNOSIS — K219 Gastro-esophageal reflux disease without esophagitis: Secondary | ICD-10-CM | POA: Insufficient documentation

## 2019-07-13 DIAGNOSIS — Z7902 Long term (current) use of antithrombotics/antiplatelets: Secondary | ICD-10-CM | POA: Insufficient documentation

## 2019-07-13 DIAGNOSIS — Z794 Long term (current) use of insulin: Secondary | ICD-10-CM | POA: Diagnosis not present

## 2019-07-13 DIAGNOSIS — Z79899 Other long term (current) drug therapy: Secondary | ICD-10-CM | POA: Diagnosis not present

## 2019-07-13 DIAGNOSIS — F17210 Nicotine dependence, cigarettes, uncomplicated: Secondary | ICD-10-CM | POA: Diagnosis not present

## 2019-07-13 DIAGNOSIS — Z86718 Personal history of other venous thrombosis and embolism: Secondary | ICD-10-CM | POA: Insufficient documentation

## 2019-07-13 DIAGNOSIS — E119 Type 2 diabetes mellitus without complications: Secondary | ICD-10-CM | POA: Insufficient documentation

## 2019-07-13 HISTORY — PX: CARDIOVERSION: SHX1299

## 2019-07-13 LAB — GLUCOSE, CAPILLARY: Glucose-Capillary: 109 mg/dL — ABNORMAL HIGH (ref 70–99)

## 2019-07-13 SURGERY — CARDIOVERSION
Anesthesia: General

## 2019-07-13 MED ORDER — SODIUM CHLORIDE 0.9 % IV SOLN
INTRAVENOUS | Status: DC
  Administered 2019-07-13: 500 mL via INTRAVENOUS

## 2019-07-13 MED ORDER — PROPOFOL 10 MG/ML IV BOLUS
INTRAVENOUS | Status: DC | PRN
  Administered 2019-07-13: 80 mg via INTRAVENOUS

## 2019-07-13 MED ORDER — LIDOCAINE 2% (20 MG/ML) 5 ML SYRINGE
INTRAMUSCULAR | Status: DC | PRN
  Administered 2019-07-13: 40 mg via INTRAVENOUS

## 2019-07-13 NOTE — Discharge Instructions (Signed)
Electrical Cardioversion Electrical cardioversion is the delivery of a jolt of electricity to restore a normal rhythm to the heart. A rhythm that is too fast or is not regular keeps the heart from pumping well. In this procedure, sticky patches or metal paddles are placed on the chest to deliver electricity to the heart from a device. This procedure may be done in an emergency if:  There is low or no blood pressure as a result of the heart rhythm.  Normal rhythm must be restored as fast as possible to protect the brain and heart from further damage.  It may save a life. This may also be a scheduled procedure for irregular or fast heart rhythms that are not immediately life-threatening. Tell a health care provider about:  Any allergies you have.  All medicines you are taking, including vitamins, herbs, eye drops, creams, and over-the-counter medicines.  Any problems you or family members have had with anesthetic medicines.  Any blood disorders you have.  Any surgeries you have had.  Any medical conditions you have.  Whether you are pregnant or may be pregnant. What are the risks? Generally, this is a safe procedure. However, problems may occur, including:  Allergic reactions to medicines.  A blood clot that breaks free and travels to other parts of your body.  The possible return of an abnormal heart rhythm within hours or days after the procedure.  Your heart stopping (cardiac arrest). This is rare. What happens before the procedure? Medicines  Your health care provider may have you start taking: ? Blood-thinning medicines (anticoagulants) so your blood does not clot as easily. ? Medicines to help stabilize your heart rate and rhythm.  Ask your health care provider about: ? Changing or stopping your regular medicines. This is especially important if you are taking diabetes medicines or blood thinners. ? Taking medicines such as aspirin and ibuprofen. These medicines can  thin your blood. Do not take these medicines unless your health care provider tells you to take them. ? Taking over-the-counter medicines, vitamins, herbs, and supplements. General instructions  Follow instructions from your health care provider about eating or drinking restrictions.  Plan to have someone take you home from the hospital or clinic.  If you will be going home right after the procedure, plan to have someone with you for 24 hours.  Ask your health care provider what steps will be taken to help prevent infection. These may include washing your skin with a germ-killing soap. What happens during the procedure?   An IV will be inserted into one of your veins.  Sticky patches (electrodes) or metal paddles may be placed on your chest.  You will be given a medicine to help you relax (sedative).  An electrical shock will be delivered. The procedure may vary among health care providers and hospitals. What can I expect after the procedure?  Your blood pressure, heart rate, breathing rate, and blood oxygen level will be monitored until you leave the hospital or clinic.  Your heart rhythm will be watched to make sure it does not change.  You may have some redness on the skin where the shocks were given. Follow these instructions at home:  Do not drive for 24 hours if you were given a sedative during your procedure.  Take over-the-counter and prescription medicines only as told by your health care provider.  Ask your health care provider how to check your pulse. Check it often.  Rest for 48 hours after the procedure or   as told by your health care provider.  Avoid or limit your caffeine use as told by your health care provider.  Keep all follow-up visits as told by your health care provider. This is important. Contact a health care provider if:  You feel like your heart is beating too quickly or your pulse is not regular.  You have a serious muscle cramp that does not go  away. Get help right away if:  You have discomfort in your chest.  You are dizzy or you feel faint.  You have trouble breathing or you are short of breath.  Your speech is slurred.  You have trouble moving an arm or leg on one side of your body.  Your fingers or toes turn cold or blue. Summary  Electrical cardioversion is the delivery of a jolt of electricity to restore a normal rhythm to the heart.  This procedure may be done right away in an emergency or may be a scheduled procedure if the condition is not an emergency.  Generally, this is a safe procedure.  After the procedure, check your pulse often as told by your health care provider. This information is not intended to replace advice given to you by your health care provider. Make sure you discuss any questions you have with your health care provider. Document Revised: 08/31/2018 Document Reviewed: 08/31/2018 Elsevier Patient Education  2020 [employer]  

## 2019-07-13 NOTE — CV Procedure (Signed)
° °  DIRECT CURRENT CARDIOVERSION  NAME:  Amy Tran    MRN: [id number] DOB:  04-Aug-1955    ADMIT DATE: 07/13/2019  Indication:  Symptomatic atrial fibrillation  Procedure Note:  The patient signed informed consent.  They have had had therapeutic anticoagulation with xarelto greater than 3 weeks.  Anesthesia was administered by Dr. Daiva Huge.  Adequate airway was maintained throughout and vital followed per protocol.  They were cardioverted x 1 with 200J of biphasic synchronized energy.  They converted to NSR.  There were no apparent complications.  The patient had normal neuro status and respiratory status post procedure with vitals stable as recorded elsewhere.    Follow up: They will continue on current medical therapy and follow up with cardiology as scheduled.  [place] T. Audie Box, [place]  [address] [place], [place] [address] [phone]  12:05 PM

## 2019-07-13 NOTE — Anesthesia Postprocedure Evaluation (Signed)
Anesthesia Post Note  Patient: Amy Tran  Procedure(s) Performed: CARDIOVERSION (N/A )     Patient location during evaluation: PACU Anesthesia Type: General Level of consciousness: awake and alert and oriented Pain management: pain level controlled Vital Signs Assessment: post-procedure vital signs reviewed and stable Respiratory status: spontaneous breathing, nonlabored ventilation and respiratory function stable Cardiovascular status: blood pressure returned to baseline Postop Assessment: no apparent nausea or vomiting Anesthetic complications: no    Last Vitals:  Vitals:   07/13/19 1215 07/13/19 1225  BP: (!) 91/46 (!) 100/40  Pulse: (!) 44 (!) 47  Resp: 19 20  Temp:    SpO2: 100% 100%    Last Pain:  Vitals:   07/13/19 1225  TempSrc:   PainSc: 0-No pain                 Brennan Bailey

## 2019-07-13 NOTE — Interval H&P Note (Signed)
History and Physical Interval Note:  07/13/2019 11:58 AM  Amy Tran  has presented today for surgery, with the diagnosis of AFIB.  The various methods of treatment have been discussed with the patient and family. After consideration of risks, benefits and other options for treatment, the patient has consented to  Procedure(s): CARDIOVERSION (N/A) as a surgical intervention.  The patient's history has been reviewed, patient examined, no change in status, stable for surgery.  I have reviewed the patient's chart and labs.  Questions were answered to the patient's satisfaction.    DCCV for Afib. NPO. On eliquis for >3 weeks.   [place] T. Audie Box, [place]  [address] [place], [place] [address] [phone]  11:58 AM

## 2019-07-13 NOTE — Transfer of Care (Signed)
Immediate Anesthesia Transfer of Care Note  Patient: Amy Tran  Procedure(s) Performed: CARDIOVERSION (N/A )  Patient Location: Endoscopy Unit  Anesthesia Type:General  Level of Consciousness: awake  Airway & Oxygen Therapy: Patient Spontanous Breathing and Patient connected to nasal cannula oxygen  Post-op Assessment: Report given to RN and Post -op Vital signs reviewed and stable  Post vital signs: Reviewed and stable  Last Vitals:  Vitals Value Taken Time  BP    Temp    Pulse    Resp    SpO2      Last Pain:  Vitals:   07/13/19 1051  TempSrc: Oral  PainSc: 6       Patients Stated Pain Goal: 2 ([id number])  Complications: No apparent anesthesia complications

## 2019-07-13 NOTE — Anesthesia Preprocedure Evaluation (Addendum)
Anesthesia Evaluation  Patient identified by MRN, date of birth, ID band Patient awake    Reviewed: Allergy & Precautions, NPO status , Patient's Chart, lab work & pertinent test results  History of Anesthesia Complications (+) PONV and history of anesthetic complications  Airway Mallampati: II  TM Distance: >3 FB Neck ROM: Full    Dental  (+) Edentulous Upper, Edentulous Lower   Pulmonary asthma , Current Smoker,    Pulmonary exam normal        Cardiovascular hypertension, Pt. on medications Normal cardiovascular exam+ dysrhythmias Atrial Fibrillation      Neuro/Psych negative neurological ROS  negative psych ROS   GI/Hepatic Neg liver ROS, GERD  ,  Endo/Other  diabetes, Type 2  Renal/GU negative Renal ROS  negative genitourinary   Musculoskeletal  (+) Arthritis ,   Abdominal   Peds  Hematology negative hematology ROS (+)   Anesthesia Other Findings Day of surgery medications reviewed with patient.  Reproductive/Obstetrics negative OB ROS                            Anesthesia Physical Anesthesia Plan  ASA: III  Anesthesia Plan: General   Post-op Pain Management:    Induction: Intravenous  PONV Risk Score and Plan: 4 or greater and Treatment may vary due to age or medical condition and Propofol infusion  Airway Management Planned: Mask  Additional Equipment: None  Intra-op Plan:   Post-operative Plan:   Informed Consent: I have reviewed the patients History and Physical, chart, labs and discussed the procedure including the risks, benefits and alternatives for the proposed anesthesia with the patient or authorized representative who has indicated his/her understanding and acceptance.       Plan Discussed with: CRNA  Anesthesia Plan Comments:        Anesthesia Quick Evaluation

## 2019-07-14 ENCOUNTER — Encounter (HOSPITAL_COMMUNITY): Payer: Medicaid Other

## 2019-07-16 ENCOUNTER — Telehealth (HOSPITAL_COMMUNITY): Payer: Self-pay | Admitting: *Deleted

## 2019-07-16 ENCOUNTER — Encounter (HOSPITAL_COMMUNITY): Payer: Medicaid Other

## 2019-07-16 NOTE — Telephone Encounter (Signed)
Patient called with complaint of increased shortness of breath that started last evening - was able to sleep without shortness of breath some ankle swelling. Weight was 276lbs at home today. BP 145/76 HR 54. Discussed with Adline Peals PA will give extra 20mg  of toresmide for next 3 days then call Monday with report.

## 2019-07-19 ENCOUNTER — Ambulatory Visit (HOSPITAL_COMMUNITY): Payer: Medicaid Other | Admitting: Nurse Practitioner

## 2019-07-19 ENCOUNTER — Encounter (HOSPITAL_COMMUNITY): Payer: Medicaid Other

## 2019-07-21 ENCOUNTER — Encounter (HOSPITAL_COMMUNITY): Payer: Medicaid Other

## 2019-07-22 ENCOUNTER — Ambulatory Visit (HOSPITAL_COMMUNITY)
Admission: RE | Admit: 2019-07-22 | Discharge: 2019-07-22 | Disposition: A | Discharge status: Home / Self Care | Payer: Medicaid Other | Source: Ambulatory Visit | Attending: Nurse Practitioner | Admitting: Nurse Practitioner

## 2019-07-22 ENCOUNTER — Other Ambulatory Visit: Payer: Self-pay

## 2019-07-22 ENCOUNTER — Encounter (HOSPITAL_COMMUNITY): Payer: Self-pay | Admitting: Nurse Practitioner

## 2019-07-22 ENCOUNTER — Ambulatory Visit (HOSPITAL_COMMUNITY)
Admission: RE | Admit: 2019-07-22 | Discharge: 2019-07-22 | Disposition: A | Discharge status: Home / Self Care | Payer: Medicaid Other | Source: Ambulatory Visit | Attending: Physician Assistant | Admitting: Physician Assistant

## 2019-07-22 VITALS — BP 120/56 | HR 49 | Ht 64.0 in | Wt 274.2 lb

## 2019-07-22 DIAGNOSIS — Z955 Presence of coronary angioplasty implant and graft: Secondary | ICD-10-CM | POA: Diagnosis not present

## 2019-07-22 DIAGNOSIS — Z7901 Long term (current) use of anticoagulants: Secondary | ICD-10-CM | POA: Diagnosis not present

## 2019-07-22 DIAGNOSIS — I10 Essential (primary) hypertension: Secondary | ICD-10-CM | POA: Diagnosis not present

## 2019-07-22 DIAGNOSIS — Z833 Family history of diabetes mellitus: Secondary | ICD-10-CM | POA: Insufficient documentation

## 2019-07-22 DIAGNOSIS — D6869 Other thrombophilia: Secondary | ICD-10-CM

## 2019-07-22 DIAGNOSIS — Z880 Allergy status to penicillin: Secondary | ICD-10-CM | POA: Diagnosis not present

## 2019-07-22 DIAGNOSIS — Z85828 Personal history of other malignant neoplasm of skin: Secondary | ICD-10-CM | POA: Diagnosis not present

## 2019-07-22 DIAGNOSIS — M1990 Unspecified osteoarthritis, unspecified site: Secondary | ICD-10-CM | POA: Insufficient documentation

## 2019-07-22 DIAGNOSIS — I495 Sick sinus syndrome: Secondary | ICD-10-CM | POA: Diagnosis not present

## 2019-07-22 DIAGNOSIS — E669 Obesity, unspecified: Secondary | ICD-10-CM | POA: Insufficient documentation

## 2019-07-22 DIAGNOSIS — I252 Old myocardial infarction: Secondary | ICD-10-CM | POA: Insufficient documentation

## 2019-07-22 DIAGNOSIS — Z794 Long term (current) use of insulin: Secondary | ICD-10-CM | POA: Insufficient documentation

## 2019-07-22 DIAGNOSIS — F17210 Nicotine dependence, cigarettes, uncomplicated: Secondary | ICD-10-CM | POA: Diagnosis not present

## 2019-07-22 DIAGNOSIS — Z7902 Long term (current) use of antithrombotics/antiplatelets: Secondary | ICD-10-CM | POA: Insufficient documentation

## 2019-07-22 DIAGNOSIS — I2510 Atherosclerotic heart disease of native coronary artery without angina pectoris: Secondary | ICD-10-CM | POA: Diagnosis not present

## 2019-07-22 DIAGNOSIS — E785 Hyperlipidemia, unspecified: Secondary | ICD-10-CM | POA: Diagnosis not present

## 2019-07-22 DIAGNOSIS — K219 Gastro-esophageal reflux disease without esophagitis: Secondary | ICD-10-CM | POA: Insufficient documentation

## 2019-07-22 DIAGNOSIS — E119 Type 2 diabetes mellitus without complications: Secondary | ICD-10-CM | POA: Insufficient documentation

## 2019-07-22 DIAGNOSIS — Z79899 Other long term (current) drug therapy: Secondary | ICD-10-CM | POA: Insufficient documentation

## 2019-07-22 DIAGNOSIS — I4819 Other persistent atrial fibrillation: Secondary | ICD-10-CM | POA: Insufficient documentation

## 2019-07-22 DIAGNOSIS — Z6841 Body Mass Index (BMI) 40.0 and over, adult: Secondary | ICD-10-CM | POA: Insufficient documentation

## 2019-07-22 DIAGNOSIS — Z8249 Family history of ischemic heart disease and other diseases of the circulatory system: Secondary | ICD-10-CM | POA: Insufficient documentation

## 2019-07-22 DIAGNOSIS — J45909 Unspecified asthma, uncomplicated: Secondary | ICD-10-CM | POA: Diagnosis not present

## 2019-07-22 DIAGNOSIS — R001 Bradycardia, unspecified: Secondary | ICD-10-CM | POA: Diagnosis not present

## 2019-07-22 DIAGNOSIS — Z86718 Personal history of other venous thrombosis and embolism: Secondary | ICD-10-CM | POA: Insufficient documentation

## 2019-07-22 NOTE — Progress Notes (Addendum)
Primary Care Physician: Asencion Noble, MD Primary Cardiologist: Dr Harl Bowie Primary Electrophysiologist: none Referring Physician: Dr Serita Kyle is a 64 y.o. 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 The patient was initially diagnosed with atrial fibrillation in the setting of her NSTEMI. Patient is on Xarelto for a CHADS2VASC score of 3. Patient underwent DCCV on 06/03/19 but unfortunately was back in afib by her follow up on 06/22/19. Patient continues to have symptoms of fatigue despite good rate control. She denies any snoring or significant alcohol use. There were no specific triggers that the patient could identify. She denies bleeding issues on anticoagulation.   Pt is in afib clinic, 6/10, f/u cardioversion. She is in Sinus brady at 49 bpm. She is c/o of being fatigued and not having enough energy to walk for periods of time, despite being in SR. Marland Kitchen She is not on AV nodal blocking drugs. . Her HR with  prior cardioversion prior to Multaq was in the upper 40's. Pt states h/o bradycardia. She had had a prior sleep study 10/2018 and only showed mild OSA with PP not indicated.   Today, she denies symptoms of palpitations, chest pain, shortness of breath, orthopnea, PND, lower extremity edema, dizziness, presyncope, syncope, snoring, daytime somnolence, bleeding, or neurologic sequela. The patient is tolerating medications without difficulties and is otherwise without complaint today.    Atrial Fibrillation Risk Factors:  she does not have symptoms or diagnosis of sleep apnea. she does not have a history of rheumatic fever. she does not have a history of alcohol use. The patient does not have a history of early familial atrial fibrillation or other  arrhythmias.  she has a BMI of Body mass index is 47.07 kg/m.Marland Kitchen Filed Weights   07/22/19 0932  Weight: 124.4 kg    Family History  Problem Relation Age of Onset  . Diabetes Father   . Hypertension Father   . Parkinson's disease Father   . Cancer Father        not sure what kind  . Hypertension Mother   . Heart disease Mother   . Drug abuse Sister   . Mesothelioma Brother   . Cancer Daughter        non-hodgkins lmphoma  . Hypertension Brother   . Diabetes Brother   . Colon cancer Neg Hx   . Gastric cancer Neg Hx   . Esophageal cancer Neg Hx      Atrial Fibrillation Management history:  Previous antiarrhythmic drugs: none Previous cardioversions: 06/03/19 Previous ablations: none CHADS2VASC score: 3 Anticoagulation history: Xarelto   Past Medical History:  Diagnosis Date  . Arthritis   . Asthma   . Cancer [facility])    multiple skin cancers  . Diabetes mellitus without complication ([place])   . Diverticulosis   . Dysrhythmia   . Fluid retention   . GERD (gastroesophageal reflux disease)   . Heart murmur   . High cholesterol   . Hypertension   . Incomplete rotator cuff tear   . Myocardial infarction ([place])   . PONV (postoperative nausea and vomiting)   . Wears glasses   . Wears partial dentures    bottom   Past Surgical History:  Procedure Laterality Date  . APPENDECTOMY    . CARDIOVERSION N/A 06/03/2019  Procedure: CARDIOVERSION;  Surgeon: Herminio Commons, MD;  Location: [facility];  Service: Cardiovascular;  Laterality: N/A;  . CARDIOVERSION N/A 07/13/2019   Procedure: CARDIOVERSION;  Surgeon: Geralynn Rile, MD;  Location: [place];  Service: Cardiovascular;  Laterality: N/A;  . CARPAL TUNNEL RELEASE Bilateral 2001   bilateral  . CERVICAL POLYPECTOMY  04/08/2017   Procedure: POLYPECTOMY;  Surgeon: [name], MD;  Location: [facility];  Service: Gynecology;;  Endometrial  . CHOLECYSTECTOMY    . COLONOSCOPY  2007   XVQ:[id number] internal  hemorrhoids. Diminutive rectal polyp at 10 cm, cold biopsied/removed. The remainder of the rectal mucosa appeared normal Swallow left-sided diverticula. Diminutive polyp at the splenic flexure cold biopsied/removed (adenomatous)  . COLONOSCOPY N/A 12/28/2013   Procedure: COLONOSCOPY;  Surgeon: Daneil Dolin, MD;  [facility];  Service: Endoscopy;  Laterality: N/A;  730 - moved to 8:30 - Ginger notified pt  . CORONARY STENT INTERVENTION N/A 04/27/2019   Procedure: CORONARY STENT INTERVENTION;  Surgeon: Troy Sine, MD;  Location: [place] [facility];  Service: Cardiovascular;  Laterality: N/A;  . DILATION AND CURETTAGE, DIAGNOSTIC / THERAPEUTIC  03/07/2017  . ERCP  2002  . GASTRIC BYPASS  2004  . HYSTEROSCOPY WITH D & C N/A 04/08/2017   Procedure: DILATATION AND CURETTAGE /HYSTEROSCOPY;  Surgeon: [name], MD;  Location: [facility];  Service: Gynecology;  Laterality: N/A;  . INTRAVASCULAR ULTRASOUND/IVUS N/A 09/12/2017   Procedure: INTRAVASCULAR ULTRASOUND/IVUS;  Surgeon: Elam Dutch, MD;  Location: [place] [facility];  Service: Cardiovascular;  Laterality: N/A;  . LEFT HEART CATH AND CORONARY ANGIOGRAPHY N/A 04/27/2019   Procedure: LEFT HEART CATH AND CORONARY ANGIOGRAPHY;  Surgeon: Troy Sine, MD;  Location: [place] [facility];  Service: Cardiovascular;  Laterality: N/A;  . NECK SURGERY  1998   fusion  . PERCUTANEOUS VENOUS THROMBECTOMY,LYSIS WITH INTRAVASCULAR ULTRASOUND (IVUS) Left 09/13/2017   Procedure: REMOVAL OF LYSIS CATHETER AND INTRAVASCULAR ULTRASOUND (IVUS) LEFT ILIAC VEIN;  Surgeon: Elam Dutch, MD;  Location: [place];  Service: Vascular;  Laterality: Left;  . PERIPHERAL VASCULAR INTERVENTION  09/15/2017   Procedure: PERIPHERAL VASCULAR INTERVENTION;  Surgeon: Waynetta Sandy, MD;  Location: [place] [facility];  Service: Cardiovascular;;  VEINOUS/STENT  . PERIPHERAL VASCULAR THROMBECTOMY Left 09/12/2017   Procedure: PERIPHERAL VASCULAR THROMBECTOMY;   Surgeon: Elam Dutch, MD;  Location: [place] [facility];  Service: Cardiovascular;  Laterality: Left;  . PERIPHERAL VASCULAR THROMBECTOMY Left 09/15/2017   Procedure: PERIPHERAL VASCULAR THROMBECTOMY;  Surgeon: Waynetta Sandy, MD;  Location: [place] [facility];  Service: Cardiovascular;  Laterality: Left;  IVC TO LT FEM/POP VEIN  . SHOULDER ARTHROSCOPY WITH SUBACROMIAL DECOMPRESSION Left 06/23/2013   Procedure: LEFT SHOULDER ARTHROSCOPY WITH DEBRIDEMENT ROTATOR CUFF AND LABRUM;  Surgeon: Lorn Junes, MD;  Location: [place];  Service: Orthopedics;  Laterality: Left;  . SHOULDER SURGERY  1999   left    Current Outpatient Medications  Medication Sig Dispense Refill  . acetaminophen (TYLENOL) 500 MG tablet Take 1,000 mg by mouth daily as needed for mild pain or moderate pain.     Marland Kitchen albuterol (VENTOLIN HFA) 108 (90 Base) MCG/ACT inhaler Inhale 2 puffs into the lungs every 6 (six) hours as needed for wheezing or shortness of breath.    Marland Kitchen amLODipine (NORVASC) 5 MG tablet Take 5 mg by mouth daily.    Marland Kitchen atorvastatin (LIPITOR) 80 MG tablet Take 1 tablet (80 mg total) by mouth  daily at 6 PM. (Patient taking differently: Take 80 mg by mouth at bedtime. ) 90 tablet 1  . BD PEN NEEDLE NANO U/F 32G X 4 MM MISC USE FOR VICTOZA INJECTIONS    . cetirizine (ZYRTEC) 10 MG tablet Take 10 mg by mouth at bedtime.     . clopidogrel (PLAVIX) 75 MG tablet Take 1 tablet (75 mg total) by mouth daily with breakfast. 90 tablet 3  . Cyanocobalamin (B-12) 5000 MCG CAPS Take 1,500 mcg by mouth daily.     . diphenhydrAMINE (BENADRYL) 2 % cream Apply 1 application topically as needed for itching. For itchy palms     . dronedarone (MULTAQ) 400 MG tablet Take 1 tablet (400 mg total) by mouth 2 (two) times daily with a meal. 60 tablet 3  . fluticasone (FLONASE) 50 MCG/ACT nasal spray Place 2 sprays into both nostrils as needed for allergies.     Marland Kitchen losartan (COZAAR) 100 MG tablet Take 100 mg by  mouth daily.    . nitroGLYCERIN (NITROSTAT) 0.4 MG SL tablet Place 1 tablet (0.4 mg total) under the tongue every 5 (five) minutes as needed for chest pain. 30 tablet 0  . potassium chloride SA (KLOR-CON) 20 MEQ tablet Take 20 mEq by mouth 3 (three) times daily as needed (Cramping).     . rivaroxaban (XARELTO) 20 MG TABS tablet Take 1 tablet (20 mg total) by mouth daily with supper. (Patient taking differently: Take 20 mg by mouth at bedtime. ) 90 tablet 3  . spironolactone (ALDACTONE) 25 MG tablet Take 25 mg by mouth daily.    Marland Kitchen torsemide (DEMADEX) 20 MG tablet Take 60 mg by mouth daily.    Marland Kitchen VICTOZA 18 MG/3ML SOPN Inject 1.8 mg into the skin at bedtime.     . nicotine (NICODERM CQ - DOSED IN MG/24 HOURS) 14 mg/24hr patch Place 1 patch (14 mg total) onto the skin daily. (Patient not taking: Reported on 07/22/2019) 14 patch 0  . nicotine (NICODERM CQ - DOSED IN MG/24 HOURS) 21 mg/24hr patch Place 1 patch (21 mg total) onto the skin daily. (Patient not taking: Reported on 06/22/2019) 42 patch 0  . nicotine (NICODERM CQ - DOSED IN MG/24 HR) 7 mg/24hr patch Place 1 patch (7 mg total) onto the skin daily. (Patient not taking: Reported on 06/22/2019) 14 patch 0  . Nicotine 21-14-7 MG/24HR KIT Apply 1 patch topically daily. (Patient not taking: Reported on 07/22/2019)     No current facility-administered medications for this encounter.    Allergies  Allergen Reactions  . Penicillins Hives and Rash    Has patient had a PCN reaction causing immediate rash, facial/tongue/throat swelling, SOB or lightheadedness with hypotension: Yes Has patient had a PCN reaction causing severe rash involving mucus membranes or skin necrosis: No Has patient had a PCN reaction that required hospitalization No Has patient had a PCN reaction occurring within the last 10 years: No If all of the above answers are "NO", then may proceed with Cephalosporin use.     Social History   Socioeconomic History  . Marital status:  Widowed    Spouse name: Not on file  . Number of children: 2  . Years of education: Not on file  . Highest education level: Not on file  Occupational History  . Occupation: works at [employer]: [employer]   Tobacco Use  . Smoking status: Current Every Day Smoker    Packs/day: 0.50    Years: 29.00  Pack years: 14.50    Types: Cigarettes    Start date: 04/09/1970  . Smokeless tobacco: Never Used  . Tobacco comment: 10 ciggs per day - 06/22/19  Vaping Use  . Vaping Use: Never used  Substance and Sexual Activity  . Alcohol use: Not Currently  . Drug use: No  . Sexual activity: Not Currently    Birth control/protection: Post-menopausal  Other Topics Concern  . Not on file  Social History Narrative  . Not on file   Social Determinants of Health   Financial Resource Strain:   . Difficulty of Paying Living Expenses:   Food Insecurity:   . Worried About Charity fundraiser in the Last Year:   . Arboriculturist in the Last Year:   Transportation Needs:   . Film/video editor (Medical):   Marland Kitchen Lack of Transportation (Non-Medical):   Physical Activity:   . Days of Exercise per Week:   . Minutes of Exercise per Session:   Stress:   . Feeling of Stress :   Social Connections:   . Frequency of Communication with Friends and Family:   . Frequency of Social Gatherings with Friends and Family:   . Attends Religious Services:   . Active Member of Clubs or Organizations:   . Attends Archivist Meetings:   Marland Kitchen Marital Status:   Intimate Partner Violence:   . Fear of Current or Ex-Partner:   . Emotionally Abused:   Marland Kitchen Physically Abused:   . Sexually Abused:      ROS- All systems are reviewed and negative except as per the HPI above.  Physical Exam: Vitals:   07/22/19 0932  BP: (!) 120/56  Pulse: (!) 49  Weight: 124.4 kg  Height: _0  (1.626 m)    GEN- The patient is well appearing obese female, alert and oriented x 3 today.   Head- normocephalic,  atraumatic Eyes-  Sclera clear, conjunctiva pink Ears- hearing intact Oropharynx- clear Neck- supple  Lungs- Clear to ausculation bilaterally, normal work of breathing Heart- regular rate and rhythm, no murmurs, rubs or gallops  GI- soft, NT, ND, + BS Extremities- no clubbing, cyanosis, or edema MS- no significant deformity or atrophy Skin- no rash or lesion Psych- euthymic mood, full affect Neuro- strength and sensation are intact  Wt Readings from Last 3 Encounters:  07/22/19 124.4 kg  07/13/19 125.2 kg  07/06/19 125.4 kg    EKG today demonstrates  Sinus brady at 49 bpm, PR int 170 ms, qrs int 78 ms, qtc 422 ms  Echo 04/28/19 demonstrated  1. Left ventricular ejection fraction, by estimation, is 55 to 60%. Left  ventricular ejection fraction by 3D volume is 58 %. The left ventricle has  normal function. The left ventricle has no regional wall motion  abnormalities. The left ventricular  internal cavity size was mildly dilated. There is mild left ventricular  hypertrophy. Left ventricular diastolic parameters are indeterminate.  2. Right ventricular systolic function is normal. The right ventricular  size is mildly enlarged. There is mildly elevated pulmonary artery  systolic pressure. The estimated right ventricular systolic pressure is  23.3 mmHg.  3. The mitral valve is normal in structure. Mild mitral valve  regurgitation.  4. The aortic valve is tricuspid. Aortic valve regurgitation is not  visualized. Mild aortic stenosis. Vmax 2.8 m/s, MG 51mHg, AVA 1.4 cm^2,  DI 0.55  5. Aortic dilatation noted. There is mild dilatation of the ascending  aorta measuring 36 mm.  6. The inferior vena cava is dilated in size with <50% respiratory  variability, suggesting right atrial pressure of 15 mmHg.   Epic records are reviewed at length today  CHA2DS2-VASc Score = 3  The patient's score is based upon: CHF History: 0 HTN History: 1 Age : 0 Diabetes History: 0 Stroke  History: 0 Vascular Disease History: 1 Gender: 1      ASSESSMENT AND PLAN: 1. Persistent Atrial Fibrillation (ICD10:  I48.19) Pt had successful repeat cardioversion on 07/13/19 after starting on Multaq 400 mg bid .  Continue Xarelto 20 mg daily. Patient denies any missed doses in the last 3 weeks.  2. Bradycardia   Pt is c/o of fatigue even with return of SR with HR's in the 40's  Will continue Multaq 400 mg bid   Had slow v response with afib as well  Will place a  ZIO monitor to further assess bradycardia x one week   3. Secondary Hypercoagulable State (ICD10:  D68.69) The patient is at significant risk for stroke/thromboembolism based upon her CHA2DS2-VASc Score of 3.  Continue Rivaroxaban (Xarelto).   4. Obesity Body mass index is 47.07 kg/m. Lifestyle modification was discussed at length including regular exercise and weight reduction.  5. CAD S/p PCI to RCA 04/27/19. No anginal symptoms  6. HTN Stable, no changes today.   Follow up in the AF clinic with Adline Peals, PA, in 3 weeks   Geroge Baseman. Mila [facility] [address] [place], [place] [address] [phone]  07/22/2019 10:45 AM

## 2019-07-22 NOTE — Patient Instructions (Signed)
Go back to normal dosing of toresmide  Turn monitor in 1 week

## 2019-07-22 NOTE — Addendum Note (Signed)
Encounter addended by: Sherran Needs, NP on: 07/22/2019 11:23 AM  Actions taken: Clinical Note Signed

## 2019-07-23 ENCOUNTER — Encounter (HOSPITAL_COMMUNITY): Payer: Medicaid Other

## 2019-07-26 ENCOUNTER — Encounter (HOSPITAL_COMMUNITY): Payer: Medicaid Other

## 2019-07-27 ENCOUNTER — Ambulatory Visit (HOSPITAL_COMMUNITY): Payer: Medicaid Other | Admitting: Physician Assistant

## 2019-07-28 ENCOUNTER — Encounter (HOSPITAL_COMMUNITY): Payer: Medicaid Other

## 2019-07-30 ENCOUNTER — Encounter (HOSPITAL_COMMUNITY): Payer: Medicaid Other

## 2019-08-02 ENCOUNTER — Encounter (HOSPITAL_COMMUNITY): Payer: Medicaid Other

## 2019-08-04 ENCOUNTER — Encounter (HOSPITAL_COMMUNITY): Payer: Medicaid Other

## 2019-08-06 ENCOUNTER — Encounter (HOSPITAL_COMMUNITY): Payer: Medicaid Other

## 2019-08-06 ENCOUNTER — Other Ambulatory Visit: Payer: Self-pay

## 2019-08-06 ENCOUNTER — Encounter (HOSPITAL_COMMUNITY): Payer: Self-pay | Admitting: Emergency Medicine

## 2019-08-06 ENCOUNTER — Emergency Department (HOSPITAL_COMMUNITY): Payer: Medicaid Other

## 2019-08-06 ENCOUNTER — Emergency Department (HOSPITAL_COMMUNITY)
Admission: EM | Admit: 2019-08-06 | Discharge: 2019-08-07 | Disposition: A | Discharge status: Home / Self Care | Payer: Medicaid Other | Attending: Emergency Medicine | Admitting: Emergency Medicine

## 2019-08-06 DIAGNOSIS — Z7984 Long term (current) use of oral hypoglycemic drugs: Secondary | ICD-10-CM | POA: Diagnosis not present

## 2019-08-06 DIAGNOSIS — M25561 Pain in right knee: Secondary | ICD-10-CM | POA: Diagnosis present

## 2019-08-06 DIAGNOSIS — J45909 Unspecified asthma, uncomplicated: Secondary | ICD-10-CM | POA: Insufficient documentation

## 2019-08-06 DIAGNOSIS — T1490XA Injury, unspecified, initial encounter: Secondary | ICD-10-CM

## 2019-08-06 DIAGNOSIS — E119 Type 2 diabetes mellitus without complications: Secondary | ICD-10-CM | POA: Insufficient documentation

## 2019-08-06 DIAGNOSIS — F17210 Nicotine dependence, cigarettes, uncomplicated: Secondary | ICD-10-CM | POA: Insufficient documentation

## 2019-08-06 DIAGNOSIS — Z79899 Other long term (current) drug therapy: Secondary | ICD-10-CM | POA: Insufficient documentation

## 2019-08-06 DIAGNOSIS — I10 Essential (primary) hypertension: Secondary | ICD-10-CM | POA: Diagnosis not present

## 2019-08-06 DIAGNOSIS — M25461 Effusion, right knee: Secondary | ICD-10-CM | POA: Diagnosis not present

## 2019-08-06 DIAGNOSIS — M25469 Effusion, unspecified knee: Secondary | ICD-10-CM

## 2019-08-06 NOTE — ED Triage Notes (Signed)
Pt presents to ED POv. Pt c/o R knee pain. Pt states that it began a few days ago but today it worsened and she is unable to walk. Pt does not report and injury to leg. Pt has chronic BLE edema, takes lasix regularly

## 2019-08-06 NOTE — ED Notes (Signed)
Pt stepped outside to get some fresh air.

## 2019-08-07 MED ORDER — ACETAMINOPHEN ER 650 MG PO TBCR
650.0000 mg | EXTENDED_RELEASE_TABLET | Freq: Three times a day (TID) | ORAL | 0 refills | Status: DC
Start: 2019-08-07 — End: 2019-09-20

## 2019-08-07 MED ORDER — OXYCODONE-ACETAMINOPHEN 5-325 MG PO TABS: 1.0000 | ORAL_TABLET | Freq: Once | ORAL | Status: DC

## 2019-08-07 MED ORDER — ACETAMINOPHEN 500 MG PO TABS
1000.0000 mg | ORAL_TABLET | Freq: Once | ORAL | Status: AC
  Administered 2019-08-07: 1000 mg via ORAL
  Filled 2019-08-07: qty 2

## 2019-08-07 NOTE — ED Provider Notes (Signed)
[facility] EMERGENCY DEPARTMENT Provider Note   CSN: [id number] Arrival date & time: 08/06/19  2057     History Chief Complaint  Patient presents with  . Leg Pain    Amy Tran is a 64 y.o. female.  HPI     64 year old female comes in a chief complaint of leg pain. Patient has history of diabetes, hypertension, CAD and chronic knee pain.  Patient reports that over the last few days she has had increased pain in her right knee.  She is on blood thinners but denies any trauma/twisting injury.  Patient has no nausea, vomiting, fevers, chills.  No history of instrumentations recently but she has required cortisone shots in the past.  She has an appoint with orthopedic surgery in 3 days.  She has not taken any OTC meds yet.  Past Medical History:  Diagnosis Date  . Arthritis   . Asthma   . Cancer [facility])    multiple skin cancers  . Diabetes mellitus without complication ([place])   . Diverticulosis   . Dysrhythmia   . Fluid retention   . GERD (gastroesophageal reflux disease)   . Heart murmur   . High cholesterol   . Hypertension   . Incomplete rotator cuff tear   . Myocardial infarction ([place])   . PONV (postoperative nausea and vomiting)   . Wears glasses   . Wears partial dentures    bottom    Patient Active Problem List   Diagnosis Date Noted  . Persistent atrial fibrillation ([place]) 07/06/2019  . Secondary hypercoagulable state ([place]) 07/06/2019  . Acute coronary syndrome ([place])   . Chest pain 04/26/2019  . Tobacco dependence 04/26/2019  . NSTEMI (non-ST elevated myocardial infarction) ([place]) 04/26/2019  . Iliac vein thrombosis, left ([place]) 09/12/2017  . Hypokalemia 04/02/2017  . GERD (gastroesophageal reflux disease) 10/21/2016  . Abdominal pain 10/21/2016  . Extrinsic asthma without complication 62/83/1517  . Oral thrush 07/01/2016  . Rectocele 09/19/2015  . Diverticulosis of colon without hemorrhage   . Hx of adenomatous colonic polyps  12/01/2013  . Pain in joint, shoulder region 06/24/2013  . Muscle weakness (generalized) 06/24/2013  . Incomplete rotator cuff tear   . Diabetes mellitus without complication ([place])   . PONV (postoperative nausea and vomiting)   . Fluid retention   . Hypertension     Past Surgical History:  Procedure Laterality Date  . APPENDECTOMY    . CARDIOVERSION N/A 06/03/2019   Procedure: CARDIOVERSION;  Surgeon: Herminio Commons, MD;  Location: [facility];  Service: Cardiovascular;  Laterality: N/A;  . CARDIOVERSION N/A 07/13/2019   Procedure: CARDIOVERSION;  Surgeon: Geralynn Rile, MD;  Location: [place];  Service: Cardiovascular;  Laterality: N/A;  . CARPAL TUNNEL RELEASE Bilateral 2001   bilateral  . CERVICAL POLYPECTOMY  04/08/2017   Procedure: POLYPECTOMY;  Surgeon: [name], MD;  Location: [facility];  Service: Gynecology;;  Endometrial  . CHOLECYSTECTOMY    . COLONOSCOPY  2007   [id number] internal hemorrhoids. Diminutive rectal polyp at 10 cm, cold biopsied/removed. The remainder of the rectal mucosa appeared normal Swallow left-sided diverticula. Diminutive polyp at the splenic flexure cold biopsied/removed (adenomatous)  . COLONOSCOPY N/A 12/28/2013   Procedure: COLONOSCOPY;  Surgeon: Daneil Dolin, MD;  Location: [facility];  Service: Endoscopy;  Laterality: N/A;  730 - moved to 8:30 - [place] notified pt  . CORONARY STENT INTERVENTION N/A 04/27/2019   Procedure: CORONARY STENT INTERVENTION;  Surgeon: Troy Sine, MD;  Location: [place] CV LAB;  Service: Cardiovascular;  Laterality: N/A;  . DILATION AND CURETTAGE, DIAGNOSTIC / THERAPEUTIC  03/07/2017  . ERCP  2002  . GASTRIC BYPASS  2004  . HYSTEROSCOPY WITH D & C N/A 04/08/2017   Procedure: DILATATION AND CURETTAGE /HYSTEROSCOPY;  Surgeon: [name], MD;  Location: [facility];  Service: Gynecology;  Laterality: N/A;  . INTRAVASCULAR ULTRASOUND/IVUS N/A 09/12/2017   Procedure: INTRAVASCULAR ULTRASOUND/IVUS;   Surgeon: Elam Dutch, MD;  Location: [place] CV LAB;  Service: Cardiovascular;  Laterality: N/A;  . LEFT HEART CATH AND CORONARY ANGIOGRAPHY N/A 04/27/2019   Procedure: LEFT HEART CATH AND CORONARY ANGIOGRAPHY;  Surgeon: Troy Sine, MD;  Location: [place] CV LAB;  Service: Cardiovascular;  Laterality: N/A;  . NECK SURGERY  1998   fusion  . PERCUTANEOUS VENOUS THROMBECTOMY,LYSIS WITH INTRAVASCULAR ULTRASOUND (IVUS) Left 09/13/2017   Procedure: REMOVAL OF LYSIS CATHETER AND INTRAVASCULAR ULTRASOUND (IVUS) LEFT ILIAC VEIN;  Surgeon: Elam Dutch, MD;  Location: [place];  Service: Vascular;  Laterality: Left;  . PERIPHERAL VASCULAR INTERVENTION  09/15/2017   Procedure: PERIPHERAL VASCULAR INTERVENTION;  Surgeon: Waynetta Sandy, MD;  Location: [place] CV LAB;  Service: Cardiovascular;;  VEINOUS/STENT  . PERIPHERAL VASCULAR THROMBECTOMY Left 09/12/2017   Procedure: PERIPHERAL VASCULAR THROMBECTOMY;  Surgeon: Elam Dutch, MD;  Location: [place] CV LAB;  Service: Cardiovascular;  Laterality: Left;  . PERIPHERAL VASCULAR THROMBECTOMY Left 09/15/2017   Procedure: PERIPHERAL VASCULAR THROMBECTOMY;  Surgeon: Waynetta Sandy, MD;  Location: [place] CV LAB;  Service: Cardiovascular;  Laterality: Left;  IVC TO LT FEM/POP VEIN  . SHOULDER ARTHROSCOPY WITH SUBACROMIAL DECOMPRESSION Left 06/23/2013   Procedure: LEFT SHOULDER ARTHROSCOPY WITH DEBRIDEMENT ROTATOR CUFF AND LABRUM;  Surgeon: Lorn Junes, MD;  Location: [place];  Service: Orthopedics;  Laterality: Left;  . SHOULDER SURGERY  1999   left     OB History    Gravida  2   Para  2   Term  2   Preterm      AB      Living  2     SAB      TAB      Ectopic      Multiple      Live Births  2           Family History  Problem Relation Age of Onset  . Diabetes Father   . Hypertension Father   . Parkinson's disease Father   . Cancer Father        not sure what  kind  . Hypertension Mother   . Heart disease Mother   . Drug abuse Sister   . Mesothelioma Brother   . Cancer Daughter        non-hodgkins lmphoma  . Hypertension Brother   . Diabetes Brother   . Colon cancer Neg Hx   . Gastric cancer Neg Hx   . Esophageal cancer Neg Hx     Social History   Tobacco Use  . Smoking status: Current Every Day Smoker    Packs/day: 0.50    Years: 29.00    Pack years: 14.50    Types: Cigarettes    Start date: 04/09/1970  . Smokeless tobacco: Never Used  . Tobacco comment: 10 ciggs per day - 06/22/19  Vaping Use  . Vaping Use: Never used  Substance Use Topics  . Alcohol use: Not Currently  . Drug use: No  Home Medications Prior to Admission medications   Medication Sig Start Date End Date Taking? Authorizing Provider  acetaminophen (TYLENOL 8 HOUR) 650 MG CR tablet Take 1 tablet (650 mg total) by mouth every 8 (eight) hours. 08/07/19   Varney Biles, MD  albuterol (VENTOLIN HFA) 108 (90 Base) MCG/ACT inhaler Inhale 2 puffs into the lungs every 6 (six) hours as needed for wheezing or shortness of breath.    [provider]  amLODipine (NORVASC) 5 MG tablet Take 5 mg by mouth daily.    [provider]  atorvastatin (LIPITOR) 80 MG tablet Take 1 tablet (80 mg total) by mouth daily at 6 PM. Patient taking differently: Take 80 mg by mouth at bedtime.  05/26/19   Arnoldo Lenis, MD  BD PEN NEEDLE NANO U/F 32G X 4 MM MISC USE FOR [place] INJECTIONS 08/03/18   [provider]  cetirizine (ZYRTEC) 10 MG tablet Take 10 mg by mouth at bedtime.     [provider]  clopidogrel (PLAVIX) 75 MG tablet Take 1 tablet (75 mg total) by mouth daily with breakfast. 05/24/19   Imogene Burn, PA-C  Cyanocobalamin (B-12) 5000 MCG CAPS Take 1,500 mcg by mouth daily.     [provider]  diphenhydrAMINE (BENADRYL) 2 % cream Apply 1 application topically as needed for itching. For itchy palms     [provider]    dronedarone (MULTAQ) 400 MG tablet Take 1 tablet (400 mg total) by mouth 2 (two) times daily with a meal. 07/06/19   Fenton, Clint R, PA  fluticasone (FLONASE) 50 MCG/ACT nasal spray Place 2 sprays into both nostrils as needed for allergies.  06/29/18   [provider]  losartan (COZAAR) 100 MG tablet Take 100 mg by mouth daily.    [provider]  nicotine (NICODERM CQ - DOSED IN MG/24 HOURS) 14 mg/24hr patch Place 1 patch (14 mg total) onto the skin daily. Patient not taking: Reported on 07/22/2019 05/24/19   Imogene Burn, PA-C  nicotine (NICODERM CQ - DOSED IN MG/24 HOURS) 21 mg/24hr patch Place 1 patch (21 mg total) onto the skin daily. Patient not taking: Reported on 06/22/2019 05/24/19   Imogene Burn, PA-C  nicotine (NICODERM CQ - DOSED IN MG/24 HR) 7 mg/24hr patch Place 1 patch (7 mg total) onto the skin daily. Patient not taking: Reported on 06/22/2019 05/24/19   Imogene Burn, PA-C  Nicotine 21-14-7 MG/24HR KIT Apply 1 patch topically daily. Patient not taking: Reported on 07/22/2019 05/24/19   [provider]  nitroGLYCERIN (NITROSTAT) 0.4 MG SL tablet Place 1 tablet (0.4 mg total) under the tongue every 5 (five) minutes as needed for chest pain. 04/28/19   Florencia Reasons, MD  potassium chloride SA (KLOR-CON) 20 MEQ tablet Take 20 mEq by mouth 3 (three) times daily as needed (Cramping).     [provider]  rivaroxaban (XARELTO) 20 MG TABS tablet Take 1 tablet (20 mg total) by mouth daily with supper. Patient taking differently: Take 20 mg by mouth at bedtime.  09/17/18   Elam Dutch, MD  spironolactone (ALDACTONE) 25 MG tablet Take 25 mg by mouth daily.    [provider]  torsemide (DEMADEX) 20 MG tablet Take 60 mg by mouth daily.    [provider]  VICTOZA 18 MG/3ML SOPN Inject 1.8 mg into the skin at bedtime.  08/04/18   [provider]    Allergies    Penicillins  Review of Systems  Review of Systems   Constitutional: Positive for activity change. Negative for chills and fever.  Musculoskeletal: Positive for arthralgias.  Allergic/Immunologic: Positive for immunocompromised state.  Hematological: Bruises/bleeds easily.    Physical Exam Updated Vital Signs BP 121/70   Pulse (!) 55   Temp 98.1 F (36.7 C) (Oral)   Resp 17   SpO2 96%   Physical Exam Vitals and nursing note reviewed.  Constitutional:      Appearance: She is well-developed.  HENT:     Head: Normocephalic and atraumatic.  Cardiovascular:     Rate and Rhythm: Normal rate.  Pulmonary:     Effort: Pulmonary effort is normal.  Abdominal:     General: Bowel sounds are normal.  Musculoskeletal:     Cervical back: Normal range of motion and neck supple.     Comments: Patient able to actively flex over the affected knee.  There is effusion over the right knee without any crepitus, erythema, warmth to touch.  Skin:    General: Skin is warm and dry.  Neurological:     Mental Status: She is alert and oriented to person, place, and time.     ED Results / Procedures / Treatments   Labs (all labs ordered are listed, but only abnormal results are displayed) Labs Reviewed - No data to display  EKG None  Radiology DG Knee 1-2 Views Right  Result Date: 08/06/2019 CLINICAL DATA:  Knee pain. EXAM: RIGHT KNEE - 1-2 VIEW COMPARISON:  None. FINDINGS: No evidence of fracture, dislocation, or joint effusion. No evidence of arthropathy or other focal bone abnormality. There is moderate severity anterior soft tissue swelling. Mild vascular calcification is noted. IMPRESSION: Moderate severity anterior soft tissue swelling without evidence of an acute osseous abnormality. Electronically Signed   By: Virgina Norfolk M.D.   On: 08/06/2019 22:06    Procedures Procedures (including critical care time)  Medications Ordered in ED Medications  acetaminophen (TYLENOL) tablet 1,000 mg (1,000 mg Oral Given 08/07/19 0554)    ED  Course  I have reviewed the triage vital signs and the nursing notes.  Pertinent labs & imaging results that were available during my care of the patient were reviewed by me and considered in my medical decision making (see chart for details).    MDM Rules/Calculators/A&P                          64 year old comes in a chief complaint of knee pain.  She is noted to have effusion of the knee without any precipitating trauma.  She is on blood thinners therefore hemarthrosis is also possible.  Clinically patient is not having septic joint.  She has an appointment coming up with orthopedist later this week.  We will provide her with crutches, knee immobilizer and some pain medications.   Final Clinical Impression(s) / ED Diagnoses Final diagnoses:  Injury  Effusion of knee, unspecified laterality    Rx / DC Orders ED Discharge Orders         Ordered    acetaminophen (TYLENOL 8 HOUR) 650 MG CR tablet  Every 8 hours     Discontinue  Reprint     08/07/19 Douglas, [place] Coker, MD 08/07/19 2329

## 2019-08-07 NOTE — Discharge Instructions (Addendum)
Use the knee immobilizer and crutches for support to your knee while walking.  Follow-up with the orthopedist doctor on Tuesday as planned. Take the medicine prescribed for pain control. Use ice and heat therapy 4 times a day for further help.

## 2019-08-07 NOTE — Progress Notes (Signed)
Orthopedic Tech Progress Note Patient Details:  Amy Tran 11/27/1955 [id number]  Ortho Devices Type of Ortho Device: Knee Immobilizer, Crutches Ortho Device/Splint Location: RLE Ortho Device/Splint Interventions: Application, Adjustment   Post Interventions Patient Tolerated: Well Instructions Provided: Adjustment of device, Poper ambulation with device   Ardie Dragoo E Erwin Nishiyama 08/07/2019, 5:46 AM

## 2019-08-07 NOTE — ED Notes (Signed)
Discharge instructions reviewed with pt. Pt verbalized understanding.   

## 2019-08-09 ENCOUNTER — Encounter (HOSPITAL_COMMUNITY): Payer: Medicaid Other

## 2019-08-11 ENCOUNTER — Encounter (HOSPITAL_COMMUNITY): Payer: Medicaid Other

## 2019-08-12 NOTE — Addendum Note (Signed)
Encounter addended by: Juluis Mire, RN on: 08/12/2019 10:58 AM  Actions taken: Imaging Exam ended

## 2019-08-12 NOTE — Progress Notes (Signed)
Primary Care Physician: Asencion Noble, MD Primary Cardiologist: Dr Harl Bowie Primary Electrophysiologist: none Referring Physician: Dr Serita Kyle is a 64 y.o. female with a history of CAD, HTN, HLD, left iliac thrombosis on Xarelto, type 2 diabetes, tobacco abuse, persistent atrial fibrillation (CHA2DS2-VASc score 3), NSTEMI 04/25/2019 (DES to proximal RCA) who presents for follow up in the [facility]. The patient was initially diagnosed with atrial fibrillation in the setting of her NSTEMI. Patient is on Xarelto for a CHADS2VASC score of 3. Patient underwent DCCV on 06/03/19 but unfortunately was back in afib by her follow up on 06/22/19. Patient continues to have symptoms of fatigue despite good rate control. She denies any snoring or significant alcohol use. There were no specific triggers that the patient could identify. She denies bleeding issues on anticoagulation.   On follow up today, patient reports she has done reasonably well. She wore a Zio patch which did not show any significant bradycardia, block, or pauses. She did have a 67% afib burden and remains in rate controlled afib today. Her fatigue has been persistent.   Today, she denies symptoms of palpitations, chest pain, shortness of breath, orthopnea, PND, lower extremity edema, dizziness, presyncope, syncope, snoring, daytime somnolence, bleeding, or neurologic sequela. The patient is tolerating medications without difficulties and is otherwise without complaint today.    Atrial Fibrillation Risk Factors:  she does not have symptoms or diagnosis of sleep apnea. she does not have a history of rheumatic fever. she does not have a history of alcohol use. The patient does not have a history of early familial atrial fibrillation or other arrhythmias.  she has a BMI of Body mass index is 47.89 kg/m.Marland Kitchen Filed Weights   08/13/19 0947  Weight: 126.6 kg    Family History  Problem Relation Age  of Onset  . Diabetes Father   . Hypertension Father   . Parkinson's disease Father   . Cancer Father        not sure what kind  . Hypertension Mother   . Heart disease Mother   . Drug abuse Sister   . Mesothelioma Brother   . Cancer Daughter        non-hodgkins lmphoma  . Hypertension Brother   . Diabetes Brother   . Colon cancer Neg Hx   . Gastric cancer Neg Hx   . Esophageal cancer Neg Hx      Atrial Fibrillation Management history:  Previous antiarrhythmic drugs: Multaq Previous cardioversions: 06/03/19, 07/13/19 Previous ablations: none CHADS2VASC score: 3 Anticoagulation history: Xarelto   Past Medical History:  Diagnosis Date  . Arthritis   . Asthma   . Cancer [facility])    multiple skin cancers  . Diabetes mellitus without complication ([place])   . Diverticulosis   . Dysrhythmia   . Fluid retention   . GERD (gastroesophageal reflux disease)   . Heart murmur   . High cholesterol   . Hypertension   . Incomplete rotator cuff tear   . Myocardial infarction ([place])   . PONV (postoperative nausea and vomiting)   . Wears glasses   . Wears partial dentures    bottom   Past Surgical History:  Procedure Laterality Date  . APPENDECTOMY    . CARDIOVERSION N/A 06/03/2019   Procedure: CARDIOVERSION;  Surgeon: Herminio Commons, MD;  Location: [facility] ORS;  Service: Cardiovascular;  Laterality: N/A;  . CARDIOVERSION N/A 07/13/2019   Procedure: CARDIOVERSION;  Surgeon: Geralynn Rile, MD;  Location: [place] ENDOSCOPY;  Service: Cardiovascular;  Laterality: N/A;  . CARPAL TUNNEL RELEASE Bilateral 2001   bilateral  . CERVICAL POLYPECTOMY  04/08/2017   Procedure: POLYPECTOMY;  Surgeon: [name], MD;  Location: [facility]S;  Service: Gynecology;;  Endometrial  . CHOLECYSTECTOMY    . COLONOSCOPY  2007   RCB:ULAGTXM internal hemorrhoids. Diminutive rectal polyp at 10 cm, cold biopsied/removed. The remainder of the rectal mucosa appeared normal Swallow left-sided diverticula.  Diminutive polyp at the splenic flexure cold biopsied/removed (adenomatous)  . COLONOSCOPY N/A 12/28/2013   Procedure: COLONOSCOPY;  Surgeon: Daneil Dolin, MD;  [facility]: [facility];  Service: Endoscopy;  Laterality: N/A;  730 - moved to 8:30 - Ginger notified pt  . CORONARY STENT INTERVENTION N/A 04/27/2019   Procedure: CORONARY STENT INTERVENTION;  Surgeon: Troy Sine, MD;  Location: [place] CV LAB;  Service: Cardiovascular;  Laterality: N/A;  . DILATION AND CURETTAGE, DIAGNOSTIC / THERAPEUTIC  03/07/2017  . ERCP  2002  . GASTRIC BYPASS  2004  . HYSTEROSCOPY WITH D & C N/A 04/08/2017   Procedure: DILATATION AND CURETTAGE /HYSTEROSCOPY;  Surgeon: [name], MD;  Location: [facility];  Service: Gynecology;  Laterality: N/A;  . INTRAVASCULAR ULTRASOUND/IVUS N/A 09/12/2017   Procedure: INTRAVASCULAR ULTRASOUND/IVUS;  Surgeon: Elam Dutch, MD;  Location: [place] CV LAB;  Service: Cardiovascular;  Laterality: N/A;  . LEFT HEART CATH AND CORONARY ANGIOGRAPHY N/A 04/27/2019   Procedure: LEFT HEART CATH AND CORONARY ANGIOGRAPHY;  Surgeon: Troy Sine, MD;  Location: [place] CV LAB;  Service: Cardiovascular;  Laterality: N/A;  . NECK SURGERY  1998   fusion  . PERCUTANEOUS VENOUS THROMBECTOMY,LYSIS WITH INTRAVASCULAR ULTRASOUND (IVUS) Left 09/13/2017   Procedure: REMOVAL OF LYSIS CATHETER AND INTRAVASCULAR ULTRASOUND (IVUS) LEFT ILIAC VEIN;  Surgeon: Elam Dutch, MD;  Location: [place];  Service: Vascular;  Laterality: Left;  . PERIPHERAL VASCULAR INTERVENTION  09/15/2017   Procedure: PERIPHERAL VASCULAR INTERVENTION;  Surgeon: Waynetta Sandy, MD;  Location: [place] CV LAB;  Service: Cardiovascular;;  VEINOUS/STENT  . PERIPHERAL VASCULAR THROMBECTOMY Left 09/12/2017   Procedure: PERIPHERAL VASCULAR THROMBECTOMY;  Surgeon: Elam Dutch, MD;  Location: [place] CV LAB;  Service: Cardiovascular;  Laterality: Left;  . PERIPHERAL VASCULAR THROMBECTOMY Left  09/15/2017   Procedure: PERIPHERAL VASCULAR THROMBECTOMY;  Surgeon: Waynetta Sandy, MD;  Location: [place] CV LAB;  Service: Cardiovascular;  Laterality: Left;  IVC TO LT FEM/POP VEIN  . SHOULDER ARTHROSCOPY WITH SUBACROMIAL DECOMPRESSION Left 06/23/2013   Procedure: LEFT SHOULDER ARTHROSCOPY WITH DEBRIDEMENT ROTATOR CUFF AND LABRUM;  Surgeon: Lorn Junes, MD;  Location: [place];  Service: Orthopedics;  Laterality: Left;  . SHOULDER SURGERY  1999   left    Current Outpatient Medications  Medication Sig Dispense Refill  . acetaminophen (TYLENOL 8 HOUR) 650 MG CR tablet Take 1 tablet (650 mg total) by mouth every 8 (eight) hours. 30 tablet 0  . albuterol (VENTOLIN HFA) 108 (90 Base) MCG/ACT inhaler Inhale 2 puffs into the lungs every 6 (six) hours as needed for wheezing or shortness of breath.    Marland Kitchen amLODipine (NORVASC) 5 MG tablet Take 5 mg by mouth daily.    Marland Kitchen atorvastatin (LIPITOR) 80 MG tablet Take 1 tablet (80 mg total) by mouth daily at 6 PM. 90 tablet 1  . BD PEN NEEDLE NANO U/F 32G X 4 MM MISC USE FOR VICTOZA INJECTIONS    . cetirizine (ZYRTEC) 10 MG tablet Take  10 mg by mouth at bedtime.     . clopidogrel (PLAVIX) 75 MG tablet Take 1 tablet (75 mg total) by mouth daily with breakfast. 90 tablet 3  . Cyanocobalamin (B-12) 5000 MCG CAPS Take 1,500 mcg by mouth daily.     . diphenhydrAMINE (BENADRYL) 2 % cream Apply 1 application topically as needed for itching. For itchy palms     . dronedarone (MULTAQ) 400 MG tablet Take 1 tablet (400 mg total) by mouth 2 (two) times daily with a meal. 60 tablet 3  . fluticasone (FLONASE) 50 MCG/ACT nasal spray Place 2 sprays into both nostrils as needed for allergies.     Marland Kitchen losartan (COZAAR) 100 MG tablet Take 100 mg by mouth daily.    . nitroGLYCERIN (NITROSTAT) 0.4 MG SL tablet Place 1 tablet (0.4 mg total) under the tongue every 5 (five) minutes as needed for chest pain. 30 tablet 0  . potassium chloride SA (KLOR-CON)  20 MEQ tablet Take 20 mEq by mouth 3 (three) times daily as needed (Cramping).     . rivaroxaban (XARELTO) 20 MG TABS tablet Take 1 tablet (20 mg total) by mouth daily with supper. 90 tablet 3  . spironolactone (ALDACTONE) 25 MG tablet Take 25 mg by mouth daily.    Marland Kitchen torsemide (DEMADEX) 20 MG tablet Take 60 mg by mouth daily.    Marland Kitchen VICTOZA 18 MG/3ML SOPN Inject 1.8 mg into the skin at bedtime.     . nicotine (NICODERM CQ - DOSED IN MG/24 HOURS) 14 mg/24hr patch Place 1 patch (14 mg total) onto the skin daily. (Patient not taking: Reported on 07/22/2019) 14 patch 0  . nicotine (NICODERM CQ - DOSED IN MG/24 HOURS) 21 mg/24hr patch Place 1 patch (21 mg total) onto the skin daily. (Patient not taking: Reported on 06/22/2019) 42 patch 0  . nicotine (NICODERM CQ - DOSED IN MG/24 HR) 7 mg/24hr patch Place 1 patch (7 mg total) onto the skin daily. (Patient not taking: Reported on 06/22/2019) 14 patch 0  . Nicotine 21-14-7 MG/24HR KIT Apply 1 patch topically daily. (Patient not taking: Reported on 07/22/2019)     No current facility-administered medications for this encounter.    Allergies  Allergen Reactions  . Penicillins Hives and Rash    Has patient had a PCN reaction causing immediate rash, facial/tongue/throat swelling, SOB or lightheadedness with hypotension: Yes Has patient had a PCN reaction causing severe rash involving mucus membranes or skin necrosis: No Has patient had a PCN reaction that required hospitalization No Has patient had a PCN reaction occurring within the last 10 years: No If all of the above answers are "NO", then may proceed with Cephalosporin use.     Social History   Socioeconomic History  . Marital status: Widowed    Spouse name: Not on file  . Number of children: 2  . Years of education: Not on file  . Highest education level: Not on file  Occupational History  . Occupation: works at [employer]: [employer]   Tobacco Use  . Smoking status: Current Every Day  Smoker    Packs/day: 0.50    Years: 29.00    Pack years: 14.50    Types: Cigarettes    Start date: 04/09/1970  . Smokeless tobacco: Never Used  . Tobacco comment: 10 cigarettes per day - 06/22/19  Vaping Use  . Vaping Use: Never used  Substance and Sexual Activity  . Alcohol use: Not Currently  . Drug  use: No  . Sexual activity: Not Currently    Birth control/protection: Post-menopausal  Other Topics Concern  . Not on file  Social History Narrative  . Not on file   Social Determinants of Health   Financial Resource Strain:   . Difficulty of Paying Living Expenses:   Food Insecurity:   . Worried About Charity fundraiser in the Last Year:   . Arboriculturist in the Last Year:   Transportation Needs:   . Film/video editor (Medical):   Marland Kitchen Lack of Transportation (Non-Medical):   Physical Activity:   . Days of Exercise per Week:   . Minutes of Exercise per Session:   Stress:   . Feeling of Stress :   Social Connections:   . Frequency of Communication with Friends and Family:   . Frequency of Social Gatherings with Friends and Family:   . Attends Religious Services:   . Active Member of Clubs or Organizations:   . Attends Archivist Meetings:   Marland Kitchen Marital Status:   Intimate Partner Violence:   . Fear of Current or Ex-Partner:   . Emotionally Abused:   Marland Kitchen Physically Abused:   . Sexually Abused:      ROS- All systems are reviewed and negative except as per the HPI above.  Physical Exam: Vitals:   08/13/19 0947  BP: 104/60  Pulse: (!) 56  Weight: 126.6 kg  Height: _0  (1.626 m)    GEN- The patient is well appearing obese female, alert and oriented x 3 today.   HEENT-head normocephalic, atraumatic, sclera clear, conjunctiva pink, hearing intact, trachea midline. Lungs- Clear to ausculation bilaterally, normal work of breathing Heart- irregular rate and rhythm, no murmurs, rubs or gallops  GI- soft, NT, ND, + BS Extremities- no clubbing, cyanosis, or  edema MS- no significant deformity or atrophy Skin- no rash or lesion Psych- euthymic mood, full affect Neuro- strength and sensation are intact   Wt Readings from Last 3 Encounters:  08/13/19 126.6 kg  07/22/19 124.4 kg  07/13/19 125.2 kg    EKG today demonstrates afib HR 56, QRS 84, QTc 436  Echo 04/28/19 demonstrated  1. Left ventricular ejection fraction, by estimation, is 55 to 60%. Left  ventricular ejection fraction by 3D volume is 58 %. The left ventricle has  normal function. The left ventricle has no regional wall motion  abnormalities. The left ventricular  internal cavity size was mildly dilated. There is mild left ventricular  hypertrophy. Left ventricular diastolic parameters are indeterminate.  2. Right ventricular systolic function is normal. The right ventricular  size is mildly enlarged. There is mildly elevated pulmonary artery  systolic pressure. The estimated right ventricular systolic pressure is  78.6 mmHg.  3. The mitral valve is normal in structure. Mild mitral valve  regurgitation.  4. The aortic valve is tricuspid. Aortic valve regurgitation is not  visualized. Mild aortic stenosis. Vmax 2.8 m/s, MG 25mHg, AVA 1.4 cm^2,  DI 0.55  5. Aortic dilatation noted. There is mild dilatation of the ascending  aorta measuring 36 mm.  6. The inferior vena cava is dilated in size with <50% respiratory  variability, suggesting right atrial pressure of 15 mmHg.   Epic records are reviewed at length today  CHA2DS2-VASc Score = 3  The patient's score is based upon: CHF History: 0 HTN History: 1 Age : 0 Diabetes History: 0 Stroke History: 0 Vascular Disease History: 1 Gender: 1      ASSESSMENT  AND PLAN: 1. Persistent Atrial Fibrillation (ICD10:  I48.19) Pt had successful repeat cardioversion on 07/13/19 after starting on Multaq 400 mg BID. Unfortunately, Zio patch showed 67% afib burden. Appears to be failing Multaq. We discussed therapeutic options  including dofetilide and ablation. She is agreeable to consultation with EP to discuss ablation. If she is not felt to be a good candidate, she would be agreeable to dofetilide admission. Her QT in SR is 422 ms. Will stop Multaq as she is in persistent afib. Continue Xarelto 20 mg daily.   2. Secondary Hypercoagulable State (ICD10:  D68.69) The patient is at significant risk for stroke/thromboembolism based upon her CHA2DS2-VASc Score of 3.  Continue Rivaroxaban (Xarelto).   3. Bradycardia   Zio patch showed no significant bradycardia or pauses.  4. Obesity Body mass index is 47.89 kg/m. Lifestyle modification was discussed and encouraged including regular physical activity and weight reduction.  5. CAD S/p PCI to RCA 04/27/19. No anginal symptoms.  6. HTN Stable, no changes today.   Follow up with EP to consider ablation vs dofetilide.   [facility] [address] [place], [place] [address] [phone]

## 2019-08-13 ENCOUNTER — Encounter (HOSPITAL_COMMUNITY): Payer: Self-pay | Admitting: Physician Assistant

## 2019-08-13 ENCOUNTER — Other Ambulatory Visit: Payer: Self-pay

## 2019-08-13 ENCOUNTER — Ambulatory Visit (HOSPITAL_COMMUNITY)
Admission: RE | Admit: 2019-08-13 | Discharge: 2019-08-13 | Disposition: A | Discharge status: Home / Self Care | Payer: Medicaid Other | Source: Ambulatory Visit | Attending: Physician Assistant | Admitting: Physician Assistant

## 2019-08-13 ENCOUNTER — Encounter (HOSPITAL_COMMUNITY): Payer: Medicaid Other

## 2019-08-13 VITALS — BP 104/60 | HR 56 | Ht 64.0 in | Wt 279.0 lb

## 2019-08-13 DIAGNOSIS — Z880 Allergy status to penicillin: Secondary | ICD-10-CM | POA: Insufficient documentation

## 2019-08-13 DIAGNOSIS — I252 Old myocardial infarction: Secondary | ICD-10-CM | POA: Insufficient documentation

## 2019-08-13 DIAGNOSIS — Z833 Family history of diabetes mellitus: Secondary | ICD-10-CM | POA: Insufficient documentation

## 2019-08-13 DIAGNOSIS — Z7902 Long term (current) use of antithrombotics/antiplatelets: Secondary | ICD-10-CM | POA: Insufficient documentation

## 2019-08-13 DIAGNOSIS — E785 Hyperlipidemia, unspecified: Secondary | ICD-10-CM | POA: Diagnosis not present

## 2019-08-13 DIAGNOSIS — M1990 Unspecified osteoarthritis, unspecified site: Secondary | ICD-10-CM | POA: Insufficient documentation

## 2019-08-13 DIAGNOSIS — Z7901 Long term (current) use of anticoagulants: Secondary | ICD-10-CM | POA: Insufficient documentation

## 2019-08-13 DIAGNOSIS — Z8249 Family history of ischemic heart disease and other diseases of the circulatory system: Secondary | ICD-10-CM | POA: Diagnosis not present

## 2019-08-13 DIAGNOSIS — D6869 Other thrombophilia: Secondary | ICD-10-CM | POA: Diagnosis not present

## 2019-08-13 DIAGNOSIS — F17210 Nicotine dependence, cigarettes, uncomplicated: Secondary | ICD-10-CM | POA: Insufficient documentation

## 2019-08-13 DIAGNOSIS — Z85828 Personal history of other malignant neoplasm of skin: Secondary | ICD-10-CM | POA: Diagnosis not present

## 2019-08-13 DIAGNOSIS — Z794 Long term (current) use of insulin: Secondary | ICD-10-CM | POA: Insufficient documentation

## 2019-08-13 DIAGNOSIS — E669 Obesity, unspecified: Secondary | ICD-10-CM | POA: Diagnosis not present

## 2019-08-13 DIAGNOSIS — Z955 Presence of coronary angioplasty implant and graft: Secondary | ICD-10-CM | POA: Insufficient documentation

## 2019-08-13 DIAGNOSIS — E119 Type 2 diabetes mellitus without complications: Secondary | ICD-10-CM | POA: Insufficient documentation

## 2019-08-13 DIAGNOSIS — I4819 Other persistent atrial fibrillation: Secondary | ICD-10-CM | POA: Diagnosis present

## 2019-08-13 DIAGNOSIS — I2510 Atherosclerotic heart disease of native coronary artery without angina pectoris: Secondary | ICD-10-CM | POA: Insufficient documentation

## 2019-08-13 DIAGNOSIS — J45909 Unspecified asthma, uncomplicated: Secondary | ICD-10-CM | POA: Diagnosis not present

## 2019-08-13 DIAGNOSIS — Z6841 Body Mass Index (BMI) 40.0 and over, adult: Secondary | ICD-10-CM | POA: Insufficient documentation

## 2019-08-13 DIAGNOSIS — Z9884 Bariatric surgery status: Secondary | ICD-10-CM | POA: Diagnosis not present

## 2019-08-13 DIAGNOSIS — I10 Essential (primary) hypertension: Secondary | ICD-10-CM | POA: Diagnosis not present

## 2019-08-13 DIAGNOSIS — Z79899 Other long term (current) drug therapy: Secondary | ICD-10-CM | POA: Insufficient documentation

## 2019-08-13 DIAGNOSIS — K219 Gastro-esophageal reflux disease without esophagitis: Secondary | ICD-10-CM | POA: Diagnosis not present

## 2019-08-13 DIAGNOSIS — Z86718 Personal history of other venous thrombosis and embolism: Secondary | ICD-10-CM | POA: Diagnosis not present

## 2019-08-13 NOTE — Patient Instructions (Signed)
Stop Multaq 

## 2019-08-30 ENCOUNTER — Other Ambulatory Visit: Payer: Self-pay

## 2019-08-30 DIAGNOSIS — I82422 Acute embolism and thrombosis of left iliac vein: Secondary | ICD-10-CM

## 2019-08-30 NOTE — Addendum Note (Signed)
Addended byDoylene Bode on: 08/30/2019 04:21 PM   Modules accepted: Orders

## 2019-09-09 ENCOUNTER — Encounter: Payer: Self-pay | Admitting: Vascular Surgery

## 2019-09-09 ENCOUNTER — Ambulatory Visit (HOSPITAL_COMMUNITY)
Admission: RE | Admit: 2019-09-09 | Discharge: 2019-09-09 | Disposition: A | Discharge status: Home / Self Care | Payer: Medicaid Other | Source: Ambulatory Visit | Attending: Vascular Surgery | Admitting: Vascular Surgery

## 2019-09-09 ENCOUNTER — Other Ambulatory Visit: Payer: Self-pay

## 2019-09-09 ENCOUNTER — Ambulatory Visit (INDEPENDENT_AMBULATORY_CARE_PROVIDER_SITE_OTHER)
Admission: RE | Admit: 2019-09-09 | Discharge: 2019-09-09 | Disposition: A | Discharge status: Home / Self Care | Payer: Medicaid Other | Source: Ambulatory Visit | Attending: Vascular Surgery | Admitting: Vascular Surgery

## 2019-09-09 ENCOUNTER — Ambulatory Visit (INDEPENDENT_AMBULATORY_CARE_PROVIDER_SITE_OTHER): Payer: Medicaid Other | Admitting: Vascular Surgery

## 2019-09-09 VITALS — BP 120/58 | HR 48 | Temp 97.3°F | Ht 64.0 in | Wt 276.5 lb

## 2019-09-09 DIAGNOSIS — I739 Peripheral vascular disease, unspecified: Secondary | ICD-10-CM

## 2019-09-09 DIAGNOSIS — I871 Compression of vein: Secondary | ICD-10-CM | POA: Diagnosis not present

## 2019-09-09 DIAGNOSIS — I82422 Acute embolism and thrombosis of left iliac vein: Secondary | ICD-10-CM | POA: Diagnosis not present

## 2019-09-09 NOTE — Progress Notes (Signed)
Patient is a 64 year old female who returns for follow-up today.  He underwent angioplasty of her left common femoral vein with left common and external iliac venous stenting August 2019.  She is currently on Xarelto.  Other chronic medical problems include arthritis asthma diabetes elevated cholesterol hypertension, obesity all of which are currently stable.  She currently is smoking but trying to quit.  Greater than 3 minutes today spent regarding smoking cessation counseling and risk of limb loss of diabetes combined with cigarette smoking of greater than 100 fold.  Past Medical History:  Diagnosis Date  . Arthritis   . Asthma   . Cancer [facility])    multiple skin cancers  . Diabetes mellitus without complication ([place])   . Diverticulosis   . Dysrhythmia   . Fluid retention   . GERD (gastroesophageal reflux disease)   . Heart murmur   . High cholesterol   . Hypertension   . Incomplete rotator cuff tear   . Myocardial infarction ([place])   . PONV (postoperative nausea and vomiting)   . Wears glasses   . Wears partial dentures    bottom     Current Outpatient Medications on File Prior to Visit  Medication Sig Dispense Refill  . acetaminophen (TYLENOL 8 HOUR) 650 MG CR tablet Take 1 tablet (650 mg total) by mouth every 8 (eight) hours. 30 tablet 0  . albuterol (VENTOLIN HFA) 108 (90 Base) MCG/ACT inhaler Inhale 2 puffs into the lungs every 6 (six) hours as needed for wheezing or shortness of breath.    Marland Kitchen amLODipine (NORVASC) 5 MG tablet Take 5 mg by mouth daily.    Marland Kitchen atorvastatin (LIPITOR) 80 MG tablet Take 1 tablet (80 mg total) by mouth daily at 6 PM. 90 tablet 1  . BD PEN NEEDLE NANO U/F 32G X 4 MM MISC USE FOR VICTOZA INJECTIONS    . cetirizine (ZYRTEC) 10 MG tablet Take 10 mg by mouth at bedtime.     . clopidogrel (PLAVIX) 75 MG tablet Take 1 tablet (75 mg total) by mouth daily with breakfast. 90 tablet 3  . Cyanocobalamin (B-12) 5000 MCG CAPS Take 1,500 mcg by mouth daily.     .  diphenhydrAMINE (BENADRYL) 2 % cream Apply 1 application topically as needed for itching. For itchy palms     . fluticasone (FLONASE) 50 MCG/ACT nasal spray Place 2 sprays into both nostrils as needed for allergies.     Marland Kitchen losartan (COZAAR) 100 MG tablet Take 100 mg by mouth daily.    . nitroGLYCERIN (NITROSTAT) 0.4 MG SL tablet Place 1 tablet (0.4 mg total) under the tongue every 5 (five) minutes as needed for chest pain. 30 tablet 0  . potassium chloride SA (KLOR-CON) 20 MEQ tablet Take 20 mEq by mouth 3 (three) times daily as needed (Cramping).     . rivaroxaban (XARELTO) 20 MG TABS tablet Take 1 tablet (20 mg total) by mouth daily with supper. 90 tablet 3  . spironolactone (ALDACTONE) 25 MG tablet Take 25 mg by mouth daily.    Marland Kitchen torsemide (DEMADEX) 20 MG tablet Take 60 mg by mouth daily.    Marland Kitchen VICTOZA 18 MG/3ML SOPN Inject 1.8 mg into the skin at bedtime.     . nicotine (NICODERM CQ - DOSED IN MG/24 HOURS) 14 mg/24hr patch Place 1 patch (14 mg total) onto the skin daily. (Patient not taking: Reported on 07/22/2019) 14 patch 0  . nicotine (NICODERM CQ - DOSED IN MG/24 HOURS) 21 mg/24hr patch Place  1 patch (21 mg total) onto the skin daily. (Patient not taking: Reported on 06/22/2019) 42 patch 0  . nicotine (NICODERM CQ - DOSED IN MG/24 HR) 7 mg/24hr patch Place 1 patch (7 mg total) onto the skin daily. (Patient not taking: Reported on 06/22/2019) 14 patch 0  . Nicotine 21-14-7 MG/24HR KIT Apply 1 patch topically daily. (Patient not taking: Reported on 07/22/2019)     No current facility-administered medications on file prior to visit.   Physical exam:  Vitals:   09/09/19 0823  BP: (!) 120/58  Pulse: 48  Temp: (!) 97.3 F (36.3 C)  TempSrc: Skin  SpO2: 94%  Weight: (!) 276 lb 8 oz (125.4 kg)  Height: 5' 4"  (1.626 m)   Extremities: Left leg edema approximately 5% larger than the right leg with tense skin in the left calf, some early changes of lymphedema and lipedema  Skin: No  ulceration  Vascular: No palpable pedal pulses  Abdomen: Severe obesity   Data: Patient had a duplex ultrasound today which showed patent left external and common iliac venous stent.  She also had bilateral ABIs performed due to numbness and tingling in toes in the left foot.  This shows an ABI on the left of 0.53 right was greater than 1 normal and triphasic.  Assessment: 1.  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  Patient will continue to use Ace wraps since she cannot get on a compression stocking due to the size of her leg.  She will follow up with a duplex ultrasound of her iliac vein stent in 1 year in our APP clinic.  2.  Peripheral arterial disease mild symptoms currently reasonable ABI currently not at risk of limb loss was counseled to quit smoking and to walk 30 minutes daily.  She will follow up in our APP clinic in 6 months for repeat ABIs.  She will call us sooner if she develops a wound on her foot or rest pain in the left foot.  Plan: See above  [name], MD [facility] Office: [phone]

## 2019-09-10 ENCOUNTER — Other Ambulatory Visit: Payer: Self-pay

## 2019-09-10 DIAGNOSIS — I82422 Acute embolism and thrombosis of left iliac vein: Secondary | ICD-10-CM

## 2019-09-10 DIAGNOSIS — I739 Peripheral vascular disease, unspecified: Secondary | ICD-10-CM

## 2019-09-14 ENCOUNTER — Telehealth: Payer: Self-pay

## 2019-09-14 NOTE — Telephone Encounter (Signed)
Left a message regarding virtual visit on 09/15/19.

## 2019-09-15 ENCOUNTER — Other Ambulatory Visit (HOSPITAL_COMMUNITY): Payer: Self-pay | Admitting: *Deleted

## 2019-09-15 ENCOUNTER — Encounter: Payer: Self-pay | Admitting: Internal Medicine

## 2019-09-15 ENCOUNTER — Telehealth (INDEPENDENT_AMBULATORY_CARE_PROVIDER_SITE_OTHER): Payer: Medicaid Other | Admitting: Internal Medicine

## 2019-09-15 ENCOUNTER — Other Ambulatory Visit: Payer: Self-pay

## 2019-09-15 VITALS — BP 125/68 | HR 76 | Ht 65.0 in | Wt 276.0 lb

## 2019-09-15 DIAGNOSIS — D6869 Other thrombophilia: Secondary | ICD-10-CM | POA: Diagnosis not present

## 2019-09-15 DIAGNOSIS — I4819 Other persistent atrial fibrillation: Secondary | ICD-10-CM | POA: Diagnosis not present

## 2019-09-15 DIAGNOSIS — I2510 Atherosclerotic heart disease of native coronary artery without angina pectoris: Secondary | ICD-10-CM

## 2019-09-15 DIAGNOSIS — I10 Essential (primary) hypertension: Secondary | ICD-10-CM

## 2019-09-15 MED ORDER — POTASSIUM CHLORIDE CRYS ER 20 MEQ PO TBCR: EXTENDED_RELEASE_TABLET | ORAL | Status: DC

## 2019-09-15 NOTE — Progress Notes (Signed)
Electrophysiology TeleHealth Note   Due to national recommendations of social distancing due to [place] 19, Audio telehealth visit is felt to be most appropriate for this patient at this time.  See MyChart message from today for patient consent regarding telehealth for [facility].   Date:  09/15/2019   ID:  Amy Tran, DOB 28-May-1955, MRN [id number]  Location: home Provider location: [place] Evaluation Performed: New patient consult  PCP:  Asencion Noble, MD  Cardiologist:  Carlyle Dolly, MD  Electrophysiologist:  None   Chief Complaint:  afib  History of Present Illness:    Amy Tran is a 64 y.o. female who presents via audio conferencing for a telehealth visit today.   The patient is referred for new consultation regarding afib by Dr Harl Bowie and Adline Peals with the AF clinic. She reports being diagnosed with afib in March.  She had NSTEMI at that time.   She was started on xarelto and underwent cardioversion 06/03/19.  She reports feeling "better" with cardioversion.  Unfortunately, she had decline in her energy and exercise tolerance and return to afib several days later. She works as a Scientist, water quality and is too tired to do anythiing after work. She was placed on multaq and underwent repeat [facility] which was also unsuccessful.  Today, she denies symptoms of  chest pain, shortness of breath, orthopnea, PND, lower extremity edema, claudication, dizziness, presyncope, syncope, bleeding, or neurologic sequela. The patient is tolerating medications without difficulties and is otherwise without complaint today.     Past Medical History:  Diagnosis Date  . Arthritis   . Asthma   . Cancer [facility])    multiple skin cancers  . Diabetes mellitus without complication ([place])   . Diverticulosis   . Fluid retention   . GERD (gastroesophageal reflux disease)   . High cholesterol   . Hypertension   . Incomplete rotator cuff tear   . Myocardial infarction ([place])   . Obesity   .  Persistent atrial fibrillation ([place])   . PONV (postoperative nausea and vomiting)   . Wears glasses   . Wears partial dentures    bottom    Past Surgical History:  Procedure Laterality Date  . APPENDECTOMY    . CARDIOVERSION N/A 06/03/2019   Procedure: CARDIOVERSION;  Surgeon: Herminio Commons, MD;  Location: [facility];  Service: Cardiovascular;  Laterality: N/A;  . CARDIOVERSION N/A 07/13/2019   Procedure: CARDIOVERSION;  Surgeon: Geralynn Rile, MD;  Location: [place];  Service: Cardiovascular;  Laterality: N/A;  . CARPAL TUNNEL RELEASE Bilateral 2001   bilateral  . CERVICAL POLYPECTOMY  04/08/2017   Procedure: POLYPECTOMY;  Surgeon: [name], MD;  Location: [facility];  Service: Gynecology;;  Endometrial  . CHOLECYSTECTOMY    . COLONOSCOPY  2007   FTD:DUKGURK internal hemorrhoids. Diminutive rectal polyp at 10 cm, cold biopsied/removed. The remainder of the rectal mucosa appeared normal Swallow left-sided diverticula. Diminutive polyp at the splenic flexure cold biopsied/removed (adenomatous)  . COLONOSCOPY N/A 12/28/2013   Procedure: COLONOSCOPY;  Surgeon: Daneil Dolin, MD;  Location: [facility];  Service: Endoscopy;  Laterality: N/A;  730 - moved to 8:30 - Ginger notified pt  . CORONARY STENT INTERVENTION N/A 04/27/2019   Procedure: CORONARY STENT INTERVENTION;  Surgeon: Troy Sine, MD;  Location: [place] CV [facility];  Service: Cardiovascular;  Laterality: N/A;  . DILATION AND CURETTAGE, DIAGNOSTIC / THERAPEUTIC  03/07/2017  . ERCP  2002  . GASTRIC BYPASS  2004  .  HYSTEROSCOPY WITH D & C N/A 04/08/2017   Procedure: DILATATION AND CURETTAGE /HYSTEROSCOPY;  Surgeon: [name], MD;  Location: [facility];  Service: Gynecology;  Laterality: N/A;  . INTRAVASCULAR ULTRASOUND/IVUS N/A 09/12/2017   Procedure: INTRAVASCULAR ULTRASOUND/IVUS;  Surgeon: Elam Dutch, MD;  Location: [place] CV LAB;  Service: Cardiovascular;  Laterality: N/A;  . LEFT HEART CATH AND  CORONARY ANGIOGRAPHY N/A 04/27/2019   Procedure: LEFT HEART CATH AND CORONARY ANGIOGRAPHY;  Surgeon: Troy Sine, MD;  Location: [place] [facility];  Service: Cardiovascular;  Laterality: N/A;  . NECK SURGERY  1998   fusion  . PERCUTANEOUS VENOUS THROMBECTOMY,LYSIS WITH INTRAVASCULAR ULTRASOUND (IVUS) Left 09/13/2017   Procedure: REMOVAL OF LYSIS CATHETER AND INTRAVASCULAR ULTRASOUND (IVUS) LEFT ILIAC VEIN;  Surgeon: Elam Dutch, MD;  Location: [place];  Service: Vascular;  Laterality: Left;  . PERIPHERAL VASCULAR INTERVENTION  09/15/2017   Procedure: PERIPHERAL VASCULAR INTERVENTION;  Surgeon: Waynetta Sandy, MD;  Location: [place] [facility];  Service: Cardiovascular;;  VEINOUS/STENT  . PERIPHERAL VASCULAR THROMBECTOMY Left 09/12/2017   Procedure: PERIPHERAL VASCULAR THROMBECTOMY;  Surgeon: Elam Dutch, MD;  Location: [place] CV LAB;  Service: Cardiovascular;  Laterality: Left;  . PERIPHERAL VASCULAR THROMBECTOMY Left 09/15/2017   Procedure: PERIPHERAL VASCULAR THROMBECTOMY;  Surgeon: Waynetta Sandy, MD;  Location: [place] [facility] LAB;  Service: Cardiovascular;  Laterality: Left;  IVC TO LT FEM/POP VEIN  . SHOULDER ARTHROSCOPY WITH SUBACROMIAL DECOMPRESSION Left 06/23/2013   Procedure: LEFT SHOULDER ARTHROSCOPY WITH DEBRIDEMENT ROTATOR CUFF AND LABRUM;  Surgeon: Lorn Junes, MD;  Location: [place];  Service: Orthopedics;  Laterality: Left;  . SHOULDER SURGERY  1999   left    Current Outpatient Medications  Medication Sig Dispense Refill  . acetaminophen (TYLENOL 8 HOUR) 650 MG CR tablet Take 1 tablet (650 mg total) by mouth every 8 (eight) hours. 30 tablet 0  . albuterol (VENTOLIN HFA) 108 (90 Base) MCG/ACT inhaler Inhale 2 puffs into the lungs every 6 (six) hours as needed for wheezing or shortness of breath.    Marland Kitchen amLODipine (NORVASC) 5 MG tablet Take 5 mg by mouth daily.    Marland Kitchen atorvastatin (LIPITOR) 80 MG tablet Take 1 tablet (80 mg total)  by mouth daily at 6 PM. 90 tablet 1  . BD PEN NEEDLE NANO U/F 32G X 4 MM MISC USE FOR VICTOZA INJECTIONS    . cetirizine (ZYRTEC) 10 MG tablet Take 10 mg by mouth at bedtime.     . clopidogrel (PLAVIX) 75 MG tablet Take 1 tablet (75 mg total) by mouth daily with breakfast. 90 tablet 3  . Cyanocobalamin (B-12) 5000 MCG CAPS Take 1,500 mcg by mouth daily.     . diphenhydrAMINE (BENADRYL) 2 % cream Apply 1 application topically as needed for itching. For itchy palms     . fluticasone (FLONASE) 50 MCG/ACT nasal spray Place 2 sprays into both nostrils as needed for allergies.     Marland Kitchen losartan (COZAAR) 100 MG tablet Take 100 mg by mouth daily.    . nicotine (NICODERM CQ - DOSED IN MG/24 HOURS) 14 mg/24hr patch Place 1 patch (14 mg total) onto the skin daily. 14 patch 0  . nicotine (NICODERM CQ - DOSED IN MG/24 HOURS) 21 mg/24hr patch Place 1 patch (21 mg total) onto the skin daily. 42 patch 0  . nicotine (NICODERM CQ - DOSED IN MG/24 HR) 7 mg/24hr patch Place 1 patch (7 mg total) onto the skin  daily. 14 patch 0  . Nicotine 21-14-7 MG/24HR KIT Apply 1 patch topically daily.     . nitroGLYCERIN (NITROSTAT) 0.4 MG SL tablet Place 1 tablet (0.4 mg total) under the tongue every 5 (five) minutes as needed for chest pain. 30 tablet 0  . potassium chloride SA (KLOR-CON) 20 MEQ tablet Take 20 mEq by mouth 3 (three) times daily as needed (Cramping).     . rivaroxaban (XARELTO) 20 MG TABS tablet Take 1 tablet (20 mg total) by mouth daily with supper. 90 tablet 3  . spironolactone (ALDACTONE) 25 MG tablet Take 25 mg by mouth daily.    Marland Kitchen torsemide (DEMADEX) 20 MG tablet Take 60 mg by mouth daily.    Marland Kitchen VICTOZA 18 MG/3ML SOPN Inject 1.8 mg into the skin at bedtime.      No current facility-administered medications for this visit.    Allergies:   Penicillins   Social History:  The patient  reports that she has been smoking cigarettes. She started smoking about 49 years ago. She has a 8.70 pack-year smoking history.  She has never used smokeless tobacco. She reports previous alcohol use. She reports that she does not use drugs.   Family History:  The patient's family history includes Cancer in her daughter and father; Diabetes in her brother and father; Drug abuse in her sister; Heart disease in her mother; Hypertension in her brother, father, and mother; Mesothelioma in her brother; Parkinson's disease in her father.    ROS:  Please see the history of present illness.   All other systems are personally reviewed and negative.    Exam:    Vital Signs:  BP 125/68   Pulse 76   Ht 5' 5"  (1.651 m)   Wt 276 lb (125.2 kg)   BMI 45.93 kg/m    Well sounding, alert and conversant   Labs/Other Tests and Data Reviewed:    Recent Labs: 01/03/2019: B Natriuretic Peptide 138.0 06/01/2019: ALT 13; TSH 0.567 07/06/2019: BUN 15; Creatinine, Ser 0.68; Hemoglobin 12.5; Platelets 347; Potassium 3.6; Sodium 136   Wt Readings from Last 3 Encounters:  09/15/19 276 lb (125.2 kg)  09/09/19 (!) 276 lb 8 oz (125.4 kg)  08/13/19 279 lb (126.6 kg)     Other studies personally reviewed: Additional studies/ records that were reviewed today include: echo, cath, AF clnic notes  Review of the above records today demonstrates: as above   ASSESSMENT & PLAN:    1.  Persistent afib The patient has symptomatic, recurrent persistent atrial fibrillation. she has failed medical therapy with multaq. Chads2vasc score is 3.  she is anticoagulated with xarelto . Therapeutic strategies for afib including medicine (tikosyn) and ablation were discussed in detail with the patient today. Risk, benefits, and alternatives to each approach were discussed.  As she is on Plavix and xarelto until September, I would lean towards trying tikosyn at this time.  If she fails tikosyn then we could stop plavix and proceed with ablation in the future. Lifestyle modification is essential for her to do well long term  2. Morbid obesity Body mass index  is 45.93 kg/m. As bove  3. CAD S/p PCI in march As per Dr Evette Georges note, continue plavix until September.  Ok to stop plavix in September  4. Bradycardia Stable without indication for pacing at this time No change required today  5. HTN Stable No change required today    Patient Risk:  after full review of this patients clinical status, I feel  that they are at moderate risk at this time.   Today, I have spent 20 minutes with the patient with telehealth technology discussing afib .    SignedThompson Grayer MD, [facility] 09/15/2019 11:16 AM   [facility] [address] [place] [address] [phone] (office) [phone] (fax)

## 2019-09-16 ENCOUNTER — Telehealth: Payer: Self-pay | Admitting: Pharmacist

## 2019-09-16 NOTE — Telephone Encounter (Signed)
Medication list reviewed in anticipation of upcoming Tikosyn initiation. Patient is not taking contraindicated medications. She is on one QTc prolonging medications. QTc should be monitored closely with Albuterol use.  Please ensure K is at least 4 and Mag at least 2 before starting Tikosyn and while on Tikosyn, especially due to torsemide use.  Patient is anticoagulated on Xarelto on the appropriate dose. Please ensure that patient has not missed any anticoagulation doses in the 3 weeks prior to Tikosyn initiation.   Patient will need to be counseled to avoid use of Benadryl while on Tikosyn and in the 2-3 days prior to Tikosyn initiation.

## 2019-09-17 ENCOUNTER — Other Ambulatory Visit (HOSPITAL_COMMUNITY): Payer: Medicaid Other

## 2019-09-17 ENCOUNTER — Other Ambulatory Visit (HOSPITAL_COMMUNITY)
Admission: RE | Admit: 2019-09-17 | Discharge: 2019-09-17 | Disposition: A | Discharge status: Home / Self Care | Payer: Medicaid Other | Source: Ambulatory Visit | Attending: Physician Assistant | Admitting: Physician Assistant

## 2019-09-17 ENCOUNTER — Other Ambulatory Visit: Payer: Self-pay

## 2019-09-17 DIAGNOSIS — Z01812 Encounter for preprocedural laboratory examination: Secondary | ICD-10-CM | POA: Insufficient documentation

## 2019-09-17 DIAGNOSIS — Z20822 Contact with and (suspected) exposure to covid-19: Secondary | ICD-10-CM | POA: Diagnosis not present

## 2019-09-17 LAB — SARS CORONAVIRUS 2 (TAT 6-24 HRS): SARS Coronavirus 2: NEGATIVE

## 2019-09-20 ENCOUNTER — Encounter (HOSPITAL_COMMUNITY): Payer: Self-pay | Admitting: Physician Assistant

## 2019-09-20 ENCOUNTER — Ambulatory Visit (HOSPITAL_COMMUNITY)
Admission: RE | Admit: 2019-09-20 | Discharge: 2019-09-20 | Disposition: A | Discharge status: Home / Self Care | Payer: Medicaid Other | Source: Ambulatory Visit | Attending: Physician Assistant | Admitting: Physician Assistant

## 2019-09-20 ENCOUNTER — Other Ambulatory Visit: Payer: Self-pay

## 2019-09-20 ENCOUNTER — Inpatient Hospital Stay (HOSPITAL_COMMUNITY)
Admission: AD | Admit: 2019-09-20 | Discharge: 2019-09-23 | DRG: 309 | Disposition: A | Discharge status: Home / Self Care | Payer: Medicaid Other | Source: Ambulatory Visit | Attending: Internal Medicine | Admitting: Internal Medicine

## 2019-09-20 ENCOUNTER — Encounter (HOSPITAL_COMMUNITY): Payer: Self-pay | Admitting: Internal Medicine

## 2019-09-20 VITALS — BP 110/64 | HR 62 | Ht 65.0 in | Wt 278.4 lb

## 2019-09-20 DIAGNOSIS — E669 Obesity, unspecified: Secondary | ICD-10-CM | POA: Diagnosis present

## 2019-09-20 DIAGNOSIS — Z9884 Bariatric surgery status: Secondary | ICD-10-CM | POA: Diagnosis not present

## 2019-09-20 DIAGNOSIS — Z6841 Body Mass Index (BMI) 40.0 and over, adult: Secondary | ICD-10-CM

## 2019-09-20 DIAGNOSIS — I4819 Other persistent atrial fibrillation: Secondary | ICD-10-CM | POA: Diagnosis present

## 2019-09-20 DIAGNOSIS — D6869 Other thrombophilia: Secondary | ICD-10-CM | POA: Diagnosis present

## 2019-09-20 DIAGNOSIS — F17210 Nicotine dependence, cigarettes, uncomplicated: Secondary | ICD-10-CM | POA: Diagnosis present

## 2019-09-20 DIAGNOSIS — I10 Essential (primary) hypertension: Secondary | ICD-10-CM | POA: Diagnosis present

## 2019-09-20 DIAGNOSIS — Z833 Family history of diabetes mellitus: Secondary | ICD-10-CM | POA: Diagnosis not present

## 2019-09-20 DIAGNOSIS — Z7901 Long term (current) use of anticoagulants: Secondary | ICD-10-CM | POA: Diagnosis not present

## 2019-09-20 DIAGNOSIS — E785 Hyperlipidemia, unspecified: Secondary | ICD-10-CM | POA: Diagnosis present

## 2019-09-20 DIAGNOSIS — E119 Type 2 diabetes mellitus without complications: Secondary | ICD-10-CM | POA: Diagnosis present

## 2019-09-20 DIAGNOSIS — Z8249 Family history of ischemic heart disease and other diseases of the circulatory system: Secondary | ICD-10-CM

## 2019-09-20 DIAGNOSIS — I252 Old myocardial infarction: Secondary | ICD-10-CM | POA: Diagnosis not present

## 2019-09-20 DIAGNOSIS — Z79899 Other long term (current) drug therapy: Secondary | ICD-10-CM | POA: Diagnosis not present

## 2019-09-20 DIAGNOSIS — R9431 Abnormal electrocardiogram [ECG] [EKG]: Secondary | ICD-10-CM | POA: Diagnosis present

## 2019-09-20 DIAGNOSIS — Z20822 Contact with and (suspected) exposure to covid-19: Secondary | ICD-10-CM | POA: Diagnosis present

## 2019-09-20 DIAGNOSIS — Z9861 Coronary angioplasty status: Secondary | ICD-10-CM

## 2019-09-20 DIAGNOSIS — I493 Ventricular premature depolarization: Secondary | ICD-10-CM | POA: Diagnosis present

## 2019-09-20 DIAGNOSIS — I2510 Atherosclerotic heart disease of native coronary artery without angina pectoris: Secondary | ICD-10-CM | POA: Diagnosis present

## 2019-09-20 DIAGNOSIS — Z880 Allergy status to penicillin: Secondary | ICD-10-CM

## 2019-09-20 LAB — MAGNESIUM: Magnesium: 2.1 mg/dL (ref 1.7–2.4)

## 2019-09-20 LAB — BASIC METABOLIC PANEL
Anion gap: 8 (ref 5–15)
BUN: 13 mg/dL (ref 8–23)
CO2: 26 mmol/L (ref 22–32)
Calcium: 9.8 mg/dL (ref 8.9–10.3)
Chloride: 102 mmol/L (ref 98–111)
Creatinine, Ser: 0.78 mg/dL (ref 0.44–1.00)
GFR calc Af Amer: >60 mL/min (ref >60)
GFR calc non Af Amer: >60 mL/min (ref >60)
Glucose, Bld: 145 mg/dL — ABNORMAL HIGH (ref 70–99)
Potassium: 5 mmol/L (ref 3.5–5.1)
Sodium: 136 mmol/L (ref 135–145)

## 2019-09-20 MED ORDER — SODIUM CHLORIDE 0.9 % IV SOLN: 250.0000 mL | INTRAVENOUS | Status: DC | PRN

## 2019-09-20 MED ORDER — RIVAROXABAN 20 MG PO TABS
20.0000 mg | ORAL_TABLET | Freq: Every day | ORAL | Status: DC
  Administered 2019-09-20 – 2019-09-22 (×3): 20 mg via ORAL
  Filled 2019-09-20 (×3): qty 1

## 2019-09-20 MED ORDER — SPIRONOLACTONE 25 MG PO TABS
25.0000 mg | ORAL_TABLET | Freq: Every day | ORAL | Status: DC
  Administered 2019-09-21 – 2019-09-23 (×3): 25 mg via ORAL
  Filled 2019-09-20 (×3): qty 1

## 2019-09-20 MED ORDER — ALBUTEROL SULFATE (2.5 MG/3ML) 0.083% IN NEBU: 3.0000 mL | INHALATION_SOLUTION | Freq: Four times a day (QID) | RESPIRATORY_TRACT | Status: DC | PRN

## 2019-09-20 MED ORDER — ATORVASTATIN CALCIUM 80 MG PO TABS
80.0000 mg | ORAL_TABLET | Freq: Every day | ORAL | Status: DC
  Administered 2019-09-20 – 2019-09-22 (×3): 80 mg via ORAL
  Filled 2019-09-20 (×3): qty 1

## 2019-09-20 MED ORDER — AMLODIPINE BESYLATE 5 MG PO TABS
5.0000 mg | ORAL_TABLET | Freq: Every day | ORAL | Status: DC
  Administered 2019-09-21 – 2019-09-23 (×3): 5 mg via ORAL
  Filled 2019-09-20 (×3): qty 1

## 2019-09-20 MED ORDER — DOFETILIDE 500 MCG PO CAPS
500.0000 ug | ORAL_CAPSULE | Freq: Two times a day (BID) | ORAL | Status: DC
  Administered 2019-09-20 – 2019-09-21 (×2): 500 ug via ORAL
  Filled 2019-09-20 (×2): qty 1

## 2019-09-20 MED ORDER — TORSEMIDE 20 MG PO TABS
60.0000 mg | ORAL_TABLET | Freq: Every day | ORAL | Status: DC
  Administered 2019-09-20 – 2019-09-23 (×4): 60 mg via ORAL
  Filled 2019-09-20 (×4): qty 3

## 2019-09-20 MED ORDER — LIRAGLUTIDE 18 MG/3ML ~~LOC~~ SOPN: 1.8000 mg | PEN_INJECTOR | Freq: Every day | SUBCUTANEOUS | Status: DC

## 2019-09-20 MED ORDER — SODIUM CHLORIDE 0.9% FLUSH: 3.0000 mL | INTRAVENOUS | Status: DC | PRN

## 2019-09-20 MED ORDER — LORATADINE 10 MG PO TABS
10.0000 mg | ORAL_TABLET | Freq: Every day | ORAL | Status: DC
  Filled 2019-09-20 (×3): qty 1

## 2019-09-20 MED ORDER — CLOPIDOGREL BISULFATE 75 MG PO TABS
75.0000 mg | ORAL_TABLET | Freq: Every day | ORAL | Status: DC
  Administered 2019-09-21 – 2019-09-23 (×3): 75 mg via ORAL
  Filled 2019-09-20 (×3): qty 1

## 2019-09-20 MED ORDER — SODIUM CHLORIDE 0.9% FLUSH
3.0000 mL | Freq: Two times a day (BID) | INTRAVENOUS | Status: DC
  Administered 2019-09-20 – 2019-09-23 (×5): 3 mL via INTRAVENOUS

## 2019-09-20 MED ORDER — POTASSIUM CHLORIDE CRYS ER 20 MEQ PO TBCR: 20.0000 meq | EXTENDED_RELEASE_TABLET | Freq: Three times a day (TID) | ORAL | Status: DC

## 2019-09-20 MED ORDER — LOSARTAN POTASSIUM 50 MG PO TABS
100.0000 mg | ORAL_TABLET | Freq: Every day | ORAL | Status: DC
  Administered 2019-09-21 – 2019-09-23 (×3): 100 mg via ORAL
  Filled 2019-09-20 (×3): qty 2

## 2019-09-20 MED ORDER — NITROGLYCERIN 0.4 MG SL SUBL: 0.4000 mg | SUBLINGUAL_TABLET | SUBLINGUAL | Status: DC | PRN

## 2019-09-20 MED ORDER — CYANOCOBALAMIN 500 MCG PO TABS
1500.0000 ug | ORAL_TABLET | Freq: Every day | ORAL | Status: DC
  Administered 2019-09-21 – 2019-09-23 (×3): 1500 ug via ORAL
  Filled 2019-09-20 (×3): qty 3

## 2019-09-20 NOTE — Progress Notes (Signed)
Pharmacy Review for Dofetilide (Tikosyn) Initiation  Admit Complaint: 64 y.o. female admitted 09/20/2019 with atrial fibrillation to be initiated on dofetilide.   Assessment: Patient Exclusion Criteria: If any screening criteria checked as "Yes", then  patient  should NOT receive dofetilide until criteria item is corrected. If "Yes" please indicate correction plan.  YES  NO Patient  Exclusion Criteria Correction Plan  []  [x]  Baseline QTc interval is greater than or equal to 440 msec. IF above YES box checked dofetilide contraindicated unless patient has ICD; then may proceed if QTc 500-550 msec or with known ventricular conduction abnormalities may proceed with QTc 550-600 msec. QTc =  368   []  [x]  Magnesium level is less than 1.8 mEq/l : Last magnesium: 2.1 Lab Results  Component Value Date   MG 2.1 09/20/2019         []  [x]  Potassium level is less than 4 mEq/l : Last potassium: 5.0 Lab Results  Component Value Date   K 5.0 09/20/2019         []  [x]  Patient is known or suspected to have a digoxin level greater than 2 ng/ml: No results found for: DIGOXIN    []  [x]  Creatinine clearance less than 20 ml/min (calculated using Cockcroft-Gault, actual body weight and serum creatinine): Estimated Creatinine Clearance: 94.9 mL/min (by C-G formula based on SCr of 0.78 mg/dL).    []  [x]  Patient has received drugs known to prolong the QT intervals within the last 48 hours (phenothiazines, tricyclics or tetracyclic antidepressants, erythromycin, H-1 antihistamines, cisapride, fluoroquinolones, azithromycin). Drugs not listed above may have an, as yet, undetected potential to prolong the QT interval, updated information on QT prolonging agents is available at this website:QT prolonging agents   []  [x]  Patient received a dose of hydrochlorothiazide (Oretic) alone or in any combination including triamterene (Dyazide, Maxzide) in the last 48 hours.   []  [x]  Patient received a medication known to  increase dofetilide plasma concentrations prior to initial dofetilide dose:  . Trimethoprim (Primsol, Proloprim) in the last 36 hours . Verapamil (Calan, Verelan) in the last 36 hours or a sustained release dose in the last 72 hours . Megestrol (Megace) in the last 5 days  . Cimetidine (Tagamet) in the last 6 hours . Ketoconazole (Nizoral) in the last 24 hours . Itraconazole (Sporanox) in the last 48 hours  . Prochlorperazine (Compazine) in the last 36 hours    []  [x]  Patient is known to have a history of torsades de pointes; congenital or acquired long QT syndromes.   []  [x]  Patient has received a Class 1 antiarrhythmic with less than 2 half-lives since last dose. (Disopyramide, Quinidine, Procainamide, Lidocaine, Mexiletine, Flecainide, Propafenone)   []  [x]  Patient has received amiodarone therapy in the past 3 months or amiodarone level is greater than 0.3 ng/ml.    Patient has been appropriately anticoagulated with Xarelto 20 mg daily.  Ordering provider was confirmed at [email or web address] if they are not listed on the [place] Prescribers list.  Goal of Therapy: Follow renal function, electrolytes, potential drug interactions, and dose adjustment. Provide education and 1 week supply at discharge.  Plan:  [x]   Physician selected initial dose within range recommended for patients level of renal function - will monitor for response.  []   Physician selected initial dose outside of range recommended for patients level of renal function - will discuss if the dose should be altered at this time.   Select One Calculated CrCl  Dose q12h  [x]  > 60 ml/min 500  mcg  []  40-60 ml/min 250 mcg  []  20-40 ml/min 125 mcg   2. Follow up QTc after the first 5 doses, renal function, electrolytes (K & Mg) daily x 3 days, dose adjustment, success of initiation and facilitate 1 week discharge supply as clinically indicated.  3. Initiate Tikosyn education video (Call [phone] and ask for video #  116).  4. Place Enrollment Form on the chart for discharge supply of dofetilide.  Vertis Kelch, PharmD, BCPS Phone [phone] 09/20/2019       2:27 PM  Please check [email or web address] for unit-specific pharmacist phone numbers

## 2019-09-20 NOTE — H&P (Signed)
Primary Care Physician: Amy Noble, MD Primary Cardiologist: Dr Harl Bowie Primary Electrophysiologist: Dr Rayann Heman Referring Physician: Dr Serita Kyle is a 65 y.o. female with a history of CAD, HTN, HLD, left iliac thrombosis on Xarelto, type 2 diabetes, tobacco abuse, persistent atrial fibrillation (CHA2DS2-VASc score 3), NSTEMI 04/25/2019 (DES to proximal RCA) who presents for follow up in the [facility]. The patient was initially diagnosed with atrial fibrillation in the setting of her NSTEMI. Patient is on Xarelto for a CHADS2VASC score of 3. Patient underwent DCCV on 06/03/19 but unfortunately was back in afib by her follow up on 06/22/19. Patient continues to have symptoms of fatigue despite good rate control. She denies any snoring or significant alcohol use. There were no specific triggers that the patient could identify. She denies bleeding issues on anticoagulation. She wore a Zio patch which did not show any significant bradycardia, block, or pauses. She did have a 67% afib burden however. Multaq was discontinued.   On follow up today, patient presents for dofetilide admission. She is in afib with slow response today with continued symptoms of fatigue. She denies any missed doses of anticoagulation in the last 3 weeks.    Today, she denies symptoms of palpitations, chest pain, shortness of breath, orthopnea, PND, lower extremity edema, dizziness, presyncope, syncope, snoring, daytime somnolence, bleeding, or neurologic sequela. The patient is tolerating medications without difficulties and is otherwise without complaint today.      Atrial Fibrillation Risk Factors:   she does not have symptoms or diagnosis of sleep apnea. she does not have a history of rheumatic fever. she does not have a history of alcohol use. The patient does not have a history of early familial atrial fibrillation or other arrhythmias.   she has a BMI of Body mass index  is 46.33 kg/m.Marland Kitchen    Filed Weights    09/20/19 1128  Weight: 126.3 kg           Family History  Problem Relation Age of Onset   Diabetes Father     Hypertension Father     Parkinson's disease Father     Cancer Father          not sure what kind   Hypertension Mother     Heart disease Mother     Drug abuse Sister     Mesothelioma Brother     Cancer Daughter          non-hodgkins lmphoma   Hypertension Brother     Diabetes Brother     Colon cancer Neg Hx     Gastric cancer Neg Hx     Esophageal cancer Neg Hx          Atrial Fibrillation Management history:   Previous antiarrhythmic drugs: Multaq Previous cardioversions: 06/03/19, 07/13/19 Previous ablations: none CHADS2VASC score: 3 Anticoagulation history: Xarelto         Past Medical History:  Diagnosis Date   Arthritis     Asthma     Cancer ([place])      multiple skin cancers   Diabetes mellitus without complication ([place])     Diverticulosis     Fluid retention     GERD (gastroesophageal reflux disease)     High cholesterol     Hypertension     Incomplete rotator cuff tear     Myocardial infarction ([place])     Obesity     Persistent atrial fibrillation ([place])  PONV (postoperative nausea and vomiting)     Wears glasses     Wears partial dentures      bottom         Past Surgical History:  Procedure Laterality Date   APPENDECTOMY       CARDIOVERSION N/A 06/03/2019    Procedure: CARDIOVERSION;  Surgeon: Herminio Commons, MD;  Location: [facility];  Service: Cardiovascular;  Laterality: N/A;   CARDIOVERSION N/A 07/13/2019    Procedure: CARDIOVERSION;  Surgeon: Geralynn Rile, MD;  Location: [place];  Service: Cardiovascular;  Laterality: N/A;   CARPAL TUNNEL RELEASE Bilateral 2001    bilateral   CERVICAL POLYPECTOMY   04/08/2017    Procedure: POLYPECTOMY;  Surgeon: [name], MD;  Location: [facility];  Service: Gynecology;;  Endometrial   CHOLECYSTECTOMY       COLONOSCOPY   2007     [id number] internal hemorrhoids. Diminutive rectal polyp at 10 cm, cold biopsied/removed. The remainder of the rectal mucosa appeared normal Swallow left-sided diverticula. Diminutive polyp at the splenic flexure cold biopsied/removed (adenomatous)   COLONOSCOPY N/A 12/28/2013    Procedure: COLONOSCOPY;  Surgeon: Daneil Dolin, MD;  [facility];  Service: Endoscopy;  Laterality: N/A;  730 - moved to 8:30 - Ginger notified pt   CORONARY STENT INTERVENTION N/A 04/27/2019    Procedure: CORONARY STENT INTERVENTION;  Surgeon: Troy Sine, MD;  Location: [place] [facility];  Service: Cardiovascular;  Laterality: N/A;   DILATION AND CURETTAGE, DIAGNOSTIC / THERAPEUTIC   03/07/2017   ERCP   2002   GASTRIC BYPASS   2004   HYSTEROSCOPY WITH D & C N/A 04/08/2017    Procedure: DILATATION AND CURETTAGE /HYSTEROSCOPY;  Surgeon: [name], MD;  Location: [facility];  Service: Gynecology;  Laterality: N/A;   INTRAVASCULAR ULTRASOUND/IVUS N/A 09/12/2017    Procedure: INTRAVASCULAR ULTRASOUND/IVUS;  Surgeon: Elam Dutch, MD;  Location: [facility];  Service: Cardiovascular;  Laterality: N/A;   LEFT HEART CATH AND CORONARY ANGIOGRAPHY N/A 04/27/2019    Procedure: LEFT HEART CATH AND CORONARY ANGIOGRAPHY;  Surgeon: Troy Sine, MD;  Location: [place];  Service: Cardiovascular;  Laterality: N/A;   NECK SURGERY   1998    fusion   PERCUTANEOUS VENOUS THROMBECTOMY,LYSIS WITH INTRAVASCULAR ULTRASOUND (IVUS) Left 09/13/2017    Procedure: REMOVAL OF LYSIS CATHETER AND INTRAVASCULAR ULTRASOUND (IVUS) LEFT ILIAC VEIN;  Surgeon: Elam Dutch, MD;  Location: [place];  Service: Vascular;  Laterality: Left;   PERIPHERAL VASCULAR INTERVENTION   09/15/2017    Procedure: PERIPHERAL VASCULAR INTERVENTION;  Surgeon: Waynetta Sandy, MD;  Location: [place];  Service: Cardiovascular;;  VEINOUS/STENT   PERIPHERAL VASCULAR THROMBECTOMY Left 09/12/2017    Procedure: PERIPHERAL  VASCULAR THROMBECTOMY;  Surgeon: Elam Dutch, MD;  Location: [facility];  Service: Cardiovascular;  Laterality: Left;   PERIPHERAL VASCULAR THROMBECTOMY Left 09/15/2017    Procedure: PERIPHERAL VASCULAR THROMBECTOMY;  Surgeon: Waynetta Sandy, MD;  Location: [place] [facility];  Service: Cardiovascular;  Laterality: Left;  IVC TO LT FEM/POP VEIN   SHOULDER ARTHROSCOPY WITH SUBACROMIAL DECOMPRESSION Left 06/23/2013    Procedure: LEFT SHOULDER ARTHROSCOPY WITH DEBRIDEMENT ROTATOR CUFF AND LABRUM;  Surgeon: Lorn Junes, MD;  Location: [place];  Service: Orthopedics;  Laterality: Left;   SHOULDER SURGERY   1999    left            Current Outpatient Medications  Medication Sig Dispense Refill   albuterol (VENTOLIN HFA) 108 (90 Base) MCG/ACT inhaler Inhale 2 puffs into the lungs every 6 (six) hours as needed for wheezing or shortness of breath.       amLODipine (NORVASC) 5 MG tablet Take 5 mg by mouth daily.       atorvastatin (LIPITOR) 80 MG tablet Take 1 tablet (80 mg total) by mouth daily at 6 PM. 90 tablet 1   BD PEN NEEDLE NANO U/F 32G X 4 MM MISC USE FOR VICTOZA INJECTIONS       cetirizine (ZYRTEC) 10 MG tablet Take 10 mg by mouth at bedtime.        clopidogrel (PLAVIX) 75 MG tablet Take 1 tablet (75 mg total) by mouth daily with breakfast. 90 tablet 3   Cyanocobalamin (B-12) 5000 MCG CAPS Take 1,500 mcg by mouth daily.        diphenhydrAMINE (BENADRYL) 2 % cream Apply 1 application topically as needed for itching. For itchy palms        fluticasone (FLONASE) 50 MCG/ACT nasal spray Place 2 sprays into both nostrils as needed for allergies.        losartan (COZAAR) 100 MG tablet Take 100 mg by mouth daily.       nitroGLYCERIN (NITROSTAT) 0.4 MG SL tablet Place 1 tablet (0.4 mg total) under the tongue every 5 (five) minutes as needed for chest pain. 30 tablet 0   potassium chloride SA (KLOR-CON) 20 MEQ tablet Take 55mq in the AM 249m at lunch and  4053min the PM       rivaroxaban (XARELTO) 20 MG TABS tablet Take 1 tablet (20 mg total) by mouth daily with supper. 90 tablet 3   spironolactone (ALDACTONE) 25 MG tablet Take 25 mg by mouth daily.       torsemide (DEMADEX) 20 MG tablet Take 60 mg by mouth daily.       VICTOZA 18 MG/3ML SOPN Inject 1.8 mg into the skin at bedtime.        nicotine (NICODERM CQ - DOSED IN MG/24 HOURS) 14 mg/24hr patch Place 1 patch (14 mg total) onto the skin daily. (Patient not taking: Reported on 09/20/2019) 14 patch 0   nicotine (NICODERM CQ - DOSED IN MG/24 HOURS) 21 mg/24hr patch Place 1 patch (21 mg total) onto the skin daily. (Patient not taking: Reported on 09/20/2019) 42 patch 0   nicotine (NICODERM CQ - DOSED IN MG/24 HR) 7 mg/24hr patch Place 1 patch (7 mg total) onto the skin daily. (Patient not taking: Reported on 09/20/2019) 14 patch 0   Nicotine 21-14-7 MG/24HR KIT Apply 1 patch topically daily.  (Patient not taking: Reported on 09/20/2019)        No current facility-administered medications for this encounter.           Allergies  Allergen Reactions   Penicillins Hives and Rash      Has patient had a PCN reaction causing immediate rash, facial/tongue/throat swelling, SOB or lightheadedness with hypotension: Yes Has patient had a PCN reaction causing severe rash involving mucus membranes or skin necrosis: No Has patient had a PCN reaction that required hospitalization No Has patient had a PCN reaction occurring within the last 10 years: No If all of the above answers are "NO", then may proceed with Cephalosporin use.        Social History         Socioeconomic History   Marital status: Widowed      Spouse  name: Not on file   Number of children: 2   Years of education: Not on file   Highest education level: Not on file  Occupational History   Occupation: works at [employer] store      Employer: [employer]   Tobacco Use   Smoking status: Current Every Day Smoker      Packs/day: 0.30      Years: 29.00       Pack years: 8.70      Types: Cigarettes      Start date: 04/09/1970   Smokeless tobacco: Never Used   Tobacco comment: 5 cigarettes a day 09/20/19  Vaping Use   Vaping Use: Never used  Substance and Sexual Activity   Alcohol use: Not Currently   Drug use: No   Sexual activity: Not Currently      Birth control/protection: Post-menopausal  Other Topics Concern   Not on file  Social History Narrative   Not on file    Social Determinants of Health       Financial Resource Strain:    Difficulty of Paying Living Expenses:   Food Insecurity:    Worried About Charity fundraiser in the Last Year:    Arboriculturist in the Last Year:   Transportation Needs:    Film/video editor (Medical):    Lack of Transportation (Non-Medical):   Physical Activity:    Days of Exercise per Week:    Minutes of Exercise per Session:   Stress:    Feeling of Stress :   Social Connections:    Frequency of Communication with Friends and Family:    Frequency of Social Gatherings with Friends and Family:    Attends Religious Services:    Active Member of Clubs or Organizations:    Attends Music therapist:    Marital Status:   Intimate Partner Violence:    Fear of Current or Ex-Partner:    Emotionally Abused:    Physically Abused:    Sexually Abused:         ROS- All systems are reviewed and negative except as per the HPI above.   Physical Exam:    Vitals:    09/20/19 1128  BP: 110/64  Pulse: 62  SpO2: 97%  Weight: 126.3 kg  Height: '5\' 5"'$  (1.651 m)      GEN- The patient is well appearing obese female, alert and oriented x 3 today.   HEENT-head normocephalic, atraumatic, sclera clear, conjunctiva pink, hearing intact, trachea midline. Lungs- Clear to ausculation bilaterally, normal work of breathing Heart- irregular rate and rhythm, bradycardia, no murmurs, rubs or gallops  GI- soft, NT, ND, + BS Extremities- no clubbing, cyanosis, or edema MS- no significant  deformity or atrophy Skin- no rash or lesion Psych- euthymic mood, full affect Neuro- strength and sensation are intact        Wt Readings from Last 3 Encounters:  09/20/19 126.3 kg  09/15/19 125.2 kg  09/09/19 (!) 125.4 kg      EKG today demonstrates afib HR 48, QRS 78, QTc 368   Echo 04/28/19 demonstrated   1. Left ventricular ejection fraction, by estimation, is 55 to 60%. Left  ventricular ejection fraction by 3D volume is 58 %. The left ventricle has  normal function. The left ventricle has no regional wall motion  abnormalities. The left ventricular  internal cavity size was mildly dilated. There is mild left ventricular  hypertrophy. Left ventricular diastolic parameters are indeterminate.   2.  Right ventricular systolic function is normal. The right ventricular  size is mildly enlarged. There is mildly elevated pulmonary artery  systolic pressure. The estimated right ventricular systolic pressure is  14.8 mmHg.   3. The mitral valve is normal in structure. Mild mitral valve  regurgitation.   4. The aortic valve is tricuspid. Aortic valve regurgitation is not  visualized. Mild aortic stenosis. Vmax 2.8 m/s, MG 13mHg, AVA 1.4 cm^2,  DI 0.55   5. Aortic dilatation noted. There is mild dilatation of the ascending  aorta measuring 36 mm.   6. The inferior vena cava is dilated in size with <50% respiratory  variability, suggesting right atrial pressure of 15 mmHg.    Epic records are reviewed at length today   CHA2DS2-VASc Score = 3  The patient's score is based upon: CHF History: 0 HTN History: 1 Age : 0 Diabetes History: 0 Stroke History: 0 Vascular Disease History: 1 Gender: 1        ASSESSMENT AND PLAN: 1. Persistent Atrial Fibrillation (ICD10:  I48.19) Patient wants to pursue dofetilide, aware of risk vrs benefit Aware of price of dofetilide. No benadryl use PharmD has screened drugs and no contraindicated medications on board QTc in SR 422 ms, Labs  today show creatinine at 0.78, K+ 5.0 and mag 2.1, CrCl calculated at 145 mL/min Continue Xarelto 20 mg daily. Patient denies any missed doses in the last 3 weeks.    2. Secondary Hypercoagulable State (ICD10:  D68.69) The patient is at significant risk for stroke/thromboembolism based upon her CHA2DS2-VASc Score of 3.  Continue Rivaroxaban (Xarelto).    3. Obesity Body mass index is 46.33 kg/m. Lifestyle modification was discussed and encouraged including regular physical activity and weight reduction.   4. CAD S/p PCI to RCA 04/27/19. No anginal symptoms.   5. HTN Stable, no changes today.     To be admitted later today once a bed becomes available.      [facility] [address]GC[facility] [id number] [phone]    ------------------------------------------------------------------------------------------------------------- I have seen, examined the patient, and reviewed the above assessment and plan.    Briefly, Ms WGreensteinis a [id number] with atrial fibrillation who is being admitted for dofetilde loading. Her QTc is appropriate today for her initial loading dose. Renal function is  0.78. Exam findings are significant for significant lower extremity swelling for which she takes torsemide and intermittently uses compression stockings. We will continue the torsemide while inpatient and she has agreed to use compression stockings. May need to adjust her diuretic regimen.   CVickie Epley MD 09/20/2019 2:44 PM

## 2019-09-20 NOTE — Progress Notes (Signed)
Primary Care Physician: Asencion Noble, MD Primary Cardiologist: Dr Harl Bowie Primary Electrophysiologist: Dr Rayann Heman Referring Physician: Dr Serita Kyle is a 64 y.o. female with a history of CAD, HTN, HLD, left iliac thrombosis on Xarelto, type 2 diabetes, tobacco abuse, persistent atrial fibrillation (CHA2DS2-VASc score 3), NSTEMI 04/25/2019 (DES to proximal RCA) who presents for follow up in the [facility]. The patient was initially diagnosed with atrial fibrillation in the setting of her NSTEMI. Patient is on Xarelto for a CHADS2VASC score of 3. Patient underwent DCCV on 06/03/19 but unfortunately was back in afib by her follow up on 06/22/19. Patient continues to have symptoms of fatigue despite good rate control. She denies any snoring or significant alcohol use. There were no specific triggers that the patient could identify. She denies bleeding issues on anticoagulation. She wore a Zio patch which did not show any significant bradycardia, block, or pauses. She did have a 67% afib burden however. Multaq was discontinued.  On follow up today, patient presents for dofetilide admission. She is in afib with slow response today with continued symptoms of fatigue. She denies any missed doses of anticoagulation in the last 3 weeks.   Today, she denies symptoms of palpitations, chest pain, shortness of breath, orthopnea, PND, lower extremity edema, dizziness, presyncope, syncope, snoring, daytime somnolence, bleeding, or neurologic sequela. The patient is tolerating medications without difficulties and is otherwise without complaint today.    Atrial Fibrillation Risk Factors:  she does not have symptoms or diagnosis of sleep apnea. she does not have a history of rheumatic fever. she does not have a history of alcohol use. The patient does not have a history of early familial atrial fibrillation or other arrhythmias.  she has a BMI of Body mass index is 46.33  kg/m.Marland Kitchen Filed Weights   09/20/19 1128  Weight: 126.3 kg    Family History  Problem Relation Age of Onset  . Diabetes Father   . Hypertension Father   . Parkinson's disease Father   . Cancer Father        not sure what kind  . Hypertension Mother   . Heart disease Mother   . Drug abuse Sister   . Mesothelioma Brother   . Cancer Daughter        non-hodgkins lmphoma  . Hypertension Brother   . Diabetes Brother   . Colon cancer Neg Hx   . Gastric cancer Neg Hx   . Esophageal cancer Neg Hx      Atrial Fibrillation Management history:  Previous antiarrhythmic drugs: Multaq Previous cardioversions: 06/03/19, 07/13/19 Previous ablations: none CHADS2VASC score: 3 Anticoagulation history: Xarelto   Past Medical History:  Diagnosis Date  . Arthritis   . Asthma   . Cancer [facility])    multiple skin cancers  . Diabetes mellitus without complication ([place])   . Diverticulosis   . Fluid retention   . GERD (gastroesophageal reflux disease)   . High cholesterol   . Hypertension   . Incomplete rotator cuff tear   . Myocardial infarction ([place])   . Obesity   . Persistent atrial fibrillation ([place])   . PONV (postoperative nausea and vomiting)   . Wears glasses   . Wears partial dentures    bottom   Past Surgical History:  Procedure Laterality Date  . APPENDECTOMY    . CARDIOVERSION N/A 06/03/2019   Procedure: CARDIOVERSION;  Surgeon: Herminio Commons, MD;  Location: [facility];  Service: Cardiovascular;  Laterality:  N/A;  . CARDIOVERSION N/A 07/13/2019   Procedure: CARDIOVERSION;  Surgeon: Geralynn Rile, MD;  Location: [place];  Service: Cardiovascular;  Laterality: N/A;  . CARPAL TUNNEL RELEASE Bilateral 2001   bilateral  . CERVICAL POLYPECTOMY  04/08/2017   Procedure: POLYPECTOMY;  Surgeon: [name], MD;  Location: [facility];  Service: Gynecology;;  Endometrial  . CHOLECYSTECTOMY    . COLONOSCOPY  2007   RVU:YEBXIDH internal hemorrhoids. Diminutive rectal polyp  at 10 cm, cold biopsied/removed. The remainder of the rectal mucosa appeared normal Swallow left-sided diverticula. Diminutive polyp at the splenic flexure cold biopsied/removed (adenomatous)  . COLONOSCOPY N/A 12/28/2013   Procedure: COLONOSCOPY;  Surgeon: Daneil Dolin, MD;  Location: [facility];  Service: Endoscopy;  Laterality: N/A;  730 - moved to 8:30 - Ginger notified pt  . CORONARY STENT INTERVENTION N/A 04/27/2019   Procedure: CORONARY STENT INTERVENTION;  Surgeon: Troy Sine, MD;  Location: [place] [facility];  Service: Cardiovascular;  Laterality: N/A;  . DILATION AND CURETTAGE, DIAGNOSTIC / THERAPEUTIC  03/07/2017  . ERCP  2002  . GASTRIC BYPASS  2004  . HYSTEROSCOPY WITH D & C N/A 04/08/2017   Procedure: DILATATION AND CURETTAGE /HYSTEROSCOPY;  Surgeon: [name], MD;  Location: [facility];  Service: Gynecology;  Laterality: N/A;  . INTRAVASCULAR ULTRASOUND/IVUS N/A 09/12/2017   Procedure: INTRAVASCULAR ULTRASOUND/IVUS;  Surgeon: Elam Dutch, MD;  Location: [place] [facility];  Service: Cardiovascular;  Laterality: N/A;  . LEFT HEART CATH AND CORONARY ANGIOGRAPHY N/A 04/27/2019   Procedure: LEFT HEART CATH AND CORONARY ANGIOGRAPHY;  Surgeon: Troy Sine, MD;  Location: [place] [facility];  Service: Cardiovascular;  Laterality: N/A;  . NECK SURGERY  1998   fusion  . PERCUTANEOUS VENOUS THROMBECTOMY,LYSIS WITH INTRAVASCULAR ULTRASOUND (IVUS) Left 09/13/2017   Procedure: REMOVAL OF LYSIS CATHETER AND INTRAVASCULAR ULTRASOUND (IVUS) LEFT ILIAC VEIN;  Surgeon: Elam Dutch, MD;  Location: [place];  Service: Vascular;  Laterality: Left;  . PERIPHERAL VASCULAR INTERVENTION  09/15/2017   Procedure: PERIPHERAL VASCULAR INTERVENTION;  Surgeon: Waynetta Sandy, MD;  Location: [place] [facility];  Service: Cardiovascular;;  VEINOUS/STENT  . PERIPHERAL VASCULAR THROMBECTOMY Left 09/12/2017   Procedure: PERIPHERAL VASCULAR THROMBECTOMY;  Surgeon: Elam Dutch, MD;   Location: [place] [facility];  Service: Cardiovascular;  Laterality: Left;  . PERIPHERAL VASCULAR THROMBECTOMY Left 09/15/2017   Procedure: PERIPHERAL VASCULAR THROMBECTOMY;  Surgeon: Waynetta Sandy, MD;  Location: [place] [facility];  Service: Cardiovascular;  Laterality: Left;  IVC TO LT FEM/POP VEIN  . SHOULDER ARTHROSCOPY WITH SUBACROMIAL DECOMPRESSION Left 06/23/2013   Procedure: LEFT SHOULDER ARTHROSCOPY WITH DEBRIDEMENT ROTATOR CUFF AND LABRUM;  Surgeon: Lorn Junes, MD;  Location: [place];  Service: Orthopedics;  Laterality: Left;  . SHOULDER SURGERY  1999   left    Current Outpatient Medications  Medication Sig Dispense Refill  . albuterol (VENTOLIN HFA) 108 (90 Base) MCG/ACT inhaler Inhale 2 puffs into the lungs every 6 (six) hours as needed for wheezing or shortness of breath.    Marland Kitchen amLODipine (NORVASC) 5 MG tablet Take 5 mg by mouth daily.    Marland Kitchen atorvastatin (LIPITOR) 80 MG tablet Take 1 tablet (80 mg total) by mouth daily at 6 PM. 90 tablet 1  . BD PEN NEEDLE NANO U/F 32G X 4 MM MISC USE FOR VICTOZA INJECTIONS    . cetirizine (ZYRTEC) 10 MG tablet Take 10 mg by mouth at bedtime.     Marland Kitchen  clopidogrel (PLAVIX) 75 MG tablet Take 1 tablet (75 mg total) by mouth daily with breakfast. 90 tablet 3  . Cyanocobalamin (B-12) 5000 MCG CAPS Take 1,500 mcg by mouth daily.     . diphenhydrAMINE (BENADRYL) 2 % cream Apply 1 application topically as needed for itching. For itchy palms     . fluticasone (FLONASE) 50 MCG/ACT nasal spray Place 2 sprays into both nostrils as needed for allergies.     Marland Kitchen losartan (COZAAR) 100 MG tablet Take 100 mg by mouth daily.    . nitroGLYCERIN (NITROSTAT) 0.4 MG SL tablet Place 1 tablet (0.4 mg total) under the tongue every 5 (five) minutes as needed for chest pain. 30 tablet 0  . potassium chloride SA (KLOR-CON) 20 MEQ tablet Take 56mq in the AM 272m at lunch and 4080min the PM    . rivaroxaban (XARELTO) 20 MG TABS tablet Take 1 tablet  (20 mg total) by mouth daily with supper. 90 tablet 3  . spironolactone (ALDACTONE) 25 MG tablet Take 25 mg by mouth daily.    . tMarland Kitchenrsemide (DEMADEX) 20 MG tablet Take 60 mg by mouth daily.    . VMarland KitchenCTOZA 18 MG/3ML SOPN Inject 1.8 mg into the skin at bedtime.     . nicotine (NICODERM CQ - DOSED IN MG/24 HOURS) 14 mg/24hr patch Place 1 patch (14 mg total) onto the skin daily. (Patient not taking: Reported on 09/20/2019) 14 patch 0  . nicotine (NICODERM CQ - DOSED IN MG/24 HOURS) 21 mg/24hr patch Place 1 patch (21 mg total) onto the skin daily. (Patient not taking: Reported on 09/20/2019) 42 patch 0  . nicotine (NICODERM CQ - DOSED IN MG/24 HR) 7 mg/24hr patch Place 1 patch (7 mg total) onto the skin daily. (Patient not taking: Reported on 09/20/2019) 14 patch 0  . Nicotine 21-14-7 MG/24HR KIT Apply 1 patch topically daily.  (Patient not taking: Reported on 09/20/2019)     No current facility-administered medications for this encounter.    Allergies  Allergen Reactions  . Penicillins Hives and Rash    Has patient had a PCN reaction causing immediate rash, facial/tongue/throat swelling, SOB or lightheadedness with hypotension: Yes Has patient had a PCN reaction causing severe rash involving mucus membranes or skin necrosis: No Has patient had a PCN reaction that required hospitalization No Has patient had a PCN reaction occurring within the last 10 years: No If all of the above answers are "NO", then may proceed with Cephalosporin use.     Social History   Socioeconomic History  . Marital status: Widowed    Spouse name: Not on file  . Number of children: 2  . Years of education: Not on file  . Highest education level: Not on file  Occupational History  . Occupation: works at [employer]   Tobacco Use  . Smoking status: Current Every Day Smoker    Packs/day: 0.30    Years: 29.00    Pack years: 8.70    Types: Cigarettes    Start date: 04/09/1970  . Smokeless tobacco: Never Used   . Tobacco comment: 5 cigarettes a day 09/20/19  Vaping Use  . Vaping Use: Never used  Substance and Sexual Activity  . Alcohol use: Not Currently  . Drug use: No  . Sexual activity: Not Currently    Birth control/protection: Post-menopausal  Other Topics Concern  . Not on file  Social History Narrative  . Not on file   Social Determinants  of Health   Financial Resource Strain:   . Difficulty of Paying Living Expenses:   Food Insecurity:   . Worried About Charity fundraiser in the Last Year:   . Arboriculturist in the Last Year:   Transportation Needs:   . Film/video editor (Medical):   Marland Kitchen Lack of Transportation (Non-Medical):   Physical Activity:   . Days of Exercise per Week:   . Minutes of Exercise per Session:   Stress:   . Feeling of Stress :   Social Connections:   . Frequency of Communication with Friends and Family:   . Frequency of Social Gatherings with Friends and Family:   . Attends Religious Services:   . Active Member of Clubs or Organizations:   . Attends Archivist Meetings:   Marland Kitchen Marital Status:   Intimate Partner Violence:   . Fear of Current or Ex-Partner:   . Emotionally Abused:   Marland Kitchen Physically Abused:   . Sexually Abused:      ROS- All systems are reviewed and negative except as per the HPI above.  Physical Exam: Vitals:   09/20/19 1128  BP: 110/64  Pulse: 62  SpO2: 97%  Weight: 126.3 kg  Height: 5' 5"  (1.651 m)    GEN- The patient is well appearing obese female, alert and oriented x 3 today.   HEENT-head normocephalic, atraumatic, sclera clear, conjunctiva pink, hearing intact, trachea midline. Lungs- Clear to ausculation bilaterally, normal work of breathing Heart- irregular rate and rhythm, bradycardia, no murmurs, rubs or gallops  GI- soft, NT, ND, + BS Extremities- no clubbing, cyanosis, or edema MS- no significant deformity or atrophy Skin- no rash or lesion Psych- euthymic mood, full affect Neuro- strength and  sensation are intact   Wt Readings from Last 3 Encounters:  09/20/19 126.3 kg  09/15/19 125.2 kg  09/09/19 (!) 125.4 kg    EKG today demonstrates afib HR 48, QRS 78, QTc 368  Echo 04/28/19 demonstrated  1. Left ventricular ejection fraction, by estimation, is 55 to 60%. Left  ventricular ejection fraction by 3D volume is 58 %. The left ventricle has  normal function. The left ventricle has no regional wall motion  abnormalities. The left ventricular  internal cavity size was mildly dilated. There is mild left ventricular  hypertrophy. Left ventricular diastolic parameters are indeterminate.  2. Right ventricular systolic function is normal. The right ventricular  size is mildly enlarged. There is mildly elevated pulmonary artery  systolic pressure. The estimated right ventricular systolic pressure is  88.2 mmHg.  3. The mitral valve is normal in structure. Mild mitral valve  regurgitation.  4. The aortic valve is tricuspid. Aortic valve regurgitation is not  visualized. Mild aortic stenosis. Vmax 2.8 m/s, MG 12mHg, AVA 1.4 cm^2,  DI 0.55  5. Aortic dilatation noted. There is mild dilatation of the ascending  aorta measuring 36 mm.  6. The inferior vena cava is dilated in size with <50% respiratory  variability, suggesting right atrial pressure of 15 mmHg.   Epic records are reviewed at length today  CHA2DS2-VASc Score = 3  The patient's score is based upon: CHF History: 0 HTN History: 1 Age : 0 Diabetes History: 0 Stroke History: 0 Vascular Disease History: 1 Gender: 1      ASSESSMENT AND PLAN: 1. Persistent Atrial Fibrillation (ICD10:  I48.19) Patient wants to pursue dofetilide, aware of risk vrs benefit Aware of price of dofetilide. No benadryl use PharmD has screened drugs and  no contraindicated medications on board QTc in SR 422 ms, Labs today show creatinine at 0.78, K+ 5.0 and mag 2.1, CrCl calculated at 145 mL/min Continue Xarelto 20 mg daily.  Patient denies any missed doses in the last 3 weeks.   2. Secondary Hypercoagulable State (ICD10:  D68.69) The patient is at significant risk for stroke/thromboembolism based upon her CHA2DS2-VASc Score of 3.  Continue Rivaroxaban (Xarelto).   3. Obesity Body mass index is 46.33 kg/m. Lifestyle modification was discussed and encouraged including regular physical activity and weight reduction.  4. CAD S/p PCI to RCA 04/27/19. No anginal symptoms.  5. HTN Stable, no changes today.   To be admitted later today once a bed becomes available.    [facility] [address] [place], [place] [address] [phone]

## 2019-09-21 DIAGNOSIS — I4819 Other persistent atrial fibrillation: Principal | ICD-10-CM

## 2019-09-21 LAB — BASIC METABOLIC PANEL
Anion gap: 10 (ref 5–15)
BUN: 16 mg/dL (ref 8–23)
CO2: 27 mmol/L (ref 22–32)
Calcium: 9.1 mg/dL (ref 8.9–10.3)
Chloride: 97 mmol/L — ABNORMAL LOW (ref 98–111)
Creatinine, Ser: 0.89 mg/dL (ref 0.44–1.00)
GFR calc Af Amer: >60 mL/min (ref >60)
GFR calc non Af Amer: >60 mL/min (ref >60)
Glucose, Bld: 125 mg/dL — ABNORMAL HIGH (ref 70–99)
Potassium: 3.9 mmol/L (ref 3.5–5.1)
Sodium: 134 mmol/L — ABNORMAL LOW (ref 135–145)

## 2019-09-21 LAB — PROTIME-INR
INR: 1.3 — ABNORMAL HIGH (ref 0.8–1.2)
Prothrombin Time: 15.6 seconds — ABNORMAL HIGH (ref 11.4–15.2)

## 2019-09-21 LAB — MAGNESIUM: Magnesium: 2.1 mg/dL (ref 1.7–2.4)

## 2019-09-21 MED ORDER — POTASSIUM CHLORIDE CRYS ER 20 MEQ PO TBCR
40.0000 meq | EXTENDED_RELEASE_TABLET | Freq: Once | ORAL | Status: AC
  Administered 2019-09-21: 40 meq via ORAL
  Filled 2019-09-21: qty 2

## 2019-09-21 MED ORDER — DOFETILIDE 250 MCG PO CAPS
250.0000 ug | ORAL_CAPSULE | Freq: Two times a day (BID) | ORAL | Status: DC
  Administered 2019-09-21: 250 ug via ORAL
  Filled 2019-09-21: qty 1

## 2019-09-21 MED ORDER — DICLOFENAC SODIUM 1 % EX GEL
4.0000 g | Freq: Four times a day (QID) | CUTANEOUS | Status: DC
  Administered 2019-09-21 – 2019-09-23 (×4): 4 g via TOPICAL
  Filled 2019-09-21: qty 100

## 2019-09-21 NOTE — Progress Notes (Signed)
Pharmacy: Dofetilide (Tikosyn) - Follow Up Assessment and Electrolyte Replacement  Pharmacy consulted to assist in monitoring and replacing electrolytes in this 64 y.o. female admitted on 09/20/2019 undergoing dofetilide initiation.   Labs:    Component Value Date/Time   K 3.9 09/21/2019 0430   MG 2.1 09/21/2019 0430     Plan: Potassium: K 3.8-3.9:  Give KCl 40 mEq po x1   Magnesium: Mg > 2: No additional supplementation needed     Thank you for allowing pharmacy to participate in this patient's care   Hildred Laser, PharmD Clinical Pharmacist **Pharmacist phone directory can now be found on [email or web address] ([email or web address]).  Listed under [place].

## 2019-09-21 NOTE — Progress Notes (Signed)
Pt HR dropped to 38 and 35 nonsustained while lying on right side sleeping.  Pt awakened, only c/o LLE TED hose tight and requested removal.  Bilateral TEDS removed per pt request. BP 98/60, MAP 73.  Heart rate now 71.  Will cont to monitor pt closely.

## 2019-09-21 NOTE — Progress Notes (Addendum)
  Morning EKG reviewed  Shows pt remains in afib at 59 bpm with prolonged QTc at ~505 ms when measured manually. Multiple leads measured and unable to find a lead <500 ms.   At this time, will review with Dr. Rayann Heman, but likely plan to decrease Tikosyn to 250 mcg BID starting with this evenings dose.   Pt will be NPO after midnight for DCCV if remains in [place], [place]  Pager: [phone]  09/21/2019 2:39 PM

## 2019-09-21 NOTE — Progress Notes (Addendum)
Electrophysiology Rounding Note  Patient Name: Amy Tran Date of Encounter: 09/21/2019  Primary Cardiologist: Carlyle Dolly, MD  Electrophysiologist: None    Subjective   Pt remains in afib on Tikosyn 500 mcg BID   QTc from EKG last pm shows stable QTc at ~470ms. Some overnight bradycardia noted.  The patient is doing well today.  At this time, the patient denies chest pain, shortness of breath, or any new concerns.  Inpatient Medications    Scheduled Meds: . amLODipine  5 mg Oral Daily  . atorvastatin  80 mg Oral q1800  . clopidogrel  75 mg Oral Q breakfast  . dofetilide  500 mcg Oral BID  . loratadine  10 mg Oral Daily  . losartan  100 mg Oral Daily  . rivaroxaban  20 mg Oral Q supper  . sodium chloride flush  3 mL Intravenous Q12H  . spironolactone  25 mg Oral Daily  . torsemide  60 mg Oral Daily  . vitamin B-12  1,500 mcg Oral Q breakfast   Continuous Infusions: . sodium chloride     PRN Meds: sodium chloride, albuterol, nitroGLYCERIN, sodium chloride flush   Vital Signs    Vitals:   09/20/19 1418 09/20/19 1950 09/21/19 0202 09/21/19 0435  BP: 105/66 126/66 98/61 (!) 105/59  Pulse: (!) 57 (!) 54 (!) 53 74  Resp: 18 15 16 18   Temp: 98.1 F (36.7 C) 98.2 F (36.8 C) 98.2 F (36.8 C) 98.1 F (36.7 C)  TempSrc: Oral Oral Oral Oral  SpO2: 98% 97% 96% 98%  Weight: 126 kg   122.8 kg  Height: 5\' 5"  (1.651 m)       Intake/Output Summary (Last 24 hours) at 09/21/2019 0728 Last data filed at 09/20/2019 2200 Gross per 24 hour  Intake 243 ml  Output 1000 ml  Net -757 ml   Filed Weights   09/20/19 1418 09/21/19 0435  Weight: 126 kg 122.8 kg    Physical Exam    GEN- The patient is well appearing, alert and oriented x 3 today.   Head- normocephalic, atraumatic Eyes-  Sclera clear, conjunctiva pink Ears- hearing intact Oropharynx- clear Neck- supple Lungs- Clear to ausculation bilaterally, normal work of breathing Heart- Irregular and  somewhat brady rate and rhythm, no murmurs, rubs or gallops GI- soft, NT, ND, + BS Extremities- no clubbing, cyanosis, or edema Skin- no rash or lesion Psych- euthymic mood, full affect Neuro- strength and sensation are intact  Labs    CBC No results for input(s): WBC, NEUTROABS, HGB, HCT, MCV, PLT in the last 72 hours. Basic Metabolic Panel Recent Labs    09/20/19 1106 09/21/19 0430  NA 136 134*  K 5.0 3.9  CL 102 97*  CO2 26 27  GLUCOSE 145* 125*  BUN 13 16  CREATININE 0.78 0.89  CALCIUM 9.8 9.1  MG 2.1 2.1    Potassium  Date/Time Value Ref Range Status  09/21/2019 04:30 AM 3.9 3.5 - 5.1 mmol/L Final   Magnesium  Date/Time Value Ref Range Status  09/21/2019 04:30 AM 2.1 1.7 - 2.4 mg/dL Final    Comment:    Performed at [facility], [place] [address], [place], [place] [address]    Telemetry    AF with slow VR 50-60s mostly, occasionally into upper 30s while sleeping. PVCs which preceded medication  (personally reviewed)  Radiology    No results found.   Patient Profile     Amy Tran is a 64 y.o.  female with a past medical history significant for persistent atrial fibrillation.  They were admitted for tikosyn load.   Assessment & Plan    1. Persistent atrial fibrillation Pt remains in afib on Tikosyn 500 mcg BID  Continue Xarelto K 3.9 and Mg 2.1. CHA2DS2VASC is at least 4.  2. HTN Stable to soft currently. Follow.   If pt does not convert chemically, plan on DCCV tomorrow   For questions or updates, please contact [place] Please consult [email or web address] for contact info under Cardiology/STEMI.  Signed, Shirley Friar, PA-C  09/21/2019, 7:28 AM    I have seen, examined the patient, and reviewed the above assessment and plan.  Changes to above are made where necessary.  On exam, iRRR.  Remains in afib.  Continue tikosyn load.  Plan cardioversion tomorrow if still in AF.  Co Sign: Thompson Grayer, MD 09/21/2019 9:23  AM

## 2019-09-21 NOTE — Progress Notes (Signed)
Pt converted to NSR/SB

## 2019-09-21 NOTE — TOC Benefit Eligibility Note (Signed)
Transition of Care [employer]) Benefit Eligibility Note    Patient Details  Name: Amy Tran MRN: [id number] Date of Birth: 1955-11-03   Medication/Dose: Phyllis Ginger 125 MCG BID  250 MCG BID  500 MCG BID        Prescription Coverage Preferred Pharmacy: [facility] [place] with Person/Company/Phone Number:: [facility] [place] [facility] # [phone]  Co-Pay: $ 4.00        Additional Notes: EFF-DATE  08-12-2019    Memory [place] Phone Number: 09/21/2019, 11:31 AM

## 2019-09-21 NOTE — Care Management (Signed)
1007 09-21-19 Case Manager received consult for Tikosyn. Benefits check submitted for Tikosyn and Case Manager will follow for cost and pharmacy of choice. Bethena Roys, RN,BSN Case Manager

## 2019-09-22 ENCOUNTER — Encounter (HOSPITAL_COMMUNITY)
Admission: AD | Disposition: A | Discharge status: Home / Self Care | Payer: Self-pay | Source: Ambulatory Visit | Attending: Internal Medicine

## 2019-09-22 LAB — BASIC METABOLIC PANEL
Anion gap: 9 (ref 5–15)
BUN: 22 mg/dL (ref 8–23)
CO2: 29 mmol/L (ref 22–32)
Calcium: 9.2 mg/dL (ref 8.9–10.3)
Chloride: 97 mmol/L — ABNORMAL LOW (ref 98–111)
Creatinine, Ser: 0.99 mg/dL (ref 0.44–1.00)
GFR calc Af Amer: >60 mL/min (ref >60)
GFR calc non Af Amer: >60 mL/min (ref >60)
Glucose, Bld: 142 mg/dL — ABNORMAL HIGH (ref 70–99)
Potassium: 4.1 mmol/L (ref 3.5–5.1)
Sodium: 135 mmol/L (ref 135–145)

## 2019-09-22 LAB — MAGNESIUM: Magnesium: 2.1 mg/dL (ref 1.7–2.4)

## 2019-09-22 SURGERY — CARDIOVERSION
Anesthesia: General

## 2019-09-22 MED ORDER — DOFETILIDE 125 MCG PO CAPS
125.0000 ug | ORAL_CAPSULE | Freq: Two times a day (BID) | ORAL | Status: DC
  Filled 2019-09-22: qty 1

## 2019-09-22 MED ORDER — DOFETILIDE 250 MCG PO CAPS
250.0000 ug | ORAL_CAPSULE | Freq: Two times a day (BID) | ORAL | Status: DC
  Administered 2019-09-22 – 2019-09-23 (×3): 250 ug via ORAL
  Filled 2019-09-22 (×3): qty 1

## 2019-09-22 NOTE — Progress Notes (Signed)
Morning EKG reviewed  Shows remains in NSR at 50 bpm with improved QTc at ~440-450 ms now.  Continue Tikosyn 250 mcg BID.   Plan for home tomorrow if QTc remains stable.    Shirley Friar, PA-C  Pager: [phone]  09/22/2019 12:11 PM

## 2019-09-22 NOTE — Progress Notes (Signed)
Pharmacy: Dofetilide (Tikosyn) - Follow Up Assessment and Electrolyte Replacement  Pharmacy consulted to assist in monitoring and replacing electrolytes in this 64 y.o. female admitted on 09/20/2019 undergoing dofetilide initiation.   Labs:    Component Value Date/Time   K 4.1 09/22/2019 0551   MG 2.1 09/22/2019 0551     Plan: Potassium: K >/= 4: No additional supplementation needed  Magnesium: Mg > 2: No additional supplementation needed   Thank you for allowing pharmacy to participate in this patient's care   Hildred Laser, PharmD Clinical Pharmacist **Pharmacist phone directory can now be found on [email or web address] ([email or web address]).  Listed under [place].

## 2019-09-22 NOTE — Care Management (Signed)
1556 09-22-19 Case Manager did call [facility] and they do not have Dofetilide in stock. Patient is aware of co pay and we discussed getting the Tikosyn filled here at [place]. Patient is agreeable to that plan and Rx can be [facility] to [facility] for [facility] day supply and then the [facility] so [facility] can order. [place], Ocie Cornfield, RN, BSN Case Manager

## 2019-09-22 NOTE — Progress Notes (Addendum)
Electrophysiology Rounding Note  Patient Name: Amy Tran Date of Encounter: 09/22/2019  Primary Cardiologist: Carlyle Dolly, MD  Electrophysiologist: None    Subjective   Pt converted to sinus rhythm on Tikosyn 250 mcg BID   QTc from EKG last pm shows prolonged QTc at ~500-520 ms depending on lead. (Lowest is 498 by Fridericia at low rates which have since increased)  The patient is doing well today.  At this time, the patient denies chest pain, shortness of breath, or any new concerns.  Inpatient Medications    Scheduled Meds:  amLODipine  5 mg Oral Daily   atorvastatin  80 mg Oral q1800   clopidogrel  75 mg Oral Q breakfast   diclofenac Sodium  4 g Topical QID   dofetilide  250 mcg Oral BID   loratadine  10 mg Oral Daily   losartan  100 mg Oral Daily   rivaroxaban  20 mg Oral Q supper   sodium chloride flush  3 mL Intravenous Q12H   spironolactone  25 mg Oral Daily   torsemide  60 mg Oral Daily   vitamin B-12  1,500 mcg Oral Q breakfast   Continuous Infusions:  sodium chloride     PRN Meds: sodium chloride, albuterol, nitroGLYCERIN, sodium chloride flush   Vital Signs    Vitals:   09/21/19 0435 09/21/19 1531 09/21/19 2021 09/22/19 0512  BP: (!) 105/59 (!) 111/55 (!) 114/42 (!) 111/40  Pulse: 74 (!) 42 (!) 41 (!) 48  Resp: 18 20 20 18   Temp: 98.1 F (36.7 C) 99.3 F (37.4 C) 97.8 F (36.6 C) 98.1 F (36.7 C)  TempSrc: Oral Oral Oral Oral  SpO2: 98% 98% 100% 98%  Weight: 122.8 kg   123.9 kg  Height:        Intake/Output Summary (Last 24 hours) at 09/22/2019 0740 Last data filed at 09/21/2019 2300 Gross per 24 hour  Intake 600 ml  Output 1000 ml  Net -400 ml   Filed Weights   09/20/19 1418 09/21/19 0435 09/22/19 0512  Weight: 126 kg 122.8 kg 123.9 kg    Physical Exam    GEN- The patient is well appearing, alert and oriented x 3 today.   Head- normocephalic, atraumatic Eyes-  Sclera clear, conjunctiva pink Ears- hearing  intact Oropharynx- clear Neck- supple Lungs- Clear to ausculation bilaterally, normal work of breathing Heart- Regular rate and rhythm, no murmurs, rubs or gallops GI- soft, NT, ND, + BS Extremities- no clubbing, cyanosis, or edema Skin- no rash or lesion Psych- euthymic mood, full affect Neuro- strength and sensation are intact  Labs    CBC No results for input(s): WBC, NEUTROABS, HGB, HCT, MCV, PLT in the last 72 hours. Basic Metabolic Panel Recent Labs    09/21/19 0430 09/22/19 0551  NA 134* 135  K 3.9 4.1  CL 97* 97*  CO2 27 29  GLUCOSE 125* 142*  BUN 16 22  CREATININE 0.89 0.99  CALCIUM 9.1 9.2  MG 2.1 2.1    Potassium  Date/Time Value Ref Range Status  09/22/2019 05:51 AM 4.1 3.5 - 5.1 mmol/L Final   Magnesium  Date/Time Value Ref Range Status  09/22/2019 05:51 AM 2.1 1.7 - 2.4 mg/dL Final    Comment:    Performed at [facility], [place] [address] [place], [place] [address]    Telemetry   AF ->  SB/NSR 50-60s yesterday evening (personally reviewed)  Radiology    No results found.   Patient  Profile     Amy Tran is a 64 y.o. female with a past medical history significant for persistent atrial fibrillation.  They were admitted for tikosyn load.   Assessment & Plan    1. Persistent atrial fibrillation Pt converted to sinus rhythm on Tikosyn 250 mcg BID  Continue Xarelto Electrolytes stable.  CHA2DS2VASC is at least 4.  2. Prolonged QTc Unfortunately, her QTc remains borderline/prolonged on 250. Will get clarifying EKG this am with increased HRs (were initially in 30-40s after she initially converted). Will discuss further with Dr. Rayann Heman. Suspect patient will end up on lowest dose tikosyn.   3. PVCs These were noted prior to tikosyn start, but slightly increased in frequency this am.   Pt will not require DCCV.   ADDENDUM - Reviewed with Dr. Rayann Heman. QTc has been borderline but improved. Will give dose of 250 mcg this am and  continue to follow closely for dose adjustment.   For questions or updates, please contact [place] Please consult [email or web address] for contact info under Cardiology/STEMI.  Signed, Shirley Friar, PA-C  09/22/2019, 7:40 AM    I have seen spoken with the patientand reviewed the above assessment and plan.  Changes to above are made where necessary.  On exam, well sounding, NAD.  Qt was long yesterday. Now better with tikosyn 250 mcg BID.  She remains in sinus.  Hopefully to discharge tomorrow if QT remains stable.  Co Sign: Thompson Grayer, MD 09/22/2019 11:44 AM

## 2019-09-23 LAB — BASIC METABOLIC PANEL
Anion gap: 11 (ref 5–15)
BUN: 23 mg/dL (ref 8–23)
CO2: 25 mmol/L (ref 22–32)
Calcium: 9.1 mg/dL (ref 8.9–10.3)
Chloride: 98 mmol/L (ref 98–111)
Creatinine, Ser: 0.85 mg/dL (ref 0.44–1.00)
GFR calc Af Amer: >60 mL/min (ref >60)
GFR calc non Af Amer: >60 mL/min (ref >60)
Glucose, Bld: 137 mg/dL — ABNORMAL HIGH (ref 70–99)
Potassium: 4 mmol/L (ref 3.5–5.1)
Sodium: 134 mmol/L — ABNORMAL LOW (ref 135–145)

## 2019-09-23 LAB — MAGNESIUM: Magnesium: 2.1 mg/dL (ref 1.7–2.4)

## 2019-09-23 MED ORDER — POTASSIUM CHLORIDE CRYS ER 20 MEQ PO TBCR: 20.0000 meq | EXTENDED_RELEASE_TABLET | Freq: Two times a day (BID) | ORAL | Status: AC

## 2019-09-23 MED ORDER — DOFETILIDE 250 MCG PO CAPS: 250.0000 ug | ORAL_CAPSULE | Freq: Two times a day (BID) | ORAL | 6 refills | Status: DC

## 2019-09-23 MED FILL — DOFETILIDE 250 MCG CAPS: 250 | 30 days supply | Qty: 60 | Fill #0

## 2019-09-23 NOTE — Progress Notes (Signed)
Discharge instructions including but not limited to f/u appmts, meds, diet & etc. All belongings gathered & given to pt. Medications given to pt as well. Discharge instructions verified via teachback method. Hoover Brunette, RN

## 2019-09-23 NOTE — Progress Notes (Addendum)
Evening EKG reviewed  Shows remains in NSR at 57 bpm with stable QTc at ~460 ms.  Continue Tikosyn 250 mcg BID.   Electrolytes stable.  Plan for discharge this afternoon if QTc remains stable s/p morning dose.    Shirley Friar, PA-C  Pager: [phone]  09/23/2019 7:21 AM     I have seen, examined the patient, and reviewed the above assessment and plan.  Changes to above are made where necessary.  On exam, RRR.  Doing well with tikosyn.  Qt is stable.  Occasional PVCs are noted and were present prior to initiation of tikosyn. Anticipate discharge this afternoon if qt is still ok with close follow-up next week in the AF clinic.  Co Sign: Thompson Grayer, MD 09/23/2019 9:14 AM

## 2019-09-23 NOTE — Plan of Care (Signed)
Problem: Education: Goal: Knowledge of General Education information will improve Description: Including pain rating scale, medication(s)/side effects and non-pharmacologic comfort measures Outcome: Completed/Met   Problem: Health Behavior/Discharge Planning: Goal: Ability to manage health-related needs will improve Outcome: Completed/Met   Problem: Education: Goal: Knowledge of disease or condition will improve Outcome: Completed/Met Goal: Understanding of medication regimen will improve Outcome: Completed/Met Goal: Individualized Educational Video(s) Outcome: Completed/Met   Problem: Activity: Goal: Ability to tolerate increased activity will improve Outcome: Completed/Met   Problem: Cardiac: Goal: Ability to achieve and maintain adequate cardiopulmonary perfusion will improve Outcome: Completed/Met   Problem: Health Behavior/Discharge Planning: Goal: Ability to safely manage health-related needs after discharge will improve Outcome: Completed/Met   

## 2019-09-23 NOTE — Discharge Summary (Signed)
ELECTROPHYSIOLOGY PROCEDURE DISCHARGE SUMMARY    Patient ID: Amy Tran,  MRN: [id number], DOB/AGE: Apr 25, 1955 64 y.o.  Admit date: 09/20/2019 Discharge date: 09/23/2019  Primary Care Physician: Asencion Noble, MD  Primary Cardiologist: Carlyle Dolly, MD  Electrophysiologist: Dr. Rayann Heman  Primary Discharge Diagnosis:  1.  Persistent atrial fibrillation status post Tikosyn loading this admission  Secondary Discharge Diagnosis:  2. PVCs 3. Mild QT prolongation  Allergies  Allergen Reactions  . Penicillins Hives and Rash    Has patient had a PCN reaction causing immediate rash, facial/tongue/throat swelling, SOB or lightheadedness with hypotension: Yes Has patient had a PCN reaction causing severe rash involving mucus membranes or skin necrosis: No Has patient had a PCN reaction that required hospitalization No Has patient had a PCN reaction occurring within the last 10 years: No If all of the above answers are "NO", then may proceed with Cephalosporin use.      Procedures This Admission:  1.  Tikosyn loading  Brief HPI: Amy Tran is a 64 y.o. female with a past medical history as noted above.  They were referred to EP in the outpatient setting for treatment options of atrial fibrillation.  Risks, benefits, and alternatives to Tikosyn were reviewed with the patient who wished to proceed.    Hospital Course:  The patient was admitted and Tikosyn was initiated.  Renal function and electrolytes were followed during the hospitalization.  Their QTc prolonged on 500 mcg dose of tikosyn, but stabilized on 250 mcg dose. She converted to NSR on tikosyn and did not require cardioversion.  They were monitored until discharge on telemetry which demonstrated NSR/sinus brady.  On the day of discharge, they were examined by Dr. Rayann Heman  who considered them stable for discharge to home.  Follow-up has been arranged with the Atrial Fibrillation clinic in approximately 1 week and  with Dr. Rayann Heman  in 4 weeks.   Her potassium requirement here varied significantly from her previous home dose. She was previously on 40/20/40 for a total of 100 meq at home.  Pt only required 1 dose of 40 meq this admission. Discussed with Pharm-D and will send home on Potassium 20 meq BID. Pt aware. And will recheck BMET and visit next week.   Physical Exam: Vitals:   09/22/19 1515 09/22/19 2049 09/23/19 0414 09/23/19 0900  BP: (!) 102/45 (!) 159/73 (!) 144/63 (!) 153/58  Pulse: (!) 50 (!) 46 (!) 45   Resp:  15 16   Temp: 98.7 F (37.1 C) 98.1 F (36.7 C) 98.4 F (36.9 C)   TempSrc: Oral Oral Oral   SpO2: 100% 99% 99%   Weight:   124.3 kg   Height:        GEN- The patient is well appearing, alert and oriented x 3 today.   HEENT: normocephalic, atraumatic; sclera clear, conjunctiva pink; hearing intact; oropharynx clear; neck supple, no JVP Lymph- no cervical lymphadenopathy Lungs- Clear to ausculation bilaterally, normal work of breathing.  No wheezes, rales, rhonchi Heart- Regular and slow rate and rhythm, no murmurs, rubs or gallops, PMI not laterally displaced GI- soft, non-tender, non-distended, bowel sounds present, no hepatosplenomegaly Extremities- no clubbing, cyanosis, or edema; DP/PT/radial pulses 2+ bilaterally MS- no significant deformity or atrophy Skin- warm and dry, no rash or lesion Psych- euthymic mood, full affect Neuro- strength and sensation are intact   Labs:   Lab Results  Component Value Date   WBC 9.6 07/06/2019   HGB 12.5 07/06/2019  HCT 39.3 07/06/2019   MCV 85.2 07/06/2019   PLT 347 07/06/2019    Recent Labs  Lab 09/23/19 0353  NA 134*  K 4.0  CL 98  CO2 25  BUN 23  CREATININE 0.85  CALCIUM 9.1  GLUCOSE 137*     Discharge Medications:  Allergies as of 09/23/2019      Reactions   Penicillins Hives, Rash   Has patient had a PCN reaction causing immediate rash, facial/tongue/throat swelling, SOB or lightheadedness with  hypotension: Yes Has patient had a PCN reaction causing severe rash involving mucus membranes or skin necrosis: No Has patient had a PCN reaction that required hospitalization No Has patient had a PCN reaction occurring within the last 10 years: No If all of the above answers are "NO", then may proceed with Cephalosporin use.      Medication List    TAKE these medications   albuterol 108 (90 Base) MCG/ACT inhaler Commonly known as: VENTOLIN HFA Inhale 2 puffs into the lungs every 6 (six) hours as needed for wheezing or shortness of breath.   amLODipine 5 MG tablet Commonly known as: NORVASC Take 5 mg by mouth daily with breakfast.   atorvastatin 80 MG tablet Commonly known as: LIPITOR Take 1 tablet (80 mg total) by mouth daily at 6 PM. What changed: when to take this   BD Pen Needle Nano U/F 32G X 4 MM Misc Generic drug: Insulin Pen Needle USE FOR VICTOZA INJECTIONS   cetirizine 10 MG tablet Commonly known as: ZYRTEC Take 10 mg by mouth at bedtime.   clopidogrel 75 MG tablet Commonly known as: PLAVIX Take 1 tablet (75 mg total) by mouth daily with breakfast.   Cyanocobalamin 1500 MCG Tbdp Take 1,500 mcg by mouth daily with breakfast. Vitamin B12   diphenhydrAMINE 2 % cream Commonly known as: BENADRYL Apply 1 application topically daily as needed (itchy hands).   dofetilide 250 MCG capsule Commonly known as: TIKOSYN Take 1 capsule (250 mcg total) by mouth 2 (two) times daily.   fluticasone 50 MCG/ACT nasal spray Commonly known as: FLONASE Place 2 sprays into both nostrils daily as needed for rhinitis (congestion/ dryness).   losartan 100 MG tablet Commonly known as: COZAAR Take 100 mg by mouth daily with breakfast.   nicotine 21 mg/24hr patch Commonly known as: NICODERM CQ - dosed in mg/24 hours Place 1 patch (21 mg total) onto the skin daily.   nicotine 14 mg/24hr patch Commonly known as: NICODERM CQ - dosed in mg/24 hours Place 1 patch (14 mg total) onto  the skin daily.   nicotine 7 mg/24hr patch Commonly known as: NICODERM CQ - dosed in mg/24 hr Place 1 patch (7 mg total) onto the skin daily.   Nicotine 21-14-7 MG/24HR Kit Apply 1 patch topically daily.   nitroGLYCERIN 0.4 MG SL tablet Commonly known as: NITROSTAT Place 1 tablet (0.4 mg total) under the tongue every 5 (five) minutes as needed for chest pain.   potassium chloride SA 20 MEQ tablet Commonly known as: KLOR-CON Take 1 tablet (20 mEq total) by mouth 2 (two) times daily. What changed:   how much to take  how to take this  when to take this  additional instructions   rivaroxaban 20 MG Tabs tablet Commonly known as: XARELTO Take 1 tablet (20 mg total) by mouth daily with supper. What changed: when to take this   spironolactone 25 MG tablet Commonly known as: ALDACTONE Take 25 mg by mouth daily with breakfast.  torsemide 20 MG tablet Commonly known as: DEMADEX Take 60 mg by mouth daily after breakfast.   Victoza 18 MG/3ML Sopn Generic drug: liraglutide Inject 1.8 mg into the skin at bedtime.       Disposition:    Follow-up Information    [facility] Follow up on 10/01/2019.   Specialty: Cardiology Why: at 1030 for post hospital tikosyn follow up. Parking code [phone]. Contact information: [address] [id number] [place] [address] [place] [phone]       Baldwin Jamaica, PA-C Follow up on 11/03/2019.   Specialty: Cardiology Why: at 1100 am for tikosyn follow up with Dr. Bonita Quin PA.  Contact information: [address] [place] [address] [phone]               Duration of Discharge Encounter: Greater than 30 minutes including physician time.  Jacalyn Lefevre, PA-C  09/23/2019 12:06 PM

## 2019-10-01 ENCOUNTER — Other Ambulatory Visit: Payer: Self-pay

## 2019-10-01 ENCOUNTER — Ambulatory Visit (HOSPITAL_COMMUNITY)
Admit: 2019-10-01 | Discharge: 2019-10-01 | Disposition: A | Discharge status: Home / Self Care | Payer: Medicaid Other | Source: Ambulatory Visit | Attending: Physician Assistant | Admitting: Physician Assistant

## 2019-10-01 VITALS — BP 106/52 | HR 53 | Ht 65.0 in | Wt 289.4 lb

## 2019-10-01 DIAGNOSIS — I10 Essential (primary) hypertension: Secondary | ICD-10-CM | POA: Diagnosis not present

## 2019-10-01 DIAGNOSIS — Z79899 Other long term (current) drug therapy: Secondary | ICD-10-CM | POA: Diagnosis not present

## 2019-10-01 DIAGNOSIS — Z6841 Body Mass Index (BMI) 40.0 and over, adult: Secondary | ICD-10-CM | POA: Insufficient documentation

## 2019-10-01 DIAGNOSIS — D6869 Other thrombophilia: Secondary | ICD-10-CM | POA: Diagnosis not present

## 2019-10-01 DIAGNOSIS — I480 Paroxysmal atrial fibrillation: Secondary | ICD-10-CM | POA: Insufficient documentation

## 2019-10-01 DIAGNOSIS — I252 Old myocardial infarction: Secondary | ICD-10-CM | POA: Diagnosis not present

## 2019-10-01 DIAGNOSIS — Z8249 Family history of ischemic heart disease and other diseases of the circulatory system: Secondary | ICD-10-CM | POA: Insufficient documentation

## 2019-10-01 DIAGNOSIS — F17210 Nicotine dependence, cigarettes, uncomplicated: Secondary | ICD-10-CM | POA: Diagnosis not present

## 2019-10-01 DIAGNOSIS — E785 Hyperlipidemia, unspecified: Secondary | ICD-10-CM | POA: Diagnosis not present

## 2019-10-01 DIAGNOSIS — I82422 Acute embolism and thrombosis of left iliac vein: Secondary | ICD-10-CM | POA: Insufficient documentation

## 2019-10-01 DIAGNOSIS — Z7902 Long term (current) use of antithrombotics/antiplatelets: Secondary | ICD-10-CM | POA: Insufficient documentation

## 2019-10-01 DIAGNOSIS — Z7901 Long term (current) use of anticoagulants: Secondary | ICD-10-CM | POA: Diagnosis not present

## 2019-10-01 DIAGNOSIS — E669 Obesity, unspecified: Secondary | ICD-10-CM | POA: Diagnosis not present

## 2019-10-01 DIAGNOSIS — E119 Type 2 diabetes mellitus without complications: Secondary | ICD-10-CM | POA: Diagnosis not present

## 2019-10-01 DIAGNOSIS — I4819 Other persistent atrial fibrillation: Secondary | ICD-10-CM

## 2019-10-01 DIAGNOSIS — I2510 Atherosclerotic heart disease of native coronary artery without angina pectoris: Secondary | ICD-10-CM | POA: Insufficient documentation

## 2019-10-01 LAB — BASIC METABOLIC PANEL
Anion gap: 9 (ref 5–15)
BUN: 13 mg/dL (ref 8–23)
CO2: 24 mmol/L (ref 22–32)
Calcium: 9.3 mg/dL (ref 8.9–10.3)
Chloride: 103 mmol/L (ref 98–111)
Creatinine, Ser: 0.74 mg/dL (ref 0.44–1.00)
GFR calc Af Amer: >60 mL/min (ref >60)
GFR calc non Af Amer: >60 mL/min (ref >60)
Glucose, Bld: 138 mg/dL — ABNORMAL HIGH (ref 70–99)
Potassium: 4.1 mmol/L (ref 3.5–5.1)
Sodium: 136 mmol/L (ref 135–145)

## 2019-10-01 LAB — MAGNESIUM: Magnesium: 2 mg/dL (ref 1.7–2.4)

## 2019-10-01 MED ORDER — DOFETILIDE 250 MCG PO CAPS: 250.0000 ug | ORAL_CAPSULE | Freq: Two times a day (BID) | ORAL | 6 refills | Status: DC

## 2019-10-01 NOTE — Progress Notes (Signed)
Primary Care Physician: Asencion Noble, MD Primary Cardiologist: Dr Harl Bowie Primary Electrophysiologist: Dr Rayann Heman Referring Physician: Dr Serita Kyle is a 64 y.o. female with a history of CAD, HTN, HLD, left iliac thrombosis on Xarelto, type 2 diabetes, tobacco abuse, persistent atrial fibrillation (CHA2DS2-VASc score 3), NSTEMI 04/25/2019 (DES to proximal RCA) who presents for follow up in the [facility]. The patient was initially diagnosed with atrial fibrillation in the setting of her NSTEMI. Patient is on Xarelto for a CHADS2VASC score of 3. Patient underwent DCCV on 06/03/19 but unfortunately was back in afib by her follow up on 06/22/19. Patient continues to have symptoms of fatigue despite good rate control. She denies any snoring or significant alcohol use. There were no specific triggers that the patient could identify. She denies bleeding issues on anticoagulation. She wore a Zio patch which did not show any significant bradycardia, block, or pauses. She did have a 67% afib burden however. Multaq was discontinued.  On follow up today, patient is s/p dofetilide loading 8/9-8/12/21. She converted with the medication and did not require DCCV. Patient reports she has done well since leaving the hospital. Her breathing has improved and she has less fatigue. She denies bleeding issues on anticoagulation.   Today, she denies symptoms of palpitations, chest pain, shortness of breath, orthopnea, PND, lower extremity edema, dizziness, presyncope, syncope, snoring, daytime somnolence, bleeding, or neurologic sequela. The patient is tolerating medications without difficulties and is otherwise without complaint today.    Atrial Fibrillation Risk Factors:  she does not have symptoms or diagnosis of sleep apnea. she does not have a history of rheumatic fever. she does not have a history of alcohol use. The patient does not have a history of early familial atrial  fibrillation or other arrhythmias.  she has a BMI of Body mass index is 48.16 kg/m.Marland Kitchen Filed Weights   10/01/19 1039  Weight: 131.3 kg    Family History  Problem Relation Age of Onset  . Diabetes Father   . Hypertension Father   . Parkinson's disease Father   . Cancer Father        not sure what kind  . Hypertension Mother   . Heart disease Mother   . Drug abuse Sister   . Mesothelioma Brother   . Cancer Daughter        non-hodgkins lmphoma  . Hypertension Brother   . Diabetes Brother   . Colon cancer Neg Hx   . Gastric cancer Neg Hx   . Esophageal cancer Neg Hx      Atrial Fibrillation Management history:  Previous antiarrhythmic drugs: Multaq, dofetilide  Previous cardioversions: 06/03/19, 07/13/19 Previous ablations: none CHADS2VASC score: 3 Anticoagulation history: Xarelto   Past Medical History:  Diagnosis Date  . Arthritis   . Asthma   . Cancer [facility])    multiple skin cancers  . Diabetes mellitus without complication ([place])   . Diverticulosis   . Fluid retention   . GERD (gastroesophageal reflux disease)   . High cholesterol   . Hypertension   . Incomplete rotator cuff tear   . Myocardial infarction ([place])   . Obesity   . Persistent atrial fibrillation ([place])   . PONV (postoperative nausea and vomiting)   . Wears glasses   . Wears partial dentures    bottom   Past Surgical History:  Procedure Laterality Date  . APPENDECTOMY    . CARDIOVERSION N/A 06/03/2019   Procedure: CARDIOVERSION;  Surgeon:  Herminio Commons, MD;  Location: [facility];  Service: Cardiovascular;  Laterality: N/A;  . CARDIOVERSION N/A 07/13/2019   Procedure: CARDIOVERSION;  Surgeon: Geralynn Rile, MD;  Location: [place];  Service: Cardiovascular;  Laterality: N/A;  . CARPAL TUNNEL RELEASE Bilateral 2001   bilateral  . CERVICAL POLYPECTOMY  04/08/2017   Procedure: POLYPECTOMY;  Surgeon: [name], MD;  Location: [facility];  Service: Gynecology;;  Endometrial  .  CHOLECYSTECTOMY    . COLONOSCOPY  2007   HFW:YOVZCHY internal hemorrhoids. Diminutive rectal polyp at 10 cm, cold biopsied/removed. The remainder of the rectal mucosa appeared normal Swallow left-sided diverticula. Diminutive polyp at the splenic flexure cold biopsied/removed (adenomatous)  . COLONOSCOPY N/A 12/28/2013   Procedure: COLONOSCOPY;  Surgeon: Daneil Dolin, MD;  [facility];  Service: Endoscopy;  Laterality: N/A;  730 - moved to 8:30 - Ginger notified pt  . CORONARY STENT INTERVENTION N/A 04/27/2019   Procedure: CORONARY STENT INTERVENTION;  Surgeon: Troy Sine, MD;  Location: [place] [facility];  Service: Cardiovascular;  Laterality: N/A;  . DILATION AND CURETTAGE, DIAGNOSTIC / THERAPEUTIC  03/07/2017  . ERCP  2002  . GASTRIC BYPASS  2004  . HYSTEROSCOPY WITH D & C N/A 04/08/2017   Procedure: DILATATION AND CURETTAGE /HYSTEROSCOPY;  Surgeon: [name], MD;  Location: [facility];  Service: Gynecology;  Laterality: N/A;  . INTRAVASCULAR ULTRASOUND/IVUS N/A 09/12/2017   Procedure: INTRAVASCULAR ULTRASOUND/IVUS;  Surgeon: Elam Dutch, MD;  Location: [place] [facility];  Service: Cardiovascular;  Laterality: N/A;  . LEFT HEART CATH AND CORONARY ANGIOGRAPHY N/A 04/27/2019   Procedure: LEFT HEART CATH AND CORONARY ANGIOGRAPHY;  Surgeon: Troy Sine, MD;  Location: [place] [facility];  Service: Cardiovascular;  Laterality: N/A;  . NECK SURGERY  1998   fusion  . PERCUTANEOUS VENOUS THROMBECTOMY,LYSIS WITH INTRAVASCULAR ULTRASOUND (IVUS) Left 09/13/2017   Procedure: REMOVAL OF LYSIS CATHETER AND INTRAVASCULAR ULTRASOUND (IVUS) LEFT ILIAC VEIN;  Surgeon: Elam Dutch, MD;  Location: [place];  Service: Vascular;  Laterality: Left;  . PERIPHERAL VASCULAR INTERVENTION  09/15/2017   Procedure: PERIPHERAL VASCULAR INTERVENTION;  Surgeon: Waynetta Sandy, MD;  Location: [place] [facility];  Service: Cardiovascular;;  VEINOUS/STENT  . PERIPHERAL VASCULAR  THROMBECTOMY Left 09/12/2017   Procedure: PERIPHERAL VASCULAR THROMBECTOMY;  Surgeon: Elam Dutch, MD;  Location: [place] [facility];  Service: Cardiovascular;  Laterality: Left;  . PERIPHERAL VASCULAR THROMBECTOMY Left 09/15/2017   Procedure: PERIPHERAL VASCULAR THROMBECTOMY;  Surgeon: Waynetta Sandy, MD;  Location: [place] [facility];  Service: Cardiovascular;  Laterality: Left;  IVC TO LT FEM/POP VEIN  . SHOULDER ARTHROSCOPY WITH SUBACROMIAL DECOMPRESSION Left 06/23/2013   Procedure: LEFT SHOULDER ARTHROSCOPY WITH DEBRIDEMENT ROTATOR CUFF AND LABRUM;  Surgeon: Lorn Junes, MD;  Location: [place];  Service: Orthopedics;  Laterality: Left;  . SHOULDER SURGERY  1999   left    Current Outpatient Medications  Medication Sig Dispense Refill  . albuterol (VENTOLIN HFA) 108 (90 Base) MCG/ACT inhaler Inhale 2 puffs into the lungs every 6 (six) hours as needed for wheezing or shortness of breath.    Marland Kitchen amLODipine (NORVASC) 5 MG tablet Take 5 mg by mouth daily with breakfast.     . atorvastatin (LIPITOR) 80 MG tablet Take 1 tablet (80 mg total) by mouth daily at 6 PM. (Patient taking differently: Take 80 mg by mouth at bedtime. ) 90 tablet 1  . BD PEN NEEDLE NANO U/F  32G X 4 MM MISC USE FOR VICTOZA INJECTIONS    . cetirizine (ZYRTEC) 10 MG tablet Take 10 mg by mouth at bedtime.     . clopidogrel (PLAVIX) 75 MG tablet Take 1 tablet (75 mg total) by mouth daily with breakfast. 90 tablet 3  . Cyanocobalamin 1500 MCG TBDP Take 1,500 mcg by mouth daily with breakfast. Vitamin B12    . diphenhydrAMINE (BENADRYL) 2 % cream Apply 1 application topically daily as needed (itchy hands).     . dofetilide (TIKOSYN) 250 MCG capsule Take 1 capsule (250 mcg total) by mouth 2 (two) times daily. 60 capsule 6  . fluticasone (FLONASE) 50 MCG/ACT nasal spray Place 2 sprays into both nostrils daily as needed for rhinitis (congestion/ dryness).     Marland Kitchen losartan (COZAAR) 100 MG tablet Take 100  mg by mouth daily with breakfast.     . nitroGLYCERIN (NITROSTAT) 0.4 MG SL tablet Place 1 tablet (0.4 mg total) under the tongue every 5 (five) minutes as needed for chest pain. 30 tablet 0  . potassium chloride SA (KLOR-CON) 20 MEQ tablet Take 1 tablet (20 mEq total) by mouth 2 (two) times daily.    . rivaroxaban (XARELTO) 20 MG TABS tablet Take 1 tablet (20 mg total) by mouth daily with supper. (Patient taking differently: Take 20 mg by mouth at bedtime. ) 90 tablet 3  . spironolactone (ALDACTONE) 25 MG tablet Take 25 mg by mouth daily with breakfast.     . torsemide (DEMADEX) 20 MG tablet Take 60 mg by mouth daily after breakfast.     . VICTOZA 18 MG/3ML SOPN Inject 1.8 mg into the skin at bedtime.     . nicotine (NICODERM CQ - DOSED IN MG/24 HOURS) 14 mg/24hr patch Place 1 patch (14 mg total) onto the skin daily. (Patient not taking: Reported on 10/01/2019) 14 patch 0  . nicotine (NICODERM CQ - DOSED IN MG/24 HOURS) 21 mg/24hr patch Place 1 patch (21 mg total) onto the skin daily. (Patient not taking: Reported on 10/01/2019) 42 patch 0  . nicotine (NICODERM CQ - DOSED IN MG/24 HR) 7 mg/24hr patch Place 1 patch (7 mg total) onto the skin daily. (Patient not taking: Reported on 09/20/2019) 14 patch 0  . Nicotine 21-14-7 MG/24HR KIT Apply 1 patch topically daily.  (Patient not taking: Reported on 09/20/2019)     No current facility-administered medications for this encounter.    Allergies  Allergen Reactions  . Penicillins Hives and Rash    Has patient had a PCN reaction causing immediate rash, facial/tongue/throat swelling, SOB or lightheadedness with hypotension: Yes Has patient had a PCN reaction causing severe rash involving mucus membranes or skin necrosis: No Has patient had a PCN reaction that required hospitalization No Has patient had a PCN reaction occurring within the last 10 years: No If all of the above answers are "NO", then may proceed with Cephalosporin use.     Social History    Socioeconomic History  . Marital status: Widowed    Spouse name: Not on file  . Number of children: 2  . Years of education: Not on file  . Highest education level: Not on file  Occupational History  . Occupation: works at [employer]: [employer]   Tobacco Use  . Smoking status: Current Every Day Smoker    Packs/day: 0.30    Years: 29.00    Pack years: 8.70    Types: Cigarettes    Start date: 04/09/1970  .  Smokeless tobacco: Never Used  . Tobacco comment: 5 cigarettes a day 09/20/19  Vaping Use  . Vaping Use: Never used  Substance and Sexual Activity  . Alcohol use: Not Currently  . Drug use: No  . Sexual activity: Not Currently    Birth control/protection: Post-menopausal  Other Topics Concern  . Not on file  Social History Narrative  . Not on file   Social Determinants of Health   Financial Resource Strain:   . Difficulty of Paying Living Expenses: Not on file  Food Insecurity:   . Worried About Charity fundraiser in the Last Year: Not on file  . Ran Out of Food in the Last Year: Not on file  Transportation Needs:   . Lack of Transportation (Medical): Not on file  . Lack of Transportation (Non-Medical): Not on file  Physical Activity:   . Days of Exercise per Week: Not on file  . Minutes of Exercise per Session: Not on file  Stress:   . Feeling of Stress : Not on file  Social Connections:   . Frequency of Communication with Friends and Family: Not on file  . Frequency of Social Gatherings with Friends and Family: Not on file  . Attends Religious Services: Not on file  . Active Member of Clubs or Organizations: Not on file  . Attends Archivist Meetings: Not on file  . Marital Status: Not on file  Intimate Partner Violence:   . Fear of Current or Ex-Partner: Not on file  . Emotionally Abused: Not on file  . Physically Abused: Not on file  . Sexually Abused: Not on file     ROS- All systems are reviewed and negative except as per the HPI  above.  Physical Exam: Vitals:   10/01/19 1039  BP: (!) 106/52  Pulse: (!) 53  Weight: 131.3 kg  Height: 5' 5" (1.651 m)    GEN- The patient is well appearing obese female, alert and oriented x 3 today.   HEENT-head normocephalic, atraumatic, sclera clear, conjunctiva pink, hearing intact, trachea midline. Lungs- Clear to ausculation bilaterally, normal work of breathing Heart- Regular rate and rhythm, bradycardia, no murmurs, rubs or gallops  GI- soft, NT, ND, + BS Extremities- no clubbing, cyanosis, or edema MS- no significant deformity or atrophy Skin- no rash or lesion Psych- euthymic mood, full affect Neuro- strength and sensation are intact   Wt Readings from Last 3 Encounters:  10/01/19 131.3 kg  09/23/19 124.3 kg  09/20/19 126.3 kg    EKG today demonstrates SB HR 53, PR 138, QRS 76, QTc 426  Echo 04/28/19 demonstrated  1. Left ventricular ejection fraction, by estimation, is 55 to 60%. Left  ventricular ejection fraction by 3D volume is 58 %. The left ventricle has  normal function. The left ventricle has no regional wall motion  abnormalities. The left ventricular  internal cavity size was mildly dilated. There is mild left ventricular  hypertrophy. Left ventricular diastolic parameters are indeterminate.  2. Right ventricular systolic function is normal. The right ventricular  size is mildly enlarged. There is mildly elevated pulmonary artery  systolic pressure. The estimated right ventricular systolic pressure is  94.8 mmHg.  3. The mitral valve is normal in structure. Mild mitral valve  regurgitation.  4. The aortic valve is tricuspid. Aortic valve regurgitation is not  visualized. Mild aortic stenosis. Vmax 2.8 m/s, MG 32mHg, AVA 1.4 cm^2,  DI 0.55  5. Aortic dilatation noted. There is mild dilatation of the  ascending  aorta measuring 36 mm.  6. The inferior vena cava is dilated in size with <50% respiratory  variability, suggesting right atrial  pressure of 15 mmHg.   Epic records are reviewed at length today  CHA2DS2-VASc Score = 3  The patient's score is based upon: CHF History: 0 HTN History: 1 Age : 0 Diabetes History: 0 Stroke History: 0 Vascular Disease History: 1 Gender: 1      ASSESSMENT AND PLAN: 1. Persistent Atrial Fibrillation (ICD10:  I48.19) S/p dofetilide loading 8/9-8/12/21 Patient appears to be maintaining SR. Continue dofetilide 250 mcg BID. QT stable. Check bmet/mag today. Continue Xarelto 20 mg daily  2. Secondary Hypercoagulable State (ICD10:  D68.69) The patient is at significant risk for stroke/thromboembolism based upon her CHA2DS2-VASc Score of 3.  Continue Rivaroxaban (Xarelto).   3. Obesity Body mass index is 48.16 kg/m. Lifestyle modification was discussed and encouraged including regular physical activity and weight reduction.  4. CAD S/p PCI to RCA 04/27/19. No anginal symptoms.  5. HTN Stable, no changes today.   Follow up with Tommye Standard as scheduled. AF clinic in 4 months.    [facility] [address] [place], [place] [address] [phone]

## 2019-10-05 ENCOUNTER — Telehealth (HOSPITAL_COMMUNITY): Payer: Self-pay | Admitting: *Deleted

## 2019-10-05 NOTE — Telephone Encounter (Signed)
Pt having issues with her blood pressure dropping currently 98/53 feeling slightly lightheaded. Discuss with Adline Peals PA will stop amlodipine. Pt will continue to monitor her BP and let us know how BP responds in few days. Pt in agreement.

## 2019-11-01 NOTE — Progress Notes (Signed)
Cardiology Office Note Date:  11/03/2019  Patient ID:  Avalee, Castrellon 16-Oct-1955, MRN [id number] PCP:  Asencion Noble, MD  Cardiologist:  Dr. Harl Bowie Electrophysiologist: Dr. Rayann Heman    Chief Complaint: post Tikosyn  History of Present Illness: Amy Tran is a 64 y.o. female with history of CAD (NSTEMI 04/25/2019 w/DES to proximal RCA), HTN, HLD, L iliac thrombosis (follows with Dr. Oneida Alar), DM,  AFib   She comes in today to be seen for Dr. Rayann Heman, now post Tikosyn initiation.  Her QT lengthened and her dose required reduction. She had her AFib clnic follow up 10/01/2019, she was feeling well, was in SR, QT was OK, labs as well.  TODAY She is feeling well No CP, palpitations or cardiac awareness No dizzy spells, near syncope or syncope. She has some degree of baseline DOE that is unchanged, no symptoms of PND or orthopnea. No bleeding or signs of bleeding  She had one night that she had a terrible hot flash, just could not cool down, no CP, no palpitations, no SOB, her daughter got worried and called EMS, she reports her vitals were said to [facility] as well as her EKG.  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   No changes to her exertional capacity, no formal exercise  She has chronic LE edema, follows with Dr. Oneida Alar, this is at her baseline she states as well, unchanged for years, always L>R    AFib Hx: Diagnosed at time of her NSTEMI March 2021 DCCV April w/ERAF  AAD Hx Multaq d/c with large AFib burden  Tikosyn started 09/20/2019    Past Medical History:  Diagnosis Date   Arthritis    Asthma    Cancer ([place])    multiple skin cancers   Diabetes mellitus without  complication ([place])    Diverticulosis    Fluid retention    GERD (gastroesophageal reflux disease)    High cholesterol    Hypertension    Incomplete rotator cuff tear    Myocardial infarction ([place])    Obesity    Persistent atrial fibrillation ([facility])    PONV (postoperative nausea and vomiting)    Wears glasses    Wears partial dentures    bottom    Past Surgical History:  Procedure Laterality Date   APPENDECTOMY     CARDIOVERSION N/A 06/03/2019   Procedure: CARDIOVERSION;  Surgeon: Herminio Commons, MD;  Location: [facility];  Service: Cardiovascular;  Laterality: N/A;   CARDIOVERSION N/A 07/13/2019   Procedure: CARDIOVERSION;  Surgeon: Geralynn Rile, MD;  Location: [place];  Service: Cardiovascular;  Laterality: N/A;   CARPAL TUNNEL RELEASE Bilateral 2001   bilateral   CERVICAL POLYPECTOMY  04/08/2017   Procedure: POLYPECTOMY;  Surgeon: [name], MD;  Location: [facility];  Service: Gynecology;;  Endometrial   CHOLECYSTECTOMY     COLONOSCOPY  2007   [id number] internal hemorrhoids. Diminutive rectal polyp at 10 cm, cold biopsied/removed. The remainder of the rectal mucosa appeared normal Swallow left-sided diverticula. Diminutive polyp at the splenic flexure cold biopsied/removed (adenomatous)   COLONOSCOPY N/A 12/28/2013   Procedure: COLONOSCOPY;  Surgeon: Herbie Baltimore  Hilton Cork, MD;  [facility];  Service: Endoscopy;  Laterality: N/A;  730 - moved to 8:30 - Ginger notified pt   CORONARY STENT INTERVENTION N/A 04/27/2019   Procedure: CORONARY STENT INTERVENTION;  Surgeon: Troy Sine, MD;  Location: [place] [facility];  Service: Cardiovascular;  Laterality: N/A;   DILATION AND CURETTAGE, DIAGNOSTIC / THERAPEUTIC  03/07/2017   ERCP  2002   GASTRIC BYPASS  2004   HYSTEROSCOPY WITH D & C N/A 04/08/2017   Procedure: DILATATION AND CURETTAGE /HYSTEROSCOPY;  Surgeon: [name], MD;  Location: [facility];  Service: Gynecology;  Laterality:  N/A;   INTRAVASCULAR ULTRASOUND/IVUS N/A 09/12/2017   Procedure: INTRAVASCULAR ULTRASOUND/IVUS;  Surgeon: Elam Dutch, MD;  Location: [place] [facility];  Service: Cardiovascular;  Laterality: N/A;   LEFT HEART CATH AND CORONARY ANGIOGRAPHY N/A 04/27/2019   Procedure: LEFT HEART CATH AND CORONARY ANGIOGRAPHY;  Surgeon: Troy Sine, MD;  Location: [place] [facility];  Service: Cardiovascular;  Laterality: N/A;   NECK SURGERY  1998   fusion   PERCUTANEOUS VENOUS THROMBECTOMY,LYSIS WITH INTRAVASCULAR ULTRASOUND (IVUS) Left 09/13/2017   Procedure: REMOVAL OF LYSIS CATHETER AND INTRAVASCULAR ULTRASOUND (IVUS) LEFT ILIAC VEIN;  Surgeon: Elam Dutch, MD;  Location: [place];  Service: Vascular;  Laterality: Left;   PERIPHERAL VASCULAR INTERVENTION  09/15/2017   Procedure: PERIPHERAL VASCULAR INTERVENTION;  Surgeon: Waynetta Sandy, MD;  Location: [place] [facility];  Service: Cardiovascular;;  VEINOUS/STENT   PERIPHERAL VASCULAR THROMBECTOMY Left 09/12/2017   Procedure: PERIPHERAL VASCULAR THROMBECTOMY;  Surgeon: Elam Dutch, MD;  Location: [place] [facility];  Service: Cardiovascular;  Laterality: Left;   PERIPHERAL VASCULAR THROMBECTOMY Left 09/15/2017   Procedure: PERIPHERAL VASCULAR THROMBECTOMY;  Surgeon: Waynetta Sandy, MD;  Location: [place] [facility];  Service: Cardiovascular;  Laterality: Left;  IVC TO LT FEM/POP VEIN   SHOULDER ARTHROSCOPY WITH SUBACROMIAL DECOMPRESSION Left 06/23/2013   Procedure: LEFT SHOULDER ARTHROSCOPY WITH DEBRIDEMENT ROTATOR CUFF AND LABRUM;  Surgeon: Lorn Junes, MD;  Location: [place];  Service: Orthopedics;  Laterality: Left;   SHOULDER SURGERY  1999   left    Current Outpatient Medications  Medication Sig Dispense Refill   albuterol (VENTOLIN HFA) 108 (90 Base) MCG/ACT inhaler Inhale 2 puffs into the lungs every 6 (six) hours as needed for wheezing or shortness of breath.     atorvastatin (LIPITOR) 80  MG tablet Take 1 tablet (80 mg total) by mouth daily at 6 PM. (Patient taking differently: Take 80 mg by mouth at bedtime. ) 90 tablet 1   BD PEN NEEDLE NANO U/F 32G X 4 MM MISC USE FOR VICTOZA INJECTIONS     cetirizine (ZYRTEC) 10 MG tablet Take 10 mg by mouth at bedtime.      clopidogrel (PLAVIX) 75 MG tablet Take 1 tablet (75 mg total) by mouth daily with breakfast. 90 tablet 3   Cyanocobalamin 1500 MCG TBDP Take 1,500 mcg by mouth daily with breakfast. Vitamin B12     dofetilide (TIKOSYN) 250 MCG capsule Take 1 capsule (250 mcg total) by mouth 2 (two) times daily. 60 capsule 6   fluticasone (FLONASE) 50 MCG/ACT nasal spray Place 2 sprays into both nostrils daily as needed for rhinitis (congestion/ dryness).      losartan (COZAAR) 100 MG tablet Take 100 mg by mouth daily with breakfast.      nicotine (NICODERM CQ - DOSED IN MG/24 HOURS) 14 mg/24hr patch Place 1 patch (  14 mg total) onto the skin daily. 14 patch 0   nicotine (NICODERM CQ - DOSED IN MG/24 HOURS) 21 mg/24hr patch Place 1 patch (21 mg total) onto the skin daily. 42 patch 0   nicotine (NICODERM CQ - DOSED IN MG/24 HR) 7 mg/24hr patch Place 1 patch (7 mg total) onto the skin daily. 14 patch 0   Nicotine 21-14-7 MG/24HR KIT Apply 1 patch topically daily.      nitroGLYCERIN (NITROSTAT) 0.4 MG SL tablet Place 1 tablet (0.4 mg total) under the tongue every 5 (five) minutes as needed for chest pain. 30 tablet 0   potassium chloride SA (KLOR-CON) 20 MEQ tablet Take 1 tablet (20 mEq total) by mouth 2 (two) times daily.     rivaroxaban (XARELTO) 20 MG TABS tablet Take 1 tablet (20 mg total) by mouth daily with supper. (Patient taking differently: Take 20 mg by mouth at bedtime. ) 90 tablet 3   spironolactone (ALDACTONE) 25 MG tablet Take 25 mg by mouth daily with breakfast.      torsemide (DEMADEX) 20 MG tablet Take 60 mg by mouth daily after breakfast.      VICTOZA 18 MG/3ML SOPN Inject 1.8 mg into the skin at bedtime.       No current facility-administered medications for this visit.    Allergies:   Penicillins   Social History:  The patient  reports that she has been smoking cigarettes. She started smoking about 49 years ago. She has a 8.70 pack-year smoking history. She has never used smokeless tobacco. She reports previous alcohol use. She reports that she does not use drugs.   Family History:  The patient's family history includes Cancer in her daughter and father; Diabetes in her brother and father; Drug abuse in her sister; Heart disease in her mother; Hypertension in her brother, father, and mother; Mesothelioma in her brother; Parkinson's disease in her father.  ROS:  Please see the history of present illness.    All other systems are reviewed and otherwise negative.   PHYSICAL EXAM:  VS:  BP 124/62    Pulse (!) 57    Ht 5' 4"  (1.626 m)    Wt 292 lb (132.5 kg)    SpO2 96%    BMI 50.12 kg/m  BMI: Body mass index is 50.12 kg/m. Well nourished, well developed, in no acute distress HEENT: normocephalic, atraumatic Neck: no JVD, carotid bruits or masses Cardiac:  RRR; no significant murmurs, no rubs, or gallops Lung:  CTA b/l, no wheezing, rhonchi or rales Abd: soft, nontender MS: no deformity or atrophy Ext: significant b/l LE edema to mid calf, L>R, with some mild chronic looking skin changes.  No wounds noted Skin: warm and dry, no rash Neuro:  No gross deficits appreciated Psych: euthymic mood, full affect     EKG:  Done today and reviewed by myself shows  SB 57bpm, QT 481m, QTc 4458m 04/28/2019: TTE IMPRESSIONS  1. Left ventricular ejection fraction, by estimation, is 55 to 60%. Left  ventricular ejection fraction by 3D volume is 58 %. The left ventricle has  normal function. The left ventricle has no regional wall motion  abnormalities. The left ventricular  internal cavity size was mildly dilated. There is mild left ventricular  hypertrophy. Left ventricular diastolic parameters  are indeterminate.  2. Right ventricular systolic function is normal. The right ventricular  size is mildly enlarged. There is mildly elevated pulmonary artery  systolic pressure. The estimated right ventricular systolic pressure is  35.2 mmHg.  3. The mitral valve is normal in structure. Mild mitral valve  regurgitation.  4. The aortic valve is tricuspid. Aortic valve regurgitation is not  visualized. Mild aortic stenosis. Vmax 2.8 m/s, MG 53mHg, AVA 1.4 cm^2,  DI 0.55  5. Aortic dilatation noted. There is mild dilatation of the ascending  aorta measuring 36 mm.  6. The inferior vena cava is dilated in size with <50% respiratory  variability, suggesting right atrial pressure of 15 mmHg.     04/27/2019: LHC/PCI  Prox RCA lesion is 95% stenosed.  Post intervention, there is a 0% residual stenosis.  A stent was successfully placed.   Acute coronary syndrome secondary to high-grade thrombotic stenosis in the very proximal RCA in a dominant RCA vessel. Normal left coronary circulation with a short left main, large LAD and left circumflex vessels. Dominant RCA with 95% very proximal stenosis with thrombus burden with TIMI-3 flow.  Successful percutaneous coronary prevention with PTCA and ultimate stenting with a 3.0 x18 mm Resolute Onyx DES stent postdilated to 3.73 mm tapering to 3.68 mm with the percent stenosis being reduced to 0% and brisk TIMI-3 flow without evidence for dissection.  RECOMMENDATION: Consider initial triple drug therapy for 1 month with continuation of Plavix/Xarelto for at least 6 months in this patient on long-term anticoagulation therapy.   Recent Labs: 01/03/2019: B Natriuretic Peptide 138.0 06/01/2019: ALT 13; TSH 0.567 07/06/2019: Hemoglobin 12.5; Platelets 347 10/01/2019: BUN 13; Creatinine, Ser 0.74; Magnesium 2.0; Potassium 4.1; Sodium 136  04/26/2019: Cholesterol 174; HDL 41; LDL Cholesterol 111; Total CHOL/HDL Ratio 4.2; Triglycerides 110; VLDL 22    CrCl cannot be calculated (Patient's most recent lab result is older than the maximum 21 days allowed.).   Wt Readings from Last 3 Encounters:  11/03/19 292 lb (132.5 kg)  10/01/19 289 lb 6.4 oz (131.3 kg)  09/23/19 274 lb (124.3 kg)     Other studies reviewed: Additional studies/records reviewed today include: summarized above  ASSESSMENT AND PLAN:  1. Persistent AFib     CHA2DS2Vasc is 4, on Xarelto, appropriately dosed     Tikosyn QTc stable      Teaching completed      Med list reviewed, noted benadryl, she says that was recommended for occasional itching of her palms, though has never helped her and does not use it.  She was instructed not to take and advised to dicuss with her PMD the iticing and any alternatives  She reports being tested for sleep apnea and was negative Encouraged walking for exercise to her capacity  2. CAD     No anginal symptoms      C/w with Dr. BHarl Bowie 3. HTN     Looks good  4. HLD     Not addressed today    Disposition: BMET and mag today, F/u with EP in [id number] if needed.   Current medicines are reviewed at length with the patient today.  The patient did not have any concerns regarding medicines.  SiVenetia NightPA-C 11/03/2019 11:17 AM     [facility] [address]  [phone])  [phone])

## 2019-11-03 ENCOUNTER — Other Ambulatory Visit: Payer: Self-pay

## 2019-11-03 ENCOUNTER — Ambulatory Visit (INDEPENDENT_AMBULATORY_CARE_PROVIDER_SITE_OTHER): Payer: Medicaid Other | Admitting: Physician Assistant

## 2019-11-03 VITALS — BP 124/62 | HR 57 | Ht 64.0 in | Wt 292.0 lb

## 2019-11-03 DIAGNOSIS — Z79899 Other long term (current) drug therapy: Secondary | ICD-10-CM | POA: Diagnosis not present

## 2019-11-03 DIAGNOSIS — I10 Essential (primary) hypertension: Secondary | ICD-10-CM | POA: Diagnosis not present

## 2019-11-03 DIAGNOSIS — I4819 Other persistent atrial fibrillation: Secondary | ICD-10-CM | POA: Diagnosis not present

## 2019-11-03 DIAGNOSIS — Z5181 Encounter for therapeutic drug level monitoring: Secondary | ICD-10-CM

## 2019-11-03 DIAGNOSIS — I2510 Atherosclerotic heart disease of native coronary artery without angina pectoris: Secondary | ICD-10-CM

## 2019-11-03 LAB — BASIC METABOLIC PANEL
BUN/Creatinine Ratio: 14 (ref 12–28)
BUN: 9 mg/dL (ref 8–27)
CO2: 24 mmol/L (ref 20–29)
Calcium: 9.3 mg/dL (ref 8.7–10.3)
Chloride: 101 mmol/L (ref 96–106)
Creatinine, Ser: 0.66 mg/dL (ref 0.57–1.00)
GFR calc Af Amer: 108 mL/min/{1.73_m2} (ref >59)
GFR calc non Af Amer: 94 mL/min/{1.73_m2} (ref >59)
Glucose: 159 mg/dL — ABNORMAL HIGH (ref 65–99)
Potassium: 4.4 mmol/L (ref 3.5–5.2)
Sodium: 137 mmol/L (ref 134–144)

## 2019-11-03 LAB — MAGNESIUM: Magnesium: 2 mg/dL (ref 1.6–2.3)

## 2019-11-03 NOTE — Patient Instructions (Signed)
Medication Instructions:  Your physician recommends that you continue on your current medications as directed. Please refer to the Current Medication list given to you today.  *If you need a refill on your cardiac medications before your next appointment, please call your pharmacy*   Lab Work: BMET AND [place]   If you have labs (blood work) drawn today and your tests are completely normal, you will receive your results only by: Marland Kitchen MyChart Message (if you have MyChart) OR . A paper copy in the mail If you have any lab test that is abnormal or we need to change your treatment, we will call you to review the results.   Testing/Procedures:NONE ORDERED  TODAY   Follow-Up: At [facility], you and your health needs are our priority.  As part of our continuing mission to provide you with exceptional heart care, we have created designated Provider Care Teams.  These Care Teams include your primary Cardiologist (physician) and Advanced Practice Providers (APPs -  Physician Assistants and Nurse Practitioners) who all work together to provide you with the care you need, when you need it.  We recommend signing up for the patient portal called "MyChart".  Sign up information is provided on this After Visit Summary.  MyChart is used to connect with patients for Virtual Visits (Telemedicine).  Patients are able to view lab/test results, encounter notes, upcoming appointments, etc.  Non-urgent messages can be sent to your provider as well.   To learn more about what you can do with MyChart, go to [email or web address].    Your next appointment:   6 month(s)  The format for your next appointment:   In Person  Provider:   You may see Dr. Rayann Heman  or one of the following Advanced Practice Providers on your designated Care Team:    Chanetta Marshall, NP  Tommye Standard, PA-C  Legrand Como "Oda Kilts, [place]    Other Instructions

## 2019-11-08 ENCOUNTER — Ambulatory Visit: Payer: Medicaid Other | Admitting: Obstetrics and Gynecology

## 2019-11-08 ENCOUNTER — Telehealth: Payer: Self-pay | Admitting: Women's Health

## 2019-11-08 NOTE — Telephone Encounter (Signed)
Pt was called to reschedule her appointment because Dr. Glo Herring will not be in the office, Pt states that her boils popped and she would like to know if she could get some medication called into her pharmacy to help her heal. Pt states that she uses [facility]. Please contact pt

## 2019-11-10 ENCOUNTER — Ambulatory Visit (INDEPENDENT_AMBULATORY_CARE_PROVIDER_SITE_OTHER): Payer: Medicaid Other | Admitting: Advanced Practice Midwife

## 2019-11-10 ENCOUNTER — Encounter: Payer: Self-pay | Admitting: Advanced Practice Midwife

## 2019-11-10 VITALS — BP 142/63 | HR 56 | Ht 65.0 in | Wt 288.0 lb

## 2019-11-10 DIAGNOSIS — N764 Abscess of vulva: Secondary | ICD-10-CM | POA: Diagnosis not present

## 2019-11-10 MED ORDER — CLINDAMYCIN HCL 300 MG PO CAPS
300.0000 mg | ORAL_CAPSULE | Freq: Three times a day (TID) | ORAL | 0 refills | Status: DC
Start: 2019-11-10 — End: 2020-01-11

## 2019-11-10 NOTE — Progress Notes (Signed)
   GYN VISIT Patient name: Amy Tran MRN [id number]  Date of birth: 05-06-1955 Chief Complaint:   Knot in Vaginal Area  History of Present Illness:   Amy Tran is a 64 y.o. G40P2002 Caucasian female being seen today for vulvar/clitoral boils x 2 that appears approx 1 wk ago. The L vulvar one was larger than the clitoral one; both 'popped' recently and she feels much better.     Depression screen [facility] 2/9 05/21/2019 02/26/2019  Decreased Interest 0 0  Down, Depressed, Hopeless 0 0  PHQ - 2 Score 0 0  Altered sleeping 1 -  Tired, decreased energy 1 -  Change in appetite 0 -  Feeling bad or failure about yourself  0 -  Trouble concentrating 0 -  Moving slowly or fidgety/restless 0 -  Suicidal thoughts 0 -  PHQ-9 Score 2 -  Difficult doing work/chores Somewhat difficult -    No LMP recorded. Patient is postmenopausal. The current method of family planning is post menopausal status.  Last pap Jan 2021. Results were:  normal Review of Systems:   Pertinent items are noted in HPI Denies fever/chills, dizziness, headaches, visual disturbances, fatigue, shortness of breath, chest pain, abdominal pain, vomiting, abnormal vaginal discharge/itching/odor/irritation, problems with periods, bowel movements, urination, or intercourse unless otherwise stated above.  Pertinent History Reviewed:  Reviewed past medical,surgical, social, obstetrical and family history.  Reviewed problem list, medications and allergies. Physical Assessment:   Vitals:   11/10/19 0904  BP: (!) 142/63  Pulse: (!) 56  Weight: 288 lb (130.6 kg)  Height: 5\' 5"  (1.651 m)  Body mass index is 47.93 kg/m.       Physical Examination:   General appearance: alert, well appearing, and in no distress  Mental status: alert, oriented to person, place, and time  Skin: warm & dry   Cardiovascular: normal heart rate noted  Respiratory: normal respiratory effort, no distress  Abdomen: soft, non-tender   Pelvic:  normal external genitalia, vulva, vagina, cervix, uterus and adnexa; healing furuncles approx pea-sized at L vulva and in clitoral area; sm drainage/erythema  Extremities: no edema   Chaperone: n/a    No results found for this or any previous visit (from the past 24 hour(s)).  Assessment & Plan:  1) Furuncles> rx Clindamycin 300mg  tid x 5d as prevention; call if symptoms worsen  2) Rectocele and enterocele, per Dr Johnnye Sima note Jan 2021- she feels it is becoming more difficult to have BMs and is requesting eval for surgery   Meds:  Meds ordered this encounter  Medications  . clindamycin (CLEOCIN) 300 MG capsule    Sig: Take 1 capsule (300 mg total) by mouth 3 (three) times daily.    Dispense:  15 capsule    Refill:  0    Order Specific Question:   Supervising Provider    Answer:   Elonda Husky, LUTHER H [[id number]]    No orders of the defined types were placed in this encounter.   Return for Dr Elonda Husky; when fits schedule for eval of cystocele/rectocele.  Myrtis Ser CNM 11/10/2019 9:31 AM

## 2019-11-10 NOTE — Patient Instructions (Signed)
Skin Abscess  A skin abscess is an infected area on or under your skin that contains a collection of pus and other material. An abscess may also be called a furuncle, carbuncle, or boil. An abscess can occur in or on almost any part of your body. Some abscesses break open (rupture) on their own. Most continue to get worse unless they are treated. The infection can spread deeper into the body and eventually into your blood, which can make you feel ill. Treatment usually involves draining the abscess. What are the causes? An abscess occurs when germs, like bacteria, pass through your skin and cause an infection. This may be caused by:  A scrape or cut on your skin.  A puncture wound through your skin, including a needle injection or insect bite.  Blocked oil or sweat glands.  Blocked and infected hair follicles.  A cyst that forms beneath your skin (sebaceous cyst) and becomes infected. What increases the risk? This condition is more likely to develop in people who:  Have a weak body defense system (immune system).  Have diabetes.  Have dry and irritated skin.  Get frequent injections or use illegal IV drugs.  Have a foreign body in a wound, such as a splinter.  Have problems with their lymph system or veins. What are the signs or symptoms? Symptoms of this condition include:  A painful, firm bump under the skin.  A bump with pus at the top. This may break through the skin and drain. Other symptoms include:  Redness surrounding the abscess site.  Warmth.  Swelling of the lymph nodes (glands) near the abscess.  Tenderness.  A sore on the skin. How is this diagnosed? This condition may be diagnosed based on:  A physical exam.  Your medical history.  A sample of pus. This may be used to find out what is causing the infection.  Blood tests.  Imaging tests, such as an ultrasound, CT scan, or MRI. How is this treated? A small abscess that drains on its own may  not need treatment. Treatment for larger abscesses may include:  Moist heat or heat pack applied to the area several times a day.  A procedure to drain the abscess (incision and drainage).  Antibiotic medicines. For a severe abscess, you may first get antibiotics through an IV and then change to antibiotics by mouth. Follow these instructions at home: Medicines   Take over-the-counter and prescription medicines only as told by your health care provider.  If you were prescribed an antibiotic medicine, take it as told by your health care provider. Do not stop taking the antibiotic even if you start to feel better. Abscess care   If you have an abscess that has not drained, apply heat to the affected area. Use the heat source that your health care provider recommends, such as a moist heat pack or a heating pad. ? Place a towel between your skin and the heat source. ? Leave the heat on for 20-30 minutes. ? Remove the heat if your skin turns bright red. This is especially important if you are unable to feel pain, heat, or cold. You may have a greater risk of getting burned.  Follow instructions from your health care provider about how to take care of your abscess. Make sure you: ? Cover the abscess with a bandage (dressing). ? Change your dressing or gauze as told by your health care provider. ? Wash your hands with soap and water before you change the   dressing or gauze. If soap and water are not available, use hand sanitizer.  Check your abscess every day for signs of a worsening infection. Check for: ? More redness, swelling, or pain. ? More fluid or blood. ? Warmth. ? More pus or a bad smell. General instructions  To avoid spreading the infection: ? Do not share personal care items, towels, or hot tubs with others. ? Avoid making skin contact with other people.  Keep all follow-up visits as told by your health care provider. This is important. Contact a health care provider if  you have:  More redness, swelling, or pain around your abscess.  More fluid or blood coming from your abscess.  Warm skin around your abscess.  More pus or a bad smell coming from your abscess.  A fever.  Muscle aches.  Chills or a general ill feeling. Get help right away if you:  Have severe pain.  See red streaks on your skin spreading away from the abscess. Summary  A skin abscess is an infected area on or under your skin that contains a collection of pus and other material.  A small abscess that drains on its own may not need treatment.  Treatment for larger abscesses may include having a procedure to drain the abscess and taking an antibiotic. This information is not intended to replace advice given to you by your health care provider. Make sure you discuss any questions you have with your health care provider. Document Revised: 05/21/2018 Document Reviewed: 03/13/2017 Elsevier Patient Education  2020 [employer]  

## 2019-11-23 ENCOUNTER — Ambulatory Visit (INDEPENDENT_AMBULATORY_CARE_PROVIDER_SITE_OTHER): Payer: Medicaid Other | Admitting: Obstetrics & Gynecology

## 2019-11-23 ENCOUNTER — Encounter: Payer: Self-pay | Admitting: Obstetrics & Gynecology

## 2019-11-23 VITALS — BP 151/70 | HR 53 | Ht 64.0 in | Wt 284.0 lb

## 2019-11-23 DIAGNOSIS — F17200 Nicotine dependence, unspecified, uncomplicated: Secondary | ICD-10-CM | POA: Diagnosis not present

## 2019-11-23 DIAGNOSIS — N816 Rectocele: Secondary | ICD-10-CM

## 2019-11-23 DIAGNOSIS — Z6841 Body Mass Index (BMI) 40.0 and over, adult: Secondary | ICD-10-CM | POA: Diagnosis not present

## 2019-11-23 MED ORDER — POLYETHYLENE GLYCOL 3350 17 GM/SCOOP PO POWD: ORAL | 11 refills | Status: DC

## 2019-11-23 NOTE — Progress Notes (Signed)
Chief Complaint  Patient presents with  . evaluation of cystocele/rectocele      64 y.o. [id number] No LMP recorded. Patient is postmenopausal. The current method of family planning is post menopausal status.  Outpatient Encounter Medications as of 11/23/2019  Medication Sig Note  . albuterol (VENTOLIN HFA) 108 (90 Base) MCG/ACT inhaler Inhale 2 puffs into the lungs every 6 (six) hours as needed for wheezing or shortness of breath.   Marland Kitchen atorvastatin (LIPITOR) 80 MG tablet Take 1 tablet (80 mg total) by mouth daily at 6 PM. (Patient taking differently: Take 80 mg by mouth at bedtime. )   . BD PEN NEEDLE NANO U/F 32G X 4 MM MISC USE FOR VICTOZA INJECTIONS   . cetirizine (ZYRTEC) 10 MG tablet Take 10 mg by mouth at bedtime.    . clindamycin (CLEOCIN) 300 MG capsule Take 1 capsule (300 mg total) by mouth 3 (three) times daily.   . clopidogrel (PLAVIX) 75 MG tablet Take 1 tablet (75 mg total) by mouth daily with breakfast.   . Cyanocobalamin 1500 MCG TBDP Take 1,500 mcg by mouth daily with breakfast. Vitamin B12   . dofetilide (TIKOSYN) 250 MCG capsule Take 1 capsule (250 mcg total) by mouth 2 (two) times daily.   . fluticasone (FLONASE) 50 MCG/ACT nasal spray Place 2 sprays into both nostrils daily as needed for rhinitis (congestion/ dryness).    Marland Kitchen losartan (COZAAR) 100 MG tablet Take 100 mg by mouth daily with breakfast.    . nitroGLYCERIN (NITROSTAT) 0.4 MG SL tablet Place 1 tablet (0.4 mg total) under the tongue every 5 (five) minutes as needed for chest pain.   . potassium chloride SA (KLOR-CON) 20 MEQ tablet Take 1 tablet (20 mEq total) by mouth 2 (two) times daily.   . rivaroxaban (XARELTO) 20 MG TABS tablet Take 1 tablet (20 mg total) by mouth daily with supper. (Patient taking differently: Take 20 mg by mouth at bedtime. )   . spironolactone (ALDACTONE) 25 MG tablet Take 25 mg by mouth daily with breakfast.    . torsemide (DEMADEX) 20 MG tablet Take 60 mg by mouth daily after  breakfast.    . VICTOZA 18 MG/3ML SOPN Inject 1.8 mg into the skin at bedtime.    . nicotine (NICODERM CQ - DOSED IN MG/24 HOURS) 14 mg/24hr patch Place 1 patch (14 mg total) onto the skin daily. (Patient not taking: Reported on 11/10/2019) 05/26/2019: Hasnt started  . nicotine (NICODERM CQ - DOSED IN MG/24 HOURS) 21 mg/24hr patch Place 1 patch (21 mg total) onto the skin daily. (Patient not taking: Reported on 11/10/2019) 09/20/2019: Pt has not decided when she will start these - trying to quit smoking without the patches  . nicotine (NICODERM CQ - DOSED IN MG/24 HR) 7 mg/24hr patch Place 1 patch (7 mg total) onto the skin daily. (Patient not taking: Reported on 11/10/2019) 09/20/2019: Pt has not decided when she will start these - trying to quit smoking without the patches  . Nicotine 21-14-7 MG/24HR KIT Apply 1 patch topically daily.  (Patient not taking: Reported on 11/10/2019) 09/20/2019: Pt has not decided when she will start these - trying to quit smoking without the patches  . polyethylene glycol powder (GLYCOLAX/MIRALAX) 17 GM/SCOOP powder 1 scoop daily or as needed    No facility-administered encounter medications on file as of 11/23/2019.    Subjective Pt with worsening symptoms from her rectocoele that Dr Glo Herring diagnosed last January Pt strains with BM,  avg 2 per day not hard Does not splint currently Past Medical History:  Diagnosis Date  . Arthritis   . Asthma   . Cancer [facility])    multiple skin cancers  . Diabetes mellitus without complication ([place])   . Diverticulosis   . Fluid retention   . GERD (gastroesophageal reflux disease)   . High cholesterol   . Hypertension   . Incomplete rotator cuff tear   . Myocardial infarction ([place])   . Obesity   . Persistent atrial fibrillation ([place])   . PONV (postoperative nausea and vomiting)   . Wears glasses   . Wears partial dentures    bottom    Past Surgical History:  Procedure Laterality Date  . APPENDECTOMY    . CARDIOVERSION N/A  06/03/2019   Procedure: CARDIOVERSION;  Surgeon: Herminio Commons, MD;  Location: [facility];  Service: Cardiovascular;  Laterality: N/A;  . CARDIOVERSION N/A 07/13/2019   Procedure: CARDIOVERSION;  Surgeon: Geralynn Rile, MD;  Location: [place];  Service: Cardiovascular;  Laterality: N/A;  . CARPAL TUNNEL RELEASE Bilateral 2001   bilateral  . CERVICAL POLYPECTOMY  04/08/2017   Procedure: POLYPECTOMY;  Surgeon: [name], MD;  Location: [facility];  Service: Gynecology;;  Endometrial  . CHOLECYSTECTOMY    . COLONOSCOPY  2007   BPZ:WCHENID internal hemorrhoids. Diminutive rectal polyp at 10 cm, cold biopsied/removed. The remainder of the rectal mucosa appeared normal Swallow left-sided diverticula. Diminutive polyp at the splenic flexure cold biopsied/removed (adenomatous)  . COLONOSCOPY N/A 12/28/2013   Procedure: COLONOSCOPY;  Surgeon: Daneil Dolin, MD;  [facility];  Service: Endoscopy;  Laterality: N/A;  730 - moved to 8:30 - Ginger notified pt  . CORONARY STENT INTERVENTION N/A 04/27/2019   Procedure: CORONARY STENT INTERVENTION;  Surgeon: Troy Sine, MD;  Location: [place] [facility];  Service: Cardiovascular;  Laterality: N/A;  . DILATION AND CURETTAGE, DIAGNOSTIC / THERAPEUTIC  03/07/2017  . ERCP  2002  . GASTRIC BYPASS  2004  . HYSTEROSCOPY WITH D & C N/A 04/08/2017   Procedure: DILATATION AND CURETTAGE /HYSTEROSCOPY;  Surgeon: [name], MD;  Location: [facility];  Service: Gynecology;  Laterality: N/A;  . INTRAVASCULAR ULTRASOUND/IVUS N/A 09/12/2017   Procedure: INTRAVASCULAR ULTRASOUND/IVUS;  Surgeon: Elam Dutch, MD;  Location: [place] [facility];  Service: Cardiovascular;  Laterality: N/A;  . LEFT HEART CATH AND CORONARY ANGIOGRAPHY N/A 04/27/2019   Procedure: LEFT HEART CATH AND CORONARY ANGIOGRAPHY;  Surgeon: Troy Sine, MD;  Location: [place] [facility];  Service: Cardiovascular;  Laterality: N/A;  . NECK SURGERY  1998   fusion  .  PERCUTANEOUS VENOUS THROMBECTOMY,LYSIS WITH INTRAVASCULAR ULTRASOUND (IVUS) Left 09/13/2017   Procedure: REMOVAL OF LYSIS CATHETER AND INTRAVASCULAR ULTRASOUND (IVUS) LEFT ILIAC VEIN;  Surgeon: Elam Dutch, MD;  Location: [place];  Service: Vascular;  Laterality: Left;  . PERIPHERAL VASCULAR INTERVENTION  09/15/2017   Procedure: PERIPHERAL VASCULAR INTERVENTION;  Surgeon: Waynetta Sandy, MD;  Location: [place] [facility];  Service: Cardiovascular;;  VEINOUS/STENT  . PERIPHERAL VASCULAR THROMBECTOMY Left 09/12/2017   Procedure: PERIPHERAL VASCULAR THROMBECTOMY;  Surgeon: Elam Dutch, MD;  Location: [place] [facility];  Service: Cardiovascular;  Laterality: Left;  . PERIPHERAL VASCULAR THROMBECTOMY Left 09/15/2017   Procedure: PERIPHERAL VASCULAR THROMBECTOMY;  Surgeon: Waynetta Sandy, MD;  Location: [place] [facility];  Service: Cardiovascular;  Laterality: Left;  IVC TO LT FEM/POP VEIN  . SHOULDER ARTHROSCOPY WITH SUBACROMIAL DECOMPRESSION Left  06/23/2013   Procedure: LEFT SHOULDER ARTHROSCOPY WITH DEBRIDEMENT ROTATOR CUFF AND LABRUM;  Surgeon: Lorn Junes, MD;  Location: [place];  Service: Orthopedics;  Laterality: Left;  . SHOULDER SURGERY  1999   left    OB History    Gravida  2   Para  2   Term  2   Preterm      AB      Living  2     SAB      TAB      Ectopic      Multiple      Live Births  2           Allergies  Allergen Reactions  . Penicillins Hives and Rash    Has patient had a PCN reaction causing immediate rash, facial/tongue/throat swelling, SOB or lightheadedness with hypotension: Yes Has patient had a PCN reaction causing severe rash involving mucus membranes or skin necrosis: No Has patient had a PCN reaction that required hospitalization No Has patient had a PCN reaction occurring within the last 10 years: No If all of the above answers are "NO", then may proceed with Cephalosporin use.     Social  History   Socioeconomic History  . Marital status: Widowed    Spouse name: Not on file  . Number of children: 2  . Years of education: Not on file  . Highest education level: Not on file  Occupational History  . Occupation: works at [employer]: [employer]   Tobacco Use  . Smoking status: Current Every Day Smoker    Packs/day: 0.30    Years: 29.00    Pack years: 8.70    Types: Cigarettes    Start date: 04/09/1970  . Smokeless tobacco: Never Used  . Tobacco comment: 5 cigarettes a day 09/20/19  Vaping Use  . Vaping Use: Never used  Substance and Sexual Activity  . Alcohol use: Not Currently  . Drug use: No  . Sexual activity: Not Currently    Birth control/protection: Post-menopausal  Other Topics Concern  . Not on file  Social History Narrative  . Not on file   Social Determinants of Health   Financial Resource Strain:   . Difficulty of Paying Living Expenses: Not on file  Food Insecurity:   . Worried About Charity fundraiser in the Last Year: Not on file  . Ran Out of Food in the Last Year: Not on file  Transportation Needs:   . Lack of Transportation (Medical): Not on file  . Lack of Transportation (Non-Medical): Not on file  Physical Activity:   . Days of Exercise per Week: Not on file  . Minutes of Exercise per Session: Not on file  Stress:   . Feeling of Stress : Not on file  Social Connections:   . Frequency of Communication with Friends and Family: Not on file  . Frequency of Social Gatherings with Friends and Family: Not on file  . Attends Religious Services: Not on file  . Active Member of Clubs or Organizations: Not on file  . Attends Archivist Meetings: Not on file  . Marital Status: Not on file    Family History  Problem Relation Age of Onset  . Diabetes Father   . Hypertension Father   . Parkinson's disease Father   . Cancer Father        not sure what kind  . Hypertension Mother   . Heart  disease Mother   . Drug abuse Sister     . Mesothelioma Brother   . Cancer Daughter        non-hodgkins lmphoma  . Hypertension Brother   . Diabetes Brother   . Colon cancer Neg Hx   . Gastric cancer Neg Hx   . Esophageal cancer Neg Hx     Medications:       Current Outpatient Medications:  .  albuterol (VENTOLIN HFA) 108 (90 Base) MCG/ACT inhaler, Inhale 2 puffs into the lungs every 6 (six) hours as needed for wheezing or shortness of breath., Disp: , Rfl:  .  atorvastatin (LIPITOR) 80 MG tablet, Take 1 tablet (80 mg total) by mouth daily at 6 PM. (Patient taking differently: Take 80 mg by mouth at bedtime. ), Disp: 90 tablet, Rfl: 1 .  BD PEN NEEDLE NANO U/F 32G X 4 MM MISC, USE FOR VICTOZA INJECTIONS, Disp: , Rfl:  .  cetirizine (ZYRTEC) 10 MG tablet, Take 10 mg by mouth at bedtime. , Disp: , Rfl:  .  clindamycin (CLEOCIN) 300 MG capsule, Take 1 capsule (300 mg total) by mouth 3 (three) times daily., Disp: 15 capsule, Rfl: 0 .  clopidogrel (PLAVIX) 75 MG tablet, Take 1 tablet (75 mg total) by mouth daily with breakfast., Disp: 90 tablet, Rfl: 3 .  Cyanocobalamin 1500 MCG TBDP, Take 1,500 mcg by mouth daily with breakfast. Vitamin B12, Disp: , Rfl:  .  dofetilide (TIKOSYN) 250 MCG capsule, Take 1 capsule (250 mcg total) by mouth 2 (two) times daily., Disp: 60 capsule, Rfl: 6 .  fluticasone (FLONASE) 50 MCG/ACT nasal spray, Place 2 sprays into both nostrils daily as needed for rhinitis (congestion/ dryness). , Disp: , Rfl:  .  losartan (COZAAR) 100 MG tablet, Take 100 mg by mouth daily with breakfast. , Disp: , Rfl:  .  nitroGLYCERIN (NITROSTAT) 0.4 MG SL tablet, Place 1 tablet (0.4 mg total) under the tongue every 5 (five) minutes as needed for chest pain., Disp: 30 tablet, Rfl: 0 .  potassium chloride SA (KLOR-CON) 20 MEQ tablet, Take 1 tablet (20 mEq total) by mouth 2 (two) times daily., Disp: , Rfl:  .  rivaroxaban (XARELTO) 20 MG TABS tablet, Take 1 tablet (20 mg total) by mouth daily with supper. (Patient taking  differently: Take 20 mg by mouth at bedtime. ), Disp: 90 tablet, Rfl: 3 .  spironolactone (ALDACTONE) 25 MG tablet, Take 25 mg by mouth daily with breakfast. , Disp: , Rfl:  .  torsemide (DEMADEX) 20 MG tablet, Take 60 mg by mouth daily after breakfast. , Disp: , Rfl:  .  VICTOZA 18 MG/3ML SOPN, Inject 1.8 mg into the skin at bedtime. , Disp: , Rfl:  .  nicotine (NICODERM CQ - DOSED IN MG/24 HOURS) 14 mg/24hr patch, Place 1 patch (14 mg total) onto the skin daily. (Patient not taking: Reported on 11/10/2019), Disp: 14 patch, Rfl: 0 .  nicotine (NICODERM CQ - DOSED IN MG/24 HOURS) 21 mg/24hr patch, Place 1 patch (21 mg total) onto the skin daily. (Patient not taking: Reported on 11/10/2019), Disp: 42 patch, Rfl: 0 .  nicotine (NICODERM CQ - DOSED IN MG/24 HR) 7 mg/24hr patch, Place 1 patch (7 mg total) onto the skin daily. (Patient not taking: Reported on 11/10/2019), Disp: 14 patch, Rfl: 0 .  Nicotine 21-14-7 MG/24HR KIT, Apply 1 patch topically daily.  (Patient not taking: Reported on 11/10/2019), Disp: , Rfl:  .  polyethylene glycol powder (GLYCOLAX/MIRALAX) 17 GM/SCOOP  powder, 1 scoop daily or as needed, Disp: 255 g, Rfl: 11  Objective Blood pressure (!) 151/70, pulse (!) 53, height 5' 4"  (1.626 m), weight 284 lb (128.8 kg).  General WDWN female NAD Vulva:  normal appearing vulva with no masses, tenderness or lesions Vagina:  normal mucosa, no discharge, grade 3 rectocoele, grade 2 cytocoele no uterine prolapse Cervix:  Normal no lesions Uterus:  normal size, contour, position, consistency, mobility, non-tender Adnexa: ovaries:present,  normal adnexa in size, nontender and no masses   Pertinent ROS No burning with urination, frequency or urgency No nausea, vomiting or diarrhea Nor fever chills or other constitutional symptoms   Labs or studies No new    Impression Diagnoses this Encounter::   ICD-10-CM   1. Rectocele  N81.6   2. Smoker  F17.200   3. BMI 45.0-49.9, adult [facility])   Z68.42     Established relevant diagnosis(es):   Plan/Recommendations: Meds ordered this encounter  Medications  . polyethylene glycol powder (GLYCOLAX/MIRALAX) 17 GM/SCOOP powder    Sig: 1 scoop daily or as needed    Dispense:  255 g    Refill:  11    Labs or Scans Ordered: No orders of the defined types were placed in this encounter.   Management:: Due to her extensive vsculopathic medical issues including ASCVD/MI,  Smoking and BMI 49 Surgical management is not an option not just now but also in the future  Will try miralax powder first then proceed to pessary if needed but patient is informed surgery is not recommended by me given her overall health and weight  Follow up Return in about 3 months (around 02/23/2020) for Follow up, with Dr Elonda Husky.       All questions were answered.

## 2019-11-27 ENCOUNTER — Other Ambulatory Visit: Payer: Self-pay | Admitting: Cardiology

## 2019-12-12 ENCOUNTER — Other Ambulatory Visit: Payer: Self-pay | Admitting: Vascular Surgery

## 2019-12-24 ENCOUNTER — Ambulatory Visit: Payer: Medicaid Other | Admitting: Cardiology

## 2020-01-10 NOTE — Progress Notes (Signed)
Cardiology Office Note  Date: 01/11/2020   ID: Amy Tran, DOB [birth year], MRN [id number]  PCP:  Asencion Noble, MD  Cardiologist:  Carlyle Dolly, MD Electrophysiologist:  None   Chief Complaint: Follow-up atrial fibrillation  History of Present Illness: Amy Tran is a 64 y.o. female with a history of atrial fibrillation, DM 2, CAD, HTN, HLD, GERD, obesity, left iliac thrombus.  Last encounter with Tommye Standard PA 11/04/2019 for follow-up post Tikosyn initiation.  Her QT  lengthened and required reduction in dosage.  She described feeling well without chest pain, palpitations, dizziness or near syncope.  Had some baseline DOE which was unchanged.  She reported her atrial fibrillation made her extremely tired prior to starting Tikosyn.  She has chronic lower extremity edema and follow-up with Dr. Oneida Alar.  Tikosyn had been started on September 20, 2019.  CHA2DS2-VASc score was 4 on Xarelto.  QTC was stable on Tikosyn.  She denies any anginal symptoms.  Blood pressure with well controlled.  She is here today for 65-monthfollow-up.  She denies any recent acute illnesses or hospitals in the interim since last visit.  She recently had a follow-up with atrial fibrillation clinic.  She continues in normal sinus rhythm with sinus bradycardia with a rate of 57 on recent EKG at atrial fibrillation clinic.  She denies any anginal or exertional symptoms, palpitations or arrhythmias, orthostatic symptoms, PND orthopnea, bleeding issues, claudication-like symptoms, DVT or PE-like symptoms.  Continues with chronic lower extremity edema.  She has some venous stasis issues and problems with chronic thrombophlebitis.  She has been seeing Dr. FOneida Alarat vein and vascular.  She still concerned about some chronic swelling in her left lower extremity.  She plans to follow-up with Dr. FOneida Alarfor continued lower extremity swelling related to venous issues..   Past Medical History:  Diagnosis Date  .  Arthritis   . Asthma   . Cancer ([facility]    multiple skin cancers  . Diabetes mellitus without complication ([place]   . Diverticulosis   . Fluid retention   . GERD (gastroesophageal reflux disease)   . High cholesterol   . Hypertension   . Incomplete rotator cuff tear   . [place] ([place]   . Obesity   . Persistent atrial fibrillation ([place]   . PONV (postoperative nausea and vomiting)   . Wears glasses   . Wears partial dentures    bottom    Past Surgical History:  Procedure Laterality Date  . APPENDECTOMY    . CARDIOVERSION N/A 06/03/2019   Procedure: CARDIOVERSION;  Surgeon: KHerminio Commons MD;  Location: [facility];  Service: Cardiovascular;  Laterality: N/A;  . CARDIOVERSION N/A 07/13/2019   Procedure: CARDIOVERSION;  Surgeon: OGeralynn Rile MD;  [place]  Service: Cardiovascular;  Laterality: N/A;  . CARPAL TUNNEL RELEASE Bilateral 2001   bilateral  . CERVICAL POLYPECTOMY  04/08/2017   Procedure: POLYPECTOMY;  Surgeon: FJonnie Kind MD;  Location: [facility];  Service: Gynecology;;  Endometrial  . CHOLECYSTECTOMY    . COLONOSCOPY  2007   RRVU:YEBXIDHinternal hemorrhoids. Diminutive rectal polyp at 10 cm, cold biopsied/removed. The remainder of the rectal mucosa appeared normal Swallow left-sided diverticula. Diminutive polyp at the splenic flexure cold biopsied/removed (adenomatous)  . COLONOSCOPY N/A 12/28/2013   Procedure: COLONOSCOPY;  Surgeon: RDaneil Dolin MD;  [facility];  Service: Endoscopy;  Laterality: N/A;  730 - moved to 8:30 - Ginger notified pt  . CORONARY STENT  INTERVENTION N/A 04/27/2019   Procedure: CORONARY STENT INTERVENTION;  Surgeon: Troy Sine, MD;  Location: [place] [facility];  Service: Cardiovascular;  Laterality: N/A;  . DILATION AND CURETTAGE, DIAGNOSTIC / THERAPEUTIC  03/07/2017  . ERCP  2002  . GASTRIC BYPASS  2004  . HYSTEROSCOPY WITH D & C N/A 04/08/2017   Procedure: DILATATION AND CURETTAGE  /HYSTEROSCOPY;  Surgeon: [name], MD;  Location: [facility];  Service: Gynecology;  Laterality: N/A;  . INTRAVASCULAR ULTRASOUND/IVUS N/A 09/12/2017   Procedure: INTRAVASCULAR ULTRASOUND/IVUS;  Surgeon: Elam Dutch, MD;  Location: [place] [facility];  Service: Cardiovascular;  Laterality: N/A;  . LEFT HEART CATH AND CORONARY ANGIOGRAPHY N/A 04/27/2019   Procedure: LEFT HEART CATH AND CORONARY ANGIOGRAPHY;  Surgeon: Troy Sine, MD;  Location: [place] [facility];  Service: Cardiovascular;  Laterality: N/A;  . NECK SURGERY  1998   fusion  . PERCUTANEOUS VENOUS THROMBECTOMY,LYSIS WITH INTRAVASCULAR ULTRASOUND (IVUS) Left 09/13/2017   Procedure: REMOVAL OF LYSIS CATHETER AND INTRAVASCULAR ULTRASOUND (IVUS) LEFT ILIAC VEIN;  Surgeon: Elam Dutch, MD;  Location: [place];  Service: Vascular;  Laterality: Left;  . PERIPHERAL VASCULAR INTERVENTION  09/15/2017   Procedure: PERIPHERAL VASCULAR INTERVENTION;  Surgeon: Waynetta Sandy, MD;  Location: [place] [facility];  Service: Cardiovascular;;  VEINOUS/STENT  . PERIPHERAL VASCULAR THROMBECTOMY Left 09/12/2017   Procedure: PERIPHERAL VASCULAR THROMBECTOMY;  Surgeon: Elam Dutch, MD;  Location: [place] [facility];  Service: Cardiovascular;  Laterality: Left;  . PERIPHERAL VASCULAR THROMBECTOMY Left 09/15/2017   Procedure: PERIPHERAL VASCULAR THROMBECTOMY;  Surgeon: Waynetta Sandy, MD;  Location: [place] [facility];  Service: Cardiovascular;  Laterality: Left;  IVC TO LT FEM/POP VEIN  . SHOULDER ARTHROSCOPY WITH SUBACROMIAL DECOMPRESSION Left 06/23/2013   Procedure: LEFT SHOULDER ARTHROSCOPY WITH DEBRIDEMENT ROTATOR CUFF AND LABRUM;  Surgeon: Lorn Junes, MD;  Location: [place];  Service: Orthopedics;  Laterality: Left;  . SHOULDER SURGERY  1999   left    Current Outpatient Medications  Medication Sig Dispense Refill  . albuterol (VENTOLIN HFA) 108 (90 Base) MCG/ACT inhaler Inhale 2 puffs into the  lungs every 6 (six) hours as needed for wheezing or shortness of breath.    Marland Kitchen atorvastatin (LIPITOR) 80 MG tablet TAKE 1 TABLET (80 MG TOTAL) BY MOUTH DAILY AT 6 PM. 90 tablet 2  . BD PEN NEEDLE NANO U/F 32G X 4 MM MISC USE FOR VICTOZA INJECTIONS    . cetirizine (ZYRTEC) 10 MG tablet Take 10 mg by mouth at bedtime.     . clopidogrel (PLAVIX) 75 MG tablet Take 1 tablet (75 mg total) by mouth daily with breakfast. 90 tablet 3  . Cyanocobalamin 1500 MCG TBDP Take 1,500 mcg by mouth daily with breakfast. Vitamin B12    . dofetilide (TIKOSYN) 250 MCG capsule Take 1 capsule (250 mcg total) by mouth 2 (two) times daily. 60 capsule 6  . fluticasone (FLONASE) 50 MCG/ACT nasal spray Place 2 sprays into both nostrils daily as needed for rhinitis (congestion/ dryness).     Marland Kitchen losartan (COZAAR) 100 MG tablet Take 100 mg by mouth daily with breakfast.     . nitroGLYCERIN (NITROSTAT) 0.4 MG SL tablet Place 1 tablet (0.4 mg total) under the tongue every 5 (five) minutes as needed for chest pain. 30 tablet 0  . polyethylene glycol powder (GLYCOLAX/MIRALAX) 17 GM/SCOOP powder 1 scoop daily or as needed 255 g 11  . potassium chloride SA (KLOR-CON) 20 MEQ  tablet Take 1 tablet (20 mEq total) by mouth 2 (two) times daily.    . rivaroxaban (XARELTO) 20 MG TABS tablet Take 1 tablet (20 mg total) by mouth at bedtime. 30 tablet 2  . spironolactone (ALDACTONE) 25 MG tablet Take 25 mg by mouth daily with breakfast.     . torsemide (DEMADEX) 20 MG tablet Take 60 mg by mouth daily after breakfast.     . VICTOZA 18 MG/3ML SOPN Inject 1.8 mg into the skin at bedtime.     . nicotine (NICODERM CQ - DOSED IN MG/24 HOURS) 14 mg/24hr patch Place 1 patch (14 mg total) onto the skin daily. (Patient not taking: Reported on 11/10/2019) 14 patch 0  . nicotine (NICODERM CQ - DOSED IN MG/24 HOURS) 21 mg/24hr patch Place 1 patch (21 mg total) onto the skin daily. (Patient not taking: Reported on 11/10/2019) 42 patch 0  . nicotine (NICODERM CQ -  DOSED IN MG/24 HR) 7 mg/24hr patch Place 1 patch (7 mg total) onto the skin daily. (Patient not taking: Reported on 11/10/2019) 14 patch 0  . Nicotine 21-14-7 MG/24HR KIT Apply 1 patch topically daily.  (Patient not taking: Reported on 11/10/2019)     No current facility-administered medications for this visit.   Allergies:  Penicillins   Social History: The patient  reports that she has been smoking cigarettes. She started smoking about 49 years ago. She has a 8.70 pack-year smoking history. She has never used smokeless tobacco. She reports previous alcohol use. She reports that she does not use drugs.   Family History: The patient's family history includes Cancer in her daughter and father; Diabetes in her brother and father; Drug abuse in her sister; Heart disease in her mother; Hypertension in her brother, father, and mother; Mesothelioma in her brother; Parkinson's disease in her father.   ROS:  Please see the history of present illness. Otherwise, complete review of systems is positive for none.  All other systems are reviewed and negative.   Physical Exam: VS:  BP 118/74   Pulse (!) 54   Ht _0  (1.626 m)   Wt 292 lb 6.4 oz (132.6 kg)   SpO2 99%   BMI 50.19 kg/m , BMI Body mass index is 50.19 kg/m.  Wt Readings from Last 3 Encounters:  01/11/20 292 lb 6.4 oz (132.6 kg)  11/23/19 284 lb (128.8 kg)  11/10/19 288 lb (130.6 kg)    General: Morbidly obese patient appears comfortable at rest. Neck: Supple, no elevated JVP or carotid bruits, no thyromegaly. Lungs: Clear to auscultation, nonlabored breathing at rest. Cardiac: Regular rate and rhythm, no S3 or significant systolic murmur, no pericardial rub. Extremities: No pitting edema, distal pulses 2+. Skin: Warm and dry. Musculoskeletal: No kyphosis. Neuropsychiatric: Alert and oriented x3, affect grossly appropriate.  ECG:  EKG November 03, 2019 sinus bradycardia rate of 57.  Recent Labwork: 06/01/2019: ALT 13; AST 18; TSH  0.567 07/06/2019: Hemoglobin 12.5; Platelets 347 11/03/2019: BUN 9; Creatinine, Ser 0.66; Magnesium 2.0; Potassium 4.4; Sodium 137     Component Value Date/Time   CHOL 174 04/26/2019 0010   TRIG 110 04/26/2019 0010   HDL 41 04/26/2019 0010   CHOLHDL 4.2 04/26/2019 0010   VLDL 22 04/26/2019 0010   LDLCALC 111 (H) 04/26/2019 0010    Other Studies Reviewed Today:  Cardiac monitor 08/26/2019 Study Highlights  Sinus rhythm with frequent sinus bradycardia Paroxysmal atrial fibrillation is observed (burden 67%) with rapid ventricular rates frequently noted Occasional premature ventricular  contractions Baseline artifact limits interpretation at times.  Cardiac catheterization 04/27/2019 CORONARY STENT INTERVENTION  LEFT HEART CATH AND CORONARY ANGIOGRAPHY  Conclusion    Prox RCA lesion is 95% stenosed.  Post intervention, there is a 0% residual stenosis.  A stent was successfully placed.   Acute coronary syndrome secondary to high-grade thrombotic stenosis in the very proximal RCA in a dominant RCA vessel.  Normal left coronary circulation with a short left main, large LAD and left circumflex vessels.  Dominant RCA with 95% very proximal stenosis with thrombus burden with TIMI-3 flow.  Successful percutaneous coronary prevention with PTCA and ultimate stenting with a 3.0 x18 mm Resolute Onyx DES stent postdilated to 3.73 mm tapering to 3.68 mm with the percent stenosis being reduced to 0% and brisk TIMI-3 flow without evidence for dissection.  RECOMMENDATION: Consider initial triple drug therapy for 1 month with continuation of Plavix/Xarelto for at least 6 months in this patient on long-term anticoagulation therapy.  Diagnostic Dominance: Right  Intervention      Assessment and Plan:  1. Persistent atrial fibrillation ([place])   2. CAD in native artery   3. Essential hypertension   4. Mixed hyperlipidemia    1. Persistent atrial fibrillation ([place]) She remains  in sinus rhythm/sinus bradycardia with a rate of 57.  Had recent follow-up with atrial fibrillation clinic with Tommye Standard PA and was doing well on Tikosyn.  Continue Tikosyn 250 mcg p.o. twice daily.  Continue Xarelto 20 mg daily.  2. CAD in native artery Denies any anginal or exertional symptoms.  Continue sublingual nitroglycerin as needed.  Continue Plavix 75 mg daily.  3. Essential hypertension Blood pressure well controlled on current therapy.  Continue spironolactone 25 mg daily, torsemide 60 mg daily.  Losartan 100 mg p.o. daily.  4. Mixed hyperlipidemia Continue atorvastatin 80 mg p.o. daily.  Lipid panel 04/26/2019: TC 174, TG 110, HDL 41, LDL 111.  Medication Adjustments/Labs and Tests Ordered: Current medicines are reviewed at length with the patient today.  Concerns regarding medicines are outlined above.   Disposition: Follow-up with Dr. Harl Bowie or APP 6 months  Signed, Levell July, NP 01/11/2020 3:26 PM    [facility] [place], [place], [place] [address] Phone: [phone]; Fax: [phone]

## 2020-01-11 ENCOUNTER — Ambulatory Visit (INDEPENDENT_AMBULATORY_CARE_PROVIDER_SITE_OTHER): Payer: Medicaid Other | Admitting: Family Medicine

## 2020-01-11 ENCOUNTER — Encounter: Payer: Self-pay | Admitting: Family Medicine

## 2020-01-11 VITALS — BP 118/74 | HR 54 | Ht 64.0 in | Wt 292.4 lb

## 2020-01-11 DIAGNOSIS — I2510 Atherosclerotic heart disease of native coronary artery without angina pectoris: Secondary | ICD-10-CM | POA: Diagnosis not present

## 2020-01-11 DIAGNOSIS — E782 Mixed hyperlipidemia: Secondary | ICD-10-CM | POA: Diagnosis not present

## 2020-01-11 DIAGNOSIS — I10 Essential (primary) hypertension: Secondary | ICD-10-CM | POA: Diagnosis not present

## 2020-01-11 DIAGNOSIS — I4819 Other persistent atrial fibrillation: Secondary | ICD-10-CM

## 2020-01-11 NOTE — Patient Instructions (Signed)
Medication Instructions:  Continue all current medications.   Labwork: none  Testing/Procedures: none  Follow-Up: 6 months   Any Other Special Instructions Will Be Listed Below (If Applicable).   If you need a refill on your cardiac medications before your next appointment, please call your pharmacy.  

## 2020-02-09 NOTE — Progress Notes (Signed)
Primary Care Physician: Carylon PerchesFagan, Roy, MD Primary Cardiologist: Dr Wyline MoodBranch Primary Electrophysiologist: Dr Johney FrameAllred Referring Physician: Dr Berenice PrimasBranch   Amy Tran is a 64 y.o. female with a history of CAD, HTN, HLD, left iliac thrombosis on Xarelto, type 2 diabetes, tobacco abuse, persistent atrial fibrillation (CHA2DS2-VASc score 3), NSTEMI 04/25/2019 (DES to proximal RCA) who [facility]. The patient was initially diagnosed with atrial fibrillation in the setting of her NSTEMI. Patient is on Xarelto for a CHADS2VASC score of 3. Patient underwent DCCV on 06/03/19 but unfortunately was back in afib by her follow up on 06/22/19. Patient continues to have symptoms of fatigue despite good rate control. She denies any snoring or significant alcohol use. There were no specific triggers that the patient could identify. She denies bleeding issues on anticoagulation. She wore a Zio patch which did not show any significant bradycardia, block, or pauses. She did have a 67% afib burden however. Multaq was discontinued. Patient is s/p dofetilide loading 8/9-8/12/21. She converted with the medication and did not require DCCV.   On follow up today, patient reports that she has done well since her last visit. She denies any symptoms of afib. She denies any bleeding issues on anticoagulation.   Today, she denies symptoms of palpitations, chest pain, shortness of breath, orthopnea, PND, lower extremity edema, dizziness, presyncope, syncope, snoring, daytime somnolence, bleeding, or neurologic sequela. The patient is tolerating medications without difficulties and is otherwise without complaint today.    Atrial Fibrillation Risk Factors:  she does not have symptoms or diagnosis of sleep apnea. she does not have a history of rheumatic fever. she does not have a history of alcohol use. The patient does not have a history of early familial atrial fibrillation or  other arrhythmias.  she has a BMI of Body mass index is 50.09 kg/m.Marland Kitchen. Filed Weights   02/10/20 1022  Weight: 132.4 kg    Family History  Problem Relation Age of Onset  . Diabetes Father   . Hypertension Father   . Parkinson's disease Father   . Cancer Father        not sure what kind  . Hypertension Mother   . Heart disease Mother   . Drug abuse Sister   . Mesothelioma Brother   . Cancer Daughter        non-hodgkins lmphoma  . Hypertension Brother   . Diabetes Brother   . Colon cancer Neg Hx   . Gastric cancer Neg Hx   . Esophageal cancer Neg Hx      Atrial Fibrillation Management history:  Previous antiarrhythmic drugs: Multaq, dofetilide  Previous cardioversions: 06/03/19, 07/13/19 Previous ablations: none CHADS2VASC score: 3 Anticoagulation history: Xarelto   Past Medical History:  Diagnosis Date  . Arthritis   . Asthma   . Cancer [facility](HCC)    multiple skin cancers  . Diabetes mellitus without complication (HCC)   . Diverticulosis   . Fluid retention   . GERD (gastroesophageal reflux disease)   . High cholesterol   . Hypertension   . Incomplete rotator cuff tear   . Myocardial infarction (HCC)   . Obesity   . Persistent atrial fibrillation (HCC)   . PONV (postoperative nausea and vomiting)   . Wears glasses   . Wears partial dentures    bottom   Past Surgical History:  Procedure Laterality Date  . APPENDECTOMY    . CARDIOVERSION N/A 06/03/2019   Procedure: CARDIOVERSION;  Surgeon:  Herminio Commons, MD;  Location: [facility];  Service: Cardiovascular;  Laterality: N/A;  . CARDIOVERSION N/A 07/13/2019   Procedure: CARDIOVERSION;  Surgeon: Geralynn Rile, MD;  Location: [place];  Service: Cardiovascular;  Laterality: N/A;  . CARPAL TUNNEL RELEASE Bilateral 2001   bilateral  . CERVICAL POLYPECTOMY  04/08/2017   Procedure: POLYPECTOMY;  Surgeon: [name], MD;  Location: [facility];  Service: Gynecology;;  Endometrial  . CHOLECYSTECTOMY    .  COLONOSCOPY  2007   [id number] internal hemorrhoids. Diminutive rectal polyp at 10 cm, cold biopsied/removed. The remainder of the rectal mucosa appeared normal Swallow left-sided diverticula. Diminutive polyp at the splenic flexure cold biopsied/removed (adenomatous)  . COLONOSCOPY N/A 12/28/2013   Procedure: COLONOSCOPY;  Surgeon: Daneil Dolin, MD;  Location: [facility];  Service: Endoscopy;  Laterality: N/A;  730 - moved to 8:30 - Ginger notified pt  . CORONARY STENT INTERVENTION N/A 04/27/2019   Procedure: CORONARY STENT INTERVENTION;  Surgeon: Troy Sine, MD;  Location: [place] [facility];  Service: Cardiovascular;  Laterality: N/A;  . DILATION AND CURETTAGE, DIAGNOSTIC / THERAPEUTIC  03/07/2017  . ERCP  2002  . GASTRIC BYPASS  2004  . HYSTEROSCOPY WITH D & C N/A 04/08/2017   Procedure: DILATATION AND CURETTAGE /HYSTEROSCOPY;  Surgeon: [name], MD;  Location: [facility];  Service: Gynecology;  Laterality: N/A;  . INTRAVASCULAR ULTRASOUND/IVUS N/A 09/12/2017   Procedure: INTRAVASCULAR ULTRASOUND/IVUS;  Surgeon: Elam Dutch, MD;  Location: [place] [facility];  Service: Cardiovascular;  Laterality: N/A;  . LEFT HEART CATH AND CORONARY ANGIOGRAPHY N/A 04/27/2019   Procedure: LEFT HEART CATH AND CORONARY ANGIOGRAPHY;  Surgeon: Troy Sine, MD;  Location: [place] [facility];  Service: Cardiovascular;  Laterality: N/A;  . NECK SURGERY  1998   fusion  . PERCUTANEOUS VENOUS THROMBECTOMY,LYSIS WITH INTRAVASCULAR ULTRASOUND (IVUS) Left 09/13/2017   Procedure: REMOVAL OF LYSIS CATHETER AND INTRAVASCULAR ULTRASOUND (IVUS) LEFT ILIAC VEIN;  Surgeon: Elam Dutch, MD;  Location: [place];  Service: Vascular;  Laterality: Left;  . PERIPHERAL VASCULAR INTERVENTION  09/15/2017   Procedure: PERIPHERAL VASCULAR INTERVENTION;  Surgeon: Waynetta Sandy, MD;  Location: [place] [facility];  Service: Cardiovascular;;  VEINOUS/STENT  . PERIPHERAL VASCULAR THROMBECTOMY Left 09/12/2017    Procedure: PERIPHERAL VASCULAR THROMBECTOMY;  Surgeon: Elam Dutch, MD;  Location: [place] [facility];  Service: Cardiovascular;  Laterality: Left;  . PERIPHERAL VASCULAR THROMBECTOMY Left 09/15/2017   Procedure: PERIPHERAL VASCULAR THROMBECTOMY;  Surgeon: Waynetta Sandy, MD;  Location: [place] [facility];  Service: Cardiovascular;  Laterality: Left;  IVC TO LT FEM/POP VEIN  . SHOULDER ARTHROSCOPY WITH SUBACROMIAL DECOMPRESSION Left 06/23/2013   Procedure: LEFT SHOULDER ARTHROSCOPY WITH DEBRIDEMENT ROTATOR CUFF AND LABRUM;  Surgeon: Lorn Junes, MD;  Location: [place];  Service: Orthopedics;  Laterality: Left;  . SHOULDER SURGERY  1999   left    Current Outpatient Medications  Medication Sig Dispense Refill  . acetaminophen (ACETAMINOPHEN EXTRA STRENGTH) 500 MG tablet Take 500 mg by mouth every 6 (six) hours as needed for mild pain or headache.    . albuterol (VENTOLIN HFA) 108 (90 Base) MCG/ACT inhaler Inhale 2 puffs into the lungs every 6 (six) hours as needed for wheezing or shortness of breath.    Marland Kitchen atorvastatin (LIPITOR) 80 MG tablet TAKE 1 TABLET (80 MG TOTAL) BY MOUTH DAILY AT 6 PM. 90 tablet 2  . BD PEN NEEDLE NANO U/F 32G X  4 MM MISC USE FOR VICTOZA INJECTIONS    . cetirizine (ZYRTEC) 10 MG tablet Take 10 mg by mouth at bedtime.    . clopidogrel (PLAVIX) 75 MG tablet Take 1 tablet (75 mg total) by mouth daily with breakfast. 90 tablet 3  . Cyanocobalamin 1500 MCG TBDP Take 1,500 mcg by mouth daily with breakfast. Vitamin B12    . dofetilide (TIKOSYN) 250 MCG capsule Take 1 capsule (250 mcg total) by mouth 2 (two) times daily. 60 capsule 6  . fluticasone (FLONASE) 50 MCG/ACT nasal spray Place 2 sprays into both nostrils daily as needed for rhinitis (congestion/ dryness).     Marland Kitchen losartan (COZAAR) 100 MG tablet Take 100 mg by mouth daily with breakfast.     . nitroGLYCERIN (NITROSTAT) 0.4 MG SL tablet Place 1 tablet (0.4 mg total) under the tongue every 5  (five) minutes as needed for chest pain. 30 tablet 0  . polyethylene glycol powder (GLYCOLAX/MIRALAX) 17 GM/SCOOP powder 1 scoop daily or as needed 255 g 11  . potassium chloride SA (KLOR-CON) 20 MEQ tablet Take 1 tablet (20 mEq total) by mouth 2 (two) times daily.    . rivaroxaban (XARELTO) 20 MG TABS tablet Take 1 tablet (20 mg total) by mouth at bedtime. 30 tablet 2  . spironolactone (ALDACTONE) 25 MG tablet Take 25 mg by mouth daily with breakfast.     . torsemide (DEMADEX) 20 MG tablet Take 60 mg by mouth daily after breakfast.     . VICTOZA 18 MG/3ML SOPN Inject 1.8 mg into the skin at bedtime.      No current facility-administered medications for this encounter.    Allergies  Allergen Reactions  . Penicillins Hives and Rash    Has patient had a PCN reaction causing immediate rash, facial/tongue/throat swelling, SOB or lightheadedness with hypotension: Yes Has patient had a PCN reaction causing severe rash involving mucus membranes or skin necrosis: No Has patient had a PCN reaction that required hospitalization No Has patient had a PCN reaction occurring within the last 10 years: No If all of the above answers are "NO", then may proceed with Cephalosporin use.     Social History   Socioeconomic History  . Marital status: Widowed    Spouse name: Not on file  . Number of children: 2  . Years of education: Not on file  . Highest education level: Not on file  Occupational History  . Occupation: works at [employer]   Tobacco Use  . Smoking status: Current Every Day Smoker    Packs/day: 0.30    Years: 29.00    Pack years: 8.70    Types: Cigarettes    Start date: 04/09/1970  . Smokeless tobacco: Never Used  . Tobacco comment: 5 cigarettes-half pack a day   Vaping Use  . Vaping Use: Never used  Substance and Sexual Activity  . Alcohol use: Not Currently  . Drug use: No  . Sexual activity: Not Currently    Birth control/protection: Post-menopausal  Other  Topics Concern  . Not on file  Social History Narrative  . Not on file   Social Determinants of Health   Financial Resource Strain: Not on file  Food Insecurity: Not on file  Transportation Needs: Not on file  Physical Activity: Not on file  Stress: Not on file  Social Connections: Not on file  Intimate Partner Violence: Not on file     ROS- All systems are reviewed and negative  except as per the HPI above.  Physical Exam: Vitals:   02/10/20 1022  BP: 118/60  Pulse: (!) 51  Weight: 132.4 kg  Height: 5\' 4"  (1.626 m)    GEN- The patient is well appearing obese female, alert and oriented x 3 today.   HEENT-head normocephalic, atraumatic, sclera clear, conjunctiva pink, hearing intact, trachea midline. Lungs- Clear to ausculation bilaterally, normal work of breathing Heart- Regular rate and rhythm, bradycardia, no murmurs, rubs or gallops  GI- soft, NT, ND, + BS Extremities- no clubbing, cyanosis, or edema MS- no significant deformity or atrophy Skin- no rash or lesion Psych- euthymic mood, full affect Neuro- strength and sensation are intact   Wt Readings from Last 3 Encounters:  02/10/20 132.4 kg  01/11/20 132.6 kg  11/23/19 128.8 kg    EKG today demonstrates  SB  Vent. rate 51 BPM PR interval 190 ms QRS duration 86 ms QT/QTc 452/416 ms  Echo 04/28/19 demonstrated  1. Left ventricular ejection fraction, by estimation, is 55 to 60%. Left  ventricular ejection fraction by 3D volume is 58 %. The left ventricle has  normal function. The left ventricle has no regional wall motion  abnormalities. The left ventricular  internal cavity size was mildly dilated. There is mild left ventricular  hypertrophy. Left ventricular diastolic parameters are indeterminate.  2. Right ventricular systolic function is normal. The right ventricular  size is mildly enlarged. There is mildly elevated pulmonary artery  systolic pressure. The estimated right ventricular systolic  pressure is  99991111 mmHg.  3. The mitral valve is normal in structure. Mild mitral valve  regurgitation.  4. The aortic valve is tricuspid. Aortic valve regurgitation is not  visualized. Mild aortic stenosis. Vmax 2.8 m/s, MG 22mmHg, AVA 1.4 cm^2,  DI 0.55  5. Aortic dilatation noted. There is mild dilatation of the ascending  aorta measuring 36 mm.  6. The inferior vena cava is dilated in size with <50% respiratory  variability, suggesting right atrial pressure of 15 mmHg.   Epic records are reviewed at length today  CHA2DS2-VASc Score = 3  The patient's score is based upon: CHF History: No HTN History: Yes Diabetes History: No Stroke History: No Vascular Disease History: Yes      ASSESSMENT AND PLAN: 1. Persistent Atrial Fibrillation (ICD10:  I48.19) S/p dofetilide loading 8/9-8/12/21 Patient appears to be maintaining SR. Continue dofetilide 250 mcg BID. QT stable. Check bmet/mag today. Continue Xarelto 20 mg daily  2. Secondary Hypercoagulable State (ICD10:  D68.69) The patient is at significant risk for stroke/thromboembolism based upon her CHA2DS2-VASc Score of 3.  Continue Rivaroxaban (Xarelto).   3. Obesity Body mass index is 50.09 kg/m. Lifestyle modification was discussed and encouraged including regular physical activity and weight reduction.  4. CAD S/p PCI to RCA 04/27/19. No anginal symptoms.  5. HTN Stable, no changes today.   Follow up with Dr Rayann Heman or EP APP per recall. AF clinic in 6 months.    [facility] [address] [place], [place] [address] [phone]

## 2020-02-10 ENCOUNTER — Other Ambulatory Visit: Payer: Self-pay

## 2020-02-10 ENCOUNTER — Encounter (HOSPITAL_COMMUNITY): Payer: Self-pay | Admitting: Physician Assistant

## 2020-02-10 ENCOUNTER — Ambulatory Visit (HOSPITAL_COMMUNITY)
Admission: RE | Admit: 2020-02-10 | Discharge: 2020-02-10 | Disposition: A | Discharge status: Home / Self Care | Payer: Medicaid Other | Source: Ambulatory Visit | Attending: Physician Assistant | Admitting: Physician Assistant

## 2020-02-10 VITALS — BP 118/60 | HR 51 | Ht 64.0 in | Wt 291.8 lb

## 2020-02-10 DIAGNOSIS — Z9049 Acquired absence of other specified parts of digestive tract: Secondary | ICD-10-CM | POA: Insufficient documentation

## 2020-02-10 DIAGNOSIS — I4819 Other persistent atrial fibrillation: Secondary | ICD-10-CM

## 2020-02-10 DIAGNOSIS — Z6841 Body Mass Index (BMI) 40.0 and over, adult: Secondary | ICD-10-CM | POA: Diagnosis not present

## 2020-02-10 DIAGNOSIS — Z79899 Other long term (current) drug therapy: Secondary | ICD-10-CM | POA: Diagnosis not present

## 2020-02-10 DIAGNOSIS — Z8249 Family history of ischemic heart disease and other diseases of the circulatory system: Secondary | ICD-10-CM | POA: Insufficient documentation

## 2020-02-10 DIAGNOSIS — D6869 Other thrombophilia: Secondary | ICD-10-CM

## 2020-02-10 DIAGNOSIS — F17210 Nicotine dependence, cigarettes, uncomplicated: Secondary | ICD-10-CM | POA: Insufficient documentation

## 2020-02-10 DIAGNOSIS — E785 Hyperlipidemia, unspecified: Secondary | ICD-10-CM | POA: Diagnosis not present

## 2020-02-10 DIAGNOSIS — Z7902 Long term (current) use of antithrombotics/antiplatelets: Secondary | ICD-10-CM | POA: Diagnosis not present

## 2020-02-10 DIAGNOSIS — Z955 Presence of coronary angioplasty implant and graft: Secondary | ICD-10-CM | POA: Insufficient documentation

## 2020-02-10 DIAGNOSIS — E669 Obesity, unspecified: Secondary | ICD-10-CM | POA: Diagnosis not present

## 2020-02-10 DIAGNOSIS — I252 Old myocardial infarction: Secondary | ICD-10-CM | POA: Insufficient documentation

## 2020-02-10 DIAGNOSIS — I10 Essential (primary) hypertension: Secondary | ICD-10-CM | POA: Insufficient documentation

## 2020-02-10 DIAGNOSIS — I513 Intracardiac thrombosis, not elsewhere classified: Secondary | ICD-10-CM | POA: Diagnosis not present

## 2020-02-10 DIAGNOSIS — I2510 Atherosclerotic heart disease of native coronary artery without angina pectoris: Secondary | ICD-10-CM | POA: Insufficient documentation

## 2020-02-10 DIAGNOSIS — Z7901 Long term (current) use of anticoagulants: Secondary | ICD-10-CM | POA: Insufficient documentation

## 2020-02-10 DIAGNOSIS — Z9884 Bariatric surgery status: Secondary | ICD-10-CM | POA: Insufficient documentation

## 2020-02-10 LAB — BASIC METABOLIC PANEL
Anion gap: 9 (ref 5–15)
BUN: 14 mg/dL (ref 8–23)
CO2: 25 mmol/L (ref 22–32)
Calcium: 8.9 mg/dL (ref 8.9–10.3)
Chloride: 100 mmol/L (ref 98–111)
Creatinine, Ser: 0.81 mg/dL (ref 0.44–1.00)
GFR, Estimated: >60 mL/min (ref >60)
Glucose, Bld: 146 mg/dL — ABNORMAL HIGH (ref 70–99)
Potassium: 4 mmol/L (ref 3.5–5.1)
Sodium: 134 mmol/L — ABNORMAL LOW (ref 135–145)

## 2020-02-10 LAB — MAGNESIUM: Magnesium: 2.1 mg/dL (ref 1.7–2.4)

## 2020-02-21 ENCOUNTER — Other Ambulatory Visit: Payer: Self-pay | Admitting: Vascular Surgery

## 2020-03-31 ENCOUNTER — Other Ambulatory Visit: Payer: Self-pay

## 2020-03-31 ENCOUNTER — Encounter (HOSPITAL_COMMUNITY): Payer: Self-pay | Admitting: Emergency Medicine

## 2020-03-31 ENCOUNTER — Emergency Department (HOSPITAL_COMMUNITY)
Admission: EM | Admit: 2020-03-31 | Discharge: 2020-03-31 | Disposition: A | Discharge status: Home / Self Care | Payer: Medicare Other | Attending: Emergency Medicine | Admitting: Emergency Medicine

## 2020-03-31 DIAGNOSIS — Z85828 Personal history of other malignant neoplasm of skin: Secondary | ICD-10-CM | POA: Diagnosis not present

## 2020-03-31 DIAGNOSIS — Z7901 Long term (current) use of anticoagulants: Secondary | ICD-10-CM | POA: Insufficient documentation

## 2020-03-31 DIAGNOSIS — F17210 Nicotine dependence, cigarettes, uncomplicated: Secondary | ICD-10-CM | POA: Insufficient documentation

## 2020-03-31 DIAGNOSIS — I10 Essential (primary) hypertension: Secondary | ICD-10-CM | POA: Insufficient documentation

## 2020-03-31 DIAGNOSIS — Z79899 Other long term (current) drug therapy: Secondary | ICD-10-CM | POA: Insufficient documentation

## 2020-03-31 DIAGNOSIS — J45909 Unspecified asthma, uncomplicated: Secondary | ICD-10-CM | POA: Insufficient documentation

## 2020-03-31 DIAGNOSIS — I4819 Other persistent atrial fibrillation: Secondary | ICD-10-CM | POA: Diagnosis not present

## 2020-03-31 DIAGNOSIS — J0100 Acute maxillary sinusitis, unspecified: Secondary | ICD-10-CM | POA: Diagnosis not present

## 2020-03-31 DIAGNOSIS — E119 Type 2 diabetes mellitus without complications: Secondary | ICD-10-CM | POA: Insufficient documentation

## 2020-03-31 DIAGNOSIS — R0981 Nasal congestion: Secondary | ICD-10-CM | POA: Diagnosis present

## 2020-03-31 MED ORDER — DOXYCYCLINE HYCLATE 100 MG PO TABS
100.0000 mg | ORAL_TABLET | Freq: Once | ORAL | Status: AC
  Administered 2020-03-31: 100 mg via ORAL
  Filled 2020-03-31: qty 1

## 2020-03-31 MED ORDER — DOXYCYCLINE HYCLATE 100 MG PO CAPS
100.0000 mg | ORAL_CAPSULE | Freq: Two times a day (BID) | ORAL | 0 refills | Status: DC
Start: 2020-03-31 — End: 2020-04-25

## 2020-03-31 NOTE — ED Triage Notes (Signed)
Pt c/o of nasal congestion, headache, cough x 2 weeks

## 2020-03-31 NOTE — Discharge Instructions (Addendum)
Your exam is strongly suggestive of a sinus infection.  Take the entire course of the antibiotic prescribed, your next dose tomorrow morning.  I recommend continuing your flonase nasal spray.  In addition, vicks vapor inhaler sticks, menthol cough lozenges, strong peppermints can also help resolve the pressure and congestion.

## 2020-04-02 NOTE — ED Provider Notes (Signed)
[facility] EMERGENCY DEPARTMENT Provider Note   CSN: [id number] Arrival date & time: 03/31/20  1549     History Chief Complaint  Patient presents with  . Nasal Congestion    Amy Tran is a 65 y.o. female with history significant for asthma, allergy and history of sinus infections presenting with  Suspected sinus infection.  She reports nasal congestion with thick, sometimes bloody nasal discharge without frank epistaxis along with sinus pain consistent with prior sinus infections.  She denies fevers, sore throat, anosmia, dizziness, ear pain or tinnitus.  She has had a dry cough for the past several weeks, +pnd, denies sob, chest pain.  She has used decongestants and flonase without sx relief. No covid exposures. Reports aching along her right upper gingiva, no dental concerns, edentulous upper.   HPI     Past Medical History:  Diagnosis Date  . Arthritis   . Asthma   . Cancer [facility])    multiple skin cancers  . Diabetes mellitus without complication ([place])   . Diverticulosis   . Fluid retention   . GERD (gastroesophageal reflux disease)   . High cholesterol   . Hypertension   . Incomplete rotator cuff tear   . Myocardial infarction ([place])   . Obesity   . Persistent atrial fibrillation ([place])   . PONV (postoperative nausea and vomiting)   . Wears glasses   . Wears partial dentures    bottom    Patient Active Problem List   Diagnosis Date Noted  . Persistent atrial fibrillation ([place]) 07/06/2019  . Secondary hypercoagulable state ([place]) 07/06/2019  . Acute coronary syndrome ([place])   . Chest pain 04/26/2019  . Tobacco dependence 04/26/2019  . NSTEMI (non-ST elevated myocardial infarction) ([place]) 04/26/2019  . Iliac vein thrombosis, left ([place]) 09/12/2017  . Hypokalemia 04/02/2017  . GERD (gastroesophageal reflux disease) 10/21/2016  . Abdominal pain 10/21/2016  . Extrinsic asthma without complication 28/31/5176  . Oral thrush 07/01/2016  . Rectocele 09/19/2015  .  Diverticulosis of colon without hemorrhage   . Hx of adenomatous colonic polyps 12/01/2013  . Pain in joint, shoulder region 06/24/2013  . Muscle weakness (generalized) 06/24/2013  . Incomplete rotator cuff tear   . Diabetes mellitus without complication ([place])   . PONV (postoperative nausea and vomiting)   . Fluid retention   . Hypertension     Past Surgical History:  Procedure Laterality Date  . APPENDECTOMY    . CARDIOVERSION N/A 06/03/2019   Procedure: CARDIOVERSION;  Surgeon: Herminio Commons, MD;  Location: [facility] ORS;  Service: Cardiovascular;  Laterality: N/A;  . CARDIOVERSION N/A 07/13/2019   Procedure: CARDIOVERSION;  Surgeon: Geralynn Rile, MD;  Location: [place];  Service: Cardiovascular;  Laterality: N/A;  . CARPAL TUNNEL RELEASE Bilateral 2001   bilateral  . CERVICAL POLYPECTOMY  04/08/2017   Procedure: POLYPECTOMY;  Surgeon: [name], MD;  Location: [facility] ORS;  Service: Gynecology;;  Endometrial  . CHOLECYSTECTOMY    . COLONOSCOPY  2007   HYW:[id number] internal hemorrhoids. Diminutive rectal polyp at 10 cm, cold biopsied/removed. The remainder of the rectal mucosa appeared normal Swallow left-sided diverticula. Diminutive polyp at the splenic flexure cold biopsied/removed (adenomatous)  . COLONOSCOPY N/A 12/28/2013   Procedure: COLONOSCOPY;  Surgeon: Daneil Dolin, MD;  Location: [facility] ENDO SU[facility];  Service: Endoscopy;  Laterality: N/A;  730 - moved to 8:30 - Ginger notified pt  . CORONARY STENT INTERVENTION N/A 04/27/2019   Procedure: CORONARY STENT INTERVENTION;  Surgeon: Troy Sine, MD;  Location: [place] CV LAB;  Service: Cardiovascular;  Laterality: N/A;  . DILATION AND CURETTAGE, DIAGNOSTIC / THERAPEUTIC  03/07/2017  . ERCP  2002  . GASTRIC BYPASS  2004  . HYSTEROSCOPY WITH D & C N/A 04/08/2017   Procedure: DILATATION AND CURETTAGE /HYSTEROSCOPY;  Surgeon: [name], MD;  Location: [facility] ORS;  Service: Gynecology;  Laterality: N/A;  .  INTRAVASCULAR ULTRASOUND/IVUS N/A 09/12/2017   Procedure: INTRAVASCULAR ULTRASOUND/IVUS;  Surgeon: Elam Dutch, MD;  Location: [place] CV LAB;  Service: Cardiovascular;  Laterality: N/A;  . LEFT HEART CATH AND CORONARY ANGIOGRAPHY N/A 04/27/2019   Procedure: LEFT HEART CATH AND CORONARY ANGIOGRAPHY;  Surgeon: Troy Sine, MD;  Location: [place] CV LAB;  Service: Cardiovascular;  Laterality: N/A;  . NECK SURGERY  1998   fusion  . PERCUTANEOUS VENOUS THROMBECTOMY,LYSIS WITH INTRAVASCULAR ULTRASOUND (IVUS) Left 09/13/2017   Procedure: REMOVAL OF LYSIS CATHETER AND INTRAVASCULAR ULTRASOUND (IVUS) LEFT ILIAC VEIN;  Surgeon: Elam Dutch, MD;  Location: [place];  Service: Vascular;  Laterality: Left;  . PERIPHERAL VASCULAR INTERVENTION  09/15/2017   Procedure: PERIPHERAL VASCULAR INTERVENTION;  Surgeon: Waynetta Sandy, MD;  Location: [place] CV LAB;  Service: Cardiovascular;;  VEINOUS/STENT  . PERIPHERAL VASCULAR THROMBECTOMY Left 09/12/2017   Procedure: PERIPHERAL VASCULAR THROMBECTOMY;  Surgeon: Elam Dutch, MD;  Location: [place] CV LAB;  Service: Cardiovascular;  Laterality: Left;  . PERIPHERAL VASCULAR THROMBECTOMY Left 09/15/2017   Procedure: PERIPHERAL VASCULAR THROMBECTOMY;  Surgeon: Waynetta Sandy, MD;  Location: [place] CV LAB;  Service: Cardiovascular;  Laterality: Left;  IVC TO LT FEM/POP VEIN  . SHOULDER ARTHROSCOPY WITH SUBACROMIAL DECOMPRESSION Left 06/23/2013   Procedure: LEFT SHOULDER ARTHROSCOPY WITH DEBRIDEMENT ROTATOR CUFF AND LABRUM;  Surgeon: Lorn Junes, MD;  Location: [place];  Service: Orthopedics;  Laterality: Left;  . SHOULDER SURGERY  1999   left     OB History    Gravida  2   Para  2   Term  2   Preterm      AB      Living  2     SAB      IAB      Ectopic      Multiple      Live Births  2           Family History  Problem Relation Age of Onset  . Diabetes Father   .  Hypertension Father   . Parkinson's disease Father   . Cancer Father        not sure what kind  . Hypertension Mother   . Heart disease Mother   . Drug abuse Sister   . Mesothelioma Brother   . Cancer Daughter        non-hodgkins lmphoma  . Hypertension Brother   . Diabetes Brother   . Colon cancer Neg Hx   . Gastric cancer Neg Hx   . Esophageal cancer Neg Hx     Social History   Tobacco Use  . Smoking status: Current Every Day Smoker    Packs/day: 0.30    Years: 29.00    Pack years: 8.70    Types: Cigarettes    Start date: 04/09/1970  . Smokeless tobacco: Never Used  . Tobacco comment: 5 cigarettes-half pack a day   Vaping Use  . Vaping Use: Never used  Substance Use Topics  . Alcohol use: Not Currently  . Drug use: No  Home Medications Prior to Admission medications   Medication Sig Start Date End Date Taking? Authorizing Provider  doxycycline (VIBRAMYCIN) 100 MG capsule Take 1 capsule (100 mg total) by mouth 2 (two) times daily. 03/31/20  Yes Jeffry Vogelsang, Almyra Free, PA-C  acetaminophen (ACETAMINOPHEN EXTRA STRENGTH) 500 MG tablet Take 500 mg by mouth every 6 (six) hours as needed for mild pain or headache.    [provider]  albuterol (VENTOLIN HFA) 108 (90 Base) MCG/ACT inhaler Inhale 2 puffs into the lungs every 6 (six) hours as needed for wheezing or shortness of breath.    [provider]  atorvastatin (LIPITOR) 80 MG tablet TAKE 1 TABLET (80 MG TOTAL) BY MOUTH DAILY AT 6 PM. 11/29/19   Branch, Alphonse Guild, MD  BD PEN NEEDLE NANO U/F 32G X 4 MM MISC USE FOR [place] INJECTIONS 08/03/18   [provider]  cetirizine (ZYRTEC) 10 MG tablet Take 10 mg by mouth at bedtime.    [provider]  clopidogrel (PLAVIX) 75 MG tablet Take 1 tablet (75 mg total) by mouth daily with breakfast. 05/24/19   Imogene Burn, PA-C  Cyanocobalamin 1500 MCG TBDP Take 1,500 mcg by mouth daily with breakfast. Vitamin B12    [provider]  dofetilide  (TIKOSYN) 250 MCG capsule Take 1 capsule (250 mcg total) by mouth 2 (two) times daily. 10/01/19   Fenton, Clint R, PA  fluticasone (FLONASE) 50 MCG/ACT nasal spray Place 2 sprays into both nostrils daily as needed for rhinitis (congestion/ dryness).  06/29/18   [provider]  losartan (COZAAR) 100 MG tablet Take 100 mg by mouth daily with breakfast.     [provider]  nitroGLYCERIN (NITROSTAT) 0.4 MG SL tablet Place 1 tablet (0.4 mg total) under the tongue every 5 (five) minutes as needed for chest pain. 04/28/19   Florencia Reasons, MD  polyethylene glycol powder [facility]) 17 GM/SCOOP powder 1 scoop daily or as needed 11/23/19   Florian Buff, MD  potassium chloride SA (KLOR-CON) 20 MEQ tablet Take 1 tablet (20 mEq total) by mouth 2 (two) times daily. 09/23/19   Shirley Friar, PA-C  spironolactone (ALDACTONE) 25 MG tablet Take 25 mg by mouth daily with breakfast.     [provider]  torsemide (DEMADEX) 20 MG tablet Take 60 mg by mouth daily after breakfast.     [provider]  VICTOZA 18 MG/3ML SOPN Inject 1.8 mg into the skin at bedtime.  08/04/18   [provider]  XARELTO 20 MG TABS tablet TAKE 1 TABLET BY MOUTH EVERYDAY AT BEDTIME 02/21/20   Waynetta Sandy, MD    Allergies    Penicillins  Review of Systems   Review of Systems  Constitutional: Negative for chills and fever.  HENT: Positive for congestion, nosebleeds, postnasal drip, rhinorrhea, sinus pressure and sinus pain. Negative for ear pain, sore throat, trouble swallowing and voice change.   Eyes: Negative for discharge.  Respiratory: Positive for cough. Negative for shortness of breath, wheezing and stridor.   Cardiovascular: Negative for chest pain.  Gastrointestinal: Negative for abdominal pain.  Genitourinary: Negative.     Physical Exam Updated Vital Signs BP 132/73   Pulse 62   Temp 97.9 F (36.6 C) (Oral)   Resp 18   Ht 5\' 4"  (1.626 m)   Wt 125.2 kg    SpO2 98%   BMI 47.38 kg/m   Physical Exam Constitutional:      Appearance: She is well-developed and well-nourished.  HENT:     Head: Normocephalic and atraumatic.     Right Ear: Tympanic membrane and ear canal normal.     Left Ear: Tympanic membrane and ear canal normal.     Nose: Mucosal edema and congestion present. No rhinorrhea.     Right Sinus: Maxillary sinus tenderness present.     Mouth/Throat:     Mouth: Oropharynx is clear and moist and mucous membranes are normal. Mucous membranes are moist.     Pharynx: Uvula midline. No oropharyngeal exudate, posterior oropharyngeal edema or posterior oropharyngeal erythema.     Tonsils: No tonsillar abscesses.  Eyes:     Conjunctiva/sclera: Conjunctivae normal.  Cardiovascular:     Rate and Rhythm: Normal rate.     Heart sounds: Normal heart sounds.  Pulmonary:     Effort: Pulmonary effort is normal. No respiratory distress.     Breath sounds: No wheezing or rales.  Musculoskeletal:        General: Normal range of motion.  Lymphadenopathy:     Cervical: No cervical adenopathy.  Skin:    General: Skin is warm and dry.     Findings: No rash.  Neurological:     Mental Status: She is alert and oriented to person, place, and time.  Psychiatric:        Mood and Affect: Mood and affect normal.     ED Results / Procedures / Treatments   Labs (all labs ordered are listed, but only abnormal results are displayed) Labs Reviewed - No data to display  EKG None  Radiology No results found.  Procedures Procedures   Medications Ordered in ED Medications  doxycycline (VIBRA-TABS) tablet 100 mg (100 mg Oral Given 03/31/20 1951)    ED Course  I have reviewed the triage vital signs and the nursing notes.  Pertinent labs & imaging results that were available during my care of the patient were reviewed by me and considered in my medical decision making (see chart for details).    MDM Rules/Calculators/A&P                           Exam and hx c/w acute maxillary sinusitis.  PCN allergic, started on doxycycline.  Also discussed other home tx for congestion relief.  Advised prn f/u with pcp for any persistent or worsened sx.  Final Clinical Impression(s) / ED Diagnoses Final diagnoses:  Acute maxillary sinusitis, recurrence not specified    Rx / DC Orders ED Discharge Orders         Ordered    doxycycline (VIBRAMYCIN) 100 MG capsule  2 times daily        03/31/20 2005           Evalee Jefferson, Hershal Coria 04/02/20 1129    Fredia Sorrow, MD 04/19/20 1623

## 2020-04-11 ENCOUNTER — Other Ambulatory Visit: Payer: Self-pay

## 2020-04-11 ENCOUNTER — Ambulatory Visit (HOSPITAL_COMMUNITY)
Admission: RE | Admit: 2020-04-11 | Discharge: 2020-04-11 | Disposition: A | Discharge status: Home / Self Care | Payer: Medicare Other | Source: Ambulatory Visit | Attending: Vascular Surgery | Admitting: Vascular Surgery

## 2020-04-11 ENCOUNTER — Ambulatory Visit (INDEPENDENT_AMBULATORY_CARE_PROVIDER_SITE_OTHER): Payer: Medicare Other | Admitting: Physician Assistant

## 2020-04-11 VITALS — BP 145/69 | HR 58 | Temp 98.3°F | Resp 20 | Ht 64.0 in | Wt 296.5 lb

## 2020-04-11 DIAGNOSIS — I871 Compression of vein: Secondary | ICD-10-CM | POA: Diagnosis not present

## 2020-04-11 DIAGNOSIS — I87009 Postthrombotic syndrome without complications of unspecified extremity: Secondary | ICD-10-CM | POA: Diagnosis not present

## 2020-04-11 DIAGNOSIS — I739 Peripheral vascular disease, unspecified: Secondary | ICD-10-CM

## 2020-04-11 NOTE — Progress Notes (Signed)
History of Present Illness:  Patient is a 65 y.o. year old female who presents for evaluation of PAD.   She denise rest pain, non healing wounds or symptoms of claudication.    She also underwent angioplasty of her left common femoral vein with left common and external iliac venous stenting August 2019.  She is currently on Xarelto.   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    Past Medical History:  Diagnosis Date  . Arthritis   . Asthma   . Cancer [facility])    multiple skin cancers  . Diabetes mellitus without complication ([place])   . Diverticulosis   . Fluid retention   . GERD (gastroesophageal reflux disease)   . High cholesterol   . Hypertension   . Incomplete rotator cuff tear   . Myocardial infarction ([place])   . Obesity   . Persistent atrial fibrillation ([place])   . PONV (postoperative nausea and vomiting)   . Wears glasses   . Wears partial dentures    bottom    Past Surgical History:  Procedure Laterality Date  . APPENDECTOMY    . CARDIOVERSION N/A 06/03/2019   Procedure: CARDIOVERSION;  Surgeon: Herminio Commons, MD;  Location: [facility];  Service: Cardiovascular;  Laterality: N/A;  . CARDIOVERSION N/A 07/13/2019   Procedure: CARDIOVERSION;  Surgeon: Geralynn Rile, MD;  Location: [place];  Service: Cardiovascular;  Laterality: N/A;  . CARPAL TUNNEL RELEASE Bilateral 2001   bilateral  . CERVICAL POLYPECTOMY  04/08/2017   Procedure: POLYPECTOMY;  Surgeon: [name], MD;  Location: [facility];  Service: Gynecology;;  Endometrial  . CHOLECYSTECTOMY    . COLONOSCOPY  2007   QVZ:DGLOVFI internal hemorrhoids. Diminutive rectal polyp at 10 cm, cold biopsied/removed. The remainder of the rectal mucosa appeared normal Swallow left-sided diverticula. Diminutive polyp at the splenic flexure cold biopsied/removed (adenomatous)  . COLONOSCOPY N/A 12/28/2013   Procedure: COLONOSCOPY;  Surgeon: Daneil Dolin, MD;  Location: [facility];   Service: Endoscopy;  Laterality: N/A;  730 - moved to 8:30 - Ginger notified pt  . CORONARY STENT INTERVENTION N/A 04/27/2019   Procedure: CORONARY STENT INTERVENTION;  Surgeon: Troy Sine, MD;  Location: [place] CV LAB;  Service: Cardiovascular;  Laterality: N/A;  . DILATION AND CURETTAGE, DIAGNOSTIC / THERAPEUTIC  03/07/2017  . ERCP  2002  . GASTRIC BYPASS  2004  . HYSTEROSCOPY WITH D & C N/A 04/08/2017   Procedure: DILATATION AND CURETTAGE /HYSTEROSCOPY;  Surgeon: [name], MD;  Location: [facility];  Service: Gynecology;  Laterality: N/A;  . INTRAVASCULAR ULTRASOUND/IVUS N/A 09/12/2017   Procedure: INTRAVASCULAR ULTRASOUND/IVUS;  Surgeon: Elam Dutch, MD;  Location: [place] CV LAB;  Service: Cardiovascular;  Laterality: N/A;  . LEFT HEART CATH AND CORONARY ANGIOGRAPHY N/A 04/27/2019   Procedure: LEFT HEART CATH AND CORONARY ANGIOGRAPHY;  Surgeon: Troy Sine, MD;  Location: [place] CV LAB;  Service: Cardiovascular;  Laterality: N/A;  . NECK SURGERY  1998   fusion  . PERCUTANEOUS VENOUS THROMBECTOMY,LYSIS WITH INTRAVASCULAR ULTRASOUND (IVUS) Left 09/13/2017   Procedure: REMOVAL OF LYSIS CATHETER AND INTRAVASCULAR ULTRASOUND (IVUS) LEFT ILIAC VEIN;  Surgeon: Elam Dutch, MD;  Location: [place];  Service: Vascular;  Laterality: Left;  . PERIPHERAL VASCULAR INTERVENTION  09/15/2017   Procedure: PERIPHERAL VASCULAR INTERVENTION;  Surgeon: Waynetta Sandy, MD;  Location: [place] CV LAB;  Service: Cardiovascular;;  VEINOUS/STENT  . PERIPHERAL VASCULAR THROMBECTOMY Left 09/12/2017  Procedure: PERIPHERAL VASCULAR THROMBECTOMY;  Surgeon: Elam Dutch, MD;  Location: [place] CV LAB;  Service: Cardiovascular;  Laterality: Left;  . PERIPHERAL VASCULAR THROMBECTOMY Left 09/15/2017   Procedure: PERIPHERAL VASCULAR THROMBECTOMY;  Surgeon: Waynetta Sandy, MD;  Location: [place] CV LAB;  Service: Cardiovascular;  Laterality: Left;  IVC TO LT FEM/POP  VEIN  . SHOULDER ARTHROSCOPY WITH SUBACROMIAL DECOMPRESSION Left 06/23/2013   Procedure: LEFT SHOULDER ARTHROSCOPY WITH DEBRIDEMENT ROTATOR CUFF AND LABRUM;  Surgeon: Lorn Junes, MD;  Location: [place];  Service: Orthopedics;  Laterality: Left;  . SHOULDER SURGERY  1999   left    ROS:   General:  over weight l, Fever, chills  HEENT: No recent headaches, no nasal bleeding, no visual changes, no sore throat  Neurologic: No dizziness, blackouts, seizures. No recent symptoms of stroke or mini- stroke. No recent episodes of slurred speech, or temporary blindness.  Cardiac: No recent episodes of chest pain/pressure, no shortness of breath at rest.  No shortness of breath with exertion.  Denies history of atrial fibrillation or irregular heartbeat  Vascular: No history of rest pain in feet.  No history of claudication.  No history of non-healing ulcer, positive history of DVT   Pulmonary: No home oxygen, no productive cough, no hemoptysis,  No asthma or wheezing  Musculoskeletal:  [ ]  Arthritis, [ ]  Low back pain,  [x ] Joint pain  Hematologic:No history of hypercoagulable state.  No history of easy bleeding.  No history of anemia  Gastrointestinal: No hematochezia or melena,  No gastroesophageal reflux, no trouble swallowing  Urinary: [ ]  chronic Kidney disease, [ ]  on HD - [ ]  MWF or [ ]  TTHS, [ ]  Burning with urination, [ ]  Frequent urination, [ ]  Difficulty urinating;   Skin: No rashes  Psychological: No history of anxiety,  No history of depression  Social History Social History   Tobacco Use  . Smoking status: Current Every Day Smoker    Packs/day: 0.30    Years: 29.00    Pack years: 8.70    Types: Cigarettes    Start date: 04/09/1970  . Smokeless tobacco: Never Used  . Tobacco comment: 5 cigarettes-half pack a day   Vaping Use  . Vaping Use: Never used  Substance Use Topics  . Alcohol use: Not Currently  . Drug use: No    Family History Family  History  Problem Relation Age of Onset  . Diabetes Father   . Hypertension Father   . Parkinson's disease Father   . Cancer Father        not sure what kind  . Hypertension Mother   . Heart disease Mother   . Drug abuse Sister   . Mesothelioma Brother   . Cancer Daughter        non-hodgkins lmphoma  . Hypertension Brother   . Diabetes Brother   . Colon cancer Neg Hx   . Gastric cancer Neg Hx   . Esophageal cancer Neg Hx     Allergies  Allergies  Allergen Reactions  . Penicillins Hives and Rash    Has patient had a PCN reaction causing immediate rash, facial/tongue/throat swelling, SOB or lightheadedness with hypotension: Yes Has patient had a PCN reaction causing severe rash involving mucus membranes or skin necrosis: No Has patient had a PCN reaction that required hospitalization No Has patient had a PCN reaction occurring within the last 10 years: No If all of the above answers are "NO", then  may proceed with Cephalosporin use.      Current Outpatient Medications  Medication Sig Dispense Refill  . acetaminophen (TYLENOL) 500 MG tablet Take 500 mg by mouth every 6 (six) hours as needed for mild pain or headache.    . albuterol (VENTOLIN HFA) 108 (90 Base) MCG/ACT inhaler Inhale 2 puffs into the lungs every 6 (six) hours as needed for wheezing or shortness of breath.    Marland Kitchen atorvastatin (LIPITOR) 80 MG tablet TAKE 1 TABLET (80 MG TOTAL) BY MOUTH DAILY AT 6 PM. 90 tablet 2  . BD PEN NEEDLE NANO U/F 32G X 4 MM MISC USE FOR [place] INJECTIONS    . cetirizine (ZYRTEC) 10 MG tablet Take 10 mg by mouth at bedtime.    . clopidogrel (PLAVIX) 75 MG tablet Take 1 tablet (75 mg total) by mouth daily with breakfast. 90 tablet 3  . Cyanocobalamin 1500 MCG TBDP Take 1,500 mcg by mouth daily with breakfast. Vitamin B12    . dofetilide (TIKOSYN) 250 MCG capsule Take 1 capsule (250 mcg total) by mouth 2 (two) times daily. 60 capsule 6  . doxycycline (VIBRAMYCIN) 100 MG capsule Take 1 capsule  (100 mg total) by mouth 2 (two) times daily. 20 capsule 0  . fluticasone (FLONASE) 50 MCG/ACT nasal spray Place 2 sprays into both nostrils daily as needed for rhinitis (congestion/ dryness).     Marland Kitchen losartan (COZAAR) 100 MG tablet Take 100 mg by mouth daily with breakfast.     . nitroGLYCERIN (NITROSTAT) 0.4 MG SL tablet Place 1 tablet (0.4 mg total) under the tongue every 5 (five) minutes as needed for chest pain. 30 tablet 0  . polyethylene glycol powder (GLYCOLAX/MIRALAX) 17 GM/SCOOP powder 1 scoop daily or as needed 255 g 11  . potassium chloride SA (KLOR-CON) 20 MEQ tablet Take 1 tablet (20 mEq total) by mouth 2 (two) times daily.    Marland Kitchen spironolactone (ALDACTONE) 25 MG tablet Take 25 mg by mouth daily with breakfast.     . torsemide (DEMADEX) 20 MG tablet Take 60 mg by mouth daily after breakfast.     . VICTOZA 18 MG/3ML SOPN Inject 1.8 mg into the skin at bedtime.     Alveda Reasons 20 MG TABS tablet TAKE 1 TABLET BY MOUTH EVERYDAY AT BEDTIME 30 tablet 2   No current facility-administered medications for this visit.    Physical Examination  Vitals:   04/11/20 1119  BP: (!) 145/69  Pulse: (!) 58  Resp: 20  Temp: 98.3 F (36.8 C)  TempSrc: Temporal  SpO2: 97%  Weight: 296 lb 8 oz (134.5 kg)  Height: 5\' 4"  (1.626 m)    Body mass index is 50.89 kg/m.  General:  Alert and oriented, no acute distress HEENT: Normal Neck: No bruit or JVD Pulmonary: Inspiratory wheezing sounds Cardiac: Regular Rate and Rhythm without murmur Abdomen: Soft, non-tender, non-distended, no mass, no scars Skin: No rash Extremity Pulses:  2+ radial, brachial,  right  DP palpable, non palpable left pedal pulses Musculoskeletal: No deformity or edema  Neurologic: Upper and lower extremity motor intact and symmetric  DATA:  ABI Findings:  +---------+------------------+-----+---------+--------+  Right  Rt Pressure (mmHg)IndexWaveform Comment   +---------+------------------+-----+---------+--------+   Brachial 154                      +---------+------------------+-----+---------+--------+  ATA   153        0.99 triphasic      +---------+------------------+-----+---------+--------+  PTA   139  0.90 triphasic      +---------+------------------+-----+---------+--------+  Great Toe38        0.25 Abnormal       +---------+------------------+-----+---------+--------+   +---------+------------------+-----+-----------+-------+  Left   Lt Pressure (mmHg)IndexWaveform  Comment  +---------+------------------+-----+-----------+-------+  Brachial 132                       +---------+------------------+-----+-----------+-------+  ATA               not audible      +---------+------------------+-----+-----------+-------+  PTA   79        0.51 monophasic       +---------+------------------+-----+-----------+-------+  Great Toe20        0.13 Abnormal        +---------+------------------+-----+-----------+-------+   +-------+-----------+-----------+------------+------------+  ABI/TBIToday's ABIToday's TBIPrevious ABIPrevious TBI  +-------+-----------+-----------+------------+------------+  Right 0.99    0.25    1.01    0.99      +-------+-----------+-----------+------------+------------+  Left  0.51    0.13    0.53    0        +-------+-----------+-----------+------------+------------+     Bilateral ABIs appear essentially unchanged.    Summary:  Right: Resting right ankle-brachial index is within normal range. No  evidence of significant right lower extremity arterial disease. The right  toe-brachial index is abnormal.   Left: Resting left ankle-brachial index indicates moderate left lower  extremity arterial disease. The left toe-brachial index is abnormal.    ASSESSMENT:  PAD with left LE monophasic flow and abnormal TBI.  She remains asymptomatic. Left LE post phlebitic syndrome with left common and external iliac venous stenting August 2019. There are no wounds, no symptoms of rest pain or venous/arterial wounds  PLAN: She will continue to stay as active as possible, elevation of her LE when possible.  She will f/u in 6 months for repeat ABI and iliac/IVC duplex.  If she develops new symptoms she will call sooner.   Roxy Horseman PA-C [facility] Office: [phone]  MD in clinic [place]

## 2020-04-13 ENCOUNTER — Other Ambulatory Visit: Payer: Self-pay

## 2020-04-13 DIAGNOSIS — I871 Compression of vein: Secondary | ICD-10-CM

## 2020-04-13 DIAGNOSIS — I739 Peripheral vascular disease, unspecified: Secondary | ICD-10-CM

## 2020-04-24 NOTE — Progress Notes (Addendum)
Cardiology Office Note  Date: 04/25/2020   ID: Amy Tran, DOB [birth year], MRN [id number]  PCP:  Asencion Noble, MD  Cardiologist:  Carlyle Dolly, MD Electrophysiologist:  None   Chief Complaint: Follow-up fatigue possible atrial fibrillation  History of Present Illness: Amy Tran is a 65 y.o. female with a history of atrial fibrillation, DM 2, CAD, HTN, HLD, GERD, obesity, left iliac thrombus.  Saw Mr. Fenton PA at atrial fibrillation clinic on 02/10/2020 she was doing well status post conversion to normal sinus rhythm after dofetilide.  She denied any palpitations, chest pain, shortness of breath.  She was tolerating her medications without difficulty and without complaint.  She was maintaining sinus rhythm.  Continuing dofetilide 250 mcg p.o. twice daily.  QT interval stable.  Continuing Xarelto 20 mg daily.  CHA2DS2-VASc score of 3.  Blood pressure was stable.  No anginal symptoms.  She saw [facility] 04/11/2020. Plan was to stay active and follow up in 6 months for PAD with repeat ABI and iliac/IVC duplex.  She is here today with chief complaint of significant fatigue.  States the fatigue began approximately 1 month ago.  States she has no energy with headache and some occasional lightheadedness.  States she is sleeping all the time.  She recently had a sinus infection and was treated with doxycycline.  Still appears to be having some upper airway issues..  She describes continuing nasal congestion with postnasal drip.  She states that her symptoms in someway resemble the symptoms she had prior to her previous MI.  She denies any recent bleeding issues.  States her blood sugars have been running in the 120s consistently recently.  EKG today shows she is continuing in sinus rhythm on dofetilide therapy.  EKG shows normal sinus rhythm with a rate of 62.  QT/QTc 436/442.  Denies any sleep apnea-like symptoms.  States she has had a sleep study in the past.  She  states the provider told her she had a "fear of sleeping in her home".   Past Medical History:  Diagnosis Date  . Arthritis   . Asthma   . Cancer [facility])    multiple skin cancers  . Diabetes mellitus without complication ([place])   . Diverticulosis   . Fluid retention   . GERD (gastroesophageal reflux disease)   . High cholesterol   . Hypertension   . Incomplete rotator cuff tear   . Myocardial infarction ([place])   . Obesity   . Persistent atrial fibrillation ([place])   . PONV (postoperative nausea and vomiting)   . Wears glasses   . Wears partial dentures    bottom    Past Surgical History:  Procedure Laterality Date  . APPENDECTOMY    . CARDIOVERSION N/A 06/03/2019   Procedure: CARDIOVERSION;  Surgeon: Herminio Commons, MD;  Location: [facility];  Service: Cardiovascular;  Laterality: N/A;  . CARDIOVERSION N/A 07/13/2019   Procedure: CARDIOVERSION;  Surgeon: Geralynn Rile, MD;  Location: [place];  Service: Cardiovascular;  Laterality: N/A;  . CARPAL TUNNEL RELEASE Bilateral 2001   bilateral  . CERVICAL POLYPECTOMY  04/08/2017   Procedure: POLYPECTOMY;  Surgeon: [name], MD;  Location: [facility];  Service: Gynecology;;  Endometrial  . CHOLECYSTECTOMY    . COLONOSCOPY  2007   SHF:WYOVZCH internal hemorrhoids. Diminutive rectal polyp at 10 cm, cold biopsied/removed. The remainder of the rectal mucosa appeared normal Swallow left-sided diverticula. Diminutive polyp at the splenic flexure cold biopsied/removed (adenomatous)  .  COLONOSCOPY N/A 12/28/2013   Procedure: COLONOSCOPY;  Surgeon: Daneil Dolin, MD;  [facility];  Service: Endoscopy;  Laterality: N/A;  730 - moved to 8:30 - Ginger notified pt  . CORONARY STENT INTERVENTION N/A 04/27/2019   Procedure: CORONARY STENT INTERVENTION;  Surgeon: Troy Sine, MD;  Location: [place] CV LAB;  Service: Cardiovascular;  Laterality: N/A;  . DILATION AND CURETTAGE, DIAGNOSTIC / THERAPEUTIC  03/07/2017  .  ERCP  2002  . GASTRIC BYPASS  2004  . HYSTEROSCOPY WITH D & C N/A 04/08/2017   Procedure: DILATATION AND CURETTAGE /HYSTEROSCOPY;  Surgeon: [name], MD;  Location: [facility];  Service: Gynecology;  Laterality: N/A;  . INTRAVASCULAR ULTRASOUND/IVUS N/A 09/12/2017   Procedure: INTRAVASCULAR ULTRASOUND/IVUS;  Surgeon: Elam Dutch, MD;  Location: [place] CV LAB;  Service: Cardiovascular;  Laterality: N/A;  . LEFT HEART CATH AND CORONARY ANGIOGRAPHY N/A 04/27/2019   Procedure: LEFT HEART CATH AND CORONARY ANGIOGRAPHY;  Surgeon: Troy Sine, MD;  Location: [place] CV LAB;  Service: Cardiovascular;  Laterality: N/A;  . NECK SURGERY  1998   fusion  . PERCUTANEOUS VENOUS THROMBECTOMY,LYSIS WITH INTRAVASCULAR ULTRASOUND (IVUS) Left 09/13/2017   Procedure: REMOVAL OF LYSIS CATHETER AND INTRAVASCULAR ULTRASOUND (IVUS) LEFT ILIAC VEIN;  Surgeon: Elam Dutch, MD;  Location: [place];  Service: Vascular;  Laterality: Left;  . PERIPHERAL VASCULAR INTERVENTION  09/15/2017   Procedure: PERIPHERAL VASCULAR INTERVENTION;  Surgeon: Waynetta Sandy, MD;  Location: [place] CV LAB;  Service: Cardiovascular;;  VEINOUS/STENT  . PERIPHERAL VASCULAR THROMBECTOMY Left 09/12/2017   Procedure: PERIPHERAL VASCULAR THROMBECTOMY;  Surgeon: Elam Dutch, MD;  Location: [place] CV LAB;  Service: Cardiovascular;  Laterality: Left;  . PERIPHERAL VASCULAR THROMBECTOMY Left 09/15/2017   Procedure: PERIPHERAL VASCULAR THROMBECTOMY;  Surgeon: Waynetta Sandy, MD;  Location: [place] CV LAB;  Service: Cardiovascular;  Laterality: Left;  IVC TO LT FEM/POP VEIN  . SHOULDER ARTHROSCOPY WITH SUBACROMIAL DECOMPRESSION Left 06/23/2013   Procedure: LEFT SHOULDER ARTHROSCOPY WITH DEBRIDEMENT ROTATOR CUFF AND LABRUM;  Surgeon: Lorn Junes, MD;  Location: [place];  Service: Orthopedics;  Laterality: Left;  . SHOULDER SURGERY  1999   left    Current Outpatient Medications   Medication Sig Dispense Refill  . acetaminophen (TYLENOL) 500 MG tablet Take 500 mg by mouth every 6 (six) hours as needed for mild pain or headache.    . albuterol (VENTOLIN HFA) 108 (90 Base) MCG/ACT inhaler Inhale 2 puffs into the lungs every 6 (six) hours as needed for wheezing or shortness of breath.    Marland Kitchen atorvastatin (LIPITOR) 80 MG tablet TAKE 1 TABLET (80 MG TOTAL) BY MOUTH DAILY AT 6 PM. 90 tablet 2  . BD PEN NEEDLE NANO U/F 32G X 4 MM MISC USE FOR VICTOZA INJECTIONS    . cetirizine (ZYRTEC) 10 MG tablet Take 10 mg by mouth at bedtime.    . clopidogrel (PLAVIX) 75 MG tablet Take 1 tablet (75 mg total) by mouth daily with breakfast. 90 tablet 3  . Cyanocobalamin 1500 MCG TBDP Take 1,500 mcg by mouth daily with breakfast. Vitamin B12    . dofetilide (TIKOSYN) 250 MCG capsule Take 1 capsule (250 mcg total) by mouth 2 (two) times daily. 60 capsule 6  . fluticasone (FLONASE) 50 MCG/ACT nasal spray Place 2 sprays into both nostrils daily as needed for rhinitis (congestion/ dryness).     Marland Kitchen losartan (COZAAR) 100 MG tablet Take 100 mg by  mouth daily with breakfast.     . nitroGLYCERIN (NITROSTAT) 0.4 MG SL tablet Place 1 tablet (0.4 mg total) under the tongue every 5 (five) minutes as needed for chest pain. 30 tablet 0  . potassium chloride SA (KLOR-CON) 20 MEQ tablet Take 1 tablet (20 mEq total) by mouth 2 (two) times daily.    Marland Kitchen spironolactone (ALDACTONE) 25 MG tablet Take 25 mg by mouth daily with breakfast.     . torsemide (DEMADEX) 20 MG tablet Take 60 mg by mouth daily after breakfast.     . VICTOZA 18 MG/3ML SOPN Inject 1.8 mg into the skin at bedtime.     Alveda Reasons 20 MG TABS tablet TAKE 1 TABLET BY MOUTH EVERYDAY AT BEDTIME 30 tablet 2   No current facility-administered medications for this visit.   Allergies:  Penicillins   Social History: The patient  reports that she has been smoking cigarettes. She started smoking about 50 years ago. She has a 8.70 pack-year smoking history. She  has never used smokeless tobacco. She reports previous alcohol use. She reports that she does not use drugs.   Family History: The patient's family history includes Cancer in her daughter and father; Diabetes in her brother and father; Drug abuse in her sister; Heart disease in her mother; Hypertension in her brother, father, and mother; Mesothelioma in her brother; Parkinson's disease in her father.   ROS:  Please see the history of present illness. Otherwise, complete review of systems is positive for none.  All other systems are reviewed and negative.   Physical Exam: VS:  BP (!) 102/52   Pulse 62   Ht 5\' 4"  (1.626 m)   Wt 298 lb (135.2 kg)   SpO2 93%   BMI 51.15 kg/m , BMI Body mass index is 51.15 kg/m.  Wt Readings from Last 3 Encounters:  04/25/20 298 lb (135.2 kg)  04/11/20 296 lb 8 oz (134.5 kg)  03/31/20 276 lb (125.2 kg)    General: Morbidly obese patient appears comfortable at rest. Neck: Supple, no elevated JVP or carotid bruits, no thyromegaly. Lungs: Clear to auscultation, nonlabored breathing at rest. Cardiac: Regular rate and rhythm, no S3 or significant systolic murmur, no pericardial rub. Extremities: No pitting edema, distal pulses 2+. Skin: Warm and dry. Musculoskeletal: No kyphosis. Neuropsychiatric: Alert and oriented x3, affect grossly appropriate.  ECG: April 25, 2020 normal sinus rhythm rate of 62.  Recent Labwork: 06/01/2019: ALT 13; AST 18; TSH 0.567 07/06/2019: Hemoglobin 12.5; Platelets 347 02/10/2020: BUN 14; Creatinine, Ser 0.81; Magnesium 2.1; Potassium 4.0; Sodium 134     Component Value Date/Time   CHOL 174 04/26/2019 0010   TRIG 110 04/26/2019 0010   HDL 41 04/26/2019 0010   CHOLHDL 4.2 04/26/2019 0010   VLDL 22 04/26/2019 0010   LDLCALC 111 (H) 04/26/2019 0010    Other Studies Reviewed Today:  Echocardiogram 04/28/2019 1. Left ventricular ejection fraction, by estimation, is 55 to 60%. Left ventricular ejection fraction by 3D volume  is 58 %. The left ventricle has normal function. The left ventricle has no regional wall motion abnormalities. The left ventricular internal cavity size was mildly dilated. There is mild left ventricular hypertrophy. Left ventricular diastolic parameters are indeterminate. 2. Right ventricular systolic function is normal. The right ventricular size is mildly enlarged. There is mildly elevated pulmonary artery systolic pressure. The estimated right ventricular systolic pressure is 27.0 mmHg. 3. The mitral valve is normal in structure. Mild mitral valve regurgitation. 4. The aortic valve  is tricuspid. Aortic valve regurgitation is not visualized. Mild aortic stenosis. Vmax 2.8 m/s, MG 59mmHg, AVA 1.4 cm^2, DI 0.55 5. Aortic dilatation noted. There is mild dilatation of the ascending aorta measuring 36 mm. 6. The inferior vena cava is dilated in size with <50% respiratory variability, suggesting right atrial pressure of 15 mmHg.   ABI 04/11/2020 Bilateral ABIs appear essentially unchanged. Summary: Right: Resting right ankle-brachial index is within normal range. No evidence of significant right lower extremity arterial disease. The right toe-brachial index is abnormal. Left: Resting left ankle-brachial index indicates moderate left lower extremity arterial disease. The left toebrachial index is abnormal.   Cardiac monitor 08/26/2019 Study Highlights  Sinus rhythm with frequent sinus bradycardia Paroxysmal atrial fibrillation is observed (burden 67%) with rapid ventricular rates frequently noted Occasional premature ventricular contractions Baseline artifact limits interpretation at times.  Cardiac catheterization 04/27/2019 CORONARY STENT INTERVENTION  LEFT HEART CATH AND CORONARY ANGIOGRAPHY  Conclusion    Prox RCA lesion is 95% stenosed.  Post intervention, there is a 0% residual stenosis.  A stent was successfully placed.   Acute coronary syndrome secondary to high-grade  thrombotic stenosis in the very proximal RCA in a dominant RCA vessel.  Normal left coronary circulation with a short left main, large LAD and left circumflex vessels.  Dominant RCA with 95% very proximal stenosis with thrombus burden with TIMI-3 flow.  Successful percutaneous coronary prevention with PTCA and ultimate stenting with a 3.0 x18 mm Resolute Onyx DES stent postdilated to 3.73 mm tapering to 3.68 mm with the percent stenosis being reduced to 0% and brisk TIMI-3 flow without evidence for dissection.  RECOMMENDATION: Consider initial triple drug therapy for 1 month with continuation of Plavix/Xarelto for at least 6 months in this patient on long-term anticoagulation therapy.  Diagnostic Dominance: Right  Intervention      Assessment and Plan:   1. Persistent atrial fibrillation (HCC) EKG today shows normal sinus rhythm with a rate of 62.  Continue Tikosyn 250 mcg p.o. twice daily.  Continue Xarelto 20 mg daily.  Denies any blood in urine or stool.  States her stool looks darker than usual.  2. CAD in native artery Denies any anginal or exertional symptoms.  Recent complaint of significant fatigue over the last month. She states symptoms feel similar to symptoms she was having prior to her stent being placed back in March 2021.  Continue sublingual nitroglycerin as needed.  Continue Plavix 75 mg daily.  3. Essential hypertension Blood pressure well controlled on current therapy.  BP today 102/52 heart rate 62 continue spironolactone 25 mg daily, torsemide 60 mg daily.  Losartan 100 mg p.o. daily.  Continue potassium supplementation 20 mg p.o. twice daily.  4. Mixed hyperlipidemia Continue atorvastatin 80 mg p.o. daily.  Lipid panel 04/26/2019: TC 174, TG 110, HDL 41, LDL 111.  5.  Significant fatigue. Patient describes significant fatigue onset approximately 1 month ago.  States she had a recent sinus infection diagnosed on 03/31/2020 and was treated with course of  doxycycline.  Continues with some nasal stuffiness and postnasal drip.  She describes extreme fatigue with no energy, headaches, sleeping all the time.  Occasional lightheadedness.  Please get a CBC, basic metabolic panel, magnesium, thyroid panel, vitamin D, vitamin B12, hemoglobin A1c.  Medication Adjustments/Labs and Tests Ordered: Current medicines are reviewed at length with the patient today.  Concerns regarding medicines are outlined above.   Disposition: Follow-up with Dr. Harl Bowie or APP 1 month  Signed, Levell July, NP 04/25/2020  4:17 PM    [facility] at [place], [place], [place] [address] Phone: [phone]; Fax: [phone]

## 2020-04-25 ENCOUNTER — Ambulatory Visit (INDEPENDENT_AMBULATORY_CARE_PROVIDER_SITE_OTHER): Payer: Medicare Other | Admitting: Family Medicine

## 2020-04-25 ENCOUNTER — Encounter: Payer: Self-pay | Admitting: Family Medicine

## 2020-04-25 VITALS — BP 102/52 | HR 62 | Ht 64.0 in | Wt 298.0 lb

## 2020-04-25 DIAGNOSIS — I4819 Other persistent atrial fibrillation: Secondary | ICD-10-CM | POA: Diagnosis not present

## 2020-04-25 DIAGNOSIS — I10 Essential (primary) hypertension: Secondary | ICD-10-CM | POA: Diagnosis not present

## 2020-04-25 DIAGNOSIS — I2510 Atherosclerotic heart disease of native coronary artery without angina pectoris: Secondary | ICD-10-CM | POA: Diagnosis not present

## 2020-04-25 DIAGNOSIS — R5383 Other fatigue: Secondary | ICD-10-CM | POA: Diagnosis not present

## 2020-04-25 DIAGNOSIS — E782 Mixed hyperlipidemia: Secondary | ICD-10-CM

## 2020-04-25 NOTE — Patient Instructions (Signed)
Your physician recommends that you schedule a follow-up appointment in: [place], NP  Your physician recommends that you continue on your current medications as directed. Please refer to the Current Medication list given to you today.  Your physician recommends that you return for lab work CBC/BMP/MG/TSH/T3/T4/HGBA1C/B12/D3 - PLEASE FAST 6-8 HOURS PRIOR TO LAB WORK   Thank you for choosing [facility]!!

## 2020-04-28 ENCOUNTER — Other Ambulatory Visit: Payer: Self-pay

## 2020-04-28 ENCOUNTER — Ambulatory Visit (INDEPENDENT_AMBULATORY_CARE_PROVIDER_SITE_OTHER): Payer: Medicare Other | Admitting: Internal Medicine

## 2020-04-28 ENCOUNTER — Encounter: Payer: Self-pay | Admitting: Internal Medicine

## 2020-04-28 VITALS — BP 114/68 | HR 59 | Ht 64.0 in | Wt 296.2 lb

## 2020-04-28 DIAGNOSIS — R001 Bradycardia, unspecified: Secondary | ICD-10-CM

## 2020-04-28 DIAGNOSIS — I2510 Atherosclerotic heart disease of native coronary artery without angina pectoris: Secondary | ICD-10-CM

## 2020-04-28 DIAGNOSIS — I10 Essential (primary) hypertension: Secondary | ICD-10-CM | POA: Diagnosis not present

## 2020-04-28 DIAGNOSIS — I4819 Other persistent atrial fibrillation: Secondary | ICD-10-CM

## 2020-04-28 NOTE — Patient Instructions (Addendum)
Medication Instructions:  Your physician recommends that you continue on your current medications as directed. Please refer to the Current Medication list given to you today.  Labwork: None ordered.  Testing/Procedures: None ordered.  Follow-Up: Your physician wants you to follow-up in: 6 months with the Afib Clinic. They will contact you to schedule.    Any Other Special Instructions Will Be Listed Below (If Applicable).  If you need a refill on your cardiac medications before your next appointment, please call your pharmacy.         

## 2020-04-28 NOTE — Progress Notes (Signed)
PCP: Asencion Noble, MD Primary Cardiologist: Dr Harl Bowie Primary EP: Dr Alpha Gula is a 65 y.o. female who presents today for routine electrophysiology followup.  Since last being seen in our clinic, the patient reports doing reasonably well.  She is very fatigued.  Frequently sleepy.  Says that she has had a sleep study which was normal.  Not very active.  Not aware of any symptomatic afib.  Today, she denies symptoms of palpitations, chest pain, shortness of breath,    dizziness, presyncope, or syncope.  The patient is otherwise without complaint today.   Past Medical History:  Diagnosis Date  . Arthritis   . Asthma   . Cancer [facility])    multiple skin cancers  . Diabetes mellitus without complication ([place])   . Diverticulosis   . Fluid retention   . GERD (gastroesophageal reflux disease)   . High cholesterol   . Hypertension   . Incomplete rotator cuff tear   . Myocardial infarction ([place])   . Obesity   . Persistent atrial fibrillation ([place])   . PONV (postoperative nausea and vomiting)   . Wears glasses   . Wears partial dentures    bottom   Past Surgical History:  Procedure Laterality Date  . APPENDECTOMY    . CARDIOVERSION N/A 06/03/2019   Procedure: CARDIOVERSION;  Surgeon: Herminio Commons, MD;  Location: [facility];  Service: Cardiovascular;  Laterality: N/A;  . CARDIOVERSION N/A 07/13/2019   Procedure: CARDIOVERSION;  Surgeon: Geralynn Rile, MD;  Location: [place];  Service: Cardiovascular;  Laterality: N/A;  . CARPAL TUNNEL RELEASE Bilateral 2001   bilateral  . CERVICAL POLYPECTOMY  04/08/2017   Procedure: POLYPECTOMY;  Surgeon: [name], MD;  Location: [facility];  Service: Gynecology;;  Endometrial  . CHOLECYSTECTOMY    . COLONOSCOPY  2007   CHE:NIDPOEU internal hemorrhoids. Diminutive rectal polyp at 10 cm, cold biopsied/removed. The remainder of the rectal mucosa appeared normal Swallow left-sided diverticula. Diminutive polyp at the splenic  flexure cold biopsied/removed (adenomatous)  . COLONOSCOPY N/A 12/28/2013   Procedure: COLONOSCOPY;  Surgeon: Daneil Dolin, MD;  [facility];  Service: Endoscopy;  Laterality: N/A;  730 - moved to 8:30 - Ginger notified pt  . CORONARY STENT INTERVENTION N/A 04/27/2019   Procedure: CORONARY STENT INTERVENTION;  Surgeon: Troy Sine, MD;  Location: [place] CV LAB;  Service: Cardiovascular;  Laterality: N/A;  . DILATION AND CURETTAGE, DIAGNOSTIC / THERAPEUTIC  03/07/2017  . ERCP  2002  . GASTRIC BYPASS  2004  . HYSTEROSCOPY WITH D & C N/A 04/08/2017   Procedure: DILATATION AND CURETTAGE /HYSTEROSCOPY;  Surgeon: [name], MD;  Location[facility];  Service: Gynecology;  Laterality: N/A;  . INTRAVASCULAR ULTRASOUND/IVUS N/A 09/12/2017   Procedure: INTRAVASCULAR ULTRASOUND/IVUS;  Surgeon: Elam Dutch, MD;  Location: [place] CV LAB;  Service: Cardiovascular;  Laterality: N/A;  . LEFT HEART CATH AND CORONARY ANGIOGRAPHY N/A 04/27/2019   Procedure: LEFT HEART CATH AND CORONARY ANGIOGRAPHY;  Surgeon: Troy Sine, MD;  Location: [place] [facility];  Service: Cardiovascular;  Laterality: N/A;  . NECK SURGERY  1998   fusion  . PERCUTANEOUS VENOUS THROMBECTOMY,LYSIS WITH INTRAVASCULAR ULTRASOUND (IVUS) Left 09/13/2017   Procedure: REMOVAL OF LYSIS CATHETER AND INTRAVASCULAR ULTRASOUND (IVUS) LEFT ILIAC VEIN;  Surgeon: Elam Dutch, MD;  Location: [place];  Service: Vascular;  Laterality: Left;  . PERIPHERAL VASCULAR INTERVENTION  09/15/2017   Procedure: PERIPHERAL VASCULAR INTERVENTION;  Surgeon:  Waynetta Sandy, MD;  Location: [place] [facility];  Service: Cardiovascular;;  VEINOUS/STENT  . PERIPHERAL VASCULAR THROMBECTOMY Left 09/12/2017   Procedure: PERIPHERAL VASCULAR THROMBECTOMY;  Surgeon: Elam Dutch, MD;  Location: [place] [facility];  Service: Cardiovascular;  Laterality: Left;  . PERIPHERAL VASCULAR THROMBECTOMY Left 09/15/2017   Procedure: PERIPHERAL  VASCULAR THROMBECTOMY;  Surgeon: Waynetta Sandy, MD;  Location: [place] [facility];  Service: Cardiovascular;  Laterality: Left;  IVC TO LT FEM/POP VEIN  . SHOULDER ARTHROSCOPY WITH SUBACROMIAL DECOMPRESSION Left 06/23/2013   Procedure: LEFT SHOULDER ARTHROSCOPY WITH DEBRIDEMENT ROTATOR CUFF AND LABRUM;  Surgeon: Lorn Junes, MD;  Location: [place];  Service: Orthopedics;  Laterality: Left;  . SHOULDER SURGERY  1999   left    ROS- all systems are reviewed and negatives except as per HPI above  Current Outpatient Medications  Medication Sig Dispense Refill  . acetaminophen (TYLENOL) 500 MG tablet Take 500 mg by mouth every 6 (six) hours as needed for mild pain or headache.    . albuterol (VENTOLIN HFA) 108 (90 Base) MCG/ACT inhaler Inhale 2 puffs into the lungs every 6 (six) hours as needed for wheezing or shortness of breath.    Marland Kitchen atorvastatin (LIPITOR) 80 MG tablet TAKE 1 TABLET (80 MG TOTAL) BY MOUTH DAILY AT 6 PM. 90 tablet 2  . BD PEN NEEDLE NANO U/F 32G X 4 MM MISC USE FOR VICTOZA INJECTIONS    . cetirizine (ZYRTEC) 10 MG tablet Take 10 mg by mouth at bedtime.    . clopidogrel (PLAVIX) 75 MG tablet Take 1 tablet (75 mg total) by mouth daily with breakfast. 90 tablet 3  . Cyanocobalamin 1500 MCG TBDP Take 1,500 mcg by mouth daily with breakfast. Vitamin B12    . dofetilide (TIKOSYN) 250 MCG capsule Take 1 capsule (250 mcg total) by mouth 2 (two) times daily. 60 capsule 6  . fluticasone (FLONASE) 50 MCG/ACT nasal spray Place 2 sprays into both nostrils daily as needed for rhinitis (congestion/ dryness).     Marland Kitchen losartan (COZAAR) 100 MG tablet Take 100 mg by mouth daily with breakfast.     . nitroGLYCERIN (NITROSTAT) 0.4 MG SL tablet Place 1 tablet (0.4 mg total) under the tongue every 5 (five) minutes as needed for chest pain. 30 tablet 0  . potassium chloride SA (KLOR-CON) 20 MEQ tablet Take 1 tablet (20 mEq total) by mouth 2 (two) times daily.    Marland Kitchen  spironolactone (ALDACTONE) 25 MG tablet Take 25 mg by mouth daily with breakfast.     . torsemide (DEMADEX) 20 MG tablet Take 60 mg by mouth daily after breakfast.     . VICTOZA 18 MG/3ML SOPN Inject 1.8 mg into the skin at bedtime.     Alveda Reasons 20 MG TABS tablet TAKE 1 TABLET BY MOUTH EVERYDAY AT BEDTIME 30 tablet 2   No current facility-administered medications for this visit.    Physical Exam: Vitals:   04/28/20 1546  BP: 114/68  Pulse: (!) 59  SpO2: 94%  Weight: 296 lb 3.2 oz (134.4 kg)  Height: 5\' 4"  (1.626 m)    GEN- The patient is obese appearing, alert and oriented x 3 today.   Head- normocephalic, atraumatic Eyes-  Sclera clear, conjunctiva pink Ears- hearing intact Oropharynx- clear Lungs-   normal work of breathing Heart- Regular rate and rhythm  GI- soft  Extremities- no clubbing, cyanosis, + edema  Wt Readings from Last 3 Encounters:  04/28/20 296  lb 3.2 oz (134.4 kg)  04/25/20 298 lb (135.2 kg)  04/11/20 296 lb 8 oz (134.5 kg)    EKG tracing ordered today is personally reviewed and shows sinus rhythm 59 bpm, PR 178 msec, QRS 84 msec, QTc 429 msec  Assessment and Plan:  1. Persistent atrial fibrillation The patient has symptomatic, recurrent  atrial fibrillation.  Doing well with tikosyn She is on xarelto We will follow her closely on tikosyn to avoid toxicity  2. CAD No ischemic symptoms S/p prior PCI  3. Obesity Body mass index is 50.84 kg/m. Lifestyle modification is advised She is s/p prior bariatric surgery but continues to struggle with her weight.   Risks, benefits and potential toxicities for medications prescribed and/or refilled reviewed with patient today.   Follow-up in the AF clinic and with Dr Harl Bowie going forward I will see as needed  Thompson Grayer MD, [facility] 04/28/2020 3:48 PM

## 2020-05-01 ENCOUNTER — Telehealth: Payer: Self-pay | Admitting: Cardiology

## 2020-05-01 NOTE — Telephone Encounter (Signed)
Calling for lab results. °

## 2020-05-01 NOTE — Telephone Encounter (Signed)
Recent lab work showed sugar is high at 192.  Hemoglobin A1c is high at 8.1%.  Her electrolytes and her kidney function look good.  Liver function test look good.  Cholesterol looks good

## 2020-05-02 NOTE — Telephone Encounter (Signed)
Pt voiced understanding

## 2020-05-22 ENCOUNTER — Other Ambulatory Visit (HOSPITAL_COMMUNITY): Payer: Self-pay | Admitting: *Deleted

## 2020-05-22 MED ORDER — DOFETILIDE 250 MCG PO CAPS: 250.0000 ug | ORAL_CAPSULE | Freq: Two times a day (BID) | ORAL | 6 refills | Status: DC

## 2020-05-25 ENCOUNTER — Ambulatory Visit: Payer: Medicare Other | Admitting: Family Medicine

## 2020-06-01 NOTE — Progress Notes (Signed)
Cardiology Office Note  Date: 06/02/2020   ID: Amy Tran, DOB 17-Nov-1955, MRN [id number]  PCP:  Asencion Noble, MD  Cardiologist:  Carlyle Dolly, MD Electrophysiologist:  None   Chief Complaint:   History of Present Illness: Amy Tran is a 65 y.o. female with a history of persistent atrial fibrillation, CAD/MI HTN, HLD, obesity, DM2  Last seen by Dr. Rayann Heman on 04/28/2020.  She reported doing reasonably well.  She complained of being very fatigued and frequently sleepy.  Not very active.  Atrial fibrillation was not symptomatic.  She denied any palpitations, chest pain, shortness of breath, dizziness, presyncope or syncope.  She was doing well on Tikosyn and Xarelto.  Plans were to follow closely on Tikosyn to avoid toxicity.  No current ischemia symptoms.  BMI was 50.8.  Continuing to struggle with weight.  Previous bariatric surgery.  She is here for 1 month follow-up today.  She states she still feels tired.  She states she works at a [employer] from 4 AM until approximately 2 PM daily.  States when she gets home she is extremely sleepy and tired.  At last visit we ordered several labs including CBC, thyroid profile, vitamin B-12 and vitamin D.  We did receive c-Met from PCP office as well as a hemoglobin A1c which was elevated.  However there were no results from the other lab request.  Patient states after sleeping which she considers an adequate amount of time to feel rested she still feels tired the next morning.  She denies any other issues today.  She struggles with controlling her diabetes.  Her recent hemoglobin A1c was 8.1%.  She struggles with weight issues with significant morbid obesity with BMI of 50.8.  She states she cannot understand why she appears sleepy all the time and fatigue.  She states she has had a previous sleep study approximately 2 years ago which showed she had no sleep apnea per her statement.  Past Medical History:  Diagnosis Date   . Arthritis   . Asthma   . Cancer [facility])    multiple skin cancers  . Diabetes mellitus without complication ([place])   . Diverticulosis   . Fluid retention   . GERD (gastroesophageal reflux disease)   . High cholesterol   . Hypertension   . Incomplete rotator cuff tear   . Myocardial infarction ([place])   . Obesity   . Persistent atrial fibrillation ([place])   . PONV (postoperative nausea and vomiting)   . Wears glasses   . Wears partial dentures    bottom    Past Surgical History:  Procedure Laterality Date  . APPENDECTOMY    . CARDIOVERSION N/A 06/03/2019   Procedure: CARDIOVERSION;  Surgeon: Herminio Commons, MD;  Location: [facility];  Service: Cardiovascular;  Laterality: N/A;  . CARDIOVERSION N/A 07/13/2019   Procedure: CARDIOVERSION;  Surgeon: Geralynn Rile, MD;  Location: [place];  Service: Cardiovascular;  Laterality: N/A;  . CARPAL TUNNEL RELEASE Bilateral 2001   bilateral  . CERVICAL POLYPECTOMY  04/08/2017   Procedure: POLYPECTOMY;  Surgeon: [name], MD;  Location: [facility];  Service: Gynecology;;  Endometrial  . CHOLECYSTECTOMY    . COLONOSCOPY  2007   ESP:[id number] internal hemorrhoids. Diminutive rectal polyp at 10 cm, cold biopsied/removed. The remainder of the rectal mucosa appeared normal Swallow left-sided diverticula. Diminutive polyp at the splenic flexure cold biopsied/removed (adenomatous)  . COLONOSCOPY N/A 12/28/2013   Procedure: COLONOSCOPY;  Surgeon: Daneil Dolin,  MD;  [facility];  Service: Endoscopy;  Laterality: N/A;  730 - moved to 8:30 - Ginger notified pt  . CORONARY STENT INTERVENTION N/A 04/27/2019   Procedure: CORONARY STENT INTERVENTION;  Surgeon: Troy Sine, MD;  Location: [place] [facility];  Service: Cardiovascular;  Laterality: N/A;  . DILATION AND CURETTAGE, DIAGNOSTIC / THERAPEUTIC  03/07/2017  . ERCP  2002  . GASTRIC BYPASS  2004  . HYSTEROSCOPY WITH D & C N/A 04/08/2017   Procedure: DILATATION AND CURETTAGE  /HYSTEROSCOPY;  Surgeon: [name], MD;  Location: [facility];  Service: Gynecology;  Laterality: N/A;  . INTRAVASCULAR ULTRASOUND/IVUS N/A 09/12/2017   Procedure: INTRAVASCULAR ULTRASOUND/IVUS;  Surgeon: Elam Dutch, MD;  Location: [place] [facility];  Service: Cardiovascular;  Laterality: N/A;  . LEFT HEART CATH AND CORONARY ANGIOGRAPHY N/A 04/27/2019   Procedure: LEFT HEART CATH AND CORONARY ANGIOGRAPHY;  Surgeon: Troy Sine, MD;  Location: [place] [facility];  Service: Cardiovascular;  Laterality: N/A;  . NECK SURGERY  1998   fusion  . PERCUTANEOUS VENOUS THROMBECTOMY,LYSIS WITH INTRAVASCULAR ULTRASOUND (IVUS) Left 09/13/2017   Procedure: REMOVAL OF LYSIS CATHETER AND INTRAVASCULAR ULTRASOUND (IVUS) LEFT ILIAC VEIN;  Surgeon: Elam Dutch, MD;  Location: [place];  Service: Vascular;  Laterality: Left;  . PERIPHERAL VASCULAR INTERVENTION  09/15/2017   Procedure: PERIPHERAL VASCULAR INTERVENTION;  Surgeon: Waynetta Sandy, MD;  Location: [place] [facility];  Service: Cardiovascular;;  VEINOUS/STENT  . PERIPHERAL VASCULAR THROMBECTOMY Left 09/12/2017   Procedure: PERIPHERAL VASCULAR THROMBECTOMY;  Surgeon: Elam Dutch, MD;  Location: [facility];  Service: Cardiovascular;  Laterality: Left;  . PERIPHERAL VASCULAR THROMBECTOMY Left 09/15/2017   Procedure: PERIPHERAL VASCULAR THROMBECTOMY;  Surgeon: Waynetta Sandy, MD;  Location: [place] [facility];  Service: Cardiovascular;  Laterality: Left;  IVC TO LT FEM/POP VEIN  . SHOULDER ARTHROSCOPY WITH SUBACROMIAL DECOMPRESSION Left 06/23/2013   Procedure: LEFT SHOULDER ARTHROSCOPY WITH DEBRIDEMENT ROTATOR CUFF AND LABRUM;  Surgeon: Lorn Junes, MD;  Location: [place];  Service: Orthopedics;  Laterality: Left;  . SHOULDER SURGERY  1999   left    Current Outpatient Medications  Medication Sig Dispense Refill  . acetaminophen (TYLENOL) 500 MG tablet Take 500 mg by mouth every 6 (six) hours as  needed for mild pain or headache.    . albuterol (VENTOLIN HFA) 108 (90 Base) MCG/ACT inhaler Inhale 2 puffs into the lungs every 6 (six) hours as needed for wheezing or shortness of breath.    Marland Kitchen atorvastatin (LIPITOR) 80 MG tablet TAKE 1 TABLET (80 MG TOTAL) BY MOUTH DAILY AT 6 PM. 90 tablet 2  . BD PEN NEEDLE NANO U/F 32G X 4 MM MISC USE FOR VICTOZA INJECTIONS    . cetirizine (ZYRTEC) 10 MG tablet Take 10 mg by mouth at bedtime.    . clopidogrel (PLAVIX) 75 MG tablet Take 1 tablet (75 mg total) by mouth daily with breakfast. 90 tablet 3  . Cyanocobalamin 1500 MCG TBDP Take 1,500 mcg by mouth daily with breakfast. Vitamin B12    . dofetilide (TIKOSYN) 250 MCG capsule Take 1 capsule (250 mcg total) by mouth 2 (two) times daily. 60 capsule 6  . Empagliflozin (JARDIANCE PO) Take by mouth daily.    . fluticasone (FLONASE) 50 MCG/ACT nasal spray Place 2 sprays into both nostrils daily as needed for rhinitis (congestion/ dryness).     Marland Kitchen losartan (COZAAR) 100 MG tablet Take 100 mg by mouth  daily with breakfast.     . nitroGLYCERIN (NITROSTAT) 0.4 MG SL tablet Place 1 tablet (0.4 mg total) under the tongue every 5 (five) minutes as needed for chest pain. 30 tablet 0  . potassium chloride SA (KLOR-CON) 20 MEQ tablet Take 1 tablet (20 mEq total) by mouth 2 (two) times daily.    Marland Kitchen spironolactone (ALDACTONE) 25 MG tablet Take 25 mg by mouth daily with breakfast.     . torsemide (DEMADEX) 20 MG tablet Take 60 mg by mouth daily after breakfast.     . VICTOZA 18 MG/3ML SOPN Inject 1.8 mg into the skin at bedtime.     Alveda Reasons 20 MG TABS tablet TAKE 1 TABLET BY MOUTH EVERYDAY AT BEDTIME 30 tablet 2   No current facility-administered medications for this visit.   Allergies:  Penicillins   Social History: The patient  reports that she has been smoking cigarettes. She started smoking about 50 years ago. She has a 8.70 pack-year smoking history. She has never used smokeless tobacco. She reports previous alcohol  use. She reports that she does not use drugs.   Family History: The patient's family history includes Cancer in her daughter and father; Diabetes in her brother and father; Drug abuse in her sister; Heart disease in her mother; Hypertension in her brother, father, and mother; Mesothelioma in her brother; Parkinson's disease in her father.   ROS:  Please see the history of present illness. Otherwise, complete review of systems is positive for none.  All other systems are reviewed and negative.   Physical Exam: VS:  BP (!) 150/68   Pulse (!) 50   Ht _0  (1.626 m)   Wt 296 lb (134.3 kg)   SpO2 98%   BMI 50.81 kg/m , BMI Body mass index is 50.81 kg/m.  Wt Readings from Last 3 Encounters:  06/02/20 296 lb (134.3 kg)  04/28/20 296 lb 3.2 oz (134.4 kg)  04/25/20 298 lb (135.2 kg)    General: Patient appears comfortable at rest. HEENT: Conjunctiva and lids normal, oropharynx clear with moist mucosa. Neck: Supple, no elevated JVP or carotid bruits, no thyromegaly. Lungs: Clear to auscultation, nonlabored breathing at rest. Cardiac: Regular rate and rhythm, no S3 or significant systolic murmur, no pericardial rub. Abdomen: Soft, nontender, no hepatomegaly, bowel sounds present, no guarding or rebound. Extremities: No pitting edema, distal pulses 2+. Skin: Warm and dry. Musculoskeletal: No kyphosis. Neuropsychiatric: Alert and oriented x3, affect grossly appropriate.  ECG:    Recent Labwork: 07/06/2019: Hemoglobin 12.5; Platelets 347 02/10/2020: BUN 14; Creatinine, Ser 0.81; Magnesium 2.1; Potassium 4.0; Sodium 134     Component Value Date/Time   CHOL 174 04/26/2019 0010   TRIG 110 04/26/2019 0010   HDL 41 04/26/2019 0010   CHOLHDL 4.2 04/26/2019 0010   VLDL 22 04/26/2019 0010   LDLCALC 111 (H) 04/26/2019 0010    Other Studies Reviewed Today:  Echocardiogram 04/28/2019 1. Left ventricular ejection fraction, by estimation, is 55 to 60%. Left ventricular ejection fraction by 3D  volume is 58 %. The left ventricle has normal function. The left ventricle has no regional wall motion abnormalities. The left ventricular internal cavity size was mildly dilated. There is mild left ventricular hypertrophy. Left ventricular diastolic parameters are indeterminate. 2. Right ventricular systolic function is normal. The right ventricular size is mildly enlarged. There is mildly elevated pulmonary artery systolic pressure. The estimated right ventricular systolic pressure is 01.0 mmHg. 3. The mitral valve is normal in structure. Mild mitral  valve regurgitation. 4. The aortic valve is tricuspid. Aortic valve regurgitation is not visualized. Mild aortic stenosis. Vmax 2.8 m/s, MG 47mHg, AVA 1.4 cm^2, DI 0.55 5. Aortic dilatation noted. There is mild dilatation of the ascending aorta measuring 36 mm. 6. The inferior vena cava is dilated in size with <50% respiratory variability, suggesting right atrial pressure of 15 mmHg.   ABI 04/11/2020 Bilateral ABIs appear essentially unchanged. Summary: Right: Resting right ankle-brachial index is within normal range. No evidence of significant right lower extremity arterial disease. The right toe-brachial index is abnormal. Left: Resting left ankle-brachial index indicates moderate left lower extremity arterial disease. The left toebrachial index is abnormal.   Cardiac monitor 08/26/2019 Study Highlights  Sinus rhythm with frequent sinus bradycardia Paroxysmal atrial fibrillation is observed (burden 67%) with rapid ventricular rates frequently noted Occasional premature ventricular contractions Baseline artifact limits interpretation at times.  Cardiac catheterization 04/27/2019 CORONARY STENT INTERVENTION  LEFT HEART CATH AND CORONARY ANGIOGRAPHY  Conclusion    Prox RCA lesion is 95% stenosed.  Post intervention, there is a 0% residual stenosis.  A stent was successfully placed.  Acute coronary syndrome secondary to  high-grade thrombotic stenosis in the very proximal RCA in a dominant RCA vessel.  Normal left coronary circulation with a short left main, large LAD and left circumflex vessels.  Dominant RCA with 95% very proximal stenosis with thrombus burden with TIMI-3 flow.  Successful percutaneous coronary prevention with PTCA and ultimate stenting with a 3.0 x18 mm Resolute Onyx DES stent postdilated to 3.73 mm tapering to 3.68 mm with the percent stenosis being reduced to 0% and brisk TIMI-3 flow without evidence for dissection.  RECOMMENDATION: Consider initial triple drug therapy for 1 month with continuation of Plavix/Xarelto for at least 6 months in this patient on long-term anticoagulation therapy.  Diagnostic Dominance: Right  Intervention        Assessment and Plan:  1. Persistent atrial fibrillation ([place]   2. CAD in native artery   3. Essential hypertension   4. Mixed hyperlipidemia   5. Other fatigue   6. Suspected sleep apnea    1. Persistent atrial fibrillation (HCC) Rate is controlled today with a rate of 50 and regular.  Continue Xarelto 20 mg daily.  Continue Tikosyn 250 mcg p.o. twice daily.  2. CAD in native artery Denies any anginal or exertional symptoms.  Continue Plavix 75 mg daily.  Continue nitroglycerin 0.5 mg as needed.  3. Essential hypertension Blood pressure elevated today at 150/68.  Blood pressure is usually within normal limits.  Will monitor.  Continue losartan 100 mg daily.  Spironolactone 25 mg daily.  Torsemide 60 mg daily.  Potassium 20 mEq daily.  4. Mixed hyperlipidemia Continue atorvastatin 80 mg p.o. daily.  5. Other fatigue Continues to complain of significant fatigue.  Will order remainder of lab work including thyroid profile, CBC, vitamin D and vitamin B12.  Recently had lab work at PCP office with only c-Met level, lipid panel and hemoglobin A1c results.  Her hemoglobin A1c remains elevated at 8.1%.  Diabetes is managed by PCP.   Currently on Jardiance, Victoza 1.8 mg subcu daily.  6.  Suspected sleep apnea Complaining of significant sleepiness in spite of sleeping an adequate amount of time during the night.  She denies any snoring or apneic/hyper apneic periods.  [facility] sleep [facility] for suspected sleep apnea  Medication Adjustments/Labs and Tests Ordered: Current medicines are reviewed at length with the patient today.  Concerns regarding medicines are outlined above.   Disposition: Follow-up with Dr. Harl Bowie or APP 3 months  Signed, Levell July, NP 06/02/2020 9:10 AM    [place] [facility] [place], [place], [place] [address] Phone: [phone]; Fax: [phone]

## 2020-06-02 ENCOUNTER — Encounter: Payer: Self-pay | Admitting: Family Medicine

## 2020-06-02 ENCOUNTER — Ambulatory Visit (INDEPENDENT_AMBULATORY_CARE_PROVIDER_SITE_OTHER): Payer: Medicare Other | Admitting: Family Medicine

## 2020-06-02 VITALS — BP 150/68 | HR 50 | Ht 64.0 in | Wt 296.0 lb

## 2020-06-02 DIAGNOSIS — I10 Essential (primary) hypertension: Secondary | ICD-10-CM | POA: Diagnosis not present

## 2020-06-02 DIAGNOSIS — E782 Mixed hyperlipidemia: Secondary | ICD-10-CM | POA: Diagnosis not present

## 2020-06-02 DIAGNOSIS — I4819 Other persistent atrial fibrillation: Secondary | ICD-10-CM

## 2020-06-02 DIAGNOSIS — I2510 Atherosclerotic heart disease of native coronary artery without angina pectoris: Secondary | ICD-10-CM | POA: Diagnosis not present

## 2020-06-02 DIAGNOSIS — R5383 Other fatigue: Secondary | ICD-10-CM

## 2020-06-02 DIAGNOSIS — R29818 Other symptoms and signs involving the nervous system: Secondary | ICD-10-CM

## 2020-06-02 NOTE — Patient Instructions (Addendum)
Medication Instructions:  Continue all current medications.  Labwork:  CBC, Vit B12, Vit D, TSH, T3, T4 - orders given today.   Office will contact with results via phone or letter.    Testing/Procedures: none  Follow-Up: 3 months   Any Other Special Instructions Will Be Listed Below (If Applicable). You have been referred to:  [facility]  If you need a refill on your cardiac medications before your next appointment, please call your pharmacy.

## 2020-06-03 LAB — CBC
Hematocrit: 38 % (ref 34.0–46.6)
Hemoglobin: 12.2 g/dL (ref 11.1–15.9)
MCH: 26.8 pg (ref 26.6–33.0)
MCHC: 32.1 g/dL (ref 31.5–35.7)
MCV: 83 fL (ref 79–97)
Platelets: 329 10*3/uL (ref 150–450)
RBC: 4.56 x10E6/uL (ref 3.77–5.28)
RDW: 14.4 % (ref 11.7–15.4)
WBC: 8.2 10*3/uL (ref 3.4–10.8)

## 2020-06-03 LAB — TSH: TSH: 0.541 u[IU]/mL (ref 0.450–4.500)

## 2020-06-03 LAB — T4, FREE: Free T4: 1.23 ng/dL (ref 0.82–1.77)

## 2020-06-03 LAB — T3, FREE: T3, Free: 3.7 pg/mL (ref 2.0–4.4)

## 2020-06-03 LAB — VITAMIN B12: Vitamin B-12: >2000 pg/mL — ABNORMAL HIGH (ref 232–1245)

## 2020-06-03 LAB — VITAMIN D 25 HYDROXY (VIT D DEFICIENCY, FRACTURES): Vit D, 25-Hydroxy: 16.1 ng/mL — ABNORMAL LOW (ref 30.0–100.0)

## 2020-06-06 ENCOUNTER — Telehealth: Payer: Self-pay | Admitting: *Deleted

## 2020-06-06 NOTE — Telephone Encounter (Signed)
Laurine Blazer, LPN  6/71/2458 0:99 PM EDT Back to Top     Notified, copy to pcp.    Laurine Blazer, LPN  8/33/8250 5:39 PM EDT      Left message to return call.   Verta Ellen., NP  06/04/2020 5:36 PM EDT      All labs look good except Vitamin D is low. Tell her to follow with PCP for Vitamin D replacement

## 2020-06-10 ENCOUNTER — Other Ambulatory Visit: Payer: Self-pay | Admitting: Physician Assistant

## 2020-06-13 ENCOUNTER — Other Ambulatory Visit: Payer: Self-pay | Admitting: *Deleted

## 2020-06-13 DIAGNOSIS — R5383 Other fatigue: Secondary | ICD-10-CM

## 2020-06-13 DIAGNOSIS — R29818 Other symptoms and signs involving the nervous system: Secondary | ICD-10-CM

## 2020-07-13 ENCOUNTER — Emergency Department (HOSPITAL_COMMUNITY): Payer: Medicare Other

## 2020-07-13 ENCOUNTER — Other Ambulatory Visit: Payer: Self-pay

## 2020-07-13 ENCOUNTER — Encounter (HOSPITAL_COMMUNITY): Payer: Self-pay | Admitting: *Deleted

## 2020-07-13 ENCOUNTER — Emergency Department (HOSPITAL_COMMUNITY)
Admission: EM | Admit: 2020-07-13 | Discharge: 2020-07-13 | Disposition: A | Discharge status: Home / Self Care | Payer: Medicare Other | Attending: Emergency Medicine | Admitting: Emergency Medicine

## 2020-07-13 DIAGNOSIS — M25562 Pain in left knee: Secondary | ICD-10-CM | POA: Diagnosis not present

## 2020-07-13 DIAGNOSIS — Z7902 Long term (current) use of antithrombotics/antiplatelets: Secondary | ICD-10-CM | POA: Insufficient documentation

## 2020-07-13 DIAGNOSIS — J45909 Unspecified asthma, uncomplicated: Secondary | ICD-10-CM | POA: Insufficient documentation

## 2020-07-13 DIAGNOSIS — F17210 Nicotine dependence, cigarettes, uncomplicated: Secondary | ICD-10-CM | POA: Insufficient documentation

## 2020-07-13 DIAGNOSIS — Z85828 Personal history of other malignant neoplasm of skin: Secondary | ICD-10-CM | POA: Insufficient documentation

## 2020-07-13 DIAGNOSIS — Z7901 Long term (current) use of anticoagulants: Secondary | ICD-10-CM | POA: Diagnosis not present

## 2020-07-13 DIAGNOSIS — I10 Essential (primary) hypertension: Secondary | ICD-10-CM | POA: Diagnosis not present

## 2020-07-13 DIAGNOSIS — Z79899 Other long term (current) drug therapy: Secondary | ICD-10-CM | POA: Insufficient documentation

## 2020-07-13 DIAGNOSIS — E119 Type 2 diabetes mellitus without complications: Secondary | ICD-10-CM | POA: Diagnosis not present

## 2020-07-13 NOTE — ED Triage Notes (Signed)
Left knee pain onset today

## 2020-07-13 NOTE — ED Provider Notes (Signed)
[facility] EMERGENCY DEPARTMENT Provider Note   CSN: [id number] Arrival date & time: 07/13/20  1511     History Chief Complaint  Patient presents with  . Knee Pain    Amy Tran is a 65 y.o. female.  HPI   Patient with significant medical history of diabetes, GERD, obesity presents with chief complaint of left knee pain.  Patient states this started today, she went to walk up a few steps and when she stepped up she felt her knee bending backwards and felt severe pain in her knee.  She denies actually falling, hitting her head or losing conscious.  Patient states she has pain when she tries to ambulate, or try to move her knee, she denies paresthesias or weakness that leg, denies any urinary symptoms.  She denies any alleviating factors.  Patient denies headaches, fevers, chills, shortness breath or chest pain.  Past Medical History:  Diagnosis Date  . Arthritis   . Asthma   . Cancer [facility])    multiple skin cancers  . Diabetes mellitus without complication ([place])   . Diverticulosis   . Fluid retention   . GERD (gastroesophageal reflux disease)   . High cholesterol   . Hypertension   . Incomplete rotator cuff tear   . Myocardial infarction ([place])   . Obesity   . Persistent atrial fibrillation ([place])   . PONV (postoperative nausea and vomiting)   . Wears glasses   . Wears partial dentures    bottom    Patient Active Problem List   Diagnosis Date Noted  . Persistent atrial fibrillation ([place]) 07/06/2019  . Secondary hypercoagulable state ([place]) 07/06/2019  . Acute coronary syndrome ([place])   . Chest pain 04/26/2019  . Tobacco dependence 04/26/2019  . NSTEMI (non-ST elevated myocardial infarction) ([place]) 04/26/2019  . Iliac vein thrombosis, left ([place]) 09/12/2017  . Hypokalemia 04/02/2017  . GERD (gastroesophageal reflux disease) 10/21/2016  . Abdominal pain 10/21/2016  . Extrinsic asthma without complication 70/35/0093  . Oral thrush 07/01/2016  . Rectocele 09/19/2015   . Diverticulosis of colon without hemorrhage   . Hx of adenomatous colonic polyps 12/01/2013  . Pain in joint, shoulder region 06/24/2013  . Muscle weakness (generalized) 06/24/2013  . Incomplete rotator cuff tear   . Diabetes mellitus without complication ([place])   . PONV (postoperative nausea and vomiting)   . Fluid retention   . Hypertension     Past Surgical History:  Procedure Laterality Date  . APPENDECTOMY    . CARDIOVERSION N/A 06/03/2019   Procedure: CARDIOVERSION;  Surgeon: Herminio Commons, MD;  Location: [facility];  Service: Cardiovascular;  Laterality: N/A;  . CARDIOVERSION N/A 07/13/2019   Procedure: CARDIOVERSION;  Surgeon: Geralynn Rile, MD;  Location: [place];  Service: Cardiovascular;  Laterality: N/A;  . CARPAL TUNNEL RELEASE Bilateral 2001   bilateral  . CERVICAL POLYPECTOMY  04/08/2017   Procedure: POLYPECTOMY;  Surgeon: [name], MD;  Location: [facility];  Service: Gynecology;;  Endometrial  . CHOLECYSTECTOMY    . COLONOSCOPY  2007   GHW:EXHBZJI internal hemorrhoids. Diminutive rectal polyp at 10 cm, cold biopsied/removed. The remainder of the rectal mucosa appeared normal Swallow left-sided diverticula. Diminutive polyp at the splenic flexure cold biopsied/removed (adenomatous)  . COLONOSCOPY N/A 12/28/2013   Procedure: COLONOSCOPY;  Surgeon: Daneil Dolin, MD;  Location: [facility];  Service: Endoscopy;  Laterality: N/A;  730 - moved to 8:30 - Ginger notified pt  . CORONARY STENT INTERVENTION N/A 04/27/2019   Procedure:  CORONARY STENT INTERVENTION;  Surgeon: Troy Sine, MD;  Location: [place] [facility];  Service: Cardiovascular;  Laterality: N/A;  . DILATION AND CURETTAGE, DIAGNOSTIC / THERAPEUTIC  03/07/2017  . ERCP  2002  . GASTRIC BYPASS  2004  . HYSTEROSCOPY WITH D & C N/A 04/08/2017   Procedure: DILATATION AND CURETTAGE /HYSTEROSCOPY;  Surgeon: [name], MD;  Location: [facility];  Service: Gynecology;  Laterality: N/A;  .  INTRAVASCULAR ULTRASOUND/IVUS N/A 09/12/2017   Procedure: INTRAVASCULAR ULTRASOUND/IVUS;  Surgeon: Elam Dutch, MD;  Location: [place] [facility];  Service: Cardiovascular;  Laterality: N/A;  . LEFT HEART CATH AND CORONARY ANGIOGRAPHY N/A 04/27/2019   Procedure: LEFT HEART CATH AND CORONARY ANGIOGRAPHY;  Surgeon: Troy Sine, MD;  Location: [place] [facility];  Service: Cardiovascular;  Laterality: N/A;  . NECK SURGERY  1998   fusion  . PERCUTANEOUS VENOUS THROMBECTOMY,LYSIS WITH INTRAVASCULAR ULTRASOUND (IVUS) Left 09/13/2017   Procedure: REMOVAL OF LYSIS CATHETER AND INTRAVASCULAR ULTRASOUND (IVUS) LEFT ILIAC VEIN;  Surgeon: Elam Dutch, MD;  Location: [place];  Service: Vascular;  Laterality: Left;  . PERIPHERAL VASCULAR INTERVENTION  09/15/2017   Procedure: PERIPHERAL VASCULAR INTERVENTION;  Surgeon: Waynetta Sandy, MD;  Location: [place] [facility];  Service: Cardiovascular;;  VEINOUS/STENT  . PERIPHERAL VASCULAR THROMBECTOMY Left 09/12/2017   Procedure: PERIPHERAL VASCULAR THROMBECTOMY;  Surgeon: Elam Dutch, MD;  Location: [place] [facility];  Service: Cardiovascular;  Laterality: Left;  . PERIPHERAL VASCULAR THROMBECTOMY Left 09/15/2017   Procedure: PERIPHERAL VASCULAR THROMBECTOMY;  Surgeon: Waynetta Sandy, MD;  Location: [place] [facility];  Service: Cardiovascular;  Laterality: Left;  IVC TO LT FEM/POP VEIN  . SHOULDER ARTHROSCOPY WITH SUBACROMIAL DECOMPRESSION Left 06/23/2013   Procedure: LEFT SHOULDER ARTHROSCOPY WITH DEBRIDEMENT ROTATOR CUFF AND LABRUM;  Surgeon: Lorn Junes, MD;  Location: [place];  Service: Orthopedics;  Laterality: Left;  . SHOULDER SURGERY  1999   left     OB History    Gravida  2   Para  2   Term  2   Preterm      AB      Living  2     SAB      IAB      Ectopic      Multiple      Live Births  2           Family History  Problem Relation Age of Onset  . Diabetes Father   .  Hypertension Father   . Parkinson's disease Father   . Cancer Father        not sure what kind  . Hypertension Mother   . Heart disease Mother   . Drug abuse Sister   . Mesothelioma Brother   . Cancer Daughter        non-hodgkins lmphoma  . Hypertension Brother   . Diabetes Brother   . Colon cancer Neg Hx   . Gastric cancer Neg Hx   . Esophageal cancer Neg Hx     Social History   Tobacco Use  . Smoking status: Current Every Day Smoker    Packs/day: 0.30    Years: 29.00    Pack years: 8.70    Types: Cigarettes    Start date: 04/09/1970  . Smokeless tobacco: Never Used  . Tobacco comment: 5 cigarettes-half pack a day   Vaping Use  . Vaping Use: Never used  Substance Use Topics  . Alcohol  use: Not Currently  . Drug use: No    Home Medications Prior to Admission medications   Medication Sig Start Date End Date Taking? Authorizing Provider  acetaminophen (TYLENOL) 500 MG tablet Take 500 mg by mouth every 6 (six) hours as needed for mild pain or headache.    [provider]  albuterol (VENTOLIN HFA) 108 (90 Base) MCG/ACT inhaler Inhale 2 puffs into the lungs every 6 (six) hours as needed for wheezing or shortness of breath.    [provider]  atorvastatin (LIPITOR) 80 MG tablet TAKE 1 TABLET (80 MG TOTAL) BY MOUTH DAILY AT 6 PM. 11/29/19   Branch, Alphonse Guild, MD  BD PEN NEEDLE NANO U/F 32G X 4 MM MISC USE FOR [place] INJECTIONS 08/03/18   [provider]  cetirizine (ZYRTEC) 10 MG tablet Take 10 mg by mouth at bedtime.    [provider]  clopidogrel (PLAVIX) 75 MG tablet TAKE 1 TABLET (75 MG TOTAL) BY MOUTH DAILY WITH BREAKFAST. 06/12/20   Imogene Burn, PA-C  Cyanocobalamin 1500 MCG TBDP Take 1,500 mcg by mouth daily with breakfast. Vitamin B12    [provider]  dofetilide (TIKOSYN) 250 MCG capsule Take 1 capsule (250 mcg total) by mouth 2 (two) times daily. 05/22/20   Fenton, Clint R, PA  Empagliflozin (JARDIANCE PO) Take by  mouth daily.    [provider]  fluticasone (FLONASE) 50 MCG/ACT nasal spray Place 2 sprays into both nostrils daily as needed for rhinitis (congestion/ dryness).  06/29/18   [provider]  losartan (COZAAR) 100 MG tablet Take 100 mg by mouth daily with breakfast.     [provider]  nitroGLYCERIN (NITROSTAT) 0.4 MG SL tablet Place 1 tablet (0.4 mg total) under the tongue every 5 (five) minutes as needed for chest pain. 04/28/19   Florencia Reasons, MD  potassium chloride SA (KLOR-CON) 20 MEQ tablet Take 1 tablet (20 mEq total) by mouth 2 (two) times daily. 09/23/19   Shirley Friar, PA-C  spironolactone (ALDACTONE) 25 MG tablet Take 25 mg by mouth daily with breakfast.     [provider]  torsemide (DEMADEX) 20 MG tablet Take 60 mg by mouth daily after breakfast.     [provider]  VICTOZA 18 MG/3ML SOPN Inject 1.8 mg into the skin at bedtime.  08/04/18   [provider]  XARELTO 20 MG TABS tablet TAKE 1 TABLET BY MOUTH EVERYDAY AT BEDTIME 02/21/20   Waynetta Sandy, MD    Allergies    Penicillins  Review of Systems   Review of Systems  Constitutional: Negative for chills and fever.  HENT: Negative for congestion.   Respiratory: Negative for shortness of breath.   Cardiovascular: Negative for chest pain.  Gastrointestinal: Negative for abdominal pain.  Genitourinary: Negative for enuresis.  Musculoskeletal: Negative for back pain.       Left knee pain.  Skin: Negative for rash.  Neurological: Negative for headaches.  Hematological: Does not bruise/bleed easily.    Physical Exam Updated Vital Signs BP (!) 127/59 (BP Location: Right Arm)   Pulse (!) 57   Temp 98.3 F (36.8 C) (Oral)   Resp 18   SpO2 96%   Physical Exam Vitals and nursing note reviewed.  Constitutional:      General: She is not in acute distress.    Appearance: She is not ill-appearing.  HENT:     Head: Normocephalic and atraumatic.     Nose:  No congestion.  Eyes:     Conjunctiva/sclera: Conjunctivae normal.  Cardiovascular:     Rate and Rhythm: Normal rate and regular rhythm.  Pulmonary:     Effort: Pulmonary effort is normal.  Musculoskeletal:        General: Tenderness present. No swelling.     Right lower leg: No edema.     Left lower leg: No edema.     Comments: Lower extremities were visualized she has bilateral pedal edema no unilateral leg swelling, patient's left knee did not appear to be more swollen the right, there is no erythema noted, she had slightly decreased range of motion with flexion and extension, secondary to pain.  There is no noted joint laxity present my exam.  Neurovascular intact in lower extremities.  Skin:    General: Skin is warm and dry.  Neurological:     Mental Status: She is alert.  Psychiatric:        Mood and Affect: Mood normal.     ED Results / Procedures / Treatments   Labs (all labs ordered are listed, but only abnormal results are displayed) Labs Reviewed - No data to display  EKG None  Radiology DG Knee Complete 4 Views Left  Result Date: 07/13/2020 CLINICAL DATA:  Left knee pain, possible hyperextension injury, initial encounter EXAM: LEFT KNEE - COMPLETE 4+ VIEW COMPARISON:  None. FINDINGS: Mild patellofemoral degenerative changes are noted. Mild soft tissue swelling about the knee is seen. No acute fracture or dislocation is noted. IMPRESSION: Mild degenerative change in soft tissue swelling. No acute bony abnormality is noted. Electronically Signed   By: Inez Catalina M.D.   On: 07/13/2020 16:45    Procedures Procedures   Medications Ordered in ED Medications - No data to display  ED Course  I have reviewed the triage vital signs and the nursing notes.  Pertinent labs & imaging results that were available during my care of the patient were reviewed by me and considered in my medical decision making (see chart for details).    MDM Rules/Calculators/A&P                           Initial impression-patient presents with left-sided knee pain.  She is alert, does not appear in distress, vital signs reassuring.  Will obtain imaging for further evaluation.  Work-up-no acute findings seen on x-ray.  Rule out-low suspicion for DVT as is no unilateral leg swelling, patient had no tenderness along her calf, no palpable cords felt, presentation is more consistent with a muscular strain as pain started after she bent it backwards. I have low suspicion for septic arthritis as patient denies IV drug use, skin exam was performed no erythematous, edematous, warm joints noted on exam.  Low suspicion for fracture or dislocation as x-ray does not feel any significant findings. low suspicion for ligament or tendon damage as area was palpated no gross defects noted, patient does have noted decreased range of motion at the knee but I suspect this secondary due to pain.  Low suspicion for compartment syndrome as area was palpated it was soft to the touch, neurovascular fully intact.   Plan-  1.  Left knee pain-I suspect secondary due to muscular strain but cannot exclude possibility of a ligament damage, will place her in a knee brace, recommend crutches, nonweightbearing and follow-up with orthopedic surgeon further evaluation.  Vital signs have remained stable, no indication for hospital admission. Patient given at home care as well  strict return precautions.  Patient verbalized that they understood agreed to said plan.   Final Clinical Impression(s) / ED Diagnoses Final diagnoses:  Acute pain of left knee    Rx / DC Orders ED Discharge Orders    None       Marcello Fennel, PA-C 07/13/20 1716    Milton Ferguson, MD 07/14/20 2253

## 2020-07-13 NOTE — Discharge Instructions (Signed)
Your imaging looks reassuring but I cannot exclude possible of a ligament or tendon damage of your left knee.  I placed you in a knee brace  please leave on during the day, you may take off at nighttime.  I want you to be nonweightbearing on that leg.  I recommend over-the-counter pain medications as needed.  Please follow-up with orthopedic surgery for further evaluation.  Come back to the emergency department if you develop chest pain, shortness of breath, severe abdominal pain, uncontrolled nausea, vomiting, diarrhea.

## 2020-07-17 ENCOUNTER — Other Ambulatory Visit: Payer: Self-pay | Admitting: Orthopedic Surgery

## 2020-07-17 DIAGNOSIS — M25561 Pain in right knee: Secondary | ICD-10-CM

## 2020-07-18 ENCOUNTER — Other Ambulatory Visit: Payer: Self-pay | Admitting: Orthopedic Surgery

## 2020-07-18 DIAGNOSIS — M25562 Pain in left knee: Secondary | ICD-10-CM

## 2020-07-25 ENCOUNTER — Other Ambulatory Visit: Payer: Self-pay

## 2020-07-25 ENCOUNTER — Ambulatory Visit
Admission: RE | Admit: 2020-07-25 | Discharge: 2020-07-25 | Disposition: A | Discharge status: Home / Self Care | Payer: Medicare Other | Source: Ambulatory Visit | Attending: Orthopedic Surgery | Admitting: Orthopedic Surgery

## 2020-07-25 DIAGNOSIS — M25562 Pain in left knee: Secondary | ICD-10-CM

## 2020-07-28 ENCOUNTER — Ambulatory Visit: Payer: Medicaid Other | Admitting: Cardiology

## 2020-09-03 NOTE — Progress Notes (Signed)
Cardiology Office Note  Date: 09/04/2020   ID: Amy Tran, DOB 07/15/1955, MRN [id number]  PCP:  Asencion Noble, MD  Cardiologist:  Carlyle Dolly, MD Electrophysiologist:  None   Chief Complaint: 3 month follow up  History of Present Illness: Amy Tran is a 65 y.o. female with a history of persistent atrial fibrillation, CAD/MI HTN, HLD, obesity, DM2  Last seen by Dr. Rayann Heman on 04/28/2020.  She reported doing reasonably well.  She complained of being very fatigued and frequently sleepy.  Not very active.  Atrial fibrillation was not symptomatic.  She denied any palpitations, chest pain, shortness of breath, dizziness, presyncope or syncope.  She was doing well on Tikosyn and Xarelto.  Plans were to follow closely on Tikosyn to avoid toxicity.  No current ischemia symptoms.  BMI was 50.8.  Continuing to struggle with weight.  Previous bariatric surgery.  She was last here for 1 month follow.  She stated she was feeling tired all the time.  Lab work showed she was vitamin D deficient.  Her PCP started her on vitamin D therapy.  Today she states she is feeling somewhat better since starting the medication.  At last visit we ordered a sleep referral due to her fatigue and excessive sleepiness.  Apparently since that visit she had changed her mind about pursuing sleep evaluation. Her recent hemoglobin A1c was 8.1%.  She struggles with weight issues with significant morbid obesity with BMI of 50.8.  She has having issues with her left leg with swelling in her left calf.  She sees Dr. [facility].  She states she is seeking a second opinion with [facility] in the near future for her left leg issues.  She has a history of angioplasty of left common femoral vein but left common and external iliac venous stenting in August 2019.  She has chronic left lower extremity edema secondary to postphlebitic syndrome.  She currently takes Xarelto.  She last saw Laurence Slate,  PA-C at [facility] on 04/11/2020.  Plan was to repeat ABI and iliac/IVC duplex.  She has these upcoming tests scheduled for October 09, 2020   Past Medical History:  Diagnosis Date   Arthritis    Asthma    Cancer ([place])    multiple skin cancers   Diabetes mellitus without complication ([place])    Diverticulosis    Fluid retention    GERD (gastroesophageal reflux disease)    High cholesterol    Hypertension    Incomplete rotator cuff tear    Myocardial infarction ([place])    Obesity    Persistent atrial fibrillation (HCC)    PONV (postoperative nausea and vomiting)    Wears glasses    Wears partial dentures    bottom    Past Surgical History:  Procedure Laterality Date   APPENDECTOMY     CARDIOVERSION N/A 06/03/2019   Procedure: CARDIOVERSION;  Surgeon: Herminio Commons, MD;  Location: [facility];  Service: Cardiovascular;  Laterality: N/A;   CARDIOVERSION N/A 07/13/2019   Procedure: CARDIOVERSION;  Surgeon: Geralynn Rile, MD;  Location: [facility];  Service: Cardiovascular;  Laterality: N/A;   CARPAL TUNNEL RELEASE Bilateral 2001   bilateral   CERVICAL POLYPECTOMY  04/08/2017   Procedure: POLYPECTOMY;  Surgeon: [name], MD;  Location: [facility];  Service: Gynecology;;  Endometrial   CHOLECYSTECTOMY     COLONOSCOPY  2007   [id number] internal hemorrhoids. Diminutive rectal polyp at 10 cm, cold  biopsied/removed. The remainder of the rectal mucosa appeared normal Swallow left-sided diverticula. Diminutive polyp at the splenic flexure cold biopsied/removed (adenomatous)   COLONOSCOPY N/A 12/28/2013   Procedure: COLONOSCOPY;  Surgeon: Daneil Dolin, MD;  [facility];  Service: Endoscopy;  Laterality: N/A;  730 - moved to 8:30 - Ginger notified pt   CORONARY STENT INTERVENTION N/A 04/27/2019   Procedure: CORONARY STENT INTERVENTION;  Surgeon: Troy Sine, MD;  Location: [facility];  Service: Cardiovascular;  Laterality: N/A;   DILATION AND  CURETTAGE, DIAGNOSTIC / THERAPEUTIC  03/07/2017   ERCP  2002   GASTRIC BYPASS  2004   HYSTEROSCOPY WITH D & C N/A 04/08/2017   Procedure: DILATATION AND CURETTAGE /HYSTEROSCOPY;  Surgeon: [name], MD;  Location: [facility];  Service: Gynecology;  Laterality: N/A;   INTRAVASCULAR ULTRASOUND/IVUS N/A 09/12/2017   Procedure: INTRAVASCULAR ULTRASOUND/IVUS;  Surgeon: Elam Dutch, MD;  Location: [facility];  Service: Cardiovascular;  Laterality: N/A;   LEFT HEART CATH AND CORONARY ANGIOGRAPHY N/A 04/27/2019   Procedure: LEFT HEART CATH AND CORONARY ANGIOGRAPHY;  Surgeon: Troy Sine, MD;  Location: [facility];  Service: Cardiovascular;  Laterality: N/A;   NECK SURGERY  1998   fusion   PERCUTANEOUS VENOUS THROMBECTOMY,LYSIS WITH INTRAVASCULAR ULTRASOUND (IVUS) Left 09/13/2017   Procedure: REMOVAL OF LYSIS CATHETER AND INTRAVASCULAR ULTRASOUND (IVUS) LEFT ILIAC VEIN;  Surgeon: Elam Dutch, MD;  Location: [facility];  Service: Vascular;  Laterality: Left;   PERIPHERAL VASCULAR INTERVENTION  09/15/2017   Procedure: PERIPHERAL VASCULAR INTERVENTION;  Surgeon: Waynetta Sandy, MD;  Location: [facility];  Service: Cardiovascular;;  VEINOUS/STENT   PERIPHERAL VASCULAR THROMBECTOMY Left 09/12/2017   Procedure: PERIPHERAL VASCULAR THROMBECTOMY;  Surgeon: Elam Dutch, MD;  Location: [facility];  Service: Cardiovascular;  Laterality: Left;   PERIPHERAL VASCULAR THROMBECTOMY Left 09/15/2017   Procedure: PERIPHERAL VASCULAR THROMBECTOMY;  Surgeon: Waynetta Sandy, MD;  Location: [facility];  Service: Cardiovascular;  Laterality: Left;  IVC TO LT FEM/POP VEIN   SHOULDER ARTHROSCOPY WITH SUBACROMIAL DECOMPRESSION Left 06/23/2013   Procedure: LEFT SHOULDER ARTHROSCOPY WITH DEBRIDEMENT ROTATOR CUFF AND LABRUM;  Surgeon: Lorn Junes, MD;  Location: [facility];  Service: Orthopedics;  Laterality: Left;   SHOULDER SURGERY  1999   left     Current Outpatient Medications  Medication Sig Dispense Refill   acetaminophen (TYLENOL) 500 MG tablet Take 500 mg by mouth every 6 (six) hours as needed for mild pain or headache.     albuterol (VENTOLIN HFA) 108 (90 Base) MCG/ACT inhaler Inhale 2 puffs into the lungs every 6 (six) hours as needed for wheezing or shortness of breath.     atorvastatin (LIPITOR) 80 MG tablet TAKE 1 TABLET (80 MG TOTAL) BY MOUTH DAILY AT 6 PM. 90 tablet 2   BD PEN NEEDLE NANO U/F 32G X 4 MM MISC USE FOR VICTOZA INJECTIONS     cetirizine (ZYRTEC) 10 MG tablet Take 10 mg by mouth at bedtime.     clopidogrel (PLAVIX) 75 MG tablet TAKE 1 TABLET (75 MG TOTAL) BY MOUTH DAILY WITH BREAKFAST. 90 tablet 3   dofetilide (TIKOSYN) 250 MCG capsule Take 1 capsule (250 mcg total) by mouth 2 (two) times daily. 60 capsule 6   Empagliflozin (JARDIANCE PO) Take by mouth daily.     fluticasone (FLONASE) 50 MCG/ACT nasal spray Place 2 sprays into both nostrils daily as needed for rhinitis (  congestion/ dryness).      losartan (COZAAR) 100 MG tablet Take 100 mg by mouth daily with breakfast.      nitroGLYCERIN (NITROSTAT) 0.4 MG SL tablet Place 1 tablet (0.4 mg total) under the tongue every 5 (five) minutes as needed for chest pain. 30 tablet 0   potassium chloride SA (KLOR-CON) 20 MEQ tablet Take 1 tablet (20 mEq total) by mouth 2 (two) times daily.     spironolactone (ALDACTONE) 25 MG tablet Take 25 mg by mouth daily with breakfast.      torsemide (DEMADEX) 20 MG tablet Take 60 mg by mouth daily after breakfast.      VICTOZA 18 MG/3ML SOPN Inject 1.8 mg into the skin at bedtime.      VITAMIN D PO Take 2 capsules by mouth daily.     XARELTO 20 MG TABS tablet TAKE 1 TABLET BY MOUTH EVERYDAY AT BEDTIME 30 tablet 2   Cyanocobalamin 1500 MCG TBDP Take 1,500 mcg by mouth daily with breakfast. Vitamin B12 (Patient not taking: Reported on 09/04/2020)     No current facility-administered medications for this visit.   Allergies:   Penicillins   Social History: The patient  reports that she has been smoking cigarettes. She started smoking about 50 years ago. She has a 8.70 pack-year smoking history. She has never used smokeless tobacco. She reports previous alcohol use. She reports that she does not use drugs.   Family History: The patient's family history includes Cancer in her daughter and father; Diabetes in her brother and father; Drug abuse in her sister; Heart disease in her mother; Hypertension in her brother, father, and mother; Mesothelioma in her brother; Parkinson's disease in her father.   ROS:  Please see the history of present illness. Otherwise, complete review of systems is positive for none.  All other systems are reviewed and negative.   Physical Exam: VS:  BP 140/74   Pulse (!) 49   Ht '5\' 4"'$  (1.626 m)   Wt 290 lb 6.4 oz (131.7 kg)   SpO2 95%   BMI 49.85 kg/m , BMI Body mass index is 49.85 kg/m.  Wt Readings from Last 3 Encounters:  09/04/20 290 lb 6.4 oz (131.7 kg)  06/02/20 296 lb (134.3 kg)  04/28/20 296 lb 3.2 oz (134.4 kg)    General: Morbidly obese patient appears comfortable at rest. Neck: Supple, no elevated JVP or carotid bruits, no thyromegaly. Lungs: Clear to auscultation, nonlabored breathing at rest. Cardiac: Regular rate and rhythm, no S3 or significant systolic murmur, no pericardial rub. Extremities: No pitting edema, distal pulses 2+.  Left lower extremity swelling in the left upper calf. Skin: Warm and dry. Musculoskeletal: No kyphosis. Neuropsychiatric: Alert and oriented x3, affect grossly appropriate.  ECG:    Recent Labwork: 02/10/2020: BUN 14; Creatinine, Ser 0.81; Magnesium 2.1; Potassium 4.0; Sodium 134 06/02/2020: Hemoglobin 12.2; Platelets 329; TSH 0.541     Component Value Date/Time   CHOL 174 04/26/2019 0010   TRIG 110 04/26/2019 0010   HDL 41 04/26/2019 0010   CHOLHDL 4.2 04/26/2019 0010   VLDL 22 04/26/2019 0010   LDLCALC 111 (H) 04/26/2019 0010     Other Studies Reviewed Today:   Echocardiogram 04/28/2019 1. Left ventricular ejection fraction, by estimation, is 55 to 60%. Left ventricular ejection fraction by 3D volume is 58 %. The left ventricle has normal function. The left ventricle has no regional wall motion abnormalities. The left ventricular internal cavity size was mildly dilated. There is mild  left ventricular hypertrophy. Left ventricular diastolic parameters are indeterminate. 2. Right ventricular systolic function is normal. The right ventricular size is mildly enlarged. There is mildly elevated pulmonary artery systolic pressure. The estimated right ventricular systolic pressure is 99991111 mmHg. 3. The mitral valve is normal in structure. Mild mitral valve regurgitation. 4. The aortic valve is tricuspid. Aortic valve regurgitation is not visualized. Mild aortic stenosis. Vmax 2.8 m/s, MG 19mHg, AVA 1.4 cm^2, DI 0.55 5. Aortic dilatation noted. There is mild dilatation of the ascending aorta measuring 36 mm. 6. The inferior vena cava is dilated in size with <50% respiratory variability, suggesting right atrial pressure of 15 mmHg.     ABI 04/11/2020 Bilateral ABIs appear essentially unchanged. Summary: Right: Resting right ankle-brachial index is within normal range. No evidence of significant right lower extremity arterial disease. The right toe-brachial index is abnormal. Left: Resting left ankle-brachial index indicates moderate left lower extremity arterial disease. The left toebrachial index is abnormal.     Cardiac monitor 08/26/2019 Study Highlights   Sinus rhythm with frequent sinus bradycardia Paroxysmal atrial fibrillation is observed (burden 67%) with rapid ventricular rates frequently noted Occasional premature ventricular contractions Baseline artifact limits interpretation at times.   Cardiac catheterization 04/27/2019 CORONARY STENT INTERVENTION  LEFT HEART CATH AND CORONARY ANGIOGRAPHY   Conclusion     Prox RCA lesion is 95% stenosed. Post intervention, there is a 0% residual stenosis. A stent was successfully placed.   Acute coronary syndrome secondary to high-grade thrombotic stenosis in the very proximal RCA in a dominant RCA vessel.   Normal left coronary circulation with a short left main, large LAD and left circumflex vessels.   Dominant RCA with 95% very proximal stenosis with thrombus burden with TIMI-3 flow.   Successful percutaneous coronary prevention with PTCA and ultimate stenting with a 3.0 x18 mm Resolute Onyx DES stent postdilated to 3.73 mm tapering to 3.68 mm with the percent stenosis being reduced to 0% and brisk TIMI-3 flow without evidence for dissection.   RECOMMENDATION: Consider initial triple drug therapy for 1 month with continuation of Plavix/Xarelto for at least 6 months in this patient on long-term anticoagulation therapy.   Diagnostic Dominance: Right  Intervention           Assessment and Plan:  1. Persistent [place] fibrillation ([place]   2. CAD in native artery   3. Essential hypertension   4. Mixed hyperlipidemia   5. Other fatigue     1. Persistent atrial fibrillation (HCC) Rate is controlled today with a rate of 49 and regular.  Continue Xarelto 20 mg daily.  Continue Tikosyn 250 mcg p.o. twice daily.  No bleeding  2. CAD in native artery Denies any anginal or exertional symptoms.  Continue Plavix 75 mg daily.  Continue nitroglycerin 0.5 mg as needed.  3. Essential hypertension Blood pressure elevated today at 1 140/74.  She states blood pressure is usually within normal limits at home.  Will monitor.  Continue losartan 100 mg daily.  Spironolactone 25 mg daily.  Torsemide 60 mg daily.  Potassium 20 mEq daily.  4. Mixed hyperlipidemia Continue atorvastatin 80 mg p.o. daily.  5. Other fatigue Fatigue is improved some since starting vitamin D due to vitamin D deficiency.  We had referred her to pulmonary for repeat  sleep study due to fatigue and feeling tired.  She had canceled the referral to pulmonary for repeat sleep study in the interim.  6.  Suspected sleep apnea Complaining of significant sleepiness in spite of  sleeping an adequate amount of time during the night.  She denies any snoring or apneic/hyper apneic periods.  She was referred to [facility] neurology for sleep consult but had since called and canceled that appointment.  She did not wish to pursue per nursing staff statement.  Medication Adjustments/Labs and Tests Ordered: Current medicines are reviewed at length with the patient today.  Concerns regarding medicines are outlined above.   Disposition: Follow-up with Dr. Harl Bowie or APP 6 months  Signed, Levell July, NP 09/04/2020 9:47 AM    [facility] [place], [place], [place] [address] Phone: [phone]; Fax: [phone]

## 2020-09-04 ENCOUNTER — Ambulatory Visit (INDEPENDENT_AMBULATORY_CARE_PROVIDER_SITE_OTHER): Payer: Medicare Other | Admitting: Family Medicine

## 2020-09-04 ENCOUNTER — Other Ambulatory Visit: Payer: Self-pay

## 2020-09-04 ENCOUNTER — Encounter: Payer: Self-pay | Admitting: Family Medicine

## 2020-09-04 VITALS — BP 140/74 | HR 49 | Ht 64.0 in | Wt 290.4 lb

## 2020-09-04 DIAGNOSIS — I2510 Atherosclerotic heart disease of native coronary artery without angina pectoris: Secondary | ICD-10-CM

## 2020-09-04 DIAGNOSIS — R5383 Other fatigue: Secondary | ICD-10-CM

## 2020-09-04 DIAGNOSIS — E782 Mixed hyperlipidemia: Secondary | ICD-10-CM | POA: Diagnosis not present

## 2020-09-04 DIAGNOSIS — I10 Essential (primary) hypertension: Secondary | ICD-10-CM | POA: Diagnosis not present

## 2020-09-04 DIAGNOSIS — I4819 Other persistent atrial fibrillation: Secondary | ICD-10-CM

## 2020-09-04 NOTE — Patient Instructions (Signed)
Medication Instructions:  Continue all current medications.   Labwork: none  Testing/Procedures: none  Follow-Up: 6 months   Any Other Special Instructions Will Be Listed Below (If Applicable).   If you need a refill on your cardiac medications before your next appointment, please call your pharmacy.  

## 2020-09-08 ENCOUNTER — Encounter (INDEPENDENT_AMBULATORY_CARE_PROVIDER_SITE_OTHER): Payer: Self-pay | Admitting: Vascular Surgery

## 2020-09-08 ENCOUNTER — Other Ambulatory Visit: Payer: Self-pay

## 2020-09-08 ENCOUNTER — Ambulatory Visit (INDEPENDENT_AMBULATORY_CARE_PROVIDER_SITE_OTHER): Payer: Medicare Other | Admitting: Vascular Surgery

## 2020-09-08 VITALS — BP 146/65 | HR 89 | Resp 16 | Ht 69.0 in | Wt 179.0 lb

## 2020-09-08 DIAGNOSIS — I10 Essential (primary) hypertension: Secondary | ICD-10-CM | POA: Diagnosis not present

## 2020-09-08 DIAGNOSIS — I4819 Other persistent atrial fibrillation: Secondary | ICD-10-CM | POA: Diagnosis not present

## 2020-09-08 DIAGNOSIS — I70229 Atherosclerosis of native arteries of extremities with rest pain, unspecified extremity: Secondary | ICD-10-CM | POA: Insufficient documentation

## 2020-09-08 DIAGNOSIS — E119 Type 2 diabetes mellitus without complications: Secondary | ICD-10-CM

## 2020-09-08 DIAGNOSIS — I82422 Acute embolism and thrombosis of left iliac vein: Secondary | ICD-10-CM | POA: Diagnosis not present

## 2020-09-08 DIAGNOSIS — I70222 Atherosclerosis of native arteries of extremities with rest pain, left leg: Secondary | ICD-10-CM

## 2020-09-08 NOTE — Patient Instructions (Signed)
Endovascular Therapy for Peripheral Vascular Disease Endovascular therapy is a procedure to widen a narrowed blood vessel and improve blood flow. It is used to treat peripheral vascular disease (PVD). PVDmay also be called peripheral artery disease (PAD) or poor circulation.  Endovascular means the procedure is done inside your artery, using a long, thin tube (catheter). The catheter is inserted into an incision in your leg and moved up your artery until it reaches the narrow part. A balloon or a small metal tube (stent) may be used to help widen the narrow artery and keep it open. Your health care provider may recommend endovascular therapy if lifestyle changes and medicines have not improved your PVD. In some cases, such as whenmore than one artery is affected, you may need more than one procedure. Tell a health care provider about: Any allergies you have. All medicines you are taking, including vitamins, herbs, eye drops, creams, and over-the-counter medicines. Any problems you or family members have had with anesthetic medicines. Any blood disorders you have. Any surgeries you have had. Any medical conditions you have. Whether you are pregnant or may be pregnant. What are the risks? Generally, this is a safe procedure. However, problems may occur, including: Infection. Bleeding. Allergic reactions to medicines, materials, or dyes. Damage to other structures or organs. This may include nerve damage or kidney problems. Blood clots, heart attack, or stroke. The stent moving out of place, becoming blocked, or not working. What happens before the procedure? Medicines Ask your health care provider about: Changing or stopping your regular medicines. This is especially important if you are taking diabetes medicines or blood thinners. Taking medicines such as aspirin and ibuprofen. These medicines can thin your blood. Do not take these medicines unless your health care provider tells you to take  them. Taking over-the-counter medicines, vitamins, herbs, and supplements. Tests You will have blood tests and a physical exam. You may have other tests, such as: Ankle-brachial index (ABI). This test compares blood pressure in your ankle and arm. This can indicate narrowing or blockage in your leg arteries. Doppler ultrasound. This test uses sound waves to check blood flow. CT scan. This test uses dye to check blood flow and blockages in your leg arteries. MRI. Electrocardiogram (ECG). This test checks the electrical patterns and rhythms of the heart. Surgery safety Ask your health care provider: How your surgery site will be marked. What steps will be taken to help prevent infection. These steps may include: Removing hair at the surgery site. Washing skin with a germ-killing soap. Taking antibiotic medicine. General instructions Do not use any products that contain nicotine or tobacco for at least 4 weeks before the procedure. These products include cigarettes, chewing tobacco, and vaping devices, such as e-cigarettes. If you need help quitting, ask your health care provider. Follow instructions from your health care provider about eating or drinking restrictions. Plan to have a responsible adult take you home from the hospital or clinic. Plan to have a responsible adult care for you for the time you are told after you leave the hospital or clinic. This is important. What happens during the procedure?  An IV will be inserted into one of your veins. You will be given one or more of the following: A medicine to help you relax (sedative). A medicine to numb the area (local anesthetic). A puncture or small incision will be made in your upper thigh area, in the femoral artery or the iliac artery. Rarely, a puncture or incision may be  made in the ankle area. A catheter will be inserted into the artery. It will be moved up the artery to reach the blocked or narrow part using a type of X-ray  (fluoroscopy). When the catheter is near the blocked or narrow part of the artery, contrast dye will be injected that makes the narrowing or blockage visible on the X-ray. Another catheter with a small, deflated balloon will be inserted into the artery. It will be moved up the artery to reach the blocked or narrow part. The small balloon will be inflated to widen the narrow part of the artery. The balloon will be deflated. A stent may be placed in the widened part of the artery to keep the artery open. The catheters will be removed. Your puncture or incision may be closed with a stitch (suture) or skin glue. Your puncture or incision may be covered with a bandage (dressing). The procedure may vary among health care providers and hospitals. What happens after the procedure? Your blood pressure, heart rate, breathing rate, and blood oxygen level will be monitored until you leave the hospital or clinic. You will need to stay in bed as directed. You will be encouraged to drink fluids to flush the dye out of your body. You will be given pain medicine as needed. If you were given a sedative during the procedure, it can affect you for several hours. Do not drive or operate machinery until your health care provider says that it is safe. Summary Endovascular therapy is a procedure to widen a narrowed blood vessel and improve blood flow. This procedure may be recommended if lifestyle changes and medicines are not enough to improve your peripheral vascular disease (PVD). After the procedure, you will need to stay in bed and you will be encouraged to drink fluids to flush the dye out of your body. This information is not intended to replace advice given to you by your health care provider. Make sure you discuss any questions you have with your healthcare provider. Document Revised: 08/02/2019 Document Reviewed: 08/02/2019 Elsevier Patient Education  [place].

## 2020-09-08 NOTE — Assessment & Plan Note (Signed)
Status post intervention for May Thurner syndrome 3 years ago.  Has significant postphlebitic symptoms which are reasonably stable and likely exacerbate her lower extremity pain.

## 2020-09-08 NOTE — H&P (View-Only) (Signed)
Patient ID: Amy Tran, female   DOB: 1955-05-09, 65 y.o.   MRN: [id number]  Chief Complaint  Patient presents with   New Patient (Initial Visit)    2nd opinion regarding leg swelling   Follow-up    ultrasound    HPI Amy Tran is a 65 y.o. female.  I am asked to see the patient by Dr. Willey Blade for evaluation of left lower extremity pain and swelling.  The patient has a previous history of May Thurner syndrome status post venous intervention about 3 years ago.  She has had chronic postphlebitic syndrome after this, and has significant chronic pain and swelling in the left leg.  More pressing issue for her has been the pain in the ball of her foot and her toes.  This is been going on for many months.  She cannot elevate her leg or even keep it flat due to the pain.  The pain is nearly constant and very severe.  No current open wounds or infections.  She has not had any previous arterial intervention to the lower extremities or surgery to the arteries of lower extremities to her knowledge.  She did have a noninvasive study performed in early March of this year that I have independently reviewed.  This was similar to findings of the study from last July as well.  This demonstrates normal triphasic waveforms with a right ABI of 0.99.  Her left ABI is markedly reduced with monophasic waveforms and an ABI of 0.51 and a digit pressure of only 20 on the left side.  Her right side is not really symptomatic.  She has no pain and minimal swelling on the right side.     Past Medical History:  Diagnosis Date   Arthritis    Asthma    Cancer ([place])    multiple skin cancers   Diabetes mellitus without complication ([place])    Diverticulosis    Fluid retention    GERD (gastroesophageal reflux disease)    High cholesterol    Hypertension    Incomplete rotator cuff tear    Myocardial infarction ([place])    Obesity    Persistent atrial fibrillation (HCC)    PONV (postoperative nausea and vomiting)     Wears glasses    Wears partial dentures    bottom    Past Surgical History:  Procedure Laterality Date   APPENDECTOMY     CARDIOVERSION N/A 06/03/2019   Procedure: CARDIOVERSION;  Surgeon: Herminio Commons, MD;  Location: [facility];  Service: Cardiovascular;  Laterality: N/A;   CARDIOVERSION N/A 07/13/2019   Procedure: CARDIOVERSION;  Surgeon: Geralynn Rile, MD;  Location: [place];  Service: Cardiovascular;  Laterality: N/A;   CARPAL TUNNEL RELEASE Bilateral 2001   bilateral   CERVICAL POLYPECTOMY  04/08/2017   Procedure: POLYPECTOMY;  Surgeon: [name], MD;  Location: [facility];  Service: Gynecology;;  Endometrial   CHOLECYSTECTOMY     COLONOSCOPY  2007   [id number] internal hemorrhoids. Diminutive rectal polyp at 10 cm, cold biopsied/removed. The remainder of the rectal mucosa appeared normal Swallow left-sided diverticula. Diminutive polyp at the splenic flexure cold biopsied/removed (adenomatous)   COLONOSCOPY N/A 12/28/2013   Procedure: COLONOSCOPY;  Surgeon: Daneil Dolin, MD;  Location: [facility];  Service: Endoscopy;  Laterality: N/A;  730 - moved to 8:30 - Ginger notified pt   CORONARY STENT INTERVENTION N/A 04/27/2019   Procedure: CORONARY STENT INTERVENTION;  Surgeon: Troy Sine, MD;  Location: [facility]  INVASIVE CV LAB;  Service: Cardiovascular;  Laterality: N/A;   DILATION AND CURETTAGE, DIAGNOSTIC / THERAPEUTIC  03/07/2017   ERCP  2002   GASTRIC BYPASS  2004   HYSTEROSCOPY WITH D & C N/A 04/08/2017   Procedure: DILATATION AND CURETTAGE /HYSTEROSCOPY;  Surgeon: [name], MD;  Location: [facility];  Service: Gynecology;  Laterality: N/A;   INTRAVASCULAR ULTRASOUND/IVUS N/A 09/12/2017   Procedure: INTRAVASCULAR ULTRASOUND/IVUS;  Surgeon: Elam Dutch, MD;  Location: [place] CV LAB;  Service: Cardiovascular;  Laterality: N/A;   LEFT HEART CATH AND CORONARY ANGIOGRAPHY N/A 04/27/2019   Procedure: LEFT HEART CATH AND CORONARY ANGIOGRAPHY;  Surgeon:  Troy Sine, MD;  Location: [place] CV LAB;  Service: Cardiovascular;  Laterality: N/A;   NECK SURGERY  1998   fusion   PERCUTANEOUS VENOUS THROMBECTOMY,LYSIS WITH INTRAVASCULAR ULTRASOUND (IVUS) Left 09/13/2017   Procedure: REMOVAL OF LYSIS CATHETER AND INTRAVASCULAR ULTRASOUND (IVUS) LEFT ILIAC VEIN;  Surgeon: Elam Dutch, MD;  Location: [place];  Service: Vascular;  Laterality: Left;   PERIPHERAL VASCULAR INTERVENTION  09/15/2017   Procedure: PERIPHERAL VASCULAR INTERVENTION;  Surgeon: Waynetta Sandy, MD;  Location: [place] CV [facility];  Service: Cardiovascular;;  VEINOUS/STENT   PERIPHERAL VASCULAR THROMBECTOMY Left 09/12/2017   Procedure: PERIPHERAL VASCULAR THROMBECTOMY;  Surgeon: Elam Dutch, MD;  Location: [place] CV [facility];  Service: Cardiovascular;  Laterality: Left;   PERIPHERAL VASCULAR THROMBECTOMY Left 09/15/2017   Procedure: PERIPHERAL VASCULAR THROMBECTOMY;  Surgeon: Waynetta Sandy, MD;  Location: [place] CV LAB;  Service: Cardiovascular;  Laterality: Left;  IVC TO LT FEM/POP VEIN   SHOULDER ARTHROSCOPY WITH SUBACROMIAL DECOMPRESSION Left 06/23/2013   Procedure: LEFT SHOULDER ARTHROSCOPY WITH DEBRIDEMENT ROTATOR CUFF AND LABRUM;  Surgeon: Lorn Junes, MD;  Location: [place];  Service: Orthopedics;  Laterality: Left;   SHOULDER SURGERY  1999   left     Family History  Problem Relation Age of Onset   Diabetes Father    Hypertension Father    Parkinson's disease Father    Cancer Father        not sure what kind   Hypertension Mother    Heart disease Mother    Drug abuse Sister    Mesothelioma Brother    Cancer Daughter        non-hodgkins lmphoma   Hypertension Brother    Diabetes Brother    Colon cancer Neg Hx    Gastric cancer Neg Hx    Esophageal cancer Neg Hx       Social History   Tobacco Use   Smoking status: Every Day    Packs/day: 0.30    Years: 29.00    Pack years: 8.70    Types: Cigarettes     Start date: 04/09/1970   Smokeless tobacco: Never   Tobacco comments:    09/04/20 - states she is down to 9 ciggs / day   Vaping Use   Vaping Use: Never used  Substance Use Topics   Alcohol use: Not Currently   Drug use: No     Allergies  Allergen Reactions   Penicillins Hives and Rash    Has patient had a PCN reaction causing immediate rash, facial/tongue/throat swelling, SOB or lightheadedness with hypotension: Yes Has patient had a PCN reaction causing severe rash involving mucus membranes or skin necrosis: No Has patient had a PCN reaction that required hospitalization No Has patient had a PCN reaction occurring within the last 10 years: No  If all of the above answers are "NO", then may proceed with Cephalosporin use.     Current Outpatient Medications  Medication Sig Dispense Refill   acetaminophen (TYLENOL) 500 MG tablet Take 500 mg by mouth every 6 (six) hours as needed for mild pain or headache.     albuterol (VENTOLIN HFA) 108 (90 Base) MCG/ACT inhaler Inhale 2 puffs into the lungs every 6 (six) hours as needed for wheezing or shortness of breath.     atorvastatin (LIPITOR) 80 MG tablet TAKE 1 TABLET (80 MG TOTAL) BY MOUTH DAILY AT 6 PM. 90 tablet 2   BD PEN NEEDLE NANO U/F 32G X 4 MM MISC USE FOR VICTOZA INJECTIONS     cetirizine (ZYRTEC) 10 MG tablet Take 10 mg by mouth at bedtime.     clopidogrel (PLAVIX) 75 MG tablet TAKE 1 TABLET (75 MG TOTAL) BY MOUTH DAILY WITH BREAKFAST. 90 tablet 3   dofetilide (TIKOSYN) 250 MCG capsule Take 1 capsule (250 mcg total) by mouth 2 (two) times daily. 60 capsule 6   Empagliflozin (JARDIANCE PO) Take by mouth daily.     fluticasone (FLONASE) 50 MCG/ACT nasal spray Place 2 sprays into both nostrils daily as needed for rhinitis (congestion/ dryness).      losartan (COZAAR) 100 MG tablet Take 100 mg by mouth daily with breakfast.      nitroGLYCERIN (NITROSTAT) 0.4 MG SL tablet Place 1 tablet (0.4 mg total) under the tongue every 5 (five)  minutes as needed for chest pain. 30 tablet 0   potassium chloride SA (KLOR-CON) 20 MEQ tablet Take 1 tablet (20 mEq total) by mouth 2 (two) times daily.     spironolactone (ALDACTONE) 25 MG tablet Take 25 mg by mouth daily with breakfast.      torsemide (DEMADEX) 20 MG tablet Take 60 mg by mouth daily after breakfast.      VICTOZA 18 MG/3ML SOPN Inject 1.8 mg into the skin at bedtime.      VITAMIN D PO Take 2 capsules by mouth daily.     XARELTO 20 MG TABS tablet TAKE 1 TABLET BY MOUTH EVERYDAY AT BEDTIME 30 tablet 2   Cyanocobalamin 1500 MCG TBDP Take 1,500 mcg by mouth daily with breakfast. Vitamin B12 (Patient not taking: No sig reported)     No current facility-administered medications for this visit.      REVIEW OF SYSTEMS (Negative unless checked)  Constitutional: '[]'$ Weight loss  '[]'$ Fever  '[]'$ Chills Cardiac: '[]'$ Chest pain   '[]'$ Chest pressure   '[]'$ Palpitations   '[]'$ Shortness of breath when laying flat   '[]'$ Shortness of breath at rest   '[]'$ Shortness of breath with exertion. Vascular:  '[x]'$ Pain in legs with walking   '[]'$ Pain in legs at rest   '[]'$ Pain in legs when laying flat   '[]'$ Claudication   '[]'$ Pain in feet when walking  '[x]'$ Pain in feet at rest  '[x]'$ Pain in feet when laying flat   '[x]'$ History of DVT   '[]'$ Phlebitis   '[x]'$ Swelling in legs   '[]'$ Varicose veins   '[]'$ Non-healing ulcers Pulmonary:   '[]'$ Uses home oxygen   '[]'$ Productive cough   '[]'$ Hemoptysis   '[]'$ Wheeze  '[]'$ COPD   '[]'$ Asthma Neurologic:  '[]'$ Dizziness  '[]'$ Blackouts   '[]'$ Seizures   '[]'$ History of stroke   '[]'$ History of TIA  '[]'$ Aphasia   '[]'$ Temporary blindness   '[]'$ Dysphagia   '[]'$ Weakness or numbness in arms   '[]'$ Weakness or numbness in legs Musculoskeletal:  '[x]'$ Arthritis   '[]'$ Joint swelling   '[x]'$ Joint pain   '[]'$ Low back pain Hematologic:  '[]'$   Easy bruising  '[]'$ Easy bleeding   '[]'$ Hypercoagulable state   '[]'$ Anemic  '[]'$ Hepatitis Gastrointestinal:  '[]'$ Blood in stool   '[]'$ Vomiting blood  '[]'$ Gastroesophageal reflux/heartburn   '[]'$ Abdominal pain Genitourinary:  '[]'$ Chronic kidney disease    '[]'$ Difficult urination  '[]'$ Frequent urination  '[]'$ Burning with urination   '[]'$ Hematuria Skin:  '[]'$ Rashes   '[]'$ Ulcers   '[]'$ Wounds Psychological:  '[]'$ History of anxiety   '[]'$  History of major depression.    Physical Exam BP (!) 146/65 (BP Location: Right Arm)   Pulse 89   Resp 16   Ht '5\' 9"'$  (1.753 m)   Wt 179 lb (81.2 kg)   BMI 26.43 kg/m  Gen:  WD/WN, NAD.  Obese Head: [place]/AT, No temporalis wasting.  Ear/Nose/Throat: Hearing grossly intact, nares w/o erythema or drainage, oropharynx w/o Erythema/Exudate Eyes: Conjunctiva clear, sclera non-icteric  Neck: trachea midline.  No JVD.  Pulmonary:  Good air movement, respirations not labored, no use of accessory muscles  Cardiac: Irregular Vascular:  Vessel Right Left  Radial Palpable Palpable                          DP 2+ Not palpable  PT 1+ Not palpable   Gastrointestinal:. No masses, surgical incisions, or scars. Musculoskeletal: M/S 5/5 throughout.  Left foot demonstrates rubor with dependency.  Capillary refill is also somewhat sluggish on the left.  No deformity or atrophy.  Trace right lower extremity edema, 2+ left lower extremity edema. Neurologic: Sensation grossly intact in extremities.  Symmetrical.  Speech is fluent. Motor exam as listed above. Psychiatric: Judgment intact, Mood & affect appropriate for pt's clinical situation. Dermatologic: No rashes or ulcers noted.  No cellulitis or open wounds.    Radiology No results found.  Labs No results found for this or any previous visit (from the past 2160 hour(s)).  Assessment/Plan:  Atherosclerosis of native arteries of extremity with rest pain [facility]) She did have a noninvasive study performed in early March of this year that I have independently reviewed.  This was similar to findings of the study from last July as well.  This demonstrates normal triphasic waveforms with a right ABI of 0.99.  Her left ABI is markedly reduced with monophasic waveforms and an ABI of 0.51  and a digit pressure of only 20 on the left side.   The patient describes symptoms which are relatively classic for left foot rest pain.  This is the leg she also had the extensive DVT and does have postphlebitic syndrome on the left side as well, but these are separate and distinct problems.  This is also worrisome because if she develops an ulceration on that side is going to be difficult to heal.  This is a critical and limb threatening situation.  The patient asked me why it was not dealt with before, and I do not have an answer to that since this is the first time that I am seeing her.  I do believe she needs an angiogram with hope for revascularization of the left lower extremity based off her last 2 noninvasive studies going back almost 1 year.  I have discussed the risks and benefits of the procedure.  I discussed that without revascularization, limb loss is a likely outcome.  I discussed that endovascular revascularization will be performed if technically feasible and appropriate, but open surgical therapy may be required as well.  She is eager to get this done and we will get it scheduled for the near future.  Iliac vein thrombosis, left (HCC) Status post intervention for May Thurner syndrome 3 years ago.  Has significant postphlebitic symptoms which are reasonably stable and likely exacerbate her lower extremity pain.  Persistent atrial fibrillation (HCC) On anticoagulation, rate controlled  Hypertension blood pressure control important in reducing the progression of atherosclerotic disease. On appropriate oral medications.   Diabetes mellitus without complication blood glucose control important in reducing the progression of atherosclerotic disease. Also, involved in wound healing. On appropriate medications.      Leotis Pain 09/08/2020, 9:49 AM   This note was created with Dragon medical transcription system.  Any errors from dictation are unintentional.

## 2020-09-08 NOTE — Assessment & Plan Note (Signed)
She did have a noninvasive study performed in early March of this year that I have independently reviewed.  This was similar to findings of the study from last July as well.  This demonstrates normal triphasic waveforms with a right ABI of 0.99.  Her left ABI is markedly reduced with monophasic waveforms and an ABI of 0.51 and a digit pressure of only 20 on the left side.   The patient describes symptoms which are relatively classic for left foot rest pain.  This is the leg she also had the extensive DVT and does have postphlebitic syndrome on the left side as well, but these are separate and distinct problems.  This is also worrisome because if she develops an ulceration on that side is going to be difficult to heal.  This is a critical and limb threatening situation.  The patient asked me why it was not dealt with before, and I do not have an answer to that since this is the first time that I am seeing her.  I do believe she needs an angiogram with hope for revascularization of the left lower extremity based off her last 2 noninvasive studies going back almost 1 year.  I have discussed the risks and benefits of the procedure.  I discussed that without revascularization, limb loss is a likely outcome.  I discussed that endovascular revascularization will be performed if technically feasible and appropriate, but open surgical therapy may be required as well.  She is eager to get this done and we will get it scheduled for the near future.

## 2020-09-08 NOTE — Assessment & Plan Note (Signed)
blood pressure control important in reducing the progression of atherosclerotic disease. On appropriate oral medications.  

## 2020-09-08 NOTE — Assessment & Plan Note (Signed)
blood glucose control important in reducing the progression of atherosclerotic disease. Also, involved in wound healing. On appropriate medications.  

## 2020-09-08 NOTE — Assessment & Plan Note (Signed)
On anticoagulation, rate controlled

## 2020-09-08 NOTE — Progress Notes (Signed)
Patient ID: Amy Tran, female   DOB: 05/13/1955, 65 y.o.   MRN: [id number]  Chief Complaint  Patient presents with   New Patient (Initial Visit)    2nd opinion regarding leg swelling   Follow-up    ultrasound    HPI Amy Tran is a 65 y.o. female.  I am asked to see the patient by Dr. Willey Blade for evaluation of left lower extremity pain and swelling.  The patient has a previous history of May Thurner syndrome status post venous intervention about 3 years ago.  She has had chronic postphlebitic syndrome after this, and has significant chronic pain and swelling in the left leg.  More pressing issue for her has been the pain in the ball of her foot and her toes.  This is been going on for many months.  She cannot elevate her leg or even keep it flat due to the pain.  The pain is nearly constant and very severe.  No current open wounds or infections.  She has not had any previous arterial intervention to the lower extremities or surgery to the arteries of lower extremities to her knowledge.  She did have a noninvasive study performed in early March of this year that I have independently reviewed.  This was similar to findings of the study from last July as well.  This demonstrates normal triphasic waveforms with a right ABI of 0.99.  Her left ABI is markedly reduced with monophasic waveforms and an ABI of 0.51 and a digit pressure of only 20 on the left side.  Her right side is not really symptomatic.  She has no pain and minimal swelling on the right side.     Past Medical History:  Diagnosis Date   Arthritis    Asthma    Cancer ([place])    multiple skin cancers   Diabetes mellitus without complication ([place])    Diverticulosis    Fluid retention    GERD (gastroesophageal reflux disease)    High cholesterol    Hypertension    Incomplete rotator cuff tear    Myocardial infarction ([place])    Obesity    Persistent atrial fibrillation (HCC)    PONV (postoperative nausea and vomiting)     Wears glasses    Wears partial dentures    bottom    Past Surgical History:  Procedure Laterality Date   APPENDECTOMY     CARDIOVERSION N/A 06/03/2019   Procedure: CARDIOVERSION;  Surgeon: Herminio Commons, MD;  Location: [facility];  Service: Cardiovascular;  Laterality: N/A;   CARDIOVERSION N/A 07/13/2019   Procedure: CARDIOVERSION;  Surgeon: Geralynn Rile, MD;  Location: [place];  Service: Cardiovascular;  Laterality: N/A;   CARPAL TUNNEL RELEASE Bilateral 2001   bilateral   CERVICAL POLYPECTOMY  04/08/2017   Procedure: POLYPECTOMY;  Surgeon: [name], MD;  Location: [facility];  Service: Gynecology;;  Endometrial   CHOLECYSTECTOMY     COLONOSCOPY  2007   [id number] internal hemorrhoids. Diminutive rectal polyp at 10 cm, cold biopsied/removed. The remainder of the rectal mucosa appeared normal Swallow left-sided diverticula. Diminutive polyp at the splenic flexure cold biopsied/removed (adenomatous)   COLONOSCOPY N/A 12/28/2013   Procedure: COLONOSCOPY;  Surgeon: Daneil Dolin, MD;  [facility];  Service: Endoscopy;  Laterality: N/A;  730 - moved to 8:30 - Ginger notified pt   CORONARY STENT INTERVENTION N/A 04/27/2019   Procedure: CORONARY STENT INTERVENTION;  Surgeon: Troy Sine, MD;  Location: [facility]  INVASIVE CV LAB;  Service: Cardiovascular;  Laterality: N/A;   DILATION AND CURETTAGE, DIAGNOSTIC / THERAPEUTIC  03/07/2017   ERCP  2002   GASTRIC BYPASS  2004   HYSTEROSCOPY WITH D & C N/A 04/08/2017   Procedure: DILATATION AND CURETTAGE /HYSTEROSCOPY;  Surgeon: [name], MD;  Location: [facility];  Service: Gynecology;  Laterality: N/A;   INTRAVASCULAR ULTRASOUND/IVUS N/A 09/12/2017   Procedure: INTRAVASCULAR ULTRASOUND/IVUS;  Surgeon: Elam Dutch, MD;  Location: [place] CV [facility];  Service: Cardiovascular;  Laterality: N/A;   LEFT HEART CATH AND CORONARY ANGIOGRAPHY N/A 04/27/2019   Procedure: LEFT HEART CATH AND CORONARY ANGIOGRAPHY;  Surgeon:  Troy Sine, MD;  Location: [place] [facility];  Service: Cardiovascular;  Laterality: N/A;   NECK SURGERY  1998   fusion   PERCUTANEOUS VENOUS THROMBECTOMY,LYSIS WITH INTRAVASCULAR ULTRASOUND (IVUS) Left 09/13/2017   Procedure: REMOVAL OF LYSIS CATHETER AND INTRAVASCULAR ULTRASOUND (IVUS) LEFT ILIAC VEIN;  Surgeon: Elam Dutch, MD;  Location: [place];  Service: Vascular;  Laterality: Left;   PERIPHERAL VASCULAR INTERVENTION  09/15/2017   Procedure: PERIPHERAL VASCULAR INTERVENTION;  Surgeon: Waynetta Sandy, MD;  Location: [place] [facility];  Service: Cardiovascular;;  VEINOUS/STENT   PERIPHERAL VASCULAR THROMBECTOMY Left 09/12/2017   Procedure: PERIPHERAL VASCULAR THROMBECTOMY;  Surgeon: Elam Dutch, MD;  Location: [place] [facility];  Service: Cardiovascular;  Laterality: Left;   PERIPHERAL VASCULAR THROMBECTOMY Left 09/15/2017   Procedure: PERIPHERAL VASCULAR THROMBECTOMY;  Surgeon: Waynetta Sandy, MD;  Location: [place] [facility];  Service: Cardiovascular;  Laterality: Left;  IVC TO LT FEM/POP VEIN   SHOULDER ARTHROSCOPY WITH SUBACROMIAL DECOMPRESSION Left 06/23/2013   Procedure: LEFT SHOULDER ARTHROSCOPY WITH DEBRIDEMENT ROTATOR CUFF AND LABRUM;  Surgeon: Lorn Junes, MD;  Location: [place];  Service: Orthopedics;  Laterality: Left;   SHOULDER SURGERY  1999   left     Family History  Problem Relation Age of Onset   Diabetes Father    Hypertension Father    Parkinson's disease Father    Cancer Father        not sure what kind   Hypertension Mother    Heart disease Mother    Drug abuse Sister    Mesothelioma Brother    Cancer Daughter        non-hodgkins lmphoma   Hypertension Brother    Diabetes Brother    Colon cancer Neg Hx    Gastric cancer Neg Hx    Esophageal cancer Neg Hx       Social History   Tobacco Use   Smoking status: Every Day    Packs/day: 0.30    Years: 29.00    Pack years: 8.70    Types: Cigarettes     Start date: 04/09/1970   Smokeless tobacco: Never   Tobacco comments:    09/04/20 - states she is down to 9 ciggs / day   Vaping Use   Vaping Use: Never used  Substance Use Topics   Alcohol use: Not Currently   Drug use: No     Allergies  Allergen Reactions   Penicillins Hives and Rash    Has patient had a PCN reaction causing immediate rash, facial/tongue/throat swelling, SOB or lightheadedness with hypotension: Yes Has patient had a PCN reaction causing severe rash involving mucus membranes or skin necrosis: No Has patient had a PCN reaction that required hospitalization No Has patient had a PCN reaction occurring within the last 10 years: No  If all of the above answers are "NO", then may proceed with Cephalosporin use.     Current Outpatient Medications  Medication Sig Dispense Refill   acetaminophen (TYLENOL) 500 MG tablet Take 500 mg by mouth every 6 (six) hours as needed for mild pain or headache.     albuterol (VENTOLIN HFA) 108 (90 Base) MCG/ACT inhaler Inhale 2 puffs into the lungs every 6 (six) hours as needed for wheezing or shortness of breath.     atorvastatin (LIPITOR) 80 MG tablet TAKE 1 TABLET (80 MG TOTAL) BY MOUTH DAILY AT 6 PM. 90 tablet 2   BD PEN NEEDLE NANO U/F 32G X 4 MM MISC USE FOR VICTOZA INJECTIONS     cetirizine (ZYRTEC) 10 MG tablet Take 10 mg by mouth at bedtime.     clopidogrel (PLAVIX) 75 MG tablet TAKE 1 TABLET (75 MG TOTAL) BY MOUTH DAILY WITH BREAKFAST. 90 tablet 3   dofetilide (TIKOSYN) 250 MCG capsule Take 1 capsule (250 mcg total) by mouth 2 (two) times daily. 60 capsule 6   Empagliflozin (JARDIANCE PO) Take by mouth daily.     fluticasone (FLONASE) 50 MCG/ACT nasal spray Place 2 sprays into both nostrils daily as needed for rhinitis (congestion/ dryness).      losartan (COZAAR) 100 MG tablet Take 100 mg by mouth daily with breakfast.      nitroGLYCERIN (NITROSTAT) 0.4 MG SL tablet Place 1 tablet (0.4 mg total) under the tongue every 5 (five)  minutes as needed for chest pain. 30 tablet 0   potassium chloride SA (KLOR-CON) 20 MEQ tablet Take 1 tablet (20 mEq total) by mouth 2 (two) times daily.     spironolactone (ALDACTONE) 25 MG tablet Take 25 mg by mouth daily with breakfast.      torsemide (DEMADEX) 20 MG tablet Take 60 mg by mouth daily after breakfast.      VICTOZA 18 MG/3ML SOPN Inject 1.8 mg into the skin at bedtime.      VITAMIN D PO Take 2 capsules by mouth daily.     XARELTO 20 MG TABS tablet TAKE 1 TABLET BY MOUTH EVERYDAY AT BEDTIME 30 tablet 2   Cyanocobalamin 1500 MCG TBDP Take 1,500 mcg by mouth daily with breakfast. Vitamin B12 (Patient not taking: No sig reported)     No current facility-administered medications for this visit.      REVIEW OF SYSTEMS (Negative unless checked)  Constitutional: '[]'$ Weight loss  '[]'$ Fever  '[]'$ Chills Cardiac: '[]'$ Chest pain   '[]'$ Chest pressure   '[]'$ Palpitations   '[]'$ Shortness of breath when laying flat   '[]'$ Shortness of breath at rest   '[]'$ Shortness of breath with exertion. Vascular:  '[x]'$ Pain in legs with walking   '[]'$ Pain in legs at rest   '[]'$ Pain in legs when laying flat   '[]'$ Claudication   '[]'$ Pain in feet when walking  '[x]'$ Pain in feet at rest  '[x]'$ Pain in feet when laying flat   '[x]'$ History of DVT   '[]'$ Phlebitis   '[x]'$ Swelling in legs   '[]'$ Varicose veins   '[]'$ Non-healing ulcers Pulmonary:   '[]'$ Uses home oxygen   '[]'$ Productive cough   '[]'$ Hemoptysis   '[]'$ Wheeze  '[]'$ COPD   '[]'$ Asthma Neurologic:  '[]'$ Dizziness  '[]'$ Blackouts   '[]'$ Seizures   '[]'$ History of stroke   '[]'$ History of TIA  '[]'$ Aphasia   '[]'$ Temporary blindness   '[]'$ Dysphagia   '[]'$ Weakness or numbness in arms   '[]'$ Weakness or numbness in legs Musculoskeletal:  '[x]'$ Arthritis   '[]'$ Joint swelling   '[x]'$ Joint pain   '[]'$ Low back pain Hematologic:  '[]'$   Easy bruising  '[]'$ Easy bleeding   '[]'$ Hypercoagulable state   '[]'$ Anemic  '[]'$ Hepatitis Gastrointestinal:  '[]'$ Blood in stool   '[]'$ Vomiting blood  '[]'$ Gastroesophageal reflux/heartburn   '[]'$ Abdominal pain Genitourinary:  '[]'$ Chronic kidney disease    '[]'$ Difficult urination  '[]'$ Frequent urination  '[]'$ Burning with urination   '[]'$ Hematuria Skin:  '[]'$ Rashes   '[]'$ Ulcers   '[]'$ Wounds Psychological:  '[]'$ History of anxiety   '[]'$  History of major depression.    Physical Exam BP (!) 146/65 (BP Location: Right Arm)   Pulse 89   Resp 16   Ht '5\' 9"'$  (1.753 m)   Wt 179 lb (81.2 kg)   BMI 26.43 kg/m  Gen:  WD/WN, NAD.  Obese Head: [place]/AT, No temporalis wasting.  Ear/Nose/Throat: Hearing grossly intact, nares w/o erythema or drainage, oropharynx w/o Erythema/Exudate Eyes: Conjunctiva clear, sclera non-icteric  Neck: trachea midline.  No JVD.  Pulmonary:  Good air movement, respirations not labored, no use of accessory muscles  Cardiac: Irregular Vascular:  Vessel Right Left  Radial Palpable Palpable                          DP 2+ Not palpable  PT 1+ Not palpable   Gastrointestinal:. No masses, surgical incisions, or scars. Musculoskeletal: M/S 5/5 throughout.  Left foot demonstrates rubor with dependency.  Capillary refill is also somewhat sluggish on the left.  No deformity or atrophy.  Trace right lower extremity edema, 2+ left lower extremity edema. Neurologic: Sensation grossly intact in extremities.  Symmetrical.  Speech is fluent. Motor exam as listed above. Psychiatric: Judgment intact, Mood & affect appropriate for pt's clinical situation. Dermatologic: No rashes or ulcers noted.  No cellulitis or open wounds.    Radiology No results found.  Labs No results found for this or any previous visit (from the past 2160 hour(s)).  Assessment/Plan:  Atherosclerosis of native arteries of extremity with rest pain [facility]) She did have a noninvasive study performed in early March of this year that I have independently reviewed.  This was similar to findings of the study from last July as well.  This demonstrates normal triphasic waveforms with a right ABI of 0.99.  Her left ABI is markedly reduced with monophasic waveforms and an ABI of 0.51  and a digit pressure of only 20 on the left side.   The patient describes symptoms which are relatively classic for left foot rest pain.  This is the leg she also had the extensive DVT and does have postphlebitic syndrome on the left side as well, but these are separate and distinct problems.  This is also worrisome because if she develops an ulceration on that side is going to be difficult to heal.  This is a critical and limb threatening situation.  The patient asked me why it was not dealt with before, and I do not have an answer to that since this is the first time that I am seeing her.  I do believe she needs an angiogram with hope for revascularization of the left lower extremity based off her last 2 noninvasive studies going back almost 1 year.  I have discussed the risks and benefits of the procedure.  I discussed that without revascularization, limb loss is a likely outcome.  I discussed that endovascular revascularization will be performed if technically feasible and appropriate, but open surgical therapy may be required as well.  She is eager to get this done and we will get it scheduled for the near future.  Iliac vein thrombosis, left (HCC) Status post intervention for May Thurner syndrome 3 years ago.  Has significant postphlebitic symptoms which are reasonably stable and likely exacerbate her lower extremity pain.  Persistent atrial fibrillation (HCC) On anticoagulation, rate controlled  Hypertension blood pressure control important in reducing the progression of atherosclerotic disease. On appropriate oral medications.   Diabetes mellitus without complication blood glucose control important in reducing the progression of atherosclerotic disease. Also, involved in wound healing. On appropriate medications.      Leotis Pain 09/08/2020, 9:49 AM   This note was created with Dragon medical transcription system.  Any errors from dictation are unintentional.

## 2020-09-15 ENCOUNTER — Telehealth (INDEPENDENT_AMBULATORY_CARE_PROVIDER_SITE_OTHER): Payer: Self-pay | Admitting: Vascular Surgery

## 2020-09-15 NOTE — Telephone Encounter (Signed)
Called to check the status of her surgery she is waiting to be scheduled. I informed her that the surgery scheduler is out and the nurses are doing the best they can to catch up and they will call her as soon as they can.   Patient was last seen 7/29 as a new patient (JD)  Patient has upcoming ultrasounds at [facility] not sure if she will continue to see them or [place].

## 2020-09-15 NOTE — Telephone Encounter (Signed)
Return a call to the patient was not able to leave vocicemail

## 2020-09-18 ENCOUNTER — Other Ambulatory Visit: Payer: Self-pay | Admitting: Cardiology

## 2020-09-18 DIAGNOSIS — H40003 Preglaucoma, unspecified, bilateral: Secondary | ICD-10-CM | POA: Diagnosis not present

## 2020-09-18 DIAGNOSIS — H2513 Age-related nuclear cataract, bilateral: Secondary | ICD-10-CM | POA: Diagnosis not present

## 2020-09-18 DIAGNOSIS — H25013 Cortical age-related cataract, bilateral: Secondary | ICD-10-CM | POA: Diagnosis not present

## 2020-09-18 DIAGNOSIS — H40013 Open angle with borderline findings, low risk, bilateral: Secondary | ICD-10-CM | POA: Diagnosis not present

## 2020-09-18 DIAGNOSIS — E1136 Type 2 diabetes mellitus with diabetic cataract: Secondary | ICD-10-CM | POA: Diagnosis not present

## 2020-09-27 ENCOUNTER — Encounter (INDEPENDENT_AMBULATORY_CARE_PROVIDER_SITE_OTHER): Payer: Self-pay

## 2020-10-02 ENCOUNTER — Other Ambulatory Visit (INDEPENDENT_AMBULATORY_CARE_PROVIDER_SITE_OTHER): Payer: Self-pay | Admitting: Nurse Practitioner

## 2020-10-02 ENCOUNTER — Ambulatory Visit
Admission: RE | Admit: 2020-10-02 | Discharge: 2020-10-02 | Disposition: A | Discharge status: Home / Self Care | Payer: Medicare Other | Attending: Vascular Surgery | Admitting: Vascular Surgery

## 2020-10-02 ENCOUNTER — Encounter: Payer: Self-pay | Admitting: Vascular Surgery

## 2020-10-02 ENCOUNTER — Other Ambulatory Visit: Payer: Self-pay

## 2020-10-02 ENCOUNTER — Encounter
Admission: RE | Disposition: A | Discharge status: Home / Self Care | Payer: Self-pay | Source: Home / Self Care | Attending: Vascular Surgery

## 2020-10-02 DIAGNOSIS — I252 Old myocardial infarction: Secondary | ICD-10-CM | POA: Insufficient documentation

## 2020-10-02 DIAGNOSIS — Z86010 Personal history of colonic polyps: Secondary | ICD-10-CM | POA: Diagnosis not present

## 2020-10-02 DIAGNOSIS — Z8249 Family history of ischemic heart disease and other diseases of the circulatory system: Secondary | ICD-10-CM | POA: Insufficient documentation

## 2020-10-02 DIAGNOSIS — Z6826 Body mass index (BMI) 26.0-26.9, adult: Secondary | ICD-10-CM | POA: Insufficient documentation

## 2020-10-02 DIAGNOSIS — E669 Obesity, unspecified: Secondary | ICD-10-CM | POA: Diagnosis not present

## 2020-10-02 DIAGNOSIS — Z955 Presence of coronary angioplasty implant and graft: Secondary | ICD-10-CM | POA: Diagnosis not present

## 2020-10-02 DIAGNOSIS — I82522 Chronic embolism and thrombosis of left iliac vein: Secondary | ICD-10-CM | POA: Diagnosis not present

## 2020-10-02 DIAGNOSIS — E119 Type 2 diabetes mellitus without complications: Secondary | ICD-10-CM | POA: Insufficient documentation

## 2020-10-02 DIAGNOSIS — Z9049 Acquired absence of other specified parts of digestive tract: Secondary | ICD-10-CM | POA: Insufficient documentation

## 2020-10-02 DIAGNOSIS — Z79899 Other long term (current) drug therapy: Secondary | ICD-10-CM | POA: Diagnosis not present

## 2020-10-02 DIAGNOSIS — F17210 Nicotine dependence, cigarettes, uncomplicated: Secondary | ICD-10-CM | POA: Diagnosis not present

## 2020-10-02 DIAGNOSIS — E7800 Pure hypercholesterolemia, unspecified: Secondary | ICD-10-CM | POA: Insufficient documentation

## 2020-10-02 DIAGNOSIS — Z880 Allergy status to penicillin: Secondary | ICD-10-CM | POA: Insufficient documentation

## 2020-10-02 DIAGNOSIS — Z8719 Personal history of other diseases of the digestive system: Secondary | ICD-10-CM | POA: Diagnosis not present

## 2020-10-02 DIAGNOSIS — I10 Essential (primary) hypertension: Secondary | ICD-10-CM | POA: Insufficient documentation

## 2020-10-02 DIAGNOSIS — Z809 Family history of malignant neoplasm, unspecified: Secondary | ICD-10-CM | POA: Diagnosis not present

## 2020-10-02 DIAGNOSIS — Z9884 Bariatric surgery status: Secondary | ICD-10-CM | POA: Insufficient documentation

## 2020-10-02 DIAGNOSIS — Z833 Family history of diabetes mellitus: Secondary | ICD-10-CM | POA: Diagnosis not present

## 2020-10-02 DIAGNOSIS — Z7901 Long term (current) use of anticoagulants: Secondary | ICD-10-CM | POA: Insufficient documentation

## 2020-10-02 DIAGNOSIS — Z7902 Long term (current) use of antithrombotics/antiplatelets: Secondary | ICD-10-CM | POA: Insufficient documentation

## 2020-10-02 DIAGNOSIS — I4819 Other persistent atrial fibrillation: Secondary | ICD-10-CM | POA: Insufficient documentation

## 2020-10-02 DIAGNOSIS — Z85828 Personal history of other malignant neoplasm of skin: Secondary | ICD-10-CM | POA: Diagnosis not present

## 2020-10-02 DIAGNOSIS — I70229 Atherosclerosis of native arteries of extremities with rest pain, unspecified extremity: Secondary | ICD-10-CM

## 2020-10-02 DIAGNOSIS — Z807 Family history of other malignant neoplasms of lymphoid, hematopoietic and related tissues: Secondary | ICD-10-CM | POA: Insufficient documentation

## 2020-10-02 DIAGNOSIS — I70222 Atherosclerosis of native arteries of extremities with rest pain, left leg: Secondary | ICD-10-CM | POA: Diagnosis not present

## 2020-10-02 HISTORY — PX: LOWER EXTREMITY ANGIOGRAPHY: CATH118251

## 2020-10-02 LAB — BUN: BUN: 13 mg/dL (ref 8–23)

## 2020-10-02 LAB — GLUCOSE, CAPILLARY
Glucose-Capillary: 149 mg/dL — ABNORMAL HIGH (ref 70–99)
Glucose-Capillary: 137 mg/dL — ABNORMAL HIGH (ref 70–99)

## 2020-10-02 LAB — CREATININE, SERUM
Creatinine, Ser: 0.74 mg/dL (ref 0.44–1.00)
GFR, Estimated: >60 mL/min (ref >60)

## 2020-10-02 SURGERY — LOWER EXTREMITY ANGIOGRAPHY
Anesthesia: Moderate Sedation | Site: Leg Lower | Laterality: Left

## 2020-10-02 MED ORDER — FENTANYL CITRATE (PF) 100 MCG/2ML IJ SOLN
INTRAMUSCULAR | Status: AC
  Filled 2020-10-02: qty 2

## 2020-10-02 MED ORDER — MIDAZOLAM HCL 5 MG/5ML IJ SOLN
INTRAMUSCULAR | Status: AC
  Filled 2020-10-02: qty 5

## 2020-10-02 MED ORDER — CHLORHEXIDINE GLUCONATE CLOTH 2 % EX PADS: 6.0000 | MEDICATED_PAD | Freq: Every day | CUTANEOUS | Status: DC

## 2020-10-02 MED ORDER — HEPARIN SODIUM (PORCINE) 1000 UNIT/ML IJ SOLN
INTRAMUSCULAR | Status: AC
  Filled 2020-10-02: qty 1

## 2020-10-02 MED ORDER — SODIUM CHLORIDE 0.9 % IV SOLN: INTRAVENOUS | Status: DC

## 2020-10-02 MED ORDER — ONDANSETRON HCL 4 MG/2ML IJ SOLN: 4.0000 mg | Freq: Four times a day (QID) | INTRAMUSCULAR | Status: DC | PRN

## 2020-10-02 MED ORDER — MIDAZOLAM HCL 2 MG/ML PO SYRP: 8.0000 mg | ORAL_SOLUTION | Freq: Once | ORAL | Status: DC | PRN

## 2020-10-02 MED ORDER — FENTANYL CITRATE (PF) 100 MCG/2ML IJ SOLN
INTRAMUSCULAR | Status: DC | PRN
  Administered 2020-10-02: 25 ug via INTRAVENOUS
  Administered 2020-10-02: 50 ug via INTRAVENOUS

## 2020-10-02 MED ORDER — HYDROMORPHONE HCL 1 MG/ML IJ SOLN
1.0000 mg | Freq: Once | INTRAMUSCULAR | Status: DC | PRN
Start: 2020-10-02 — End: 2020-10-02

## 2020-10-02 MED ORDER — IODIXANOL 320 MG/ML IV SOLN
INTRAVENOUS | Status: DC | PRN
  Administered 2020-10-02: 50 mL

## 2020-10-02 MED ORDER — ONDANSETRON HCL 4 MG/2ML IJ SOLN
INTRAMUSCULAR | Status: AC
  Administered 2020-10-02: 4 mg via INTRAVENOUS
  Filled 2020-10-02: qty 2

## 2020-10-02 MED ORDER — MIDAZOLAM HCL 2 MG/2ML IJ SOLN
INTRAMUSCULAR | Status: DC | PRN
  Administered 2020-10-02: 1 mg via INTRAVENOUS
  Administered 2020-10-02: 2 mg via INTRAVENOUS

## 2020-10-02 MED ORDER — METHYLPREDNISOLONE SODIUM SUCC 125 MG IJ SOLR: 125.0000 mg | Freq: Once | INTRAMUSCULAR | Status: DC | PRN

## 2020-10-02 MED ORDER — DIPHENHYDRAMINE HCL 50 MG/ML IJ SOLN: 50.0000 mg | Freq: Once | INTRAMUSCULAR | Status: DC | PRN

## 2020-10-02 MED ORDER — CLINDAMYCIN PHOSPHATE 300 MG/50ML IV SOLN
INTRAVENOUS | Status: AC
  Filled 2020-10-02: qty 50

## 2020-10-02 MED ORDER — HEPARIN SODIUM (PORCINE) 1000 UNIT/ML IJ SOLN
INTRAMUSCULAR | Status: DC | PRN
  Administered 2020-10-02: 5000 [IU] via INTRAVENOUS

## 2020-10-02 MED ORDER — CLINDAMYCIN PHOSPHATE 300 MG/50ML IV SOLN: 300.0000 mg | Freq: Once | INTRAVENOUS | Status: DC

## 2020-10-02 MED ORDER — FAMOTIDINE 20 MG PO TABS: 40.0000 mg | ORAL_TABLET | Freq: Once | ORAL | Status: DC | PRN

## 2020-10-02 SURGICAL SUPPLY — 19 items
BALLN LUTONIX 018 5X80X130 (BALLOONS) ×4
BALLN ULTRVRSE 018 2.5X100X150 (BALLOONS) ×2
BALLOON LUTONIX 018 5X80X130 (BALLOONS) ×2 IMPLANT
BALLOON ULTRVS 018 2.5X100X150 (BALLOONS) ×1 IMPLANT
CATH ANGIO 5F PIGTAIL 65CM (CATHETERS) ×2 IMPLANT
CATH NAVICROSS ANGLED 135CM (MICROCATHETER) ×2 IMPLANT
CATH ROTAREX 135 6FR (CATHETERS) ×2 IMPLANT
CATH VERT 5X100 (CATHETERS) ×2 IMPLANT
COVER PROBE U/S 5X48 (MISCELLANEOUS) ×2 IMPLANT
DEVICE STARCLOSE SE CLOSURE (Vascular Products) ×2 IMPLANT
GLIDEWIRE ADV .035X260CM (WIRE) ×2 IMPLANT
KIT ENCORE 26 ADVANTAGE (KITS) ×2 IMPLANT
PACK ANGIOGRAPHY (CUSTOM PROCEDURE TRAY) ×2 IMPLANT
SHEATH ANL2 6FRX45 HC (SHEATH) ×2 IMPLANT
SHEATH BRITE TIP 5FRX11 (SHEATH) ×2 IMPLANT
STENT VIABAHN 6X7.5X120 (Permanent Stent) ×2 IMPLANT
TUBING CONTRAST HIGH PRESS 72 (TUBING) ×2 IMPLANT
WIRE G V18X300CM (WIRE) ×2 IMPLANT
WIRE GUIDERIGHT .035X150 (WIRE) ×2 IMPLANT

## 2020-10-02 NOTE — Interval H&P Note (Signed)
History and Physical Interval Note:  10/02/2020 8:05 AM  Amy Tran  has presented today for surgery, with the diagnosis of LLE Angiography   ASO with rest pain   BARD Rep  cc: Judi Cong.  The various methods of treatment have been discussed with the patient and family. After consideration of risks, benefits and other options for treatment, the patient has consented to  Procedure(s): LOWER EXTREMITY ANGIOGRAPHY (Left) as a surgical intervention.  The patient's history has been reviewed, patient examined, no change in status, stable for surgery.  I have reviewed the patient's chart and labs.  Questions were answered to the patient's satisfaction.     Leotis Pain

## 2020-10-02 NOTE — Op Note (Signed)
[facility]  Percutaneous Study/Intervention Procedural Note   Date of Surgery: 10/02/2020  Surgeon(s):Ezrah Panning    Assistants:none  Pre-operative Diagnosis: PAD with rest pain LLE  Post-operative diagnosis:  Same  Procedure(s) Performed:             1.  Ultrasound guidance for vascular access right femoral artery             2.  Catheter placement into left common femoral artery from right femoral approach             3.  Aortogram and selective left lower extremity angiogram             4.  Percutaneous transluminal angioplasty of left posterior tibial artery with 2.5 mm diameter by 10 cm length angioplasty balloon             5.  Mechanical thrombectomy to the proximal left popliteal artery with the [place] Rex device  6.  Percutaneous transluminal angioplasty of the proximal left popliteal artery with 5 mm diameter by 8 cm length Lutonix drug-coated angioplasty balloon  7.  Stent placement to proximal left popliteal artery with 6 mm diameter by 7.5 cm length Viabahn stent             8.  StarClose closure device right femoral artery  EBL: 10 cc  Contrast: 50 cc  Fluoro Time: 6.3 minutes  Moderate Conscious Sedation Time: approximately 32 minutes using 3 mg of Versed and 75 mcg of Fentanyl              Indications:  Patient is a 65 y.o.female with rest pain symptoms of the left leg. The patient has noninvasive study showing markedly reduced flow in the left leg from an arterial standpoint with multiple tests over the past year demonstrating this at another institution. The patient is brought in for angiography for further evaluation and potential treatment.  Due to the limb threatening nature of the situation, angiogram was performed for attempted limb salvage. The patient is aware that if the procedure fails, amputation would be expected.  The patient also understands that even with successful revascularization, amputation may still be required due to the  severity of the situation.  Risks and benefits are discussed and informed consent is obtained.   Procedure:  The patient was identified and appropriate procedural time out was performed.  The patient was then placed supine on the table and prepped and draped in the usual sterile fashion. Moderate conscious sedation was administered during a face to face encounter with the patient throughout the procedure with my supervision of the RN administering medicines and monitoring the patient's vital signs, pulse oximetry, telemetry and mental status throughout from the start of the procedure until the patient was taken to the recovery room. Ultrasound was used to evaluate the right common femoral artery.  It was patent .  A digital ultrasound image was acquired.  A Seldinger needle was used to access the right common femoral artery under direct ultrasound guidance and a permanent image was performed.  A 0.035 J wire was advanced without resistance and a 5Fr sheath was placed.  Pigtail catheter was placed into the aorta and an AP aortogram was performed. This demonstrated normal renal arteries and normal aorta and iliac segments without significant stenosis. I then crossed the aortic bifurcation and advanced to the left femoral head. Selective left lower extremity angiogram was then performed. This demonstrated a normal common femoral artery, profunda femoris artery,  and superficial femoral artery down to Hunter's canal where there was an occlusion in the proximal popliteal artery that was relatively short segment of about 4 to 5 cm.  The popliteal artery then normalized in the midsegment and there is a typical tibial trifurcation.  The anterior tibial artery was occluded in the midsegment without good distal reconstitution.  The peroneal artery was continuous without focal stenosis.  The proximal posterior tibial artery had a lot of collaterals with some moderate disease in the 60 to 65% range in the proximal segment.  It  was the normal in the mid and distal segments and was continuous to the foot. It was felt that it was in the patient's best interest to proceed with intervention after these images to avoid a second procedure and a larger amount of contrast and fluoroscopy based off of the findings from the initial angiogram. The patient was systemically heparinized and a 6 [place] Ansell sheath was then placed over the [employer] wire. I then used a Kumpe catheter and the advantage wire to easily cross the occlusion in the proximal popliteal artery and confirm intraluminal flow in the below-knee popliteal artery.  And exchanged for a V 18 wire and a Nava cross catheter across the stenosis in the posterior tibial artery without difficulty parking the wire in the foot.  The catheter was then removed.  Mechanical thrombectomy was then performed in the left proximal popliteal artery using the [place] Rex device with multiple passes made.  Following this, there was still significant stenosis but there was now a channel of blood flow after removal of the chronic thrombus.  I then performed angioplasty of the popliteal artery with a 5 mm diameter by 8 cm length Lutonix drug-coated angioplasty balloon inflated to 8 atm for 1 minute.  I also performed angioplasty of the left posterior tibial artery with a 2.5 mm diameter by 10 cm length angioplasty balloon inflated to 10 atm for 1 minute.  Completion imaging showed about a 20 to 30% residual stenosis in the posterior tibial artery but a greater than 50% residual stenosis in the popliteal artery.  I elected to stent this area.  A 6 mm diameter by 7.5 cm length Viabahn stent was deployed in the left proximal popliteal artery and postdilated with a 5 mm diameter Lutonix drug-coated angioplasty balloon with excellent angiographic completion result and less than 10% residual stenosis. I elected to terminate the procedure. The sheath was removed and StarClose closure device was deployed in the  right femoral artery with excellent hemostatic result. The patient was taken to the recovery room in stable condition having tolerated the procedure well.  Findings:               Aortogram: Normal renal arteries, normal aorta and iliac arteries without significant stenosis             Left lower Extremity: Normal common femoral artery, profunda femoris artery, and superficial femoral artery down to Hunter's canal where there was an occlusion in the proximal popliteal artery that was relatively short segment of about 4 to 5 cm.  The popliteal artery then normalized in the midsegment and there is a typical tibial trifurcation.  The anterior tibial artery was occluded in the midsegment without good distal reconstitution.  The peroneal artery was continuous without focal stenosis.  The proximal posterior tibial artery had a lot of collaterals with some moderate disease in the 60 to 65% range in the proximal segment.  It was the  normal in the mid and distal segments and was continuous to the foot.   Disposition: Patient was taken to the recovery room in stable condition having tolerated the procedure well.  Complications: None  Leotis Pain 10/02/2020 9:46 AM   This note was created with Dragon Medical transcription system. Any errors in dictation are purely unintentional.

## 2020-10-03 ENCOUNTER — Encounter: Payer: Self-pay | Admitting: Vascular Surgery

## 2020-10-09 ENCOUNTER — Inpatient Hospital Stay (HOSPITAL_COMMUNITY): Admission: RE | Admit: 2020-10-09 | Payer: Medicare Other | Source: Ambulatory Visit

## 2020-10-09 ENCOUNTER — Ambulatory Visit: Payer: Medicare Other

## 2020-10-23 ENCOUNTER — Other Ambulatory Visit (INDEPENDENT_AMBULATORY_CARE_PROVIDER_SITE_OTHER): Payer: Self-pay | Admitting: Vascular Surgery

## 2020-10-23 DIAGNOSIS — I70222 Atherosclerosis of native arteries of extremities with rest pain, left leg: Secondary | ICD-10-CM

## 2020-10-23 DIAGNOSIS — Z95820 Peripheral vascular angioplasty status with implants and grafts: Secondary | ICD-10-CM

## 2020-10-23 DIAGNOSIS — L97929 Non-pressure chronic ulcer of unspecified part of left lower leg with unspecified severity: Secondary | ICD-10-CM

## 2020-10-25 ENCOUNTER — Other Ambulatory Visit: Payer: Self-pay

## 2020-10-25 ENCOUNTER — Ambulatory Visit (INDEPENDENT_AMBULATORY_CARE_PROVIDER_SITE_OTHER): Payer: Medicare Other

## 2020-10-25 ENCOUNTER — Ambulatory Visit (INDEPENDENT_AMBULATORY_CARE_PROVIDER_SITE_OTHER): Payer: Medicare Other | Admitting: Nurse Practitioner

## 2020-10-25 ENCOUNTER — Encounter (INDEPENDENT_AMBULATORY_CARE_PROVIDER_SITE_OTHER): Payer: Self-pay | Admitting: Nurse Practitioner

## 2020-10-25 VITALS — BP 150/70 | HR 55 | Ht 64.0 in | Wt 284.0 lb

## 2020-10-25 DIAGNOSIS — F17210 Nicotine dependence, cigarettes, uncomplicated: Secondary | ICD-10-CM

## 2020-10-25 DIAGNOSIS — E119 Type 2 diabetes mellitus without complications: Secondary | ICD-10-CM | POA: Diagnosis not present

## 2020-10-25 DIAGNOSIS — I70222 Atherosclerosis of native arteries of extremities with rest pain, left leg: Secondary | ICD-10-CM

## 2020-10-25 DIAGNOSIS — Z95820 Peripheral vascular angioplasty status with implants and grafts: Secondary | ICD-10-CM | POA: Diagnosis not present

## 2020-10-25 DIAGNOSIS — F17200 Nicotine dependence, unspecified, uncomplicated: Secondary | ICD-10-CM

## 2020-10-25 DIAGNOSIS — I10 Essential (primary) hypertension: Secondary | ICD-10-CM

## 2020-10-30 ENCOUNTER — Encounter (INDEPENDENT_AMBULATORY_CARE_PROVIDER_SITE_OTHER): Payer: Self-pay | Admitting: Nurse Practitioner

## 2020-10-30 NOTE — Progress Notes (Signed)
Subjective:    Patient ID: Amy Tran, female    DOB: 10/22/1955, 65 y.o.   MRN: [id number] Chief Complaint  Patient presents with   Follow-up    3wk [facility] post LE  angio  abi    Amy Tran is a 65 year old female that returns to the office for followup and review status post angiogram with intervention. The patient notes improvement in the lower extremity symptoms. No interval shortening of the patient's claudication distance or rest pain symptoms. Previous wounds have now healed.  No new ulcers or wounds have occurred since the last visit.  There have been no significant changes to the patient's overall health care.  The patient denies amaurosis fugax or recent TIA symptoms. There are no recent neurological changes noted. The patient denies history of DVT, PE or superficial thrombophlebitis. The patient denies recent episodes of angina or shortness of breath.   ABI's Rt=0.99 and Lt=0.97  (previous ABI's Rt=0.99 and Lt=0.51) Duplex US of the right lower extremity reveals triphasic waveforms with good toe waveforms whereas the left has biphasic waveforms with good toe waveforms.   Review of Systems  Cardiovascular:  Positive for leg swelling.  Skin:  Positive for wound.  All other systems reviewed and are negative.     Objective:   Physical Exam Vitals reviewed.  HENT:     Head: Normocephalic.  Cardiovascular:     Rate and Rhythm: Normal rate.     Pulses:          Dorsalis pedis pulses are 1+ on the right side and 1+ on the left side.       Posterior tibial pulses are 1+ on the right side and 1+ on the left side.  Pulmonary:     Effort: Pulmonary effort is normal.  Skin:    General: Skin is warm and dry.  Neurological:     Mental Status: She is alert and oriented to person, place, and time.  Psychiatric:        Mood and Affect: Mood normal.        Behavior: Behavior normal.        Thought Content: Thought content normal.        Judgment: Judgment normal.     BP (!) 150/70   Pulse (!) 55   Ht 5\' 4"  (1.626 m)   Wt 284 lb (128.8 kg)   BMI 48.75 kg/m   Past Medical History:  Diagnosis Date   Arthritis    Asthma    Cancer ([place])    multiple skin cancers   Diabetes mellitus without complication ([place])    Diverticulosis    Fluid retention    GERD (gastroesophageal reflux disease)    High cholesterol    Hypertension    Incomplete rotator cuff tear    Myocardial infarction (HCC)    Obesity    Persistent atrial fibrillation (HCC)    PONV (postoperative nausea and vomiting)    Wears glasses    Wears partial dentures    bottom    Social History   Socioeconomic History   Marital status: Widowed    Spouse name: Not on file   Number of children: 2   Years of education: Not on file   Highest education level: Not on file  Occupational History   Occupation: works at [employer] store    Employer: [employer]   Tobacco Use   Smoking status: Every Day    Packs/day: 0.30    Years: 29.00  Pack years: 8.70    Types: Cigarettes    Start date: 04/09/1970   Smokeless tobacco: Never   Tobacco comments:    09/04/20 - states she is down to 9 ciggs / day   Vaping Use   Vaping Use: Never used  Substance and Sexual Activity   Alcohol use: Not Currently   Drug use: No   Sexual activity: Not Currently    Birth control/protection: Post-menopausal  Other Topics Concern   Not on file  Social History Narrative   Not on file   Social Determinants of Health   Financial Resource Strain: Not on file  Food Insecurity: Not on file  Transportation Needs: Not on file  Physical Activity: Not on file  Stress: Not on file  Social Connections: Not on file  Intimate Partner Violence: Not on file    Past Surgical History:  Procedure Laterality Date   APPENDECTOMY     CARDIOVERSION N/A 06/03/2019   Procedure: CARDIOVERSION;  Surgeon: Herminio Commons, MD;  Location: [facility];  Service: Cardiovascular;  Laterality: N/A;   CARDIOVERSION N/A 07/13/2019    Procedure: CARDIOVERSION;  Surgeon: Geralynn Rile, MD;  Location: [place];  Service: Cardiovascular;  Laterality: N/A;   CARPAL TUNNEL RELEASE Bilateral 2001   bilateral   CERVICAL POLYPECTOMY  04/08/2017   Procedure: POLYPECTOMY;  Surgeon: [name], MD;  Location: [facility];  Service: Gynecology;;  Endometrial   CHOLECYSTECTOMY     COLONOSCOPY  2007   DGL:OVFIEPP internal hemorrhoids. Diminutive rectal polyp at 10 cm, cold biopsied/removed. The remainder of the rectal mucosa appeared normal Swallow left-sided diverticula. Diminutive polyp at the splenic flexure cold biopsied/removed (adenomatous)   COLONOSCOPY N/A 12/28/2013   Procedure: COLONOSCOPY;  Surgeon: Daneil Dolin, MD;  [facility];  Service: Endoscopy;  Laterality: N/A;  730 - moved to 8:30 - Ginger notified pt   CORONARY STENT INTERVENTION N/A 04/27/2019   Procedure: CORONARY STENT INTERVENTION;  Surgeon: Troy Sine, MD;  Location: [place] [facility];  Service: Cardiovascular;  Laterality: N/A;   DILATION AND CURETTAGE, DIAGNOSTIC / THERAPEUTIC  03/07/2017   ERCP  2002   GASTRIC BYPASS  2004   HYSTEROSCOPY WITH D & C N/A 04/08/2017   Procedure: DILATATION AND CURETTAGE /HYSTEROSCOPY;  Surgeon: [name], MD;  Location: [facility];  Service: Gynecology;  Laterality: N/A;   INTRAVASCULAR ULTRASOUND/IVUS N/A 09/12/2017   Procedure: INTRAVASCULAR ULTRASOUND/IVUS;  Surgeon: Elam Dutch, MD;  Location: [place] [facility];  Service: Cardiovascular;  Laterality: N/A;   LEFT HEART CATH AND CORONARY ANGIOGRAPHY N/A 04/27/2019   Procedure: LEFT HEART CATH AND CORONARY ANGIOGRAPHY;  Surgeon: Troy Sine, MD;  Location: [place] [facility];  Service: Cardiovascular;  Laterality: N/A;   LOWER EXTREMITY ANGIOGRAPHY Left 10/02/2020   Procedure: LOWER EXTREMITY ANGIOGRAPHY;  Surgeon: Algernon Huxley, MD;  Location: [place] [facility];  Service: Cardiovascular;  Laterality: Left;   NECK SURGERY  1998    fusion   PERCUTANEOUS VENOUS THROMBECTOMY,LYSIS WITH INTRAVASCULAR ULTRASOUND (IVUS) Left 09/13/2017   Procedure: REMOVAL OF LYSIS CATHETER AND INTRAVASCULAR ULTRASOUND (IVUS) LEFT ILIAC VEIN;  Surgeon: Elam Dutch, MD;  Location: [place];  Service: Vascular;  Laterality: Left;   PERIPHERAL VASCULAR INTERVENTION  09/15/2017   Procedure: PERIPHERAL VASCULAR INTERVENTION;  Surgeon: Waynetta Sandy, MD;  Location: [place] [facility];  Service: Cardiovascular;;  VEINOUS/STENT   PERIPHERAL VASCULAR THROMBECTOMY Left 09/12/2017   Procedure: PERIPHERAL VASCULAR THROMBECTOMY;  Surgeon:  Elam Dutch, MD;  Location: [place] [facility];  Service: Cardiovascular;  Laterality: Left;   PERIPHERAL VASCULAR THROMBECTOMY Left 09/15/2017   Procedure: PERIPHERAL VASCULAR THROMBECTOMY;  Surgeon: Waynetta Sandy, MD;  Location: [place] [facility];  Service: Cardiovascular;  Laterality: Left;  IVC TO LT FEM/POP VEIN   SHOULDER ARTHROSCOPY WITH SUBACROMIAL DECOMPRESSION Left 06/23/2013   Procedure: LEFT SHOULDER ARTHROSCOPY WITH DEBRIDEMENT ROTATOR CUFF AND LABRUM;  Surgeon: Lorn Junes, MD;  Location: [place];  Service: Orthopedics;  Laterality: Left;   SHOULDER SURGERY  1999   left    Family History  Problem Relation Age of Onset   Diabetes Father    Hypertension Father    Parkinson's disease Father    Cancer Father        not sure what kind   Hypertension Mother    Heart disease Mother    Drug abuse Sister    Mesothelioma Brother    Cancer Daughter        non-hodgkins lmphoma   Hypertension Brother    Diabetes Brother    Colon cancer Neg Hx    Gastric cancer Neg Hx    Esophageal cancer Neg Hx     Allergies  Allergen Reactions   Penicillins Hives and Rash    Has patient had a PCN reaction causing immediate rash, facial/tongue/throat swelling, SOB or lightheadedness with hypotension: Yes Has patient had a PCN reaction causing severe rash involving mucus  membranes or skin necrosis: No Has patient had a PCN reaction that required hospitalization No Has patient had a PCN reaction occurring within the last 10 years: No If all of the above answers are "NO", then may proceed with Cephalosporin use.     CBC Latest Ref Rng & Units 06/02/2020 07/06/2019 06/01/2019  WBC 3.4 - 10.8 x10E3/uL 8.2 9.6 11.3(H)  Hemoglobin 11.1 - 15.9 g/dL 12.2 12.5 12.7  Hematocrit 34.0 - 46.6 % 38.0 39.3 40.0  Platelets 150 - 450 x10E3/uL 329 347 381      CMP     Component Value Date/Time   NA 134 (L) 02/10/2020 1050   NA 137 11/03/2019 1123   K 4.0 02/10/2020 1050   CL 100 02/10/2020 1050   CO2 25 02/10/2020 1050   GLUCOSE 146 (H) 02/10/2020 1050   BUN 13 10/02/2020 0814   BUN 9 11/03/2019 1123   CREATININE 0.74 10/02/2020 0814   CALCIUM 8.9 02/10/2020 1050   PROT 8.0 06/01/2019 1319   ALBUMIN 3.8 06/01/2019 1319   AST 18 06/01/2019 1319   ALT 13 06/01/2019 1319   ALKPHOS 68 06/01/2019 1319   BILITOT 1.0 06/01/2019 1319   GFRNONAA >60 10/02/2020 0814   GFRAA 108 11/03/2019 1123     VAS [place] ABI WITH/WO TBI  Result Date: 10/27/2020  LOWER EXTREMITY DOPPLER STUDY Patient Name:  ARLEIGH DICOLA  Date of Exam:   10/25/2020 Medical Rec #: [id number]          Accession #:    [id number] Date of Birth: Dec 14, 1955          Patient Gender: F Patient Age:   70 years Exam Location:  [facility] Procedure:      VAS [place] ABI WITH/WO TBI Referring Phys: GREGORY SCHNIER --------------------------------------------------------------------------------  Indications: Peripheral artery disease.  Vascular Interventions: 10/02/2020: Aortogram and Selective Left Lower Extremity  Angiogram. PTA of the Left Posterior Tibial Artery with                         2.5 mm diameter by 10 cm length Angioplasty balloon.                         Mechanical Thrombectomy to the Left Proximal Left                         Popliteal Artery with the [place] device.  PTA of the                         proximal Left Popliteal Artery with 5 mm diameter by 8                         cm length Lutonix grug -coated angioplasty balloon. Comparison Study: 04/11/2020 Performing Technologist: Almira Coaster RVS  Examination Guidelines: A complete evaluation includes at minimum, Doppler waveform signals and systolic blood pressure reading at the level of bilateral brachial, anterior tibial, and posterior tibial arteries, when vessel segments are accessible. Bilateral testing is considered an integral part of a complete examination. Photoelectric Plethysmograph (PPG) waveforms and toe systolic pressure readings are included as required and additional duplex testing as needed. Limited examinations for reoccurring indications may be performed as noted.  ABI Findings: +---------+------------------+-----+---------+--------+ Right    Rt Pressure (mmHg)IndexWaveform Comment  +---------+------------------+-----+---------+--------+ Brachial 174                                      +---------+------------------+-----+---------+--------+ ATA      167                    triphasic.96      +---------+------------------+-----+---------+--------+ PTA      172               0.99 triphasic         +---------+------------------+-----+---------+--------+ Adair Patter               0.76 Normal            +---------+------------------+-----+---------+--------+ +---------+------------------+-----+--------+-------+ Left     Lt Pressure (mmHg)IndexWaveformComment +---------+------------------+-----+--------+-------+ Brachial 174                                    +---------+------------------+-----+--------+-------+ ATA      153                    biphasic.88     +---------+------------------+-----+--------+-------+ PTA      169               0.97 biphasic        +---------+------------------+-----+--------+-------+ Great Toe87                0.50  Abnormal        +---------+------------------+-----+--------+-------+ +-------+-----------+-----------+------------+------------+ ABI/TBIToday's ABIToday's TBIPrevious ABIPrevious TBI +-------+-----------+-----------+------------+------------+ Right  .99        .76        .99         .25          +-------+-----------+-----------+------------+------------+ Left   .97        .50        .  51         .13          +-------+-----------+-----------+------------+------------+ Bilateral TBIs appear increased compared to prior study on 04/11/2020. Right ABIs appear essentially unchanged compared to prior study on 04/11/2020. Lt ABIs appear to be increased compared to prior study on 04/11/2020.  Summary: Right: Resting right ankle-brachial index is within normal range. No evidence of significant right lower extremity arterial disease. The right toe-brachial index is normal. Left: Resting left ankle-brachial index is within normal range. No evidence of significant left lower extremity arterial disease. The left toe-brachial index is abnormal.  *See table(s) above for measurements and observations.  Electronically signed by Leotis Pain MD on 10/27/2020 at 12:56:58 PM.    Final        Assessment & Plan:   1. Atherosclerosis of native artery of left leg with rest pain ([place]) Recommend:  The patient is status post successful angiogram with intervention.  The patient reports that the claudication symptoms and leg pain is essentially gone.   The patient denies lifestyle limiting changes at this point in time.  No further invasivestudies, angiography or surgery at this time The patient should continue walking and begin a more formal exercise program.  The patient should continue antiplatelet therapy and aggressive treatment of the lipid abnormalities  Smoking cessation was again discussed  The patient should continue wearing graduated compression socks 10-15 mmHg strength to control the mild  edema.  Patient should undergo noninvasive studies as ordered. The patient will follow up with me after the studies.    Patient will follow-up in 3 months  2. Primary hypertension Continue antihypertensive medications as already ordered, these medications have been reviewed and there are no changes at this time.   3. Diabetes mellitus without complication ([place]) Continue hypoglycemic medications as already ordered, these medications have been reviewed and there are no changes at this time.  Hgb A1C to be monitored as already arranged by primary service   4. Tobacco dependence Smoking cessation was discussed, 3-10 minutes spent on this topic specifically    Current Outpatient Medications on File Prior to Visit  Medication Sig Dispense Refill   acetaminophen (TYLENOL) 500 MG tablet Take 500 mg by mouth every 6 (six) hours as needed for mild pain or headache.     albuterol (VENTOLIN HFA) 108 (90 Base) MCG/ACT inhaler Inhale 2 puffs into the lungs every 6 (six) hours as needed for wheezing or shortness of breath.     atorvastatin (LIPITOR) 80 MG tablet TAKE 1 TABLET (80 MG TOTAL) BY MOUTH DAILY AT 6 PM. 90 tablet 2   BD PEN NEEDLE NANO U/F 32G X 4 MM MISC USE FOR VICTOZA INJECTIONS     cetirizine (ZYRTEC) 10 MG tablet Take 10 mg by mouth at bedtime.     clopidogrel (PLAVIX) 75 MG tablet TAKE 1 TABLET (75 MG TOTAL) BY MOUTH DAILY WITH BREAKFAST. 90 tablet 3   Cyanocobalamin 1500 MCG TBDP Take 1,500 mcg by mouth daily with breakfast. Vitamin B12     dofetilide (TIKOSYN) 250 MCG capsule Take 1 capsule (250 mcg total) by mouth 2 (two) times daily. 60 capsule 6   Empagliflozin (JARDIANCE PO) Take by mouth daily.     fluticasone (FLONASE) 50 MCG/ACT nasal spray Place 2 sprays into both nostrils daily as needed for rhinitis (congestion/ dryness).     losartan (COZAAR) 100 MG tablet Take 100 mg by mouth daily with breakfast.      nitroGLYCERIN (NITROSTAT) 0.4 MG  SL tablet Place 1 tablet (0.4 mg  total) under the tongue every 5 (five) minutes as needed for chest pain. 30 tablet 0   potassium chloride SA (KLOR-CON) 20 MEQ tablet Take 1 tablet (20 mEq total) by mouth 2 (two) times daily.     spironolactone (ALDACTONE) 25 MG tablet Take 25 mg by mouth daily with breakfast.      torsemide (DEMADEX) 20 MG tablet Take 60 mg by mouth daily after breakfast.      VICTOZA 18 MG/3ML SOPN Inject 1.8 mg into the skin at bedtime.      VITAMIN D PO Take 2 capsules by mouth daily.     XARELTO 20 MG TABS tablet TAKE 1 TABLET BY MOUTH EVERYDAY AT BEDTIME 30 tablet 2   No current facility-administered medications on file prior to visit.    There are no Patient Instructions on file for this visit. No follow-ups on file.   Kris Hartmann, NP

## 2020-10-31 DIAGNOSIS — J069 Acute upper respiratory infection, unspecified: Secondary | ICD-10-CM | POA: Diagnosis not present

## 2020-11-01 ENCOUNTER — Other Ambulatory Visit: Payer: Self-pay | Admitting: Vascular Surgery

## 2020-11-21 DIAGNOSIS — L723 Sebaceous cyst: Secondary | ICD-10-CM | POA: Diagnosis not present

## 2020-11-21 DIAGNOSIS — M1990 Unspecified osteoarthritis, unspecified site: Secondary | ICD-10-CM | POA: Diagnosis not present

## 2020-11-21 DIAGNOSIS — Z23 Encounter for immunization: Secondary | ICD-10-CM | POA: Diagnosis not present

## 2020-11-25 ENCOUNTER — Other Ambulatory Visit (HOSPITAL_COMMUNITY): Payer: Self-pay | Admitting: Physician Assistant

## 2020-11-28 ENCOUNTER — Telehealth (HOSPITAL_COMMUNITY): Payer: Self-pay

## 2020-11-28 NOTE — Telephone Encounter (Signed)
Left message with Lattie Haw at her home. Advised her to relay message to have patient contact the [facility].

## 2020-12-04 DIAGNOSIS — M7581 Other shoulder lesions, right shoulder: Secondary | ICD-10-CM | POA: Diagnosis not present

## 2020-12-11 ENCOUNTER — Encounter: Payer: Self-pay | Admitting: Cardiology

## 2020-12-11 DIAGNOSIS — E1129 Type 2 diabetes mellitus with other diabetic kidney complication: Secondary | ICD-10-CM | POA: Diagnosis not present

## 2020-12-11 DIAGNOSIS — I4891 Unspecified atrial fibrillation: Secondary | ICD-10-CM | POA: Diagnosis not present

## 2020-12-11 DIAGNOSIS — R609 Edema, unspecified: Secondary | ICD-10-CM | POA: Diagnosis not present

## 2020-12-11 DIAGNOSIS — E785 Hyperlipidemia, unspecified: Secondary | ICD-10-CM | POA: Diagnosis not present

## 2020-12-18 DIAGNOSIS — D509 Iron deficiency anemia, unspecified: Secondary | ICD-10-CM | POA: Diagnosis not present

## 2020-12-18 DIAGNOSIS — I10 Essential (primary) hypertension: Secondary | ICD-10-CM | POA: Diagnosis not present

## 2020-12-18 DIAGNOSIS — R609 Edema, unspecified: Secondary | ICD-10-CM | POA: Diagnosis not present

## 2020-12-18 DIAGNOSIS — E1122 Type 2 diabetes mellitus with diabetic chronic kidney disease: Secondary | ICD-10-CM | POA: Diagnosis not present

## 2020-12-18 DIAGNOSIS — R7309 Other abnormal glucose: Secondary | ICD-10-CM | POA: Diagnosis not present

## 2020-12-18 DIAGNOSIS — I4811 Longstanding persistent atrial fibrillation: Secondary | ICD-10-CM | POA: Diagnosis not present

## 2020-12-19 NOTE — Progress Notes (Signed)
[place] [facility] [address], [place] [address]   CLINIC:  Medical Oncology/Hematology  Patient Care Team: Asencion Noble, MD as PCP - General (Internal Medicine) Harl Bowie Alphonse Guild, MD as PCP - Cardiology (Cardiology) Gala Romney Cristopher Estimable, MD as Consulting Physician (Gastroenterology) Derek Jack, MD as Medical Oncologist (Hematology)  CHIEF COMPLAINTS/PURPOSE OF CONSULTATION:  Evaluation of IDA  HISTORY OF PRESENTING ILLNESS:  Amy Tran 65 y.o. female is here because of evaluation of IDA, at the request of Dr. [facility] Blade. Her latest colonoscopy was on 01/07/2014  Today she reports feeling fair. She is not currently taking iron tablets; she previously took iron tablets 41 years ago during a pregnancy, and she did not tolerate them well as they caused severe diarrhea. She denies current black stools or hematochezia. She has never previously received IV iron. She reports swelling in her left leg. She currently lives at home with her daughter, and she is able to do all of her typical home activities. She works part-time as a Administrator, arts. She currently smokes; she smoked 2-3 ppd for about 40-50 years, she quit for 16 years, and she has resumed smoking 1/2 ppd over the past 5 years. She denies family history of anemia. Her father, her maternal grandmother, and her maternal grandfather had cancer although she cannot recall what kind.   MEDICAL HISTORY:  Past Medical History:  Diagnosis Date   Arthritis    Asthma    Cancer ([facility])    multiple skin cancers   Diabetes mellitus without complication ([facility])    Diverticulosis    Fluid retention    GERD (gastroesophageal reflux disease)    High cholesterol    Hypertension    Incomplete rotator cuff tear    Myocardial infarction ([facility])    Obesity    Persistent atrial fibrillation ([facility])    PONV (postoperative nausea and vomiting)    Wears glasses    Wears partial dentures    bottom    SURGICAL HISTORY: Past  Surgical History:  Procedure Laterality Date   APPENDECTOMY     CARDIOVERSION N/A 06/03/2019   Procedure: CARDIOVERSION;  Surgeon: Herminio Commons, MD;  Location: [facility];  Service: Cardiovascular;  Laterality: N/A;   CARDIOVERSION N/A 07/13/2019   Procedure: CARDIOVERSION;  Surgeon: Geralynn Rile, MD;  Location: [facility];  Service: Cardiovascular;  Laterality: N/A;   CARPAL TUNNEL RELEASE Bilateral 2001   bilateral   CERVICAL POLYPECTOMY  04/08/2017   Procedure: POLYPECTOMY;  Surgeon: [name], MD;  Location: [facility];  Service: Gynecology;;  Endometrial   CHOLECYSTECTOMY     COLONOSCOPY  2007   [id number] internal hemorrhoids. Diminutive rectal polyp at 10 cm, cold biopsied/removed. The remainder of the rectal mucosa appeared normal Swallow left-sided diverticula. Diminutive polyp at the splenic flexure cold biopsied/removed (adenomatous)   COLONOSCOPY N/A 12/28/2013   Procedure: COLONOSCOPY;  Surgeon: Daneil Dolin, MD;  [facility];  Service: Endoscopy;  Laterality: N/A;  730 - moved to 8:30 - Ginger notified pt   CORONARY STENT INTERVENTION N/A 04/27/2019   Procedure: CORONARY STENT INTERVENTION;  Surgeon: [place] Sine, MD;  Location: [facility];  Service: Cardiovascular;  Laterality: N/A;   DILATION AND CURETTAGE, DIAGNOSTIC / THERAPEUTIC  03/07/2017   ERCP  2002   GASTRIC BYPASS  2004   HYSTEROSCOPY WITH D & C N/A 04/08/2017   Procedure: DILATATION AND CURETTAGE /HYSTEROSCOPY;  Surgeon: [name], MD;  Location: [facility];  Service: Gynecology;  Laterality: N/A;   INTRAVASCULAR ULTRASOUND/IVUS N/A 09/12/2017   Procedure: INTRAVASCULAR ULTRASOUND/IVUS;  Surgeon: Elam Dutch, MD;  Location: [facility];  Service: Cardiovascular;  Laterality: N/A;   LEFT HEART CATH AND CORONARY ANGIOGRAPHY N/A 04/27/2019   Procedure: LEFT HEART CATH AND CORONARY ANGIOGRAPHY;  Surgeon: [place] Sine, MD;  Location: [facility];  Service:  Cardiovascular;  Laterality: N/A;   LOWER EXTREMITY ANGIOGRAPHY Left 10/02/2020   Procedure: LOWER EXTREMITY ANGIOGRAPHY;  Surgeon: Algernon Huxley, MD;  Location: [facility];  Service: Cardiovascular;  Laterality: Left;   NECK SURGERY  1998   fusion   PERCUTANEOUS VENOUS THROMBECTOMY,LYSIS WITH INTRAVASCULAR ULTRASOUND (IVUS) Left 09/13/2017   Procedure: REMOVAL OF LYSIS CATHETER AND INTRAVASCULAR ULTRASOUND (IVUS) LEFT ILIAC VEIN;  Surgeon: Elam Dutch, MD;  Location: [facility];  Service: Vascular;  Laterality: Left;   PERIPHERAL VASCULAR INTERVENTION  09/15/2017   Procedure: PERIPHERAL VASCULAR INTERVENTION;  Surgeon: Waynetta Sandy, MD;  Location: [facility];  Service: Cardiovascular;;  VEINOUS/STENT   PERIPHERAL VASCULAR THROMBECTOMY Left 09/12/2017   Procedure: PERIPHERAL VASCULAR THROMBECTOMY;  Surgeon: Elam Dutch, MD;  Location: [facility];  Service: Cardiovascular;  Laterality: Left;   PERIPHERAL VASCULAR THROMBECTOMY Left 09/15/2017   Procedure: PERIPHERAL VASCULAR THROMBECTOMY;  Surgeon: Waynetta Sandy, MD;  Location: [facility];  Service: Cardiovascular;  Laterality: Left;  IVC TO LT FEM/POP VEIN   SHOULDER ARTHROSCOPY WITH SUBACROMIAL DECOMPRESSION Left 06/23/2013   Procedure: LEFT SHOULDER ARTHROSCOPY WITH DEBRIDEMENT ROTATOR CUFF AND LABRUM;  Surgeon: Lorn Junes, MD;  Location: [facility];  Service: Orthopedics;  Laterality: Left;   SHOULDER SURGERY  1999   left    SOCIAL HISTORY: Social History   Socioeconomic History   Marital status: Widowed    Spouse name: Not on file   Number of children: 2   Years of education: Not on file   Highest education level: Not on file  Occupational History   Occupation: works at [employer] store    Employer: [employer]   Tobacco Use   Smoking status: Every Day    Packs/day: 0.30    Years: 29.00    Pack years: 8.70    Types: Cigarettes    Start date: 04/09/1970   Smokeless  tobacco: Never   Tobacco comments:    09/04/20 - states she is down to 9 ciggs / day   Vaping Use   Vaping Use: Never used  Substance and Sexual Activity   Alcohol use: Not Currently   Drug use: No   Sexual activity: Not Currently    Birth control/protection: Post-menopausal  Other Topics Concern   Not on file  Social History Narrative   Not on file   Social Determinants of Health   Financial Resource Strain: Not on file  Food Insecurity: Not on file  Transportation Needs: Not on file  Physical Activity: Not on file  Stress: Not on file  Social Connections: Not on file  Intimate Partner Violence: Not on file    FAMILY HISTORY: Family History  Problem Relation Age of Onset   Diabetes Father    Hypertension Father    Parkinson's disease Father    Cancer Father        not sure what kind   Hypertension Mother    Heart disease Mother    Drug abuse Sister    Mesothelioma Brother    Cancer Daughter  non-hodgkins lmphoma   Hypertension Brother    Diabetes Brother    Colon cancer Neg Hx    Gastric cancer Neg Hx    Esophageal cancer Neg Hx     ALLERGIES:  is allergic to penicillins.  MEDICATIONS:  Current Outpatient Medications  Medication Sig Dispense Refill   atorvastatin (LIPITOR) 80 MG tablet TAKE 1 TABLET (80 MG TOTAL) BY MOUTH DAILY AT 6 PM. 90 tablet 2   BD PEN NEEDLE NANO U/F 32G X 4 MM MISC USE FOR VICTOZA INJECTIONS     cetirizine (ZYRTEC) 10 MG tablet Take 10 mg by mouth at bedtime.     clopidogrel (PLAVIX) 75 MG tablet TAKE 1 TABLET (75 MG TOTAL) BY MOUTH DAILY WITH BREAKFAST. 90 tablet 3   Cyanocobalamin 1500 MCG TBDP Take 1,500 mcg by mouth daily with breakfast. Vitamin B12     dofetilide (TIKOSYN) 250 MCG capsule TAKE 1 CAPSULE BY MOUTH 2 TIMES DAILY. 180 capsule 2   Empagliflozin (JARDIANCE PO) Take 25 mg by mouth daily.     losartan (COZAAR) 100 MG tablet Take 100 mg by mouth daily with breakfast.      potassium chloride SA (KLOR-CON) 20 MEQ  tablet Take 1 tablet (20 mEq total) by mouth 2 (two) times daily.     spironolactone (ALDACTONE) 25 MG tablet Take 25 mg by mouth daily with breakfast.      torsemide (DEMADEX) 20 MG tablet Take 60 mg by mouth daily after breakfast.      VICTOZA 18 MG/3ML SOPN Inject 1.8 mg into the skin at bedtime.      VITAMIN D PO Take 2 capsules by mouth daily.     XARELTO 20 MG TABS tablet TAKE 1 TABLET BY MOUTH EVERYDAY AT BEDTIME 30 tablet 2   acetaminophen (TYLENOL) 500 MG tablet Take 500 mg by mouth every 6 (six) hours as needed for mild pain or headache. (Patient not taking: Reported on 12/20/2020)     albuterol (VENTOLIN HFA) 108 (90 Base) MCG/ACT inhaler Inhale 2 puffs into the lungs every 6 (six) hours as needed for wheezing or shortness of breath. (Patient not taking: Reported on 12/20/2020)     fluticasone (FLONASE) 50 MCG/ACT nasal spray Place 2 sprays into both nostrils daily as needed for rhinitis (congestion/ dryness). (Patient not taking: Reported on 12/20/2020)     nitroGLYCERIN (NITROSTAT) 0.4 MG SL tablet Place 1 tablet (0.4 mg total) under the tongue every 5 (five) minutes as needed for chest pain. (Patient not taking: Reported on 12/20/2020) 30 tablet 0   No current facility-administered medications for this visit.    REVIEW OF SYSTEMS:   Review of Systems  Constitutional:  Positive for appetite change (40%) and fatigue (depleted).  Respiratory:  Positive for cough.   Gastrointestinal:  Negative for blood in stool.  Neurological:  Positive for headaches and numbness.  All other systems reviewed and are negative.   PHYSICAL EXAMINATION: ECOG PERFORMANCE STATUS: 0 - Asymptomatic  Vitals:   12/20/20 0813  BP: (!) 156/74  Pulse: (!) 52  Resp: 18  Temp: 98.3 F (36.8 C)  SpO2: 100%   Filed Weights   12/20/20 0813  Weight: 278 lb 6.4 oz (126.3 kg)   Physical Exam Vitals reviewed.  Constitutional:      Appearance: Normal appearance.  Cardiovascular:     Rate and Rhythm:  Normal rate and regular rhythm.     Pulses: Normal pulses.     Heart sounds: Normal heart sounds.  Pulmonary:  Effort: Pulmonary effort is normal.     Breath sounds: Normal breath sounds.  Abdominal:     Palpations: Abdomen is soft. There is no hepatomegaly, splenomegaly or mass.     Tenderness: There is no abdominal tenderness.  Musculoskeletal:     Right lower leg: 1+ Edema present.     Left lower leg: 2+ Edema present.  Lymphadenopathy:     Upper Body:     Right upper body: No supraclavicular, axillary or pectoral adenopathy.     Left upper body: No supraclavicular, axillary or pectoral adenopathy.     Lower Body: No right inguinal adenopathy. No left inguinal adenopathy.  Neurological:     General: No focal deficit present.     Mental Status: She is alert and oriented to person, place, and time.  Psychiatric:        Mood and Affect: Mood normal.        Behavior: Behavior normal.     LABORATORY DATA:  I have reviewed the data as listed Recent Results (from the past 2160 hour(s))  BUN     Status: None   Collection Time: 10/02/20  8:14 AM  Result Value Ref Range   BUN 13 8 - 23 mg/dL    Comment: Performed at [facility], [address] [place], [place] [address]  Creatinine, serum     Status: None   Collection Time: 10/02/20  8:14 AM  Result Value Ref Range   Creatinine, Ser 0.74 0.44 - 1.00 mg/dL   GFR, Estimated >60 >60 mL/min    Comment: (NOTE) Calculated using the CKD-EPI Creatinine Equation (2021) Performed at [facility], [address] [place], [place] [address]   Glucose, capillary     Status: Abnormal   Collection Time: 10/02/20  8:16 AM  Result Value Ref Range   Glucose-Capillary 149 (H) 70 - 99 mg/dL    Comment: Glucose reference range applies only to samples taken after fasting for at least 8 hours.  Glucose, capillary     Status: Abnormal   Collection Time: 10/02/20  9:50 AM  Result Value Ref Range   Glucose-Capillary 137  (H) 70 - 99 mg/dL    Comment: Glucose reference range applies only to samples taken after fasting for at least 8 hours.    RADIOGRAPHIC STUDIES: I have personally reviewed the radiological images as listed and agreed with the findings in the report. No results found.  ASSESSMENT:  Iron deficiency state: - Patient seen at the request of Dr. [facility] Blade for further management of iron deficiency state. - Labs from 12/11/2020 showed hemoglobin 12.4, MCV 78, low MCH and MCHC and high RDW consistent with iron deficiency state.  Ferritin was 22. - She could not tolerate iron tablets in the past which caused diarrhea.  Denies any prior history of transfusion. - Last colonoscopy on 12/28/2013-colonic diverticulosis. - She is on Xarelto and Plavix for A. fib and peripheral vascular disease. - She complains of extreme fatigue.  Denies any bleeding per rectum or melena.  Social/family history: - Lives at home with her daughter.  Is independent of ADLs and IADLs.  She works as a Environmental education officer at [employer].  She started smoking at age 16, 2 to 3 packs/day and quit for 16 years.  Restarted back again 5 years back when her husband died.  She is currently smoking 10 cigarettes/day now. - Father, maternal grandfather and maternal grandmother had cancers.  Type unknown to the patient.   PLAN:  Iron deficiency state: - She has considerable amount of fatigue.  According to Dr. Ria Comment note, she is not a good candidate to proceed with another colonoscopy based on anticoagulation needs following vascular intervention. - As she could not tolerate oral iron therapy, I have recommended parenteral iron therapy.  She has only 1 medication allergy with penicillin which causes hives. - We talked about parenteral iron therapy and possibility of anaphylactic reactions. - We will schedule her for parenteral iron therapy. - We will see her back in 3 months for follow-up with repeat labs.  We will also check for  other nutritional deficiencies including X25, folic acid, LDH, reticulocyte count, SPEP, ferritin and iron panel at that time.   All questions were answered. The patient knows to call the clinic with any problems, questions or concerns.  Derek Jack, MD 12/20/20 6:00 PM  [facility] [phone]   I, Thana Ates, am acting as a scribe for Dr. Derek Jack.  I, Derek Jack MD, have reviewed the above documentation for accuracy and completeness, and I agree with the above.

## 2020-12-20 ENCOUNTER — Encounter (HOSPITAL_COMMUNITY): Payer: Self-pay | Admitting: Hematology

## 2020-12-20 ENCOUNTER — Inpatient Hospital Stay (HOSPITAL_COMMUNITY): Payer: Medicare Other | Attending: Hematology | Admitting: Hematology

## 2020-12-20 ENCOUNTER — Other Ambulatory Visit: Payer: Self-pay

## 2020-12-20 DIAGNOSIS — I4819 Other persistent atrial fibrillation: Secondary | ICD-10-CM | POA: Insufficient documentation

## 2020-12-20 DIAGNOSIS — Z814 Family history of other substance abuse and dependence: Secondary | ICD-10-CM | POA: Insufficient documentation

## 2020-12-20 DIAGNOSIS — Z8249 Family history of ischemic heart disease and other diseases of the circulatory system: Secondary | ICD-10-CM | POA: Diagnosis not present

## 2020-12-20 DIAGNOSIS — D509 Iron deficiency anemia, unspecified: Secondary | ICD-10-CM | POA: Insufficient documentation

## 2020-12-20 DIAGNOSIS — I10 Essential (primary) hypertension: Secondary | ICD-10-CM | POA: Insufficient documentation

## 2020-12-20 DIAGNOSIS — Z833 Family history of diabetes mellitus: Secondary | ICD-10-CM | POA: Diagnosis not present

## 2020-12-20 DIAGNOSIS — Z808 Family history of malignant neoplasm of other organs or systems: Secondary | ICD-10-CM | POA: Insufficient documentation

## 2020-12-20 DIAGNOSIS — E119 Type 2 diabetes mellitus without complications: Secondary | ICD-10-CM | POA: Diagnosis not present

## 2020-12-20 DIAGNOSIS — F17210 Nicotine dependence, cigarettes, uncomplicated: Secondary | ICD-10-CM | POA: Insufficient documentation

## 2020-12-20 DIAGNOSIS — R718 Other abnormality of red blood cells: Secondary | ICD-10-CM | POA: Diagnosis not present

## 2020-12-20 DIAGNOSIS — Z809 Family history of malignant neoplasm, unspecified: Secondary | ICD-10-CM | POA: Insufficient documentation

## 2020-12-20 NOTE — Patient Instructions (Addendum)
[place] [facility] at [facility] Discharge Instructions  You were seen and examined today by Dr. Delton Coombes. Dr. Delton Coombes is a hematologist, meaning that he specializes in blood disorders. Dr. Delton Coombes discussed your past medical history, family history of cancers/blood disorders, and the events that led to you being here today.  You were referred to Dr. Delton Coombes by Dr. [facility] Blade due to anemia. Dr. Delton Coombes has recommended IV iron infusions. This can be done here in the clinic. You should note improvement in your fatigue following iron infusions.  Follow-up as scheduled in 3 months with lab work prior to your next visit.   Thank you for choosing [place] at [facility] to provide your oncology and hematology care.  To afford each patient quality time with our provider, please arrive at least 15 minutes before your scheduled appointment time.   If you have a lab appointment with the [facility] please come in thru the Main Entrance and check in at the main information desk.  You need to re-schedule your appointment should you arrive 10 or more minutes late.  We strive to give you quality time with our providers, and arriving late affects you and other patients whose appointments are after yours.  Also, if you no show three or more times for appointments you may be dismissed from the clinic at the providers discretion.     Again, thank you for choosing [facility].  Our hope is that these requests will decrease the amount of time that you wait before being seen by our physicians.       _____________________________________________________________  Should you have questions after your visit to [facility], please contact our office at [phone] and follow the prompts.  Our office hours are 8:00 a.m. and 4:30 p.m. Monday - Friday.  Please note that voicemails left after 4:00 p.m. may not be returned until the following  business day.  We are closed weekends and major holidays.  You do have access to a nurse 24-7, just call the main number to the clinic [phone] and do not press any options, hold on the line and a nurse will answer the phone.    For prescription refill requests, have your pharmacy contact our office and allow 72 hours.    Due to Covid, you will need to wear a mask upon entering the hospital. If you do not have a mask, a mask will be given to you at the Main Entrance upon arrival. For doctor visits, patients may have 1 support person age 9 or older with them. For treatment visits, patients can not have anyone with them due to social distancing guidelines and our immunocompromised population.

## 2020-12-21 MED FILL — Ferumoxytol Inj 510 MG/17ML (30 MG/ML) (Elemental Fe): INTRAVENOUS | Qty: 17 | Status: AC

## 2020-12-22 ENCOUNTER — Other Ambulatory Visit: Payer: Self-pay

## 2020-12-22 ENCOUNTER — Inpatient Hospital Stay (HOSPITAL_COMMUNITY): Payer: Medicare Other

## 2020-12-22 VITALS — BP 145/53 | HR 50 | Temp 96.7°F | Resp 18

## 2020-12-22 DIAGNOSIS — Z833 Family history of diabetes mellitus: Secondary | ICD-10-CM | POA: Diagnosis not present

## 2020-12-22 DIAGNOSIS — Z8249 Family history of ischemic heart disease and other diseases of the circulatory system: Secondary | ICD-10-CM | POA: Diagnosis not present

## 2020-12-22 DIAGNOSIS — I4819 Other persistent atrial fibrillation: Secondary | ICD-10-CM | POA: Diagnosis not present

## 2020-12-22 DIAGNOSIS — D509 Iron deficiency anemia, unspecified: Secondary | ICD-10-CM | POA: Diagnosis not present

## 2020-12-22 DIAGNOSIS — R718 Other abnormality of red blood cells: Secondary | ICD-10-CM

## 2020-12-22 DIAGNOSIS — F17210 Nicotine dependence, cigarettes, uncomplicated: Secondary | ICD-10-CM | POA: Diagnosis not present

## 2020-12-22 DIAGNOSIS — I10 Essential (primary) hypertension: Secondary | ICD-10-CM | POA: Diagnosis not present

## 2020-12-22 DIAGNOSIS — Z808 Family history of malignant neoplasm of other organs or systems: Secondary | ICD-10-CM | POA: Diagnosis not present

## 2020-12-22 DIAGNOSIS — Z809 Family history of malignant neoplasm, unspecified: Secondary | ICD-10-CM | POA: Diagnosis not present

## 2020-12-22 DIAGNOSIS — E119 Type 2 diabetes mellitus without complications: Secondary | ICD-10-CM | POA: Diagnosis not present

## 2020-12-22 MED ORDER — LORATADINE 10 MG PO TABS
10.0000 mg | ORAL_TABLET | Freq: Once | ORAL | Status: AC
  Administered 2020-12-22: 10 mg via ORAL
  Filled 2020-12-22: qty 1

## 2020-12-22 MED ORDER — SODIUM CHLORIDE 0.9 % IV SOLN: Freq: Once | INTRAVENOUS | Status: AC

## 2020-12-22 MED ORDER — ACETAMINOPHEN 325 MG PO TABS
650.0000 mg | ORAL_TABLET | Freq: Once | ORAL | Status: AC
  Administered 2020-12-22: 650 mg via ORAL
  Filled 2020-12-22: qty 2

## 2020-12-22 MED ORDER — SODIUM CHLORIDE 0.9 % IV SOLN
510.0000 mg | Freq: Once | INTRAVENOUS | Status: AC
  Administered 2020-12-22: 510 mg via INTRAVENOUS
  Filled 2020-12-22: qty 510

## 2020-12-22 NOTE — Progress Notes (Signed)
Pt presents today for Feraheme per provider's order. Vital signs stable and pt voiced no new complaints at this time.  Peripheral IV started with good blood return pre and post infusion.  Feraheme given today per MD orders. Tolerated infusion without adverse affects. Vital signs stable. No complaints at this time. Discharged from clinic ambulatory in stable condition. Alert and oriented x 3. F/U with [facility] as scheduled.

## 2020-12-22 NOTE — Patient Instructions (Signed)
[facility]  Discharge Instructions: °Thank you for choosing [facility] to provide your oncology and hematology care.  °If you have a lab appointment with the Cancer Center, please come in thru the Main Entrance and check in at the main information desk. ° °Wear comfortable clothing and clothing appropriate for easy access to any Portacath or PICC line.  ° °We strive to give you quality time with your provider. You may need to reschedule your appointment if you arrive late (15 or more minutes).  Arriving late affects you and other patients whose appointments are after yours.  Also, if you miss three or more appointments without notifying the office, you may be dismissed from the clinic at the provider’s discretion.    °  °For prescription refill requests, have your pharmacy contact our office and allow 72 hours for refills to be completed.   ° °Today you received the following chemotherapy and/or immunotherapy agents Feraheme    °  °To help prevent nausea and vomiting after your treatment, we encourage you to take your nausea medication as directed. ° °BELOW ARE SYMPTOMS THAT SHOULD BE REPORTED IMMEDIATELY: °*FEVER GREATER THAN 100.4 F (38 °C) OR HIGHER °*CHILLS OR SWEATING °*NAUSEA AND VOMITING THAT IS NOT CONTROLLED WITH YOUR NAUSEA MEDICATION °*UNUSUAL SHORTNESS OF BREATH °*UNUSUAL BRUISING OR BLEEDING °*URINARY PROBLEMS (pain or burning when urinating, or frequent urination) °*BOWEL PROBLEMS (unusual diarrhea, constipation, pain near the anus) °TENDERNESS IN MOUTH AND THROAT WITH OR WITHOUT PRESENCE OF ULCERS (sore throat, sores in mouth, or a toothache) °UNUSUAL RASH, SWELLING OR PAIN  °UNUSUAL VAGINAL DISCHARGE OR ITCHING  ° °Items with * indicate a potential emergency and should be followed up as soon as possible or go to the Emergency Department if any problems should occur. ° °Please show the CHEMOTHERAPY ALERT CARD or IMMUNOTHERAPY ALERT CARD at check-in to the Emergency  Department and triage nurse. ° °Should you have questions after your visit or need to cancel or reschedule your appointment, please contact [facility] [phone]  and follow the prompts.  Office hours are 8:00 a.m. to 4:30 p.m. Monday - Friday. Please note that voicemails left after 4:00 p.m. may not be returned until the following business day.  We are closed weekends and major holidays. You have access to a nurse at all times for urgent questions. Please call the main number to the clinic [phone] and follow the prompts. ° °For any non-urgent questions, you may also contact your provider using MyChart. We now offer e-Visits for anyone 18 and older to request care online for non-urgent symptoms. For details visit [email or web address]. °  °Also download the MyChart app! Go to the app store, search "MyChart", open the app, select , and log in with your MyChart username and password. ° °Due to Covid, a mask is required upon entering the hospital/clinic. If you do not have a mask, one will be given to you upon arrival. For doctor visits, patients may have 1 support person aged 18 or older with them. For treatment visits, patients cannot have anyone with them due to current Covid guidelines and our immunocompromised population.  °

## 2020-12-26 ENCOUNTER — Ambulatory Visit (HOSPITAL_COMMUNITY): Payer: Medicare Other

## 2020-12-29 ENCOUNTER — Other Ambulatory Visit: Payer: Self-pay

## 2020-12-29 ENCOUNTER — Inpatient Hospital Stay (HOSPITAL_COMMUNITY): Payer: Medicare Other

## 2020-12-29 VITALS — BP 139/59 | HR 47 | Temp 98.1°F | Resp 16

## 2020-12-29 DIAGNOSIS — R718 Other abnormality of red blood cells: Secondary | ICD-10-CM

## 2020-12-29 DIAGNOSIS — F17210 Nicotine dependence, cigarettes, uncomplicated: Secondary | ICD-10-CM | POA: Diagnosis not present

## 2020-12-29 DIAGNOSIS — I10 Essential (primary) hypertension: Secondary | ICD-10-CM | POA: Diagnosis not present

## 2020-12-29 DIAGNOSIS — Z8249 Family history of ischemic heart disease and other diseases of the circulatory system: Secondary | ICD-10-CM | POA: Diagnosis not present

## 2020-12-29 DIAGNOSIS — I4819 Other persistent atrial fibrillation: Secondary | ICD-10-CM | POA: Diagnosis not present

## 2020-12-29 DIAGNOSIS — D509 Iron deficiency anemia, unspecified: Secondary | ICD-10-CM | POA: Diagnosis not present

## 2020-12-29 DIAGNOSIS — Z833 Family history of diabetes mellitus: Secondary | ICD-10-CM | POA: Diagnosis not present

## 2020-12-29 DIAGNOSIS — Z809 Family history of malignant neoplasm, unspecified: Secondary | ICD-10-CM | POA: Diagnosis not present

## 2020-12-29 DIAGNOSIS — E119 Type 2 diabetes mellitus without complications: Secondary | ICD-10-CM | POA: Diagnosis not present

## 2020-12-29 DIAGNOSIS — Z808 Family history of malignant neoplasm of other organs or systems: Secondary | ICD-10-CM | POA: Diagnosis not present

## 2020-12-29 MED ORDER — LORATADINE 10 MG PO TABS
10.0000 mg | ORAL_TABLET | Freq: Once | ORAL | Status: AC
  Administered 2020-12-29: 10 mg via ORAL
  Filled 2020-12-29: qty 1

## 2020-12-29 MED ORDER — ACETAMINOPHEN 325 MG PO TABS
650.0000 mg | ORAL_TABLET | Freq: Once | ORAL | Status: AC
  Administered 2020-12-29: 650 mg via ORAL
  Filled 2020-12-29: qty 2

## 2020-12-29 MED ORDER — SODIUM CHLORIDE 0.9 % IV SOLN
Freq: Once | INTRAVENOUS | Status: AC
Start: 2020-12-29 — End: 2020-12-29

## 2020-12-29 MED ORDER — SODIUM CHLORIDE 0.9 % IV SOLN
510.0000 mg | Freq: Once | INTRAVENOUS | Status: AC
  Administered 2020-12-29: 510 mg via INTRAVENOUS
  Filled 2020-12-29: qty 510

## 2020-12-29 NOTE — Progress Notes (Signed)
Patient presents today for iron infusion.  Patient is in satisfactory condition with no new complaints voiced.  Vital signs are stable.  We will proceed with treatment per MD orders.   Patient tolerated treatment well with no complaints voiced.  Patient left ambulatory in stable condition.  Vital signs stable at discharge.  Follow up as scheduled.    

## 2020-12-29 NOTE — Patient Instructions (Signed)
[facility]  Discharge Instructions: Thank you for choosing [facility] to provide your oncology and hematology care.  If you have a lab appointment with the Cancer Center, please come in thru the Main Entrance and check in at the main information desk.  Wear comfortable clothing and clothing appropriate for easy access to any Portacath or PICC line.   We strive to give you quality time with your provider. You may need to reschedule your appointment if you arrive late (15 or more minutes).  Arriving late affects you and other patients whose appointments are after yours.  Also, if you miss three or more appointments without notifying the office, you may be dismissed from the clinic at the provider's discretion.      For prescription refill requests, have your pharmacy contact our office and allow 72 hours for refills to be completed.        To help prevent nausea and vomiting after your treatment, we encourage you to take your nausea medication as directed.  BELOW ARE SYMPTOMS THAT SHOULD BE REPORTED IMMEDIATELY: *FEVER GREATER THAN 100.4 F (38 C) OR HIGHER *CHILLS OR SWEATING *NAUSEA AND VOMITING THAT IS NOT CONTROLLED WITH YOUR NAUSEA MEDICATION *UNUSUAL SHORTNESS OF BREATH *UNUSUAL BRUISING OR BLEEDING *URINARY PROBLEMS (pain or burning when urinating, or frequent urination) *BOWEL PROBLEMS (unusual diarrhea, constipation, pain near the anus) TENDERNESS IN MOUTH AND THROAT WITH OR WITHOUT PRESENCE OF ULCERS (sore throat, sores in mouth, or a toothache) UNUSUAL RASH, SWELLING OR PAIN  UNUSUAL VAGINAL DISCHARGE OR ITCHING   Items with * indicate a potential emergency and should be followed up as soon as possible or go to the Emergency Department if any problems should occur.  Please show the CHEMOTHERAPY ALERT CARD or IMMUNOTHERAPY ALERT CARD at check-in to the Emergency Department and triage nurse.  Should you have questions after your visit or need to cancel  or reschedule your appointment, please contact [facility] [phone]  and follow the prompts.  Office hours are 8:00 a.m. to 4:30 p.m. Monday - Friday. Please note that voicemails left after 4:00 p.m. may not be returned until the following business day.  We are closed weekends and major holidays. You have access to a nurse at all times for urgent questions. Please call the main number to the clinic [phone] and follow the prompts.  For any non-urgent questions, you may also contact your provider using MyChart. We now offer e-Visits for anyone 18 and older to request care online for non-urgent symptoms. For details visit [email or web address].   Also download the MyChart app! Go to the app store, search "MyChart", open the app, select [facility], and log in with your MyChart username and password.  Due to Covid, a mask is required upon entering the hospital/clinic. If you do not have a mask, one will be given to you upon arrival. For doctor visits, patients may have 1 support person aged 18 or older with them. For treatment visits, patients cannot have anyone with them due to current Covid guidelines and our immunocompromised population.  

## 2021-01-02 ENCOUNTER — Ambulatory Visit (HOSPITAL_COMMUNITY): Payer: Medicare Other

## 2021-01-12 ENCOUNTER — Ambulatory Visit (HOSPITAL_COMMUNITY): Payer: Medicare Other

## 2021-01-22 ENCOUNTER — Other Ambulatory Visit (INDEPENDENT_AMBULATORY_CARE_PROVIDER_SITE_OTHER): Payer: Self-pay | Admitting: Nurse Practitioner

## 2021-01-22 DIAGNOSIS — Z9889 Other specified postprocedural states: Secondary | ICD-10-CM

## 2021-01-22 DIAGNOSIS — I739 Peripheral vascular disease, unspecified: Secondary | ICD-10-CM

## 2021-01-23 ENCOUNTER — Ambulatory Visit (INDEPENDENT_AMBULATORY_CARE_PROVIDER_SITE_OTHER): Payer: Medicare Other

## 2021-01-23 ENCOUNTER — Ambulatory Visit (INDEPENDENT_AMBULATORY_CARE_PROVIDER_SITE_OTHER): Payer: Medicare Other | Admitting: Vascular Surgery

## 2021-01-23 ENCOUNTER — Other Ambulatory Visit: Payer: Self-pay

## 2021-01-23 DIAGNOSIS — Z9889 Other specified postprocedural states: Secondary | ICD-10-CM

## 2021-01-23 DIAGNOSIS — I739 Peripheral vascular disease, unspecified: Secondary | ICD-10-CM | POA: Diagnosis not present

## 2021-01-26 ENCOUNTER — Encounter (INDEPENDENT_AMBULATORY_CARE_PROVIDER_SITE_OTHER): Payer: Self-pay | Admitting: *Deleted

## 2021-02-11 ENCOUNTER — Other Ambulatory Visit: Payer: Self-pay | Admitting: Cardiology

## 2021-02-26 ENCOUNTER — Other Ambulatory Visit: Payer: Self-pay | Admitting: Vascular Surgery

## 2021-03-01 ENCOUNTER — Encounter (HOSPITAL_COMMUNITY): Payer: Self-pay | Admitting: Hematology

## 2021-03-06 ENCOUNTER — Emergency Department (HOSPITAL_COMMUNITY): Payer: Medicare Other

## 2021-03-06 ENCOUNTER — Other Ambulatory Visit: Payer: Self-pay

## 2021-03-06 ENCOUNTER — Encounter (HOSPITAL_COMMUNITY): Payer: Self-pay

## 2021-03-06 ENCOUNTER — Emergency Department (HOSPITAL_COMMUNITY)
Admission: EM | Admit: 2021-03-06 | Discharge: 2021-03-06 | Disposition: A | Discharge status: Home / Self Care | Payer: Medicare Other | Attending: Emergency Medicine | Admitting: Emergency Medicine

## 2021-03-06 DIAGNOSIS — R059 Cough, unspecified: Secondary | ICD-10-CM | POA: Diagnosis not present

## 2021-03-06 DIAGNOSIS — E119 Type 2 diabetes mellitus without complications: Secondary | ICD-10-CM | POA: Diagnosis not present

## 2021-03-06 DIAGNOSIS — J45909 Unspecified asthma, uncomplicated: Secondary | ICD-10-CM | POA: Insufficient documentation

## 2021-03-06 DIAGNOSIS — I10 Essential (primary) hypertension: Secondary | ICD-10-CM | POA: Insufficient documentation

## 2021-03-06 DIAGNOSIS — R112 Nausea with vomiting, unspecified: Secondary | ICD-10-CM | POA: Diagnosis not present

## 2021-03-06 DIAGNOSIS — R42 Dizziness and giddiness: Secondary | ICD-10-CM | POA: Diagnosis not present

## 2021-03-06 DIAGNOSIS — Z79899 Other long term (current) drug therapy: Secondary | ICD-10-CM | POA: Insufficient documentation

## 2021-03-06 DIAGNOSIS — Z20822 Contact with and (suspected) exposure to covid-19: Secondary | ICD-10-CM | POA: Diagnosis not present

## 2021-03-06 DIAGNOSIS — Z7901 Long term (current) use of anticoagulants: Secondary | ICD-10-CM | POA: Diagnosis not present

## 2021-03-06 DIAGNOSIS — R7401 Elevation of levels of liver transaminase levels: Secondary | ICD-10-CM | POA: Diagnosis not present

## 2021-03-06 DIAGNOSIS — R0689 Other abnormalities of breathing: Secondary | ICD-10-CM | POA: Insufficient documentation

## 2021-03-06 DIAGNOSIS — R55 Syncope and collapse: Secondary | ICD-10-CM | POA: Diagnosis not present

## 2021-03-06 DIAGNOSIS — I4891 Unspecified atrial fibrillation: Secondary | ICD-10-CM | POA: Insufficient documentation

## 2021-03-06 DIAGNOSIS — R531 Weakness: Secondary | ICD-10-CM | POA: Diagnosis not present

## 2021-03-06 LAB — BRAIN NATRIURETIC PEPTIDE: B Natriuretic Peptide: 157 pg/mL — ABNORMAL HIGH (ref 0.0–100.0)

## 2021-03-06 LAB — CBC WITH DIFFERENTIAL/PLATELET
Abs Immature Granulocytes: 0.02 10*3/uL (ref 0.00–0.07)
Basophils Absolute: 0 10*3/uL (ref 0.0–0.1)
Basophils Relative: 0 %
Eosinophils Absolute: 0 10*3/uL (ref 0.0–0.5)
Eosinophils Relative: 1 %
HCT: 42.5 % (ref 36.0–46.0)
Hemoglobin: 13.8 g/dL (ref 12.0–15.0)
Immature Granulocytes: 0 %
Lymphocytes Relative: 18 %
Lymphs Abs: 1.4 10*3/uL (ref 0.7–4.0)
MCH: 29.1 pg (ref 26.0–34.0)
MCHC: 32.5 g/dL (ref 30.0–36.0)
MCV: 89.7 fL (ref 80.0–100.0)
Monocytes Absolute: 0.7 10*3/uL (ref 0.1–1.0)
Monocytes Relative: 9 %
Neutro Abs: 5.4 10*3/uL (ref 1.7–7.7)
Neutrophils Relative %: 72 %
Platelets: 251 10*3/uL (ref 150–400)
RBC: 4.74 MIL/uL (ref 3.87–5.11)
RDW: 19.5 % — ABNORMAL HIGH (ref 11.5–15.5)
WBC: 7.5 10*3/uL (ref 4.0–10.5)
nRBC: 0 % (ref 0.0–0.2)

## 2021-03-06 LAB — COMPREHENSIVE METABOLIC PANEL
ALT: 101 U/L — ABNORMAL HIGH (ref 0–44)
AST: 148 U/L — ABNORMAL HIGH (ref 15–41)
Albumin: 4.1 g/dL (ref 3.5–5.0)
Alkaline Phosphatase: 95 U/L (ref 38–126)
Anion gap: 5 (ref 5–15)
BUN: 11 mg/dL (ref 8–23)
CO2: 26 mmol/L (ref 22–32)
Calcium: 9.1 mg/dL (ref 8.9–10.3)
Chloride: 103 mmol/L (ref 98–111)
Creatinine, Ser: 0.7 mg/dL (ref 0.44–1.00)
GFR, Estimated: >60 mL/min (ref >60)
Glucose, Bld: 157 mg/dL — ABNORMAL HIGH (ref 70–99)
Potassium: 3.7 mmol/L (ref 3.5–5.1)
Sodium: 134 mmol/L — ABNORMAL LOW (ref 135–145)
Total Bilirubin: 1.2 mg/dL (ref 0.3–1.2)
Total Protein: 7.6 g/dL (ref 6.5–8.1)

## 2021-03-06 LAB — RESP PANEL BY RT-PCR (FLU A&B, COVID) ARPGX2
Influenza A by PCR: NEGATIVE
Influenza B by PCR: NEGATIVE
SARS Coronavirus 2 by RT PCR: NEGATIVE

## 2021-03-06 LAB — HEPATITIS PANEL, ACUTE
HCV Ab: NONREACTIVE
Hep A IgM: NONREACTIVE
Hep B C IgM: NONREACTIVE
Hepatitis B Surface Ag: NONREACTIVE

## 2021-03-06 LAB — TROPONIN I (HIGH SENSITIVITY)
Troponin I (High Sensitivity): 14 ng/L (ref <18)
Troponin I (High Sensitivity): 11 ng/L (ref <18)

## 2021-03-06 LAB — LIPASE, BLOOD: Lipase: 51 U/L (ref 11–51)

## 2021-03-06 MED ORDER — SODIUM CHLORIDE 0.9 % IV BOLUS
500.0000 mL | Freq: Once | INTRAVENOUS | Status: AC
  Administered 2021-03-06: 10:00:00 500 mL via INTRAVENOUS

## 2021-03-06 MED ORDER — ONDANSETRON HCL 4 MG PO TABS: 4.0000 mg | ORAL_TABLET | Freq: Four times a day (QID) | ORAL | 0 refills | Status: DC

## 2021-03-06 MED ORDER — ONDANSETRON HCL 4 MG/2ML IJ SOLN
4.0000 mg | Freq: Once | INTRAMUSCULAR | Status: AC
  Administered 2021-03-06: 10:00:00 4 mg via INTRAVENOUS
  Filled 2021-03-06: qty 2

## 2021-03-06 NOTE — ED Notes (Signed)
PO challenge pt was able to keep fluids down.

## 2021-03-06 NOTE — ED Provider Notes (Signed)
[facility] EMERGENCY DEPARTMENT Provider Note   CSN: [id number] Arrival date & time: 03/06/21  5885     History  Chief Complaint  Patient presents with   Dizziness    Amy Tran is a 66 y.o. female with a history significant for type 2 diabetes, hypertension, asthma, GERD, history of ACS with a non-STEMI, persistent atrial fibrillation who is on Plavix and Xarelto chronically, presents with a near syncopal event.  She has had a cough which is productive of a white sputum for the past 1 to 2 weeks, her PCP put her on doxycycline and hydrocodone cough syrup which she started 2 days ago.  Starting yesterday evening she developed nausea with emesis, reporting 2 episodes of vomiting last night and then again this morning.  She was at work when she started to feel lightheaded and describes nearly passing out.  She denies chest pain, shortness of breath, headache, also denies abdominal pain, no diarrhea.  She does not recall feeling palpitations surrounding this morning's event.  She denies hematemesis.  She has had no treatment prior to arrival.  The history is provided by the patient.      Home Medications Prior to Admission medications   Medication Sig Start Date End Date Taking? Authorizing Provider  acetaminophen (TYLENOL) 500 MG tablet Take 500 mg by mouth every 6 (six) hours as needed for mild pain or headache.   Yes [provider]  albuterol (VENTOLIN HFA) 108 (90 Base) MCG/ACT inhaler Inhale 2 puffs into the lungs every 6 (six) hours as needed for wheezing or shortness of breath.   Yes [provider]  atorvastatin (LIPITOR) 80 MG tablet TAKE 1 TABLET BY MOUTH DAILY AT 6 PM. 02/13/21  Yes Branch, Alphonse Guild, MD  BD PEN NEEDLE NANO U/F 32G X 4 MM MISC USE FOR [place] INJECTIONS 08/03/18  Yes [provider]  cetirizine (ZYRTEC) 10 MG tablet Take 10 mg by mouth at bedtime.   Yes [provider]  clopidogrel (PLAVIX) 75 MG tablet TAKE 1 TABLET (75  MG TOTAL) BY MOUTH DAILY WITH BREAKFAST. 06/12/20  Yes Imogene Burn, PA-C  dofetilide (TIKOSYN) 250 MCG capsule TAKE 1 CAPSULE BY MOUTH 2 TIMES DAILY. 11/27/20  Yes Allred, Jeneen Rinks, MD  doxycycline (VIBRAMYCIN) 100 MG capsule Take 100 mg by mouth 2 (two) times daily. 02/27/21  Yes [provider]  fluticasone (FLONASE) 50 MCG/ACT nasal spray Place 2 sprays into both nostrils daily as needed for rhinitis (congestion/ dryness). 06/29/18  Yes [provider]  HYDROcodone bit-homatropine (HYCODAN) 5-1.5 MG/5ML syrup Take 5 mLs by mouth every 4 (four) hours. 02/27/21  Yes [provider]  JARDIANCE 10 MG TABS tablet Take 10 mg by mouth daily. 02/11/21  Yes [provider]  losartan (COZAAR) 100 MG tablet Take 100 mg by mouth daily with breakfast.    Yes [provider]  nitroGLYCERIN (NITROSTAT) 0.4 MG SL tablet Place 1 tablet (0.4 mg total) under the tongue every 5 (five) minutes as needed for chest pain. 04/28/19  Yes Florencia Reasons, MD  ondansetron (ZOFRAN) 4 MG tablet Take 1 tablet (4 mg total) by mouth every 6 (six) hours. 03/06/21  Yes Jalie Eiland, Almyra Free, PA-C  potassium chloride SA (KLOR-CON) 20 MEQ tablet Take 1 tablet (20 mEq total) by mouth 2 (two) times daily. 09/23/19  Yes Shirley Friar, PA-C  spironolactone (ALDACTONE) 25 MG tablet Take 25 mg by mouth daily with breakfast.    Yes [provider]  torsemide ([place])  20 MG tablet Take 60 mg by mouth daily after breakfast.    Yes [provider]  VICTOZA 18 MG/3ML SOPN Inject 1.8 mg into the skin at bedtime.  08/04/18  Yes [provider]  VITAMIN D PO Take 2 capsules by mouth daily.   Yes [provider]  XARELTO 20 MG TABS tablet TAKE 1 TABLET BY MOUTH EVERYDAY AT BEDTIME 02/26/21  Yes Waynetta Sandy, MD      Allergies    Penicillins    Review of Systems   Review of Systems  Constitutional:  Negative for chills and fever.  HENT:  Positive for congestion.  Negative for sore throat.   Eyes: Negative.   Respiratory:  Positive for cough and wheezing. Negative for chest tightness and shortness of breath.   Cardiovascular:  Negative for chest pain.  Gastrointestinal:  Positive for nausea and vomiting. Negative for abdominal pain and diarrhea.  Genitourinary: Negative.   Musculoskeletal:  Negative for arthralgias, joint swelling and neck pain.  Skin: Negative.  Negative for rash and wound.  Neurological:  Positive for weakness and light-headedness. Negative for dizziness, numbness and headaches.  Psychiatric/Behavioral: Negative.     Physical Exam Updated Vital Signs BP (!) 139/58    Pulse (!) 43    Temp 97.7 F (36.5 C) (Oral)    Resp 17    Ht _0  (1.626 m)    Wt 122.5 kg    SpO2 93%    BMI 46.35 kg/m  Physical Exam Vitals and nursing note reviewed.  Constitutional:      Appearance: She is well-developed.  HENT:     Head: Normocephalic and atraumatic.     Mouth/Throat:     Mouth: Mucous membranes are dry.  Eyes:     Conjunctiva/sclera: Conjunctivae normal.  Cardiovascular:     Rate and Rhythm: Normal rate and regular rhythm.     Heart sounds: Normal heart sounds.  Pulmonary:     Effort: Pulmonary effort is normal.     Breath sounds: Normal breath sounds. No wheezing.  Abdominal:     General: Bowel sounds are normal.     Palpations: Abdomen is soft.     Tenderness: There is no abdominal tenderness. There is no guarding or rebound.  Musculoskeletal:        General: Normal range of motion.     Cervical back: Normal range of motion.  Skin:    General: Skin is warm and dry.  Neurological:     Mental Status: She is alert.    ED Results / Procedures / Treatments   Labs (all labs ordered are listed, but only abnormal results are displayed) Labs Reviewed  CBC WITH DIFFERENTIAL/PLATELET - Abnormal; Notable for the following components:      Result Value   RDW 19.5 (*)    All other components within normal limits  COMPREHENSIVE  METABOLIC PANEL - Abnormal; Notable for the following components:   Sodium 134 (*)    Glucose, Bld 157 (*)    AST 148 (*)    ALT 101 (*)    All other components within normal limits  BRAIN NATRIURETIC PEPTIDE - Abnormal; Notable for the following components:   B Natriuretic Peptide 157.0 (*)    All other components within normal limits  RESP PANEL BY RT-PCR (FLU A&B, COVID) ARPGX2  LIPASE, BLOOD  HEPATITIS PANEL, ACUTE  TROPONIN I (HIGH SENSITIVITY)  TROPONIN I (HIGH SENSITIVITY)    EKG EKG Interpretation  Date/Time:  Tuesday March 06 2021 10:48:49 EST Ventricular Rate:  50 PR Interval:  198 QRS Duration: 131 QT Interval:  504 QTC Calculation: 460 R Axis:   39 Text Interpretation: Sinus bradycardia Nonspecific intraventricular conduction delay similar to Dec 2021 Confirmed by Sherwood Gambler [phone]) on 03/06/2021 3:04:07 PM  Radiology DG Chest Port 1 View  Result Date: 03/06/2021 CLINICAL DATA:  Cough and weakness. EXAM: PORTABLE CHEST 1 VIEW COMPARISON:  04/25/2019 FINDINGS: Both lungs are clear. Surgical plate in the lower cervical spine. Heart size is within normal limits and stable. Bridging osteophytes in the thoracic spine. IMPRESSION: No active disease. Electronically Signed   By: Markus Daft M.D.   On: 03/06/2021 10:04    Procedures Procedures    Medications Ordered in ED Medications  sodium chloride 0.9 % bolus 500 mL (500 mLs Intravenous Bolus 03/06/21 1014)  ondansetron (ZOFRAN) injection 4 mg (4 mg Intravenous Given 03/06/21 1014)    ED Course/ Medical Decision Making/ A&P                           Medical Decision Making Patient with a 2-day history of nausea and vomiting, chronic dry cough currently on doxycycline and a hydrocodone cough syrup.  Had a near syncopal event while standing at work today.  No chest pain, no shortness of breath, also no abdominal pain, symptoms have been cough related.  She suspects she may be dehydrated.    Amount and/or  Complexity of Data Reviewed Labs: ordered.    Details: Labs and including a CBC, negative respiratory panel troponins x2, c-Met.  Her labs are significant for modest elevation in her LFTs with an AST of 148 and an ALT of 101, these are elevated in comparison to prior LFTs.  BNP is low elevated, clinically she does not have suggestion of CHF.  Her lipase is normal.  Regarding the LFTs, she denies excessive Tylenol use, no EtOH use.  No known infectious hepatitis, although hepatitis panel was added on today. Radiology: ordered.    Details: Chest x-ray is clear, no pneumonia.  Abdomen is completely benign no indication for abdominal imaging.  Risk Prescription drug management. Decision regarding hospitalization. Risk Details: Serial reexam's, patient's abdomen remains benign.  She was given IV fluids while here.  She is currently on day 3 of doxycycline, she was prescribed this secondary to her respiratory symptoms.  Doxycycline is potentially hepatotoxic, she was advised to discontinue this medication.  Her chest x-ray is clear, she does not need an antibiotic given today's exam.  She had no further nausea or vomiting while here after receiving IV Zofran.  She was given a p.o. challenge and tolerated this well and was symptom-free at time of discharge.  She was discharged home with prescription for further Zofran.  She was advised close follow-up with her PCP for recheck of her LFTs in 48 hours.  She mentions that she has a cardiology office visit scheduled in 2 days, perhaps they would add LFTs onto any blood work they may order. Patient was given strict return precautions.           Final Clinical Impression(s) / ED Diagnoses Final diagnoses:  Nausea and vomiting, unspecified vomiting type  Elevated transaminase level    Rx / DC Orders ED Discharge Orders          Ordered    ondansetron (ZOFRAN) 4 MG tablet  Every 6 hours        03/06/21 1511  Evalee Jefferson,  PA-C 03/07/21 6191    Sherwood Gambler, MD 03/09/21 [phone]

## 2021-03-06 NOTE — Discharge Instructions (Addendum)
Use the Zofran if needed for continued nausea or vomiting.  Make sure you are drinking plenty of fluids.  As discussed your liver enzymes are a little bit elevated today with your AST level at 148 and your ALT level at 101.  These elevated numbers are of unclear etiology, although can potentially be secondary to the doxycycline antibiotic.  Since your chest x-ray is clear today with no sign of pneumonia I recommend discontinuing this antibiotic.  Especially since you are not having any abdominal pain.  Avoid drinking any alcohol and consuming any Tylenol as these items can elevate your liver enzymes.  Plan to see your doctor for a recheck in 2 days for a lab visit to recheck these liver enzymes.

## 2021-03-06 NOTE — ED Triage Notes (Signed)
Patient with complaints of dizziness with nausea and vomiting that started last night.

## 2021-03-08 ENCOUNTER — Ambulatory Visit (INDEPENDENT_AMBULATORY_CARE_PROVIDER_SITE_OTHER): Payer: Medicare Other | Admitting: Cardiology

## 2021-03-08 ENCOUNTER — Encounter: Payer: Self-pay | Admitting: *Deleted

## 2021-03-08 ENCOUNTER — Encounter: Payer: Self-pay | Admitting: Cardiology

## 2021-03-08 VITALS — BP 120/80 | HR 44 | Ht 64.0 in | Wt 275.6 lb

## 2021-03-08 DIAGNOSIS — I2510 Atherosclerotic heart disease of native coronary artery without angina pectoris: Secondary | ICD-10-CM | POA: Diagnosis not present

## 2021-03-08 DIAGNOSIS — R600 Localized edema: Secondary | ICD-10-CM | POA: Diagnosis not present

## 2021-03-08 DIAGNOSIS — I4819 Other persistent atrial fibrillation: Secondary | ICD-10-CM

## 2021-03-08 DIAGNOSIS — I10 Essential (primary) hypertension: Secondary | ICD-10-CM

## 2021-03-08 NOTE — Patient Instructions (Addendum)

## 2021-03-08 NOTE — Progress Notes (Signed)
Clinical Summary Ms. Arthurs is a 66 y.o.female seen today for follow up of the following medical problems.   1.CAD -  history of DES to RCA 04/2019 in setting of NSTEMI - 04/2019 echo: LVEF 55-60%, no WMAs  - no recent chest pains - compliant with meds. She is on plavix for recent lower extremity intervention,remains on xarelto for afib   2.Aortic stenosis -04/2019 echo mean grad 16, AVA VTI 1.4  3.Afib - followed by EP, has been on dofetilide - no recent palpitations - no recent presyncope, or syncope   4.PAD - followed by vascular - 09/2020 balloon  left PT, mechanical thrombectomy prox left popliteal, balloon left pop, stent prox left pop   5. Fatigue - has not been interested in sleep sudy    6. Abnormal PFTs - followed by pulmonary Dr Halford Chessman. Started on inhalers recently after abnormal PFTs thought to be asthma    7. LE edema - 2021 echo LVEF 79-39, indet diastolic function. Prior DVT left leg - has been evaluated by vascular -likely related to obesity, perhaps venous insufficiency. Previous echo fairly mild findings - ongoing edema.  - not able to get compression stocking on     8. Hyperlipidemia - upcoming labs with pcp  9. HTN  10. N/V - ER visit Jan 24 - LFTs were elevated - recently was on abx, thought may have been the cause.  Past Medical History:  Diagnosis Date   Arthritis    Asthma    Cancer ([place])    multiple skin cancers   Diabetes mellitus without complication ([place])    Diverticulosis    Fluid retention    GERD (gastroesophageal reflux disease)    High cholesterol    Hypertension    Incomplete rotator cuff tear    Myocardial infarction ([facility])    Obesity    Persistent atrial fibrillation ([facility])    PONV (postoperative nausea and vomiting)    Wears glasses    Wears partial dentures    bottom     Allergies  Allergen Reactions   Penicillins Hives and Rash    Has patient had a PCN reaction causing immediate rash,  facial/tongue/throat swelling, SOB or lightheadedness with hypotension: Yes Has patient had a PCN reaction causing severe rash involving mucus membranes or skin necrosis: No Has patient had a PCN reaction that required hospitalization No Has patient had a PCN reaction occurring within the last 10 years: No If all of the above answers are "NO", then may proceed with Cephalosporin use.      Current Outpatient Medications  Medication Sig Dispense Refill   acetaminophen (TYLENOL) 500 MG tablet Take 500 mg by mouth every 6 (six) hours as needed for mild pain or headache.     albuterol (VENTOLIN HFA) 108 (90 Base) MCG/ACT inhaler Inhale 2 puffs into the lungs every 6 (six) hours as needed for wheezing or shortness of breath.     atorvastatin (LIPITOR) 80 MG tablet TAKE 1 TABLET BY MOUTH DAILY AT 6 PM. 90 tablet 2   BD PEN NEEDLE NANO U/F 32G X 4 MM MISC USE FOR [place] INJECTIONS     cetirizine (ZYRTEC) 10 MG tablet Take 10 mg by mouth at bedtime.     clopidogrel (PLAVIX) 75 MG tablet TAKE 1 TABLET (75 MG TOTAL) BY MOUTH DAILY WITH BREAKFAST. 90 tablet 3   dofetilide (TIKOSYN) 250 MCG capsule TAKE 1 CAPSULE BY MOUTH 2 TIMES DAILY. 180 capsule 2   doxycycline (VIBRAMYCIN) 100  MG capsule Take 100 mg by mouth 2 (two) times daily.     fluticasone (FLONASE) 50 MCG/ACT nasal spray Place 2 sprays into both nostrils daily as needed for rhinitis (congestion/ dryness).     HYDROcodone bit-homatropine (HYCODAN) 5-1.5 MG/5ML syrup Take 5 mLs by mouth every 4 (four) hours.     JARDIANCE 10 MG TABS tablet Take 10 mg by mouth daily.     losartan (COZAAR) 100 MG tablet Take 100 mg by mouth daily with breakfast.      nitroGLYCERIN (NITROSTAT) 0.4 MG SL tablet Place 1 tablet (0.4 mg total) under the tongue every 5 (five) minutes as needed for chest pain. 30 tablet 0   ondansetron (ZOFRAN) 4 MG tablet Take 1 tablet (4 mg total) by mouth every 6 (six) hours. 12 tablet 0   potassium chloride SA (KLOR-CON) 20 MEQ  tablet Take 1 tablet (20 mEq total) by mouth 2 (two) times daily.     spironolactone (ALDACTONE) 25 MG tablet Take 25 mg by mouth daily with breakfast.      torsemide (DEMADEX) 20 MG tablet Take 60 mg by mouth daily after breakfast.      VICTOZA 18 MG/3ML SOPN Inject 1.8 mg into the skin at bedtime.      VITAMIN D PO Take 2 capsules by mouth daily.     XARELTO 20 MG TABS tablet TAKE 1 TABLET BY MOUTH EVERYDAY AT BEDTIME 30 tablet 2   No current facility-administered medications for this visit.     Past Surgical History:  Procedure Laterality Date   APPENDECTOMY     CARDIOVERSION N/A 06/03/2019   Procedure: CARDIOVERSION;  Surgeon: Herminio Commons, MD;  Location: [facility];  Service: Cardiovascular;  Laterality: N/A;   CARDIOVERSION N/A 07/13/2019   Procedure: CARDIOVERSION;  Surgeon: Geralynn Rile, MD;  Location: [place];  Service: Cardiovascular;  Laterality: N/A;   CARPAL TUNNEL RELEASE Bilateral 2001   bilateral   CERVICAL POLYPECTOMY  04/08/2017   Procedure: POLYPECTOMY;  Surgeon: [name], MD;  Location: [facility];  Service: Gynecology;;  Endometrial   CHOLECYSTECTOMY     COLONOSCOPY  2007   HCW:CBJSEGB internal hemorrhoids. Diminutive rectal polyp at 10 cm, cold biopsied/removed. The remainder of the rectal mucosa appeared normal Swallow left-sided diverticula. Diminutive polyp at the splenic flexure cold biopsied/removed (adenomatous)   COLONOSCOPY N/A 12/28/2013   Procedure: COLONOSCOPY;  Surgeon: Daneil Dolin, MD;  Location: [facility];  Service: Endoscopy;  Laterality: N/A;  730 - moved to 8:30 - Ginger notified pt   CORONARY STENT INTERVENTION N/A 04/27/2019   Procedure: CORONARY STENT INTERVENTION;  Surgeon: Troy Sine, MD;  Location: [place] [facility];  Service: Cardiovascular;  Laterality: N/A;   DILATION AND CURETTAGE, DIAGNOSTIC / THERAPEUTIC  03/07/2017   ERCP  2002   GASTRIC BYPASS  2004   HYSTEROSCOPY WITH D & C N/A 04/08/2017   Procedure:  DILATATION AND CURETTAGE /HYSTEROSCOPY;  Surgeon: [name], MD;  Location: [facility];  Service: Gynecology;  Laterality: N/A;   INTRAVASCULAR ULTRASOUND/IVUS N/A 09/12/2017   Procedure: INTRAVASCULAR ULTRASOUND/IVUS;  Surgeon: Elam Dutch, MD;  Location: [place] [facility];  Service: Cardiovascular;  Laterality: N/A;   LEFT HEART CATH AND CORONARY ANGIOGRAPHY N/A 04/27/2019   Procedure: LEFT HEART CATH AND CORONARY ANGIOGRAPHY;  Surgeon: Troy Sine, MD;  Location: [place] [facility];  Service: Cardiovascular;  Laterality: N/A;   LOWER EXTREMITY ANGIOGRAPHY Left 10/02/2020   Procedure: LOWER EXTREMITY ANGIOGRAPHY;  Surgeon: Algernon Huxley, MD;  Location: [place] [facility];  Service: Cardiovascular;  Laterality: Left;   NECK SURGERY  1998   fusion   PERCUTANEOUS VENOUS THROMBECTOMY,LYSIS WITH INTRAVASCULAR ULTRASOUND (IVUS) Left 09/13/2017   Procedure: REMOVAL OF LYSIS CATHETER AND INTRAVASCULAR ULTRASOUND (IVUS) LEFT ILIAC VEIN;  Surgeon: Elam Dutch, MD;  Location: [place];  Service: Vascular;  Laterality: Left;   PERIPHERAL VASCULAR INTERVENTION  09/15/2017   Procedure: PERIPHERAL VASCULAR INTERVENTION;  Surgeon: Waynetta Sandy, MD;  Location: [place] [facility];  Service: Cardiovascular;;  VEINOUS/STENT   PERIPHERAL VASCULAR THROMBECTOMY Left 09/12/2017   Procedure: PERIPHERAL VASCULAR THROMBECTOMY;  Surgeon: Elam Dutch, MD;  Location: [place] [facility];  Service: Cardiovascular;  Laterality: Left;   PERIPHERAL VASCULAR THROMBECTOMY Left 09/15/2017   Procedure: PERIPHERAL VASCULAR THROMBECTOMY;  Surgeon: Waynetta Sandy, MD;  Location: [place] [facility];  Service: Cardiovascular;  Laterality: Left;  IVC TO LT FEM/POP VEIN   SHOULDER ARTHROSCOPY WITH SUBACROMIAL DECOMPRESSION Left 06/23/2013   Procedure: LEFT SHOULDER ARTHROSCOPY WITH DEBRIDEMENT ROTATOR CUFF AND LABRUM;  Surgeon: Lorn Junes, MD;  Location: [place];  Service:  Orthopedics;  Laterality: Left;   SHOULDER SURGERY  1999   left     Allergies  Allergen Reactions   Penicillins Hives and Rash    Has patient had a PCN reaction causing immediate rash, facial/tongue/throat swelling, SOB or lightheadedness with hypotension: Yes Has patient had a PCN reaction causing severe rash involving mucus membranes or skin necrosis: No Has patient had a PCN reaction that required hospitalization No Has patient had a PCN reaction occurring within the last 10 years: No If all of the above answers are "NO", then may proceed with Cephalosporin use.       Family History  Problem Relation Age of Onset   Diabetes Father    Hypertension Father    Parkinson's disease Father    Cancer Father        not sure what kind   Hypertension Mother    Heart disease Mother    Drug abuse Sister    Mesothelioma Brother    Cancer Daughter        non-hodgkins lmphoma   Hypertension Brother    Diabetes Brother    Colon cancer Neg Hx    Gastric cancer Neg Hx    Esophageal cancer Neg Hx      Social History Ms. Giampietro reports that she has been smoking cigarettes. She started smoking about 50 years ago. She has a 8.70 pack-year smoking history. She has never used smokeless tobacco. Ms. Purdum reports that she does not currently use alcohol.   Review of Systems CONSTITUTIONAL: No weight loss, fever, chills, weakness or fatigue.  HEENT: Eyes: No visual loss, blurred vision, double vision or yellow sclerae.No hearing loss, sneezing, congestion, runny nose or sore throat.  SKIN: No rash or itching.  CARDIOVASCULAR: per hpi RESPIRATORY: No shortness of breath, cough or sputum.  GASTROINTESTINAL: No anorexia, nausea, vomiting or diarrhea. No abdominal pain or blood.  GENITOURINARY: No burning on urination, no polyuria NEUROLOGICAL: No headache, dizziness, syncope, paralysis, ataxia, numbness or tingling in the extremities. No change in bowel or bladder control.   MUSCULOSKELETAL: No muscle, back pain, joint pain or stiffness.  LYMPHATICS: No enlarged nodes. No history of splenectomy.  PSYCHIATRIC: No history of depression or anxiety.  ENDOCRINOLOGIC: No reports of sweating, cold or heat intolerance. No polyuria or polydipsia.  Marland Kitchen   Physical Examination  Today's Vitals   03/08/21 1013  BP: 120/80  Pulse: (!) 44  SpO2: 98%  Weight: 275 lb 9.6 oz (125 kg)  Height: 5\' 4"  (1.626 m)   Body mass index is 47.31 kg/m.  Gen: resting comfortably, no acute distress HEENT: no scleral icterus, pupils equal round and reactive, no palptable cervical adenopathy,  CV: brady, 2/6 systolic murmur rusb, no jvd Resp: Clear to auscultation bilaterally GI: abdomen is soft, non-tender, non-distended, normal bowel sounds, no hepatosplenomegaly MSK: extremities are warm, no edema.  Skin: warm, no rash Neuro:  no focal deficits Psych: appropriate affect   Diagnostic Studies  01/2016 echo Study Conclusions   - Left ventricle: The cavity size was mildly dilated. Wall   thickness was normal. Systolic function was normal. The estimated   ejection fraction was in the range of 60% to 65%. The study is   not technically sufficient to allow evaluation of LV diastolic   function. - Pulmonary arteries: PA peak pressure: 32 mm Hg (S). - Pericardium, extracardiac: A trivial pericardial effusion was   identified.   04/2016 Lexiscan There was no ST segment deviation noted during stress. The study is normal. There are no perfusion defects consistent with prior infarct or current ischemia. Anterior fixed defect likely causes by breast attenuation. This is a low risk study. The left ventricular ejection fraction is normal (55-65%).   04/2019 cath Prox RCA lesion is 95% stenosed. Post intervention, there is a 0% residual stenosis. A stent was successfully placed.   Acute coronary syndrome secondary to high-grade thrombotic stenosis in the very proximal RCA in a  dominant RCA vessel.   Normal left coronary circulation with a short left main, large LAD and left circumflex vessels.   Dominant RCA with 95% very proximal stenosis with thrombus burden with TIMI-3 flow.   Successful percutaneous coronary prevention with PTCA and ultimate stenting with a 3.0 x18 mm Resolute Onyx DES stent postdilated to 3.73 mm tapering to 3.68 mm with the percent stenosis being reduced to 0% and brisk TIMI-3 flow without evidence for dissection.   RECOMMENDATION: Consider initial triple drug therapy for 1 month with continuation of Plavix/Xarelto for at least 6 months in this patient on long-term anticoagulation therapy.   08/2018 sleep study Mild obstructive sleep apnea is documented with this recording.  The severity does not require positive pressure treatment.   Assessment and Plan  1.CAD - no recent symptoms - on xarelto for afib and plavix for recent lower extremity intervention, not for cardiac indication - no beta blocker due to bradycardia - continue current meds     2. HTN - at goal, conttinue currnet meds   3. LE edema - likely related to obesity and venous insufficiency, fairly benign echo  - continue diuretic        Arnoldo Lenis, M.D.

## 2021-03-12 ENCOUNTER — Encounter: Payer: Self-pay | Admitting: Cardiology

## 2021-03-12 DIAGNOSIS — R945 Abnormal results of liver function studies: Secondary | ICD-10-CM | POA: Diagnosis not present

## 2021-03-12 DIAGNOSIS — E1129 Type 2 diabetes mellitus with other diabetic kidney complication: Secondary | ICD-10-CM | POA: Diagnosis not present

## 2021-03-13 DIAGNOSIS — R7309 Other abnormal glucose: Secondary | ICD-10-CM | POA: Diagnosis not present

## 2021-03-13 DIAGNOSIS — I10 Essential (primary) hypertension: Secondary | ICD-10-CM | POA: Diagnosis not present

## 2021-03-13 DIAGNOSIS — R7989 Other specified abnormal findings of blood chemistry: Secondary | ICD-10-CM | POA: Diagnosis not present

## 2021-03-13 DIAGNOSIS — E1122 Type 2 diabetes mellitus with diabetic chronic kidney disease: Secondary | ICD-10-CM | POA: Diagnosis not present

## 2021-03-21 ENCOUNTER — Other Ambulatory Visit: Payer: Self-pay

## 2021-03-21 ENCOUNTER — Inpatient Hospital Stay (HOSPITAL_COMMUNITY): Payer: Medicare Other | Attending: Hematology

## 2021-03-21 DIAGNOSIS — M79605 Pain in left leg: Secondary | ICD-10-CM | POA: Diagnosis not present

## 2021-03-21 DIAGNOSIS — Z79899 Other long term (current) drug therapy: Secondary | ICD-10-CM | POA: Insufficient documentation

## 2021-03-21 DIAGNOSIS — I10 Essential (primary) hypertension: Secondary | ICD-10-CM | POA: Diagnosis not present

## 2021-03-21 DIAGNOSIS — Z833 Family history of diabetes mellitus: Secondary | ICD-10-CM | POA: Diagnosis not present

## 2021-03-21 DIAGNOSIS — Z814 Family history of other substance abuse and dependence: Secondary | ICD-10-CM | POA: Diagnosis not present

## 2021-03-21 DIAGNOSIS — F17210 Nicotine dependence, cigarettes, uncomplicated: Secondary | ICD-10-CM | POA: Diagnosis not present

## 2021-03-21 DIAGNOSIS — R519 Headache, unspecified: Secondary | ICD-10-CM | POA: Insufficient documentation

## 2021-03-21 DIAGNOSIS — Z8249 Family history of ischemic heart disease and other diseases of the circulatory system: Secondary | ICD-10-CM | POA: Diagnosis not present

## 2021-03-21 DIAGNOSIS — Z808 Family history of malignant neoplasm of other organs or systems: Secondary | ICD-10-CM | POA: Diagnosis not present

## 2021-03-21 DIAGNOSIS — Z818 Family history of other mental and behavioral disorders: Secondary | ICD-10-CM | POA: Diagnosis not present

## 2021-03-21 DIAGNOSIS — D509 Iron deficiency anemia, unspecified: Secondary | ICD-10-CM | POA: Insufficient documentation

## 2021-03-21 DIAGNOSIS — R059 Cough, unspecified: Secondary | ICD-10-CM | POA: Diagnosis not present

## 2021-03-21 DIAGNOSIS — Z809 Family history of malignant neoplasm, unspecified: Secondary | ICD-10-CM | POA: Insufficient documentation

## 2021-03-21 DIAGNOSIS — R718 Other abnormality of red blood cells: Secondary | ICD-10-CM

## 2021-03-21 DIAGNOSIS — R5383 Other fatigue: Secondary | ICD-10-CM | POA: Diagnosis not present

## 2021-03-21 LAB — CBC WITH DIFFERENTIAL/PLATELET
Abs Immature Granulocytes: 0.03 10*3/uL (ref 0.00–0.07)
Basophils Absolute: 0 10*3/uL (ref 0.0–0.1)
Basophils Relative: 0 %
Eosinophils Absolute: 0.1 10*3/uL (ref 0.0–0.5)
Eosinophils Relative: 2 %
HCT: 44 % (ref 36.0–46.0)
Hemoglobin: 14.4 g/dL (ref 12.0–15.0)
Immature Granulocytes: 0 %
Lymphocytes Relative: 23 %
Lymphs Abs: 2.1 10*3/uL (ref 0.7–4.0)
MCH: 29.6 pg (ref 26.0–34.0)
MCHC: 32.7 g/dL (ref 30.0–36.0)
MCV: 90.3 fL (ref 80.0–100.0)
Monocytes Absolute: 0.9 10*3/uL (ref 0.1–1.0)
Monocytes Relative: 10 %
Neutro Abs: 5.7 10*3/uL (ref 1.7–7.7)
Neutrophils Relative %: 65 %
Platelets: 278 10*3/uL (ref 150–400)
RBC: 4.87 MIL/uL (ref 3.87–5.11)
RDW: 17.9 % — ABNORMAL HIGH (ref 11.5–15.5)
WBC: 8.9 10*3/uL (ref 4.0–10.5)
nRBC: 0 % (ref 0.0–0.2)

## 2021-03-21 LAB — RETICULOCYTES
Immature Retic Fract: 15.7 % (ref 2.3–15.9)
RBC.: 5.01 MIL/uL (ref 3.87–5.11)
Retic Count, Absolute: 74.1 10*3/uL (ref 19.0–186.0)
Retic Ct Pct: 1.5 % (ref 0.4–3.1)

## 2021-03-21 LAB — VITAMIN B12: Vitamin B-12: 382 pg/mL (ref 180–914)

## 2021-03-21 LAB — IRON AND TIBC
Iron: 49 ug/dL (ref 28–170)
Saturation Ratios: 11 % (ref 10.4–31.8)
TIBC: 435 ug/dL (ref 250–450)
UIBC: 386 ug/dL

## 2021-03-21 LAB — LACTATE DEHYDROGENASE: LDH: 134 U/L (ref 98–192)

## 2021-03-21 LAB — FOLATE: Folate: 10.5 ng/mL (ref >5.9)

## 2021-03-21 LAB — FERRITIN: Ferritin: 50 ng/mL (ref 11–307)

## 2021-03-21 NOTE — Progress Notes (Signed)
Results will be reviewed at [place] on 2/15

## 2021-03-22 DIAGNOSIS — H40013 Open angle with borderline findings, low risk, bilateral: Secondary | ICD-10-CM | POA: Diagnosis not present

## 2021-03-23 LAB — PROTEIN ELECTROPHORESIS, SERUM
A/G Ratio: 1.1 (ref 0.7–1.7)
Albumin ELP: 3.7 g/dL (ref 2.9–4.4)
Alpha-1-Globulin: 0.2 g/dL (ref 0.0–0.4)
Alpha-2-Globulin: 0.9 g/dL (ref 0.4–1.0)
Beta Globulin: 1.2 g/dL (ref 0.7–1.3)
Gamma Globulin: 1.1 g/dL (ref 0.4–1.8)
Globulin, Total: 3.4 g/dL (ref 2.2–3.9)
Total Protein ELP: 7.1 g/dL (ref 6.0–8.5)

## 2021-03-27 NOTE — Progress Notes (Addendum)
[place], [place] [address]   CLINIC:  Medical Oncology/Hematology  PCP:  Asencion Noble, MD [address] [place] [address] [phone]   REASON FOR VISIT:  Follow-up for iron deficiency anemia  PRIOR THERAPY: Oral iron tablets  CURRENT THERAPY: Intermittent IV iron infusions  INTERVAL HISTORY:  Amy Tran 66 y.o. female returns for routine follow-up of her iron deficiency anemia.  She was seen for initial consultation by Dr. Delton Coombes on 12/20/2020 and received Feraheme x2 (12/22/2020 & 12/29/2020).  At today's visit, she reports feeling fair.  No recent hospitalizations, surgeries, or changes in baseline health status.  Prior to receiving IV iron, she complained of significant fatigue, and reports that she did not feel any improvement after IV iron.  She continues to have pica cravings for ice.  She has not noted any bleeding such as hematemesis, hematochezia, melena, epistaxis, or hematuria.  She does note that she bruises easily from being on Xarelto and Plavix.  She has chronic intermittent tension headaches.  She denies any chest pain, dyspnea on exertion, lightheadedness, or syncope.  She does note that she has some recurrent left leg pain and claudication symptoms, and notes that she is followed by vascular specialist due to extensive peripheral arterial and peripheral venous disease.  She has little to no energy and 50% appetite. She endorses that she is maintaining a stable weight.   REVIEW OF SYSTEMS:  Review of Systems  Constitutional:  Positive for fatigue. Negative for appetite change, chills, diaphoresis, fever and unexpected weight change.  HENT:   Negative for lump/mass and nosebleeds.   Eyes:  Negative for eye problems.  Respiratory:  Positive for cough (sinus drainage). Negative for hemoptysis and shortness of breath.   Cardiovascular:  Positive for leg swelling. Negative for chest pain and palpitations.   Gastrointestinal:  Negative for abdominal pain, blood in stool, constipation, diarrhea, nausea and vomiting.  Genitourinary:  Negative for hematuria.   Skin: Negative.   Neurological:  Positive for headaches. Negative for dizziness and light-headedness.  Hematological:  Does not bruise/bleed easily.     PAST MEDICAL/SURGICAL HISTORY:  Past Medical History:  Diagnosis Date   Arthritis    Asthma    Cancer ([facility])    multiple skin cancers   Diabetes mellitus without complication ([facility])    Diverticulosis    Fluid retention    GERD (gastroesophageal reflux disease)    High cholesterol    Hypertension    Incomplete rotator cuff tear    Myocardial infarction ([facility])    Obesity    Persistent atrial fibrillation ([facility])    PONV (postoperative nausea and vomiting)    Wears glasses    Wears partial dentures    bottom   Past Surgical History:  Procedure Laterality Date   APPENDECTOMY     CARDIOVERSION N/A 06/03/2019   Procedure: CARDIOVERSION;  Surgeon: Herminio Commons, MD;  Location: [facility];  Service: Cardiovascular;  Laterality: N/A;   CARDIOVERSION N/A 07/13/2019   Procedure: CARDIOVERSION;  Surgeon: Geralynn Rile, MD;  Location: [facility];  Service: Cardiovascular;  Laterality: N/A;   CARPAL TUNNEL RELEASE Bilateral 2001   bilateral   CERVICAL POLYPECTOMY  04/08/2017   Procedure: POLYPECTOMY;  Surgeon: [name], MD;  Location: [facility];  Service: Gynecology;;  Endometrial   CHOLECYSTECTOMY     COLONOSCOPY  2007   VFI:EPPIRJJ internal hemorrhoids. Diminutive rectal polyp at 10 cm, cold biopsied/removed. The remainder of the rectal mucosa  appeared normal Swallow left-sided diverticula. Diminutive polyp at the splenic flexure cold biopsied/removed (adenomatous)   COLONOSCOPY N/A 12/28/2013   Procedure: COLONOSCOPY;  Surgeon: Daneil Dolin, MD;  [facility];  Service: Endoscopy;  Laterality: N/A;  730 - moved to 8:30 - Ginger notified pt   CORONARY STENT  INTERVENTION N/A 04/27/2019   Procedure: CORONARY STENT INTERVENTION;  Surgeon: Troy Sine, MD;  Location: [facility];  Service: Cardiovascular;  Laterality: N/A;   DILATION AND CURETTAGE, DIAGNOSTIC / THERAPEUTIC  03/07/2017   ERCP  2002   GASTRIC BYPASS  2004   HYSTEROSCOPY WITH D & C N/A 04/08/2017   Procedure: DILATATION AND CURETTAGE /HYSTEROSCOPY;  Surgeon: [name], MD;  Location: [facility];  Service: Gynecology;  Laterality: N/A;   INTRAVASCULAR ULTRASOUND/IVUS N/A 09/12/2017   Procedure: INTRAVASCULAR ULTRASOUND/IVUS;  Surgeon: Elam Dutch, MD;  Location: [facility];  Service: Cardiovascular;  Laterality: N/A;   LEFT HEART CATH AND CORONARY ANGIOGRAPHY N/A 04/27/2019   Procedure: LEFT HEART CATH AND CORONARY ANGIOGRAPHY;  Surgeon: Troy Sine, MD;  Location: [facility];  Service: Cardiovascular;  Laterality: N/A;   LOWER EXTREMITY ANGIOGRAPHY Left 10/02/2020   Procedure: LOWER EXTREMITY ANGIOGRAPHY;  Surgeon: Algernon Huxley, MD;  Location: [facility];  Service: Cardiovascular;  Laterality: Left;   NECK SURGERY  1998   fusion   PERCUTANEOUS VENOUS THROMBECTOMY,LYSIS WITH INTRAVASCULAR ULTRASOUND (IVUS) Left 09/13/2017   Procedure: REMOVAL OF LYSIS CATHETER AND INTRAVASCULAR ULTRASOUND (IVUS) LEFT ILIAC VEIN;  Surgeon: Elam Dutch, MD;  Location: [facility];  Service: Vascular;  Laterality: Left;   PERIPHERAL VASCULAR INTERVENTION  09/15/2017   Procedure: PERIPHERAL VASCULAR INTERVENTION;  Surgeon: Waynetta Sandy, MD;  Location: [facility];  Service: Cardiovascular;;  VEINOUS/STENT   PERIPHERAL VASCULAR THROMBECTOMY Left 09/12/2017   Procedure: PERIPHERAL VASCULAR THROMBECTOMY;  Surgeon: Elam Dutch, MD;  Location: [facility];  Service: Cardiovascular;  Laterality: Left;   PERIPHERAL VASCULAR THROMBECTOMY Left 09/15/2017   Procedure: PERIPHERAL VASCULAR THROMBECTOMY;  Surgeon: Waynetta Sandy, MD;   Location: [facility];  Service: Cardiovascular;  Laterality: Left;  IVC TO LT FEM/POP VEIN   SHOULDER ARTHROSCOPY WITH SUBACROMIAL DECOMPRESSION Left 06/23/2013   Procedure: LEFT SHOULDER ARTHROSCOPY WITH DEBRIDEMENT ROTATOR CUFF AND LABRUM;  Surgeon: Lorn Junes, MD;  Location: [employer] [facility];  Service: Orthopedics;  Laterality: Left;   SHOULDER SURGERY  1999   left     SOCIAL HISTORY:  Social History   Socioeconomic History   Marital status: Widowed    Spouse name: Not on file   Number of children: 2   Years of education: Not on file   Highest education level: Not on file  Occupational History   Occupation: works at [employer] store    Employer: [employer]   Tobacco Use   Smoking status: Every Day    Packs/day: 0.30    Years: 29.00    Pack years: 8.70    Types: Cigarettes    Start date: 04/09/1970   Smokeless tobacco: Never   Tobacco comments:    09/04/20 - states she is down to 9 ciggs / day   Vaping Use   Vaping Use: Never used  Substance and Sexual Activity   Alcohol use: Not Currently   Drug use: No   Sexual activity: Not Currently    Birth control/protection: Post-menopausal  Other Topics Concern   Not on file  Social History Narrative   Not on file   Social Determinants of Health   Financial Resource Strain: Not on file  Food Insecurity: Not on file  Transportation Needs: Not on file  Physical Activity: Not on file  Stress: Not on file  Social Connections: Not on file  Intimate Partner Violence: Not on file    FAMILY HISTORY:  Family History  Problem Relation Age of Onset   Diabetes Father    Hypertension Father    Parkinson's disease Father    Cancer Father        not sure what kind   Hypertension Mother    Heart disease Mother    Drug abuse Sister    Mesothelioma Brother    Cancer Daughter        non-hodgkins lmphoma   Hypertension Brother    Diabetes Brother    Colon cancer Neg Hx    Gastric cancer Neg Hx    Esophageal cancer Neg  Hx     CURRENT MEDICATIONS:  Outpatient Encounter Medications as of 03/28/2021  Medication Sig   acetaminophen (TYLENOL) 500 MG tablet Take 500 mg by mouth every 6 (six) hours as needed for mild pain or headache. (Patient not taking: Reported on 03/08/2021)   albuterol (VENTOLIN HFA) 108 (90 Base) MCG/ACT inhaler Inhale 2 puffs into the lungs every 6 (six) hours as needed for wheezing or shortness of breath.   atorvastatin (LIPITOR) 80 MG tablet TAKE 1 TABLET BY MOUTH DAILY AT 6 PM.   BD PEN NEEDLE NANO U/F 32G X 4 MM MISC USE FOR VICTOZA INJECTIONS   cetirizine (ZYRTEC) 10 MG tablet Take 10 mg by mouth at bedtime. (Patient not taking: Reported on 03/08/2021)   clopidogrel (PLAVIX) 75 MG tablet TAKE 1 TABLET (75 MG TOTAL) BY MOUTH DAILY WITH BREAKFAST.   dofetilide (TIKOSYN) 250 MCG capsule TAKE 1 CAPSULE BY MOUTH 2 TIMES DAILY.   fluticasone (FLONASE) 50 MCG/ACT nasal spray Place 2 sprays into both nostrils daily as needed for rhinitis (congestion/ dryness).   HYDROcodone bit-homatropine (HYCODAN) 5-1.5 MG/5ML syrup Take 5 mLs by mouth every 4 (four) hours. (Patient not taking: Reported on 03/08/2021)   JARDIANCE 10 MG TABS tablet Take 10 mg by mouth daily.   losartan (COZAAR) 100 MG tablet Take 100 mg by mouth daily with breakfast.    nitroGLYCERIN (NITROSTAT) 0.4 MG SL tablet Place 1 tablet (0.4 mg total) under the tongue every 5 (five) minutes as needed for chest pain.   ondansetron (ZOFRAN) 4 MG tablet Take 1 tablet (4 mg total) by mouth every 6 (six) hours.   potassium chloride SA (KLOR-CON) 20 MEQ tablet Take 1 tablet (20 mEq total) by mouth 2 (two) times daily.   spironolactone (ALDACTONE) 25 MG tablet Take 25 mg by mouth daily with breakfast.    torsemide (DEMADEX) 20 MG tablet Take 60 mg by mouth daily after breakfast.    VICTOZA 18 MG/3ML SOPN Inject 1.8 mg into the skin at bedtime.    VITAMIN D PO Take 2 capsules by mouth daily.   XARELTO 20 MG TABS tablet TAKE 1 TABLET BY MOUTH  EVERYDAY AT BEDTIME   No facility-administered encounter medications on file as of 03/28/2021.    ALLERGIES:  Allergies  Allergen Reactions   Penicillins Hives and Rash    Has patient had a PCN reaction causing immediate rash, facial/tongue/throat swelling, SOB or lightheadedness with hypotension: Yes Has patient had a PCN reaction causing severe rash involving mucus membranes or skin  necrosis: No Has patient had a PCN reaction that required hospitalization No Has patient had a PCN reaction occurring within the last 10 years: No If all of the above answers are "NO", then may proceed with Cephalosporin use.    Doxycycline Itching     PHYSICAL EXAM:  ECOG PERFORMANCE STATUS: 2 - Symptomatic, <50% confined to bed  There were no vitals filed for this visit. There were no vitals filed for this visit. Physical Exam Constitutional:      Appearance: Normal appearance. She is morbidly obese.  HENT:     Head: Normocephalic and atraumatic.     Mouth/Throat:     Mouth: Mucous membranes are moist.  Eyes:     Extraocular Movements: Extraocular movements intact.     Pupils: Pupils are equal, round, and reactive to light.  Cardiovascular:     Rate and Rhythm: Regular rhythm. Bradycardia present.     Pulses: Normal pulses.     Heart sounds: Murmur heard.  Pulmonary:     Effort: Pulmonary effort is normal.     Breath sounds: Normal breath sounds.  Abdominal:     General: Bowel sounds are normal.     Palpations: Abdomen is soft.     Tenderness: There is no abdominal tenderness.  Musculoskeletal:        General: No swelling.     Right lower leg: Edema present.     Left lower leg: Edema present.  Lymphadenopathy:     Cervical: No cervical adenopathy.  Skin:    General: Skin is warm and dry.  Neurological:     General: No focal deficit present.     Mental Status: She is alert and oriented to person, place, and time.  Psychiatric:        Mood and Affect: Mood normal.         Behavior: Behavior normal.     LABORATORY DATA:  I have reviewed the labs as listed.  CBC    Component Value Date/Time   WBC 8.9 03/21/2021 1048   RBC 5.01 03/21/2021 1049   RBC 4.87 03/21/2021 1048   HGB 14.4 03/21/2021 1048   HGB 12.2 06/02/2020 0946   HCT 44.0 03/21/2021 1048   HCT 38.0 06/02/2020 0946   PLT 278 03/21/2021 1048   PLT 329 06/02/2020 0946   MCV 90.3 03/21/2021 1048   MCV 83 06/02/2020 0946   MCH 29.6 03/21/2021 1048   MCHC 32.7 03/21/2021 1048   RDW 17.9 (H) 03/21/2021 1048   RDW 14.4 06/02/2020 0946   LYMPHSABS 2.1 03/21/2021 1048   MONOABS 0.9 03/21/2021 1048   EOSABS 0.1 03/21/2021 1048   BASOSABS 0.0 03/21/2021 1048   CMP Latest Ref Rng & Units 03/06/2021 10/02/2020 02/10/2020  Glucose 70 - 99 mg/dL 157(H) - 146(H)  BUN 8 - 23 mg/dL 11 13 14   Creatinine 0.44 - 1.00 mg/dL 0.70 0.74 0.81  Sodium 135 - 145 mmol/L 134(L) - 134(L)  Potassium 3.5 - 5.1 mmol/L 3.7 - 4.0  Chloride 98 - 111 mmol/L 103 - 100  CO2 22 - 32 mmol/L 26 - 25  Calcium 8.9 - 10.3 mg/dL 9.1 - 8.9  Total Protein 6.5 - 8.1 g/dL 7.6 - -  Total Bilirubin 0.3 - 1.2 mg/dL 1.2 - -  Alkaline Phos 38 - 126 U/L 95 - -  AST 15 - 41 U/L 148(H) - -  ALT 0 - 44 U/L 101(H) - -    DIAGNOSTIC IMAGING:  I have independently reviewed the  relevant imaging and discussed with the patient.  ASSESSMENT & PLAN: 1.  Iron deficiency state: - Patient seen at the request of Dr. Willey Blade for further management of iron deficiency state. - Labs from 12/11/2020 showed hemoglobin 12.4, MCV 78, low MCH and MCHC and high RDW consistent with iron deficiency state.  Ferritin was 22. - She could not tolerate iron tablets in the past which caused diarrhea.  Denies any prior history of transfusion. - Last colonoscopy on 12/28/2013: Colonic diverticulosis. - She is on Xarelto and Plavix for A. fib and peripheral vascular disease. - Additional work-up revealed normal vitamin B12, folate, LDH, SPEP, and reticulocytes  -  She complained of extreme fatigue prior to IV iron infusions, but this improved after Feraheme x2 (12/22/2020 & 12/29/2020) - Symptomatic with residual fatigue and pica cravings for ice - No bright red blood per rectum or melena - Most recent labs (03/21/2021): Hgb 14.4, ferritin 50, iron saturation 11% - PLAN: Due to persistent symptomatic iron deficiency (without anemia) recommend additional IV Feraheme x2.   - Repeat labs and RTC in 3 months.  2.  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 - She is known to have extensive peripheral vascular disease, and is followed by vascular surgery. - She has had leg claudication symptoms related to atherosclerosis of the native artery of the left leg with rest pain. - On 10/02/2020 she had left leg balloon angioplasty of left posterior tibial artery, mechanical thrombectomy of the proximal left popliteal artery, and balloon angioplasty of proximal left popliteal artery, and stent placement to proximal left popliteal artery. - Most recently seen by NP Eulogio Ditch ([facility]) on 10/25/2020 - She is taking Xarelto and Plavix  - She notes some recurrent left leg pain and claudication today, states that she has plans to contact her vascular surgeon today. - Exam shows chronic lymphedema of left leg with left calf circumference markedly larger than right leg.  Patient states that this is chronic and unchanged. - PLAN: Patient instructed to continue Xarelto and to promptly reach out to her established vein and vascular specialists.  3. Social/family history: - Lives at home with her daughter.  Independent of ADLs and  IADLs.  She works as a Environmental education officer at [employer].  She started smoking at age 32, 2 to 3 packs/day and quit for 16 years.  Restarted back again 5 years back when her husband died.  She is currently smoking 10 cigarettes/day now. - Father, maternal grandfather and maternal grandmother had cancers.  Type unknown to the patient.   PLAN SUMMARY & DISPOSITION: IV Feraheme x2 Labs and RTC in 3 months  All questions were answered. The patient knows to call the clinic with any problems, questions or concerns.  Medical decision making: Moderate  Time spent on visit: I spent 15 minutes counseling the patient face to face. The total time spent in the appointment was 25 minutes and more than 50% was on counseling.   Harriett Rush, PA-C  03/28/2021 11:40 AM

## 2021-03-28 ENCOUNTER — Inpatient Hospital Stay (HOSPITAL_BASED_OUTPATIENT_CLINIC_OR_DEPARTMENT_OTHER): Payer: Medicare Other | Admitting: Physician Assistant

## 2021-03-28 ENCOUNTER — Other Ambulatory Visit: Payer: Self-pay

## 2021-03-28 VITALS — BP 136/63 | HR 50 | Temp 98.1°F | Resp 20 | Ht 64.0 in | Wt 274.2 lb

## 2021-03-28 DIAGNOSIS — R519 Headache, unspecified: Secondary | ICD-10-CM | POA: Diagnosis not present

## 2021-03-28 DIAGNOSIS — R5383 Other fatigue: Secondary | ICD-10-CM | POA: Diagnosis not present

## 2021-03-28 DIAGNOSIS — Z809 Family history of malignant neoplasm, unspecified: Secondary | ICD-10-CM | POA: Diagnosis not present

## 2021-03-28 DIAGNOSIS — I10 Essential (primary) hypertension: Secondary | ICD-10-CM | POA: Diagnosis not present

## 2021-03-28 DIAGNOSIS — Z808 Family history of malignant neoplasm of other organs or systems: Secondary | ICD-10-CM | POA: Diagnosis not present

## 2021-03-28 DIAGNOSIS — R059 Cough, unspecified: Secondary | ICD-10-CM | POA: Diagnosis not present

## 2021-03-28 DIAGNOSIS — M79605 Pain in left leg: Secondary | ICD-10-CM | POA: Diagnosis not present

## 2021-03-28 DIAGNOSIS — Z8249 Family history of ischemic heart disease and other diseases of the circulatory system: Secondary | ICD-10-CM | POA: Diagnosis not present

## 2021-03-28 DIAGNOSIS — D509 Iron deficiency anemia, unspecified: Secondary | ICD-10-CM | POA: Diagnosis not present

## 2021-03-28 DIAGNOSIS — F17210 Nicotine dependence, cigarettes, uncomplicated: Secondary | ICD-10-CM | POA: Diagnosis not present

## 2021-03-28 DIAGNOSIS — Z833 Family history of diabetes mellitus: Secondary | ICD-10-CM | POA: Diagnosis not present

## 2021-03-28 DIAGNOSIS — Z79899 Other long term (current) drug therapy: Secondary | ICD-10-CM | POA: Diagnosis not present

## 2021-03-28 NOTE — Patient Instructions (Signed)
[facility] at [facility] Discharge Instructions  You were seen today by Tarri Abernethy PA-C for your iron deficiency.  Your iron levels are still slightly low, which may be part of your ongoing fatigue.  We will schedule your for additional IV iron and see you back in 3 months for follow-up.     Thank you for choosing [place] at [facility] to provide your oncology and hematology care.  To afford each patient quality time with our provider, please arrive at least 15 minutes before your scheduled appointment time.   If you have a lab appointment with the [facility] please come in thru the Main Entrance and check in at the main information desk.  You need to re-schedule your appointment should you arrive 10 or more minutes late.  We strive to give you quality time with our providers, and arriving late affects you and other patients whose appointments are after yours.  Also, if you no show three or more times for appointments you may be dismissed from the clinic at the providers discretion.     Again, thank you for choosing [facility].  Our hope is that these requests will decrease the amount of time that you wait before being seen by our physicians.       _____________________________________________________________  Should you have questions after your visit to [facility], please contact our office at [phone] and follow the prompts.  Our office hours are 8:00 a.m. and 4:30 p.m. Monday - Friday.  Please note that voicemails left after 4:00 p.m. may not be returned until the following business day.  We are closed weekends and major holidays.  You do have access to a nurse 24-7, just call the main number to the clinic [phone] and do not press any options, hold on the line and a nurse will answer the phone.    For prescription refill requests, have your pharmacy contact our office and allow 72 hours.    Due  to Covid, you will need to wear a mask upon entering the hospital. If you do not have a mask, a mask will be given to you at the Main Entrance upon arrival. For doctor visits, patients may have 1 support person age 82 or older with them. For treatment visits, patients can not have anyone with them due to social distancing guidelines and our immunocompromised population.

## 2021-03-30 ENCOUNTER — Inpatient Hospital Stay (HOSPITAL_COMMUNITY): Payer: Medicare Other

## 2021-03-30 ENCOUNTER — Other Ambulatory Visit: Payer: Self-pay

## 2021-03-30 VITALS — BP 117/52 | HR 52 | Temp 98.4°F | Resp 20

## 2021-03-30 DIAGNOSIS — I10 Essential (primary) hypertension: Secondary | ICD-10-CM | POA: Diagnosis not present

## 2021-03-30 DIAGNOSIS — Z833 Family history of diabetes mellitus: Secondary | ICD-10-CM | POA: Diagnosis not present

## 2021-03-30 DIAGNOSIS — D509 Iron deficiency anemia, unspecified: Secondary | ICD-10-CM | POA: Diagnosis not present

## 2021-03-30 DIAGNOSIS — R718 Other abnormality of red blood cells: Secondary | ICD-10-CM

## 2021-03-30 DIAGNOSIS — F17210 Nicotine dependence, cigarettes, uncomplicated: Secondary | ICD-10-CM | POA: Diagnosis not present

## 2021-03-30 DIAGNOSIS — Z79899 Other long term (current) drug therapy: Secondary | ICD-10-CM | POA: Diagnosis not present

## 2021-03-30 DIAGNOSIS — Z8249 Family history of ischemic heart disease and other diseases of the circulatory system: Secondary | ICD-10-CM | POA: Diagnosis not present

## 2021-03-30 DIAGNOSIS — M79605 Pain in left leg: Secondary | ICD-10-CM | POA: Diagnosis not present

## 2021-03-30 DIAGNOSIS — R519 Headache, unspecified: Secondary | ICD-10-CM | POA: Diagnosis not present

## 2021-03-30 DIAGNOSIS — R5383 Other fatigue: Secondary | ICD-10-CM | POA: Diagnosis not present

## 2021-03-30 DIAGNOSIS — Z808 Family history of malignant neoplasm of other organs or systems: Secondary | ICD-10-CM | POA: Diagnosis not present

## 2021-03-30 DIAGNOSIS — Z809 Family history of malignant neoplasm, unspecified: Secondary | ICD-10-CM | POA: Diagnosis not present

## 2021-03-30 DIAGNOSIS — R059 Cough, unspecified: Secondary | ICD-10-CM | POA: Diagnosis not present

## 2021-03-30 MED ORDER — SODIUM CHLORIDE 0.9 % IV SOLN
510.0000 mg | Freq: Once | INTRAVENOUS | Status: AC
  Administered 2021-03-30: 510 mg via INTRAVENOUS
  Filled 2021-03-30: qty 510

## 2021-03-30 MED ORDER — LORATADINE 10 MG PO TABS
10.0000 mg | ORAL_TABLET | Freq: Once | ORAL | Status: AC
  Administered 2021-03-30: 10 mg via ORAL
  Filled 2021-03-30: qty 1

## 2021-03-30 MED ORDER — ACETAMINOPHEN 325 MG PO TABS
650.0000 mg | ORAL_TABLET | Freq: Once | ORAL | Status: AC
  Administered 2021-03-30: 650 mg via ORAL
  Filled 2021-03-30: qty 2

## 2021-03-30 MED ORDER — SODIUM CHLORIDE 0.9 % IV SOLN: Freq: Once | INTRAVENOUS | Status: AC

## 2021-03-30 NOTE — Progress Notes (Signed)
Pt presents today for Feraheme IV iron infusion per provider's order. Vital sings stable and pt voiced no new complaints at this time.  Peripheral IV started with good blood return pre and post infusion.  Feraheme given today per MD orders. Tolerated infusion without adverse affects. Vital signs stable. No complaints at this time. Discharged from clinic ambulatory in stable condition. Alert and oriented x 3. F/U with [facility] as scheduled.

## 2021-03-30 NOTE — Patient Instructions (Signed)
[facility]  Discharge Instructions: Thank you for choosing [facility] to provide your oncology and hematology care.  If you have a lab appointment with the Cancer Center, please come in thru the Main Entrance and check in at the main information desk.  Wear comfortable clothing and clothing appropriate for easy access to any Portacath or PICC line.   We strive to give you quality time with your provider. You may need to reschedule your appointment if you arrive late (15 or more minutes).  Arriving late affects you and other patients whose appointments are after yours.  Also, if you miss three or more appointments without notifying the office, you may be dismissed from the clinic at the provider's discretion.      For prescription refill requests, have your pharmacy contact our office and allow 72 hours for refills to be completed.    Today you received Feraheme IV iron infusion.     BELOW ARE SYMPTOMS THAT SHOULD BE REPORTED IMMEDIATELY: *FEVER GREATER THAN 100.4 F (38 C) OR HIGHER *CHILLS OR SWEATING *NAUSEA AND VOMITING THAT IS NOT CONTROLLED WITH YOUR NAUSEA MEDICATION *UNUSUAL SHORTNESS OF BREATH *UNUSUAL BRUISING OR BLEEDING *URINARY PROBLEMS (pain or burning when urinating, or frequent urination) *BOWEL PROBLEMS (unusual diarrhea, constipation, pain near the anus) TENDERNESS IN MOUTH AND THROAT WITH OR WITHOUT PRESENCE OF ULCERS (sore throat, sores in mouth, or a toothache) UNUSUAL RASH, SWELLING OR PAIN  UNUSUAL VAGINAL DISCHARGE OR ITCHING   Items with * indicate a potential emergency and should be followed up as soon as possible or go to the Emergency Department if any problems should occur.  Please show the CHEMOTHERAPY ALERT CARD or IMMUNOTHERAPY ALERT CARD at check-in to the Emergency Department and triage nurse.  Should you have questions after your visit or need to cancel or reschedule your appointment, please contact [facility]  [phone]  and follow the prompts.  Office hours are 8:00 a.m. to 4:30 p.m. Monday - Friday. Please note that voicemails left after 4:00 p.m. may not be returned until the following business day.  We are closed weekends and major holidays. You have access to a nurse at all times for urgent questions. Please call the main number to the clinic [phone] and follow the prompts.  For any non-urgent questions, you may also contact your provider using MyChart. We now offer e-Visits for anyone 18 and older to request care online for non-urgent symptoms. For details visit [email or web address].   Also download the MyChart app! Go to the app store, search "MyChart", open the app, select [facility], and log in with your MyChart username and password.  Due to Covid, a mask is required upon entering the hospital/clinic. If you do not have a mask, one will be given to you upon arrival. For doctor visits, patients may have 1 support person aged 18 or older with them. For treatment visits, patients cannot have anyone with them due to current Covid guidelines and our immunocompromised population.  

## 2021-04-09 ENCOUNTER — Encounter (HOSPITAL_COMMUNITY): Payer: Self-pay

## 2021-04-09 ENCOUNTER — Other Ambulatory Visit: Payer: Self-pay

## 2021-04-09 ENCOUNTER — Inpatient Hospital Stay (HOSPITAL_COMMUNITY): Payer: Medicare Other

## 2021-04-09 VITALS — BP 140/61 | HR 50 | Temp 97.5°F | Resp 18

## 2021-04-09 DIAGNOSIS — R059 Cough, unspecified: Secondary | ICD-10-CM | POA: Diagnosis not present

## 2021-04-09 DIAGNOSIS — Z79899 Other long term (current) drug therapy: Secondary | ICD-10-CM | POA: Diagnosis not present

## 2021-04-09 DIAGNOSIS — Z808 Family history of malignant neoplasm of other organs or systems: Secondary | ICD-10-CM | POA: Diagnosis not present

## 2021-04-09 DIAGNOSIS — D509 Iron deficiency anemia, unspecified: Secondary | ICD-10-CM

## 2021-04-09 DIAGNOSIS — Z833 Family history of diabetes mellitus: Secondary | ICD-10-CM | POA: Diagnosis not present

## 2021-04-09 DIAGNOSIS — R5383 Other fatigue: Secondary | ICD-10-CM | POA: Diagnosis not present

## 2021-04-09 DIAGNOSIS — Z809 Family history of malignant neoplasm, unspecified: Secondary | ICD-10-CM | POA: Diagnosis not present

## 2021-04-09 DIAGNOSIS — Z8249 Family history of ischemic heart disease and other diseases of the circulatory system: Secondary | ICD-10-CM | POA: Diagnosis not present

## 2021-04-09 DIAGNOSIS — R519 Headache, unspecified: Secondary | ICD-10-CM | POA: Diagnosis not present

## 2021-04-09 DIAGNOSIS — I10 Essential (primary) hypertension: Secondary | ICD-10-CM | POA: Diagnosis not present

## 2021-04-09 DIAGNOSIS — R718 Other abnormality of red blood cells: Secondary | ICD-10-CM

## 2021-04-09 DIAGNOSIS — F17210 Nicotine dependence, cigarettes, uncomplicated: Secondary | ICD-10-CM | POA: Diagnosis not present

## 2021-04-09 DIAGNOSIS — M79605 Pain in left leg: Secondary | ICD-10-CM | POA: Diagnosis not present

## 2021-04-09 MED ORDER — SODIUM CHLORIDE 0.9 % IV SOLN: Freq: Once | INTRAVENOUS | Status: AC

## 2021-04-09 MED ORDER — SODIUM CHLORIDE 0.9 % IV SOLN
510.0000 mg | Freq: Once | INTRAVENOUS | Status: AC
  Administered 2021-04-09: 510 mg via INTRAVENOUS
  Filled 2021-04-09: qty 510

## 2021-04-09 MED ORDER — ACETAMINOPHEN 325 MG PO TABS
650.0000 mg | ORAL_TABLET | Freq: Once | ORAL | Status: AC
  Administered 2021-04-09: 650 mg via ORAL
  Filled 2021-04-09: qty 2

## 2021-04-09 MED ORDER — LORATADINE 10 MG PO TABS
10.0000 mg | ORAL_TABLET | Freq: Once | ORAL | Status: AC
  Administered 2021-04-09: 10 mg via ORAL
  Filled 2021-04-09: qty 1

## 2021-04-09 NOTE — Progress Notes (Signed)
Patient tolerated iron infusion with no complaints voiced.  Peripheral IV site clean and dry with good blood return noted before and after infusion.  Band aid applied.  VSS with discharge and left in satisfactory condition with no s/s of distress noted.   

## 2021-04-09 NOTE — Patient Instructions (Signed)
[facility]  Discharge Instructions: ?Thank you for choosing [facility] to provide your oncology and hematology care.  ?If you have a lab appointment with the Cancer Center, please come in thru the Main Entrance and check in at the main information desk. ? ?Wear comfortable clothing and clothing appropriate for easy access to any Portacath or PICC line.  ? ?We strive to give you quality time with your provider. You may need to reschedule your appointment if you arrive late (15 or more minutes).  Arriving late affects you and other patients whose appointments are after yours.  Also, if you miss three or more appointments without notifying the office, you may be dismissed from the clinic at the provider?s discretion.    ?  ?For prescription refill requests, have your pharmacy contact our office and allow 72 hours for refills to be completed.   ? ?Today you received the following Feraheme, return as scheduled. ?  ?To help prevent nausea and vomiting after your treatment, we encourage you to take your nausea medication as directed. ? ?BELOW ARE SYMPTOMS THAT SHOULD BE REPORTED IMMEDIATELY: ?*FEVER GREATER THAN 100.4 F (38 ?C) OR HIGHER ?*CHILLS OR SWEATING ?*NAUSEA AND VOMITING THAT IS NOT CONTROLLED WITH YOUR NAUSEA MEDICATION ?*UNUSUAL SHORTNESS OF BREATH ?*UNUSUAL BRUISING OR BLEEDING ?*URINARY PROBLEMS (pain or burning when urinating, or frequent urination) ?*BOWEL PROBLEMS (unusual diarrhea, constipation, pain near the anus) ?TENDERNESS IN MOUTH AND THROAT WITH OR WITHOUT PRESENCE OF ULCERS (sore throat, sores in mouth, or a toothache) ?UNUSUAL RASH, SWELLING OR PAIN  ?UNUSUAL VAGINAL DISCHARGE OR ITCHING  ? ?Items with * indicate a potential emergency and should be followed up as soon as possible or go to the Emergency Department if any problems should occur. ? ?Please show the CHEMOTHERAPY ALERT CARD or IMMUNOTHERAPY ALERT CARD at check-in to the Emergency Department and triage  nurse. ? ?Should you have questions after your visit or need to cancel or reschedule your appointment, please contact [facility] [phone]  and follow the prompts.  Office hours are 8:00 a.m. to 4:30 p.m. Monday - Friday. Please note that voicemails left after 4:00 p.m. may not be returned until the following business day.  We are closed weekends and major holidays. You have access to a nurse at all times for urgent questions. Please call the main number to the clinic [phone] and follow the prompts. ? ?For any non-urgent questions, you may also contact your provider using MyChart. We now offer e-Visits for anyone 18 and older to request care online for non-urgent symptoms. For details visit [email or web address]. ?  ?Also download the MyChart app! Go to the app store, search "MyChart", open the app, select [place], and log in with your MyChart username and password. ? ?Due to Covid, a mask is required upon entering the hospital/clinic. If you do not have a mask, one will be given to you upon arrival. For doctor visits, patients may have 1 support person aged 18 or older with them. For treatment visits, patients cannot have anyone with them due to current Covid guidelines and our immunocompromised population.  ?

## 2021-04-20 ENCOUNTER — Ambulatory Visit (INDEPENDENT_AMBULATORY_CARE_PROVIDER_SITE_OTHER): Payer: Medicare Other | Admitting: Nurse Practitioner

## 2021-04-20 ENCOUNTER — Encounter (INDEPENDENT_AMBULATORY_CARE_PROVIDER_SITE_OTHER): Payer: Self-pay | Admitting: Nurse Practitioner

## 2021-04-20 ENCOUNTER — Ambulatory Visit (INDEPENDENT_AMBULATORY_CARE_PROVIDER_SITE_OTHER): Payer: Medicare Other

## 2021-04-20 ENCOUNTER — Other Ambulatory Visit (INDEPENDENT_AMBULATORY_CARE_PROVIDER_SITE_OTHER): Payer: Self-pay | Admitting: Vascular Surgery

## 2021-04-20 ENCOUNTER — Other Ambulatory Visit: Payer: Self-pay

## 2021-04-20 VITALS — BP 129/78 | HR 51 | Resp 12 | Ht 64.0 in | Wt 270.0 lb

## 2021-04-20 DIAGNOSIS — F17200 Nicotine dependence, unspecified, uncomplicated: Secondary | ICD-10-CM | POA: Diagnosis not present

## 2021-04-20 DIAGNOSIS — E119 Type 2 diabetes mellitus without complications: Secondary | ICD-10-CM | POA: Diagnosis not present

## 2021-04-20 DIAGNOSIS — Z9862 Peripheral vascular angioplasty status: Secondary | ICD-10-CM

## 2021-04-20 DIAGNOSIS — I739 Peripheral vascular disease, unspecified: Secondary | ICD-10-CM

## 2021-04-20 DIAGNOSIS — Z9889 Other specified postprocedural states: Secondary | ICD-10-CM

## 2021-04-20 DIAGNOSIS — I890 Lymphedema, not elsewhere classified: Secondary | ICD-10-CM | POA: Diagnosis not present

## 2021-04-29 ENCOUNTER — Encounter (INDEPENDENT_AMBULATORY_CARE_PROVIDER_SITE_OTHER): Payer: Self-pay | Admitting: Nurse Practitioner

## 2021-04-29 NOTE — Progress Notes (Signed)
Subjective:    Patient ID: Amy Tran, female    DOB: [birth year], 67 y.o.   MRN: [id number] Chief Complaint  Patient presents with   Follow-up    ultrasound    Amy Tran is a 66 year old female that returns to the office for followup and review of the noninvasive studies. There have been no interval changes in lower extremity symptoms. No interval shortening of the patient's claudication distance or development of rest pain symptoms. No new ulcers or wounds have occurred since the last visit.  There have been no significant changes to the patient's overall health care.  The patient denies amaurosis fugax or recent TIA symptoms. There are no recent neurological changes noted. The patient denies history of DVT, PE or superficial thrombophlebitis. The patient denies recent episodes of angina or shortness of breath.   ABI Rt=[name] and Lt=[name] right TBI 0.72 with a left TBI 0.9 (previous ABI's Rt=[place] and Lt=[place]) Duplex ultrasound of the right tibial arteries reveals biphasic waveforms at rest the left monophasic waveforms.  This may be slightly skewed due to the edema  Patient also continues to struggle with significant leg edema.  She has worn medical grade compression stockings however it has not been useful in controlling her swelling.  She also elevates her lower extremities and is active as possible.  None of these interventions have been truly successful in controlling her swelling.   Review of Systems  Cardiovascular:  Positive for leg swelling.      Objective:   Physical Exam Vitals reviewed.  HENT:     Head: Normocephalic.  Cardiovascular:     Rate and Rhythm: Normal rate.  Pulmonary:     Effort: Pulmonary effort is normal.  Musculoskeletal:     Right lower leg: 2+ Edema present.     Left lower leg: 2+ Edema present.  Skin:    Comments: Mild stasis dermatitis bilaterally  Neurological:     Mental Status: She is alert and oriented to person, place, and time.   Psychiatric:        Mood and Affect: Mood normal.        Behavior: Behavior normal.        Thought Content: Thought content normal.        Judgment: Judgment normal.    BP 129/78 (BP Location: Left Arm)   Pulse (!) 51   Resp 12   Ht 5\' 4"  (1.626 m)   Wt 270 lb (122.5 kg)   BMI 46.35 kg/m   Past Medical History:  Diagnosis Date   Arthritis    Asthma    Cancer (HCC)    multiple skin cancers   Diabetes mellitus without complication (HCC)    Diverticulosis    Fluid retention    GERD (gastroesophageal reflux disease)    High cholesterol    Hypertension    Incomplete rotator cuff tear    Myocardial infarction (HCC)    Obesity    Persistent atrial fibrillation (HCC)    PONV (postoperative nausea and vomiting)    Wears glasses    Wears partial dentures    bottom    Social History   Socioeconomic History   Marital status: Widowed    Spouse name: Not on file   Number of children: 2   Years of education: Not on file   Highest education level: Not on file  Occupational History   Occupation: works at [employer] store    Employer: [employer]   Tobacco Use  Smoking status: Every Day    Packs/day: 0.30    Years: 29.00    Pack years: 8.70    Types: Cigarettes    Start date: 04/09/1970   Smokeless tobacco: Never   Tobacco comments:    09/04/20 - states she is down to 9 ciggs / day   Vaping Use   Vaping Use: Never used  Substance and Sexual Activity   Alcohol use: Not Currently   Drug use: No   Sexual activity: Not Currently    Birth control/protection: Post-menopausal  Other Topics Concern   Not on file  Social History Narrative   Not on file   Social Determinants of Health   Financial Resource Strain: Not on file  Food Insecurity: Not on file  Transportation Needs: Not on file  Physical Activity: Not on file  Stress: Not on file  Social Connections: Not on file  Intimate Partner Violence: Not on file    Past Surgical History:  Procedure Laterality Date    APPENDECTOMY     CARDIOVERSION N/A 06/03/2019   Procedure: CARDIOVERSION;  Surgeon: Laqueta Linden, MD;  Location: [facility] ORS;  Service: Cardiovascular;  Laterality: N/A;   CARDIOVERSION N/A 07/13/2019   Procedure: CARDIOVERSION;  Surgeon: Sande Rives, MD;  Location: [facility] ENDOSCOPY;  Service: Cardiovascular;  Laterality: N/A;   CARPAL TUNNEL RELEASE Bilateral 2001   bilateral   CERVICAL POLYPECTOMY  04/08/2017   Procedure: POLYPECTOMY;  Surgeon: Tilda Burrow, MD;  Location: [facility] ORS;  Service: Gynecology;;  Endometrial   CHOLECYSTECTOMY     COLONOSCOPY  2007   MVH:QIONGEX internal hemorrhoids. Diminutive rectal polyp at 10 cm, cold biopsied/removed. The remainder of the rectal mucosa appeared normal Swallow left-sided diverticula. Diminutive polyp at the splenic flexure cold biopsied/removed (adenomatous)   COLONOSCOPY N/A 12/28/2013   Procedure: COLONOSCOPY;  Surgeon: Corbin Ade, MD;  Location: [facility] ENDO SUITE;  Service: Endoscopy;  Laterality: N/A;  730 - moved to 8:30 - Ginger notified pt   CORONARY STENT INTERVENTION N/A 04/27/2019   Procedure: CORONARY STENT INTERVENTION;  Surgeon: Lennette Bihari, MD;  Location: [facility] INVASIVE CV LAB;  Service: Cardiovascular;  Laterality: N/A;   DILATION AND CURETTAGE, DIAGNOSTIC / THERAPEUTIC  03/07/2017   ERCP  2002   GASTRIC BYPASS  2004   HYSTEROSCOPY WITH D & C N/A 04/08/2017   Procedure: DILATATION AND CURETTAGE /HYSTEROSCOPY;  Surgeon: Tilda Burrow, MD;  Location: [facility]S;  Service: Gynecology;  Laterality: N/A;   INTRAVASCULAR ULTRASOUND/IVUS N/A 09/12/2017   Procedure: INTRAVASCULAR ULTRASOUND/IVUS;  Surgeon: Sherren Kerns, MD;  Location: [facility] INVASIVE CV LAB;  Service: Cardiovascular;  Laterality: N/A;   LEFT HEART CATH AND CORONARY ANGIOGRAPHY N/A 04/27/2019   Procedure: LEFT HEART CATH AND CORONARY ANGIOGRAPHY;  Surgeon: Lennette Bihari, MD;  Location: [facility] INVASIVE CV LAB;  Service: Cardiovascular;  Laterality: N/A;   LOWER EXTREMITY  ANGIOGRAPHY Left 10/02/2020   Procedure: LOWER EXTREMITY ANGIOGRAPHY;  Surgeon: Annice Needy, MD;  Location: [facility] INVASIVE CV LAB;  Service: Cardiovascular;  Laterality: Left;   NECK SURGERY  1998   fusion   PERCUTANEOUS VENOUS THROMBECTOMY,LYSIS WITH INTRAVASCULAR ULTRASOUND (IVUS) Left 09/13/2017   Procedure: REMOVAL OF LYSIS CATHETER AND INTRAVASCULAR ULTRASOUND (IVUS) LEFT ILIAC VEIN;  Surgeon: Sherren Kerns, MD;  Location: [facility] OR;  Service: Vascular;  Laterality: Left;   PERIPHERAL VASCULAR INTERVENTION  09/15/2017   Procedure: PERIPHERAL VASCULAR INTERVENTION;  Surgeon: Maeola Harman, MD;  Location: [facility] INVASIVE CV LAB;  Service: Cardiovascular;;  VEINOUS/STENT   PERIPHERAL VASCULAR THROMBECTOMY Left 09/12/2017   Procedure: PERIPHERAL VASCULAR THROMBECTOMY;  Surgeon: Sherren Kerns, MD;  Location: [facility] INVASIVE CV LAB;  Service: Cardiovascular;  Laterality: Left;   PERIPHERAL VASCULAR THROMBECTOMY Left 09/15/2017   Procedure: PERIPHERAL VASCULAR THROMBECTOMY;  Surgeon: Maeola Harman, MD;  Location: [facility] INVASIVE CV LAB;  Service: Cardiovascular;  Laterality: Left;  IVC TO LT FEM/POP VEIN   SHOULDER ARTHROSCOPY WITH SUBACROMIAL DECOMPRESSION Left 06/23/2013   Procedure: LEFT SHOULDER ARTHROSCOPY WITH DEBRIDEMENT ROTATOR CUFF AND LABRUM;  Surgeon: Amy Simmer, MD;  Location: [facility];  Service: Orthopedics;  Laterality: Left;   SHOULDER SURGERY  1999   left    Family History  Problem Relation Age of Onset   Diabetes Father    Hypertension Father    Parkinson's disease Father    Cancer Father        not sure what kind   Hypertension Mother    Heart disease Mother    Drug abuse Sister    Mesothelioma Brother    Cancer Daughter        non-hodgkins lmphoma   Hypertension Brother    Diabetes Brother    Colon cancer Neg Hx    Gastric cancer Neg Hx    Esophageal cancer Neg Hx     Allergies  Allergen Reactions   Penicillins Hives and Rash     Has patient had a PCN reaction causing immediate rash, facial/tongue/throat swelling, SOB or lightheadedness with hypotension: Yes Has patient had a PCN reaction causing severe rash involving mucus membranes or skin necrosis: No Has patient had a PCN reaction that required hospitalization No Has patient had a PCN reaction occurring within the last 10 years: No If all of the above answers are "NO", then may proceed with Cephalosporin use.    Doxycycline Itching    CBC Latest Ref Rng & Units 03/21/2021 03/06/2021 06/02/2020  WBC 4.0 - 10.5 K/uL 8.9 7.5 8.2  Hemoglobin 12.0 - 15.0 g/dL 60.4 54.0 98.1  Hematocrit 36.0 - 46.0 % 44.0 42.5 38.0  Platelets 150 - 400 K/uL 278 251 329      CMP     Component Value Date/Time   NA 134 (L) 03/06/2021 1004   NA 137 11/03/2019 1123   K 3.7 03/06/2021 1004   CL 103 03/06/2021 1004   CO2 26 03/06/2021 1004   GLUCOSE 157 (H) 03/06/2021 1004   BUN 11 03/06/2021 1004   BUN 9 11/03/2019 1123   CREATININE 0.70 03/06/2021 1004   CALCIUM 9.1 03/06/2021 1004   PROT 7.6 03/06/2021 1004   ALBUMIN 4.1 03/06/2021 1004   AST 148 (H) 03/06/2021 1004   ALT 101 (H) 03/06/2021 1004   ALKPHOS 95 03/06/2021 1004   BILITOT 1.2 03/06/2021 1004   GFRNONAA >60 03/06/2021 1004   GFRAA 108 11/03/2019 1123     VAS [place] ABI WITH/WO TBI  Result Date: 04/23/2021  LOWER EXTREMITY DOPPLER STUDY Patient Name:  CARROLE MCMOORE  Date of Exam:   04/20/2021 Medical Rec #: [id number]          Accession #:    [id number] Date of Birth: 05/01/1955          Patient Gender: F Patient Age:   46 years Exam Location:  [facility] Procedure:      VAS [place] ABI WITH/WO TBI Referring Phys: Barbara Cower DEW --------------------------------------------------------------------------------  Indications: Peripheral artery disease, and severe edema.  Vascular Interventions: 10/02/2020 lt pop stent.  Performing Technologist: Salvadore Farber RVT  Examination Guidelines: A complete evaluation includes at  minimum, Doppler waveform signals and systolic blood pressure reading at the level of bilateral brachial, anterior tibial, and posterior tibial arteries, when vessel segments are accessible. Bilateral testing is considered an integral part of a complete examination. Photoelectric Plethysmograph (PPG) waveforms and toe systolic pressure readings are included as required and additional duplex testing as needed. Limited examinations for reoccurring indications may be performed as noted.  ABI Findings: +---------+-----------------+-----+--------+-----------------------------------+ Right    Rt Pressure      IndexWaveformComment                                      (mmHg)                                                            +---------+-----------------+-----+--------+-----------------------------------+ Brachial 140                                                               +---------+-----------------+-----+--------+-----------------------------------+ ATA                            biphasicNonComp pressures due to severe                                            edema.                              +---------+-----------------+-----+--------+-----------------------------------+ PTA                            biphasicNonComp pressures due to severe                                            edema.                              +---------+-----------------+-----+--------+-----------------------------------+ Great Toe101              0.72 Normal                                      +---------+-----------------+-----+--------+-----------------------------------+ +---------+-----------------+-----+--------+-----------------------------------+ Left     Lt Pressure      IndexWaveformComment                                      (mmHg)                                                             +---------+-----------------+-----+--------+-----------------------------------+  Brachial 136                                                               +---------+-----------------+-----+--------+-----------------------------------+ ATA                            biphasicNonComp pressures due to severe                                            edema.                              +---------+-----------------+-----+--------+-----------------------------------+ PTA                            biphasicNonComp pressures due to severe                                            edema.                              +---------+-----------------+-----+--------+-----------------------------------+ [place] Diones              0.89 Normal                                      +---------+-----------------+-----+--------+-----------------------------------+ +-------+-----------+-----------+------------+------------+ ABI/TBIToday's ABIToday's TBIPrevious ABIPrevious TBI +-------+-----------+-----------+------------+------------+ Right  [place]         .72                    .89          +-------+-----------+-----------+------------+------------+ Left   [place]         .89                    .62          +-------+-----------+-----------+------------+------------+  Left TBIs appear increased compared to prior study on 01/2021.  Summary: Right: Resting right ankle-brachial index indicates noncompressible right lower extremity arteries. The right toe-brachial index is normal. NonComp pressures due to severe edema. Left: Resting left ankle-brachial index indicates noncompressible left lower extremity arteries. The left toe-brachial index is normal. NonComp pressures due to severe edema.  *See table(s) above for measurements and observations.  Electronically signed by Festus Barren MD on 04/23/2021 at 1:36:00 PM.    Final        Assessment & Plan:   1. Peripheral arterial disease with history  of revascularization (HCC)  Recommend:  The patient has evidence of atherosclerosis of the lower extremities with no claudication.  The patient does not voice lifestyle limiting changes at this point in time.  Noninvasive studies do not suggest clinically significant change.  No invasive studies, angiography or surgery at this time The patient should continue walking and begin a more formal exercise program.  The patient should continue antiplatelet therapy and  aggressive treatment of the lipid abnormalities  No changes in the patient's medications at this time  The patient should continue wearing graduated compression socks 10-15 mmHg strength to control the mild edema.    2. Diabetes mellitus without complication (HCC) Continue hypoglycemic medications as already ordered, these medications have been reviewed and there are no changes at this time.  Hgb A1C to be monitored as already arranged by primary service   3. Tobacco dependence Smoking cessation was discussed, 3-10 minutes spent on this topic specifically    4. Lymphedema Recommend:  No surgery or intervention at this point in time.    I have reviewed my previous discussion with the patient regarding swelling and why it causes symptoms.  Patient will continue wearing graduated compression stockings class 1 (20-30 mmHg) on a daily basis. The patient will  beginning wearing the stockings first thing in the morning and removing them in the evening. The patient is instructed specifically not to sleep in the stockings.    In addition, behavioral modification including several periods of elevation of the lower extremities during the day will be continued.  This was reviewed with the patient during the initial visit.  The patient will also continue routine exercise, especially walking on a daily basis as was discussed during the initial visit.    Despite conservative treatments including graduated compression therapy class 1 and  behavioral modification including exercise and elevation the patient  has not obtained adequate control of the lymphedema.  The patient still has stage 3 lymphedema and therefore, I believe that a lymph pump should be added to improve the control of the patient's lymphedema.  Additionally, a lymph pump is warranted because it will reduce the risk of cellulitis and ulceration in the future.  Patient should follow-up in six months     Current Outpatient Medications on File Prior to Visit  Medication Sig Dispense Refill   acetaminophen (TYLENOL) 500 MG tablet Take 500 mg by mouth every 6 (six) hours as needed for mild pain or headache.     atorvastatin (LIPITOR) 80 MG tablet TAKE 1 TABLET BY MOUTH DAILY AT 6 PM. 90 tablet 2   BD PEN NEEDLE NANO U/F 32G X 4 MM MISC USE FOR VICTOZA INJECTIONS     clopidogrel (PLAVIX) 75 MG tablet TAKE 1 TABLET (75 MG TOTAL) BY MOUTH DAILY WITH BREAKFAST. 90 tablet 3   dofetilide (TIKOSYN) 250 MCG capsule TAKE 1 CAPSULE BY MOUTH 2 TIMES DAILY. 180 capsule 2   fluticasone (FLONASE) 50 MCG/ACT nasal spray Place 2 sprays into both nostrils daily as needed for rhinitis (congestion/ dryness).     hydrOXYzine (ATARAX) 25 MG tablet Take 25 mg by mouth every 6 (six) hours as needed.     JARDIANCE 10 MG TABS tablet Take 10 mg by mouth daily.     losartan (COZAAR) 100 MG tablet Take 100 mg by mouth daily with breakfast.      nitroGLYCERIN (NITROSTAT) 0.4 MG SL tablet Place 1 tablet (0.4 mg total) under the tongue every 5 (five) minutes as needed for chest pain. 30 tablet 0   potassium chloride SA (KLOR-CON) 20 MEQ tablet Take 1 tablet (20 mEq total) by mouth 2 (two) times daily.     spironolactone (ALDACTONE) 25 MG tablet Take 25 mg by mouth daily with breakfast.      torsemide (DEMADEX) 20 MG tablet Take 60 mg by mouth daily after breakfast.      VICTOZA 18 MG/3ML SOPN Inject 1.8 mg  into the skin at bedtime.      VITAMIN D PO Take 2 capsules by mouth daily.     XARELTO 20 MG  TABS tablet TAKE 1 TABLET BY MOUTH EVERYDAY AT BEDTIME 30 tablet 2   albuterol (VENTOLIN HFA) 108 (90 Base) MCG/ACT inhaler Inhale 2 puffs into the lungs every 6 (six) hours as needed for wheezing or shortness of breath. (Patient not taking: Reported on 03/30/2021)     cetirizine (ZYRTEC) 10 MG tablet Take 10 mg by mouth at bedtime. (Patient not taking: Reported on 04/20/2021)     ondansetron (ZOFRAN) 4 MG tablet Take 1 tablet (4 mg total) by mouth every 6 (six) hours. 12 tablet 0   No current facility-administered medications on file prior to visit.    There are no Patient Instructions on file for this visit. No follow-ups on file.   Georgiana Spinner, NP

## 2021-05-12 ENCOUNTER — Other Ambulatory Visit (HOSPITAL_COMMUNITY): Payer: Self-pay | Admitting: Internal Medicine

## 2021-05-19 ENCOUNTER — Encounter (HOSPITAL_COMMUNITY): Payer: Self-pay | Admitting: Emergency Medicine

## 2021-05-19 ENCOUNTER — Other Ambulatory Visit: Payer: Self-pay

## 2021-05-19 ENCOUNTER — Emergency Department (HOSPITAL_COMMUNITY)
Admission: EM | Admit: 2021-05-19 | Discharge: 2021-05-19 | Disposition: A | Discharge status: Home / Self Care | Payer: Medicare Other | Attending: Emergency Medicine | Admitting: Emergency Medicine

## 2021-05-19 DIAGNOSIS — D6832 Hemorrhagic disorder due to extrinsic circulating anticoagulants: Secondary | ICD-10-CM | POA: Diagnosis not present

## 2021-05-19 DIAGNOSIS — Z7984 Long term (current) use of oral hypoglycemic drugs: Secondary | ICD-10-CM | POA: Insufficient documentation

## 2021-05-19 DIAGNOSIS — I2510 Atherosclerotic heart disease of native coronary artery without angina pectoris: Secondary | ICD-10-CM | POA: Diagnosis not present

## 2021-05-19 DIAGNOSIS — S20212A Contusion of left front wall of thorax, initial encounter: Secondary | ICD-10-CM

## 2021-05-19 DIAGNOSIS — E119 Type 2 diabetes mellitus without complications: Secondary | ICD-10-CM | POA: Diagnosis not present

## 2021-05-19 DIAGNOSIS — R222 Localized swelling, mass and lump, trunk: Secondary | ICD-10-CM | POA: Diagnosis present

## 2021-05-19 DIAGNOSIS — Z7902 Long term (current) use of antithrombotics/antiplatelets: Secondary | ICD-10-CM | POA: Diagnosis not present

## 2021-05-19 DIAGNOSIS — M7981 Nontraumatic hematoma of soft tissue: Secondary | ICD-10-CM | POA: Diagnosis not present

## 2021-05-19 DIAGNOSIS — Z7901 Long term (current) use of anticoagulants: Secondary | ICD-10-CM | POA: Diagnosis not present

## 2021-05-19 DIAGNOSIS — I10 Essential (primary) hypertension: Secondary | ICD-10-CM | POA: Insufficient documentation

## 2021-05-19 DIAGNOSIS — M5431 Sciatica, right side: Secondary | ICD-10-CM | POA: Insufficient documentation

## 2021-05-19 DIAGNOSIS — S20211A Contusion of right front wall of thorax, initial encounter: Secondary | ICD-10-CM | POA: Diagnosis not present

## 2021-05-19 MED ORDER — LIDOCAINE-EPINEPHRINE (PF) 1 %-1:200000 IJ SOLN
20.0000 mL | Freq: Once | INTRAMUSCULAR | Status: AC
  Administered 2021-05-19: 20 mL
  Filled 2021-05-19: qty 30

## 2021-05-19 MED ORDER — OXYCODONE HCL 5 MG PO TABS: 5.0000 mg | ORAL_TABLET | ORAL | 0 refills | Status: DC | PRN

## 2021-05-19 MED ORDER — OXYCODONE-ACETAMINOPHEN 5-325 MG PO TABS: 1.0000 | ORAL_TABLET | ORAL | 0 refills | Status: DC | PRN

## 2021-05-19 MED ORDER — TIZANIDINE HCL 4 MG PO TABS: 4.0000 mg | ORAL_TABLET | Freq: Four times a day (QID) | ORAL | 0 refills | Status: DC | PRN

## 2021-05-19 NOTE — ED Provider Notes (Signed)
?[facility] ?Provider Note ? ? ?CSN: [id number] ?Arrival date & time: 05/19/21  0505 ? ?  ? ?History ? ?Chief Complaint  ?Patient presents with  ? Leg Pain  ? Abscess  ? ? ?Amy Tran is a 66 y.o. female. ? ?The history is provided by the patient.  ?Leg Pain ?Abscess ?She has history of hypertension, diabetes, hyperlipidemia, coronary artery disease, atrial fibrillation anticoagulated on rivaroxaban and complains of pain in her right hip radiating down the thigh to just distal to the right knee.  This started yesterday.  It is worse when walking.  She denies any weakness or numbness or tingling.  She denies any back pain.  She denies bowel or bladder dysfunction.  She has taken acetaminophen without relief.  She cannot take NSAIDs because of anticoagulation.  She denies any trauma or unusual activity.  She also complains of a boil on the left lateral rib cage.  It has been present for 1-2 weeks and is very uncomfortable because her bra strap rubs up against it.  There has been no drainage.  She denies fever or chills. ?  ?Home Medications ?Prior to Admission medications   ?Medication Sig Start Date End Date Taking? Authorizing Provider  ?acetaminophen (TYLENOL) 500 MG tablet Take 500 mg by mouth every 6 (six) hours as needed for mild pain or headache.    [provider]  ?albuterol (VENTOLIN HFA) 108 (90 Base) MCG/ACT inhaler Inhale 2 puffs into the lungs every 6 (six) hours as needed for wheezing or shortness of breath. ?Patient not taking: Reported on 03/30/2021    [provider]  ?atorvastatin (LIPITOR) 80 MG tablet TAKE 1 TABLET BY MOUTH DAILY AT 6 PM. 02/13/21   Branch, Alphonse Guild, MD  ?BD PEN NEEDLE NANO U/F 32G X 4 MM MISC USE FOR [place] INJECTIONS 08/03/18   [provider]  ?cetirizine (ZYRTEC) 10 MG tablet Take 10 mg by mouth at bedtime. ?Patient not taking: Reported on 04/20/2021    [provider]  ?clopidogrel (PLAVIX) 75 MG tablet TAKE 1 TABLET  (75 MG TOTAL) BY MOUTH DAILY WITH BREAKFAST. 06/12/20   Imogene Burn, PA-C  ?dofetilide (TIKOSYN) 250 MCG capsule TAKE 1 CAPSULE BY MOUTH TWICE A DAY 05/14/21   Allred, Jeneen Rinks, MD  ?fluticasone (FLONASE) 50 MCG/ACT nasal spray Place 2 sprays into both nostrils daily as needed for rhinitis (congestion/ dryness). 06/29/18   [provider]  ?hydrOXYzine (ATARAX) 25 MG tablet Take 25 mg by mouth every 6 (six) hours as needed. 12/04/20   [provider]  ?JARDIANCE 10 MG TABS tablet Take 10 mg by mouth daily. 02/11/21   [provider]  ?losartan (COZAAR) 100 MG tablet Take 100 mg by mouth daily with breakfast.     [provider]  ?nitroGLYCERIN (NITROSTAT) 0.4 MG SL tablet Place 1 tablet (0.4 mg total) under the tongue every 5 (five) minutes as needed for chest pain. 04/28/19   Florencia Reasons, MD  ?ondansetron (ZOFRAN) 4 MG tablet Take 1 tablet (4 mg total) by mouth every 6 (six) hours. 03/06/21   Evalee Jefferson, PA-C  ?potassium chloride SA (KLOR-CON) 20 MEQ tablet Take 1 tablet (20 mEq total) by mouth 2 (two) times daily. 09/23/19   Shirley Friar, PA-C  ?spironolactone (ALDACTONE) 25 MG tablet Take 25 mg by mouth daily with breakfast.     [provider]  ?torsemide (DEMADEX) 20 MG tablet Take 60 mg by mouth daily after breakfast.  [provider]  ?VICTOZA 18 MG/3ML SOPN Inject 1.8 mg into the skin at bedtime.  08/04/18   [provider]  ?VITAMIN D PO Take 2 capsules by mouth daily.    [provider]  ?XARELTO 20 MG TABS tablet TAKE 1 TABLET BY MOUTH EVERYDAY AT BEDTIME 02/26/21   Waynetta Sandy, MD  ?   ? ?Allergies    ?Penicillins and Doxycycline   ? ?Review of Systems   ?Review of Systems  ?All other systems reviewed and are negative. ? ?Physical Exam ?Updated Vital Signs ?BP (!) 135/59   Pulse (!) 55   Temp 98.2 ?F (36.8 ?C) (Oral)   Resp 16   Ht '5\' 4"'$  (1.626 m)   Wt 124.7 kg   SpO2 94%   BMI 47.20 kg/m?  ?Physical  Exam ?Vitals and nursing note reviewed.  ?66 year old female, resting comfortably and in no acute distress. Vital signs are normal. Oxygen saturation is 94%, which is normal. ?Head is normocephalic and atraumatic. PERRLA, EOMI. Oropharynx is clear. ?Neck is nontender and supple without adenopathy or JVD. ?Back is nontender and there is no CVA tenderness.  Straight leg raise is positive on the right at 45 degrees. ?Lungs are clear without rales, wheezes, or rhonchi. ?Chest has an area of induration in the left posterior lateral rib cage.  It measures 10 cm x 5 cm with 1 small area with fluctuance.  There is no erythema or warmth. ?Heart has regular rate and rhythm without murmur. ?Abdomen is soft, flat, nontender without masses or hepatosplenomegaly and peristalsis is normoactive. ?Extremities have moderately severe venous stasis changes bilaterally, full range of motion is present. ?Skin is warm and dry without rash. ?Neurologic: Mental status is normal, cranial nerves are intact, there are no motor or sensory deficits. ? ?ED Results / Procedures / Treatments   ? ?Procedures ?Marland Kitchen.Incision and Drainage ? ?Date/Time: 05/19/2021 7:01 AM ?Performed by: Delora Fuel, MD ?Authorized by: Delora Fuel, MD  ? ?Consent:  ?  Consent obtained:  Verbal ?  Consent given by:  Patient ?  Risks, benefits, and alternatives were discussed: yes   ?  Risks discussed:  Bleeding, incomplete drainage and pain ?  Alternatives discussed:  No treatment ?Universal protocol:  ?  Procedure explained and questions answered to patient or proxy's satisfaction: yes   ?  Relevant documents present and verified: yes   ?  Required blood products, implants, devices, and special equipment available: yes   ?  Site/side marked: yes   ?  Immediately prior to procedure, a time out was called: yes   ?  Patient identity confirmed:  Verbally with patient and arm band ?Location:  ?  Type:  Hematoma ?  Size:  10cm x 5 cm ?  Location:  Trunk ?  Trunk location:   Chest ?Pre-procedure details:  ?  Skin preparation:  Antiseptic wash ?Sedation:  ?  Sedation type:  None ?Anesthesia:  ?  Anesthesia method:  Local infiltration ?  Local anesthetic:  Lidocaine 1% WITH epi ?Procedure type:  ?  Complexity:  Simple ?Procedure details:  ?  Ultrasound guidance: no   ?  Needle aspiration: yes   ?  Drainage:  Bloody ?Post-procedure details:  ?  Procedure completion:  Tolerated well, no immediate complications  ? ? ?Medications Ordered in ED ?Medications  ?lidocaine-EPINEPHrine (PF) (XYLOCAINE-EPINEPHrine) 1 %-1:200000 (PF) injection 20 mL (20 mLs Infiltration Given 05/19/21 0650)  ? ? ?ED Course/ Medical Decision Making/ A&P ?  ?                        ?  Medical Decision Making ?Risk ?Prescription drug management. ? ? ?Right leg pain and pattern most suggestive of sciatica.  No red flags to suggest more serious pathology such as epidural abscess.  Indurated area in the chest wall does not have the typical appearance of an abscess and I wonder if this actually may be a hematoma secondary to her anticoagulated state.  I am reluctant to do incision and drainage in the patient who is anticoagulated, will try needle aspiration.  Old records reviewed, and she has had MRIs of lumbar spine showing herniated disks with nerve root compression. ? ?The indurated area was anesthetized with lidocaine and aspirated and a small amount of dark red blood was obtained (4 ml).  However, most of it did not have any blood that could be aspirated.  Decision is made not to proceed with more extensive treatment, and she is referred to general surgery for outpatient drainage if that is what patient wants to proceed with.  She is discharged with prescriptions for oxycodone and tizanidine. ? ?Final Clinical Impression(s) / ED Diagnoses ?Final diagnoses:  ?Sciatica, right side  ?Hematoma, chest wall, left, initial encounter  ?Chronic anticoagulation  ? ? ?Rx / DC Orders ?ED Discharge Orders   ? ?      Ordered  ?   oxyCODONE-acetaminophen (PERCOCET) 5-325 MG tablet  Every 4 hours PRN       ? 05/19/21 0657  ?  oxyCODONE (ROXICODONE) 5 MG immediate release tablet  Every 4 hours PRN       ? 05/19/21 0657  ?  tiZANidine (ZANAFLEX) 4 MG tablet

## 2021-05-19 NOTE — Discharge Instructions (Signed)
Take acetaminophen as needed.  You may combine it with oxycodone to get additional pain relief.  Please note that your take home pack combines oxycodone and acetaminophen. ? ?Return if you develop any weakness in your legs or inability to urinate or move your bowels. ? ?The knot on your side is a hematoma, and collection of blood under the skin.  It will gradually dissolve, but that may take several months.  If you want to have it taken care of more quickly, please follow-up with the general surgeon.  However, if you do something to evacuate the blood, it may just reaccumulate. ?

## 2021-05-19 NOTE — ED Triage Notes (Signed)
Pt here for c/o R leg pain that starts at her hip and radiates down entire leg. States worse with sitting. Pt also states she has a boil on her back that she would like looked at as well. ?

## 2021-05-21 ENCOUNTER — Other Ambulatory Visit: Payer: Self-pay | Admitting: Orthopedic Surgery

## 2021-05-21 DIAGNOSIS — M5441 Lumbago with sciatica, right side: Secondary | ICD-10-CM | POA: Diagnosis not present

## 2021-05-21 DIAGNOSIS — M5410 Radiculopathy, site unspecified: Secondary | ICD-10-CM

## 2021-05-24 ENCOUNTER — Other Ambulatory Visit: Payer: Self-pay

## 2021-05-24 ENCOUNTER — Ambulatory Visit (INDEPENDENT_AMBULATORY_CARE_PROVIDER_SITE_OTHER): Payer: Medicare Other | Admitting: Surgery

## 2021-05-24 ENCOUNTER — Encounter: Payer: Self-pay | Admitting: Surgery

## 2021-05-24 VITALS — BP 163/72 | HR 55 | Temp 98.6°F | Resp 18 | Ht 64.0 in | Wt 275.0 lb

## 2021-05-24 DIAGNOSIS — L02213 Cutaneous abscess of chest wall: Secondary | ICD-10-CM | POA: Diagnosis not present

## 2021-05-24 MED ORDER — CLINDAMYCIN HCL 300 MG PO CAPS: 300.0000 mg | ORAL_CAPSULE | Freq: Three times a day (TID) | ORAL | 0 refills | Status: AC

## 2021-05-24 MED ORDER — OXYCODONE HCL 5 MG PO TABS: 5.0000 mg | ORAL_TABLET | Freq: Four times a day (QID) | ORAL | 0 refills | Status: AC | PRN

## 2021-05-24 NOTE — Progress Notes (Signed)
[facility] History and Physical ? ?Reason for Referral: Chest wall abscess ?Referring Physician: Dr. Roxanne Mins ? ?Chief Complaint   ?Hospitalization Follow-up ?  ? ? ?Amy Tran is a 66 y.o. female.  ?HPI: Patient presents for evaluation of left posterior chest abscess.  She is not sure how long the abscess has been present, but noted that she had a small cyst area about 3 to 4 months ago, at which time Dr. Willey Blade said for her to monitor it.  She presented to the ED on 4/8 for leg pain, at which time the ED physician performed an aspiration of this left chest wall abscess.  They obtained bloody output from the area.  The patient's granddaughter states that she popped the cyst prior to her presenting to the ED, with a small amount of white drainage.  The patient denies any fevers or chills.  She has a history of multiple cysts on her groins and buttock that have required incision and drainage.  She denies ever having the cyst excised.  She takes Xarelto and Plavix.  She has not taken the Plavix in 6 days, as she was concerned that she was going to bleed from the ED aspiration site.  She last took her Xarelto yesterday.  She smokes 1 pack/day, and is trying to quit.  She occasionally will drink alcohol, and denies use of illicit drugs. ? ?Past Medical History:  ?Diagnosis Date  ? Arthritis   ? Asthma   ? Cancer [facility])   ? multiple skin cancers  ? Diabetes mellitus without complication ([place])   ? Diverticulosis   ? Fluid retention   ? GERD (gastroesophageal reflux disease)   ? High cholesterol   ? Hypertension   ? Incomplete rotator cuff tear   ? Myocardial infarction [facility])   ? Obesity   ? Persistent atrial fibrillation ([place])   ? PONV (postoperative nausea and vomiting)   ? Wears glasses   ? Wears partial dentures   ? bottom  ? ? ?Past Surgical History:  ?Procedure Laterality Date  ? APPENDECTOMY    ? CARDIOVERSION N/A 06/03/2019  ? Procedure: CARDIOVERSION;  Surgeon: Herminio Commons, MD;   Location: [facility];  Service: Cardiovascular;  Laterality: N/A;  ? CARDIOVERSION N/A 07/13/2019  ? Procedure: CARDIOVERSION;  Surgeon: Geralynn Rile, MD;  Location: [place];  Service: Cardiovascular;  Laterality: N/A;  ? [place] Bilateral 2001  ? bilateral  ? CERVICAL POLYPECTOMY  04/08/2017  ? Procedure: POLYPECTOMY;  Surgeon: [name], MD;  Location: [facility];  Service: Gynecology;;  Endometrial  ? CHOLECYSTECTOMY    ? COLONOSCOPY  2007  ? [id number] internal hemorrhoids. Diminutive rectal polyp at 10 cm, cold biopsied/removed. The remainder of the rectal mucosa appeared normal Swallow left-sided diverticula. Diminutive polyp at the splenic flexure cold biopsied/removed (adenomatous)  ? COLONOSCOPY N/A 12/28/2013  ? Procedure: COLONOSCOPY;  Surgeon: Daneil Dolin, MD;  [facility];  Service: Endoscopy;  Laterality: N/A;  730 - moved to 8:30 - Ginger notified pt  ? CORONARY STENT INTERVENTION N/A 04/27/2019  ? Procedure: CORONARY STENT INTERVENTION;  Surgeon: Troy Sine, MD;  Location: [place] [facility];  Service: Cardiovascular;  Laterality: N/A;  ? DILATION AND CURETTAGE, DIAGNOSTIC / THERAPEUTIC  03/07/2017  ? ERCP  2002  ? GASTRIC BYPASS  2004  ? HYSTEROSCOPY WITH D & C N/A 04/08/2017  ? Procedure: DILATATION AND CURETTAGE /HYSTEROSCOPY;  Surgeon: [name], MD;  Location: [facility];  Service: Gynecology;  Laterality: N/A;  ? INTRAVASCULAR ULTRASOUND/IVUS N/A 09/12/2017  ? Procedure: INTRAVASCULAR ULTRASOUND/IVUS;  Surgeon: Elam Dutch, MD;  Location: [place] [facility];  Service: Cardiovascular;  Laterality: N/A;  ? LEFT HEART CATH AND CORONARY ANGIOGRAPHY N/A 04/27/2019  ? Procedure: LEFT HEART CATH AND CORONARY ANGIOGRAPHY;  Surgeon: Troy Sine, MD;  Location: [place] [facility];  Service: Cardiovascular;  Laterality: N/A;  ? LOWER EXTREMITY ANGIOGRAPHY Left 10/02/2020  ? Procedure: LOWER EXTREMITY ANGIOGRAPHY;  Surgeon: Algernon Huxley, MD;  Location:  [place] [facility];  Service: Cardiovascular;  Laterality: Left;  ? NECK SURGERY  1998  ? fusion  ? PERCUTANEOUS VENOUS THROMBECTOMY,LYSIS WITH INTRAVASCULAR ULTRASOUND (IVUS) Left 09/13/2017  ? Procedure: REMOVAL OF LYSIS CATHETER AND INTRAVASCULAR ULTRASOUND (IVUS) LEFT ILIAC VEIN;  Surgeon: Elam Dutch, MD;  Location: [place];  Service: Vascular;  Laterality: Left;  ? PERIPHERAL VASCULAR INTERVENTION  09/15/2017  ? Procedure: PERIPHERAL VASCULAR INTERVENTION;  Surgeon: Waynetta Sandy, MD;  Location: [place] [facility];  Service: Cardiovascular;;  VEINOUS/STENT  ? PERIPHERAL VASCULAR THROMBECTOMY Left 09/12/2017  ? Procedure: PERIPHERAL VASCULAR THROMBECTOMY;  Surgeon: Elam Dutch, MD;  Location: [place] [facility];  Service: Cardiovascular;  Laterality: Left;  ? PERIPHERAL VASCULAR THROMBECTOMY Left 09/15/2017  ? Procedure: PERIPHERAL VASCULAR THROMBECTOMY;  Surgeon: Waynetta Sandy, MD;  Location: [place] [facility];  Service: Cardiovascular;  Laterality: Left;  IVC TO LT FEM/POP VEIN  ? SHOULDER ARTHROSCOPY WITH SUBACROMIAL DECOMPRESSION Left 06/23/2013  ? Procedure: LEFT SHOULDER ARTHROSCOPY WITH DEBRIDEMENT ROTATOR CUFF AND LABRUM;  Surgeon: Lorn Junes, MD;  Location: [place];  Service: Orthopedics;  Laterality: Left;  ? SHOULDER SURGERY  1999  ? left  ? ? ?Family History  ?Problem Relation Age of Onset  ? Diabetes Father   ? Hypertension Father   ? Parkinson's disease Father   ? Cancer Father   ?     not sure what kind  ? Hypertension Mother   ? Heart disease Mother   ? Drug abuse Sister   ? Mesothelioma Brother   ? Cancer Daughter   ?     non-hodgkins lmphoma  ? Hypertension Brother   ? Diabetes Brother   ? Colon cancer Neg Hx   ? Gastric cancer Neg Hx   ? Esophageal cancer Neg Hx   ? ? ?Social History  ? ?Tobacco Use  ? Smoking status: Every Day  ?  Packs/day: 0.30  ?  Years: 29.00  ?  Pack years: 8.70  ?  Types: Cigarettes  ?  Start date: 04/09/1970  ?  Smokeless tobacco: Never  ? Tobacco comments:  ?  09/04/20 - states she is down to 9 ciggs / day   ?Vaping Use  ? Vaping Use: Never used  ?Substance Use Topics  ? Alcohol use: Not Currently  ? Drug use: No  ? ? ?Medications: I have reviewed the patient's current medications. ?Allergies as of 05/24/2021   ? ?   Reactions  ? Penicillins Hives, Rash  ? Has patient had a PCN reaction causing immediate rash, facial/tongue/throat swelling, SOB or lightheadedness with hypotension: Yes ?Has patient had a PCN reaction causing severe rash involving mucus membranes or skin necrosis: No ?Has patient had a PCN reaction that required hospitalization No ?Has patient had a PCN reaction occurring within the last 10 years: No ?If all of the above answers are "NO", then may proceed with Cephalosporin use.  ? Doxycycline  Itching  ? ?  ? ?  ?Medication List  ?  ? ?  ? Accurate as of May 24, 2021 10:43 AM. If you have any questions, ask your nurse or doctor.  ?  ?  ? ?  ? ?STOP taking these medications   ? ?ondansetron 4 MG tablet ?Commonly known as: ZOFRAN ?Stopped by: Rusty Aus, DO ?  ? ?  ? ?TAKE these medications   ? ?acetaminophen 500 MG tablet ?Commonly known as: TYLENOL ?Take 500 mg by mouth every 6 (six) hours as needed for mild pain or headache. ?  ?albuterol 108 (90 Base) MCG/ACT inhaler ?Commonly known as: VENTOLIN HFA ?Inhale 2 puffs into the lungs every 6 (six) hours as needed for wheezing or shortness of breath. ?  ?atorvastatin 80 MG tablet ?Commonly known as: LIPITOR ?TAKE 1 TABLET BY MOUTH DAILY AT 6 PM. ?  ?BD Pen Needle Nano U/F 32G X 4 MM Misc ?Generic drug: Insulin Pen Needle ?USE FOR [place] INJECTIONS ?  ?cetirizine 10 MG tablet ?Commonly known as: ZYRTEC ?Take 10 mg by mouth at bedtime. ?  ?clopidogrel 75 MG tablet ?Commonly known as: PLAVIX ?TAKE 1 TABLET (75 MG TOTAL) BY MOUTH DAILY WITH BREAKFAST. ?  ?dofetilide 250 MCG capsule ?Commonly known as: TIKOSYN ?TAKE 1 CAPSULE BY MOUTH TWICE A DAY ?   ?fluticasone 50 MCG/ACT nasal spray ?Commonly known as: FLONASE ?Place 2 sprays into both nostrils daily as needed for rhinitis (congestion/ dryness). ?  ?hydrOXYzine 25 MG tablet ?Commonly known as: ATARAX ?Take 25 m

## 2021-05-24 NOTE — Patient Instructions (Signed)
Instructions: ? ?-May remove packing tomorrow ?-Let soapy water run over the incision ?-Keep the area covered and dry ?-If the incisions bleed, hold direct pressure ?-Hold Xarelto for 2 days ?-Hold Plavix for another 2 days ?-If the area starts uncontrollably bleeding, go to the ED ?

## 2021-06-02 IMAGING — DX DG CHEST 1V PORT
1 series · 1 of 1 positions shown · non-contrast
Comparison: 06/02/2017

CLINICAL DATA: Cough and shortness of breath for 1 week

EXAM:
PORTABLE CHEST 1 VIEW

[chest ap grid]
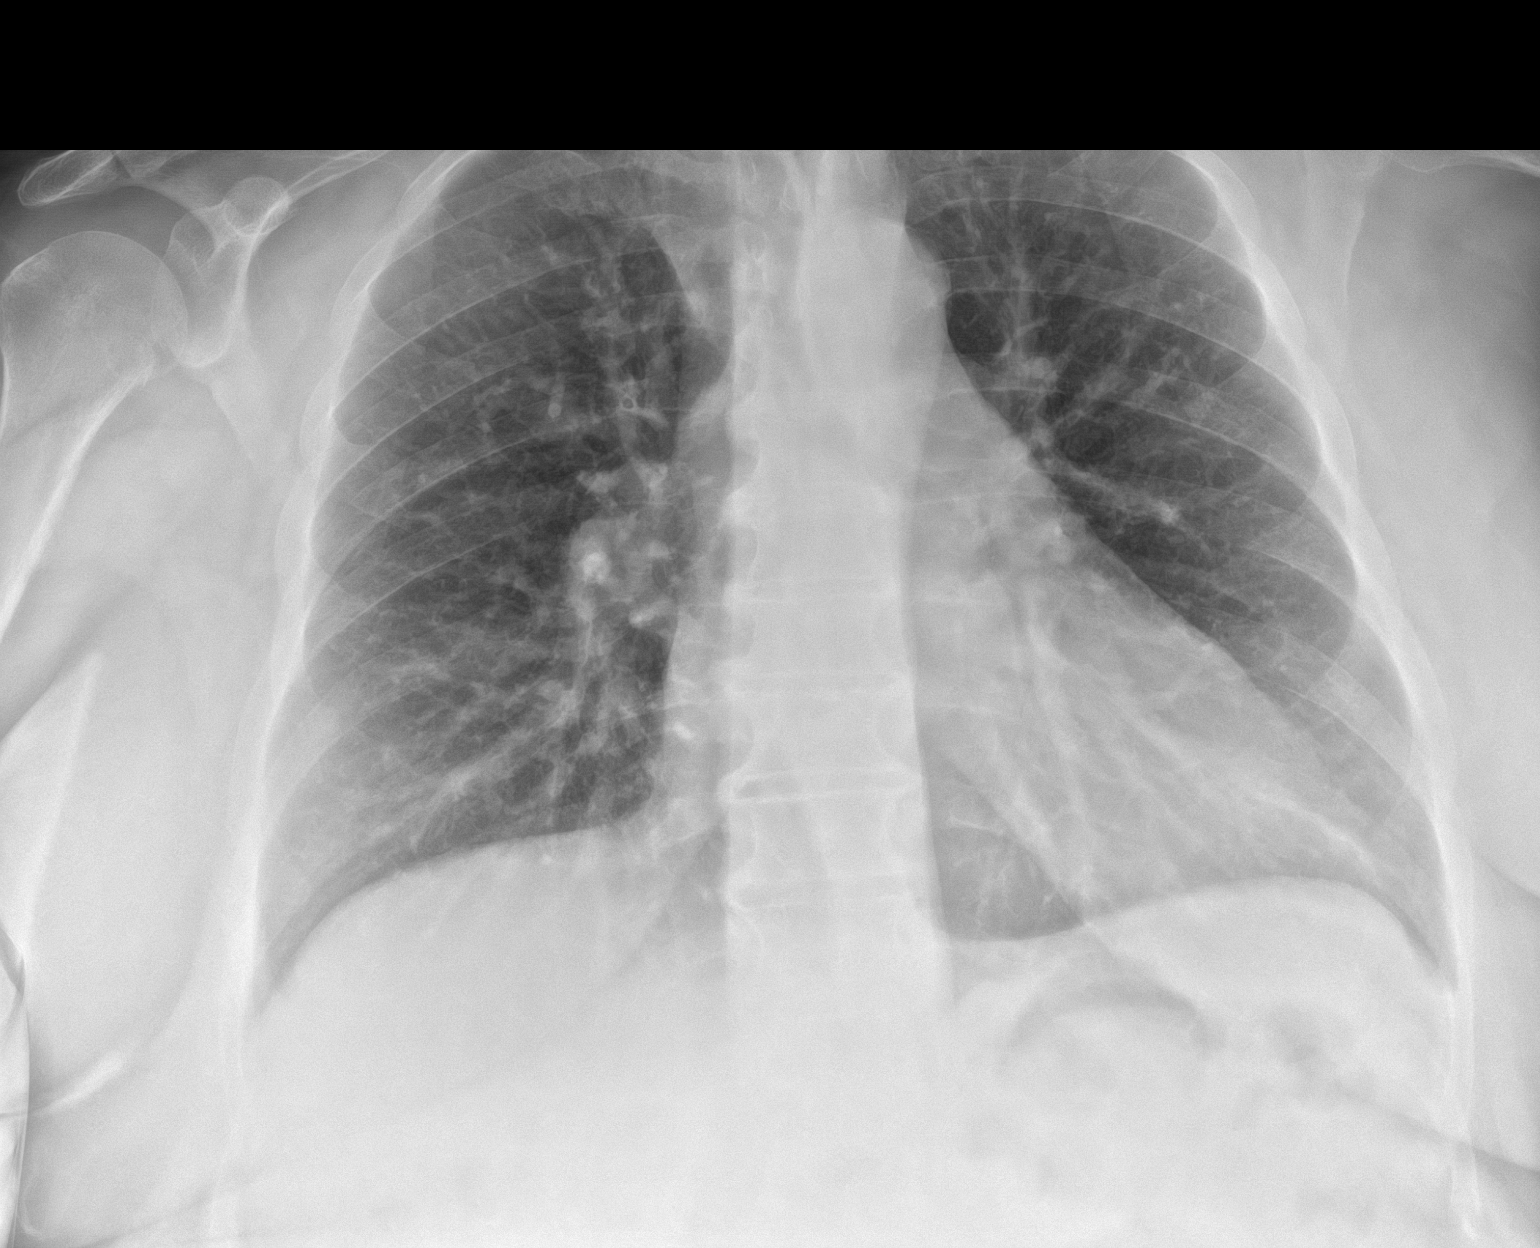

[1 of 1 positions shown; findings below may reference images not displayed]

FINDINGS: Cardiac shadow is within normal limits. The lungs are well aerated
bilaterally. No focal infiltrate or sizable effusion is seen. No
bony abnormality is noted. Degenerative changes of the thoracic
spine are seen.
IMPRESSION: No acute abnormality noted.

## 2021-06-05 ENCOUNTER — Other Ambulatory Visit: Payer: Self-pay

## 2021-06-05 ENCOUNTER — Encounter: Payer: Self-pay | Admitting: Surgery

## 2021-06-05 ENCOUNTER — Ambulatory Visit (INDEPENDENT_AMBULATORY_CARE_PROVIDER_SITE_OTHER): Payer: Medicare Other | Admitting: Surgery

## 2021-06-05 VITALS — BP 128/82 | HR 59 | Temp 98.3°F | Resp 16 | Ht 64.0 in | Wt 264.0 lb

## 2021-06-05 DIAGNOSIS — L02213 Cutaneous abscess of chest wall: Secondary | ICD-10-CM

## 2021-06-05 NOTE — Progress Notes (Signed)
[facility] Surgical Clinic Note  ? ?HPI:  ?66 y.o. Female presents to clinic for follow-up status post incision and drainage of left lateral chest wall abscess on 4/13.  Patient reports that she initially had purulent drainage from the incision site, but over the last week, she has only had serosanguineous drainage.  She stopped having drainage over the last 2 days.  She was able to take her clindamycin for 7 days, but stopped this medication 3 days early secondary to nausea and vomiting.  The patient denies any significant pain in her left chest site.  She denies fevers and chills.  She has not restarted her blood thinning medications. ? ?Review of Systems:  ?All other review of systems: otherwise negative  ? ?Vital Signs:  ?BP 128/82   Pulse (!) 59   Temp 98.3 ?F (36.8 ?C) (Oral)   Resp 16   Ht '5\' 4"'$  (1.626 m)   Wt 264 lb (119.7 kg)   SpO2 97%   BMI 45.32 kg/m?   ? ?Physical Exam:  ?Physical Exam ?Vitals reviewed.  ?Constitutional:   ?   Appearance: Normal appearance.  ?Skin: ?   Comments: Left lateral chest wall I&D sites have healed, area is nontender, no erythema, no induration, no fluctuance.  There is no palpable underlying cyst at this time  ?Neurological:  ?   Mental Status: She is alert.  ? ?Laboratory studies: None ? ?Imaging:  ?None ? ?Assessment:  ?66 y.o. yo Female who presents for follow-up status post left chest incision and drainage on 4/13 ? ?Plan:  ?-Patient has been doing well since her I&D ?-Advised her to restart her blood thinning medications ?-If she feels a small mass developing in that area, advised her to follow-up with me so we could schedule excision of sebaceous cyst.  Part of her sebaceous cyst was likely removed with incision and drainage but she is at risk for redevelopment of her cyst as the entire cyst wall was not removed ?-Advised her to follow back up if she experiences symptoms similar to symptoms she is experiencing prior to incision and drainage ?-Also advised her  to not pick or attempt to pop any future cyst, as this could result in increased risk for abscess formation ?-Follow up as needed ? ?All of the above recommendations were discussed with the patient and patient's family, and all of patient's and family's questions were answered to their expressed satisfaction. ? ?Graciella Freer, DO ?[facility] ?[place], [place] [address] ?[phone] (office) ?

## 2021-06-11 ENCOUNTER — Ambulatory Visit
Admission: RE | Admit: 2021-06-11 | Discharge: 2021-06-11 | Disposition: A | Discharge status: Home / Self Care | Payer: Medicare Other | Source: Ambulatory Visit | Attending: Orthopedic Surgery | Admitting: Orthopedic Surgery

## 2021-06-11 DIAGNOSIS — M545 Low back pain, unspecified: Secondary | ICD-10-CM | POA: Diagnosis not present

## 2021-06-11 DIAGNOSIS — M47816 Spondylosis without myelopathy or radiculopathy, lumbar region: Secondary | ICD-10-CM | POA: Diagnosis not present

## 2021-06-11 DIAGNOSIS — M5410 Radiculopathy, site unspecified: Secondary | ICD-10-CM

## 2021-06-12 ENCOUNTER — Other Ambulatory Visit: Payer: Self-pay | Admitting: Vascular Surgery

## 2021-06-14 DIAGNOSIS — M5441 Lumbago with sciatica, right side: Secondary | ICD-10-CM | POA: Diagnosis not present

## 2021-06-15 ENCOUNTER — Telehealth (INDEPENDENT_AMBULATORY_CARE_PROVIDER_SITE_OTHER): Payer: Self-pay | Admitting: Nurse Practitioner

## 2021-06-15 NOTE — Telephone Encounter (Signed)
Bring her in with a DVT and abi study sometime next, she can see me or dew

## 2021-06-15 NOTE — Telephone Encounter (Signed)
Patient wants an appointment.  She states she thinks she have a blood clot in her right leg.  When she raise the leg up and lift toe forward, she has very bad pain.  Please advise ?

## 2021-06-15 NOTE — Telephone Encounter (Signed)
I spoke with patient and she stated that she has no swelling or redness. Patient informed that the back of the right calf is tender to the touch. ?

## 2021-06-18 ENCOUNTER — Ambulatory Visit (INDEPENDENT_AMBULATORY_CARE_PROVIDER_SITE_OTHER): Payer: Medicare Other | Admitting: Nurse Practitioner

## 2021-06-19 ENCOUNTER — Other Ambulatory Visit (INDEPENDENT_AMBULATORY_CARE_PROVIDER_SITE_OTHER): Payer: Self-pay | Admitting: Nurse Practitioner

## 2021-06-19 DIAGNOSIS — M79661 Pain in right lower leg: Secondary | ICD-10-CM

## 2021-06-19 DIAGNOSIS — Z9889 Other specified postprocedural states: Secondary | ICD-10-CM

## 2021-06-20 ENCOUNTER — Ambulatory Visit (INDEPENDENT_AMBULATORY_CARE_PROVIDER_SITE_OTHER): Payer: Medicare Other

## 2021-06-20 DIAGNOSIS — I739 Peripheral vascular disease, unspecified: Secondary | ICD-10-CM | POA: Diagnosis not present

## 2021-06-20 DIAGNOSIS — M79661 Pain in right lower leg: Secondary | ICD-10-CM

## 2021-06-20 DIAGNOSIS — Z9889 Other specified postprocedural states: Secondary | ICD-10-CM

## 2021-06-21 ENCOUNTER — Ambulatory Visit (INDEPENDENT_AMBULATORY_CARE_PROVIDER_SITE_OTHER): Payer: Medicare Other | Admitting: Nurse Practitioner

## 2021-06-21 ENCOUNTER — Encounter (INDEPENDENT_AMBULATORY_CARE_PROVIDER_SITE_OTHER): Payer: Self-pay

## 2021-06-27 ENCOUNTER — Inpatient Hospital Stay (HOSPITAL_COMMUNITY): Payer: Medicare Other | Attending: Hematology

## 2021-06-27 DIAGNOSIS — Z820 Family history of epilepsy and other diseases of the nervous system: Secondary | ICD-10-CM | POA: Insufficient documentation

## 2021-06-27 DIAGNOSIS — I10 Essential (primary) hypertension: Secondary | ICD-10-CM | POA: Diagnosis not present

## 2021-06-27 DIAGNOSIS — Z809 Family history of malignant neoplasm, unspecified: Secondary | ICD-10-CM | POA: Insufficient documentation

## 2021-06-27 DIAGNOSIS — Z79899 Other long term (current) drug therapy: Secondary | ICD-10-CM | POA: Diagnosis not present

## 2021-06-27 DIAGNOSIS — F17210 Nicotine dependence, cigarettes, uncomplicated: Secondary | ICD-10-CM | POA: Insufficient documentation

## 2021-06-27 DIAGNOSIS — M7989 Other specified soft tissue disorders: Secondary | ICD-10-CM | POA: Diagnosis not present

## 2021-06-27 DIAGNOSIS — Z86010 Personal history of colonic polyps: Secondary | ICD-10-CM | POA: Insufficient documentation

## 2021-06-27 DIAGNOSIS — Z9884 Bariatric surgery status: Secondary | ICD-10-CM | POA: Diagnosis not present

## 2021-06-27 DIAGNOSIS — Z9049 Acquired absence of other specified parts of digestive tract: Secondary | ICD-10-CM | POA: Diagnosis not present

## 2021-06-27 DIAGNOSIS — E119 Type 2 diabetes mellitus without complications: Secondary | ICD-10-CM | POA: Insufficient documentation

## 2021-06-27 DIAGNOSIS — I252 Old myocardial infarction: Secondary | ICD-10-CM | POA: Diagnosis not present

## 2021-06-27 DIAGNOSIS — R5383 Other fatigue: Secondary | ICD-10-CM | POA: Diagnosis not present

## 2021-06-27 DIAGNOSIS — Z8249 Family history of ischemic heart disease and other diseases of the circulatory system: Secondary | ICD-10-CM | POA: Insufficient documentation

## 2021-06-27 DIAGNOSIS — Z8719 Personal history of other diseases of the digestive system: Secondary | ICD-10-CM | POA: Diagnosis not present

## 2021-06-27 DIAGNOSIS — Z833 Family history of diabetes mellitus: Secondary | ICD-10-CM | POA: Diagnosis not present

## 2021-06-27 DIAGNOSIS — Z880 Allergy status to penicillin: Secondary | ICD-10-CM | POA: Diagnosis not present

## 2021-06-27 DIAGNOSIS — Z808 Family history of malignant neoplasm of other organs or systems: Secondary | ICD-10-CM | POA: Diagnosis not present

## 2021-06-27 DIAGNOSIS — D509 Iron deficiency anemia, unspecified: Secondary | ICD-10-CM | POA: Diagnosis not present

## 2021-06-27 DIAGNOSIS — I4819 Other persistent atrial fibrillation: Secondary | ICD-10-CM | POA: Insufficient documentation

## 2021-06-27 DIAGNOSIS — Z814 Family history of other substance abuse and dependence: Secondary | ICD-10-CM | POA: Diagnosis not present

## 2021-06-27 DIAGNOSIS — M2550 Pain in unspecified joint: Secondary | ICD-10-CM | POA: Insufficient documentation

## 2021-06-27 DIAGNOSIS — M549 Dorsalgia, unspecified: Secondary | ICD-10-CM | POA: Insufficient documentation

## 2021-06-27 DIAGNOSIS — Z881 Allergy status to other antibiotic agents status: Secondary | ICD-10-CM | POA: Diagnosis not present

## 2021-06-27 LAB — CBC WITH DIFFERENTIAL/PLATELET
Abs Immature Granulocytes: 0.05 10*3/uL (ref 0.00–0.07)
Basophils Absolute: 0 10*3/uL (ref 0.0–0.1)
Basophils Relative: 0 %
Eosinophils Absolute: 0.1 10*3/uL (ref 0.0–0.5)
Eosinophils Relative: 1 %
HCT: 43.4 % (ref 36.0–46.0)
Hemoglobin: 14.4 g/dL (ref 12.0–15.0)
Immature Granulocytes: 1 %
Lymphocytes Relative: 22 %
Lymphs Abs: 2 10*3/uL (ref 0.7–4.0)
MCH: 30 pg (ref 26.0–34.0)
MCHC: 33.2 g/dL (ref 30.0–36.0)
MCV: 90.4 fL (ref 80.0–100.0)
Monocytes Absolute: 0.8 10*3/uL (ref 0.1–1.0)
Monocytes Relative: 8 %
Neutro Abs: 6.2 10*3/uL (ref 1.7–7.7)
Neutrophils Relative %: 68 %
Platelets: 257 10*3/uL (ref 150–400)
RBC: 4.8 MIL/uL (ref 3.87–5.11)
RDW: 14.4 % (ref 11.5–15.5)
WBC: 9.1 10*3/uL (ref 4.0–10.5)
nRBC: 0 % (ref 0.0–0.2)

## 2021-06-27 LAB — VITAMIN B12: Vitamin B-12: 379 pg/mL (ref 180–914)

## 2021-06-27 LAB — FERRITIN: Ferritin: 255 ng/mL (ref 11–307)

## 2021-06-27 LAB — IRON AND TIBC
Iron: 78 ug/dL (ref 28–170)
Saturation Ratios: 22 % (ref 10.4–31.8)
TIBC: 355 ug/dL (ref 250–450)
UIBC: 277 ug/dL

## 2021-06-28 ENCOUNTER — Encounter (HOSPITAL_COMMUNITY): Payer: Self-pay | Admitting: Hematology

## 2021-07-02 LAB — METHYLMALONIC ACID, SERUM: Methylmalonic Acid, Quantitative: 171 nmol/L (ref 0–378)

## 2021-07-03 NOTE — Progress Notes (Unsigned)
Amy Tran, [place] [id number]   CLINIC:  Medical Oncology/Hematology  PCP:  Asencion Noble, MD [address] [place] [address] [phone]   REASON FOR VISIT:  Follow-up for iron deficiency anemia  PRIOR THERAPY: Oral iron tablets  CURRENT THERAPY: Intermittent IV iron infusions  INTERVAL HISTORY:  Amy Tran 66 y.o. female returns for routine follow-up of her iron deficiency anemia.  She was last seen by Tarri Abernethy PA-C on 03/28/2021.  At today's visit, she reports feeling fair.  No recent hospitalizations, surgeries, or changes in baseline health status.  She has been having significant problems with back pain and sciatic nerve pain of her right leg.  She does not report any significant improvements in her energy after her most recent IV iron.  She is still fatigued, but notes that her pica cravings for ice have resolved.  She has not noticed any signs of blood loss such as epistaxis, bright red blood per rectum, or melena.  She denies any pica, restless legs, headaches, chest pain, dyspnea on exertion, lightheadedness, or syncope.  She continues to follow closely with vascular specialist due to extensive peripheral arterial and peripheral venous disease.  She has little to no energy and low appetite. She endorses that she is maintaining a stable weight.   REVIEW OF SYSTEMS:  Review of Systems  Constitutional:  Positive for fatigue. Negative for appetite change, chills, diaphoresis, fever and unexpected weight change.  HENT:   Negative for lump/mass and nosebleeds.   Eyes:  Negative for eye problems.  Respiratory:  Negative for cough, hemoptysis and shortness of breath.   Cardiovascular:  Positive for leg swelling. Negative for chest pain and palpitations.  Gastrointestinal:  Negative for abdominal pain, blood in stool, constipation, diarrhea, nausea and vomiting.  Genitourinary:  Negative for hematuria.        Recent  UTI  Musculoskeletal:  Positive for arthralgias and back pain.  Skin: Negative.   Neurological:  Negative for dizziness, headaches and light-headedness.  Hematological:  Does not bruise/bleed easily.  Psychiatric/Behavioral:  Positive for sleep disturbance.      PAST MEDICAL/SURGICAL HISTORY:  Past Medical History:  Diagnosis Date   Arthritis    Asthma    Cancer ([facility])    multiple skin cancers   Diabetes mellitus without complication ([facility])    Diverticulosis    Fluid retention    GERD (gastroesophageal reflux disease)    High cholesterol    Hypertension    Incomplete rotator cuff tear    Myocardial infarction ([place])    Obesity    Persistent atrial fibrillation ([facility])    PONV (postoperative nausea and vomiting)    Wears glasses    Wears partial dentures    bottom   Past Surgical History:  Procedure Laterality Date   APPENDECTOMY     CARDIOVERSION N/A 06/03/2019   Procedure: CARDIOVERSION;  Surgeon: Herminio Commons, MD;  Location: [facility];  Service: Cardiovascular;  Laterality: N/A;   CARDIOVERSION N/A 07/13/2019   Procedure: CARDIOVERSION;  Surgeon: Geralynn Rile, MD;  Location: [facility];  Service: Cardiovascular;  Laterality: N/A;   CARPAL TUNNEL RELEASE Bilateral 2001   bilateral   CERVICAL POLYPECTOMY  04/08/2017   Procedure: POLYPECTOMY;  Surgeon: [name], MD;  Location: [facility];  Service: Gynecology;;  Endometrial   CHOLECYSTECTOMY     COLONOSCOPY  2007   [id number] internal hemorrhoids. Diminutive rectal polyp at 10 cm, cold biopsied/removed. The remainder of the  rectal mucosa appeared normal Swallow left-sided diverticula. Diminutive polyp at the splenic flexure cold biopsied/removed (adenomatous)   COLONOSCOPY N/A 12/28/2013   Procedure: COLONOSCOPY;  Surgeon: Daneil Dolin, MD;  [facility];  Service: Endoscopy;  Laterality: N/A;  730 - moved to 8:30 - Ginger notified pt   CORONARY STENT INTERVENTION N/A 04/27/2019   Procedure:  CORONARY STENT INTERVENTION;  Surgeon: Troy Sine, MD;  Location: [facility];  Service: Cardiovascular;  Laterality: N/A;   DILATION AND CURETTAGE, DIAGNOSTIC / THERAPEUTIC  03/07/2017   ERCP  2002   GASTRIC BYPASS  2004   HYSTEROSCOPY WITH D & C N/A 04/08/2017   Procedure: DILATATION AND CURETTAGE /HYSTEROSCOPY;  Surgeon: [name], MD;  Location: [facility];  Service: Gynecology;  Laterality: N/A;   INTRAVASCULAR ULTRASOUND/IVUS N/A 09/12/2017   Procedure: INTRAVASCULAR ULTRASOUND/IVUS;  Surgeon: Elam Dutch, MD;  Location: [facility];  Service: Cardiovascular;  Laterality: N/A;   LEFT HEART CATH AND CORONARY ANGIOGRAPHY N/A 04/27/2019   Procedure: LEFT HEART CATH AND CORONARY ANGIOGRAPHY;  Surgeon: Troy Sine, MD;  Location: [facility];  Service: Cardiovascular;  Laterality: N/A;   LOWER EXTREMITY ANGIOGRAPHY Left 10/02/2020   Procedure: LOWER EXTREMITY ANGIOGRAPHY;  Surgeon: Algernon Huxley, MD;  Location: [facility];  Service: Cardiovascular;  Laterality: Left;   NECK SURGERY  1998   fusion   PERCUTANEOUS VENOUS THROMBECTOMY,LYSIS WITH INTRAVASCULAR ULTRASOUND (IVUS) Left 09/13/2017   Procedure: REMOVAL OF LYSIS CATHETER AND INTRAVASCULAR ULTRASOUND (IVUS) LEFT ILIAC VEIN;  Surgeon: Elam Dutch, MD;  Location: [facility];  Service: Vascular;  Laterality: Left;   PERIPHERAL VASCULAR INTERVENTION  09/15/2017   Procedure: PERIPHERAL VASCULAR INTERVENTION;  Surgeon: Waynetta Sandy, MD;  Location: [facility];  Service: Cardiovascular;;  VEINOUS/STENT   PERIPHERAL VASCULAR THROMBECTOMY Left 09/12/2017   Procedure: PERIPHERAL VASCULAR THROMBECTOMY;  Surgeon: Elam Dutch, MD;  Location: [facility];  Service: Cardiovascular;  Laterality: Left;   PERIPHERAL VASCULAR THROMBECTOMY Left 09/15/2017   Procedure: PERIPHERAL VASCULAR THROMBECTOMY;  Surgeon: Waynetta Sandy, MD;  Location: [facility];  Service:  Cardiovascular;  Laterality: Left;  IVC TO LT FEM/POP VEIN   SHOULDER ARTHROSCOPY WITH SUBACROMIAL DECOMPRESSION Left 06/23/2013   Procedure: LEFT SHOULDER ARTHROSCOPY WITH DEBRIDEMENT ROTATOR CUFF AND LABRUM;  Surgeon: Lorn Junes, MD;  Location: [facility];  Service: Orthopedics;  Laterality: Left;   SHOULDER SURGERY  1999   left     SOCIAL HISTORY:  Social History   Socioeconomic History   Marital status: Widowed    Spouse name: Not on file   Number of children: 2   Years of education: Not on file   Highest education level: Not on file  Occupational History   Occupation: works at [employer] store    Employer: [employer]   Tobacco Use   Smoking status: Every Day    Packs/day: 0.30    Years: 29.00    Pack years: 8.70    Types: Cigarettes    Start date: 04/09/1970   Smokeless tobacco: Never   Tobacco comments:    09/04/20 - states she is down to 9 ciggs / day   Vaping Use   Vaping Use: Never used  Substance and Sexual Activity   Alcohol use: Not Currently   Drug use: No   Sexual activity: Not Currently    Birth control/protection: Post-menopausal  Other Topics Concern   Not on  file  Social History Narrative   Not on file   Social Determinants of Health   Financial Resource Strain: Not on file  Food Insecurity: Not on file  Transportation Needs: Not on file  Physical Activity: Not on file  Stress: Not on file  Social Connections: Not on file  Intimate Partner Violence: Not on file    FAMILY HISTORY:  Family History  Problem Relation Age of Onset   Diabetes Father    Hypertension Father    Parkinson's disease Father    Cancer Father        not sure what kind   Hypertension Mother    Heart disease Mother    Drug abuse Sister    Mesothelioma Brother    Cancer Daughter        non-hodgkins lmphoma   Hypertension Brother    Diabetes Brother    Colon cancer Neg Hx    Gastric cancer Neg Hx    Esophageal cancer Neg Hx     CURRENT MEDICATIONS:   Outpatient Encounter Medications as of 07/04/2021  Medication Sig   acetaminophen (TYLENOL) 500 MG tablet Take 500 mg by mouth every 6 (six) hours as needed for mild pain or headache.   albuterol (VENTOLIN HFA) 108 (90 Base) MCG/ACT inhaler Inhale 2 puffs into the lungs every 6 (six) hours as needed for wheezing or shortness of breath.   atorvastatin (LIPITOR) 80 MG tablet TAKE 1 TABLET BY MOUTH DAILY AT 6 PM.   BD PEN NEEDLE NANO U/F 32G X 4 MM MISC USE FOR VICTOZA INJECTIONS   cetirizine (ZYRTEC) 10 MG tablet Take 10 mg by mouth at bedtime.   clopidogrel (PLAVIX) 75 MG tablet TAKE 1 TABLET (75 MG TOTAL) BY MOUTH DAILY WITH BREAKFAST.   dofetilide (TIKOSYN) 250 MCG capsule TAKE 1 CAPSULE BY MOUTH TWICE A DAY   fluticasone (FLONASE) 50 MCG/ACT nasal spray Place 2 sprays into both nostrils daily as needed for rhinitis (congestion/ dryness).   hydrOXYzine (ATARAX) 25 MG tablet Take 25 mg by mouth every 6 (six) hours as needed.   JARDIANCE 10 MG TABS tablet Take 10 mg by mouth daily.   losartan (COZAAR) 100 MG tablet Take 100 mg by mouth daily with breakfast.    nitroGLYCERIN (NITROSTAT) 0.4 MG SL tablet Place 1 tablet (0.4 mg total) under the tongue every 5 (five) minutes as needed for chest pain.   potassium chloride SA (KLOR-CON) 20 MEQ tablet Take 1 tablet (20 mEq total) by mouth 2 (two) times daily.   spironolactone (ALDACTONE) 25 MG tablet Take 25 mg by mouth daily with breakfast.    tiZANidine (ZANAFLEX) 4 MG tablet Take 1 tablet (4 mg total) by mouth every 6 (six) hours as needed for muscle spasms.   torsemide (DEMADEX) 20 MG tablet Take 60 mg by mouth daily after breakfast.    VICTOZA 18 MG/3ML SOPN Inject 1.8 mg into the skin at bedtime.    VITAMIN D PO Take 2 capsules by mouth daily.   XARELTO 20 MG TABS tablet TAKE 1 TABLET BY MOUTH EVERYDAY AT BEDTIME   No facility-administered encounter medications on file as of 07/04/2021.    ALLERGIES:  Allergies  Allergen Reactions    Penicillins Hives and Rash    Has patient had a PCN reaction causing immediate rash, facial/tongue/throat swelling, SOB or lightheadedness with hypotension: Yes Has patient had a PCN reaction causing severe rash involving mucus membranes or skin necrosis: No Has patient had a PCN reaction that required  hospitalization No Has patient had a PCN reaction occurring within the last 10 years: No If all of the above answers are "NO", then may proceed with Cephalosporin use.    Doxycycline Itching     PHYSICAL EXAM:  ECOG PERFORMANCE STATUS: 1 - Symptomatic but completely ambulatory  There were no vitals filed for this visit. There were no vitals filed for this visit. Physical Exam Constitutional:      Appearance: Normal appearance. She is morbidly obese.  HENT:     Head: Normocephalic and atraumatic.     Mouth/Throat:     Mouth: Mucous membranes are moist.  Eyes:     Extraocular Movements: Extraocular movements intact.     Pupils: Pupils are equal, round, and reactive to light.  Cardiovascular:     Rate and Rhythm: Normal rate and regular rhythm.     Pulses: Normal pulses.     Heart sounds: Murmur heard.  Pulmonary:     Effort: Pulmonary effort is normal.     Breath sounds: Normal breath sounds.  Abdominal:     General: Bowel sounds are normal.     Palpations: Abdomen is soft.     Tenderness: There is no abdominal tenderness.  Musculoskeletal:        General: No swelling.     Right lower leg: Edema present.     Left lower leg: Edema (chronic lymphedema of bilateral lower extremities) present.  Lymphadenopathy:     Cervical: No cervical adenopathy.  Skin:    General: Skin is warm and dry.     Comments: Dusky erythema of bilateral lower extremities secondary to venous stasis dermatitis   Neurological:     General: No focal deficit present.     Mental Status: She is alert and oriented to person, place, and time.  Psychiatric:        Mood and Affect: Mood normal.         Behavior: Behavior normal.     LABORATORY DATA:  I have reviewed the labs as listed.  CBC    Component Value Date/Time   WBC 9.1 06/27/2021 1045   RBC 4.80 06/27/2021 1045   HGB 14.4 06/27/2021 1045   HGB 12.2 06/02/2020 0946   HCT 43.4 06/27/2021 1045   HCT 38.0 06/02/2020 0946   PLT 257 06/27/2021 1045   PLT 329 06/02/2020 0946   MCV 90.4 06/27/2021 1045   MCV 83 06/02/2020 0946   MCH 30.0 06/27/2021 1045   MCHC 33.2 06/27/2021 1045   RDW 14.4 06/27/2021 1045   RDW 14.4 06/02/2020 0946   LYMPHSABS 2.0 06/27/2021 1045   MONOABS 0.8 06/27/2021 1045   EOSABS 0.1 06/27/2021 1045   BASOSABS 0.0 06/27/2021 1045      Latest Ref Rng & Units 03/06/2021   10:04 AM 10/02/2020    8:14 AM 02/10/2020   10:50 AM  CMP  Glucose 70 - 99 mg/dL 157    146    BUN 8 - 23 mg/dL '11   13   14    '$ Creatinine 0.44 - 1.00 mg/dL 0.70   0.74   0.81    Sodium 135 - 145 mmol/L 134    134    Potassium 3.5 - 5.1 mmol/L 3.7    4.0    Chloride 98 - 111 mmol/L 103    100    CO2 22 - 32 mmol/L 26    25    Calcium 8.9 - 10.3 mg/dL 9.1    8.9  Total Protein 6.5 - 8.1 g/dL 7.6      Total Bilirubin 0.3 - 1.2 mg/dL 1.2      Alkaline Phos 38 - 126 U/L 95      AST 15 - 41 U/L 148      ALT 0 - 44 U/L 101        DIAGNOSTIC IMAGING:  I have independently reviewed the relevant imaging and discussed with the patient.  ASSESSMENT & PLAN: 1.  Iron deficiency state: - Patient seen at the request of Dr. Willey Blade for further management of iron deficiency state. - Labs from 12/11/2020 showed hemoglobin 12.4, MCV 78, low MCH and MCHC and high RDW consistent with iron deficiency state.  Ferritin was 22. - She could not tolerate iron tablets in the past which caused diarrhea.  Denies any prior history of transfusion. - Last colonoscopy on 12/28/2013: Colonic diverticulosis. - She is on Xarelto and Plavix for A. fib and peripheral vascular disease. - Additional work-up revealed normal vitamin B12, folate, LDH, SPEP,  and reticulocytes  -- Unable to tolerate oral iron due to diarrhea - She complained of extreme fatigue prior to IV iron infusions, but this improved after IV iron, most recently given on 03/30/2021 and 04/09/2021 - She continues to have significant fatigue, but her pica cravings for ice have resolved   - No bright red blood per rectum or melena   - Most recent labs (06/27/2021): Hgb 14.4/MCV 90.4, ferritin 255, iron saturation 22% - PLAN: No indication for IV iron at this time - Repeat labs and RTC in 6 months.   2.  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 - She is known to have extensive peripheral vascular disease, and is followed by vascular surgery. - She has had leg claudication symptoms related to atherosclerosis of the native artery of the left leg with rest pain. - On 10/02/2020 she had left leg balloon angioplasty of left posterior tibial artery, mechanical thrombectomy of the proximal left popliteal artery, and balloon angioplasty of proximal left popliteal artery, and stent placement to proximal left popliteal artery. - She follows with [facility] - She is taking Xarelto and Plavix    - Exam shows chronic lymphedema of left leg with left calf circumference markedly larger than right leg.  Patient states that this is chronic and unchanged.   -- She continues to follow closely with vascular surgery.  Most recent venous duplex on 06/20/2021 of right leg was negative for DVT. - PLAN: Patient instructed to continue Xarelto and to continue follow-up with her established vein and vascular specialists.   3. Social/family history: - Lives at home  with her daughter.  Independent of ADLs and IADLs.  She works as a Environmental education officer at [employer].  She started smoking at age 82, 2 to 3 packs/day and quit for 16 years.  Restarted back again 5 years back when her husband died.  She is currently smoking 10 cigarettes/day now. - Father, maternal grandfather and maternal grandmother had cancers.  Type unknown to the patient.    All questions were answered. The patient knows to call the clinic with any problems, questions or concerns.  Medical decision making: Low  Time spent on visit: I spent 15 minutes counseling the patient face to face. The total time spent in the appointment was 22 minutes and more than 50% was on counseling.   Harriett Rush, PA-C  07/04/2021 12:03 PM

## 2021-07-04 ENCOUNTER — Inpatient Hospital Stay (HOSPITAL_BASED_OUTPATIENT_CLINIC_OR_DEPARTMENT_OTHER): Payer: Medicare Other | Admitting: Physician Assistant

## 2021-07-04 VITALS — BP 149/67 | HR 66 | Temp 97.8°F | Resp 18 | Ht 64.0 in | Wt 266.5 lb

## 2021-07-04 DIAGNOSIS — Z79899 Other long term (current) drug therapy: Secondary | ICD-10-CM | POA: Diagnosis not present

## 2021-07-04 DIAGNOSIS — Z8719 Personal history of other diseases of the digestive system: Secondary | ICD-10-CM | POA: Diagnosis not present

## 2021-07-04 DIAGNOSIS — M2550 Pain in unspecified joint: Secondary | ICD-10-CM | POA: Diagnosis not present

## 2021-07-04 DIAGNOSIS — D509 Iron deficiency anemia, unspecified: Secondary | ICD-10-CM

## 2021-07-04 DIAGNOSIS — I252 Old myocardial infarction: Secondary | ICD-10-CM | POA: Diagnosis not present

## 2021-07-04 DIAGNOSIS — F17210 Nicotine dependence, cigarettes, uncomplicated: Secondary | ICD-10-CM | POA: Diagnosis not present

## 2021-07-04 DIAGNOSIS — I4819 Other persistent atrial fibrillation: Secondary | ICD-10-CM | POA: Diagnosis not present

## 2021-07-04 DIAGNOSIS — E119 Type 2 diabetes mellitus without complications: Secondary | ICD-10-CM | POA: Diagnosis not present

## 2021-07-04 DIAGNOSIS — Z9049 Acquired absence of other specified parts of digestive tract: Secondary | ICD-10-CM | POA: Diagnosis not present

## 2021-07-04 DIAGNOSIS — Z809 Family history of malignant neoplasm, unspecified: Secondary | ICD-10-CM | POA: Diagnosis not present

## 2021-07-04 DIAGNOSIS — Z880 Allergy status to penicillin: Secondary | ICD-10-CM | POA: Diagnosis not present

## 2021-07-04 DIAGNOSIS — R5383 Other fatigue: Secondary | ICD-10-CM | POA: Diagnosis not present

## 2021-07-04 DIAGNOSIS — Z808 Family history of malignant neoplasm of other organs or systems: Secondary | ICD-10-CM | POA: Diagnosis not present

## 2021-07-04 DIAGNOSIS — Z8249 Family history of ischemic heart disease and other diseases of the circulatory system: Secondary | ICD-10-CM | POA: Diagnosis not present

## 2021-07-04 DIAGNOSIS — M7989 Other specified soft tissue disorders: Secondary | ICD-10-CM | POA: Diagnosis not present

## 2021-07-04 DIAGNOSIS — M549 Dorsalgia, unspecified: Secondary | ICD-10-CM | POA: Diagnosis not present

## 2021-07-04 DIAGNOSIS — Z86010 Personal history of colonic polyps: Secondary | ICD-10-CM | POA: Diagnosis not present

## 2021-07-04 DIAGNOSIS — I10 Essential (primary) hypertension: Secondary | ICD-10-CM | POA: Diagnosis not present

## 2021-07-04 DIAGNOSIS — Z833 Family history of diabetes mellitus: Secondary | ICD-10-CM | POA: Diagnosis not present

## 2021-07-04 DIAGNOSIS — Z881 Allergy status to other antibiotic agents status: Secondary | ICD-10-CM | POA: Diagnosis not present

## 2021-07-04 DIAGNOSIS — Z9884 Bariatric surgery status: Secondary | ICD-10-CM | POA: Diagnosis not present

## 2021-07-04 NOTE — Patient Instructions (Signed)
[place] at [facility] Discharge Instructions  You were seen today by Tarri Abernethy PA-C for your iron deficiency.  Your iron levels look great!  You do not need any IV iron at this time.  We will see you back in 6 months for follow-up.     Thank you for choosing [place] at [facility] to provide your oncology and hematology care.  To afford each patient quality time with our provider, please arrive at least 15 minutes before your scheduled appointment time.   If you have a lab appointment with the [facility] please come in thru the Main Entrance and check in at the main information desk.  You need to re-schedule your appointment should you arrive 10 or more minutes late.  We strive to give you quality time with our providers, and arriving late affects you and other patients whose appointments are after yours.  Also, if you no show three or more times for appointments you may be dismissed from the clinic at the providers discretion.     Again, thank you for choosing [facility].  Our hope is that these requests will decrease the amount of time that you wait before being seen by our physicians.       _____________________________________________________________  Should you have questions after your visit to [facility], please contact our office at [phone] and follow the prompts.  Our office hours are 8:00 a.m. and 4:30 p.m. Monday - Friday.  Please note that voicemails left after 4:00 p.m. may not be returned until the following business day.  We are closed weekends and major holidays.  You do have access to a nurse 24-7, just call the main number to the clinic [phone] and do not press any options, hold on the line and a nurse will answer the phone.    For prescription refill requests, have your pharmacy contact our office and allow 72 hours.    Due to Covid, you will need to wear a mask upon entering  the hospital. If you do not have a mask, a mask will be given to you at the Main Entrance upon arrival. For doctor visits, patients may have 1 support person age 19 or older with them. For treatment visits, patients can not have anyone with them due to social distancing guidelines and our immunocompromised population.

## 2021-07-10 DIAGNOSIS — M5136 Other intervertebral disc degeneration, lumbar region: Secondary | ICD-10-CM | POA: Diagnosis not present

## 2021-07-10 DIAGNOSIS — M545 Low back pain, unspecified: Secondary | ICD-10-CM | POA: Diagnosis not present

## 2021-08-08 DIAGNOSIS — M545 Low back pain, unspecified: Secondary | ICD-10-CM | POA: Diagnosis not present

## 2021-08-13 DIAGNOSIS — M5416 Radiculopathy, lumbar region: Secondary | ICD-10-CM | POA: Diagnosis not present

## 2021-08-13 DIAGNOSIS — I890 Lymphedema, not elsewhere classified: Secondary | ICD-10-CM | POA: Diagnosis not present

## 2021-08-26 ENCOUNTER — Encounter (HOSPITAL_COMMUNITY): Payer: Self-pay | Admitting: Pediatrics

## 2021-08-26 ENCOUNTER — Other Ambulatory Visit: Payer: Self-pay

## 2021-08-26 ENCOUNTER — Observation Stay (HOSPITAL_COMMUNITY): Payer: Medicare Other

## 2021-08-26 ENCOUNTER — Encounter (HOSPITAL_COMMUNITY): Payer: Self-pay | Admitting: Hematology

## 2021-08-26 ENCOUNTER — Inpatient Hospital Stay (HOSPITAL_COMMUNITY)
Admission: EM | Admit: 2021-08-26 | Discharge: 2021-08-29 | DRG: 872 | Disposition: A | Discharge status: Home / Self Care | Payer: Medicare Other | Attending: Family Medicine | Admitting: Family Medicine

## 2021-08-26 ENCOUNTER — Emergency Department (HOSPITAL_COMMUNITY): Payer: Medicare Other

## 2021-08-26 DIAGNOSIS — E1165 Type 2 diabetes mellitus with hyperglycemia: Secondary | ICD-10-CM | POA: Diagnosis present

## 2021-08-26 DIAGNOSIS — M47816 Spondylosis without myelopathy or radiculopathy, lumbar region: Secondary | ICD-10-CM | POA: Diagnosis present

## 2021-08-26 DIAGNOSIS — I10 Essential (primary) hypertension: Secondary | ICD-10-CM | POA: Diagnosis not present

## 2021-08-26 DIAGNOSIS — Z833 Family history of diabetes mellitus: Secondary | ICD-10-CM

## 2021-08-26 DIAGNOSIS — Z880 Allergy status to penicillin: Secondary | ICD-10-CM

## 2021-08-26 DIAGNOSIS — E782 Mixed hyperlipidemia: Secondary | ICD-10-CM | POA: Diagnosis not present

## 2021-08-26 DIAGNOSIS — Z7984 Long term (current) use of oral hypoglycemic drugs: Secondary | ICD-10-CM

## 2021-08-26 DIAGNOSIS — I4819 Other persistent atrial fibrillation: Secondary | ICD-10-CM | POA: Diagnosis not present

## 2021-08-26 DIAGNOSIS — Z20822 Contact with and (suspected) exposure to covid-19: Secondary | ICD-10-CM | POA: Diagnosis present

## 2021-08-26 DIAGNOSIS — B3731 Acute candidiasis of vulva and vagina: Secondary | ICD-10-CM | POA: Diagnosis not present

## 2021-08-26 DIAGNOSIS — F17210 Nicotine dependence, cigarettes, uncomplicated: Secondary | ICD-10-CM | POA: Diagnosis present

## 2021-08-26 DIAGNOSIS — R918 Other nonspecific abnormal finding of lung field: Secondary | ICD-10-CM | POA: Diagnosis not present

## 2021-08-26 DIAGNOSIS — K219 Gastro-esophageal reflux disease without esophagitis: Secondary | ICD-10-CM | POA: Diagnosis not present

## 2021-08-26 DIAGNOSIS — J45909 Unspecified asthma, uncomplicated: Secondary | ICD-10-CM | POA: Diagnosis not present

## 2021-08-26 DIAGNOSIS — E1169 Type 2 diabetes mellitus with other specified complication: Secondary | ICD-10-CM | POA: Diagnosis present

## 2021-08-26 DIAGNOSIS — A419 Sepsis, unspecified organism: Principal | ICD-10-CM | POA: Diagnosis present

## 2021-08-26 DIAGNOSIS — Z8249 Family history of ischemic heart disease and other diseases of the circulatory system: Secondary | ICD-10-CM

## 2021-08-26 DIAGNOSIS — E876 Hypokalemia: Secondary | ICD-10-CM | POA: Diagnosis present

## 2021-08-26 DIAGNOSIS — K429 Umbilical hernia without obstruction or gangrene: Secondary | ICD-10-CM | POA: Diagnosis not present

## 2021-08-26 DIAGNOSIS — K5730 Diverticulosis of large intestine without perforation or abscess without bleeding: Secondary | ICD-10-CM | POA: Diagnosis present

## 2021-08-26 DIAGNOSIS — E669 Obesity, unspecified: Secondary | ICD-10-CM | POA: Diagnosis present

## 2021-08-26 DIAGNOSIS — E278 Other specified disorders of adrenal gland: Secondary | ICD-10-CM | POA: Diagnosis not present

## 2021-08-26 DIAGNOSIS — I700 Atherosclerosis of aorta: Secondary | ICD-10-CM | POA: Diagnosis present

## 2021-08-26 DIAGNOSIS — Z85828 Personal history of other malignant neoplasm of skin: Secondary | ICD-10-CM

## 2021-08-26 DIAGNOSIS — E872 Acidosis, unspecified: Secondary | ICD-10-CM | POA: Diagnosis not present

## 2021-08-26 DIAGNOSIS — K760 Fatty (change of) liver, not elsewhere classified: Secondary | ICD-10-CM | POA: Diagnosis not present

## 2021-08-26 DIAGNOSIS — M1990 Unspecified osteoarthritis, unspecified site: Secondary | ICD-10-CM | POA: Diagnosis not present

## 2021-08-26 DIAGNOSIS — I252 Old myocardial infarction: Secondary | ICD-10-CM

## 2021-08-26 DIAGNOSIS — Z881 Allergy status to other antibiotic agents status: Secondary | ICD-10-CM

## 2021-08-26 DIAGNOSIS — N3000 Acute cystitis without hematuria: Secondary | ICD-10-CM | POA: Diagnosis not present

## 2021-08-26 DIAGNOSIS — I2510 Atherosclerotic heart disease of native coronary artery without angina pectoris: Secondary | ICD-10-CM | POA: Diagnosis present

## 2021-08-26 DIAGNOSIS — R6510 Systemic inflammatory response syndrome (SIRS) of non-infectious origin without acute organ dysfunction: Secondary | ICD-10-CM | POA: Diagnosis not present

## 2021-08-26 DIAGNOSIS — Z79899 Other long term (current) drug therapy: Secondary | ICD-10-CM

## 2021-08-26 DIAGNOSIS — Z7901 Long term (current) use of anticoagulants: Secondary | ICD-10-CM

## 2021-08-26 DIAGNOSIS — N390 Urinary tract infection, site not specified: Secondary | ICD-10-CM | POA: Diagnosis present

## 2021-08-26 DIAGNOSIS — B9620 Unspecified Escherichia coli [E. coli] as the cause of diseases classified elsewhere: Secondary | ICD-10-CM | POA: Diagnosis present

## 2021-08-26 DIAGNOSIS — R0982 Postnasal drip: Secondary | ICD-10-CM | POA: Diagnosis present

## 2021-08-26 DIAGNOSIS — Z9884 Bariatric surgery status: Secondary | ICD-10-CM

## 2021-08-26 DIAGNOSIS — Z7902 Long term (current) use of antithrombotics/antiplatelets: Secondary | ICD-10-CM

## 2021-08-26 DIAGNOSIS — R0981 Nasal congestion: Secondary | ICD-10-CM | POA: Diagnosis present

## 2021-08-26 DIAGNOSIS — Z6841 Body Mass Index (BMI) 40.0 and over, adult: Secondary | ICD-10-CM

## 2021-08-26 DIAGNOSIS — Z9049 Acquired absence of other specified parts of digestive tract: Secondary | ICD-10-CM

## 2021-08-26 DIAGNOSIS — Z955 Presence of coronary angioplasty implant and graft: Secondary | ICD-10-CM

## 2021-08-26 LAB — CBC WITH DIFFERENTIAL/PLATELET
Abs Immature Granulocytes: 0.1 10*3/uL — ABNORMAL HIGH (ref 0.00–0.07)
Basophils Absolute: 0 10*3/uL (ref 0.0–0.1)
Basophils Relative: 0 %
Eosinophils Absolute: 0 10*3/uL (ref 0.0–0.5)
Eosinophils Relative: 0 %
HCT: 45 % (ref 36.0–46.0)
Hemoglobin: 15 g/dL (ref 12.0–15.0)
Immature Granulocytes: 1 %
Lymphocytes Relative: 2 %
Lymphs Abs: 0.3 10*3/uL — ABNORMAL LOW (ref 0.7–4.0)
MCH: 30.7 pg (ref 26.0–34.0)
MCHC: 33.3 g/dL (ref 30.0–36.0)
MCV: 92.2 fL (ref 80.0–100.0)
Monocytes Absolute: 0.3 10*3/uL (ref 0.1–1.0)
Monocytes Relative: 2 %
Neutro Abs: 16.1 10*3/uL — ABNORMAL HIGH (ref 1.7–7.7)
Neutrophils Relative %: 95 %
Platelets: 217 10*3/uL (ref 150–400)
RBC: 4.88 MIL/uL (ref 3.87–5.11)
RDW: 13.9 % (ref 11.5–15.5)
WBC: 16.8 10*3/uL — ABNORMAL HIGH (ref 4.0–10.5)
nRBC: 0 % (ref 0.0–0.2)

## 2021-08-26 LAB — URINALYSIS, ROUTINE W REFLEX MICROSCOPIC
Bilirubin Urine: NEGATIVE
Bilirubin Urine: NEGATIVE
Glucose, UA: 150 mg/dL — AB
Glucose, UA: 150 mg/dL — AB
Hgb urine dipstick: NEGATIVE
Ketones, ur: NEGATIVE mg/dL
Ketones, ur: NEGATIVE mg/dL
Nitrite: POSITIVE — AB
Nitrite: NEGATIVE
Protein, ur: NEGATIVE mg/dL
Protein, ur: NEGATIVE mg/dL
Specific Gravity, Urine: 1.008 (ref 1.005–1.030)
Specific Gravity, Urine: 1.002 — ABNORMAL LOW (ref 1.005–1.030)
pH: 5 (ref 5.0–8.0)
pH: 5 (ref 5.0–8.0)

## 2021-08-26 LAB — COMPREHENSIVE METABOLIC PANEL
ALT: 18 U/L (ref 0–44)
AST: 33 U/L (ref 15–41)
Albumin: 3.6 g/dL (ref 3.5–5.0)
Alkaline Phosphatase: 73 U/L (ref 38–126)
Anion gap: 14 (ref 5–15)
BUN: 11 mg/dL (ref 8–23)
CO2: 20 mmol/L — ABNORMAL LOW (ref 22–32)
Calcium: 9.7 mg/dL (ref 8.9–10.3)
Chloride: 101 mmol/L (ref 98–111)
Creatinine, Ser: 0.8 mg/dL (ref 0.44–1.00)
GFR, Estimated: >60 mL/min (ref >60)
Glucose, Bld: 182 mg/dL — ABNORMAL HIGH (ref 70–99)
Potassium: 3.4 mmol/L — ABNORMAL LOW (ref 3.5–5.1)
Sodium: 135 mmol/L (ref 135–145)
Total Bilirubin: 1.8 mg/dL — ABNORMAL HIGH (ref 0.3–1.2)
Total Protein: 6.9 g/dL (ref 6.5–8.1)

## 2021-08-26 LAB — PROTIME-INR
INR: 1.5 — ABNORMAL HIGH (ref 0.8–1.2)
Prothrombin Time: 17.5 seconds — ABNORMAL HIGH (ref 11.4–15.2)

## 2021-08-26 LAB — APTT: aPTT: 26 s (ref 24–36)

## 2021-08-26 LAB — LACTIC ACID, PLASMA
Lactic Acid, Venous: 2.5 mmol/L (ref 0.5–1.9)
Lactic Acid, Venous: 2 mmol/L (ref 0.5–1.9)

## 2021-08-26 LAB — PROCALCITONIN: Procalcitonin: 4.2 ng/mL

## 2021-08-26 LAB — SARS CORONAVIRUS 2 BY RT PCR: SARS Coronavirus 2 by RT PCR: NEGATIVE

## 2021-08-26 MED ORDER — LACTATED RINGERS IV BOLUS (SEPSIS)
800.0000 mL | Freq: Once | INTRAVENOUS | Status: AC
  Administered 2021-08-26: 800 mL via INTRAVENOUS

## 2021-08-26 MED ORDER — AZTREONAM 2 G IJ SOLR
2.0000 g | Freq: Three times a day (TID) | INTRAMUSCULAR | Status: DC
Start: 2021-08-27 — End: 2021-08-27

## 2021-08-26 MED ORDER — VANCOMYCIN HCL 10 G IV SOLR
2250.0000 mg | Freq: Once | INTRAVENOUS | Status: AC
  Administered 2021-08-26: 2250 mg via INTRAVENOUS
  Filled 2021-08-26: qty 22.5

## 2021-08-26 MED ORDER — FLUCONAZOLE 150 MG PO TABS
150.0000 mg | ORAL_TABLET | Freq: Once | ORAL | Status: AC
  Administered 2021-08-26: 150 mg via ORAL
  Filled 2021-08-26: qty 1

## 2021-08-26 MED ORDER — VANCOMYCIN HCL 1750 MG/350ML IV SOLN: 1750.0000 mg | INTRAVENOUS | Status: DC

## 2021-08-26 MED ORDER — SODIUM CHLORIDE 0.9 % IV SOLN
2.0000 g | Freq: Once | INTRAVENOUS | Status: AC
  Administered 2021-08-26: 2 g via INTRAVENOUS
  Filled 2021-08-26: qty 10

## 2021-08-26 MED ORDER — METRONIDAZOLE 500 MG/100ML IV SOLN
500.0000 mg | Freq: Once | INTRAVENOUS | Status: AC
  Administered 2021-08-26: 500 mg via INTRAVENOUS
  Filled 2021-08-26: qty 100

## 2021-08-26 MED ORDER — VANCOMYCIN HCL IN DEXTROSE 1-5 GM/200ML-% IV SOLN
1000.0000 mg | Freq: Once | INTRAVENOUS | Status: DC
  Filled 2021-08-26: qty 200

## 2021-08-26 MED ORDER — LACTATED RINGERS IV SOLN: INTRAVENOUS | Status: DC

## 2021-08-26 MED ORDER — ACETAMINOPHEN 325 MG PO TABS
650.0000 mg | ORAL_TABLET | Freq: Once | ORAL | Status: AC
  Administered 2021-08-26: 650 mg via ORAL
  Filled 2021-08-26: qty 2

## 2021-08-26 MED ORDER — LACTATED RINGERS IV BOLUS (SEPSIS)
1000.0000 mL | Freq: Once | INTRAVENOUS | Status: AC
  Administered 2021-08-26: 1000 mL via INTRAVENOUS
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
## 2021-08-26 NOTE — Progress Notes (Signed)
Pharmacy Antibiotic Note  Amy Tran is a 66 y.o. female for which pharmacy has been consulted for aztreonam and vancomycin dosing for sepsis.  Patient with a history of asthma, cancer, DM, diverticulosis, GERD, HTN, MI, obesity, AF. Patient presenting with  fever, chills, dysuria, vaginal discharge, and vaginal irritation.  SCr 0.8 - at baseline WBC 16.8; LA 2; T 102.8>>100.7; HR 92>83; RR 22>20  Plan: Aztreonam 2g q8h Metronidazole per MD Vancomycin 2250 mg once then 1750 mg q24hr (eAUC 517.9) unless change in renal function Trend WBC, Fever, Renal function, & Clinical course F/u cultures, clinical course, WBC, fever De-escalate when able Levels at steady state  Height: '5\' 4"'$  (162.6 cm) Weight: 125.2 kg (276 lb) IBW/kg (Calculated) : 54.7  Temp (24hrs), Avg:101.8 F (38.8 C), Min:100.7 F (38.2 C), Max:102.8 F (39.3 C)  Recent Labs  Lab 08/26/21 1618  WBC 16.8*  CREATININE 0.80  LATICACIDVEN 2.5*    Estimated Creatinine Clearance: 90.5 mL/min (by C-G formula based on SCr of 0.8 mg/dL).    Allergies  Allergen Reactions   Penicillins Hives and Rash    Has patient had a PCN reaction causing immediate rash, facial/tongue/throat swelling, SOB or lightheadedness with hypotension: Yes Has patient had a PCN reaction causing severe rash involving mucus membranes or skin necrosis: No Has patient had a PCN reaction that required hospitalization No Has patient had a PCN reaction occurring within the last 10 years: No If all of the above answers are "NO", then may proceed with Cephalosporin use.    Doxycycline Itching   Antimicrobials this admission: aztreonam 7/16 >>  metronidazole 7/16 >>  vancomycin 7/16 >>   Microbiology results: Pending  Thank you for allowing pharmacy to be a part of this patient's care.  Lorelei Pont, PharmD, BCPS 08/26/2021 8:04 PM ED Clinical Pharmacist -  [phone]

## 2021-08-26 NOTE — ED Notes (Signed)
Patient transported to CT 

## 2021-08-26 NOTE — ED Provider Triage Note (Signed)
Emergency Medicine Provider Triage Evaluation Note  Amy Tran , a 66 y.o. female  was evaluated in triage.  Pt complains of chills starting yesterday.  Patient also reports increasing pressure in the pelvis area without dysuria or hematuria starting yesterday.  No cough.  She has had shaking chills at home.  Temperature of 102.8 F on arrival to the emergency department.  Review of Systems  Positive: Fever Negative: Vomiting  Physical Exam  BP 139/67 (BP Location: Left Arm)   Pulse 92   Temp (!) 102.8 F (39.3 C) (Oral)   Resp (!) 22   Ht '5\' 4"'$  (1.626 m)   Wt 125.2 kg   SpO2 91%   BMI 47.38 kg/m  Gen:   Awake, no distress   Resp:  Normal effort  MSK:   Moves extremities without difficulty  Other:  Abdomen is soft and nontender  Medical Decision Making  Medically screening exam initiated at 4:10 PM.  Appropriate orders placed.  Adria Dill was informed that the remainder of the evaluation will be completed by another provider, this initial triage assessment does not replace that evaluation, and the importance of remaining in the ED until their evaluation is complete.     Carlisle Cater, PA-C 08/26/21 1611

## 2021-08-26 NOTE — Sepsis Progress Note (Signed)
Elink following code sepsis °

## 2021-08-26 NOTE — ED Provider Notes (Signed)
[facility] EMERGENCY DEPARTMENT Provider Note   CSN: [id number] Arrival date & time: 08/26/21  1511     History  Chief Complaint  Patient presents with   Generalized Body Aches   Vaginal Discharge   Vaginal Itching    Amy Tran is a 66 y.o. female.  66 year old female presents today for evaluation of fever, chills, dysuria, vaginal discharge, and vaginal irritation ongoing for about a week.  States she got an epidural injection in her back about 2 weeks ago.  Denies any change in her back pain.  Also takes pain medication as well as muscle relaxer.  Fever and chills started today.  Temp on arrival was 102.8.  Reports chronic sinus congestion with postnasal drainage.  Denies abdominal pain, flank pain.  She is not sexually active.  The history is provided by the patient. No language interpreter was used.       Home Medications Prior to Admission medications   Medication Sig Start Date End Date Taking? Authorizing Provider  acetaminophen (TYLENOL) 500 MG tablet Take 500 mg by mouth every 6 (six) hours as needed for mild pain or headache.    [provider]  albuterol (VENTOLIN HFA) 108 (90 Base) MCG/ACT inhaler Inhale 2 puffs into the lungs every 6 (six) hours as needed for wheezing or shortness of breath.    [provider]  atorvastatin (LIPITOR) 80 MG tablet TAKE 1 TABLET BY MOUTH DAILY AT 6 PM. 02/13/21   Branch, Alphonse Guild, MD  BD PEN NEEDLE NANO U/F 32G X 4 MM MISC USE FOR [place] INJECTIONS 08/03/18   [provider]  cetirizine (ZYRTEC) 10 MG tablet Take 10 mg by mouth at bedtime.    [provider]  clopidogrel (PLAVIX) 75 MG tablet TAKE 1 TABLET (75 MG TOTAL) BY MOUTH DAILY WITH BREAKFAST. 06/12/20   Imogene Burn, PA-C  dofetilide (TIKOSYN) 250 MCG capsule TAKE 1 CAPSULE BY MOUTH TWICE A DAY 05/14/21   Allred, Jeneen Rinks, MD  fluticasone (FLONASE) 50 MCG/ACT nasal spray Place 2 sprays into both nostrils daily as needed  for rhinitis (congestion/ dryness). 06/29/18   [provider]  JARDIANCE 10 MG TABS tablet Take 10 mg by mouth daily. 02/11/21   [provider]  losartan (COZAAR) 100 MG tablet Take 100 mg by mouth daily with breakfast.     [provider]  nitroGLYCERIN (NITROSTAT) 0.4 MG SL tablet Place 1 tablet (0.4 mg total) under the tongue every 5 (five) minutes as needed for chest pain. 04/28/19   Florencia Reasons, MD  potassium chloride SA (KLOR-CON) 20 MEQ tablet Take 1 tablet (20 mEq total) by mouth 2 (two) times daily. 09/23/19   Shirley Friar, PA-C  spironolactone (ALDACTONE) 25 MG tablet Take 25 mg by mouth daily with breakfast.     [provider]  torsemide (DEMADEX) 20 MG tablet Take 60 mg by mouth daily after breakfast.     [provider]  VICTOZA 18 MG/3ML SOPN Inject 1.8 mg into the skin at bedtime.  08/04/18   [provider]  VITAMIN D PO Take 2 capsules by mouth daily.    [provider]  XARELTO 20 MG TABS tablet TAKE 1 TABLET BY MOUTH EVERYDAY AT BEDTIME 06/12/21   Waynetta Sandy, MD      Allergies    Penicillins and Doxycycline    Review of Systems   Review of Systems  Constitutional:  Positive for chills and fever.  Respiratory:  Negative for shortness of breath.   Cardiovascular:  Negative for chest pain.  Gastrointestinal:  Negative for abdominal pain and nausea.  Genitourinary:  Positive for dysuria, pelvic pain and vaginal discharge. Negative for flank pain.  Neurological:  Negative for light-headedness.  All other systems reviewed and are negative.   Physical Exam Updated Vital Signs BP 129/79   Pulse 86   Temp (!) 100.7 F (38.2 C)   Resp 18   Ht '5\' 4"'$  (1.626 m)   Wt 125.2 kg   SpO2 93%   BMI 47.38 kg/m  Physical Exam  ED Results / Procedures / Treatments   Labs (all labs ordered are listed, but only abnormal results are displayed) Labs Reviewed  LACTIC ACID, PLASMA - Abnormal; Notable  for the following components:      Result Value   Lactic Acid, Venous 2.5 (*)    All other components within normal limits  COMPREHENSIVE METABOLIC PANEL - Abnormal; Notable for the following components:   Potassium 3.4 (*)    CO2 20 (*)    Glucose, Bld 182 (*)    Total Bilirubin 1.8 (*)    All other components within normal limits  CBC WITH DIFFERENTIAL/PLATELET - Abnormal; Notable for the following components:   WBC 16.8 (*)    Neutro Abs 16.1 (*)    Lymphs Abs 0.3 (*)    Abs Immature Granulocytes 0.10 (*)    All other components within normal limits  PROTIME-INR - Abnormal; Notable for the following components:   Prothrombin Time 17.5 (*)    INR 1.5 (*)    All other components within normal limits  CULTURE, BLOOD (ROUTINE X 2)  CULTURE, BLOOD (ROUTINE X 2)  URINE CULTURE  SARS CORONAVIRUS 2 BY RT PCR  APTT  LACTIC ACID, PLASMA  URINALYSIS, ROUTINE W REFLEX MICROSCOPIC    EKG None  Radiology DG Chest Port 1 View  Result Date: 08/26/2021 CLINICAL DATA:  Questionable sepsis - evaluate for abnormality EXAM: PORTABLE CHEST 1 VIEW COMPARISON:  03/06/2021, CT 01/19/2016 FINDINGS: Upper normal heart size, stable. Mild right greater than left hilar prominence is likely due to prominence of the pulmonary arteries when compared with prior CT. Difficult to exclude adenopathy particularly on the right. The lungs are clear. Pulmonary vasculature is normal. No consolidation, pleural effusion, or pneumothorax. No acute osseous abnormalities are seen. IMPRESSION: 1. Mild right greater than left hilar prominence is likely due to prominence of the pulmonary arteries when compared with prior CT. Difficult to exclude adenopathy particularly on the right. 2. No acute pulmonary process. Electronically Signed   By: Keith Rake M.D.   On: 08/26/2021 17:10    Procedures Procedures    Medications Ordered in ED Medications  acetaminophen (TYLENOL) tablet 650 mg (has no administration in time  range)  lactated ringers infusion (has no administration in time range)  lactated ringers bolus 1,000 mL (has no administration in time range)    And  lactated ringers bolus 800 mL (has no administration in time range)  aztreonam (AZACTAM) 2 g in sodium chloride 0.9 % 100 mL IVPB (has no administration in time range)  metroNIDAZOLE (FLAGYL) IVPB 500 mg (has no administration in time range)  vancomycin (VANCOCIN) IVPB 1000 mg/200 mL premix (has no administration in time range)    ED Course/ Medical Decision Making/ A&P                           Medical Decision  Making Amount and/or Complexity of Data Reviewed Labs: ordered. Radiology: ordered.  Risk Prescription drug management. Decision regarding hospitalization.   Medical Decision Making / ED Course   This patient presents to the ED for concern of dysuria, vaginal discharge, vaginal irritation, fever, this involves an extensive number of treatment options, and is a complaint that carries with it a high risk of complications and morbidity.  The differential diagnosis includes UTI, STI, sepsis, pneumonia  MDM: 66 year old female presents today.  On arrival patient was SIRS positive.  Work-up revealed patient had leukocytosis of 16,000 with left shift, lactic acidosis CMP with hyperbilirubinemia without elevated LFTs, preserved renal function, elevated glucose of 182.  UA shows some signs of UTI with nitrite and leukocyte positive.  Urine culture pending.  Patient is on broad-spectrum antibiotics.  She is without abdominal tenderness.  Reports chronic cough, and sinus congestion with postnasal drainage.  Chest x-ray without evidence of pneumonia.  COVID-negative.  EKG without acute ischemic changes.  Large volume resuscitation ordered.  Will discuss with hospitalist for admission.  Lactic acidosis improved to 2.0 following fluids.  Discussed with hospitalist will evaluate patient for admission.  Ordered CT abdomen pelvis, repeat UA with  and out catheterization, and Pro-Cal.  Patient is okay with catheterization for repeat UA to ensure we get an accurate sample.    Lab Tests: -I ordered, reviewed, and interpreted labs.   The pertinent results include:   Labs Reviewed  LACTIC ACID, PLASMA - Abnormal; Notable for the following components:      Result Value   Lactic Acid, Venous 2.5 (*)    All other components within normal limits  LACTIC ACID, PLASMA - Abnormal; Notable for the following components:   Lactic Acid, Venous 2.0 (*)    All other components within normal limits  COMPREHENSIVE METABOLIC PANEL - Abnormal; Notable for the following components:   Potassium 3.4 (*)    CO2 20 (*)    Glucose, Bld 182 (*)    Total Bilirubin 1.8 (*)    All other components within normal limits  CBC WITH DIFFERENTIAL/PLATELET - Abnormal; Notable for the following components:   WBC 16.8 (*)    Neutro Abs 16.1 (*)    Lymphs Abs 0.3 (*)    Abs Immature Granulocytes 0.10 (*)    All other components within normal limits  PROTIME-INR - Abnormal; Notable for the following components:   Prothrombin Time 17.5 (*)    INR 1.5 (*)    All other components within normal limits  URINALYSIS, ROUTINE W REFLEX MICROSCOPIC - Abnormal; Notable for the following components:   APPearance HAZY (*)    Glucose, UA 150 (*)    Nitrite POSITIVE (*)    Leukocytes,Ua SMALL (*)    Bacteria, UA MANY (*)    All other components within normal limits  SARS CORONAVIRUS 2 BY RT PCR  CULTURE, BLOOD (ROUTINE X 2)  CULTURE, BLOOD (ROUTINE X 2)  URINE CULTURE  APTT      EKG  EKG Interpretation  Date/Time:  Sunday August 26 2021 19:46:33 EDT Ventricular Rate:  90 PR Interval:    QRS Duration: 82 QT Interval:  384 QTC Calculation: 470 R Axis:   63 Text Interpretation: Atrial fibrillation Low voltage, precordial leads when compared to prior, now afib. No STEMI Confirmed by Antony Blackbird [phone]) on 08/26/2021 9:11:43 PM         Imaging Studies  ordered: I ordered imaging studies including CXR I independently visualized and interpreted imaging. I  agree with the radiologist interpretation   Medicines ordered and prescription drug management: Meds ordered this encounter  Medications   acetaminophen (TYLENOL) tablet 650 mg   lactated ringers infusion   AND Linked Order Group    lactated ringers bolus 1,000 mL     Order Specific Question:   BMI >/= 35, Ideal Body Weight basis in kg for 30 mL/kg bolus delivery     Answer:   54.7    lactated ringers bolus 800 mL     Order Specific Question:   BMI >/= 35, Ideal Body Weight basis in kg for 30 mL/kg bolus delivery     Answer:   54.7   aztreonam (AZACTAM) 2 g in sodium chloride 0.9 % 100 mL IVPB    Order Specific Question:   Antibiotic Indication:    Answer:   Other Indication (list below)    Order Specific Question:   Other Indication:    Answer:   Unknown source   metroNIDAZOLE (FLAGYL) IVPB 500 mg    Order Specific Question:   Antibiotic Indication:    Answer:   Other Indication (list below)    Order Specific Question:   Other Indication:    Answer:   Unknown source   DISCONTD: vancomycin (VANCOCIN) IVPB 1000 mg/200 mL premix    Order Specific Question:   Indication:    Answer:   Other Indication (list below)    Order Specific Question:   Other Indication:    Answer:   Unknown source   vancomycin (VANCOCIN) 2,250 mg in sodium chloride 0.9 % 500 mL IVPB    Order Specific Question:   Indication:    Answer:   Other Indication (list below)    Order Specific Question:   Other Indication:    Answer:   Unknown source   aztreonam (AZACTAM) 2 g in sodium chloride 0.9 % 100 mL IVPB    Order Specific Question:   Antibiotic Indication:    Answer:   Sepsis   vancomycin (VANCOREADY) IVPB 1750 mg/350 mL    Order Specific Question:   Indication:    Answer:   Sepsis   fluconazole (DIFLUCAN) tablet 150 mg    -I have reviewed the patients home medicines and have made adjustments as  needed  Cardiac Monitoring: The patient was maintained on a cardiac monitor.  I personally viewed and interpreted the cardiac monitored which showed an underlying rhythm of: NSR  Reevaluation: After the interventions noted above, I reevaluated the patient and found that they have :improved  Co morbidities that complicate the patient evaluation  Past Medical History:  Diagnosis Date   Arthritis    Asthma    Cancer ([place])    multiple skin cancers   Diabetes mellitus without complication ([place])    Diverticulosis    Fluid retention    GERD (gastroesophageal reflux disease)    High cholesterol    Hypertension    Incomplete rotator cuff tear    Myocardial infarction ([place])    Obesity    Persistent atrial fibrillation ([facility])    PONV (postoperative nausea and vomiting)    Wears glasses    Wears partial dentures    bottom      Dispostion: Discussed with hospitalist who will evaluate patient for admission.   Final Clinical Impression(s) / ED Diagnoses Final diagnoses:  Acute cystitis without hematuria  Sepsis, due to unspecified organism, unspecified whether acute organ dysfunction present ([place])  Yeast vaginitis    Rx / DC Orders ED Discharge  Orders     None         Evlyn Courier, PA-C 08/26/21 2234    Tegeler, Gwenyth Allegra, MD 08/26/21 2238

## 2021-08-27 ENCOUNTER — Encounter (HOSPITAL_COMMUNITY): Payer: Self-pay | Admitting: Internal Medicine

## 2021-08-27 DIAGNOSIS — B9620 Unspecified Escherichia coli [E. coli] as the cause of diseases classified elsewhere: Secondary | ICD-10-CM | POA: Diagnosis present

## 2021-08-27 DIAGNOSIS — J45909 Unspecified asthma, uncomplicated: Secondary | ICD-10-CM | POA: Diagnosis present

## 2021-08-27 DIAGNOSIS — I4819 Other persistent atrial fibrillation: Secondary | ICD-10-CM

## 2021-08-27 DIAGNOSIS — R6510 Systemic inflammatory response syndrome (SIRS) of non-infectious origin without acute organ dysfunction: Secondary | ICD-10-CM | POA: Diagnosis present

## 2021-08-27 DIAGNOSIS — A419 Sepsis, unspecified organism: Secondary | ICD-10-CM

## 2021-08-27 DIAGNOSIS — K219 Gastro-esophageal reflux disease without esophagitis: Secondary | ICD-10-CM

## 2021-08-27 DIAGNOSIS — E1165 Type 2 diabetes mellitus with hyperglycemia: Secondary | ICD-10-CM

## 2021-08-27 DIAGNOSIS — N390 Urinary tract infection, site not specified: Secondary | ICD-10-CM | POA: Diagnosis not present

## 2021-08-27 DIAGNOSIS — E782 Mixed hyperlipidemia: Secondary | ICD-10-CM | POA: Diagnosis not present

## 2021-08-27 DIAGNOSIS — E669 Obesity, unspecified: Secondary | ICD-10-CM | POA: Diagnosis present

## 2021-08-27 DIAGNOSIS — R109 Unspecified abdominal pain: Secondary | ICD-10-CM | POA: Diagnosis not present

## 2021-08-27 DIAGNOSIS — I700 Atherosclerosis of aorta: Secondary | ICD-10-CM | POA: Diagnosis present

## 2021-08-27 DIAGNOSIS — K429 Umbilical hernia without obstruction or gangrene: Secondary | ICD-10-CM | POA: Diagnosis present

## 2021-08-27 DIAGNOSIS — N3000 Acute cystitis without hematuria: Secondary | ICD-10-CM | POA: Diagnosis present

## 2021-08-27 DIAGNOSIS — B3731 Acute candidiasis of vulva and vagina: Secondary | ICD-10-CM

## 2021-08-27 DIAGNOSIS — Z6841 Body Mass Index (BMI) 40.0 and over, adult: Secondary | ICD-10-CM | POA: Diagnosis not present

## 2021-08-27 DIAGNOSIS — M1990 Unspecified osteoarthritis, unspecified site: Secondary | ICD-10-CM | POA: Diagnosis present

## 2021-08-27 DIAGNOSIS — E872 Acidosis, unspecified: Secondary | ICD-10-CM | POA: Diagnosis not present

## 2021-08-27 DIAGNOSIS — E278 Other specified disorders of adrenal gland: Secondary | ICD-10-CM | POA: Diagnosis present

## 2021-08-27 DIAGNOSIS — I2510 Atherosclerotic heart disease of native coronary artery without angina pectoris: Secondary | ICD-10-CM | POA: Diagnosis not present

## 2021-08-27 DIAGNOSIS — K5730 Diverticulosis of large intestine without perforation or abscess without bleeding: Secondary | ICD-10-CM | POA: Diagnosis present

## 2021-08-27 DIAGNOSIS — I10 Essential (primary) hypertension: Secondary | ICD-10-CM | POA: Diagnosis not present

## 2021-08-27 DIAGNOSIS — M47816 Spondylosis without myelopathy or radiculopathy, lumbar region: Secondary | ICD-10-CM | POA: Diagnosis present

## 2021-08-27 DIAGNOSIS — E876 Hypokalemia: Secondary | ICD-10-CM | POA: Diagnosis not present

## 2021-08-27 DIAGNOSIS — I252 Old myocardial infarction: Secondary | ICD-10-CM | POA: Diagnosis not present

## 2021-08-27 DIAGNOSIS — E1169 Type 2 diabetes mellitus with other specified complication: Secondary | ICD-10-CM | POA: Diagnosis not present

## 2021-08-27 DIAGNOSIS — K760 Fatty (change of) liver, not elsewhere classified: Secondary | ICD-10-CM | POA: Diagnosis present

## 2021-08-27 DIAGNOSIS — Z20822 Contact with and (suspected) exposure to covid-19: Secondary | ICD-10-CM | POA: Diagnosis present

## 2021-08-27 LAB — CBG MONITORING, ED
Glucose-Capillary: 168 mg/dL — ABNORMAL HIGH (ref 70–99)
Glucose-Capillary: 134 mg/dL — ABNORMAL HIGH (ref 70–99)
Glucose-Capillary: 150 mg/dL — ABNORMAL HIGH (ref 70–99)
Glucose-Capillary: 171 mg/dL — ABNORMAL HIGH (ref 70–99)

## 2021-08-27 LAB — COMPREHENSIVE METABOLIC PANEL
ALT: 16 U/L (ref 0–44)
AST: 20 U/L (ref 15–41)
Albumin: 3 g/dL — ABNORMAL LOW (ref 3.5–5.0)
Alkaline Phosphatase: 54 U/L (ref 38–126)
Anion gap: 5 (ref 5–15)
BUN: 8 mg/dL (ref 8–23)
CO2: 24 mmol/L (ref 22–32)
Calcium: 9 mg/dL (ref 8.9–10.3)
Chloride: 108 mmol/L (ref 98–111)
Creatinine, Ser: 0.74 mg/dL (ref 0.44–1.00)
GFR, Estimated: >60 mL/min (ref >60)
Glucose, Bld: 145 mg/dL — ABNORMAL HIGH (ref 70–99)
Potassium: 3.4 mmol/L — ABNORMAL LOW (ref 3.5–5.1)
Sodium: 137 mmol/L (ref 135–145)
Total Bilirubin: 1.5 mg/dL — ABNORMAL HIGH (ref 0.3–1.2)
Total Protein: 6 g/dL — ABNORMAL LOW (ref 6.5–8.1)

## 2021-08-27 LAB — WET PREP, GENITAL
Clue Cells Wet Prep HPF POC: NONE SEEN
Sperm: NONE SEEN
Trich, Wet Prep: NONE SEEN
WBC, Wet Prep HPF POC: <10 (ref <10)
Yeast Wet Prep HPF POC: NONE SEEN

## 2021-08-27 LAB — CBC WITH DIFFERENTIAL/PLATELET
Abs Immature Granulocytes: 0.25 10*3/uL — ABNORMAL HIGH (ref 0.00–0.07)
Basophils Absolute: 0.1 10*3/uL (ref 0.0–0.1)
Basophils Relative: 0 %
Eosinophils Absolute: 0 10*3/uL (ref 0.0–0.5)
Eosinophils Relative: 0 %
HCT: 40.9 % (ref 36.0–46.0)
Hemoglobin: 13.4 g/dL (ref 12.0–15.0)
Immature Granulocytes: 1 %
Lymphocytes Relative: 4 %
Lymphs Abs: 1.1 10*3/uL (ref 0.7–4.0)
MCH: 30.6 pg (ref 26.0–34.0)
MCHC: 32.8 g/dL (ref 30.0–36.0)
MCV: 93.4 fL (ref 80.0–100.0)
Monocytes Absolute: 1.5 10*3/uL — ABNORMAL HIGH (ref 0.1–1.0)
Monocytes Relative: 6 %
Neutro Abs: 22.7 10*3/uL — ABNORMAL HIGH (ref 1.7–7.7)
Neutrophils Relative %: 89 %
Platelets: 212 10*3/uL (ref 150–400)
RBC: 4.38 MIL/uL (ref 3.87–5.11)
RDW: 14 % (ref 11.5–15.5)
WBC: 25.6 10*3/uL — ABNORMAL HIGH (ref 4.0–10.5)
nRBC: 0 % (ref 0.0–0.2)

## 2021-08-27 LAB — HEMOGLOBIN A1C
Hgb A1c MFr Bld: 7.8 % — ABNORMAL HIGH (ref 4.8–5.6)
Mean Plasma Glucose: 177.16 mg/dL

## 2021-08-27 LAB — MAGNESIUM: Magnesium: 1.8 mg/dL (ref 1.7–2.4)

## 2021-08-27 LAB — HIV ANTIBODY (ROUTINE TESTING W REFLEX): HIV Screen 4th Generation wRfx: NONREACTIVE

## 2021-08-27 LAB — LACTIC ACID, PLASMA: Lactic Acid, Venous: 2.1 mmol/L (ref 0.5–1.9)

## 2021-08-27 LAB — GLUCOSE, CAPILLARY: Glucose-Capillary: 142 mg/dL — ABNORMAL HIGH (ref 70–99)

## 2021-08-27 MED ORDER — CLOPIDOGREL BISULFATE 75 MG PO TABS
75.0000 mg | ORAL_TABLET | Freq: Every day | ORAL | Status: DC
  Administered 2021-08-27 – 2021-08-29 (×3): 75 mg via ORAL
  Filled 2021-08-27 (×3): qty 1

## 2021-08-27 MED ORDER — IOHEXOL 300 MG/ML  SOLN
100.0000 mL | Freq: Once | INTRAMUSCULAR | Status: AC | PRN
  Administered 2021-08-27: 100 mL via INTRAVENOUS

## 2021-08-27 MED ORDER — SODIUM CHLORIDE 0.9% FLUSH
3.0000 mL | Freq: Two times a day (BID) | INTRAVENOUS | Status: DC
Start: 2021-08-27 — End: 2021-08-29
  Administered 2021-08-27 – 2021-08-28 (×3): 3 mL via INTRAVENOUS

## 2021-08-27 MED ORDER — SODIUM CHLORIDE 0.9 % IV SOLN
1.0000 g | INTRAVENOUS | Status: DC
  Administered 2021-08-27: 1 g via INTRAVENOUS
  Filled 2021-08-27: qty 10

## 2021-08-27 MED ORDER — ALBUTEROL SULFATE (2.5 MG/3ML) 0.083% IN NEBU: 2.5000 mg | INHALATION_SOLUTION | Freq: Four times a day (QID) | RESPIRATORY_TRACT | Status: DC | PRN

## 2021-08-27 MED ORDER — NITROGLYCERIN 0.4 MG SL SUBL: 0.4000 mg | SUBLINGUAL_TABLET | SUBLINGUAL | Status: DC | PRN

## 2021-08-27 MED ORDER — LACTATED RINGERS IV SOLN: INTRAVENOUS | Status: DC

## 2021-08-27 MED ORDER — ONDANSETRON HCL 4 MG/2ML IJ SOLN: 4.0000 mg | Freq: Four times a day (QID) | INTRAMUSCULAR | Status: DC | PRN

## 2021-08-27 MED ORDER — ATORVASTATIN CALCIUM 80 MG PO TABS
80.0000 mg | ORAL_TABLET | Freq: Every day | ORAL | Status: DC
  Administered 2021-08-27 – 2021-08-28 (×2): 80 mg via ORAL
  Filled 2021-08-27 (×2): qty 1

## 2021-08-27 MED ORDER — SODIUM CHLORIDE 0.9 % IV SOLN
2.0000 g | Freq: Three times a day (TID) | INTRAVENOUS | Status: DC
  Administered 2021-08-27: 2 g via INTRAVENOUS
  Filled 2021-08-27: qty 12.5

## 2021-08-27 MED ORDER — MAGNESIUM SULFATE 2 GM/50ML IV SOLN
2.0000 g | Freq: Once | INTRAVENOUS | Status: AC
Start: 2021-08-27 — End: 2021-08-27
  Administered 2021-08-27: 2 g via INTRAVENOUS
  Filled 2021-08-27: qty 50

## 2021-08-27 MED ORDER — RIVAROXABAN 20 MG PO TABS
20.0000 mg | ORAL_TABLET | Freq: Every day | ORAL | Status: DC
  Administered 2021-08-27 – 2021-08-28 (×2): 20 mg via ORAL
  Filled 2021-08-27: qty 1
  Filled 2021-08-27: qty 2

## 2021-08-27 MED ORDER — POTASSIUM CHLORIDE CRYS ER 20 MEQ PO TBCR
40.0000 meq | EXTENDED_RELEASE_TABLET | Freq: Once | ORAL | Status: AC
Start: 2021-08-27 — End: 2021-08-27
  Administered 2021-08-27: 40 meq via ORAL
  Filled 2021-08-27: qty 2

## 2021-08-27 MED ORDER — PANTOPRAZOLE SODIUM 40 MG PO TBEC
40.0000 mg | DELAYED_RELEASE_TABLET | Freq: Every day | ORAL | Status: DC
  Administered 2021-08-27 – 2021-08-29 (×3): 40 mg via ORAL
  Filled 2021-08-27 (×3): qty 1

## 2021-08-27 MED ORDER — POLYETHYLENE GLYCOL 3350 17 G PO PACK: 17.0000 g | PACK | Freq: Every day | ORAL | Status: DC | PRN

## 2021-08-27 MED ORDER — METRONIDAZOLE 500 MG PO TABS
500.0000 mg | ORAL_TABLET | Freq: Two times a day (BID) | ORAL | Status: DC
  Administered 2021-08-27 (×2): 500 mg via ORAL
  Filled 2021-08-27 (×2): qty 1

## 2021-08-27 MED ORDER — INSULIN ASPART 100 UNIT/ML IJ SOLN
0.0000 [IU] | Freq: Three times a day (TID) | INTRAMUSCULAR | Status: DC
  Administered 2021-08-27 (×2): 2 [IU] via SUBCUTANEOUS
  Administered 2021-08-27 (×2): 3 [IU] via SUBCUTANEOUS
  Administered 2021-08-28: 2 [IU] via SUBCUTANEOUS
  Administered 2021-08-28: 3 [IU] via SUBCUTANEOUS
  Administered 2021-08-28 (×2): 2 [IU] via SUBCUTANEOUS
  Administered 2021-08-29 (×2): 3 [IU] via SUBCUTANEOUS

## 2021-08-27 MED ORDER — DOFETILIDE 250 MCG PO CAPS
250.0000 ug | ORAL_CAPSULE | Freq: Two times a day (BID) | ORAL | Status: DC
  Administered 2021-08-27 – 2021-08-29 (×5): 250 ug via ORAL
  Filled 2021-08-27 (×8): qty 1

## 2021-08-27 MED ORDER — LORATADINE 10 MG PO TABS
10.0000 mg | ORAL_TABLET | Freq: Every day | ORAL | Status: DC
  Administered 2021-08-27 – 2021-08-28 (×2): 10 mg via ORAL
  Filled 2021-08-27 (×2): qty 1

## 2021-08-27 MED ORDER — HYDRALAZINE HCL 20 MG/ML IJ SOLN: 10.0000 mg | Freq: Four times a day (QID) | INTRAMUSCULAR | Status: DC | PRN

## 2021-08-27 MED ORDER — FLUCONAZOLE 150 MG PO TABS: 150.0000 mg | ORAL_TABLET | Freq: Once | ORAL | Status: DC

## 2021-08-27 MED ORDER — SPIRONOLACTONE 25 MG PO TABS
25.0000 mg | ORAL_TABLET | Freq: Every day | ORAL | Status: DC
  Administered 2021-08-27: 25 mg via ORAL
  Filled 2021-08-27: qty 1

## 2021-08-27 MED ORDER — LOSARTAN POTASSIUM 50 MG PO TABS
100.0000 mg | ORAL_TABLET | Freq: Every day | ORAL | Status: DC
  Administered 2021-08-27 – 2021-08-28 (×2): 100 mg via ORAL
  Filled 2021-08-27 (×3): qty 2

## 2021-08-27 MED ORDER — ACETAMINOPHEN 650 MG RE SUPP: 650.0000 mg | Freq: Four times a day (QID) | RECTAL | Status: DC | PRN

## 2021-08-27 MED ORDER — AZITHROMYCIN 250 MG PO TABS
500.0000 mg | ORAL_TABLET | Freq: Once | ORAL | Status: AC
  Administered 2021-08-27: 500 mg via ORAL
  Filled 2021-08-27: qty 2

## 2021-08-27 MED ORDER — SODIUM CHLORIDE 0.9 % IV SOLN
2.0000 g | INTRAVENOUS | Status: DC
  Administered 2021-08-27: 2 g via INTRAVENOUS
  Filled 2021-08-27: qty 20

## 2021-08-27 MED ORDER — ACETAMINOPHEN 325 MG PO TABS: 650.0000 mg | ORAL_TABLET | Freq: Four times a day (QID) | ORAL | Status: DC | PRN

## 2021-08-27 MED ORDER — ONDANSETRON HCL 4 MG PO TABS: 4.0000 mg | ORAL_TABLET | Freq: Four times a day (QID) | ORAL | Status: DC | PRN

## 2021-08-27 MED ORDER — AZITHROMYCIN 500 MG PO TABS
250.0000 mg | ORAL_TABLET | Freq: Every day | ORAL | Status: DC
Start: 2021-08-28 — End: 2021-08-28

## 2021-08-27 NOTE — Assessment & Plan Note (Signed)
•   Patient is currently chest pain free °• Monitoring patient on telemetry °• Continue home regimen of antiplatelet therapy, lipid lowering therapy and AV nodal blocking therapy ° °

## 2021-08-27 NOTE — ED Notes (Signed)
Lab tech called stating one of pts swabs need to be recollected. Lab to send test tube to ed yellow tube station.

## 2021-08-27 NOTE — Assessment & Plan Note (Signed)
.   Resume patients home regimen of oral antihypertensives . Titrate antihypertensive regimen as necessary to achieve adequate BP control . PRN intravenous antihypertensives for excessively elevated blood pressure   

## 2021-08-27 NOTE — Progress Notes (Signed)
Pharmacy Antibiotic Note  Amy Tran is a 66 y.o. female for which pharmacy has been consulted for cefepime dosing for sepsis.  Patient with a history of asthma, cancer, DM, diverticulosis, GERD, HTN, MI, obesity, AF. Patient presenting with  fever, chills, dysuria, vaginal discharge, and vaginal irritation.  SCr 0.74 - at baseline WBC 25.6 (trending up); LA 2.1 (remains elevated); Fever curve trending down with Temp 98; HR trending down to 50-60s; RR 19-22 Vancomycin, aztreonam, and metronidazole d/c'd and de-escalated to ceftriaxone overnight.  Plan: Discontinue Ceftriaxone Start Cefepime 2g IV q8h Trend WBC, Fever, Renal function, & Clinical course F/u cultures, clinical course, WBC, fever De-escalate when able  Height: '5\' 4"'$  (162.6 cm) Weight: 125.2 kg (276 lb) IBW/kg (Calculated) : 54.7  Temp (24hrs), Avg:99.8 F (37.7 C), Min:98 F (36.7 C), Max:102.8 F (39.3 C)  Recent Labs  Lab 08/26/21 1618 08/26/21 1948 08/27/21 0350 08/27/21 0601  WBC 16.8*  --  25.6*  --   CREATININE 0.80  --  0.74  --   LATICACIDVEN 2.5* 2.0*  --  2.1*     Estimated Creatinine Clearance: 90.5 mL/min (by C-G formula based on SCr of 0.74 mg/dL).    Allergies  Allergen Reactions   Penicillins Hives and Rash    Has patient had a PCN reaction causing immediate rash, facial/tongue/throat swelling, SOB or lightheadedness with hypotension: Yes Has patient had a PCN reaction causing severe rash involving mucus membranes or skin necrosis: No Has patient had a PCN reaction that required hospitalization No Has patient had a PCN reaction occurring within the last 10 years: No If all of the above answers are "NO", then may proceed with Cephalosporin use.    Doxycycline Itching   Antimicrobials this admission: aztreonam 7/16 x1 metronidazole 7/16 x1  vancomycin 7/16 x1 ceftriaxone 7/17 x1 cefepime 7/17 >>   Microbiology results: Pending  Thank you for allowing pharmacy to be a part of  this patient's care.  Luisa Hart, PharmD, BCPS Clinical Pharmacist 08/27/2021 9:20 AM   Please refer to AMION for pharmacy phone number

## 2021-08-27 NOTE — Assessment & Plan Note (Signed)
.   Patient presenting with multiple SIRS criteria and possible sepsis in the setting of suspected urinary tract infection with concurrent lactic acidosis  . Aggressive intravenous volume resuscitation . Intravenous ceftriaxone initiated . Blood cultures and urine cultures obtained, antibiotic therapy will be deescalated based on these results . Obtaining serial lactic acid levels, procalcitonin . Close clinical monitoring as patient is at high risk of rapid clinical decompensation

## 2021-08-27 NOTE — ED Notes (Signed)
Allowed pt self swab for pending wet prep and GC/C

## 2021-08-27 NOTE — Assessment & Plan Note (Signed)
Continuing home regimen of daily PPI therapy.  

## 2021-08-27 NOTE — ED Notes (Signed)
Pt saline locked for transport. IVF fluids complete. Floor RN to restart.

## 2021-08-27 NOTE — Assessment & Plan Note (Signed)
   Ongoing cottage cheeselike vaginal discharge for several weeks   This began after a recent course of antibiotics several weeks ago after a skin abscess was drained in the left axillary/left flank region on 4/25

## 2021-08-27 NOTE — Assessment & Plan Note (Signed)
   Lactic acidosis likely secondary to sepsis with concurrent volume depletion  Hydrate patient intravenous isotonic fluids while concurrently treating underlying infection  Monitoring serial lactic acid levels to ensure downtrending and resolution.

## 2021-08-27 NOTE — Progress Notes (Signed)
Progress Note   Patient: Amy Tran QQV:[id number] DOB: 29-Aug-1955 DOA: 08/26/2021     0 DOS: the patient was seen and examined on 08/27/2021   Brief hospital course: 66 year old female with PMH of HTN, HL, DM2, CAD s/p 2021 stent, PAF (on Tikosyn and Xarelto), chronic iron deficiency anemia who presented to the ED on 7/16 with fevers up to 102.8, chills, WBC 16K, and lactic acidosis 2.5 mmol/L, groin pain, and vaginal discharge.  CT of Abdomen/Pelvis shows no acute abnormality.    Assessment and Plan: Low Grade Fevers and Leukocytosis secondary to Acute Vaginitis: WBC count is up to 25.6K.  Blood Cultures are NG x 24 hours.  Urine Culture is pending.  HIV test was negative.  I reviewed CT imaging and I do not see any pelvic abscess or bladder infection, so given vaginal discharge this is more likely an acute vaginitis.   - Check vaginal studies - bacterial, candida, trichomonas, and HSV.  - Broaden antibiotics to Ceftriaxone and Azithromycin and treat for 7 days (patient has allergy to Doxycycline)- add on Flagyl 500 mg BID to cover trichomonas and add on Fluconazole 150 mg x once to cover candidiasis. - Continue fluids x 24 hours.  Lactic Acidosis: Lactate is 2.1 mmol/L. - Continue LR at 150 CC/hr (weight is 125 kg).  Hold diuretics at this time.  Hypokalemia: - K 3.4 mmol/L, replaced.  Essential Hypertension: BP is stable. - Continue home Losartan.  Renal function is stable.  Paroxysmal Atrial Fibrillation: - Continue Tikosyn to maintain sinus rhythm.  Keep K >4 and Mg >2.   - Continue Xarelto for anticoagulation.  CAD s/p 2021 stent: - Plavix and Statin.  Diabetes Mellitus Type 2: A1C is 7.8%.  Glucose is stable. - Continue Lispro Ssi with POC glucose ACHS.  Hyperlipidemia: - Statin.  GERD:  - PPI  Gastric Bypass Fatty Liver disease 3 cm left adrenal nodule: Follow up with Endocrine outpatient. Aortic Atherosclerosis Colon Diverticulosis Lumbar  Spondylosis   Subjective: Ms. Bozich is lying in bed comfortably. She states she is feeling better since yesterday but continues to endorse groin pain and discharge. She had a fever last night of 100.7. Heart rate is running 59-64 beats/min. BP is stable ~ 111/61 mmHg.   WBC is up to 25.6K with 89% neutrophils.  Physical Exam: Vitals:   08/27/21 1245 08/27/21 1300 08/27/21 1315 08/27/21 1324  BP: 101/68 (!) 145/78 125/73   Pulse: (!) 59 71 (!) 40   Resp: '16 19 20   '$ Temp:    98.2 F (36.8 C)  TempSrc:    Oral  SpO2: 93% 94% 95%   Weight:      Height:       Examination: General exam: chronically ill appearing, obese, fatigue, malaise HEENT: NCAT, PERRL Respiratory system: CTAB no WRR Cardiovascular system: Did not appreciate a murmur, regular rate and rhythm, No JVD. Gastrointestinal system: endorses groin pain, the rest of abdomen ies nontender and nondistended.  BS+. Nervous System: No focal deficits. Extremities: No edema, distal peripheral pulses palpable.  Skin: No rashes, No bruises, No icterus. Mood: Stable.  Data Reviewed: CT ABDOMEN PELVIS W CONTRAST CLINICAL DATA:  Abdominal pain.  EXAM: CT ABDOMEN AND PELVIS WITH CONTRAST  TECHNIQUE: Multidetector CT imaging of the abdomen and pelvis was performed using the standard protocol following bolus administration of intravenous contrast.  RADIATION DOSE REDUCTION: This exam was performed according to the departmental dose-optimization program which includes automated exposure control, adjustment of the mA  and/or kV according to patient size and/or use of iterative reconstruction technique.  CONTRAST:  156m OMNIPAQUE IOHEXOL 300 MG/ML  SOLN  COMPARISON:  CT abdomen pelvis dated 10/16/2016.  FINDINGS: Lower chest: The visualized lung bases are clear. There is coronary vascular calcification.  No intra-abdominal free air or free fluid.  Hepatobiliary: Fatty liver. No intrahepatic biliary  dilatation. Cholecystectomy. No retained calcified stone noted in the central CBD.  Pancreas: Unremarkable. No pancreatic ductal dilatation or surrounding inflammatory changes.  Spleen: No acute findings.  Adrenals/Urinary Tract: The right adrenal gland is unremarkable. Indeterminate 3 cm left adrenal nodule present on the study of 2018, likely an adenoma. There is no hydronephrosis on either side. There is symmetric enhancement and excretion of contrast by both kidneys. The visualized ureters and the bladder appear unremarkable.  Stomach/Bowel: Small scattered sigmoid diverticula without active inflammatory changes. There is postsurgical changes of gastric bypass. Loose stool noted throughout the colon. There is no bowel obstruction or active inflammation. The appendix is normal.  Vascular/Lymphatic: Mild aortoiliac atherosclerotic disease. The IVC is unremarkable. Left common iliac vein stent. Evaluation of the stent is limited on this arterial phase CT. No portal venous gas. There is no adenopathy.  Reproductive: The uterus is anteverted and grossly unremarkable. No adnexal masses.  Other: Small fat containing umbilical hernia.  Musculoskeletal: Lower lumbar disc desiccation and vacuum phenomena. No acute osseous pathology.  IMPRESSION: 1. No acute intra-abdominal or pelvic pathology. 2. Colonic diverticulosis. No bowel obstruction. Normal appendix. 3. Fatty liver. 4. Indeterminate 3 cm left adrenal nodule present on the study of 2018, likely an adenoma. 5. Aortic Atherosclerosis (ICD10-I70.0).  Electronically Signed   By: AAnner CreteM.D.   On: 08/27/2021 00:25    Family Communication: I spoke with patient only.  Disposition: Status is: Inpatient Remains inpatient appropriate because: need IV antibiotics and IV fluids, cultures are pending.  Planned Discharge Destination: Home DVT prophylaxis: None. On Xarelto.  Time spent: >30   minutes  Author: HGeorge Hugh MD 08/27/2021 2:42 PM  For on call review [email or web address]

## 2021-08-27 NOTE — ED Notes (Signed)
Pt was ambulatory to bathroom

## 2021-08-27 NOTE — Assessment & Plan Note (Signed)
   Currently rate controlled.  Continue home regimen of anticoagulation  Continue home regimen of rate controlling agent with Tikosyn  Monitoring on telemetry

## 2021-08-27 NOTE — H&P (Signed)
History and Physical    Patient: Amy Tran MRN: [id number] DOA: 08/26/2021  Date of Service: the patient was seen and examined on 08/27/2021  Patient coming from: Home  Chief Complaint:  Chief Complaint  Patient presents with   Generalized Body Aches   Vaginal Discharge   Vaginal Itching    HPI:   66 year old female with past medical history of hypertension, nicotine dependence, coronary artery disease (NSTEMI 04/2019 with DES to RCA), hypertension, hyperlipidemia, left iliac thrombosis, non-insulin dependent diabetes mellitus type 2, persistent atrial fibrillation (S/P DCCV 05/2019, on Xarelto) and history of chronic iron deficiency anemia (receiving regular Iron infusions) who presents to [facility] emergency department with complaints of generalized body aches.  Patient explains that approximately 1 week ago she began to experience generalized malaise.  This generalized malaise was associated with intermittent chills, lower abdominal discomfort, vaginal irritation and intermittent vaginal discharge.  Patient denies new sexual partners or dysuria specifically.  Over the next several days the patient's lower abdominal discomfort persisted with increasingly worsening malaise and poor appetite.  Patient reports that earlier in the day on 7/16 she began to experience episodes of fever prompting the patient to present to [facility] emergency department for evaluation.  Upon evaluation in the emergency department patient was found to exhibit multiple SIRS criteria including fever 102.8 F, tachypnea and leukocytosis.  Urinalysis was felt to be suggestive of a urinary tract infection.  CT imaging of the abdomen pelvis revealed no acute intra-abdominal pathology.  Patient was felt to be suffering from early sepsis secondary to urinary tract infection and was placed on intravenous antibiotic therapy as well as being bolused with lactated Ringer solution.  Hospitalist group  was then called to assess the patient for admission to the hospital.  Review of Systems: Review of Systems  Constitutional:  Positive for chills, fever and malaise/fatigue.  Gastrointestinal:  Positive for abdominal pain.  Neurological:  Positive for weakness.  All other systems reviewed and are negative.    Past Medical History:  Diagnosis Date   Arthritis    Asthma    Cancer ([place])    multiple skin cancers   Diabetes mellitus without complication ([place])    Diverticulosis    Fluid retention    GERD (gastroesophageal reflux disease)    High cholesterol    Hypertension    Incomplete rotator cuff tear    Myocardial infarction ([place])    Obesity    Persistent atrial fibrillation (HCC)    PONV (postoperative nausea and vomiting)    Wears glasses    Wears partial dentures    bottom    Past Surgical History:  Procedure Laterality Date   APPENDECTOMY     CARDIOVERSION N/A 06/03/2019   Procedure: CARDIOVERSION;  Surgeon: Herminio Commons, MD;  Location: [facility];  Service: Cardiovascular;  Laterality: N/A;   CARDIOVERSION N/A 07/13/2019   Procedure: CARDIOVERSION;  Surgeon: Geralynn Rile, MD;  Location: [place];  Service: Cardiovascular;  Laterality: N/A;   CARPAL TUNNEL RELEASE Bilateral 2001   bilateral   CERVICAL POLYPECTOMY  04/08/2017   Procedure: POLYPECTOMY;  Surgeon: [name], MD;  Location: [facility];  Service: Gynecology;;  Endometrial   CHOLECYSTECTOMY     COLONOSCOPY  2007   HYI:FOYDXAJ internal hemorrhoids. Diminutive rectal polyp at 10 cm, cold biopsied/removed. The remainder of the rectal mucosa appeared normal Swallow left-sided diverticula. Diminutive polyp at the splenic flexure cold biopsied/removed (adenomatous)   COLONOSCOPY N/A 12/28/2013   Procedure:  COLONOSCOPY;  Surgeon: Daneil Dolin, MD;  Location[facility];  Service: Endoscopy;  Laterality: N/A;  730 - moved to 8:30 - Ginger notified pt   CORONARY STENT INTERVENTION N/A 04/27/2019    Procedure: CORONARY STENT INTERVENTION;  Surgeon: Troy Sine, MD;  Location: [place] [facility];  Service: Cardiovascular;  Laterality: N/A;   DILATION AND CURETTAGE, DIAGNOSTIC / THERAPEUTIC  03/07/2017   ERCP  2002   GASTRIC BYPASS  2004   HYSTEROSCOPY WITH D & C N/A 04/08/2017   Procedure: DILATATION AND CURETTAGE /HYSTEROSCOPY;  Surgeon: [name], MD;  Location: [facility];  Service: Gynecology;  Laterality: N/A;   INTRAVASCULAR ULTRASOUND/IVUS N/A 09/12/2017   Procedure: INTRAVASCULAR ULTRASOUND/IVUS;  Surgeon: Elam Dutch, MD;  Location: [place] [facility];  Service: Cardiovascular;  Laterality: N/A;   LEFT HEART CATH AND CORONARY ANGIOGRAPHY N/A 04/27/2019   Procedure: LEFT HEART CATH AND CORONARY ANGIOGRAPHY;  Surgeon: Troy Sine, MD;  Location: [place] [facility];  Service: Cardiovascular;  Laterality: N/A;   LOWER EXTREMITY ANGIOGRAPHY Left 10/02/2020   Procedure: LOWER EXTREMITY ANGIOGRAPHY;  Surgeon: Algernon Huxley, MD;  Location: [place] [facility];  Service: Cardiovascular;  Laterality: Left;   NECK SURGERY  1998   fusion   PERCUTANEOUS VENOUS THROMBECTOMY,LYSIS WITH INTRAVASCULAR ULTRASOUND (IVUS) Left 09/13/2017   Procedure: REMOVAL OF LYSIS CATHETER AND INTRAVASCULAR ULTRASOUND (IVUS) LEFT ILIAC VEIN;  Surgeon: Elam Dutch, MD;  Location: [place];  Service: Vascular;  Laterality: Left;   PERIPHERAL VASCULAR INTERVENTION  09/15/2017   Procedure: PERIPHERAL VASCULAR INTERVENTION;  Surgeon: Waynetta Sandy, MD;  Location: [place] [facility];  Service: Cardiovascular;;  VEINOUS/STENT   PERIPHERAL VASCULAR THROMBECTOMY Left 09/12/2017   Procedure: PERIPHERAL VASCULAR THROMBECTOMY;  Surgeon: Elam Dutch, MD;  Location: [place] [facility];  Service: Cardiovascular;  Laterality: Left;   PERIPHERAL VASCULAR THROMBECTOMY Left 09/15/2017   Procedure: PERIPHERAL VASCULAR THROMBECTOMY;  Surgeon: Waynetta Sandy, MD;  Location: [place] [facility];   Service: Cardiovascular;  Laterality: Left;  IVC TO LT FEM/POP VEIN   SHOULDER ARTHROSCOPY WITH SUBACROMIAL DECOMPRESSION Left 06/23/2013   Procedure: LEFT SHOULDER ARTHROSCOPY WITH DEBRIDEMENT ROTATOR CUFF AND LABRUM;  Surgeon: Lorn Junes, MD;  Location: [place];  Service: Orthopedics;  Laterality: Left;   SHOULDER SURGERY  1999   left    Social History:  reports that she has been smoking cigarettes. She started smoking about 51 years ago. She has a 8.70 pack-year smoking history. She has never used smokeless tobacco. She reports that she does not currently use alcohol. She reports that she does not use drugs.  Allergies  Allergen Reactions   Penicillins Hives and Rash    Has patient had a PCN reaction causing immediate rash, facial/tongue/throat swelling, SOB or lightheadedness with hypotension: Yes Has patient had a PCN reaction causing severe rash involving mucus membranes or skin necrosis: No Has patient had a PCN reaction that required hospitalization No Has patient had a PCN reaction occurring within the last 10 years: No If all of the above answers are "NO", then may proceed with Cephalosporin use.    Doxycycline Itching    Family History  Problem Relation Age of Onset   Diabetes Father    Hypertension Father    Parkinson's disease Father    Cancer Father        not sure what kind   Hypertension Mother    Heart disease Mother  Drug abuse Sister    Mesothelioma Brother    Cancer Daughter        non-hodgkins lmphoma   Hypertension Brother    Diabetes Brother    Colon cancer Neg Hx    Gastric cancer Neg Hx    Esophageal cancer Neg Hx     Prior to Admission medications   Medication Sig Start Date End Date Taking? Authorizing Provider  acetaminophen (TYLENOL) 500 MG tablet Take 500 mg by mouth every 6 (six) hours as needed for mild pain or headache.    [provider]  albuterol (VENTOLIN HFA) 108 (90 Base) MCG/ACT inhaler Inhale 2 puffs  into the lungs every 6 (six) hours as needed for wheezing or shortness of breath.    [provider]  atorvastatin (LIPITOR) 80 MG tablet TAKE 1 TABLET BY MOUTH DAILY AT 6 PM. 02/13/21   Branch, Alphonse Guild, MD  BD PEN NEEDLE NANO U/F 32G X 4 MM MISC USE FOR [place] INJECTIONS 08/03/18   [provider]  cetirizine (ZYRTEC) 10 MG tablet Take 10 mg by mouth at bedtime.    [provider]  clopidogrel (PLAVIX) 75 MG tablet TAKE 1 TABLET (75 MG TOTAL) BY MOUTH DAILY WITH BREAKFAST. 06/12/20   Imogene Burn, PA-C  dofetilide (TIKOSYN) 250 MCG capsule TAKE 1 CAPSULE BY MOUTH TWICE A DAY 05/14/21   Allred, Jeneen Rinks, MD  fluticasone (FLONASE) 50 MCG/ACT nasal spray Place 2 sprays into both nostrils daily as needed for rhinitis (congestion/ dryness). 06/29/18   [provider]  JARDIANCE 10 MG TABS tablet Take 10 mg by mouth daily. 02/11/21   [provider]  losartan (COZAAR) 100 MG tablet Take 100 mg by mouth daily with breakfast.     [provider]  nitroGLYCERIN (NITROSTAT) 0.4 MG SL tablet Place 1 tablet (0.4 mg total) under the tongue every 5 (five) minutes as needed for chest pain. 04/28/19   Florencia Reasons, MD  potassium chloride SA (KLOR-CON) 20 MEQ tablet Take 1 tablet (20 mEq total) by mouth 2 (two) times daily. 09/23/19   Shirley Friar, PA-C  spironolactone (ALDACTONE) 25 MG tablet Take 25 mg by mouth daily with breakfast.     [provider]  torsemide (DEMADEX) 20 MG tablet Take 60 mg by mouth daily after breakfast.     [provider]  VICTOZA 18 MG/3ML SOPN Inject 1.8 mg into the skin at bedtime.  08/04/18   [provider]  VITAMIN D PO Take 2 capsules by mouth daily.    [provider]  XARELTO 20 MG TABS tablet TAKE 1 TABLET BY MOUTH EVERYDAY AT BEDTIME 06/12/21   Waynetta Sandy, MD    Physical Exam:  Vitals:   08/27/21 0200 08/27/21 0300 08/27/21 0353 08/27/21 0400  BP: 108/65 (!) 146/77  (!)  114/57  Pulse: (!) 54 (!) 52  (!) 59  Resp: 20 20  (!) 21  Temp:   98.7 F (37.1 C)   TempSrc:   Oral   SpO2: 96% 95%  97%  Weight:      Height:        Constitutional: Awake alert and oriented x3, no associated distress.   Skin: Healing skin wound of the left flank/axillary region.  Poor skin turgor noted.   Eyes: Pupils are equally reactive to light.  No evidence of scleral icterus or conjunctival pallor.  ENMT: Dry mucous membranes noted.  Posterior pharynx clear of any exudate or lesions.   Neck:  normal, supple, no masses, no thyromegaly.  No evidence of jugular venous distension.   Respiratory: clear to auscultation bilaterally, no wheezing, no crackles. Normal respiratory effort. No accessory muscle use.  Cardiovascular: Regular rate and rhythm, no murmurs / rubs / gallops. No extremity edema. 2+ pedal pulses. No carotid bruits.  Chest:   Nontender without crepitus or deformity.   Back:   Nontender without crepitus or deformity. Abdomen: Abdomen is soft and nontender.  No evidence of intra-abdominal masses.  Positive bowel sounds noted in all quadrants.   Musculoskeletal: No joint deformity upper and lower extremities. Good ROM, no contractures. Normal muscle tone.  Neurologic: CN 2-12 grossly intact. Sensation intact.  Patient moving all 4 extremities spontaneously.  Patient is following all commands.  Patient is responsive to verbal stimuli.   Psychiatric: Patient exhibits normal mood with appropriate affect.  Patient seems to possess insight as to their current situation.    Data Reviewed:  I have personally reviewed and interpreted labs, imaging.  Significant findings are:  Procalcitonin 4.2 COVID-19 PCR testing negative.   Lactic acidosis of 2.5 Urinalysis revealing large leukocytes with 21-50 white blood cells per high-powered field CT imaging of the abdomen and pelvis revealing no acute intra-abdominal pathology  EKG: Personally reviewed.  Rhythm is atrial  fibrillation with heart rate of 90 bpm.  No dynamic ST segment changes appreciated.   Assessment and Plan: * Sepsis secondary to UTI [facility]) Patient presenting with multiple SIRS criteria and possible sepsis in the setting of suspected urinary tract infection with concurrent lactic acidosis  Aggressive intravenous volume resuscitation Intravenous ceftriaxone initiated Blood cultures and urine cultures obtained, antibiotic therapy will be deescalated based on these results Obtaining serial lactic acid levels, procalcitonin Close clinical monitoring as patient is at high risk of rapid clinical decompensation   Lactic acidosis Lactic acidosis likely secondary to sepsis with concurrent volume depletion Hydrate patient intravenous isotonic fluids while concurrently treating underlying infection Monitoring serial lactic acid levels to ensure downtrending and resolution.   Essential hypertension Resume patients home regimen of oral antihypertensives Titrate antihypertensive regimen as necessary to achieve adequate BP control PRN intravenous antihypertensives for excessively elevated blood pressure    Coronary artery disease involving native coronary artery of native heart without angina pectoris Patient is currently chest pain free Monitoring patient on telemetry Continue home regimen of antiplatelet therapy, lipid lowering therapy and AV nodal blocking therapy   Persistent atrial fibrillation (HCC) Currently rate controlled. Continue home regimen of anticoagulation Continue home regimen of rate controlling agent with Tikosyn Monitoring on telemetry   Vaginal candidiasis Ongoing cottage cheeselike vaginal discharge for several weeks  This began after a recent course of antibiotics several weeks ago after a skin abscess was drained in the left axillary/left flank region on 4/25  Type 2 diabetes mellitus with hyperglycemia, without long-term current use of insulin ([place]) Patient been  placed on Accu-Cheks before every meal and nightly with sliding scale insulin Holding home regimen of hypoglycemics Hemoglobin A1C ordered Diabetic Diet    Mixed diabetic hyperlipidemia associated with type 2 diabetes mellitus ([place]) Continuing home regimen of lipid lowering therapy.   Hypokalemia Replacing with potassium chloride Evaluating for concurrent hypomagnesemia  Monitoring potassium levels with serial chemistries.   GERD (gastroesophageal reflux disease) Continuing home regimen of daily PPI therapy.        Code Status:  Full code  code status decision has been confirmed with: patient Family Communication: deferred   Consults: None  Severity of Illness:  The  appropriate patient status for this patient is OBSERVATION. Observation status is judged to be reasonable and necessary in order to provide the required intensity of service to ensure the patient's safety. The patient's presenting symptoms, physical exam findings, and initial radiographic and laboratory data in the context of their medical condition is felt to place them at decreased risk for further clinical deterioration. Furthermore, it is anticipated that the patient will be medically stable for discharge from the hospital within 2 midnights of admission.   Author:  Vernelle Emerald MD  08/27/2021 4:53 AM

## 2021-08-27 NOTE — Assessment & Plan Note (Signed)
·   Replacing with potassium chloride °· Evaluating for concurrent hypomagnesemia  °· Monitoring potassium levels with serial chemistries. ° °

## 2021-08-27 NOTE — Assessment & Plan Note (Signed)
.   Continuing home regimen of lipid lowering therapy.  

## 2021-08-27 NOTE — ED Notes (Signed)
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 0.9 % injection 3 mL (3 mLs Intravenous Not Given 08/27/21 1017)  acetaminophen (TYLENOL) tablet 650 mg (has no administration in time range)    Or  acetaminophen (TYLENOL) suppository 650 mg (has no administration in time range)  polyethylene glycol (MIRALAX / GLYCOLAX) packet 17 g (has no administration in time range)  ondansetron (ZOFRAN) tablet 4 mg (has no administration in time range)    Or  ondansetron (ZOFRAN) injection 4 mg (has no administration in time range)   pantoprazole (PROTONIX) EC tablet 40 mg (40 mg Oral Given 08/27/21 1017)  cefTRIAXone (ROCEPHIN) 1 g in sodium chloride 0.9 % 100 mL IVPB (0 g Intravenous Stopped 08/27/21 1632)  azithromycin (ZITHROMAX) tablet 500 mg (500 mg Oral Given 08/27/21 1555)    Followed by  azithromycin (ZITHROMAX) tablet 250 mg (has no administration in time range)  metroNIDAZOLE (FLAGYL) tablet 500 mg (500 mg Oral Given 08/27/21 1555)  lactated ringers infusion ( Intravenous New Bag/Given 08/27/21 1557)  acetaminophen (TYLENOL) tablet 650 mg (650 mg Oral Given 08/26/21 2013)  lactated ringers bolus 1,000 mL (0 mLs Intravenous Stopped 08/26/21 2103)    And  lactated ringers bolus 800 mL (0 mLs Intravenous Stopped 08/26/21 2226)  aztreonam (AZACTAM) 2 g in sodium chloride  0.9 % 100 mL IVPB (0 g Intravenous Stopped 08/26/21 2103)  metroNIDAZOLE (FLAGYL) IVPB 500 mg (0 mg Intravenous Stopped 08/26/21 2212)  vancomycin (VANCOCIN) 2,250 mg in sodium chloride 0.9 % 500 mL IVPB (0 mg Intravenous Stopped 08/27/21 0113)  fluconazole (DIFLUCAN) tablet 150 mg (150 mg Oral Given 08/26/21 2224)  iohexol (OMNIPAQUE) 300 MG/ML solution 100 mL (100 mLs Intravenous Contrast Given 08/27/21 0013)  potassium chloride SA (KLOR-CON M) CR tablet 40 mEq (40 mEq Oral Given 08/27/21 1016)  magnesium sulfate IVPB 2 g 50 mL (0 g Intravenous Stopped 08/27/21 1324)    Mobility walks Low fall risk   Focused Assessments Cardiac Assessment Handoff:  Cardiac Rhythm: Atrial fibrillation Lab Results  Component Value Date   TROPONINI <0.03 10/18/2016   No results found for: "DDIMER" Does the Patient currently have chest pain? No    R Recommendations: See Admitting Provider Note  Report given to:   Additional Notes: -

## 2021-08-27 NOTE — Assessment & Plan Note (Signed)
.   Patient been placed on Accu-Cheks before every meal and nightly with sliding scale insulin . Holding home regimen of hypoglycemics . Hemoglobin A1C ordered . Diabetic Diet  

## 2021-08-28 DIAGNOSIS — N390 Urinary tract infection, site not specified: Secondary | ICD-10-CM | POA: Diagnosis not present

## 2021-08-28 DIAGNOSIS — A419 Sepsis, unspecified organism: Secondary | ICD-10-CM | POA: Diagnosis not present

## 2021-08-28 LAB — CBC WITH DIFFERENTIAL/PLATELET
Abs Immature Granulocytes: 0.04 10*3/uL (ref 0.00–0.07)
Basophils Absolute: 0 10*3/uL (ref 0.0–0.1)
Basophils Relative: 0 %
Eosinophils Absolute: 0.2 10*3/uL (ref 0.0–0.5)
Eosinophils Relative: 1 %
HCT: 36 % (ref 36.0–46.0)
Hemoglobin: 12.1 g/dL (ref 12.0–15.0)
Immature Granulocytes: 0 %
Lymphocytes Relative: 16 %
Lymphs Abs: 2 10*3/uL (ref 0.7–4.0)
MCH: 30.9 pg (ref 26.0–34.0)
MCHC: 33.6 g/dL (ref 30.0–36.0)
MCV: 91.8 fL (ref 80.0–100.0)
Monocytes Absolute: 1.4 10*3/uL — ABNORMAL HIGH (ref 0.1–1.0)
Monocytes Relative: 11 %
Neutro Abs: 9 10*3/uL — ABNORMAL HIGH (ref 1.7–7.7)
Neutrophils Relative %: 72 %
Platelets: 180 10*3/uL (ref 150–400)
RBC: 3.92 MIL/uL (ref 3.87–5.11)
RDW: 14.4 % (ref 11.5–15.5)
WBC: 12.6 10*3/uL — ABNORMAL HIGH (ref 4.0–10.5)
nRBC: 0 % (ref 0.0–0.2)

## 2021-08-28 LAB — URINE CULTURE: Culture: 50000 — AB

## 2021-08-28 LAB — CERVICOVAGINAL ANCILLARY ONLY
Bacterial Vaginitis (gardnerella): NEGATIVE
Candida Glabrata: POSITIVE — AB
Candida Vaginitis: NEGATIVE
Chlamydia: NEGATIVE
Comment: NEGATIVE
Comment: NEGATIVE
Comment: NEGATIVE
Comment: NEGATIVE
Comment: NEGATIVE
Comment: NORMAL
Neisseria Gonorrhea: NEGATIVE
Trichomonas: NEGATIVE

## 2021-08-28 LAB — BASIC METABOLIC PANEL
Anion gap: 6 (ref 5–15)
BUN: 11 mg/dL (ref 8–23)
CO2: 22 mmol/L (ref 22–32)
Calcium: 8.9 mg/dL (ref 8.9–10.3)
Chloride: 108 mmol/L (ref 98–111)
Creatinine, Ser: 0.72 mg/dL (ref 0.44–1.00)
GFR, Estimated: >60 mL/min (ref >60)
Glucose, Bld: 150 mg/dL — ABNORMAL HIGH (ref 70–99)
Potassium: 3.7 mmol/L (ref 3.5–5.1)
Sodium: 136 mmol/L (ref 135–145)

## 2021-08-28 LAB — GLUCOSE, CAPILLARY
Glucose-Capillary: 185 mg/dL — ABNORMAL HIGH (ref 70–99)
Glucose-Capillary: 138 mg/dL — ABNORMAL HIGH (ref 70–99)
Glucose-Capillary: 127 mg/dL — ABNORMAL HIGH (ref 70–99)

## 2021-08-28 LAB — MAGNESIUM: Magnesium: 1.9 mg/dL (ref 1.7–2.4)

## 2021-08-28 MED ORDER — POTASSIUM CHLORIDE CRYS ER 20 MEQ PO TBCR
40.0000 meq | EXTENDED_RELEASE_TABLET | Freq: Once | ORAL | Status: AC
Start: 2021-08-28 — End: 2021-08-28
  Administered 2021-08-28: 40 meq via ORAL
  Filled 2021-08-28: qty 2

## 2021-08-28 MED ORDER — SODIUM CHLORIDE 0.9 % IV SOLN
1.0000 g | INTRAVENOUS | Status: DC
  Administered 2021-08-28: 1 g via INTRAVENOUS
  Filled 2021-08-28: qty 10

## 2021-08-28 MED ORDER — MAGNESIUM SULFATE IN D5W 1-5 GM/100ML-% IV SOLN
1.0000 g | Freq: Once | INTRAVENOUS | Status: AC
  Administered 2021-08-28: 1 g via INTRAVENOUS
  Filled 2021-08-28: qty 100

## 2021-08-28 NOTE — Progress Notes (Signed)
PROGRESS NOTE    Amy Tran  XAJ:[id number] DOB: 12-06-1955 DOA: 08/26/2021 PCP: Asencion Noble, MD     Brief Narrative:  66 year old female with PMH of HTN, HL, DM2, CAD s/p 2021 stent, PAF (on Tikosyn and Xarelto), chronic iron deficiency anemia who presented to the ED on 7/16 with fevers up to 102.8, chills, WBC 16K, and lactic acidosis 2.5 mmol/L, groin pain, and vaginal discharge.  CT of Abdomen/Pelvis shows no acute abnormality.  She was diagnosed with UTI and vaginitis and started on IV antibiotics as well as one-time oral antifungal.  New events last 24 hours / Subjective: Patient states that she is feeling much better.  Denies any dysuria but had some pelvic pressure, which seems to be improving.  Afebrile last night  Assessment & Plan:   Principal Problem:   Sepsis secondary to UTI [facility]) Active Problems:   Lactic acidosis   Essential hypertension   Coronary artery disease involving native coronary artery of native heart without angina pectoris   Persistent atrial fibrillation (HCC)   Type 2 diabetes mellitus with hyperglycemia, without long-term current use of insulin (HCC)   Vaginal candidiasis   Mixed diabetic hyperlipidemia associated with type 2 diabetes mellitus (HCC)   Hypokalemia   GERD (gastroesophageal reflux disease)   SIRS (systemic inflammatory response syndrome) ([place])    Sepsis secondary to UTI, E. coli -Sepsis present on admission -Urine culture showing E. coli, pansensitive.  Continue Rocephin -Blood cultures pending -CT abdomen pelvis negative -Wet prep negative, STD panel pending.  Patient states that she has not been sexually active in the last 5 years since her husband's passing  Hypertension -Losartan  Paroxysmal A-fib -Xarelto, Tikosyn  Hypokalemia -Replace, keep K >4  Hypomagnesia  -Replace, keep Mg >2  CAD status post stent in 2021 -Plavix, lipitor   Diabetes mellitus type 2 -Sliding scale insulin  Hyperlipidemia -Lipitor    GERD -PPI  Incidental finding:  Indeterminate 3 cm left adrenal nodule present on the study of 2018, likely an adenoma. Follow up outpatient   DVT prophylaxis:   rivaroxaban (XARELTO) tablet 20 mg  Code Status: Full Family Communication: None at bedside Disposition Plan:  Status is: Inpatient Remains inpatient appropriate because: IV antibiotics    Antimicrobials:  Anti-infectives (From admission, onward)    Start     Dose/Rate Route Frequency Ordered Stop   08/28/21 1600  cefTRIAXone (ROCEPHIN) 1 g in sodium chloride 0.9 % 100 mL IVPB        1 g 200 mL/hr over 30 Minutes Intravenous Every 24 hours 08/28/21 0748 09/02/21 1559   08/28/21 1000  azithromycin (ZITHROMAX) tablet 250 mg  Status:  Discontinued       See Hyperspace for full Linked Orders Report.   250 mg Oral Daily 08/27/21 1511 08/28/21 0748   08/27/21 2200  vancomycin (VANCOREADY) IVPB 1750 mg/350 mL  Status:  Discontinued        1,750 mg 175 mL/hr over 120 Minutes Intravenous Every 24 hours 08/26/21 2104 08/27/21 0258   08/27/21 1515  cefTRIAXone (ROCEPHIN) 1 g in sodium chloride 0.9 % 100 mL IVPB  Status:  Discontinued        1 g 200 mL/hr over 30 Minutes Intravenous Every 24 hours 08/27/21 1511 08/28/21 0748   08/27/21 1515  azithromycin (ZITHROMAX) tablet 500 mg       See Hyperspace for full Linked Orders Report.   500 mg Oral  Once 08/27/21 1511 08/27/21 1555   08/27/21 1515  metroNIDAZOLE (FLAGYL) tablet 500 mg  Status:  Discontinued        500 mg Oral Every 12 hours 08/27/21 1511 08/28/21 0748   08/27/21 1000  ceFEPIme (MAXIPIME) 2 g in sodium chloride 0.9 % 100 mL IVPB  Status:  Discontinued        2 g 200 mL/hr over 30 Minutes Intravenous Every 8 hours 08/27/21 0916 08/27/21 1511   08/27/21 0400  aztreonam (AZACTAM) 2 g in sodium chloride 0.9 % 100 mL IVPB  Status:  Discontinued        2 g 200 mL/hr over 30 Minutes Intravenous Every 8 hours 08/26/21 2104 08/27/21 0258   08/27/21 0315  fluconazole  (DIFLUCAN) tablet 150 mg  Status:  Discontinued        150 mg Oral  Once 08/27/21 0300 08/27/21 0300   08/27/21 0300  cefTRIAXone (ROCEPHIN) 2 g in sodium chloride 0.9 % 100 mL IVPB  Status:  Discontinued        2 g 200 mL/hr over 30 Minutes Intravenous Every 24 hours 08/27/21 0258 08/27/21 0917   08/26/21 2200  fluconazole (DIFLUCAN) tablet 150 mg        150 mg Oral  Once 08/26/21 2151 08/26/21 2224   08/26/21 2015  vancomycin (VANCOCIN) 2,250 mg in sodium chloride 0.9 % 500 mL IVPB        2,250 mg 261.3 mL/hr over 120 Minutes Intravenous  Once 08/26/21 2004 08/27/21 0113   08/26/21 2000  aztreonam (AZACTAM) 2 g in sodium chloride 0.9 % 100 mL IVPB        2 g 200 mL/hr over 30 Minutes Intravenous  Once 08/26/21 1956 08/26/21 2103   08/26/21 2000  metroNIDAZOLE (FLAGYL) IVPB 500 mg        500 mg 100 mL/hr over 60 Minutes Intravenous  Once 08/26/21 1956 08/26/21 2212   08/26/21 2000  vancomycin (VANCOCIN) IVPB 1000 mg/200 mL premix  Status:  Discontinued        1,000 mg 200 mL/hr over 60 Minutes Intravenous  Once 08/26/21 1956 08/26/21 2004        Objective: Vitals:   08/27/21 2342 08/27/21 2343 08/28/21 0643 08/28/21 1035  BP:  123/63 107/62 123/90  Pulse:  (!) 59 (!) 58 (!) 51  Resp:  '15 16 18  '$ Temp:  98.3 F (36.8 C)  98.4 F (36.9 C)  TempSrc:  Oral  Oral  SpO2:  100% 94% 95%  Weight: 123.7 kg     Height: '5\' 4"'$  (1.626 m)       Intake/Output Summary (Last 24 hours) at 08/28/2021 1314 Last data filed at 08/28/2021 1000 Gross per 24 hour  Intake 3520 ml  Output 0 ml  Net 3520 ml   Filed Weights   08/26/21 1549 08/27/21 2342  Weight: 125.2 kg 123.7 kg    Examination:  General exam: Appears calm and comfortable  Respiratory system: Clear to auscultation. Respiratory effort normal. No respiratory distress. No conversational dyspnea.  Cardiovascular system: S1 & S2 heard. No murmurs. No pedal edema. Gastrointestinal system: Abdomen is nondistended, soft and nontender.  Normal bowel sounds heard. Central nervous system: Alert and oriented. No focal neurological deficits. Speech clear.  Extremities: Symmetric in appearance  Skin: vulva and labia majora examined. Some mild irritation from pure wick, but otherwise without significant findings, no vaginal discharge seen  Psychiatry: Judgement and insight appear normal. Mood & affect appropriate.   Data Reviewed: I have personally reviewed following labs and imaging studies  CBC: Recent Labs  Lab 08/26/21 1618 08/27/21 0350 08/28/21 0543  WBC 16.8* 25.6* 12.6*  NEUTROABS 16.1* 22.7* 9.0*  HGB 15.0 13.4 12.1  HCT 45.0 40.9 36.0  MCV 92.2 93.4 91.8  PLT 217 212 580   Basic Metabolic Panel: Recent Labs  Lab 08/26/21 1618 08/27/21 0350 08/28/21 0543  NA 135 137 136  K 3.4* 3.4* 3.7  CL 101 108 108  CO2 20* 24 22  GLUCOSE 182* 145* 150*  BUN '11 8 11  '$ CREATININE 0.80 0.74 0.72  CALCIUM 9.7 9.0 8.9  MG  --  1.8 1.9   GFR: Estimated Creatinine Clearance: 89.9 mL/min (by C-G formula based on SCr of 0.72 mg/dL). Liver Function Tests: Recent Labs  Lab 08/26/21 1618 08/27/21 0350  AST 33 20  ALT 18 16  ALKPHOS 73 54  BILITOT 1.8* 1.5*  PROT 6.9 6.0*  ALBUMIN 3.6 3.0*   No results for input(s): "LIPASE", "AMYLASE" in the last 168 hours. No results for input(s): "AMMONIA" in the last 168 hours. Coagulation Profile: Recent Labs  Lab 08/26/21 1618  INR 1.5*   Cardiac Enzymes: No results for input(s): "CKTOTAL", "CKMB", "CKMBINDEX", "TROPONINI" in the last 168 hours. BNP (last 3 results) No results for input(s): "PROBNP" in the last 8760 hours. HbA1C: Recent Labs    08/27/21 0350  HGBA1C 7.8*   CBG: Recent Labs  Lab 08/27/21 1113 08/27/21 1621 08/27/21 2221 08/27/21 2345 08/28/21 1215  GLUCAP 134* 150* 171* 142* 185*   Lipid Profile: No results for input(s): "CHOL", "HDL", "LDLCALC", "TRIG", "CHOLHDL", "LDLDIRECT" in the last 72 hours. Thyroid Function Tests: No results  for input(s): "TSH", "T4TOTAL", "FREET4", "T3FREE", "THYROIDAB" in the last 72 hours. Anemia Panel: No results for input(s): "VITAMINB12", "FOLATE", "FERRITIN", "TIBC", "IRON", "RETICCTPCT" in the last 72 hours. Sepsis Labs: Recent Labs  Lab 08/26/21 1618 08/26/21 1948 08/27/21 0601  PROCALCITON 4.20  --   --   LATICACIDVEN 2.5* 2.0* 2.1*    Recent Results (from the past 240 hour(s))  Blood Culture (routine x 2)     Status: None (Preliminary result)   Collection Time: 08/26/21  4:18 PM   Specimen: BLOOD  Result Value Ref Range Status   Specimen Description BLOOD LEFT ANTECUBITAL  Final   Special Requests   Final    BOTTLES DRAWN AEROBIC AND ANAEROBIC Blood Culture results may not be optimal due to an inadequate volume of blood received in culture bottles   Culture   Final    NO GROWTH < 24 HOURS Performed at [facility], [place] [address] [place], [place] [address]    Report Status PENDING  Incomplete  SARS Coronavirus 2 by RT PCR (hospital order, performed in [facility] hospital lab) *cepheid single result test* Anterior Nasal Swab     Status: None   Collection Time: 08/26/21  7:41 PM   Specimen: Anterior Nasal Swab  Result Value Ref Range Status   SARS Coronavirus 2 by RT PCR NEGATIVE NEGATIVE Final    Comment: (NOTE) SARS-CoV-2 target nucleic acids are NOT DETECTED.  The SARS-CoV-2 RNA is generally detectable in upper and lower respiratory specimens during the acute phase of infection. The lowest concentration of SARS-CoV-2 viral copies this assay can detect is 250 copies / mL. A negative result does not preclude SARS-CoV-2 infection and should not be used as the sole basis for treatment or other patient management decisions.  A negative result may occur with improper specimen collection / handling, submission of specimen other  than nasopharyngeal swab, presence of viral mutation(s) within the areas targeted by this assay, and inadequate number of viral  copies (<250 copies / mL). A negative result must be combined with clinical observations, patient history, and epidemiological information.  Fact Sheet for Patients:   https://www.patel.info/  Fact Sheet for Healthcare Providers: https://hall.com/  This test is not yet approved or  cleared by the [place] FDA and has been authorized for detection and/or diagnosis of SARS-CoV-2 by FDA under an Emergency Use Authorization (EUA).  This EUA will remain in effect (meaning this test can be used) for the duration of the COVID-19 declaration under Section 564(b)(1) of the Act, 21 U.S.C. section 360bbb-3(b)(1), unless the authorization is terminated or revoked sooner.  Performed at [facility], [place] [address] [place], [place] [address]   Blood Culture (routine x 2)     Status: None (Preliminary result)   Collection Time: 08/26/21  7:48 PM   Specimen: BLOOD  Result Value Ref Range Status   Specimen Description BLOOD RIGHT ANTECUBITAL  Final   Special Requests   Final    BOTTLES DRAWN AEROBIC AND ANAEROBIC Blood Culture adequate volume   Culture   Final    NO GROWTH < 24 HOURS Performed at [facility], [place] [address], [place], [place] [address]    Report Status PENDING  Incomplete  Urine Culture     Status: Abnormal   Collection Time: 08/26/21  9:36 PM   Specimen: In/Out Cath Urine  Result Value Ref Range Status   Specimen Description IN/OUT CATH URINE  Final   Special Requests   Final    NONE Performed at [facility], [place] [address], [place], [place] [address]    Culture 50,000 COLONIES/mL ESCHERICHIA COLI (A)  Final   Report Status 08/28/2021 FINAL  Final   Organism ID, Bacteria ESCHERICHIA COLI (A)  Final      Susceptibility   Escherichia coli - MIC*    AMPICILLIN 8 SENSITIVE Sensitive     CEFAZOLIN <=4 SENSITIVE Sensitive     CEFEPIME <=0.12 SENSITIVE Sensitive     CEFTRIAXONE <=0.25 SENSITIVE Sensitive      CIPROFLOXACIN <=0.25 SENSITIVE Sensitive     GENTAMICIN <=1 SENSITIVE Sensitive     IMIPENEM <=0.25 SENSITIVE Sensitive     NITROFURANTOIN <=16 SENSITIVE Sensitive     TRIMETH/SULFA <=20 SENSITIVE Sensitive     AMPICILLIN/SULBACTAM <=2 SENSITIVE Sensitive     PIP/TAZO <=4 SENSITIVE Sensitive     * 50,000 COLONIES/mL ESCHERICHIA COLI  Wet prep, genital     Status: None   Collection Time: 08/27/21  8:30 PM  Result Value Ref Range Status   Yeast Wet Prep HPF POC NONE SEEN NONE SEEN Final   Trich, Wet Prep NONE SEEN NONE SEEN Final   Clue Cells Wet Prep HPF POC NONE SEEN NONE SEEN Final   WBC, Wet Prep HPF POC <10 <10 Final   Sperm NONE SEEN  Final    Comment: Performed at [facility], [place] [address], [place],  [address]      Radiology Studies: CT ABDOMEN PELVIS W CONTRAST  Result Date: 08/27/2021 CLINICAL DATA:  Abdominal pain. EXAM: CT ABDOMEN AND PELVIS WITH CONTRAST TECHNIQUE: Multidetector CT imaging of the abdomen and pelvis was performed using the standard protocol following bolus administration of intravenous contrast. RADIATION DOSE REDUCTION: This exam was performed according to the departmental dose-optimization program which includes automated exposure control, adjustment of the mA and/or  kV according to patient size and/or use of iterative reconstruction technique. CONTRAST:  176m OMNIPAQUE IOHEXOL 300 MG/ML  SOLN COMPARISON:  CT abdomen pelvis dated 10/16/2016. FINDINGS: Lower chest: The visualized lung bases are clear. There is coronary vascular calcification. No intra-abdominal free air or free fluid. Hepatobiliary: Fatty liver. No intrahepatic biliary dilatation. Cholecystectomy. No retained calcified stone noted in the central CBD. Pancreas: Unremarkable. No pancreatic ductal dilatation or surrounding inflammatory changes. Spleen: No acute findings. Adrenals/Urinary Tract: The right adrenal gland is unremarkable. Indeterminate 3 cm left adrenal nodule present  on the study of 2018, likely an adenoma. There is no hydronephrosis on either side. There is symmetric enhancement and excretion of contrast by both kidneys. The visualized ureters and the bladder appear unremarkable. Stomach/Bowel: Small scattered sigmoid diverticula without active inflammatory changes. There is postsurgical changes of gastric bypass. Loose stool noted throughout the colon. There is no bowel obstruction or active inflammation. The appendix is normal. Vascular/Lymphatic: Mild aortoiliac atherosclerotic disease. The IVC is unremarkable. Left common iliac vein stent. Evaluation of the stent is limited on this arterial phase CT. No portal venous gas. There is no adenopathy. Reproductive: The uterus is anteverted and grossly unremarkable. No adnexal masses. Other: Small fat containing umbilical hernia. Musculoskeletal: Lower lumbar disc desiccation and vacuum phenomena. No acute osseous pathology. IMPRESSION: 1. No acute intra-abdominal or pelvic pathology. 2. Colonic diverticulosis. No bowel obstruction. Normal appendix. 3. Fatty liver. 4. Indeterminate 3 cm left adrenal nodule present on the study of 2018, likely an adenoma. 5. Aortic Atherosclerosis (ICD10-I70.0). Electronically Signed   By: AAnner CreteM.D.   On: 08/27/2021 00:25   DG Chest Port 1 View  Result Date: 08/26/2021 CLINICAL DATA:  Questionable sepsis - evaluate for abnormality EXAM: PORTABLE CHEST 1 VIEW COMPARISON:  03/06/2021, CT 01/19/2016 FINDINGS: Upper normal heart size, stable. Mild right greater than left hilar prominence is likely due to prominence of the pulmonary arteries when compared with prior CT. Difficult to exclude adenopathy particularly on the right. The lungs are clear. Pulmonary vasculature is normal. No consolidation, pleural effusion, or pneumothorax. No acute osseous abnormalities are seen. IMPRESSION: 1. Mild right greater than left hilar prominence is likely due to prominence of the pulmonary arteries  when compared with prior CT. Difficult to exclude adenopathy particularly on the right. 2. No acute pulmonary process. Electronically Signed   By: MKeith RakeM.D.   On: 08/26/2021 17:10      Scheduled Meds:  atorvastatin  80 mg Oral QPC supper   clopidogrel  75 mg Oral Q breakfast   dofetilide  250 mcg Oral BID   insulin aspart  0-15 Units Subcutaneous TID AC & HS   loratadine  10 mg Oral QHS   losartan  100 mg Oral Q breakfast   pantoprazole  40 mg Oral Daily   rivaroxaban  20 mg Oral Q supper   sodium chloride flush  3 mL Intravenous Q12H   Continuous Infusions:  cefTRIAXone (ROCEPHIN)  IV     magnesium sulfate bolus IVPB       LOS: 1 day     JDessa Phi DO [facility] 08/28/2021, 1:14 PM   Available via Epic secure chat 7am-7pm After these hours, please refer to coverage provider listed on [email or web address]

## 2021-08-28 NOTE — Plan of Care (Signed)
  Problem: Health Behavior/Discharge Planning: Goal: Ability to manage health-related needs will improve Outcome: Progressing   Problem: Nutritional: Goal: Maintenance of adequate nutrition will improve Outcome: Progressing   Problem: Skin Integrity: Goal: Risk for impaired skin integrity will decrease Outcome: Progressing   Problem: Respiratory: Goal: Ability to maintain adequate ventilation will improve Outcome: Progressing

## 2021-08-28 NOTE — Progress Notes (Signed)
  Transition of Care [facility]) Screening Note   Patient Details  Name: MUSHKA LACONTE Date of Birth: [birth year]   [facility] of Care [facility]) CM/SW Contact:    Tom-Johnson, Renea Ee, RN Phone Number: 08/28/2021, 2:48 PM  Patient is admitted for Sepsis 2/2 UTI. Currently on IV abx. On Tikosyn and Xarelto for PAF. From home with daughter and grand daughter. Has two supporting daughters. Independent with care and drive self prior to admission. Has a walker and shower seat at home.  PCP is Asencion Noble, MD and uses [facility] in [place].   Transition of Care Department [facility]) has reviewed patient and no TOC needs or recommendations have been identified at this time. TOC will continue to monitor patient advancement through interdisciplinary progression rounds. If new patient transition needs arise, please place a TOC consult.

## 2021-08-29 DIAGNOSIS — N390 Urinary tract infection, site not specified: Secondary | ICD-10-CM | POA: Diagnosis not present

## 2021-08-29 DIAGNOSIS — A419 Sepsis, unspecified organism: Secondary | ICD-10-CM | POA: Diagnosis not present

## 2021-08-29 DIAGNOSIS — E1165 Type 2 diabetes mellitus with hyperglycemia: Secondary | ICD-10-CM | POA: Diagnosis not present

## 2021-08-29 LAB — BASIC METABOLIC PANEL
Anion gap: 5 (ref 5–15)
BUN: 11 mg/dL (ref 8–23)
CO2: 22 mmol/L (ref 22–32)
Calcium: 9.1 mg/dL (ref 8.9–10.3)
Chloride: 108 mmol/L (ref 98–111)
Creatinine, Ser: 0.63 mg/dL (ref 0.44–1.00)
GFR, Estimated: >60 mL/min (ref >60)
Glucose, Bld: 137 mg/dL — ABNORMAL HIGH (ref 70–99)
Potassium: 4.3 mmol/L (ref 3.5–5.1)
Sodium: 135 mmol/L (ref 135–145)

## 2021-08-29 LAB — GLUCOSE, CAPILLARY
Glucose-Capillary: 188 mg/dL — ABNORMAL HIGH (ref 70–99)
Glucose-Capillary: 154 mg/dL — ABNORMAL HIGH (ref 70–99)

## 2021-08-29 LAB — CBC
HCT: 36.4 % (ref 36.0–46.0)
Hemoglobin: 12.6 g/dL (ref 12.0–15.0)
MCH: 31.5 pg (ref 26.0–34.0)
MCHC: 34.6 g/dL (ref 30.0–36.0)
MCV: 91 fL (ref 80.0–100.0)
Platelets: 175 10*3/uL (ref 150–400)
RBC: 4 MIL/uL (ref 3.87–5.11)
RDW: 14.5 % (ref 11.5–15.5)
WBC: 8.7 10*3/uL (ref 4.0–10.5)
nRBC: 0 % (ref 0.0–0.2)

## 2021-08-29 LAB — MAGNESIUM: Magnesium: 2.1 mg/dL (ref 1.7–2.4)

## 2021-08-29 MED ORDER — CEFUROXIME AXETIL 500 MG PO TABS: 500.0000 mg | ORAL_TABLET | Freq: Two times a day (BID) | ORAL | 0 refills | Status: AC

## 2021-08-29 NOTE — TOC Transition Note (Signed)
Transition of Care [facility]) - CM/SW Discharge Note   Patient Details  Name: Amy Tran MRN: [id number] Date of Birth: 1955/03/09  Transition of Care [facility]) CM/SW Contact:  Tom-Johnson, Renea Ee, RN Phone Number: 08/29/2021, 4:19 PM   Clinical Narrative:     Patient is scheduled for discharge today. No TOC needs or recommendations noted. Family to transport at discharge. No further TOC needs noted.  Final next level of care: Home/Self Care Barriers to Discharge: Barriers Resolved   Patient Goals and CMS Choice Patient states their goals for this hospitalization and ongoing recovery are:: To return home CMS Medicare.gov Compare Post Acute Care list provided to:: Patient Choice offered to / list presented to : NA  Discharge Placement                Patient to be transferred to facility by: Family      Discharge Plan and Services                DME Arranged: N/A DME Agency: NA       HH Arranged: NA HH Agency: NA        Social Determinants of Health (SDOH) Interventions     Readmission Risk Interventions     No data to display

## 2021-08-29 NOTE — Plan of Care (Signed)
  Problem: Coping: Goal: Ability to adjust to condition or change in health will improve Outcome: Progressing   

## 2021-08-29 NOTE — Discharge Summary (Signed)
Physician Discharge Summary   Patient: Amy Tran MRN: [id number] DOB: 1955-06-21  Admit date:     08/26/2021  Discharge date: 08/29/21  Discharge Physician: Murray Hodgkins   PCP: Asencion Noble, MD   Recommendations at discharge:    Incidental finding: Indeterminate 3 cm left adrenal nodule present on the study of 2018, likely an adenoma. Follow up outpatient   Mild right greater than left hilar prominence is likely due to prominence of the pulmonary arteries when compared with prior CT. Difficult to exclude adenopathy particularly on the right.  Discharge Diagnoses: Principal Problem:   Sepsis secondary to UTI [facility]) Active Problems:   Lactic acidosis   Essential hypertension   Coronary artery disease involving native coronary artery of native heart without angina pectoris   Persistent atrial fibrillation (HCC)   Type 2 diabetes mellitus with hyperglycemia, without long-term current use of insulin (HCC)   Vaginal candidiasis   Mixed diabetic hyperlipidemia associated with type 2 diabetes mellitus (HCC)   Hypokalemia   GERD (gastroesophageal reflux disease)   SIRS (systemic inflammatory response syndrome) ([place])  Resolved Problems:   * No resolved hospital problems. [facility] Course: 47yow presented with body aches; admitted for severe sepsis secondary to UTI.  Improved rapidly with antibiotics, hospitalization uncomplicated.  * Severe sepsis secondary to UTI [facility]) with lactic acidosis --E coli, pansensitive, sepsis resolved.  Discharged home on oral antibiotics. --wet prep negative; no h/o recent sexual activity  Essential hypertension --continue home regimen of oral antihypertensives  Coronary artery disease involving native coronary artery of native heart without angina pectoris --Continue home regimen of antiplatelet therapy, lipid lowering therapy   Persistent atrial fibrillation (HCC) --Currently rate controlled. Continue home regimen of anticoagulation.  Continue home regimen of rate controlling agent with Tikosyn  Vaginal candidiasis glabrata --s/p recent outpatient abx.  Although typical treatment would be Borax capsules or nystatin, her symptoms are resolved, therefore no further Rx at this time.  Type 2 diabetes mellitus with hyperglycemia, without long-term current use of insulin (HCC) --CBG stable. Hgb A1c 7.8  Incidental finding:  Indeterminate 3 cm left adrenal nodule present on the study of 2018, likely an adenoma. Follow up outpatient   Mild right greater than left hilar prominence is likely due to prominence of the pulmonary arteries when compared with prior CT. Difficult to exclude adenopathy particularly on the right.      Consultants:  None  Procedures performed:  None   Disposition: Home Diet recommendation:  Carb modified diet DISCHARGE MEDICATION: Allergies as of 08/29/2021       Reactions   Penicillins Hives, Rash   Has patient had a PCN reaction causing immediate rash, facial/tongue/throat swelling, SOB or lightheadedness with hypotension: Yes Has patient had a PCN reaction causing severe rash involving mucus membranes or skin necrosis: No Has patient had a PCN reaction that required hospitalization No Has patient had a PCN reaction occurring within the last 10 years: No If all of the above answers are "NO", then may proceed with Cephalosporin use.   Doxycycline Itching        Medication List     TAKE these medications    acetaminophen 500 MG tablet Commonly known as: TYLENOL Take 500 mg by mouth every 6 (six) hours as needed for mild pain or headache.   albuterol 108 (90 Base) MCG/ACT inhaler Commonly known as: VENTOLIN HFA Inhale 2 puffs into the lungs every 6 (six) hours as needed for wheezing or shortness of breath.   atorvastatin  80 MG tablet Commonly known as: LIPITOR TAKE 1 TABLET BY MOUTH DAILY AT 6 PM. What changed: when to take this   BD Pen Needle Nano U/F 32G X 4 MM  Misc Generic drug: Insulin Pen Needle USE FOR VICTOZA INJECTIONS   cefUROXime 500 MG tablet Commonly known as: CEFTIN Take 1 tablet (500 mg total) by mouth 2 (two) times daily for 7 days.   cetirizine 10 MG tablet Commonly known as: ZYRTEC Take 10 mg by mouth at bedtime as needed for allergies.   clopidogrel 75 MG tablet Commonly known as: PLAVIX TAKE 1 TABLET (75 MG TOTAL) BY MOUTH DAILY WITH BREAKFAST.   dofetilide 250 MCG capsule Commonly known as: TIKOSYN TAKE 1 CAPSULE BY MOUTH TWICE A DAY   eucerin cream Apply 1 Application topically daily.   fluticasone 50 MCG/ACT nasal spray Commonly known as: FLONASE Place 2 sprays into both nostrils daily as needed for rhinitis (congestion/ dryness).   Jardiance 10 MG Tabs tablet Generic drug: empagliflozin Take 10 mg by mouth daily.   losartan 100 MG tablet Commonly known as: COZAAR Take 100 mg by mouth daily with breakfast.   nitroGLYCERIN 0.4 MG SL tablet Commonly known as: NITROSTAT Place 1 tablet (0.4 mg total) under the tongue every 5 (five) minutes as needed for chest pain.   oxyCODONE-acetaminophen 5-325 MG tablet Commonly known as: PERCOCET/ROXICET Take 1 tablet by mouth 3 (three) times daily as needed for pain.   potassium chloride SA 20 MEQ tablet Commonly known as: KLOR-CON M Take 1 tablet (20 mEq total) by mouth 2 (two) times daily.   spironolactone 25 MG tablet Commonly known as: ALDACTONE Take 25 mg by mouth daily with breakfast.   torsemide 20 MG tablet Commonly known as: DEMADEX Take 60 mg by mouth daily after breakfast.   Victoza 18 MG/3ML Sopn Generic drug: liraglutide Inject 1.8 mg into the skin at bedtime.   VITAMIN D PO Take 2,000 Units by mouth daily.   Xarelto 20 MG Tabs tablet Generic drug: rivaroxaban TAKE 1 TABLET BY MOUTH EVERYDAY AT BEDTIME What changed: See the new instructions.        Follow-up Information     Asencion Noble, MD Follow up.   Specialty: Internal  Medicine Why: As needed Contact information: [address] [place] [address] [phone]         Arnoldo Lenis, MD .   Specialty: Cardiology Contact information: [address] [place] [address] [phone]                Feels good Has chronic bradycardia   Discharge Exam: Filed Weights   08/26/21 1549 08/27/21 2342 08/29/21 0536  Weight: 125.2 kg 123.7 kg 125 kg   Physical Exam Vitals reviewed.  Constitutional:      General: She is not in acute distress.    Appearance: She is not ill-appearing or toxic-appearing.  Cardiovascular:     Rate and Rhythm: Normal rate and regular rhythm.     Heart sounds: No murmur heard. Pulmonary:     Effort: Pulmonary effort is normal. No respiratory distress.     Breath sounds: No wheezing, rhonchi or rales.  Neurological:     Mental Status: She is alert.  Psychiatric:        Mood and Affect: Mood normal.        Behavior: Behavior normal.      Condition at discharge: good  The results of significant diagnostics from this hospitalization (including imaging, microbiology,  ancillary and laboratory) are listed below for reference.   Imaging Studies: CT ABDOMEN PELVIS W CONTRAST  Result Date: 08/27/2021 CLINICAL DATA:  Abdominal pain. EXAM: CT ABDOMEN AND PELVIS WITH CONTRAST TECHNIQUE: Multidetector CT imaging of the abdomen and pelvis was performed using the standard protocol following bolus administration of intravenous contrast. RADIATION DOSE REDUCTION: This exam was performed according to the departmental dose-optimization program which includes automated exposure control, adjustment of the mA and/or kV according to patient size and/or use of iterative reconstruction technique. CONTRAST:  125m OMNIPAQUE IOHEXOL 300 MG/ML  SOLN COMPARISON:  CT abdomen pelvis dated 10/16/2016. FINDINGS: Lower chest: The visualized lung bases are clear. There is coronary vascular calcification. No intra-abdominal  free air or free fluid. Hepatobiliary: Fatty liver. No intrahepatic biliary dilatation. Cholecystectomy. No retained calcified stone noted in the central CBD. Pancreas: Unremarkable. No pancreatic ductal dilatation or surrounding inflammatory changes. Spleen: No acute findings. Adrenals/Urinary Tract: The right adrenal gland is unremarkable. Indeterminate 3 cm left adrenal nodule present on the study of 2018, likely an adenoma. There is no hydronephrosis on either side. There is symmetric enhancement and excretion of contrast by both kidneys. The visualized ureters and the bladder appear unremarkable. Stomach/Bowel: Small scattered sigmoid diverticula without active inflammatory changes. There is postsurgical changes of gastric bypass. Loose stool noted throughout the colon. There is no bowel obstruction or active inflammation. The appendix is normal. Vascular/Lymphatic: Mild aortoiliac atherosclerotic disease. The IVC is unremarkable. Left common iliac vein stent. Evaluation of the stent is limited on this arterial phase CT. No portal venous gas. There is no adenopathy. Reproductive: The uterus is anteverted and grossly unremarkable. No adnexal masses. Other: Small fat containing umbilical hernia. Musculoskeletal: Lower lumbar disc desiccation and vacuum phenomena. No acute osseous pathology. IMPRESSION: 1. No acute intra-abdominal or pelvic pathology. 2. Colonic diverticulosis. No bowel obstruction. Normal appendix. 3. Fatty liver. 4. Indeterminate 3 cm left adrenal nodule present on the study of 2018, likely an adenoma. 5. Aortic Atherosclerosis (ICD10-I70.0). Electronically Signed   By: AAnner CreteM.D.   On: 08/27/2021 00:25   DG Chest Port 1 View  Result Date: 08/26/2021 CLINICAL DATA:  Questionable sepsis - evaluate for abnormality EXAM: PORTABLE CHEST 1 VIEW COMPARISON:  03/06/2021, CT 01/19/2016 FINDINGS: Upper normal heart size, stable. Mild right greater than left hilar prominence is likely due  to prominence of the pulmonary arteries when compared with prior CT. Difficult to exclude adenopathy particularly on the right. The lungs are clear. Pulmonary vasculature is normal. No consolidation, pleural effusion, or pneumothorax. No acute osseous abnormalities are seen. IMPRESSION: 1. Mild right greater than left hilar prominence is likely due to prominence of the pulmonary arteries when compared with prior CT. Difficult to exclude adenopathy particularly on the right. 2. No acute pulmonary process. Electronically Signed   By: MKeith RakeM.D.   On: 08/26/2021 17:10    Microbiology: Results for orders placed or performed during the hospital encounter of 08/26/21  Blood Culture (routine x 2)     Status: None (Preliminary result)   Collection Time: 08/26/21  4:18 PM   Specimen: BLOOD  Result Value Ref Range Status   Specimen Description BLOOD LEFT ANTECUBITAL  Final   Special Requests   Final    BOTTLES DRAWN AEROBIC AND ANAEROBIC Blood Culture results may not be optimal due to an inadequate volume of blood received in culture bottles   Culture   Final    NO GROWTH 3 DAYS [facility]formed [facility],  [address], [place], [place] [address]    Report Status PENDING  Incomplete  SARS Coronavirus 2 by RT PCR (hospital order, performed in [facility] hospital lab) *cepheid single result test* Anterior Nasal Swab     Status: None   Collection Time: 08/26/21  7:41 PM   Specimen: Anterior Nasal Swab  Result Value Ref Range Status   SARS Coronavirus 2 by RT PCR NEGATIVE NEGATIVE Final    Comment: (NOTE) SARS-CoV-2 target nucleic acids are NOT DETECTED.  The SARS-CoV-2 RNA is generally detectable in upper and lower respiratory specimens during the acute phase of infection. The lowest concentration of SARS-CoV-2 viral copies this assay can detect is 250 copies / mL. A negative result does not preclude SARS-CoV-2 infection and should not be used as the sole basis for treatment or  other patient management decisions.  A negative result may occur with improper specimen collection / handling, submission of specimen other than nasopharyngeal swab, presence of viral mutation(s) within the areas targeted by this assay, and inadequate number of viral copies (<250 copies / mL). A negative result must be combined with clinical observations, patient history, and epidemiological information.  Fact Sheet for Patients:   https://www.patel.info/  Fact Sheet for Healthcare Providers: https://hall.com/  This test is not yet approved or  cleared by the [place] FDA and has been authorized for detection and/or diagnosis of SARS-CoV-2 by FDA under an Emergency Use Authorization (EUA).  This EUA will remain in effect (meaning this test can be used) for the duration of the COVID-19 declaration under Section 564(b)(1) of the Act, 21 U.S.C. section 360bbb-3(b)(1), unless the authorization is terminated or revoked sooner.  Performed at [facility], [place] [address], [place], [place] [address]   Blood Culture (routine x 2)     Status: None (Preliminary result)   Collection Time: 08/26/21  7:48 PM   Specimen: BLOOD  Result Value Ref Range Status   Specimen Description BLOOD RIGHT ANTECUBITAL  Final   Special Requests   Final    BOTTLES DRAWN AEROBIC AND ANAEROBIC Blood Culture adequate volume   Culture   Final    NO GROWTH 3 DAYS Performed at [facility], [place] [address], [place], [place] [address]    Report Status PENDING  Incomplete  Urine Culture     Status: Abnormal   Collection Time: 08/26/21  9:36 PM   Specimen: In/Out Cath Urine  Result Value Ref Range Status   Specimen Description IN/OUT CATH URINE  Final   Special Requests   Final    NONE Performed at [facility], [place] [address], [place], [place] [address]    Culture 50,000 COLONIES/mL ESCHERICHIA COLI (A)  Final   Report Status 08/28/2021 FINAL   Final   Organism ID, Bacteria ESCHERICHIA COLI (A)  Final      Susceptibility   Escherichia coli - MIC*    AMPICILLIN 8 SENSITIVE Sensitive     CEFAZOLIN <=4 SENSITIVE Sensitive     CEFEPIME <=0.12 SENSITIVE Sensitive     CEFTRIAXONE <=0.25 SENSITIVE Sensitive     CIPROFLOXACIN <=0.25 SENSITIVE Sensitive     GENTAMICIN <=1 SENSITIVE Sensitive     IMIPENEM <=0.25 SENSITIVE Sensitive     NITROFURANTOIN <=16 SENSITIVE Sensitive     TRIMETH/SULFA <=20 SENSITIVE Sensitive     AMPICILLIN/SULBACTAM <=2 SENSITIVE Sensitive     PIP/TAZO <=4 SENSITIVE Sensitive     * 50,000 COLONIES/mL ESCHERICHIA COLI  Wet prep, genital  Status: None   Collection Time: 08/27/21  8:30 PM  Result Value Ref Range Status   Yeast Wet Prep HPF POC NONE SEEN NONE SEEN Final   Trich, Wet Prep NONE SEEN NONE SEEN Final   Clue Cells Wet Prep HPF POC NONE SEEN NONE SEEN Final   WBC, Wet Prep HPF POC <10 <10 Final   Sperm NONE SEEN  Final    Comment: Performed at [facility], [place] [address] [place], [place] [address]    Labs: CBC: Recent Labs  Lab 08/26/21 1618 08/27/21 0350 08/28/21 0543 08/29/21 0523  WBC 16.8* 25.6* 12.6* 8.7  NEUTROABS 16.1* 22.7* 9.0*  --   HGB 15.0 13.4 12.1 12.6  HCT 45.0 40.9 36.0 36.4  MCV 92.2 93.4 91.8 91.0  PLT 217 212 180 947   Basic Metabolic Panel: Recent Labs  Lab 08/26/21 1618 08/27/21 0350 08/28/21 0543 08/29/21 0523  NA 135 137 136 135  K 3.4* 3.4* 3.7 4.3  CL 101 108 108 108  CO2 20* '24 22 22  '$ GLUCOSE 182* 145* 150* 137*  BUN '11 8 11 11  '$ CREATININE 0.80 0.74 0.72 0.63  CALCIUM 9.7 9.0 8.9 9.1  MG  --  1.8 1.9 2.1   Liver Function Tests: Recent Labs  Lab 08/26/21 1618 08/27/21 0350  AST 33 20  ALT 18 16  ALKPHOS 73 54  BILITOT 1.8* 1.5*  PROT 6.9 6.0*  ALBUMIN 3.6 3.0*   CBG: Recent Labs  Lab 08/28/21 1215 08/28/21 1649 08/28/21 2124 08/29/21 0738 08/29/21 1122  GLUCAP 185* 138* 127* 188* 154*    Discharge time spent: greater  than 30 minutes.  Signed: Murray Hodgkins, MD [facility] 08/29/2021

## 2021-08-31 ENCOUNTER — Other Ambulatory Visit (HOSPITAL_COMMUNITY): Payer: Self-pay | Admitting: Internal Medicine

## 2021-08-31 LAB — CULTURE, BLOOD (ROUTINE X 2)
Culture: NO GROWTH
Culture: NO GROWTH
Special Requests: ADEQUATE

## 2021-09-03 ENCOUNTER — Other Ambulatory Visit (HOSPITAL_COMMUNITY): Payer: Self-pay | Admitting: Internal Medicine

## 2021-09-03 DIAGNOSIS — Z780 Asymptomatic menopausal state: Secondary | ICD-10-CM

## 2021-09-03 DIAGNOSIS — E1129 Type 2 diabetes mellitus with other diabetic kidney complication: Secondary | ICD-10-CM | POA: Diagnosis not present

## 2021-09-04 ENCOUNTER — Other Ambulatory Visit: Payer: Self-pay | Admitting: Vascular Surgery

## 2021-09-06 ENCOUNTER — Encounter: Payer: Self-pay | Admitting: Cardiology

## 2021-09-06 ENCOUNTER — Encounter: Payer: Self-pay | Admitting: *Deleted

## 2021-09-06 ENCOUNTER — Ambulatory Visit (INDEPENDENT_AMBULATORY_CARE_PROVIDER_SITE_OTHER): Payer: Medicare Other | Admitting: Cardiology

## 2021-09-06 VITALS — BP 106/58 | HR 55 | Ht 64.0 in | Wt 262.5 lb

## 2021-09-06 DIAGNOSIS — I350 Nonrheumatic aortic (valve) stenosis: Secondary | ICD-10-CM

## 2021-09-06 DIAGNOSIS — I10 Essential (primary) hypertension: Secondary | ICD-10-CM

## 2021-09-06 DIAGNOSIS — I4891 Unspecified atrial fibrillation: Secondary | ICD-10-CM

## 2021-09-06 DIAGNOSIS — I2510 Atherosclerotic heart disease of native coronary artery without angina pectoris: Secondary | ICD-10-CM | POA: Diagnosis not present

## 2021-09-06 DIAGNOSIS — R600 Localized edema: Secondary | ICD-10-CM | POA: Diagnosis not present

## 2021-09-06 MED ORDER — RIVAROXABAN 20 MG PO TABS: 20.0000 mg | ORAL_TABLET | Freq: Every day | ORAL | 6 refills | Status: DC

## 2021-09-06 NOTE — Patient Instructions (Addendum)
Medication Instructions:  Xarelto refilled today  Continue all current medications.  Labwork: none  Testing/Procedures: Your physician has requested that you have an echocardiogram. Echocardiography is a painless test that uses sound waves to create images of your heart. It provides your doctor with information about the size and shape of your heart and how well your heart's chambers and valves are working. This procedure takes approximately one hour. There are no restrictions for this procedure.  Office will contact with results via phone, letter or mychart.     Follow-Up: 6 months   Any Other Special Instructions Will Be Listed Below (If Applicable).   If you need a refill on your cardiac medications before your next appointment, please call your pharmacy.

## 2021-09-06 NOTE — Progress Notes (Signed)
Clinical Summary Ms. Kue is a 66 y.o.female seen today for follow up of the following medical problems.    1.CAD -  history of DES to RCA 04/2019 in setting of NSTEMI - 04/2019 echo: LVEF 55-60%, no WMAs   -She is on plavix for recent lower extremity intervention,remains on xarelto for afib  - no recent chest pains. No SOB/DOE - compliant with meds     2.Aortic stenosis -04/2019 echo mean grad 16, AVA VTI 1.4 - no recent symptoms   3.Afib - followed by EP, has been on dofetilide - no recent palpitations - no recent presyncope, or syncope  - no recent palpitations - compliant with meds. No bleeding on xarelto.  - from recent admission with sepsis EKGs did show rate controlled afib   4.PAD - followed by vascular - 09/2020 balloon  left PT, mechanical thrombectomy prox left popliteal, balloon left pop, stent prox left pop     5. Fatigue - has not been interested in sleep sudy       6. Abnormal PFTs - followed by pulmonary Dr Halford Chessman. Started on inhalers recently after abnormal PFTs thought to be asthma       7. LE edema - 2021 echo LVEF 63-87, indet diastolic function. Prior DVT left leg - has been evaluated by vascular -likely related to obesity, perhaps venous insufficiency. Previous echo fairly mild findings    - using home compressoin pump - taking torsemie '60mg'$  daily   8. Hyperlipidemia - upcoming labs with pcp - labs follower by pcp   9. HTN - compliant with meds   10. Recent admit sepsis - 08/2021 admit sepsis, UTI Past Medical History:  Diagnosis Date   Arthritis    Asthma    Cancer ([place])    multiple skin cancers   Diabetes mellitus without complication ([place])    Diverticulosis    Fluid retention    GERD (gastroesophageal reflux disease)    High cholesterol    Hypertension    Incomplete rotator cuff tear    Myocardial infarction (H[facility])    Obesity    Persistent atrial fibrillation (H[facility])    PONV (postoperative nausea and vomiting)     Wears glasses    Wears partial dentures    bottom     Allergies  Allergen Reactions   Penicillins Hives and Rash    Has patient had a PCN reaction causing immediate rash, facial/tongue/throat swelling, SOB or lightheadedness with hypotension: Yes Has patient had a PCN reaction causing severe rash involving mucus membranes or skin necrosis: No Has patient had a PCN reaction that required hospitalization No Has patient had a PCN reaction occurring within the last 10 years: No If all of the above answers are "NO", then may proceed with Cephalosporin use.    Doxycycline Itching     Current Outpatient Medications  Medication Sig Dispense Refill   acetaminophen (TYLENOL) 500 MG tablet Take 500 mg by mouth every 6 (six) hours as needed for mild pain or headache.     albuterol (VENTOLIN HFA) 108 (90 Base) MCG/ACT inhaler Inhale 2 puffs into the lungs every 6 (six) hours as needed for wheezing or shortness of breath.     atorvastatin (LIPITOR) 80 MG tablet TAKE 1 TABLET BY MOUTH DAILY AT 6 PM. (Patient taking differently: Take 80 mg by mouth daily.) 90 tablet 2   BD PEN NEEDLE NANO U/F 32G X 4 MM MISC USE FOR VICTOZA INJECTIONS     cetirizine (ZYRTEC) 10  MG tablet Take 10 mg by mouth at bedtime as needed for allergies.     clopidogrel (PLAVIX) 75 MG tablet TAKE 1 TABLET (75 MG TOTAL) BY MOUTH DAILY WITH BREAKFAST. 90 tablet 3   dofetilide (TIKOSYN) 250 MCG capsule Take 1 capsule (250 mcg total) by mouth 2 (two) times daily. 60 capsule 0   fluticasone (FLONASE) 50 MCG/ACT nasal spray Place 2 sprays into both nostrils daily as needed for rhinitis (congestion/ dryness).     JARDIANCE 10 MG TABS tablet Take 10 mg by mouth daily.     losartan (COZAAR) 100 MG tablet Take 100 mg by mouth daily with breakfast.      nitroGLYCERIN (NITROSTAT) 0.4 MG SL tablet Place 1 tablet (0.4 mg total) under the tongue every 5 (five) minutes as needed for chest pain. 30 tablet 0   oxyCODONE-acetaminophen  (PERCOCET/ROXICET) 5-325 MG tablet Take 1 tablet by mouth 3 (three) times daily as needed for pain.     potassium chloride SA (KLOR-CON) 20 MEQ tablet Take 1 tablet (20 mEq total) by mouth 2 (two) times daily.     Skin Protectants, Misc. (EUCERIN) cream Apply 1 Application topically daily.     spironolactone (ALDACTONE) 25 MG tablet Take 25 mg by mouth daily with breakfast.      torsemide (DEMADEX) 20 MG tablet Take 60 mg by mouth daily after breakfast.      VICTOZA 18 MG/3ML SOPN Inject 1.8 mg into the skin at bedtime.      VITAMIN D PO Take 2,000 Units by mouth daily.     XARELTO 20 MG TABS tablet TAKE 1 TABLET BY MOUTH EVERYDAY AT BEDTIME (Patient taking differently: Take 20 mg by mouth every evening.) 30 tablet 2   No current facility-administered medications for this visit.     Past Surgical History:  Procedure Laterality Date   APPENDECTOMY     CARDIOVERSION N/A 06/03/2019   Procedure: CARDIOVERSION;  Surgeon: Herminio Commons, MD;  Location: [facility];  Service: Cardiovascular;  Laterality: N/A;   CARDIOVERSION N/A 07/13/2019   Procedure: CARDIOVERSION;  Surgeon: Geralynn Rile, MD;  Location: [facility];  Service: Cardiovascular;  Laterality: N/A;   CARPAL TUNNEL RELEASE Bilateral 2001   bilateral   CERVICAL POLYPECTOMY  04/08/2017   Procedure: POLYPECTOMY;  Surgeon: [name], MD;  Location: [facility];  Service: Gynecology;;  Endometrial   CHOLECYSTECTOMY     COLONOSCOPY  2007   DTO:IZTIWPY internal hemorrhoids. Diminutive rectal polyp at 10 cm, cold biopsied/removed. The remainder of the rectal mucosa appeared normal Swallow left-sided diverticula. Diminutive polyp at the splenic flexure cold biopsied/removed (adenomatous)   COLONOSCOPY N/A 12/28/2013   Procedure: COLONOSCOPY;  Surgeon: Daneil Dolin, MD;  [facility];  Service: Endoscopy;  Laterality: N/A;  730 - moved to 8:30 - Ginger notified pt   CORONARY STENT INTERVENTION N/A 04/27/2019   Procedure:  CORONARY STENT INTERVENTION;  Surgeon: Troy Sine, MD;  Location: [facility];  Service: Cardiovascular;  Laterality: N/A;   DILATION AND CURETTAGE, DIAGNOSTIC / THERAPEUTIC  03/07/2017   ERCP  2002   GASTRIC BYPASS  2004   HYSTEROSCOPY WITH D & C N/A 04/08/2017   Procedure: DILATATION AND CURETTAGE /HYSTEROSCOPY;  Surgeon: [name], MD;  Location: [facility];  Service: Gynecology;  Laterality: N/A;   INTRAVASCULAR ULTRASOUND/IVUS N/A 09/12/2017   Procedure: INTRAVASCULAR ULTRASOUND/IVUS;  Surgeon: Elam Dutch, MD;  Location: [facility];  Service: Cardiovascular;  Laterality: N/A;  LEFT HEART CATH AND CORONARY ANGIOGRAPHY N/A 04/27/2019   Procedure: LEFT HEART CATH AND CORONARY ANGIOGRAPHY;  Surgeon: Troy Sine, MD;  Location: [facility];  Service: Cardiovascular;  Laterality: N/A;   LOWER EXTREMITY ANGIOGRAPHY Left 10/02/2020   Procedure: LOWER EXTREMITY ANGIOGRAPHY;  Surgeon: Algernon Huxley, MD;  Location: [facility];  Service: Cardiovascular;  Laterality: Left;   NECK SURGERY  1998   fusion   PERCUTANEOUS VENOUS THROMBECTOMY,LYSIS WITH INTRAVASCULAR ULTRASOUND (IVUS) Left 09/13/2017   Procedure: REMOVAL OF LYSIS CATHETER AND INTRAVASCULAR ULTRASOUND (IVUS) LEFT ILIAC VEIN;  Surgeon: Elam Dutch, MD;  Location: [place];  Service: Vascular;  Laterality: Left;   PERIPHERAL VASCULAR INTERVENTION  09/15/2017   Procedure: PERIPHERAL VASCULAR INTERVENTION;  Surgeon: Waynetta Sandy, MD;  Location: [facility];  Service: Cardiovascular;;  VEINOUS/STENT   PERIPHERAL VASCULAR THROMBECTOMY Left 09/12/2017   Procedure: PERIPHERAL VASCULAR THROMBECTOMY;  Surgeon: Elam Dutch, MD;  Location: [facility];  Service: Cardiovascular;  Laterality: Left;   PERIPHERAL VASCULAR THROMBECTOMY Left 09/15/2017   Procedure: PERIPHERAL VASCULAR THROMBECTOMY;  Surgeon: Waynetta Sandy, MD;  Location: [facility];  Service:  Cardiovascular;  Laterality: Left;  IVC TO LT FEM/POP VEIN   SHOULDER ARTHROSCOPY WITH SUBACROMIAL DECOMPRESSION Left 06/23/2013   Procedure: LEFT SHOULDER ARTHROSCOPY WITH DEBRIDEMENT ROTATOR CUFF AND LABRUM;  Surgeon: Lorn Junes, MD;  Location: [facility];  Service: Orthopedics;  Laterality: Left;   SHOULDER SURGERY  1999   left     Allergies  Allergen Reactions   Penicillins Hives and Rash    Has patient had a PCN reaction causing immediate rash, facial/tongue/throat swelling, SOB or lightheadedness with hypotension: Yes Has patient had a PCN reaction causing severe rash involving mucus membranes or skin necrosis: No Has patient had a PCN reaction that required hospitalization No Has patient had a PCN reaction occurring within the last 10 years: No If all of the above answers are "NO", then may proceed with Cephalosporin use.    Doxycycline Itching      Family History  Problem Relation Age of Onset   Diabetes Father    Hypertension Father    Parkinson's disease Father    Cancer Father        not sure what kind   Hypertension Mother    Heart disease Mother    Drug abuse Sister    Mesothelioma Brother    Cancer Daughter        non-hodgkins lmphoma   Hypertension Brother    Diabetes Brother    Colon cancer Neg Hx    Gastric cancer Neg Hx    Esophageal cancer Neg Hx      Social History Ms. Mcnally reports that she has been smoking cigarettes. She started smoking about 51 years ago. She has a 8.70 pack-year smoking history. She has never used smokeless tobacco. Ms. Riesen reports that she does not currently use alcohol.   Review of Systems CONSTITUTIONAL: No weight loss, fever, chills, weakness or fatigue.  HEENT: Eyes: No visual loss, blurred vision, double vision or yellow sclerae.No hearing loss, sneezing, congestion, runny nose or sore throat.  SKIN: No rash or itching.  CARDIOVASCULAR: per hpi RESPIRATORY: No shortness of breath, cough or  sputum.  GASTROINTESTINAL: No anorexia, nausea, vomiting or diarrhea. No abdominal pain or blood.  GENITOURINARY: No burning on urination, no polyuria NEUROLOGICAL: No headache, dizziness, syncope, paralysis, ataxia, numbness or tingling in the extremities. No  change in bowel or bladder control.  MUSCULOSKELETAL: No muscle, back pain, joint pain or stiffness.  LYMPHATICS: No enlarged nodes. No history of splenectomy.  PSYCHIATRIC: No history of depression or anxiety.  ENDOCRINOLOGIC: No reports of sweating, cold or heat intolerance. No polyuria or polydipsia.  Marland Kitchen   Physical Examination Today's Vitals   09/06/21 1502  BP: (!) 106/58  Pulse: (!) 55  SpO2: 91%  Weight: 262 lb 8 oz (119.1 kg)  Height: '5\' 4"'$  (1.626 m)   Body mass index is 45.06 kg/m.  Gen: resting comfortably, no acute distress HEENT: no scleral icterus, pupils equal round and reactive, no palptable cervical adenopathy,  CV: RRR, 3/6 systolic murmur rusb, no jvd Resp: Clear to auscultation bilaterally GI: abdomen is soft, non-tender, non-distended, normal bowel sounds, no hepatosplenomegaly MSK: extremities are warm, no edema.  Skin: warm, no rash Neuro:  no focal deficits Psych: appropriate affect   Diagnostic Studies  01/2016 echo Study Conclusions   - Left ventricle: The cavity size was mildly dilated. Wall   thickness was normal. Systolic function was normal. The estimated   ejection fraction was in the range of 60% to 65%. The study is   not technically sufficient to allow evaluation of LV diastolic   function. - Pulmonary arteries: PA peak pressure: 32 mm Hg (S). - Pericardium, extracardiac: A trivial pericardial effusion was   identified.   04/2016 Lexiscan There was no ST segment deviation noted during stress. The study is normal. There are no perfusion defects consistent with prior infarct or current ischemia. Anterior fixed defect likely causes by breast attenuation. This is a low risk  study. The left ventricular ejection fraction is normal (55-65%).     04/2019 cath Prox RCA lesion is 95% stenosed. Post intervention, there is a 0% residual stenosis. A stent was successfully placed.   Acute coronary syndrome secondary to high-grade thrombotic stenosis in the very proximal RCA in a dominant RCA vessel.   Normal left coronary circulation with a short left main, large LAD and left circumflex vessels.   Dominant RCA with 95% very proximal stenosis with thrombus burden with TIMI-3 flow.   Successful percutaneous coronary prevention with PTCA and ultimate stenting with a 3.0 x18 mm Resolute Onyx DES stent postdilated to 3.73 mm tapering to 3.68 mm with the percent stenosis being reduced to 0% and brisk TIMI-3 flow without evidence for dissection.   RECOMMENDATION: Consider initial triple drug therapy for 1 month with continuation of Plavix/Xarelto for at least 6 months in this patient on long-term anticoagulation therapy.     08/2018 sleep study Mild obstructive sleep apnea is documented with this recording.  The severity does not require positive pressure treatment.   Assessment and Plan    1.CAD - on xarelto for afib and plavix for recent lower extremity intervention, not for cardiac indication - no beta blocker due to bradycardia - no symptoms, continue current meds     2. HTN - at goal, continue current meds   3. LE edema - likely related to obesity and venous insufficiency, fairly benign echo  - overall stable, continue diueritic and compression pump  4. Afib - did have recurrence during recent admission with sepsis likely driven by systemic illness, rates were controlled - EKG today shows back in sinus rhythm - continue tikasyn, continue xarelto    F/u 6 months  Arnoldo Lenis, M.D.

## 2021-09-11 ENCOUNTER — Ambulatory Visit (INDEPENDENT_AMBULATORY_CARE_PROVIDER_SITE_OTHER): Payer: Medicare Other

## 2021-09-11 DIAGNOSIS — I350 Nonrheumatic aortic (valve) stenosis: Secondary | ICD-10-CM

## 2021-09-11 LAB — ECHOCARDIOGRAM COMPLETE
AR max vel: 1.49 cm2
AV Area VTI: 1.23 cm2
AV Area mean vel: 1.35 cm2
AV Mean grad: 15 mmHg
AV Peak grad: 26.4 mmHg
Ao pk vel: 2.57 m/s
Area-P 1/2: 3.54 cm2
Calc EF: 74.5 %
MV M vel: 3.53 m/s
MV Peak grad: 49.8 mmHg
P 1/2 time: 1455 msec
S' Lateral: 2.22 cm
Single Plane A2C EF: 72.9 %
Single Plane A4C EF: 76.3 %

## 2021-09-13 ENCOUNTER — Ambulatory Visit (HOSPITAL_COMMUNITY)
Admission: RE | Admit: 2021-09-13 | Discharge: 2021-09-13 | Disposition: A | Discharge status: Home / Self Care | Payer: Medicare Other | Source: Ambulatory Visit | Attending: Internal Medicine | Admitting: Internal Medicine

## 2021-09-13 DIAGNOSIS — M85852 Other specified disorders of bone density and structure, left thigh: Secondary | ICD-10-CM | POA: Diagnosis not present

## 2021-09-13 DIAGNOSIS — M810 Age-related osteoporosis without current pathological fracture: Secondary | ICD-10-CM | POA: Diagnosis not present

## 2021-09-13 DIAGNOSIS — Z780 Asymptomatic menopausal state: Secondary | ICD-10-CM | POA: Insufficient documentation

## 2021-09-17 ENCOUNTER — Encounter: Payer: Self-pay | Admitting: *Deleted

## 2021-09-29 ENCOUNTER — Other Ambulatory Visit (HOSPITAL_COMMUNITY): Payer: Self-pay | Admitting: Cardiology

## 2021-10-18 ENCOUNTER — Other Ambulatory Visit: Payer: Self-pay | Admitting: Cardiology

## 2021-10-19 ENCOUNTER — Other Ambulatory Visit (INDEPENDENT_AMBULATORY_CARE_PROVIDER_SITE_OTHER): Payer: Self-pay | Admitting: Nurse Practitioner

## 2021-10-19 ENCOUNTER — Telehealth: Payer: Self-pay | Admitting: Cardiology

## 2021-10-19 DIAGNOSIS — I739 Peripheral vascular disease, unspecified: Secondary | ICD-10-CM

## 2021-10-19 MED ORDER — DOFETILIDE 250 MCG PO CAPS: 250.0000 ug | ORAL_CAPSULE | Freq: Two times a day (BID) | ORAL | 1 refills | Status: DC

## 2021-10-19 NOTE — Telephone Encounter (Signed)
Rx sent to [facility], per patient request.

## 2021-10-19 NOTE — Telephone Encounter (Signed)
*  STAT* If patient is at the pharmacy, call can be transferred to refill team.   1. Which medications need to be refilled? (please list name of each medication and dose if known)   dofetilide (TIKOSYN) 250 MCG capsule  2. Which pharmacy/location (including street and city if local pharmacy) is medication to be sent to?  [id number] [place] [place] - [address]. Do they need a 30 day or 90 day supply?  90 day  Caller stated patient is completely out of this medication.

## 2021-10-22 ENCOUNTER — Ambulatory Visit (INDEPENDENT_AMBULATORY_CARE_PROVIDER_SITE_OTHER): Payer: Medicare Other

## 2021-10-22 ENCOUNTER — Ambulatory Visit (INDEPENDENT_AMBULATORY_CARE_PROVIDER_SITE_OTHER): Payer: Medicare Other | Admitting: Nurse Practitioner

## 2021-10-22 ENCOUNTER — Encounter (INDEPENDENT_AMBULATORY_CARE_PROVIDER_SITE_OTHER): Payer: Self-pay | Admitting: Nurse Practitioner

## 2021-10-22 VITALS — BP 154/76 | HR 51 | Resp 18 | Ht 64.0 in | Wt 269.8 lb

## 2021-10-22 DIAGNOSIS — E119 Type 2 diabetes mellitus without complications: Secondary | ICD-10-CM | POA: Diagnosis not present

## 2021-10-22 DIAGNOSIS — Z9889 Other specified postprocedural states: Secondary | ICD-10-CM | POA: Diagnosis not present

## 2021-10-22 DIAGNOSIS — I890 Lymphedema, not elsewhere classified: Secondary | ICD-10-CM

## 2021-10-22 DIAGNOSIS — I739 Peripheral vascular disease, unspecified: Secondary | ICD-10-CM

## 2021-10-23 ENCOUNTER — Encounter (INDEPENDENT_AMBULATORY_CARE_PROVIDER_SITE_OTHER): Payer: Self-pay | Admitting: Nurse Practitioner

## 2021-10-23 NOTE — Progress Notes (Signed)
Subjective:    Patient ID: Amy Tran, female    DOB: [birth year], 66 y.o.   MRN: [id number] No chief complaint on file.   Amy Tran is a 66 year old female who returns today for follow-up evaluation of her ABIs as well as her lymphedema.  The patient obtained a lymph pump since her last office visit however it broke approximately 2 weeks after having it.  She had to have everything exchanged by the company.  She just recently had a return to forgetting these again.  She notes that her legs have been feeling heavy but she denies classic claudication-like symptoms.  Currently there are no open wounds or ulcerations.  Despite not having her lymphedema pump being lower extremity edema is somewhat controlled.  Today noninvasive studies show a right ABI of 1.06 with a left 1.03.  Previous ABI is 1.07 with a left of 1.1.  Patient has biphasic/triphasic waveforms in the right with monophasic/biphasic on the left.  Slightly dampened toe waveforms bilaterally.    Review of Systems  Cardiovascular:  Positive for leg swelling.  All other systems reviewed and are negative.      Objective:   Physical Exam Vitals reviewed.  HENT:     Head: Normocephalic.  Cardiovascular:     Rate and Rhythm: Normal rate.     Pulses:          Dorsalis pedis pulses are detected w/ Doppler on the right side and detected w/ Doppler on the left side.       Posterior tibial pulses are detected w/ Doppler on the right side and detected w/ Doppler on the left side.  Pulmonary:     Effort: Pulmonary effort is normal.  Skin:    General: Skin is warm and dry.  Neurological:     Mental Status: She is alert and oriented to person, place, and time.  Psychiatric:        Mood and Affect: Mood normal.        Behavior: Behavior normal.        Thought Content: Thought content normal.        Judgment: Judgment normal.     BP (!) 154/76 (BP Location: Left Arm)   Pulse (!) 51   Resp 18   Ht '5\' 4"'$  (1.626 m)    Wt 269 lb 12.8 oz (122.4 kg)   BMI 46.31 kg/m   Past Medical History:  Diagnosis Date   Arthritis    Asthma    Cancer ([place])    multiple skin cancers   Diabetes mellitus without complication ([place])    Diverticulosis    Fluid retention    GERD (gastroesophageal reflux disease)    High cholesterol    Hypertension    Incomplete rotator cuff tear    Myocardial infarction (HCC)    Obesity    Persistent atrial fibrillation (HCC)    PONV (postoperative nausea and vomiting)    Wears glasses    Wears partial dentures    bottom    Social History   Socioeconomic History   Marital status: Widowed    Spouse name: Not on file   Number of children: 2   Years of education: Not on file   Highest education level: Not on file  Occupational History   Occupation: works at [employer] store    Employer: [employer]   Tobacco Use   Smoking status: Every Day    Packs/day: 0.30    Years: 29.00    Total pack  years: 8.70    Types: Cigarettes    Start date: 04/09/1970   Smokeless tobacco: Never   Tobacco comments:    09/04/20 - states she is down to 9 ciggs / day   Vaping Use   Vaping Use: Never used  Substance and Sexual Activity   Alcohol use: Not Currently   Drug use: No   Sexual activity: Not Currently    Birth control/protection: Post-menopausal  Other Topics Concern   Not on file  Social History Narrative   Not on file   Social Determinants of Health   Financial Resource Strain: Not on file  Food Insecurity: Not on file  Transportation Needs: Not on file  Physical Activity: Unknown (09/15/2017)   Exercise Vital Sign    Days of Exercise per Week: Patient refused    Minutes of Exercise per Session: Patient refused  Stress: Unknown (09/15/2017)   [employer] [place] of Stress : Patient refused  Social Connections: Unknown (09/15/2017)   Social Connection and Isolation Panel [NHANES]    Frequency of Communication with Friends  and Family: Patient refused    Frequency of Social Gatherings with Friends and Family: Patient refused    Attends Religious Services: Patient refused    Active Member of Clubs or Organizations: Patient refused    Attends Archivist Meetings: Patient refused    Marital Status: Patient refused  Intimate Partner Violence: Unknown (09/15/2017)   Humiliation, Afraid, Rape, and Kick questionnaire    Fear of Current or Ex-Partner: Patient refused    Emotionally Abused: Patient refused    Physically Abused: Patient refused    Sexually Abused: Patient refused    Past Surgical History:  Procedure Laterality Date   APPENDECTOMY     CARDIOVERSION N/A 06/03/2019   Procedure: CARDIOVERSION;  Surgeon: Herminio Commons, MD;  Location: [facility];  Service: Cardiovascular;  Laterality: N/A;   CARDIOVERSION N/A 07/13/2019   Procedure: CARDIOVERSION;  Surgeon: Geralynn Rile, MD;  Location: [facility];  Service: Cardiovascular;  Laterality: N/A;   CARPAL TUNNEL RELEASE Bilateral 2001   bilateral   CERVICAL POLYPECTOMY  04/08/2017   Procedure: POLYPECTOMY;  Surgeon: [name], MD;  Location: [facility];  Service: Gynecology;;  Endometrial   CHOLECYSTECTOMY     COLONOSCOPY  2007   DGL:OVFIEPP internal hemorrhoids. Diminutive rectal polyp at 10 cm, cold biopsied/removed. The remainder of the rectal mucosa appeared normal Swallow left-sided diverticula. Diminutive polyp at the splenic flexure cold biopsied/removed (adenomatous)   COLONOSCOPY N/A 12/28/2013   Procedure: COLONOSCOPY;  Surgeon: Daneil Dolin, MD;  [facility];  Service: Endoscopy;  Laterality: N/A;  730 - moved to 8:30 - Ginger notified pt   CORONARY STENT INTERVENTION N/A 04/27/2019   Procedure: CORONARY STENT INTERVENTION;  Surgeon: Troy Sine, MD;  Location: [facility];  Service: Cardiovascular;  Laterality: N/A;   DILATION AND CURETTAGE, DIAGNOSTIC / THERAPEUTIC  03/07/2017   ERCP  2002   GASTRIC  BYPASS  2004   HYSTEROSCOPY WITH D & C N/A 04/08/2017   Procedure: DILATATION AND CURETTAGE /HYSTEROSCOPY;  Surgeon: [name], MD;  Location: [facility];  Service: Gynecology;  Laterality: N/A;   INTRAVASCULAR ULTRASOUND/IVUS N/A 09/12/2017   Procedure: INTRAVASCULAR ULTRASOUND/IVUS;  Surgeon: Elam Dutch, MD;  Location: [facility];  Service: Cardiovascular;  Laterality: N/A;   LEFT HEART CATH AND CORONARY ANGIOGRAPHY N/A 04/27/2019   Procedure: LEFT HEART  CATH AND CORONARY ANGIOGRAPHY;  Surgeon: Troy Sine, MD;  Location: [facility];  Service: Cardiovascular;  Laterality: N/A;   LOWER EXTREMITY ANGIOGRAPHY Left 10/02/2020   Procedure: LOWER EXTREMITY ANGIOGRAPHY;  Surgeon: Algernon Huxley, MD;  Location: [facility];  Service: Cardiovascular;  Laterality: Left;   NECK SURGERY  1998   fusion   PERCUTANEOUS VENOUS THROMBECTOMY,LYSIS WITH INTRAVASCULAR ULTRASOUND (IVUS) Left 09/13/2017   Procedure: REMOVAL OF LYSIS CATHETER AND INTRAVASCULAR ULTRASOUND (IVUS) LEFT ILIAC VEIN;  Surgeon: Elam Dutch, MD;  Location: [facility];  Service: Vascular;  Laterality: Left;   PERIPHERAL VASCULAR INTERVENTION  09/15/2017   Procedure: PERIPHERAL VASCULAR INTERVENTION;  Surgeon: Waynetta Sandy, MD;  Location: [facility];  Service: Cardiovascular;;  VEINOUS/STENT   PERIPHERAL VASCULAR THROMBECTOMY Left 09/12/2017   Procedure: PERIPHERAL VASCULAR THROMBECTOMY;  Surgeon: Elam Dutch, MD;  Location: [facility];  Service: Cardiovascular;  Laterality: Left;   PERIPHERAL VASCULAR THROMBECTOMY Left 09/15/2017   Procedure: PERIPHERAL VASCULAR THROMBECTOMY;  Surgeon: Waynetta Sandy, MD;  Location: [facility];  Service: Cardiovascular;  Laterality: Left;  IVC TO LT FEM/POP VEIN   SHOULDER ARTHROSCOPY WITH SUBACROMIAL DECOMPRESSION Left 06/23/2013   Procedure: LEFT SHOULDER ARTHROSCOPY WITH DEBRIDEMENT ROTATOR CUFF AND LABRUM;  Surgeon: Lorn Junes, MD;  Location: [facility];  Service: Orthopedics;  Laterality: Left;   SHOULDER SURGERY  1999   left    Family History  Problem Relation Age of Onset   Diabetes Father    Hypertension Father    Parkinson's disease Father    Cancer Father        not sure what kind   Hypertension Mother    Heart disease Mother    Drug abuse Sister    Mesothelioma Brother    Cancer Daughter        non-hodgkins lmphoma   Hypertension Brother    Diabetes Brother    Colon cancer Neg Hx    Gastric cancer Neg Hx    Esophageal cancer Neg Hx     Allergies  Allergen Reactions   Penicillins Hives and Rash    Has patient had a PCN reaction causing immediate rash, facial/tongue/throat swelling, SOB or lightheadedness with hypotension: Yes Has patient had a PCN reaction causing severe rash involving mucus membranes or skin necrosis: No Has patient had a PCN reaction that required hospitalization No Has patient had a PCN reaction occurring within the last 10 years: No If all of the above answers are "NO", then may proceed with Cephalosporin use.    Doxycycline Itching       Latest Ref Rng & Units 08/29/2021    5:23 AM 08/28/2021    5:43 AM 08/27/2021    3:50 AM  CBC  WBC 4.0 - 10.5 K/uL 8.7  12.6  25.6   Hemoglobin 12.0 - 15.0 g/dL 12.6  12.1  13.4   Hematocrit 36.0 - 46.0 % 36.4  36.0  40.9   Platelets 150 - 400 K/uL 175  180  212       CMP     Component Value Date/Time   NA 135 08/29/2021 0523   NA 137 11/03/2019 1123   K 4.3 08/29/2021 0523   CL 108 08/29/2021 0523   CO2 22 08/29/2021 0523   GLUCOSE 137 (H) 08/29/2021 0523   BUN 11 08/29/2021 0523   BUN 9 11/03/2019 1123   CREATININE 0.63 08/29/2021 0523   CALCIUM 9.1  08/29/2021 0523   PROT 6.0 (L) 08/27/2021 0350   ALBUMIN 3.0 (L) 08/27/2021 0350   AST 20 08/27/2021 0350   ALT 16 08/27/2021 0350   ALKPHOS 54 08/27/2021 0350   BILITOT 1.5 (H) 08/27/2021 0350   GFRNONAA >60 08/29/2021 0523   GFRAA 108  11/03/2019 1123     No results found.     Assessment & Plan:   1. Peripheral arterial disease with history of revascularization (HCC)  Recommend:  The patient has evidence of atherosclerosis of the lower extremities with claudication.  The patient does not voice lifestyle limiting changes at this point in time.  Noninvasive studies do not suggest clinically significant change.  No invasive studies, angiography or surgery at this time The patient should continue walking and begin a more formal exercise program.    No changes in the patient's medications at this time  Continued surveillance is indicated as atherosclerosis is likely to progress with time.    The patient will continue follow up with noninvasive studies as ordered.    2. Lymphedema The patient has not had a chance to utilize her lymphedema pump due to not being functional.  However she has just received back at home.  She is advised to continue to utilize it on a daily basis.  She should also strive to continue to elevate her extremities when not active.  We will follow-up with patient's of edema in a year or sooner if issues arise.  3. Diabetes mellitus without complication ([place]) Continue hypoglycemic medications as already ordered, these medications have been reviewed and there are no changes at this time.  Hgb A1C to be monitored as already arranged by primary service    Current Outpatient Medications on File Prior to Visit  Medication Sig Dispense Refill   acetaminophen (TYLENOL) 500 MG tablet Take 500 mg by mouth every 6 (six) hours as needed for mild pain or headache.     albuterol (VENTOLIN HFA) 108 (90 Base) MCG/ACT inhaler Inhale 2 puffs into the lungs every 6 (six) hours as needed for wheezing or shortness of breath.     alendronate (FOSAMAX) 70 MG tablet Take 70 mg by mouth once a week.     atorvastatin (LIPITOR) 80 MG tablet Take 1 tablet (80 mg total) by mouth daily. 90 tablet 2   BD PEN NEEDLE NANO  U/F 32G X 4 MM MISC USE FOR VICTOZA INJECTIONS     clopidogrel (PLAVIX) 75 MG tablet TAKE 1 TABLET (75 MG TOTAL) BY MOUTH DAILY WITH BREAKFAST. 90 tablet 3   dofetilide (TIKOSYN) 250 MCG capsule Take 1 capsule (250 mcg total) by mouth 2 (two) times daily. 180 capsule 1   fluticasone (FLONASE) 50 MCG/ACT nasal spray Place 2 sprays into both nostrils daily as needed for rhinitis (congestion/ dryness).     JARDIANCE 10 MG TABS tablet Take 10 mg by mouth daily.     Lancets (ONETOUCH DELICA PLUS [id number]) MISC 1 each daily.     losartan (COZAAR) 100 MG tablet Take 100 mg by mouth daily with breakfast.      nitroGLYCERIN (NITROSTAT) 0.4 MG SL tablet Place 1 tablet (0.4 mg total) under the tongue every 5 (five) minutes as needed for chest pain. 30 tablet 0   potassium chloride SA (KLOR-CON) 20 MEQ tablet Take 1 tablet (20 mEq total) by mouth 2 (two) times daily.     rivaroxaban (XARELTO) 20 MG TABS tablet Take 1 tablet (20 mg total) by mouth daily with supper. [place]  tablet 6   Skin Protectants, Misc. (EUCERIN) cream Apply 1 Application topically daily.     spironolactone (ALDACTONE) 25 MG tablet Take 25 mg by mouth daily with breakfast.      torsemide (DEMADEX) 20 MG tablet Take 60 mg by mouth daily after breakfast.      VICTOZA 18 MG/3ML SOPN Inject 1.8 mg into the skin at bedtime.      VITAMIN D PO Take 2,000 Units by mouth daily.     cetirizine (ZYRTEC) 10 MG tablet Take 10 mg by mouth at bedtime as needed for allergies. (Patient not taking: Reported on 10/22/2021)     No current facility-administered medications on file prior to visit.    There are no Patient Instructions on file for this visit. No follow-ups on file.   Kris Hartmann, NP

## 2021-11-13 DIAGNOSIS — M47816 Spondylosis without myelopathy or radiculopathy, lumbar region: Secondary | ICD-10-CM | POA: Diagnosis not present

## 2021-11-23 DIAGNOSIS — M47816 Spondylosis without myelopathy or radiculopathy, lumbar region: Secondary | ICD-10-CM | POA: Diagnosis not present

## 2021-12-01 ENCOUNTER — Other Ambulatory Visit: Payer: Self-pay | Admitting: Physician Assistant

## 2021-12-04 ENCOUNTER — Other Ambulatory Visit: Payer: Self-pay

## 2021-12-04 ENCOUNTER — Encounter (HOSPITAL_COMMUNITY): Payer: Self-pay | Admitting: *Deleted

## 2021-12-04 ENCOUNTER — Emergency Department (HOSPITAL_COMMUNITY)
Admission: EM | Admit: 2021-12-04 | Discharge: 2021-12-04 | Disposition: A | Discharge status: Home / Self Care | Payer: Medicare Other | Attending: Emergency Medicine | Admitting: Emergency Medicine

## 2021-12-04 DIAGNOSIS — M79661 Pain in right lower leg: Secondary | ICD-10-CM | POA: Diagnosis present

## 2021-12-04 DIAGNOSIS — M5431 Sciatica, right side: Secondary | ICD-10-CM | POA: Insufficient documentation

## 2021-12-04 DIAGNOSIS — Z7901 Long term (current) use of anticoagulants: Secondary | ICD-10-CM | POA: Diagnosis not present

## 2021-12-04 DIAGNOSIS — M5441 Lumbago with sciatica, right side: Secondary | ICD-10-CM | POA: Diagnosis not present

## 2021-12-04 HISTORY — DX: Age-related osteoporosis without current pathological fracture: M81.0

## 2021-12-04 MED ORDER — HYDROCODONE-ACETAMINOPHEN 5-325 MG PO TABS
1.0000 | ORAL_TABLET | Freq: Once | ORAL | Status: AC
  Administered 2021-12-04: 1 via ORAL
  Filled 2021-12-04: qty 1

## 2021-12-04 MED ORDER — HYDROCODONE-ACETAMINOPHEN 5-325 MG PO TABS: 1.0000 | ORAL_TABLET | Freq: Four times a day (QID) | ORAL | 0 refills | Status: DC | PRN

## 2021-12-04 NOTE — Discharge Instructions (Addendum)
Do not drive within 4 hours of taking hydrocodone as this will make you drowsy.  Avoid lifting,  Bending,  Twisting or any other activity that worsens your pain over the next week.  Apply a heating pad to your lower back for 20 minutes 3-4 times daily.

## 2021-12-04 NOTE — ED Triage Notes (Signed)
Pt c/o pain that starts in her right buttocks and radiates down into her right leg since yesterday, but worsening this morning. Pt reports she has hx of sciatica and this feels similar.

## 2021-12-04 NOTE — ED Provider Notes (Signed)
[facility] EMERGENCY DEPARTMENT Provider Note   CSN: [id number] Arrival date & time: 12/04/21  2248     History  Chief Complaint  Patient presents with   Leg Pain    Amy Tran is a 66 y.o. 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  There is radiation of pain into the right lower extremity to just beyond her knee, consistent with prior episodes of sciatica.  There has been no weakness or numbness in the lower extremities and no urinary or bowel retention or incontinence.  Patient does not have a history of cancer or IVDU.  The patient has been under the care of Dr. Ron Agee with [facility] for this condition, reports having some injections about 3 weeks ago to try to numb the nerve which seemed to be improving until yesterday.  Her next appointment with him is in 2 days.   She has taken tylenol without relief.    The history is provided by the patient.       Home Medications Prior to Admission medications   Medication Sig Start Date End Date Taking? Authorizing Provider  HYDROcodone-acetaminophen (NORCO/VICODIN) 5-325 MG tablet Take 1 tablet by mouth every 6 (six) hours as needed. 12/04/21  Yes Tashanda Fuhrer, Almyra Free, PA-C  acetaminophen (TYLENOL) 500 MG tablet Take 500 mg by mouth every 6 (six) hours as needed for mild pain or headache.    [provider]  albuterol (VENTOLIN HFA) 108 (90 Base) MCG/ACT inhaler Inhale 2 puffs into the lungs every 6 (six) hours as needed for wheezing or shortness of breath.    [provider]  alendronate (FOSAMAX) 70 MG tablet Take 70 mg by mouth once a week. 09/28/21   [provider]  atorvastatin (LIPITOR) 80 MG tablet Take 1 tablet (80 mg total) by mouth daily. 10/19/21   Arnoldo Lenis, MD  BD PEN NEEDLE NANO U/F 32G X 4 MM MISC USE FOR [place] INJECTIONS 08/03/18   [provider]  cetirizine  (ZYRTEC) 10 MG tablet Take 10 mg by mouth at bedtime as needed for allergies. Patient not taking: Reported on 10/22/2021    [provider]  clopidogrel (PLAVIX) 75 MG tablet TAKE 1 TABLET BY MOUTH DAILY WITH BREAKFAST. 12/03/21   Arnoldo Lenis, MD  dofetilide (TIKOSYN) 250 MCG capsule Take 1 capsule (250 mcg total) by mouth 2 (two) times daily. 10/19/21   Arnoldo Lenis, MD  fluticasone (FLONASE) 50 MCG/ACT nasal spray Place 2 sprays into both nostrils daily as needed for rhinitis (congestion/ dryness). 06/29/18   [provider]  JARDIANCE 10 MG TABS tablet Take 10 mg by mouth daily. 02/11/21   [provider]  Lancets [facility] DELICA PLUS [id number]) [place] 1 each daily. 09/03/21   [provider]  losartan (COZAAR) 100 MG tablet Take 100 mg by mouth daily with breakfast.     [provider]  nitroGLYCERIN (NITROSTAT) 0.4 MG SL tablet Place 1 tablet (0.4 mg total) under the tongue every 5 (five) minutes as needed for chest pain. 04/28/19   Florencia Reasons, MD  potassium chloride SA (KLOR-CON) 20 MEQ tablet Take 1 tablet (20 mEq total) by mouth 2 (two) times daily. 09/23/19   Shirley Friar, PA-C  rivaroxaban (XARELTO) 20 MG TABS tablet Take 1 tablet (20 mg total) by mouth daily with supper. 09/06/21   Arnoldo Lenis, MD  Skin Protectants, Misc. (EUCERIN) cream Apply 1 Application topically daily.    [provider]  spironolactone (ALDACTONE) 25 MG tablet Take 25 mg by mouth daily with breakfast.     [provider]  torsemide (DEMADEX) 20 MG tablet Take 60 mg by mouth daily after breakfast.     [provider]  VICTOZA 18 MG/3ML SOPN Inject 1.8 mg into the skin at bedtime.  08/04/18   [provider]  VITAMIN D PO Take 2,000 Units by mouth daily.    [provider]      Allergies    Penicillins and Doxycycline    Review of Systems   Review of Systems  Constitutional:  Negative for fever.   Respiratory:  Negative for shortness of breath.   Cardiovascular:  Negative for chest pain and leg swelling.  Gastrointestinal:  Negative for abdominal distention, abdominal pain and constipation.  Genitourinary:  Negative for difficulty urinating, dysuria, flank pain, frequency and urgency.  Musculoskeletal:  Positive for back pain. Negative for gait problem and joint swelling.  Skin:  Negative for rash.  Neurological:  Negative for weakness and numbness.  All other systems reviewed and are negative.   Physical Exam Updated Vital Signs BP (!) 164/62   Pulse (!) 50   Temp 97.9 F (36.6 C)   Resp 18   Ht '5\' 4"'$  (1.626 m)   Wt 124.3 kg   SpO2 94%   BMI 47.03 kg/m  Physical Exam Vitals and nursing note reviewed.  Constitutional:      Appearance: She is well-developed.  HENT:     Head: Normocephalic.  Eyes:     Conjunctiva/sclera: Conjunctivae normal.  Cardiovascular:     Rate and Rhythm: Normal rate.     Pulses: Normal pulses.     Comments: Pedal pulses normal. Pulmonary:     Effort: Pulmonary effort is normal.  Abdominal:     General: Bowel sounds are normal. There is no distension.     Palpations: Abdomen is soft. There is no mass.  Musculoskeletal:        General: Normal range of motion.     Cervical back: Normal range of motion and neck supple.     Lumbar back: Tenderness present. No swelling, edema or spasms. Negative right straight leg raise test and negative left straight leg raise test.     Comments: Ttp right SI joint, no deformity, erythema or edema.  No midline lumbar pain.    Skin:    General: Skin is warm and dry.     Comments: Chronic skin changes of vascular disease bilateral lower legs.    Neurological:     Mental Status: She is alert.     Sensory: No sensory deficit.     Motor: No tremor or atrophy.     Gait: Gait normal.     Deep Tendon Reflexes:     Reflex Scores:      Patellar reflexes are 2+ on the right side and 2+ on the left side.     Comments: No strength deficit noted in hip and knee flexor and extensor muscle groups.  Ankle flexion and extension intact without numbness or foot drop.     ED Results / Procedures / Treatments   Labs (all labs ordered are listed, but only abnormal results are displayed) Labs Reviewed - No data to display  EKG None  Radiology No results found.  Procedures Procedures    Medications Ordered in ED Medications  HYDROcodone-acetaminophen (NORCO/VICODIN) 5-325  MG per tablet 1 tablet (1 tablet Oral Given 12/04/21 1003)    ED Course/ Medical Decision Making/ A&P                           Medical Decision Making Risk Prescription drug management.   No neuro deficit on exam or by history to suggest emergent or surgical presentation.  Patient is afebrile, history does not suggest discitis or epidural abscess.  Discussed worsened sx that should prompt immediate re-evaluation including distal weakness, bowel/bladder retention/incontinence.  Pain medicine prescribed.  Plan f/u with her back specialist in 2 days as is already scheduled.             Final Clinical Impression(s) / ED Diagnoses Final diagnoses:  Sciatica of right side    Rx / DC Orders ED Discharge Orders          Ordered    HYDROcodone-acetaminophen (NORCO/VICODIN) 5-325 MG tablet  Every 6 hours PRN        12/04/21 1021              Evalee Jefferson, PA-C 12/04/21 1144    Fransico Meadow, MD 12/04/21 2128

## 2021-12-06 DIAGNOSIS — M5416 Radiculopathy, lumbar region: Secondary | ICD-10-CM | POA: Diagnosis not present

## 2021-12-10 ENCOUNTER — Encounter (INDEPENDENT_AMBULATORY_CARE_PROVIDER_SITE_OTHER): Payer: Self-pay

## 2021-12-12 ENCOUNTER — Telehealth: Payer: Self-pay

## 2021-12-12 NOTE — Telephone Encounter (Signed)
     Patient  visit on 10/24  at [facility] you been able to follow up with your primary care physician?YES  The patient was or was not able to obtain any needed medicine or equipment. YES  Are there diet recommendations that you are having difficulty following? NA  Patient expresses understanding of discharge instructions and education provided has no other needs at this time.  [place], [facility], Care Management  [phone] [address], [place], [place] [address] Phone: [phone] Email: [email or web address]

## 2021-12-24 ENCOUNTER — Other Ambulatory Visit: Payer: Self-pay | Admitting: *Deleted

## 2021-12-24 MED ORDER — ATORVASTATIN CALCIUM 80 MG PO TABS: 80.0000 mg | ORAL_TABLET | Freq: Every day | ORAL | 3 refills | Status: DC

## 2021-12-24 MED ORDER — RIVAROXABAN 20 MG PO TABS: 20.0000 mg | ORAL_TABLET | Freq: Every day | ORAL | 1 refills | Status: DC

## 2021-12-24 MED ORDER — CLOPIDOGREL BISULFATE 75 MG PO TABS: 75.0000 mg | ORAL_TABLET | Freq: Every day | ORAL | 3 refills | Status: DC

## 2021-12-24 MED ORDER — DOFETILIDE 250 MCG PO CAPS: 250.0000 ug | ORAL_CAPSULE | Freq: Two times a day (BID) | ORAL | 3 refills | Status: DC

## 2021-12-24 NOTE — Telephone Encounter (Signed)
Prescription refill request for Xarelto received.  Indication: AF Last office visit: 09/06/21  Zandra Abts MD Weight: 119.1kg Age: 66 Scr: 0.63 on 08/29/21 CrCl: 165.15  Based on above findings Xarelto '20mg'$  daily is the appropriate dose  Refill approved.

## 2021-12-25 ENCOUNTER — Telehealth: Payer: Self-pay | Admitting: *Deleted

## 2021-12-25 DIAGNOSIS — M5416 Radiculopathy, lumbar region: Secondary | ICD-10-CM | POA: Diagnosis not present

## 2021-12-25 NOTE — Telephone Encounter (Addendum)
   Pre-operative Risk Assessment    Patient Name: Amy Tran  DOB: [birth year] MRN: [id number]      Request for Surgical Clearance    Procedure:   Lumbar transforaminal right L4-L5  Date of Surgery:  Clearance TBD                                 Surgeon:  Dr. Lenord Carbo Surgeon's Group or Practice Name:  [facility] Phone number:  [phone] Fax number:  [phone]   Type of Clearance Requested:   - Pharmacy:  Hold Rivaroxaban (Xarelto) 3 days prior. Can resume the day after   Type of Anesthesia:  MAC   Additional requests/questions:  STAT  [name]   12/25/2021, 4:51 PM

## 2021-12-26 ENCOUNTER — Telehealth: Payer: Self-pay | Admitting: *Deleted

## 2021-12-26 NOTE — Telephone Encounter (Signed)
Patient with diagnosis of atrial fibrillation on Xarelto 20 mg for anticoagulation.    Procedure: Lumbar transforaminal right L4-L5  Date of procedure: 01/11/2022   CHA2DS2-VASc Score = 5  This indicates a 7.2% annual risk of stroke. The patient's score is based upon: CHF History: 0 HTN History: 1 Diabetes History: 1 Stroke History: 0 Vascular Disease History: 1 Age Score: 1 Gender Score: 1   CrCl: ~113 ml/min (Cr 0.63 08/29/2021, Adj. BW) Platelet count 175  Per office protocol, patient can hold Xarelto 20 mg for 3 days prior to procedure.    **This guidance is not considered finalized until pre-operative APP has relayed final recommendations.**   Sandford Craze, PharmD. [facility] Acute Care PGY-1 12/26/2021 12:14 PM

## 2021-12-26 NOTE — Telephone Encounter (Signed)
Pt agreeable to plan of care for tele pre op appt 01/02/22 @ 2 pm. Med rec and consent are done.     Patient Consent for Virtual Visit        [name] has provided verbal consent on 12/26/2021 for a virtual visit (video or telephone).   CONSENT FOR VIRTUAL VISIT FOR:  Amy Tran  By participating in this virtual visit I agree to the following:  I hereby voluntarily request, consent and authorize [facility] and its employed or contracted physicians, physician assistants, nurse practitioners or other licensed health care professionals (the Practitioner), to provide me with telemedicine health care services (the "Services") as deemed necessary by the treating Practitioner. I acknowledge and consent to receive the Services by the Practitioner via telemedicine. I understand that the telemedicine visit will involve communicating with the Practitioner through live audiovisual communication technology and the disclosure of certain medical information by electronic transmission. I acknowledge that I have been given the opportunity to request an in-person assessment or other available alternative prior to the telemedicine visit and am voluntarily participating in the telemedicine visit.  I understand that I have the right to withhold or withdraw my consent to the use of telemedicine in the course of my care at any time, without affecting my right to future care or treatment, and that the Practitioner or I may terminate the telemedicine visit at any time. I understand that I have the right to inspect all information obtained and/or recorded in the course of the telemedicine visit and may receive copies of available information for a reasonable fee.  I understand that some of the potential risks of receiving the Services via telemedicine include:  Delay or interruption in medical evaluation due to technological equipment failure or disruption; Information transmitted may not be  sufficient (e.g. poor resolution of images) to allow for appropriate medical decision making by the Practitioner; and/or  In rare instances, security protocols could fail, causing a breach of personal health information.  Furthermore, I acknowledge that it is my responsibility to provide information about my medical history, conditions and care that is complete and accurate to the best of my ability. I acknowledge that Practitioner's advice, recommendations, and/or decision may be based on factors not within their control, such as incomplete or inaccurate data provided by me or distortions of diagnostic images or specimens that may result from electronic transmissions. I understand that the practice of medicine is not an exact science and that Practitioner makes no warranties or guarantees regarding treatment outcomes. I acknowledge that a copy of this consent can be made available to me via my patient portal ([facility]), or I can request a printed copy by calling the office of [facility].    I understand that my insurance will be billed for this visit.   I have read or had this consent read to me. I understand the contents of this consent, which adequately explains the benefits and risks of the Services being provided via telemedicine.  I have been provided ample opportunity to ask questions regarding this consent and the Services and have had my questions answered to my satisfaction. I give my informed consent for the services to be provided through the use of telemedicine in my medical care

## 2021-12-26 NOTE — Telephone Encounter (Signed)
   Name: Amy Tran  DOB: [birth year]  MRN: [id number]  Primary Cardiologist: Carlyle Dolly, MD  Chart reviewed as part of pre-operative protocol coverage. Because of Amy Tran's past medical history and time since last visit, she will require a follow-up telephone visit in order to better assess preoperative cardiovascular risk.  Pre-op covering staff: - Please schedule appointment and call patient to inform them. If patient already had an upcoming appointment within acceptable timeframe, please add "pre-op clearance" to the appointment notes so provider is aware. - Please contact requesting surgeon's office via preferred method (i.e, phone, fax) to inform them of need for appointment prior to surgery.  See below for anticoagulation recommendations.   Elgie Collard, PA-C  12/26/2021, 1:42 PM

## 2021-12-26 NOTE — Telephone Encounter (Signed)
Pt agreeable to plan of care for tele pre op appt 01/02/22 @ 2 pm. Med rec and consent are done.

## 2022-01-02 ENCOUNTER — Ambulatory Visit: Payer: Medicare Other | Attending: Cardiology | Admitting: Student

## 2022-01-02 DIAGNOSIS — Z01810 Encounter for preprocedural cardiovascular examination: Secondary | ICD-10-CM | POA: Diagnosis not present

## 2022-01-02 NOTE — Progress Notes (Signed)
Virtual Visit via Telephone Note   Because of Sarafina Puthoff Tran's co-morbid illnesses, she is at least at moderate risk for complications without adequate follow up.  This format is felt to be most appropriate for this patient at this time.  The patient did not have access to video technology/had technical difficulties with video requiring transitioning to audio format only (telephone).  All issues noted in this document were discussed and addressed.  No physical exam could be performed with this format.  Please refer to the patient's chart for her consent to telehealth for [facility].  Evaluation Performed:  Preoperative Cardiovascular Risk Assessment _____________   Date:  01/02/2022   Patient ID:  Amy Tran, DOB [birth year], MRN [id number] Patient Location:  Home Provider location:   Office  Primary Care Provider:  Asencion Noble, MD Primary Cardiologist:  Carlyle Dolly, MD  Chief Complaint / Patient Profile   Amy Tran is a 66 y.o. year old female with a history of CAD with NSTEMI s/p DES to RCA in 04/2019, persistent atrial fibrillation on Tikosyn and Xarelto, aortic stenosis, PAD s/p prior intervention of left lower extremity, hypertension, hyperlipidemia, type 2 diabetes mellitus, who is pending lumbar transforaminal right L4-L5 and presents today for telephonic preoperative cardiovascular risk assessment.  Past Medical History    Past Medical History:  Diagnosis Date   Arthritis    Asthma    Cancer ([place])    multiple skin cancers   Diabetes mellitus without complication ([place])    Diverticulosis    Fluid retention    GERD (gastroesophageal reflux disease)    High cholesterol    Hypertension    Incomplete rotator cuff tear    Myocardial infarction ([place])    Obesity    Osteoporosis    Persistent atrial fibrillation ([facility])    PONV (postoperative nausea and vomiting)    Wears glasses    Wears partial dentures    bottom   Past Surgical History:   Procedure Laterality Date   APPENDECTOMY     CARDIOVERSION N/A 06/03/2019   Procedure: CARDIOVERSION;  Surgeon: Herminio Commons, MD;  Location: [facility];  Service: Cardiovascular;  Laterality: N/A;   CARDIOVERSION N/A 07/13/2019   Procedure: CARDIOVERSION;  Surgeon: Geralynn Rile, MD;  Location: [facility];  Service: Cardiovascular;  Laterality: N/A;   CARPAL TUNNEL RELEASE Bilateral 2001   bilateral   CERVICAL POLYPECTOMY  04/08/2017   Procedure: POLYPECTOMY;  Surgeon: [name], MD;  Location: [facility];  Service: Gynecology;;  Endometrial   CHOLECYSTECTOMY     COLONOSCOPY  2007   [id number] internal hemorrhoids. Diminutive rectal polyp at 10 cm, cold biopsied/removed. The remainder of the rectal mucosa appeared normal Swallow left-sided diverticula. Diminutive polyp at the splenic flexure cold biopsied/removed (adenomatous)   COLONOSCOPY N/A 12/28/2013   Procedure: COLONOSCOPY;  Surgeon: Daneil Dolin, MD;  [facility];  Service: Endoscopy;  Laterality: N/A;  730 - moved to 8:30 - Ginger notified pt   CORONARY STENT INTERVENTION N/A 04/27/2019   Procedure: CORONARY STENT INTERVENTION;  Surgeon: Troy Sine, MD;  Location: [facility];  Service: Cardiovascular;  Laterality: N/A;   DILATION AND CURETTAGE, DIAGNOSTIC / THERAPEUTIC  03/07/2017   ERCP  2002   GASTRIC BYPASS  2004   HYSTEROSCOPY WITH D & C N/A 04/08/2017   Procedure: DILATATION AND CURETTAGE /HYSTEROSCOPY;  Surgeon: [name], MD;  Location: [facility];  Service: Gynecology;  Laterality: N/A;   INTRAVASCULAR  ULTRASOUND/IVUS N/A 09/12/2017   Procedure: INTRAVASCULAR ULTRASOUND/IVUS;  Surgeon: Elam Dutch, MD;  Location: [facility];  Service: Cardiovascular;  Laterality: N/A;   LEFT HEART CATH AND CORONARY ANGIOGRAPHY N/A 04/27/2019   Procedure: LEFT HEART CATH AND CORONARY ANGIOGRAPHY;  Surgeon: Troy Sine, MD;  Location: [facility];  Service: Cardiovascular;   Laterality: N/A;   LOWER EXTREMITY ANGIOGRAPHY Left 10/02/2020   Procedure: LOWER EXTREMITY ANGIOGRAPHY;  Surgeon: Algernon Huxley, MD;  Location: [facility];  Service: Cardiovascular;  Laterality: Left;   NECK SURGERY  1998   fusion   PERCUTANEOUS VENOUS THROMBECTOMY,LYSIS WITH INTRAVASCULAR ULTRASOUND (IVUS) Left 09/13/2017   Procedure: REMOVAL OF LYSIS CATHETER AND INTRAVASCULAR ULTRASOUND (IVUS) LEFT ILIAC VEIN;  Surgeon: Elam Dutch, MD;  Location: [facility];  Service: Vascular;  Laterality: Left;   PERIPHERAL VASCULAR INTERVENTION  09/15/2017   Procedure: PERIPHERAL VASCULAR INTERVENTION;  Surgeon: Waynetta Sandy, MD;  Location: [facility];  Service: Cardiovascular;;  VEINOUS/STENT   PERIPHERAL VASCULAR THROMBECTOMY Left 09/12/2017   Procedure: PERIPHERAL VASCULAR THROMBECTOMY;  Surgeon: Elam Dutch, MD;  Location: [facility];  Service: Cardiovascular;  Laterality: Left;   PERIPHERAL VASCULAR THROMBECTOMY Left 09/15/2017   Procedure: PERIPHERAL VASCULAR THROMBECTOMY;  Surgeon: Waynetta Sandy, MD;  Location: [facility];  Service: Cardiovascular;  Laterality: Left;  IVC TO LT FEM/POP VEIN   SHOULDER ARTHROSCOPY WITH SUBACROMIAL DECOMPRESSION Left 06/23/2013   Procedure: LEFT SHOULDER ARTHROSCOPY WITH DEBRIDEMENT ROTATOR CUFF AND LABRUM;  Surgeon: Lorn Junes, MD;  Location: [facility];  Service: Orthopedics;  Laterality: Left;   SHOULDER SURGERY  1999   left    Allergies  Allergies  Allergen Reactions   Penicillins Hives and Rash    Has patient had a PCN reaction causing immediate rash, facial/tongue/throat swelling, SOB or lightheadedness with hypotension: Yes Has patient had a PCN reaction causing severe rash involving mucus membranes or skin necrosis: No Has patient had a PCN reaction that required hospitalization No Has patient had a PCN reaction occurring within the last 10 years: No If all of the above answers  are "NO", then may proceed with Cephalosporin use.    Doxycycline Itching    History of Present Illness    Amy Tran is a 66 y.o. female who presents via audio/video conferencing for a telehealth visit today.  Patient was last seen in our office on 09/06/2021 by Dr. Carlyle Dolly.  At that time, she was stable from a cardiac standpoint and denied any chest pain or shortness of breath. Repeat Echo was ordered for monitor of aortic stenosis and showed LVEF of 60-65% with normal wall motion, normal RV, mild MR, mild to moderate AS, and mild dilatation of the ascending aorta measuring 36 mm. She is now pending procedure as outlined above. Since her last visit, she denies any significant changes. She continues to do well from a cardiac standpoint. She denies any chest pain, shortness of breath, orthopnea, or PND. She has chronic lower extremity edema but this is stable. No palpitations, lightheadedness, dizziness, or syncope. Her activity is limited due to her sciatica/ back pain but she is still able to do everything she needs to do (she just has to do it slower due to the pain from her sciatica). She is still able to complete > 4.0 METS without any chest pain or shortness of breath.  Home Medications    Prior to Admission medications  Medication Sig Start Date End Date Taking? Authorizing Provider  acetaminophen (TYLENOL) 500 MG tablet Take 500 mg by mouth every 6 (six) hours as needed for mild pain or headache.    [provider]  albuterol (VENTOLIN HFA) 108 (90 Base) MCG/ACT inhaler Inhale 2 puffs into the lungs every 6 (six) hours as needed for wheezing or shortness of breath.    [provider]  alendronate (FOSAMAX) 70 MG tablet Take 70 mg by mouth once a week. 09/28/21   [provider]  atorvastatin (LIPITOR) 80 MG tablet Take 1 tablet (80 mg total) by mouth daily. 12/24/21   Arnoldo Lenis, MD  BD PEN NEEDLE NANO U/F 32G X 4 MM MISC USE FOR [place]  INJECTIONS 08/03/18   [provider]  cetirizine (ZYRTEC) 10 MG tablet Take 10 mg by mouth at bedtime as needed for allergies. Patient not taking: Reported on 10/22/2021    [provider]  clopidogrel (PLAVIX) 75 MG tablet Take 1 tablet (75 mg total) by mouth daily with breakfast. 12/24/21   Arnoldo Lenis, MD  dofetilide (TIKOSYN) 250 MCG capsule Take 1 capsule (250 mcg total) by mouth 2 (two) times daily. 12/24/21   Arnoldo Lenis, MD  fluticasone (FLONASE) 50 MCG/ACT nasal spray Place 2 sprays into both nostrils daily as needed for rhinitis (congestion/ dryness). 06/29/18   [provider]  HYDROcodone-acetaminophen (NORCO/VICODIN) 5-325 MG tablet Take 1 tablet by mouth every 6 (six) hours as needed. 12/04/21   Idol, Almyra Free, PA-C  JARDIANCE 10 MG TABS tablet Take 10 mg by mouth daily. 02/11/21   [provider]  Lancets [facility] DELICA PLUS [id number]) [place] 1 each daily. 09/03/21   [provider]  losartan (COZAAR) 100 MG tablet Take 100 mg by mouth daily with breakfast.     [provider]  nitroGLYCERIN (NITROSTAT) 0.4 MG SL tablet Place 1 tablet (0.4 mg total) under the tongue every 5 (five) minutes as needed for chest pain. 04/28/19   Florencia Reasons, MD  potassium chloride SA (KLOR-CON) 20 MEQ tablet Take 1 tablet (20 mEq total) by mouth 2 (two) times daily. 09/23/19   Shirley Friar, PA-C  rivaroxaban (XARELTO) 20 MG TABS tablet Take 1 tablet (20 mg total) by mouth daily with supper. 12/24/21   Arnoldo Lenis, MD  Skin Protectants, Misc. (EUCERIN) cream Apply 1 Application topically daily.    [provider]  spironolactone (ALDACTONE) 25 MG tablet Take 25 mg by mouth daily with breakfast.     [provider]  torsemide (DEMADEX) 20 MG tablet Take 60 mg by mouth daily after breakfast.     [provider]  VICTOZA 18 MG/3ML SOPN Inject 1.8 mg into the skin at bedtime.  08/04/18   [provider]   VITAMIN D PO Take 2,000 Units by mouth daily.    [provider]    Physical Exam    Vital Signs:  SHELVY HECKERT does not have vital signs available for review today.  Given telephonic nature of communication, physical exam is limited. Alert and oriented x3. No acute distress. Normal affect. Speech and respirations are unlabored.  Accessory Clinical Findings    None  Assessment & Plan    Preoperative Cardiovascular Risk Assessment: Patient has an upcoming umbar transforaminal right L4-L5 planned. She is stable from a cardiac standpoint with no angina, significant shortness of breath, acute CHF symptoms, palpitations, dizziness, syncope. Per Revised Cardiac Risk Index, considered Class II Risk with  6.0% 30-day risk of death, MI, or cardiac arrest given history of CAD with history of MI. She is well optimized from a cardiac standpoint and able to complete >4.0 METS of activity without any significant issues. Therefore, based on ACC/AHA guidelines, patient would be at acceptable risk for the planned procedure without further cardiovascular testing. Per Pharmacy and office protocol, patient can hold Xarelto for 3 day prior to procedure. Please restart this as soon as safely possible postoperatively. Patient is also on Plavix; however, per Dr. Nelly Laurence last note this is for his prior lower extremity intervention and not for a cardiac indication. Therefore, would defer to Vascular Surgery on recommendations for holding Plavix. I will route this recommendation to the requesting party via Epic fax function.    A copy of this note will be routed to requesting surgeon.  Time:   Today, I have spent 6 minutes with the patient with telehealth technology discussing medical history, symptoms, and management plan.     Darreld Mclean, PA-C  01/02/2022, 11:10 AM

## 2022-01-07 ENCOUNTER — Encounter (HOSPITAL_COMMUNITY): Payer: Self-pay | Admitting: Hematology

## 2022-01-09 NOTE — Progress Notes (Unsigned)
[place], [place] [address]   CLINIC:  Medical Oncology/Hematology  PCP:  Asencion Noble, MD [address] [place] [address] [phone]   REASON FOR VISIT:  Follow-up for iron deficiency anemia  PRIOR THERAPY: Oral iron tablets  CURRENT THERAPY: Intermittent IV iron infusions  INTERVAL HISTORY:  Ms. Durkin 66 y.o. female returns for routine follow-up of her iron deficiency anemia.  She was last seen by Tarri Abernethy PA-C on 07/04/2021.  At today's visit, she reports feeling well.  In the interim since her last visit, she was hospitalized for sepsis secondary to UTI from 08/26/2021 through 08/29/2021.  She denies any other major changes in baseline health status.  She can significant problems with back pain and sciatic nerve pain of her right leg.  Patient reports energy chronically low.  She started craving ice again about 1 month ago.  She continues to take Xarelto for her history of DVT and her A-fib.  She has not noticed any signs of blood loss such as epistaxis, bright red blood per rectum, or melena.  She has occasional headaches. She denies any restless legs, chest pain, dyspnea on exertion, lightheadedness, or syncope.  She continues to follow closely with vascular specialist due to extensive peripheral arterial and peripheral venous disease.  She has little to no energy and 80% appetite. She endorses that she is maintaining a stable weight.   REVIEW OF SYSTEMS:  Review of Systems  Constitutional:  Positive for fatigue. Negative for appetite change, chills, diaphoresis, fever and unexpected weight change.  HENT:   Negative for lump/mass and nosebleeds.   Eyes:  Negative for eye problems.  Respiratory:  Negative for cough, hemoptysis and shortness of breath.   Cardiovascular:  Positive for leg swelling. Negative for chest pain and palpitations.  Gastrointestinal:  Negative for abdominal pain, blood in stool,  constipation, diarrhea, nausea and vomiting.  Genitourinary:  Negative for hematuria.   Musculoskeletal:  Positive for arthralgias and back pain.  Skin: Negative.   Neurological:  Positive for headaches and numbness. Negative for dizziness and light-headedness.  Hematological:  Does not bruise/bleed easily.  Psychiatric/Behavioral:  Positive for sleep disturbance.       PAST MEDICAL/SURGICAL HISTORY:  Past Medical History:  Diagnosis Date   Arthritis    Asthma    Cancer ([facility])    multiple skin cancers   Diabetes mellitus without complication ([facility])    Diverticulosis    Fluid retention    GERD (gastroesophageal reflux disease)    High cholesterol    Hypertension    Incomplete rotator cuff tear    Myocardial infarction ([facility])    Obesity    Osteoporosis    Persistent atrial fibrillation ([facility])    PONV (postoperative nausea and vomiting)    Wears glasses    Wears partial dentures    bottom   Past Surgical History:  Procedure Laterality Date   APPENDECTOMY     CARDIOVERSION N/A 06/03/2019   Procedure: CARDIOVERSION;  Surgeon: Herminio Commons, MD;  Location: [facility];  Service: Cardiovascular;  Laterality: N/A;   CARDIOVERSION N/A 07/13/2019   Procedure: CARDIOVERSION;  Surgeon: Geralynn Rile, MD;  Location: [facility];  Service: Cardiovascular;  Laterality: N/A;   CARPAL TUNNEL RELEASE Bilateral 2001   bilateral   CERVICAL POLYPECTOMY  04/08/2017   Procedure: POLYPECTOMY;  Surgeon: [name], MD;  Location: [facility];  Service: Gynecology;;  Endometrial   CHOLECYSTECTOMY     COLONOSCOPY  2007   OMB:TDHRCBU internal hemorrhoids. Diminutive rectal polyp at 10 cm, cold biopsied/removed. The remainder of the rectal mucosa appeared normal Swallow left-sided diverticula. Diminutive polyp at the splenic flexure cold biopsied/removed (adenomatous)   COLONOSCOPY N/A 12/28/2013   Procedure: COLONOSCOPY;  Surgeon: Daneil Dolin, MD;  [facility];  Service:  Endoscopy;  Laterality: N/A;  730 - moved to 8:30 - Ginger notified pt   CORONARY STENT INTERVENTION N/A 04/27/2019   Procedure: CORONARY STENT INTERVENTION;  Surgeon: Troy Sine, MD;  Location: [facility];  Service: Cardiovascular;  Laterality: N/A;   DILATION AND CURETTAGE, DIAGNOSTIC / THERAPEUTIC  03/07/2017   ERCP  2002   GASTRIC BYPASS  2004   HYSTEROSCOPY WITH D & C N/A 04/08/2017   Procedure: DILATATION AND CURETTAGE /HYSTEROSCOPY;  Surgeon: [name], MD;  Location: [facility];  Service: Gynecology;  Laterality: N/A;   INTRAVASCULAR ULTRASOUND/IVUS N/A 09/12/2017   Procedure: INTRAVASCULAR ULTRASOUND/IVUS;  Surgeon: Elam Dutch, MD;  Location: [facility];  Service: Cardiovascular;  Laterality: N/A;   LEFT HEART CATH AND CORONARY ANGIOGRAPHY N/A 04/27/2019   Procedure: LEFT HEART CATH AND CORONARY ANGIOGRAPHY;  Surgeon: Troy Sine, MD;  Location: [facility];  Service: Cardiovascular;  Laterality: N/A;   LOWER EXTREMITY ANGIOGRAPHY Left 10/02/2020   Procedure: LOWER EXTREMITY ANGIOGRAPHY;  Surgeon: Algernon Huxley, MD;  Location: [facility];  Service: Cardiovascular;  Laterality: Left;   NECK SURGERY  1998   fusion   PERCUTANEOUS VENOUS THROMBECTOMY,LYSIS WITH INTRAVASCULAR ULTRASOUND (IVUS) Left 09/13/2017   Procedure: REMOVAL OF LYSIS CATHETER AND INTRAVASCULAR ULTRASOUND (IVUS) LEFT ILIAC VEIN;  Surgeon: Elam Dutch, MD;  Location: [facility];  Service: Vascular;  Laterality: Left;   PERIPHERAL VASCULAR INTERVENTION  09/15/2017   Procedure: PERIPHERAL VASCULAR INTERVENTION;  Surgeon: Waynetta Sandy, MD;  Location: [facility];  Service: Cardiovascular;;  VEINOUS/STENT   PERIPHERAL VASCULAR THROMBECTOMY Left 09/12/2017   Procedure: PERIPHERAL VASCULAR THROMBECTOMY;  Surgeon: Elam Dutch, MD;  Location: [facility];  Service: Cardiovascular;  Laterality: Left;   PERIPHERAL VASCULAR THROMBECTOMY Left 09/15/2017    Procedure: PERIPHERAL VASCULAR THROMBECTOMY;  Surgeon: Waynetta Sandy, MD;  Location: [facility];  Service: Cardiovascular;  Laterality: Left;  IVC TO LT FEM/POP VEIN   SHOULDER ARTHROSCOPY WITH SUBACROMIAL DECOMPRESSION Left 06/23/2013   Procedure: LEFT SHOULDER ARTHROSCOPY WITH DEBRIDEMENT ROTATOR CUFF AND LABRUM;  Surgeon: Lorn Junes, MD;  Location: [facility];  Service: Orthopedics;  Laterality: Left;   SHOULDER SURGERY  1999   left     SOCIAL HISTORY:  Social History   Socioeconomic History   Marital status: Widowed    Spouse name: Not on file   Number of children: 2   Years of education: Not on file   Highest education level: Not on file  Occupational History   Occupation: works at [employer] store    Employer: [employer]   Tobacco Use   Smoking status: Every Day    Packs/day: 1.50    Years: 29.00    Total pack years: 43.50    Types: Cigarettes    Start date: 04/09/1970   Smokeless tobacco: Never   Tobacco comments:    09/04/20 - states she is down to 9 ciggs / day   Vaping Use   Vaping Use: Never used  Substance and Sexual Activity   Alcohol use: Not Currently   Drug use: No  Sexual activity: Not Currently    Birth control/protection: Post-menopausal  Other Topics Concern   Not on file  Social History Narrative   Not on file   Social Determinants of Health   Financial Resource Strain: Not on file  Food Insecurity: Not on file  Transportation Needs: Not on file  Physical Activity: Unknown (09/15/2017)   Exercise Vital Sign    Days of Exercise per Week: Patient refused    Minutes of Exercise per Session: Patient refused  Stress: Unknown (09/15/2017)   [place]    Feeling of Stress : Patient refused  Social Connections: Unknown (09/15/2017)   Social Connection and Isolation Panel [NHANES]    Frequency of Communication with Friends and Family: Patient refused    Frequency  of Social Gatherings with Friends and Family: Patient refused    Attends Religious Services: Patient refused    Active Member of Clubs or Organizations: Patient refused    Attends Archivist Meetings: Patient refused    Marital Status: Patient refused  Intimate Partner Violence: Unknown (09/15/2017)   Humiliation, Afraid, Rape, and Kick questionnaire    Fear of Current or Ex-Partner: Patient refused    Emotionally Abused: Patient refused    Physically Abused: Patient refused    Sexually Abused: Patient refused    FAMILY HISTORY:  Family History  Problem Relation Age of Onset   Diabetes Father    Hypertension Father    Parkinson's disease Father    Cancer Father        not sure what kind   Hypertension Mother    Heart disease Mother    Drug abuse Sister    Mesothelioma Brother    Cancer Daughter        non-hodgkins lmphoma   Hypertension Brother    Diabetes Brother    Colon cancer Neg Hx    Gastric cancer Neg Hx    Esophageal cancer Neg Hx     CURRENT MEDICATIONS:  Outpatient Encounter Medications as of 01/10/2022  Medication Sig   acetaminophen (TYLENOL) 500 MG tablet Take 500 mg by mouth every 6 (six) hours as needed for mild pain or headache.   albuterol (VENTOLIN HFA) 108 (90 Base) MCG/ACT inhaler Inhale 2 puffs into the lungs every 6 (six) hours as needed for wheezing or shortness of breath.   alendronate (FOSAMAX) 70 MG tablet Take 70 mg by mouth once a week.   atorvastatin (LIPITOR) 80 MG tablet Take 1 tablet (80 mg total) by mouth daily.   BD PEN NEEDLE NANO U/F 32G X 4 MM MISC USE FOR VICTOZA INJECTIONS   cetirizine (ZYRTEC) 10 MG tablet Take 10 mg by mouth at bedtime as needed for allergies. (Patient not taking: Reported on 10/22/2021)   clopidogrel (PLAVIX) 75 MG tablet Take 1 tablet (75 mg total) by mouth daily with breakfast.   dofetilide (TIKOSYN) 250 MCG capsule Take 1 capsule (250 mcg total) by mouth 2 (two) times daily.   fluticasone (FLONASE) 50  MCG/ACT nasal spray Place 2 sprays into both nostrils daily as needed for rhinitis (congestion/ dryness).   HYDROcodone-acetaminophen (NORCO/VICODIN) 5-325 MG tablet Take 1 tablet by mouth every 6 (six) hours as needed.   JARDIANCE 10 MG TABS tablet Take 10 mg by mouth daily.   Lancets (ONETOUCH DELICA PLUS ZDGLOV56E) MISC 1 each daily.   losartan (COZAAR) 100 MG tablet Take 100 mg by mouth daily with breakfast.    nitroGLYCERIN (NITROSTAT) 0.4 MG SL  tablet Place 1 tablet (0.4 mg total) under the tongue every 5 (five) minutes as needed for chest pain.   potassium chloride SA (KLOR-CON) 20 MEQ tablet Take 1 tablet (20 mEq total) by mouth 2 (two) times daily.   rivaroxaban (XARELTO) 20 MG TABS tablet Take 1 tablet (20 mg total) by mouth daily with supper.   Skin Protectants, Misc. (EUCERIN) cream Apply 1 Application topically daily.   spironolactone (ALDACTONE) 25 MG tablet Take 25 mg by mouth daily with breakfast.    torsemide (DEMADEX) 20 MG tablet Take 60 mg by mouth daily after breakfast.    VICTOZA 18 MG/3ML SOPN Inject 1.8 mg into the skin at bedtime.    VITAMIN D PO Take 2,000 Units by mouth daily.   No facility-administered encounter medications on file as of 01/10/2022.    ALLERGIES:  Allergies  Allergen Reactions   Penicillins Hives and Rash    Has patient had a PCN reaction causing immediate rash, facial/tongue/throat swelling, SOB or lightheadedness with hypotension: Yes Has patient had a PCN reaction causing severe rash involving mucus membranes or skin necrosis: No Has patient had a PCN reaction that required hospitalization No Has patient had a PCN reaction occurring within the last 10 years: No If all of the above answers are "NO", then may proceed with Cephalosporin use.    Doxycycline Itching     PHYSICAL EXAM:  ECOG PERFORMANCE STATUS: 1 - Symptomatic but completely ambulatory  There were no vitals filed for this visit. There were no vitals filed for this  visit. Physical Exam Constitutional:      Appearance: Normal appearance. She is morbidly obese.  HENT:     Head: Normocephalic and atraumatic.     Mouth/Throat:     Mouth: Mucous membranes are moist.  Eyes:     Extraocular Movements: Extraocular movements intact.     Pupils: Pupils are equal, round, and reactive to light.  Cardiovascular:     Rate and Rhythm: Normal rate and regular rhythm.     Pulses: Normal pulses.     Heart sounds: Murmur heard.  Pulmonary:     Effort: Pulmonary effort is normal.     Breath sounds: Normal breath sounds.  Abdominal:     General: Bowel sounds are normal.     Palpations: Abdomen is soft.     Tenderness: There is no abdominal tenderness.  Musculoskeletal:        General: No swelling.     Right lower leg: Edema present.     Left lower leg: Edema (chronic lymphedema of bilateral lower extremities) present.  Lymphadenopathy:     Cervical: No cervical adenopathy.  Skin:    General: Skin is warm and dry.     Comments: Dusky erythema of bilateral lower extremities secondary to venous stasis dermatitis   Neurological:     General: No focal deficit present.     Mental Status: She is alert and oriented to person, place, and time.  Psychiatric:        Mood and Affect: Mood normal.        Behavior: Behavior normal.      LABORATORY DATA:  I have reviewed the labs as listed.  CBC    Component Value Date/Time   WBC 8.7 08/29/2021 0523   RBC 4.00 08/29/2021 0523   HGB 12.6 08/29/2021 0523   HGB 12.2 06/02/2020 0946   HCT 36.4 08/29/2021 0523   HCT 38.0 06/02/2020 0946   PLT 175 08/29/2021 0523  PLT 329 06/02/2020 0946   MCV 91.0 08/29/2021 0523   MCV 83 06/02/2020 0946   MCH 31.5 08/29/2021 0523   MCHC 34.6 08/29/2021 0523   RDW 14.5 08/29/2021 0523   RDW 14.4 06/02/2020 0946   LYMPHSABS 2.0 08/28/2021 0543   MONOABS 1.4 (H) 08/28/2021 0543   EOSABS 0.2 08/28/2021 0543   BASOSABS 0.0 08/28/2021 0543      Latest Ref Rng & Units  08/29/2021    5:23 AM 08/28/2021    5:43 AM 08/27/2021    3:50 AM  CMP  Glucose 70 - 99 mg/dL 137  150  145   BUN 8 - 23 mg/dL '11  11  8   '$ Creatinine 0.44 - 1.00 mg/dL 0.63  0.72  0.74   Sodium 135 - 145 mmol/L 135  136  137   Potassium 3.5 - 5.1 mmol/L 4.3  3.7  3.4   Chloride 98 - 111 mmol/L 108  108  108   CO2 22 - 32 mmol/L '22  22  24   '$ Calcium 8.9 - 10.3 mg/dL 9.1  8.9  9.0   Total Protein 6.5 - 8.1 g/dL   6.0   Total Bilirubin 0.3 - 1.2 mg/dL   1.5   Alkaline Phos 38 - 126 U/L   54   AST 15 - 41 U/L   20   ALT 0 - 44 U/L   16     DIAGNOSTIC IMAGING:  I have independently reviewed the relevant imaging and discussed with the patient.  ASSESSMENT & PLAN: 1.  Iron deficiency state: - Patient seen at the request of Dr. Willey Blade for further management of iron deficiency state. - Labs from 12/11/2020 showed hemoglobin 12.4, MCV 78, low MCH and MCHC and high RDW consistent with iron deficiency state.  Ferritin was 22. - She could not tolerate iron tablets in the past which caused diarrhea.  Denies any prior history of transfusion. - Last colonoscopy on 12/28/2013: Colonic diverticulosis. - She is on Xarelto and Plavix for A. fib and peripheral vascular disease. - Additional work-up revealed normal vitamin B12, folate, LDH, SPEP, and reticulocytes  -- Unable to tolerate oral iron due to diarrhea - She complained of extreme fatigue prior to IV iron infusions, but this improved after IV iron, most recently given on 03/30/2021 and 04/09/2021 - She continues to have fatigue.  Mild ice pica has returned. - No bright red blood per rectum or melena - Labs today (01/10/2022): Hgb 15.4/MCV 90.5, ferritin 109, iron saturation 18% - PLAN: Stool cards x 3, will refer to GI if positive. -- No indication for IV iron at this time. - Iron levels are trending downward and patient reports recurrent pica, therefore we will recheck CBC/iron and discuss via phone visit in 3 months.   2.  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 - She has had leg claudication symptoms related to atherosclerosis of the native artery of the left leg with rest pain. - On 10/02/2020 she had left leg balloon angioplasty of left posterior tibial artery, mechanical thrombectomy of the proximal left popliteal artery, and balloon angioplasty of proximal left popliteal artery, and stent placement to proximal left popliteal artery. - She follows with  [facility] for her extensive peripheral vascular disease  - She is taking Xarelto and Plavix    - Exam shows chronic lymphedema of left leg with left calf circumference markedly larger than right leg.  Patient states that this is chronic and unchanged.   -- She continues to follow closely with vascular surgery.  Most recent venous duplex on 06/20/2021 of right leg was negative for DVT. - PLAN: Patient instructed to continue Xarelto and to continue follow-up with her established vein and vascular specialists.   3. Social/family history: - Lives at home with her daughter.  Independent of ADLs and IADLs.  She works as a Environmental education officer at [employer].  She started smoking at age 65, 2 to 3 packs/day and quit for 16 years.  Restarted back again 5 years back when her husband died.  She is currently smoking 10 cigarettes/day now. - Father, maternal grandfather and maternal grandmother had cancers.  Type unknown to the patient.    - - - - - - - - - - - - - - - - - - PLAN SUMMARY: >> Stool cards x 3 >> Labs in 3 months (CBC/D, ferritin, iron/TIBC) >> PHONE visit 1 week after labs   All questions were answered. The patient knows to  call the clinic with any problems, questions or concerns.  Medical decision making: Low  Time spent on visit: I spent 15 minutes counseling the patient face to face. The total time spent in the appointment was 22 minutes and more than 50% was on counseling.   Harriett Rush, PA-C  01/10/2022 10:38 AM

## 2022-01-10 ENCOUNTER — Inpatient Hospital Stay: Payer: Medicare Other | Attending: Physician Assistant | Admitting: Physician Assistant

## 2022-01-10 ENCOUNTER — Other Ambulatory Visit: Payer: Self-pay

## 2022-01-10 ENCOUNTER — Inpatient Hospital Stay: Payer: Medicare Other

## 2022-01-10 VITALS — BP 137/78 | HR 51 | Temp 97.5°F | Resp 18 | Ht 64.0 in | Wt 263.1 lb

## 2022-01-10 DIAGNOSIS — G479 Sleep disorder, unspecified: Secondary | ICD-10-CM | POA: Diagnosis not present

## 2022-01-10 DIAGNOSIS — M7989 Other specified soft tissue disorders: Secondary | ICD-10-CM | POA: Insufficient documentation

## 2022-01-10 DIAGNOSIS — Z86010 Personal history of colonic polyps: Secondary | ICD-10-CM | POA: Diagnosis not present

## 2022-01-10 DIAGNOSIS — Z814 Family history of other substance abuse and dependence: Secondary | ICD-10-CM | POA: Insufficient documentation

## 2022-01-10 DIAGNOSIS — R718 Other abnormality of red blood cells: Secondary | ICD-10-CM

## 2022-01-10 DIAGNOSIS — M549 Dorsalgia, unspecified: Secondary | ICD-10-CM | POA: Insufficient documentation

## 2022-01-10 DIAGNOSIS — D509 Iron deficiency anemia, unspecified: Secondary | ICD-10-CM

## 2022-01-10 DIAGNOSIS — F17210 Nicotine dependence, cigarettes, uncomplicated: Secondary | ICD-10-CM | POA: Insufficient documentation

## 2022-01-10 DIAGNOSIS — Z87440 Personal history of urinary (tract) infections: Secondary | ICD-10-CM | POA: Diagnosis not present

## 2022-01-10 DIAGNOSIS — Z8719 Personal history of other diseases of the digestive system: Secondary | ICD-10-CM | POA: Diagnosis not present

## 2022-01-10 DIAGNOSIS — I10 Essential (primary) hypertension: Secondary | ICD-10-CM | POA: Insufficient documentation

## 2022-01-10 DIAGNOSIS — M5431 Sciatica, right side: Secondary | ICD-10-CM | POA: Diagnosis not present

## 2022-01-10 DIAGNOSIS — R200 Anesthesia of skin: Secondary | ICD-10-CM | POA: Diagnosis not present

## 2022-01-10 DIAGNOSIS — Z9884 Bariatric surgery status: Secondary | ICD-10-CM | POA: Diagnosis not present

## 2022-01-10 DIAGNOSIS — Z86718 Personal history of other venous thrombosis and embolism: Secondary | ICD-10-CM | POA: Diagnosis not present

## 2022-01-10 DIAGNOSIS — Z79899 Other long term (current) drug therapy: Secondary | ICD-10-CM | POA: Diagnosis not present

## 2022-01-10 DIAGNOSIS — M2550 Pain in unspecified joint: Secondary | ICD-10-CM | POA: Diagnosis not present

## 2022-01-10 DIAGNOSIS — Z880 Allergy status to penicillin: Secondary | ICD-10-CM | POA: Insufficient documentation

## 2022-01-10 DIAGNOSIS — Z7902 Long term (current) use of antithrombotics/antiplatelets: Secondary | ICD-10-CM | POA: Insufficient documentation

## 2022-01-10 DIAGNOSIS — R519 Headache, unspecified: Secondary | ICD-10-CM | POA: Diagnosis not present

## 2022-01-10 DIAGNOSIS — R5383 Other fatigue: Secondary | ICD-10-CM | POA: Diagnosis not present

## 2022-01-10 DIAGNOSIS — Z9049 Acquired absence of other specified parts of digestive tract: Secondary | ICD-10-CM | POA: Insufficient documentation

## 2022-01-10 DIAGNOSIS — Z833 Family history of diabetes mellitus: Secondary | ICD-10-CM | POA: Insufficient documentation

## 2022-01-10 DIAGNOSIS — E119 Type 2 diabetes mellitus without complications: Secondary | ICD-10-CM | POA: Diagnosis not present

## 2022-01-10 DIAGNOSIS — Z881 Allergy status to other antibiotic agents status: Secondary | ICD-10-CM | POA: Insufficient documentation

## 2022-01-10 DIAGNOSIS — Z809 Family history of malignant neoplasm, unspecified: Secondary | ICD-10-CM | POA: Insufficient documentation

## 2022-01-10 DIAGNOSIS — Z7901 Long term (current) use of anticoagulants: Secondary | ICD-10-CM | POA: Insufficient documentation

## 2022-01-10 DIAGNOSIS — Z818 Family history of other mental and behavioral disorders: Secondary | ICD-10-CM | POA: Insufficient documentation

## 2022-01-10 DIAGNOSIS — Z8249 Family history of ischemic heart disease and other diseases of the circulatory system: Secondary | ICD-10-CM | POA: Insufficient documentation

## 2022-01-10 LAB — CBC WITH DIFFERENTIAL/PLATELET
Abs Immature Granulocytes: 0.02 10*3/uL (ref 0.00–0.07)
Basophils Absolute: 0 10*3/uL (ref 0.0–0.1)
Basophils Relative: 1 %
Eosinophils Absolute: 0.1 10*3/uL (ref 0.0–0.5)
Eosinophils Relative: 2 %
HCT: 46.6 % — ABNORMAL HIGH (ref 36.0–46.0)
Hemoglobin: 15.4 g/dL — ABNORMAL HIGH (ref 12.0–15.0)
Immature Granulocytes: 0 %
Lymphocytes Relative: 23 %
Lymphs Abs: 1.8 10*3/uL (ref 0.7–4.0)
MCH: 29.9 pg (ref 26.0–34.0)
MCHC: 33 g/dL (ref 30.0–36.0)
MCV: 90.5 fL (ref 80.0–100.0)
Monocytes Absolute: 0.9 10*3/uL (ref 0.1–1.0)
Monocytes Relative: 11 %
Neutro Abs: 5 10*3/uL (ref 1.7–7.7)
Neutrophils Relative %: 63 %
Platelets: 261 10*3/uL (ref 150–400)
RBC: 5.15 MIL/uL — ABNORMAL HIGH (ref 3.87–5.11)
RDW: 14 % (ref 11.5–15.5)
WBC: 7.9 10*3/uL (ref 4.0–10.5)
nRBC: 0 % (ref 0.0–0.2)

## 2022-01-10 LAB — IRON AND TIBC
Iron: 74 ug/dL (ref 28–170)
Saturation Ratios: 18 % (ref 10.4–31.8)
TIBC: 412 ug/dL (ref 250–450)
UIBC: 338 ug/dL

## 2022-01-10 LAB — FERRITIN: Ferritin: 109 ng/mL (ref 11–307)

## 2022-01-10 NOTE — Patient Instructions (Signed)
[facility] Discharge Instructions  You were seen today by Tarri Abernethy PA-C for your iron deficiency.  Your blood and iron levels look good, but have decreased slightly since your last visit.  You do not need any IV iron at this time, but we will check labs again in 3 months and discuss via phone visit to see if you need IV iron at that time.  I would also like you to complete stool cards x 3 to see if you have any blood in your bowel movements.  Please bring completed kit back to the fourth floor [facility]).   Thank you for choosing [facility] at [facility] to provide your oncology and hematology care.  To afford each patient quality time with our provider, please arrive at least 15 minutes before your scheduled appointment time.   If you have a lab appointment with the [facility] please come in thru the Main Entrance and check in at the main information desk.  You need to re-schedule your appointment should you arrive 10 or more minutes late.  We strive to give you quality time with our providers, and arriving late affects you and other patients whose appointments are after yours.  Also, if you no show three or more times for appointments you may be dismissed from the clinic at the providers discretion.     Again, thank you for choosing [facility].  Our hope is that these requests will decrease the amount of time that you wait before being seen by our physicians.       _____________________________________________________________  Should you have questions after your visit to [facility], please contact our office at [phone] and follow the prompts.  Our office hours are 8:00 a.m. and 4:30 p.m. Monday - Friday.  Please note that voicemails left after 4:00 p.m. may not be returned until the following business day.  We are closed weekends and major holidays.  You do have access to a  nurse 24-7, just call the main number to the clinic [phone] and do not press any options, hold on the line and a nurse will answer the phone.    For prescription refill requests, have your pharmacy contact our office and allow 72 hours.    Due to Covid, you will need to wear a mask upon entering the hospital. If you do not have a mask, a mask will be given to you at the Main Entrance upon arrival. For doctor visits, patients may have 1 support person age 79 or older with them. For treatment visits, patients can not have anyone with them due to social distancing guidelines and our immunocompromised population.

## 2022-01-14 DIAGNOSIS — M5416 Radiculopathy, lumbar region: Secondary | ICD-10-CM | POA: Diagnosis not present

## 2022-01-29 DIAGNOSIS — I4891 Unspecified atrial fibrillation: Secondary | ICD-10-CM | POA: Diagnosis not present

## 2022-01-29 DIAGNOSIS — E1129 Type 2 diabetes mellitus with other diabetic kidney complication: Secondary | ICD-10-CM | POA: Diagnosis not present

## 2022-01-29 DIAGNOSIS — Z79899 Other long term (current) drug therapy: Secondary | ICD-10-CM | POA: Diagnosis not present

## 2022-01-29 DIAGNOSIS — R7401 Elevation of levels of liver transaminase levels: Secondary | ICD-10-CM | POA: Diagnosis not present

## 2022-01-29 DIAGNOSIS — I10 Essential (primary) hypertension: Secondary | ICD-10-CM | POA: Diagnosis not present

## 2022-02-12 DIAGNOSIS — I4891 Unspecified atrial fibrillation: Secondary | ICD-10-CM | POA: Diagnosis not present

## 2022-02-12 DIAGNOSIS — E1122 Type 2 diabetes mellitus with diabetic chronic kidney disease: Secondary | ICD-10-CM | POA: Diagnosis not present

## 2022-02-12 DIAGNOSIS — R7309 Other abnormal glucose: Secondary | ICD-10-CM | POA: Diagnosis not present

## 2022-02-12 DIAGNOSIS — I10 Essential (primary) hypertension: Secondary | ICD-10-CM | POA: Diagnosis not present

## 2022-02-12 DIAGNOSIS — E785 Hyperlipidemia, unspecified: Secondary | ICD-10-CM | POA: Diagnosis not present

## 2022-02-20 DIAGNOSIS — Z6841 Body Mass Index (BMI) 40.0 and over, adult: Secondary | ICD-10-CM | POA: Diagnosis not present

## 2022-02-20 DIAGNOSIS — M5416 Radiculopathy, lumbar region: Secondary | ICD-10-CM | POA: Diagnosis not present

## 2022-02-20 DIAGNOSIS — M48061 Spinal stenosis, lumbar region without neurogenic claudication: Secondary | ICD-10-CM | POA: Diagnosis not present

## 2022-03-06 ENCOUNTER — Other Ambulatory Visit: Payer: Self-pay | Admitting: Neurosurgery

## 2022-03-06 ENCOUNTER — Encounter (HOSPITAL_COMMUNITY): Payer: Self-pay | Admitting: Hematology

## 2022-03-08 NOTE — Pre-Procedure Instructions (Signed)
Surgical Instructions    Your procedure is scheduled on Friday, February 2nd.  Report to [facility] Main Entrance "A" at 06:00 A.M., then check in with the Admitting office.  Call this number if you have problems the morning of surgery:  [phone]  If you have any questions prior to your surgery date call [phone]: Open Monday-Friday 8am-4pm If you experience any cold or flu symptoms such as cough, fever, chills, shortness of breath, etc. between now and your scheduled surgery, please notify us at the above number.     Remember:  Do not eat or drink after midnight the night before your surgery     Take these medicines the morning of surgery with A SIP OF WATER  atorvastatin (LIPITOR)  dofetilide (TIKOSYN)     If needed: albuterol (VENTOLIN HFA)- if needed, bring with you on day of surgery fluticasone (FLONASE)  nitroGLYCERIN (NITROSTAT)   Follow your surgeon's instructions on when to stop Plavix and Xarelto.  If no instructions were given by your surgeon then you will need to call the office to get those instructions.     As of today, STOP taking any Aspirin (unless otherwise instructed by your surgeon) Aleve, Naproxen, Ibuprofen, Motrin, Advil, Goody's, BC's, all herbal medications, fish oil, and all vitamins.  WHAT DO I DO ABOUT MY DIABETES MEDICATION?   Do not take glipiZIDE (GLUCOTROL XL) the morning of surgery.  Hold JARDIANCE for 72 hours prior to surgery. Last dose 1/29.      Hold VICTOZA 24 hours prior to surgery. Last dose 1/31.    HOW TO MANAGE YOUR DIABETES BEFORE AND AFTER SURGERY  Why is it important to control my blood sugar before and after surgery? Improving blood sugar levels before and after surgery helps healing and can limit problems. A way of improving blood sugar control is eating a healthy diet by:  Eating less sugar and carbohydrates  Increasing activity/exercise  Talking with your doctor about reaching your blood sugar goals High  blood sugars (greater than 180 mg/dL) can raise your risk of infections and slow your recovery, so you will need to focus on controlling your diabetes during the weeks before surgery. Make sure that the doctor who takes care of your diabetes knows about your planned surgery including the date and location.  How do I manage my blood sugar before surgery? Check your blood sugar at least 4 times a day, starting 2 days before surgery, to make sure that the level is not too high or low.  Check your blood sugar the morning of your surgery when you wake up and every 2 hours until you get to the Short Stay unit.  If your blood sugar is less than 70 mg/dL, you will need to treat for low blood sugar: Do not take insulin. Treat a low blood sugar (less than 70 mg/dL) with  cup of clear juice (cranberry or apple), 4 glucose tablets, OR glucose gel. Recheck blood sugar in 15 minutes after treatment (to make sure it is greater than 70 mg/dL). If your blood sugar is not greater than 70 mg/dL on recheck, call [phone] for further instructions. Report your blood sugar to the short stay nurse when you get to Short Stay.  If you are admitted to the hospital after surgery: Your blood sugar will be checked by the staff and you will probably be given insulin after surgery (instead of oral diabetes medicines) to make sure you have good blood sugar levels. The goal for blood  sugar control after surgery is 80-180 mg/dL.                     Do NOT Smoke (Tobacco/Vaping) for 24 hours prior to your procedure.  If you use a CPAP at night, you may bring your mask/headgear for your overnight stay.   Contacts, glasses, piercing's, hearing aid's, dentures or partials may not be worn into surgery, please bring cases for these belongings.    For patients admitted to the hospital, discharge time will be determined by your treatment team.   Patients discharged the day of surgery will not be allowed to drive home, and  someone needs to stay with them for 24 hours.  SURGICAL WAITING ROOM VISITATION Patients having surgery or a procedure may have no more than 2 support people in the waiting area - these visitors may rotate.   Children under the age of 67 must have an adult with them who is not the patient. If the patient needs to stay at the hospital during part of their recovery, the visitor guidelines for inpatient rooms apply. Pre-op nurse will coordinate an appropriate time for 1 support person to accompany patient in pre-op.  This support person may not rotate.   Please refer to the [facility] website for the visitor guidelines for Inpatients (after your surgery is over and you are in a regular room).    Special instructions:   [place]- Preparing For Surgery  Before surgery, you can play an important role. Because skin is not sterile, your skin needs to be as free of germs as possible. You can reduce the number of germs on your skin by washing with CHG (chlorahexidine gluconate) Soap before surgery.  CHG is an antiseptic cleaner which kills germs and bonds with the skin to continue killing germs even after washing.    Oral Hygiene is also important to reduce your risk of infection.  Remember - BRUSH YOUR TEETH THE MORNING OF SURGERY WITH YOUR REGULAR TOOTHPASTE  Please do not use if you have an allergy to CHG or antibacterial soaps. If your skin becomes reddened/irritated stop using the CHG.  Do not shave (including legs and underarms) for at least 48 hours prior to first CHG shower. It is OK to shave your face.  Please follow these instructions carefully.   Shower the NIGHT BEFORE SURGERY and the MORNING OF SURGERY  If you chose to wash your hair, wash your hair first as usual with your normal shampoo.  After you shampoo, rinse your hair and body thoroughly to remove the shampoo.  Use CHG Soap as you would any other liquid soap. You can apply CHG directly to the skin and wash gently with a  scrungie or a clean washcloth.   Apply the CHG Soap to your body ONLY FROM THE NECK DOWN.  Do not use on open wounds or open sores. Avoid contact with your eyes, ears, mouth and genitals (private parts). Wash Face and genitals (private parts)  with your normal soap.   Wash thoroughly, paying special attention to the area where your surgery will be performed.  Thoroughly rinse your body with warm water from the neck down.  DO NOT shower/wash with your normal soap after using and rinsing off the CHG Soap.  Pat yourself dry with a CLEAN TOWEL.  Wear CLEAN PAJAMAS to bed the night before surgery  Place CLEAN SHEETS on your bed the night before your surgery  DO NOT SLEEP WITH PETS.  Day of Surgery: Take a shower with CHG soap. Do not wear jewelry or makeup Do not wear lotions, powders, perfumes, or deodorant. Do not shave 48 hours prior to surgery.   Do not bring valuables to the hospital. [facility] is not responsible for any belongings or valuables. Do not wear nail polish, gel polish, artificial nails, or any other type of covering on natural nails (fingers and toes) If you have artificial nails or gel coating that need to be removed by a nail salon, please have this removed prior to surgery. Artificial nails or gel coating may interfere with anesthesia's ability to adequately monitor your vital signs. Wear Clean/Comfortable clothing the morning of surgery Remember to brush your teeth WITH YOUR REGULAR TOOTHPASTE.   Please read over the following fact sheets that you were given.    If you received a COVID test during your pre-op visit  it is requested that you wear a mask when out in public, stay away from anyone that may not be feeling well and notify your surgeon if you develop symptoms. If you have been in contact with anyone that has tested positive in the last 10 days please notify you surgeon.

## 2022-03-11 ENCOUNTER — Other Ambulatory Visit: Payer: Self-pay

## 2022-03-11 ENCOUNTER — Encounter (HOSPITAL_COMMUNITY): Payer: Self-pay

## 2022-03-11 ENCOUNTER — Encounter (HOSPITAL_COMMUNITY)
Admission: RE | Admit: 2022-03-11 | Discharge: 2022-03-11 | Disposition: A | Discharge status: Home / Self Care | Payer: Medicare HMO | Source: Ambulatory Visit | Attending: Neurosurgery | Admitting: Neurosurgery

## 2022-03-11 VITALS — BP 139/47 | HR 54 | Temp 97.6°F | Resp 18 | Ht 64.0 in | Wt 272.8 lb

## 2022-03-11 DIAGNOSIS — E1165 Type 2 diabetes mellitus with hyperglycemia: Secondary | ICD-10-CM | POA: Insufficient documentation

## 2022-03-11 DIAGNOSIS — Z01818 Encounter for other preprocedural examination: Secondary | ICD-10-CM

## 2022-03-11 DIAGNOSIS — Z01812 Encounter for preprocedural laboratory examination: Secondary | ICD-10-CM | POA: Insufficient documentation

## 2022-03-11 HISTORY — DX: Hyperlipidemia, unspecified: E78.5

## 2022-03-11 HISTORY — DX: Nonrheumatic aortic (valve) stenosis: I35.0

## 2022-03-11 HISTORY — DX: Atherosclerotic heart disease of native coronary artery without angina pectoris: I25.10

## 2022-03-11 HISTORY — DX: Peripheral vascular disease, unspecified: I73.9

## 2022-03-11 LAB — BASIC METABOLIC PANEL
Anion gap: 4 — ABNORMAL LOW (ref 5–15)
BUN: 13 mg/dL (ref 8–23)
CO2: 27 mmol/L (ref 22–32)
Calcium: 9.1 mg/dL (ref 8.9–10.3)
Chloride: 104 mmol/L (ref 98–111)
Creatinine, Ser: 0.74 mg/dL (ref 0.44–1.00)
GFR, Estimated: >60 mL/min (ref >60)
Glucose, Bld: 149 mg/dL — ABNORMAL HIGH (ref 70–99)
Potassium: 3.9 mmol/L (ref 3.5–5.1)
Sodium: 135 mmol/L (ref 135–145)

## 2022-03-11 LAB — HEMOGLOBIN A1C
Hgb A1c MFr Bld: 8 % — ABNORMAL HIGH (ref 4.8–5.6)
Mean Plasma Glucose: 182.9 mg/dL

## 2022-03-11 LAB — CBC
HCT: 42.4 % (ref 36.0–46.0)
Hemoglobin: 14 g/dL (ref 12.0–15.0)
MCH: 30.6 pg (ref 26.0–34.0)
MCHC: 33 g/dL (ref 30.0–36.0)
MCV: 92.6 fL (ref 80.0–100.0)
Platelets: 254 10*3/uL (ref 150–400)
RBC: 4.58 MIL/uL (ref 3.87–5.11)
RDW: 14.7 % (ref 11.5–15.5)
WBC: 9.9 10*3/uL (ref 4.0–10.5)
nRBC: 0 % (ref 0.0–0.2)

## 2022-03-11 LAB — GLUCOSE, CAPILLARY: Glucose-Capillary: 181 mg/dL — ABNORMAL HIGH (ref 70–99)

## 2022-03-11 LAB — SURGICAL PCR SCREEN
MRSA, PCR: NEGATIVE
Staphylococcus aureus: POSITIVE — AB

## 2022-03-11 NOTE — Progress Notes (Signed)
PCP - Dr. Asencion Noble Cardiologist -   PPM/ICD - denies  Chest x-ray -  EKG - 09/06/2021 Stress Test - 3/16/218 ECHO - 09/11/2021 Cardiac Cath - 04/27/2019  Sleep Study - 08/31/2018 CPAP - denies  Fasting Blood Sugar - 150-180 Checks Blood Sugar _2____ times a day  Last dose of GLP1 agonist-  03/13/2022 GLP1 instructions: hold 24 hours per instructions  ASA/ Blood Thinner; Plavix hold 5 days; xarelto hold 3 days.  Patient held both on her own on 03/09/2022  ERAS Protcol - no  COVID TEST- n/a   Anesthesia review: cardiac history  Patient denies shortness of breath, fever, cough and chest pain at PAT appointment   All instructions explained to the patient, with a verbal understanding of the material. Patient agrees to go over the instructions while at home for a better understanding. Patient also instructed to self quarantine after being tested for COVID-19. The opportunity to ask questions was provided.

## 2022-03-12 NOTE — Anesthesia Preprocedure Evaluation (Addendum)
Anesthesia Evaluation  Patient identified by MRN, date of birth, ID band Patient awake    Reviewed: Allergy & Precautions, H&P , NPO status , Patient's Chart, lab work & pertinent test results  History of Anesthesia Complications (+) PONV and history of anesthetic complications  Airway Mallampati: I  TM Distance: >3 FB Neck ROM: Full    Dental no notable dental hx. (+) Edentulous Upper, Edentulous Lower, Dental Advisory Given   Pulmonary asthma , Current Smoker and Patient abstained from smoking.   Pulmonary exam normal breath sounds clear to auscultation       Cardiovascular Exercise Tolerance: Good hypertension, Pt. on medications + CAD, + Past MI, + Cardiac Stents and + Peripheral Vascular Disease  + dysrhythmias Atrial Fibrillation + Valvular Problems/Murmurs AS  Rhythm:Regular Rate:Normal     Neuro/Psych negative neurological ROS  negative psych ROS   GI/Hepatic Neg liver ROS,GERD  ,,  Endo/Other  diabetes, Type 2, Oral Hypoglycemic Agents  Morbid obesity  Renal/GU negative Renal ROS  negative genitourinary   Musculoskeletal  (+) Arthritis , Osteoarthritis,    Abdominal   Peds  Hematology  (+) Blood dyscrasia, anemia   Anesthesia Other Findings   Reproductive/Obstetrics negative OB ROS                             Anesthesia Physical Anesthesia Plan  ASA: 3  Anesthesia Plan: General   Post-op Pain Management: Tylenol PO (pre-op)*   Induction: Intravenous  PONV Risk Score and Plan: 4 or greater and Ondansetron, Dexamethasone, Midazolam and Treatment may vary due to age or medical condition  Airway Management Planned: Oral ETT  Additional Equipment: Arterial line  Intra-op Plan:   Post-operative Plan: Extubation in OR  Informed Consent: I have reviewed the patients History and Physical, chart, labs and discussed the procedure including the risks, benefits and  alternatives for the proposed anesthesia with the patient or authorized representative who has indicated his/her understanding and acceptance.     Dental advisory given  Plan Discussed with: CRNA  Anesthesia Plan Comments: (PAT note by Karoline Caldwell, PA-C:  Follows with cardiology for history of CAD with NSTEMI s/p DES to RCA in 04/2019, persistent atrial fibrillation on Tikosyn and Xarelto, mild to moderate aortic stenosis (mean gradient 15 mmHg by echo 09/2021), PAD s/p prior intervention of left lower extremity, hypertension, hyperlipidemia.  Seen by Sande Rives, PA-C 01/02/2022 via televisit for preop evaluation.  Per note, "Patient has an upcomingumbar transforaminal right L4-L5planned.Sheis stable from a cardiac standpoint with no angina, significant shortness of breath, acute CHF symptoms, palpitations, dizziness, syncope.Per Revised Cardiac Risk Index, considered Class II Risk with 6.0% 30-day risk of death, MI, or cardiac arrest given history of CAD with history of MI. She is well optimized from a cardiac standpoint and able to complete >4.0 METS of activity without any significant issues.Therefore, based on ACC/AHA guidelines, patient would be at acceptable risk for the planned procedure without further cardiovascular testing. Per Pharmacy and office protocol, patient can hold Xarelto for 3 day prior to procedure. Please restart this as soon as safely possible postoperatively. Patient is also on Plavix; however, per Dr. Nelly Laurence last note this is for his prior lower extremity intervention and not for a cardiac indication. Therefore, would defer to Vascular Surgery on recommendations for holding Plavix.I will route this recommendation to the requesting party via Epic fax function."  Patient reports last dose Plavix and Xarelto 03/09/2022.  Current every day  smoker.,  Non-insulin-dependent DM2, on daily GLP-1 agonist Victoza.  Patient reports last dose 03/13/2022.  A1c 8.0 on preop  labs.  History of gastric bypass surgery 2004.  Preop labs reviewed, unremarkable.  EKG 09/06/2021: Bradycardia.  Rate 50.  Low voltage QRS.  TTE 09/11/2021: 1. Left ventricular ejection fraction, by estimation, is 60 to 65%. The  left ventricle has normal function. The left ventricle has no regional  wall motion abnormalities. Left ventricular diastolic parameters are  indeterminate. The average left  ventricular global longitudinal strain is -24.0 %. The global longitudinal  strain is normal.  2. Right ventricular systolic function is normal. The right ventricular  size is normal.  3. The mitral valve is abnormal. Mild mitral valve regurgitation. No  evidence of mitral stenosis.  4. The aortic valve is tricuspid. There is mild calcification of the  aortic valve. There is mild thickening of the aortic valve. Aortic valve  regurgitation is trivial. Mild to moderate aortic valve stenosis. Aortic  valve mean gradient measures 15.0  mmHg. Aortic valve peak gradient measures 26.4 mmHg. Aortic valve area, by  VTI measures 1.23 cm.  5. There is mild dilatation of the ascending aorta, measuring 36 mm.  6. The inferior vena cava is normal in size with greater than 50%  respiratory variability, suggesting right atrial pressure of 3 mmHg.   Comparison(s): Echocardiogram done 04/28/19 showed an EF of 58% with mild  AS and an AV Mean Grad of 7 mmHg.   Cath and PCI 04/27/2019: ? Prox RCA lesion is 95% stenosed. ? Post intervention, there is a 0% residual stenosis. ? A stent was successfully placed.  Acute coronary syndrome secondary to high-grade thrombotic stenosis in the very proximal RCA in a dominant RCA vessel.  Normal left coronary circulation with a short left main, large LAD and left circumflex vessels.  Dominant RCA with 95% very proximal stenosis with thrombus burden with TIMI-3 flow.  Successful percutaneous coronary prevention with PTCA and ultimate stenting with a 3.0  x18 mm Resolute Onyx DES stent postdilated to 3.73 mm tapering to 3.68 mm with the percent stenosis being reduced to 0% and brisk TIMI-3 flow without evidence for dissection.  RECOMMENDATION: Consider initial triple drug therapy for 1 month with continuation of Plavix/Xarelto for at least 6 months in this patient on long-term anticoagulation therapy.   )        Anesthesia Quick Evaluation

## 2022-03-12 NOTE — Progress Notes (Signed)
Anesthesia Chart Review:  Follows with cardiology for history of CAD with NSTEMI s/p DES to RCA in 04/2019, persistent atrial fibrillation on Tikosyn and Xarelto, mild to moderate aortic stenosis (mean gradient 15 mmHg by echo 09/2021), PAD s/p prior intervention of left lower extremity, hypertension, hyperlipidemia.  Seen by Sande Rives, PA-C 01/02/2022 via televisit for preop evaluation.  Per note, "Patient has an upcoming umbar transforaminal right L4-L5 planned. She is stable from a cardiac standpoint with no angina, significant shortness of breath, acute CHF symptoms, palpitations, dizziness, syncope. Per Revised Cardiac Risk Index, considered Class II Risk with 6.0% 30-day risk of death, MI, or cardiac arrest given history of CAD with history of MI. She is well optimized from a cardiac standpoint and able to complete >4.0 METS of activity without any significant issues. Therefore, based on ACC/AHA guidelines, patient would be at acceptable risk for the planned procedure without further cardiovascular testing. Per Pharmacy and office protocol, patient can hold Xarelto for 3 day prior to procedure. Please restart this as soon as safely possible postoperatively. Patient is also on Plavix; however, per Dr. Nelly Laurence last note this is for his prior lower extremity intervention and not for a cardiac indication. Therefore, would defer to Vascular Surgery on recommendations for holding Plavix. I will route this recommendation to the requesting party via Epic fax function."  Patient reports last dose Plavix and Xarelto 03/09/2022.  Current every day smoker.,  Non-insulin-dependent DM2, on daily GLP-1 agonist Victoza.  Patient reports last dose 03/13/2022.  A1c 8.0 on preop labs.  History of gastric bypass surgery 2004.  Preop labs reviewed, unremarkable.  EKG 09/06/2021: Bradycardia.  Rate 50.  Low voltage QRS.  TTE 09/11/2021:  1. Left ventricular ejection fraction, by estimation, is 60 to 65%. The  left  ventricle has normal function. The left ventricle has no regional  wall motion abnormalities. Left ventricular diastolic parameters are  indeterminate. The average left  ventricular global longitudinal strain is -24.0 %. The global longitudinal  strain is normal.   2. Right ventricular systolic function is normal. The right ventricular  size is normal.   3. The mitral valve is abnormal. Mild mitral valve regurgitation. No  evidence of mitral stenosis.   4. The aortic valve is tricuspid. There is mild calcification of the  aortic valve. There is mild thickening of the aortic valve. Aortic valve  regurgitation is trivial. Mild to moderate aortic valve stenosis. Aortic  valve mean gradient measures 15.0  mmHg. Aortic valve peak gradient measures 26.4 mmHg. Aortic valve area, by  VTI measures 1.23 cm.   5. There is mild dilatation of the ascending aorta, measuring 36 mm.   6. The inferior vena cava is normal in size with greater than 50%  respiratory variability, suggesting right atrial pressure of 3 mmHg.   Comparison(s): Echocardiogram done 04/28/19 showed an EF of 58% with mild  AS and an AV Mean Grad of 7 mmHg.   Cath and PCI 04/27/2019: Prox RCA lesion is 95% stenosed. Post intervention, there is a 0% residual stenosis. A stent was successfully placed.   Acute coronary syndrome secondary to high-grade thrombotic stenosis in the very proximal RCA in a dominant RCA vessel.   Normal left coronary circulation with a short left main, large LAD and left circumflex vessels.   Dominant RCA with 95% very proximal stenosis with thrombus burden with TIMI-3 flow.   Successful percutaneous coronary prevention with PTCA and ultimate stenting with a 3.0 x18 mm Resolute Onyx DES  stent postdilated to 3.73 mm tapering to 3.68 mm with the percent stenosis being reduced to 0% and brisk TIMI-3 flow without evidence for dissection.   RECOMMENDATION: Consider initial triple drug therapy for 1 month with  continuation of Plavix/Xarelto for at least 6 months in this patient on long-term anticoagulation therapy.    Wynonia Musty [facility] Stay [facility]/Anesthesiology Phone [phone] 03/12/2022 3:51 PM

## 2022-03-13 ENCOUNTER — Ambulatory Visit: Payer: Medicare HMO | Attending: Cardiology | Admitting: Cardiology

## 2022-03-13 ENCOUNTER — Encounter: Payer: Self-pay | Admitting: Cardiology

## 2022-03-13 VITALS — BP 125/65 | HR 50 | Ht 64.0 in | Wt 273.4 lb

## 2022-03-13 DIAGNOSIS — I2510 Atherosclerotic heart disease of native coronary artery without angina pectoris: Secondary | ICD-10-CM | POA: Diagnosis not present

## 2022-03-13 DIAGNOSIS — I10 Essential (primary) hypertension: Secondary | ICD-10-CM

## 2022-03-13 DIAGNOSIS — R600 Localized edema: Secondary | ICD-10-CM | POA: Diagnosis not present

## 2022-03-13 DIAGNOSIS — I4891 Unspecified atrial fibrillation: Secondary | ICD-10-CM

## 2022-03-13 NOTE — Patient Instructions (Signed)

## 2022-03-13 NOTE — Progress Notes (Signed)
Clinical Summary Amy Tran is a 67 y.o.female seen today for follow up of the following medical problems.    1.CAD -  history of DES to RCA 04/2019 in setting of NSTEMI - 04/2019 echo: LVEF 55-60%, no WMAs   -She is on plavix for recent lower extremity intervention,remains on xarelto for afib   - no chest pains. No SOB/DOE - compliant with meds. Off plavix for back surgery       2.Aortic stenosis -04/2019 echo mean grad 16, AVA VTI 1.4 - - 09/2021 echo: mild to mod AS mean grad 15, AVA VTI 1.2   3.Afib - followed by EP, has been on dofetilide - no recent palpitations - no recent presyncope, or syncope   - no recent palpitations - compliant with meds. No bleeding on xarelto.  - from recent admission with sepsis EKGs did show rate controlled afib  - occasoinal palpitation, mild and infrequent.    4.PAD - followed by vascular - 09/2020 balloon  left PT, mechanical thrombectomy prox left popliteal, balloon left pop, stent prox left pop     5. Fatigue - chronic generalized fatigue per pcp       6. Abnormal PFTs - followed by pulmonary Dr Halford Chessman. Started on inhalers recently after abnormal PFTs thought to be asthma       7. LE edema - 2021 echo LVEF 35-45, indet diastolic function. Prior DVT left leg - has been evaluated by vascular -likely related to obesity, perhaps venous insufficiency. Previous echo fairly mild findings     - compliant with diuretic - has not been compilant with home compression pump    09/2021 echo: LVEF 60-65%, no WMAs, indet diastolic fxn, normal RV fxn, mild MR   8. Hyperlipidemia - upcoming labs with pcp - labs follower by pcp   9. HTN - compliant with meds    Past Medical History:  Diagnosis Date   Aortic stenosis    Arthritis    Asthma    Cancer ([place])    multiple skin cancers   Coronary artery disease    Diabetes mellitus without complication ([place])    Diverticulosis    Dysrhythmia    Fluid retention    GERD  (gastroesophageal reflux disease)    High cholesterol    Hyperlipidemia    Hypertension    Incomplete rotator cuff tear    Myocardial infarction ([place])    Obesity    Osteoporosis    PAD (peripheral artery disease) ([facility])    Persistent atrial fibrillation ([facility])    PONV (postoperative nausea and vomiting)    Wears glasses    Wears partial dentures    bottom     Allergies  Allergen Reactions   Penicillins Hives and Rash    Has patient had a PCN reaction causing immediate rash, facial/tongue/throat swelling, SOB or lightheadedness with hypotension: Yes Has patient had a PCN reaction causing severe rash involving mucus membranes or skin necrosis: No Has patient had a PCN reaction that required hospitalization No Has patient had a PCN reaction occurring within the last 10 years: No If all of the above answers are "NO", then may proceed with Cephalosporin use.    Doxycycline Itching     Current Outpatient Medications  Medication Sig Dispense Refill   albuterol (VENTOLIN HFA) 108 (90 Base) MCG/ACT inhaler Inhale 2 puffs into the lungs every 6 (six) hours as needed for wheezing or shortness of breath.     alendronate (FOSAMAX) 70 MG tablet  Take 70 mg by mouth every Sunday.     atorvastatin (LIPITOR) 80 MG tablet Take 1 tablet (80 mg total) by mouth daily. 90 tablet 3   BD PEN NEEDLE NANO U/F 32G X 4 MM MISC USE FOR VICTOZA INJECTIONS     Cholecalciferol (VITAMIN D) 50 MCG (2000 UT) CAPS Take 2,000 Units by mouth daily.     clopidogrel (PLAVIX) 75 MG tablet Take 1 tablet (75 mg total) by mouth daily with breakfast. 90 tablet 3   cyanocobalamin (VITAMIN B12) 500 MCG tablet Take 500 mcg by mouth daily.     dofetilide (TIKOSYN) 250 MCG capsule Take 1 capsule (250 mcg total) by mouth 2 (two) times daily. 180 capsule 3   fluticasone (FLONASE) 50 MCG/ACT nasal spray Place 2 sprays into both nostrils daily as needed for rhinitis (congestion/ dryness).     glipiZIDE (GLUCOTROL XL) 5 MG 24 hr  tablet Take 5 mg by mouth every morning.     HYDROcodone-acetaminophen (NORCO/VICODIN) 5-325 MG tablet Take 1 tablet by mouth every 6 (six) hours as needed. (Patient not taking: Reported on 03/07/2022) 10 tablet 0   JARDIANCE 10 MG TABS tablet Take 10 mg by mouth daily.     Lancets (ONETOUCH DELICA PLUS [id number]) MISC 1 each daily.     losartan (COZAAR) 100 MG tablet Take 100 mg by mouth daily with breakfast.      nitroGLYCERIN (NITROSTAT) 0.4 MG SL tablet Place 1 tablet (0.4 mg total) under the tongue every 5 (five) minutes as needed for chest pain. 30 tablet 0   potassium chloride SA (KLOR-CON) 20 MEQ tablet Take 1 tablet (20 mEq total) by mouth 2 (two) times daily. (Patient taking differently: Take 20 mEq by mouth in the morning, at noon, and at bedtime.)     rivaroxaban (XARELTO) 20 MG TABS tablet Take 1 tablet (20 mg total) by mouth daily with supper. (Patient taking differently: Take 20 mg by mouth at bedtime.) 90 tablet 1   Skin Protectants, Misc. (EUCERIN) cream Apply 1 Application topically daily.     spironolactone (ALDACTONE) 25 MG tablet Take 25 mg by mouth daily with breakfast.      torsemide (DEMADEX) 20 MG tablet Take 60 mg by mouth daily after breakfast.      VICTOZA 18 MG/3ML SOPN Inject 1.8 mg into the skin at bedtime.      No current facility-administered medications for this visit.     Past Surgical History:  Procedure Laterality Date   APPENDECTOMY     CARDIOVERSION N/A 06/03/2019   Procedure: CARDIOVERSION;  Surgeon: Herminio Commons, MD;  Location: [facility];  Service: Cardiovascular;  Laterality: N/A;   CARDIOVERSION N/A 07/13/2019   Procedure: CARDIOVERSION;  Surgeon: Geralynn Rile, MD;  Location: [place];  Service: Cardiovascular;  Laterality: N/A;   CARPAL TUNNEL RELEASE Bilateral 2001   bilateral   CERVICAL POLYPECTOMY  04/08/2017   Procedure: POLYPECTOMY;  Surgeon: [name], MD;  Location: [facility];  Service: Gynecology;;  Endometrial    CHOLECYSTECTOMY     COLONOSCOPY  2007   POE:UMPNTIR internal hemorrhoids. Diminutive rectal polyp at 10 cm, cold biopsied/removed. The remainder of the rectal mucosa appeared normal Swallow left-sided diverticula. Diminutive polyp at the splenic flexure cold biopsied/removed (adenomatous)   COLONOSCOPY N/A 12/28/2013   Procedure: COLONOSCOPY;  Surgeon: Daneil Dolin, MD;  [facility];  Service: Endoscopy;  Laterality: N/A;  730 - moved to 8:30 - Ginger notified pt   CORONARY STENT  INTERVENTION N/A 04/27/2019   Procedure: CORONARY STENT INTERVENTION;  Surgeon: Troy Sine, MD;  Location: [facility];  Service: Cardiovascular;  Laterality: N/A;   DILATION AND CURETTAGE, DIAGNOSTIC / THERAPEUTIC  03/07/2017   ERCP  2002   GASTRIC BYPASS  2004   HYSTEROSCOPY WITH D & C N/A 04/08/2017   Procedure: DILATATION AND CURETTAGE /HYSTEROSCOPY;  Surgeon: [name], MD;  Location: [facility];  Service: Gynecology;  Laterality: N/A;   INTRAVASCULAR ULTRASOUND/IVUS N/A 09/12/2017   Procedure: INTRAVASCULAR ULTRASOUND/IVUS;  Surgeon: Elam Dutch, MD;  Location: [facility];  Service: Cardiovascular;  Laterality: N/A;   LEFT HEART CATH AND CORONARY ANGIOGRAPHY N/A 04/27/2019   Procedure: LEFT HEART CATH AND CORONARY ANGIOGRAPHY;  Surgeon: Troy Sine, MD;  Location: [place] [facility];  Service: Cardiovascular;  Laterality: N/A;   LOWER EXTREMITY ANGIOGRAPHY Left 10/02/2020   Procedure: LOWER EXTREMITY ANGIOGRAPHY;  Surgeon: Algernon Huxley, MD;  Location: [facility];  Service: Cardiovascular;  Laterality: Left;   NECK SURGERY  1998   fusion   PERCUTANEOUS VENOUS THROMBECTOMY,LYSIS WITH INTRAVASCULAR ULTRASOUND (IVUS) Left 09/13/2017   Procedure: REMOVAL OF LYSIS CATHETER AND INTRAVASCULAR ULTRASOUND (IVUS) LEFT ILIAC VEIN;  Surgeon: Elam Dutch, MD;  Location: [facility];  Service: Vascular;  Laterality: Left;   PERIPHERAL VASCULAR INTERVENTION  09/15/2017   Procedure:  PERIPHERAL VASCULAR INTERVENTION;  Surgeon: Waynetta Sandy, MD;  Location: [facility];  Service: Cardiovascular;;  VEINOUS/STENT   PERIPHERAL VASCULAR THROMBECTOMY Left 09/12/2017   Procedure: PERIPHERAL VASCULAR THROMBECTOMY;  Surgeon: Elam Dutch, MD;  Location: [facility];  Service: Cardiovascular;  Laterality: Left;   PERIPHERAL VASCULAR THROMBECTOMY Left 09/15/2017   Procedure: PERIPHERAL VASCULAR THROMBECTOMY;  Surgeon: Waynetta Sandy, MD;  Location: [facility];  Service: Cardiovascular;  Laterality: Left;  IVC TO LT FEM/POP VEIN   SHOULDER ARTHROSCOPY WITH SUBACROMIAL DECOMPRESSION Left 06/23/2013   Procedure: LEFT SHOULDER ARTHROSCOPY WITH DEBRIDEMENT ROTATOR CUFF AND LABRUM;  Surgeon: Lorn Junes, MD;  Location: [place];  Service: Orthopedics;  Laterality: Left;   SHOULDER SURGERY  1999   left     Allergies  Allergen Reactions   Penicillins Hives and Rash    Has patient had a PCN reaction causing immediate rash, facial/tongue/throat swelling, SOB or lightheadedness with hypotension: Yes Has patient had a PCN reaction causing severe rash involving mucus membranes or skin necrosis: No Has patient had a PCN reaction that required hospitalization No Has patient had a PCN reaction occurring within the last 10 years: No If all of the above answers are "NO", then may proceed with Cephalosporin use.    Doxycycline Itching      Family History  Problem Relation Age of Onset   Diabetes Father    Hypertension Father    Parkinson's disease Father    Cancer Father        not sure what kind   Hypertension Mother    Heart disease Mother    Drug abuse Sister    Mesothelioma Brother    Cancer Daughter        non-hodgkins lmphoma   Hypertension Brother    Diabetes Brother    Colon cancer Neg Hx    Gastric cancer Neg Hx    Esophageal cancer Neg Hx      Social History Ms. Westervelt reports that she has been smoking  cigarettes. She started smoking about 51 years ago. She  has a 43.50 pack-year smoking history. She has never used smokeless tobacco. Ms. Skufca reports that she does not currently use alcohol.   Review of Systems CONSTITUTIONAL: No weight loss, fever, chills, weakness or fatigue.  HEENT: Eyes: No visual loss, blurred vision, double vision or yellow sclerae.No hearing loss, sneezing, congestion, runny nose or sore throat.  SKIN: No rash or itching.  CARDIOVASCULAR: per hpi RESPIRATORY: No shortness of breath, cough or sputum.  GASTROINTESTINAL: No anorexia, nausea, vomiting or diarrhea. No abdominal pain or blood.  GENITOURINARY: No burning on urination, no polyuria NEUROLOGICAL: No headache, dizziness, syncope, paralysis, ataxia, numbness or tingling in the extremities. No change in bowel or bladder control.  MUSCULOSKELETAL: No muscle, back pain, joint pain or stiffness.  LYMPHATICS: No enlarged nodes. No history of splenectomy.  PSYCHIATRIC: No history of depression or anxiety.  ENDOCRINOLOGIC: No reports of sweating, cold or heat intolerance. No polyuria or polydipsia.  Marland Kitchen   Physical Examination Today's Vitals   03/13/22 1426  BP: (!) 140/76  Pulse: (!) 50  SpO2: 98%  Weight: 273 lb 6.4 oz (124 kg)  Height: '5\' 4"'$  (1.626 m)   Body mass index is 46.93 kg/m.  Gen: resting comfortably, no acute distress HEENT: no scleral icterus, pupils equal round and reactive, no palptable cervical adenopathy,  CV: RRR, no mrg, no jvd Resp: Clear to auscultation bilaterally GI: abdomen is soft, non-tender, non-distended, normal bowel sounds, no hepatosplenomegaly MSK: extremities are warm, 1+ bilatearl nonpitting edema Skin: warm, no rash Neuro:  no focal deficits Psych: appropriate affect   Diagnostic Studies  01/2016 echo Study Conclusions   - Left ventricle: The cavity size was mildly dilated. Wall   thickness was normal. Systolic function was normal. The estimated   ejection  fraction was in the range of 60% to 65%. The study is   not technically sufficient to allow evaluation of LV diastolic   function. - Pulmonary arteries: PA peak pressure: 32 mm Hg (S). - Pericardium, extracardiac: A trivial pericardial effusion was   identified.   04/2016 Lexiscan There was no ST segment deviation noted during stress. The study is normal. There are no perfusion defects consistent with prior infarct or current ischemia. Anterior fixed defect likely causes by breast attenuation. This is a low risk study. The left ventricular ejection fraction is normal (55-65%).     04/2019 cath Prox RCA lesion is 95% stenosed. Post intervention, there is a 0% residual stenosis. A stent was successfully placed.   Acute coronary syndrome secondary to high-grade thrombotic stenosis in the very proximal RCA in a dominant RCA vessel.   Normal left coronary circulation with a short left main, large LAD and left circumflex vessels.   Dominant RCA with 95% very proximal stenosis with thrombus burden with TIMI-3 flow.   Successful percutaneous coronary prevention with PTCA and ultimate stenting with a 3.0 x18 mm Resolute Onyx DES stent postdilated to 3.73 mm tapering to 3.68 mm with the percent stenosis being reduced to 0% and brisk TIMI-3 flow without evidence for dissection.   RECOMMENDATION: Consider initial triple drug therapy for 1 month with continuation of Plavix/Xarelto for at least 6 months in this patient on long-term anticoagulation therapy.     08/2018 sleep study Mild obstructive sleep apnea is documented with this recording.  The severity does not require positive pressure treatment.   09/2021 echo IMPRESSIONS     1. Left ventricular ejection fraction, by estimation, is 60 to 65%. The  left ventricle has normal function.  The left ventricle has no regional  wall motion abnormalities. Left ventricular diastolic parameters are  indeterminate. The average left  ventricular  global longitudinal strain is -24.0 %. The global longitudinal  strain is normal.   2. Right ventricular systolic function is normal. The right ventricular  size is normal.   3. The mitral valve is abnormal. Mild mitral valve regurgitation. No  evidence of mitral stenosis.   4. The aortic valve is tricuspid. There is mild calcification of the  aortic valve. There is mild thickening of the aortic valve. Aortic valve  regurgitation is trivial. Mild to moderate aortic valve stenosis. Aortic  valve mean gradient measures 15.0  mmHg. Aortic valve peak gradient measures 26.4 mmHg. Aortic valve area, by  VTI measures 1.23 cm.   5. There is mild dilatation of the ascending aorta, measuring 36 mm.   6. The inferior vena cava is normal in size with greater than 50%  respiratory variability, suggesting right atrial pressure of 3 mmHg.     Assessment and Plan  1.CAD - on xarelto for afib and plavix for recent lower extremity intervention, not for cardiac indication - no beta blocker due to bradycardia - no recent symptoms, continue current meds     2. HTN - bp at goal by manual recheck, continue current meds   3. LE edema - likely related to obesity and venous insufficiency, fairly benign echo  - continue current treatmen.    4. Afib - did have recurrence during prior admission with sepsis likely driven by systemic illness, rates were controlled. Otherwise has done well on dofetilide.  - no symptoms, continue current meds   F/u 6 months      Arnoldo Lenis, M.D

## 2022-03-14 MED ORDER — VANCOMYCIN HCL 1500 MG/300ML IV SOLN
1500.0000 mg | INTRAVENOUS | Status: AC
  Administered 2022-03-15: 1500 mg via INTRAVENOUS
  Filled 2022-03-14: qty 300

## 2022-03-15 ENCOUNTER — Ambulatory Visit (HOSPITAL_COMMUNITY): Payer: Medicare HMO | Admitting: Physician Assistant

## 2022-03-15 ENCOUNTER — Other Ambulatory Visit: Payer: Self-pay

## 2022-03-15 ENCOUNTER — Ambulatory Visit (HOSPITAL_COMMUNITY)
Admission: RE | Disposition: A | Discharge status: Home / Self Care | Payer: Self-pay | Source: Home / Self Care | Attending: Neurosurgery

## 2022-03-15 ENCOUNTER — Observation Stay (HOSPITAL_COMMUNITY)
Admission: RE | Admit: 2022-03-15 | Discharge: 2022-03-16 | Disposition: A | Discharge status: Home / Self Care | Payer: Medicare HMO | Attending: Neurosurgery | Admitting: Neurosurgery

## 2022-03-15 ENCOUNTER — Ambulatory Visit (HOSPITAL_BASED_OUTPATIENT_CLINIC_OR_DEPARTMENT_OTHER): Payer: Medicare HMO | Admitting: Physician Assistant

## 2022-03-15 ENCOUNTER — Ambulatory Visit (HOSPITAL_COMMUNITY): Payer: Medicare HMO

## 2022-03-15 ENCOUNTER — Encounter (HOSPITAL_COMMUNITY): Payer: Self-pay | Admitting: Neurosurgery

## 2022-03-15 DIAGNOSIS — Z85828 Personal history of other malignant neoplasm of skin: Secondary | ICD-10-CM | POA: Insufficient documentation

## 2022-03-15 DIAGNOSIS — Z955 Presence of coronary angioplasty implant and graft: Secondary | ICD-10-CM | POA: Insufficient documentation

## 2022-03-15 DIAGNOSIS — F17210 Nicotine dependence, cigarettes, uncomplicated: Secondary | ICD-10-CM | POA: Insufficient documentation

## 2022-03-15 DIAGNOSIS — E119 Type 2 diabetes mellitus without complications: Secondary | ICD-10-CM | POA: Diagnosis not present

## 2022-03-15 DIAGNOSIS — I2510 Atherosclerotic heart disease of native coronary artery without angina pectoris: Secondary | ICD-10-CM

## 2022-03-15 DIAGNOSIS — E1151 Type 2 diabetes mellitus with diabetic peripheral angiopathy without gangrene: Secondary | ICD-10-CM | POA: Diagnosis not present

## 2022-03-15 DIAGNOSIS — Z7902 Long term (current) use of antithrombotics/antiplatelets: Secondary | ICD-10-CM | POA: Diagnosis not present

## 2022-03-15 DIAGNOSIS — Z7901 Long term (current) use of anticoagulants: Secondary | ICD-10-CM | POA: Diagnosis not present

## 2022-03-15 DIAGNOSIS — I252 Old myocardial infarction: Secondary | ICD-10-CM | POA: Diagnosis not present

## 2022-03-15 DIAGNOSIS — Z981 Arthrodesis status: Secondary | ICD-10-CM | POA: Diagnosis not present

## 2022-03-15 DIAGNOSIS — M5416 Radiculopathy, lumbar region: Secondary | ICD-10-CM | POA: Diagnosis not present

## 2022-03-15 DIAGNOSIS — J45909 Unspecified asthma, uncomplicated: Secondary | ICD-10-CM | POA: Diagnosis not present

## 2022-03-15 DIAGNOSIS — M5116 Intervertebral disc disorders with radiculopathy, lumbar region: Secondary | ICD-10-CM | POA: Diagnosis not present

## 2022-03-15 DIAGNOSIS — M5126 Other intervertebral disc displacement, lumbar region: Principal | ICD-10-CM | POA: Insufficient documentation

## 2022-03-15 DIAGNOSIS — Z79899 Other long term (current) drug therapy: Secondary | ICD-10-CM | POA: Diagnosis not present

## 2022-03-15 DIAGNOSIS — I10 Essential (primary) hypertension: Secondary | ICD-10-CM | POA: Diagnosis not present

## 2022-03-15 DIAGNOSIS — Z7984 Long term (current) use of oral hypoglycemic drugs: Secondary | ICD-10-CM

## 2022-03-15 DIAGNOSIS — F17200 Nicotine dependence, unspecified, uncomplicated: Secondary | ICD-10-CM | POA: Diagnosis not present

## 2022-03-15 DIAGNOSIS — E1165 Type 2 diabetes mellitus with hyperglycemia: Secondary | ICD-10-CM

## 2022-03-15 HISTORY — PX: LUMBAR LAMINECTOMY/DECOMPRESSION MICRODISCECTOMY: SHX5026

## 2022-03-15 LAB — GLUCOSE, CAPILLARY
Glucose-Capillary: 195 mg/dL — ABNORMAL HIGH (ref 70–99)
Glucose-Capillary: 160 mg/dL — ABNORMAL HIGH (ref 70–99)
Glucose-Capillary: 181 mg/dL — ABNORMAL HIGH (ref 70–99)
Glucose-Capillary: 456 mg/dL — ABNORMAL HIGH (ref 70–99)
Glucose-Capillary: 315 mg/dL — ABNORMAL HIGH (ref 70–99)

## 2022-03-15 SURGERY — LUMBAR LAMINECTOMY/DECOMPRESSION MICRODISCECTOMY 1 LEVEL
Anesthesia: General | Site: Back | Laterality: Right

## 2022-03-15 MED ORDER — ONDANSETRON HCL 4 MG PO TABS: 4.0000 mg | ORAL_TABLET | Freq: Four times a day (QID) | ORAL | Status: DC | PRN

## 2022-03-15 MED ORDER — EPHEDRINE SULFATE-NACL 50-0.9 MG/10ML-% IV SOSY
PREFILLED_SYRINGE | INTRAVENOUS | Status: DC | PRN
  Administered 2022-03-15: 10 mg via INTRAVENOUS

## 2022-03-15 MED ORDER — THROMBIN 5000 UNITS EX SOLR
CUTANEOUS | Status: AC
  Filled 2022-03-15: qty 10000

## 2022-03-15 MED ORDER — ACETAMINOPHEN 325 MG PO TABS: 650.0000 mg | ORAL_TABLET | ORAL | Status: DC | PRN

## 2022-03-15 MED ORDER — TORSEMIDE 20 MG PO TABS: 60.0000 mg | ORAL_TABLET | Freq: Every day | ORAL | Status: DC

## 2022-03-15 MED ORDER — EMPAGLIFLOZIN 10 MG PO TABS: 10.0000 mg | ORAL_TABLET | Freq: Every day | ORAL | Status: DC

## 2022-03-15 MED ORDER — PHENYLEPHRINE HCL-NACL 20-0.9 MG/250ML-% IV SOLN
INTRAVENOUS | Status: DC | PRN
  Administered 2022-03-15: 25 ug/min via INTRAVENOUS

## 2022-03-15 MED ORDER — KETOROLAC TROMETHAMINE 30 MG/ML IJ SOLN
INTRAMUSCULAR | Status: AC
  Filled 2022-03-15: qty 1

## 2022-03-15 MED ORDER — 0.9 % SODIUM CHLORIDE (POUR BTL) OPTIME
TOPICAL | Status: DC | PRN
  Administered 2022-03-15: 1000 mL

## 2022-03-15 MED ORDER — GLYCOPYRROLATE PF 0.2 MG/ML IJ SOSY
PREFILLED_SYRINGE | INTRAMUSCULAR | Status: AC
  Filled 2022-03-15: qty 1

## 2022-03-15 MED ORDER — ORAL CARE MOUTH RINSE: 15.0000 mL | Freq: Once | OROMUCOSAL | Status: AC

## 2022-03-15 MED ORDER — FENTANYL CITRATE (PF) 250 MCG/5ML IJ SOLN
INTRAMUSCULAR | Status: AC
  Filled 2022-03-15: qty 5

## 2022-03-15 MED ORDER — DEXAMETHASONE SODIUM PHOSPHATE 10 MG/ML IJ SOLN
INTRAMUSCULAR | Status: AC
  Filled 2022-03-15: qty 1

## 2022-03-15 MED ORDER — NITROGLYCERIN 0.4 MG SL SUBL: 0.4000 mg | SUBLINGUAL_TABLET | SUBLINGUAL | Status: DC | PRN

## 2022-03-15 MED ORDER — SODIUM CHLORIDE 0.9% FLUSH: 3.0000 mL | INTRAVENOUS | Status: DC | PRN

## 2022-03-15 MED ORDER — SODIUM CHLORIDE 0.9% FLUSH
3.0000 mL | Freq: Two times a day (BID) | INTRAVENOUS | Status: DC
  Administered 2022-03-15 (×2): 3 mL via INTRAVENOUS

## 2022-03-15 MED ORDER — INSULIN ASPART 100 UNIT/ML IJ SOLN
0.0000 [IU] | Freq: Three times a day (TID) | INTRAMUSCULAR | Status: DC
  Administered 2022-03-15: 7 [IU] via SUBCUTANEOUS

## 2022-03-15 MED ORDER — HYDROCODONE-ACETAMINOPHEN 5-325 MG PO TABS: 1.0000 | ORAL_TABLET | ORAL | Status: DC | PRN

## 2022-03-15 MED ORDER — THROMBIN (RECOMBINANT) 5000 UNITS EX SOLR
CUTANEOUS | Status: DC | PRN
  Administered 2022-03-15: 10 mL via TOPICAL

## 2022-03-15 MED ORDER — VITAMIN B-12 1000 MCG PO TABS
500.0000 ug | ORAL_TABLET | Freq: Every day | ORAL | Status: DC
  Administered 2022-03-15: 500 ug via ORAL
  Filled 2022-03-15: qty 1

## 2022-03-15 MED ORDER — LIDOCAINE 2% (20 MG/ML) 5 ML SYRINGE
INTRAMUSCULAR | Status: AC
  Filled 2022-03-15: qty 5

## 2022-03-15 MED ORDER — ACETAMINOPHEN 650 MG RE SUPP: 650.0000 mg | RECTAL | Status: DC | PRN

## 2022-03-15 MED ORDER — ONDANSETRON HCL 4 MG/2ML IJ SOLN: 4.0000 mg | Freq: Four times a day (QID) | INTRAMUSCULAR | Status: DC | PRN

## 2022-03-15 MED ORDER — INSULIN ASPART 100 UNIT/ML IJ SOLN: 0.0000 [IU] | Freq: Three times a day (TID) | INTRAMUSCULAR | Status: DC

## 2022-03-15 MED ORDER — MENTHOL 3 MG MT LOZG: 1.0000 | LOZENGE | OROMUCOSAL | Status: DC | PRN

## 2022-03-15 MED ORDER — MIDAZOLAM HCL 2 MG/2ML IJ SOLN
INTRAMUSCULAR | Status: AC
  Filled 2022-03-15: qty 2

## 2022-03-15 MED ORDER — ROCURONIUM BROMIDE 10 MG/ML (PF) SYRINGE
PREFILLED_SYRINGE | INTRAVENOUS | Status: AC
  Filled 2022-03-15: qty 10

## 2022-03-15 MED ORDER — INSULIN ASPART 100 UNIT/ML IJ SOLN
0.0000 [IU] | Freq: Three times a day (TID) | INTRAMUSCULAR | Status: DC
  Administered 2022-03-15: 20 [IU] via SUBCUTANEOUS

## 2022-03-15 MED ORDER — SODIUM CHLORIDE 0.9 % IV SOLN
250.0000 mL | INTRAVENOUS | Status: DC
  Administered 2022-03-15: 250 mL via INTRAVENOUS

## 2022-03-15 MED ORDER — HYDROCODONE-ACETAMINOPHEN 10-325 MG PO TABS
1.0000 | ORAL_TABLET | ORAL | Status: DC | PRN
  Administered 2022-03-15 – 2022-03-16 (×3): 1 via ORAL
  Filled 2022-03-15 (×3): qty 1

## 2022-03-15 MED ORDER — ALBUTEROL SULFATE (2.5 MG/3ML) 0.083% IN NEBU: 3.0000 mL | INHALATION_SOLUTION | Freq: Four times a day (QID) | RESPIRATORY_TRACT | Status: DC | PRN

## 2022-03-15 MED ORDER — ATORVASTATIN CALCIUM 80 MG PO TABS
80.0000 mg | ORAL_TABLET | Freq: Every day | ORAL | Status: DC
  Administered 2022-03-15: 80 mg via ORAL
  Filled 2022-03-15: qty 1

## 2022-03-15 MED ORDER — SUGAMMADEX SODIUM 200 MG/2ML IV SOLN
INTRAVENOUS | Status: DC | PRN
  Administered 2022-03-15: 500 mg via INTRAVENOUS
  Administered 2022-03-15: 200 mg via INTRAVENOUS

## 2022-03-15 MED ORDER — PHENOL 1.4 % MT LIQD: 1.0000 | OROMUCOSAL | Status: DC | PRN

## 2022-03-15 MED ORDER — ONDANSETRON HCL 4 MG/2ML IJ SOLN
INTRAMUSCULAR | Status: AC
  Filled 2022-03-15: qty 2

## 2022-03-15 MED ORDER — LIDOCAINE 2% (20 MG/ML) 5 ML SYRINGE
INTRAMUSCULAR | Status: DC | PRN
  Administered 2022-03-15: 60 mg via INTRAVENOUS

## 2022-03-15 MED ORDER — INSULIN ASPART 100 UNIT/ML IJ SOLN
0.0000 [IU] | Freq: Three times a day (TID) | INTRAMUSCULAR | Status: DC
  Administered 2022-03-16: 3 [IU] via SUBCUTANEOUS

## 2022-03-15 MED ORDER — LACTATED RINGERS IV SOLN: INTRAVENOUS | Status: DC

## 2022-03-15 MED ORDER — ACETAMINOPHEN 500 MG PO TABS
1000.0000 mg | ORAL_TABLET | Freq: Once | ORAL | Status: AC
  Administered 2022-03-15: 1000 mg via ORAL
  Filled 2022-03-15: qty 2

## 2022-03-15 MED ORDER — HYDROMORPHONE HCL 1 MG/ML IJ SOLN: 1.0000 mg | INTRAMUSCULAR | Status: DC | PRN

## 2022-03-15 MED ORDER — VANCOMYCIN HCL IN DEXTROSE 1-5 GM/200ML-% IV SOLN
1000.0000 mg | Freq: Once | INTRAVENOUS | Status: AC
  Administered 2022-03-15: 1000 mg via INTRAVENOUS
  Filled 2022-03-15: qty 200

## 2022-03-15 MED ORDER — ONDANSETRON HCL 4 MG/2ML IJ SOLN
INTRAMUSCULAR | Status: DC | PRN
  Administered 2022-03-15: 4 mg via INTRAVENOUS

## 2022-03-15 MED ORDER — HYDROMORPHONE HCL 1 MG/ML IJ SOLN
0.2500 mg | INTRAMUSCULAR | Status: DC | PRN
  Administered 2022-03-15: 0.25 mg via INTRAVENOUS

## 2022-03-15 MED ORDER — CHLORHEXIDINE GLUCONATE CLOTH 2 % EX PADS: 6.0000 | MEDICATED_PAD | Freq: Once | CUTANEOUS | Status: DC

## 2022-03-15 MED ORDER — FENTANYL CITRATE (PF) 250 MCG/5ML IJ SOLN
INTRAMUSCULAR | Status: DC | PRN
  Administered 2022-03-15: 50 ug via INTRAVENOUS
  Administered 2022-03-15: 100 ug via INTRAVENOUS
  Administered 2022-03-15: 50 ug via INTRAVENOUS
  Administered 2022-03-15: 100 ug via INTRAVENOUS

## 2022-03-15 MED ORDER — KETOROLAC TROMETHAMINE 15 MG/ML IJ SOLN
15.0000 mg | Freq: Four times a day (QID) | INTRAMUSCULAR | Status: AC
  Administered 2022-03-15 – 2022-03-16 (×4): 15 mg via INTRAVENOUS
  Filled 2022-03-15 (×4): qty 1

## 2022-03-15 MED ORDER — CYCLOBENZAPRINE HCL 10 MG PO TABS
10.0000 mg | ORAL_TABLET | Freq: Three times a day (TID) | ORAL | Status: DC | PRN
  Administered 2022-03-15: 10 mg via ORAL
  Filled 2022-03-15: qty 1

## 2022-03-15 MED ORDER — PROPOFOL 10 MG/ML IV BOLUS
INTRAVENOUS | Status: AC
  Filled 2022-03-15: qty 20

## 2022-03-15 MED ORDER — GLYCOPYRROLATE PF 0.2 MG/ML IJ SOSY
PREFILLED_SYRINGE | INTRAMUSCULAR | Status: DC | PRN
  Administered 2022-03-15: .2 mg via INTRAVENOUS

## 2022-03-15 MED ORDER — FLUTICASONE PROPIONATE 50 MCG/ACT NA SUSP: 2.0000 | Freq: Every day | NASAL | Status: DC | PRN

## 2022-03-15 MED ORDER — GLIPIZIDE ER 5 MG PO TB24
5.0000 mg | ORAL_TABLET | Freq: Every morning | ORAL | Status: DC
  Filled 2022-03-15: qty 1

## 2022-03-15 MED ORDER — LIRAGLUTIDE 18 MG/3ML ~~LOC~~ SOPN: 1.8000 mg | PEN_INJECTOR | Freq: Every day | SUBCUTANEOUS | Status: DC

## 2022-03-15 MED ORDER — POTASSIUM CHLORIDE CRYS ER 20 MEQ PO TBCR
20.0000 meq | EXTENDED_RELEASE_TABLET | Freq: Two times a day (BID) | ORAL | Status: DC
  Administered 2022-03-15 (×2): 20 meq via ORAL
  Filled 2022-03-15 (×2): qty 1

## 2022-03-15 MED ORDER — BUPIVACAINE HCL (PF) 0.25 % IJ SOLN
INTRAMUSCULAR | Status: DC | PRN
  Administered 2022-03-15: 20 mL

## 2022-03-15 MED ORDER — SPIRONOLACTONE 25 MG PO TABS: 25.0000 mg | ORAL_TABLET | Freq: Every day | ORAL | Status: DC

## 2022-03-15 MED ORDER — EPHEDRINE 5 MG/ML INJ
INTRAVENOUS | Status: AC
  Filled 2022-03-15: qty 5

## 2022-03-15 MED ORDER — PROPOFOL 10 MG/ML IV BOLUS
INTRAVENOUS | Status: DC | PRN
  Administered 2022-03-15 (×2): 20 mg via INTRAVENOUS
  Administered 2022-03-15: 50 mg via INTRAVENOUS
  Administered 2022-03-15: 20 mg via INTRAVENOUS

## 2022-03-15 MED ORDER — DEXAMETHASONE SODIUM PHOSPHATE 10 MG/ML IJ SOLN
INTRAMUSCULAR | Status: DC | PRN
  Administered 2022-03-15: 8 mg via INTRAVENOUS

## 2022-03-15 MED ORDER — VITAMIN D 25 MCG (1000 UNIT) PO TABS
2000.0000 [IU] | ORAL_TABLET | Freq: Every day | ORAL | Status: DC
  Administered 2022-03-15: 2000 [IU] via ORAL
  Filled 2022-03-15: qty 2

## 2022-03-15 MED ORDER — ROCURONIUM BROMIDE 10 MG/ML (PF) SYRINGE
PREFILLED_SYRINGE | INTRAVENOUS | Status: DC | PRN
  Administered 2022-03-15: 50 mg via INTRAVENOUS
  Administered 2022-03-15: 20 mg via INTRAVENOUS

## 2022-03-15 MED ORDER — KETOROLAC TROMETHAMINE 30 MG/ML IJ SOLN
INTRAMUSCULAR | Status: DC | PRN
  Administered 2022-03-15: 15 mg via INTRAVENOUS

## 2022-03-15 MED ORDER — DOFETILIDE 250 MCG PO CAPS
250.0000 ug | ORAL_CAPSULE | Freq: Two times a day (BID) | ORAL | Status: DC
  Administered 2022-03-15 – 2022-03-16 (×2): 250 ug via ORAL
  Filled 2022-03-15 (×3): qty 1

## 2022-03-15 MED ORDER — BUPIVACAINE HCL (PF) 0.25 % IJ SOLN
INTRAMUSCULAR | Status: AC
  Filled 2022-03-15: qty 30

## 2022-03-15 MED ORDER — SUGAMMADEX SODIUM 500 MG/5ML IV SOLN
INTRAVENOUS | Status: AC
  Filled 2022-03-15: qty 5

## 2022-03-15 MED ORDER — LOSARTAN POTASSIUM 50 MG PO TABS: 100.0000 mg | ORAL_TABLET | Freq: Every day | ORAL | Status: DC

## 2022-03-15 MED ORDER — INSULIN ASPART 100 UNIT/ML IJ SOLN
0.0000 [IU] | INTRAMUSCULAR | Status: AC | PRN
  Administered 2022-03-15: 4 [IU] via SUBCUTANEOUS
  Administered 2022-03-15: 2 [IU] via SUBCUTANEOUS
  Filled 2022-03-15: qty 1

## 2022-03-15 MED ORDER — MIDAZOLAM HCL 2 MG/2ML IJ SOLN
INTRAMUSCULAR | Status: DC | PRN
  Administered 2022-03-15: 2 mg via INTRAVENOUS

## 2022-03-15 MED ORDER — CHLORHEXIDINE GLUCONATE 0.12 % MT SOLN
15.0000 mL | Freq: Once | OROMUCOSAL | Status: AC
  Administered 2022-03-15: 15 mL via OROMUCOSAL
  Filled 2022-03-15: qty 15

## 2022-03-15 MED ORDER — HYDROMORPHONE HCL 1 MG/ML IJ SOLN
INTRAMUSCULAR | Status: AC
  Filled 2022-03-15: qty 1

## 2022-03-15 SURGICAL SUPPLY — 43 items
BAG COUNTER SPONGE SURGICOUNT (BAG) ×1 IMPLANT
BAG DECANTER FOR FLEXI CONT (MISCELLANEOUS) ×1 IMPLANT
BAND RUBBER #18 3X1/16 STRL (MISCELLANEOUS) ×2 IMPLANT
BENZOIN TINCTURE PRP APPL 2/3 (GAUZE/BANDAGES/DRESSINGS) ×1 IMPLANT
BLADE CLIPPER SURG (BLADE) IMPLANT
BUR CUTTER 7.0 ROUND (BURR) ×1 IMPLANT
CANISTER SUCT 3000ML PPV (MISCELLANEOUS) ×1 IMPLANT
DERMABOND ADVANCED .7 DNX12 (GAUZE/BANDAGES/DRESSINGS) ×1 IMPLANT
DRAPE HALF SHEET 40X57 (DRAPES) IMPLANT
DRAPE LAPAROTOMY 100X72X124 (DRAPES) ×1 IMPLANT
DRAPE MICROSCOPE SLANT 54X150 (MISCELLANEOUS) ×1 IMPLANT
DRAPE SURG 17X23 STRL (DRAPES) ×2 IMPLANT
DRSG OPSITE POSTOP 4X6 (GAUZE/BANDAGES/DRESSINGS) IMPLANT
DURAPREP 26ML APPLICATOR (WOUND CARE) ×1 IMPLANT
ELECT REM PT RETURN 9FT ADLT (ELECTROSURGICAL) ×1
ELECTRODE REM PT RTRN 9FT ADLT (ELECTROSURGICAL) ×1 IMPLANT
GAUZE 4X4 16PLY ~~LOC~~+RFID DBL (SPONGE) IMPLANT
GAUZE SPONGE 4X4 12PLY STRL (GAUZE/BANDAGES/DRESSINGS) ×1 IMPLANT
GLOVE BIO SURGEON STRL SZ 6.5 (GLOVE) ×1 IMPLANT
GLOVE BIOGEL PI IND STRL 6.5 (GLOVE) ×1 IMPLANT
GLOVE ECLIPSE 9.0 STRL (GLOVE) ×1 IMPLANT
GLOVE EXAM NITRILE XL STR (GLOVE) IMPLANT
GOWN STRL REUS W/ TWL LRG LVL3 (GOWN DISPOSABLE) IMPLANT
GOWN STRL REUS W/ TWL XL LVL3 (GOWN DISPOSABLE) ×1 IMPLANT
GOWN STRL REUS W/TWL 2XL LVL3 (GOWN DISPOSABLE) IMPLANT
GOWN STRL REUS W/TWL LRG LVL3 (GOWN DISPOSABLE)
GOWN STRL REUS W/TWL XL LVL3 (GOWN DISPOSABLE) ×1
KIT BASIN OR (CUSTOM PROCEDURE TRAY) ×1 IMPLANT
KIT TURNOVER KIT B (KITS) ×1 IMPLANT
NDL SPNL 22GX3.5 QUINCKE BK (NEEDLE) IMPLANT
NEEDLE HYPO 22GX1.5 SAFETY (NEEDLE) ×1 IMPLANT
NEEDLE SPNL 22GX3.5 QUINCKE BK (NEEDLE) IMPLANT
NS IRRIG 1000ML POUR BTL (IV SOLUTION) ×1 IMPLANT
PACK LAMINECTOMY NEURO (CUSTOM PROCEDURE TRAY) ×1 IMPLANT
PAD ARMBOARD 7.5X6 YLW CONV (MISCELLANEOUS) ×3 IMPLANT
SPIKE FLUID TRANSFER (MISCELLANEOUS) ×1 IMPLANT
SPONGE SURGIFOAM ABS GEL SZ50 (HEMOSTASIS) ×1 IMPLANT
STRIP CLOSURE SKIN 1/2X4 (GAUZE/BANDAGES/DRESSINGS) ×1 IMPLANT
SUT VIC AB 2-0 CT1 18 (SUTURE) ×1 IMPLANT
SUT VIC AB 3-0 SH 8-18 (SUTURE) ×1 IMPLANT
TOWEL GREEN STERILE (TOWEL DISPOSABLE) ×1 IMPLANT
TOWEL GREEN STERILE FF (TOWEL DISPOSABLE) ×1 IMPLANT
WATER STERILE IRR 1000ML POUR (IV SOLUTION) ×1 IMPLANT

## 2022-03-15 NOTE — Anesthesia Procedure Notes (Signed)
Procedure Name: Intubation Date/Time: 03/15/2022 8:37 AM  Performed by: Betha Loa, CRNAPre-anesthesia Checklist: Patient identified, Emergency Drugs available, Suction available and Patient being monitored Patient Re-evaluated:Patient Re-evaluated prior to induction Oxygen Delivery Method: Circle System Utilized Preoxygenation: Pre-oxygenation with 100% oxygen Induction Type: IV induction Ventilation: Mask ventilation without difficulty Laryngoscope Size: Mac and 3 Grade View: Grade I Tube type: Oral Tube size: 7.0 mm Number of attempts: 1 Airway Equipment and Method: Stylet and Oral airway Placement Confirmation: ETT inserted through vocal cords under direct vision, positive ETCO2 and breath sounds checked- equal and bilateral Secured at: 22 cm Tube secured with: Tape Dental Injury: Teeth and Oropharynx as per pre-operative assessment  Comments: Stretcher induction--pt preox with 100% fiO2, pts head/neck in position of comfort. Smooth IV induction--easy mask, atraumatic/successful intubation with MAC3. CTM

## 2022-03-15 NOTE — Op Note (Signed)
Date of procedure: 03/15/2022  Date of dictation: Same  Service: Neurosurgery  Preoperative diagnosis: Right L4-5 foraminal extraforaminal herniated nucleus pulposus with radiculopathy  Postoperative diagnosis: Same  Procedure Name: Right L4-5 extraforaminal microdiscectomy  Surgeon:Quetzalli Clos A.Jp Eastham, M.D.  Asst. Surgeon: Ellene Route, MD; Reinaldo Meeker, NP  Anesthesia: General  Indication: 67 year old female with intractable right lower extremity radicular pain consistent with a right-sided L4 radiculopathy which is failed conservative management.  Workup demonstrates evidence of a foraminal/extraforaminal disc protrusion with compression of the right L4 nerve root.  Patient presents now for extraforaminal microdiscectomy in hopes of improving her symptoms.  Operative note: After induction of anesthesia, patient positioned prone onto a Wilson frame and properly padded.  Lumbar region prepped and draped sterilely.  Incision made overlying L4-5.  Dissection performed on the right.  Retractor placed.  X-ray taken.  L5-S1 level exposed.  Dissection redirected 1 level cranially.  Retractor was placed.  Extraforaminal approach was then performed using high-speed drill and Kerrison rongeurs to remove the superior aspect of the superior articular process of L5 the lateral aspect of the L4 pars interarticularis.  Anterior transverse ligament was elevated and resected.  Microscope brought in the field used microdissection of the extraforaminal space.  The right L4 nerve root was identified.  There was obvious disc herniation within the axilla of the nerve root which was removed.  The nerve root was further dissected free.  The disc space was discovered.  The disc base was torn and herniated.  Disc herniation was removed using blunt nerve hooks and pituitary rongeurs.  Almost the disc herniation was resected.  The nerve root was well decompressed.  Hemostasis was achieved with the bipolar electrocautery.  Wound was then  irrigated.  Gelfoam was placed topically for hemostasis.  Wound was then closed in layers with Vicryl sutures.  Steri-Strips and sterile dressing were applied.  No apparent complications.  Patient tolerated the procedure well and she returns to the recovery room postop.

## 2022-03-15 NOTE — Progress Notes (Signed)
Postop check.  Patient doing well postoperatively.  Notes some incisional discomfort but this is well-controlled.  Denies radicular pain numbness or weakness.  Has been up to the bathroom.  Overall doing well following extraforaminal microdiscectomy.  Mobilize through the course of the night and hopefully home tomorrow.

## 2022-03-15 NOTE — H&P (Signed)
Amy Tran is an 67 y.o. female.   Chief Complaint: Back pain HPI: 67 year old female with back pain radiating to her right anterior thigh and anterior medial leg consistent with a right-sided L4 radiculopathy which is failed conservative management.  Workup demonstrates evidence of foraminal stenosis and extraforaminal disc protrusion on the right at L4-5 with L4 nerve root compression.  Patient has failed conservative management presents now for extraforaminal microdiscectomy in hopes of improving her symptoms.  Past Medical History:  Diagnosis Date   Aortic stenosis    Arthritis    Asthma    Cancer ([place])    multiple skin cancers   Coronary artery disease    Diabetes mellitus without complication ([place])    Diverticulosis    Dysrhythmia    Fluid retention    GERD (gastroesophageal reflux disease)    High cholesterol    Hyperlipidemia    Hypertension    Incomplete rotator cuff tear    Myocardial infarction ([place])    Obesity    Osteoporosis    PAD (peripheral artery disease) ([facility])    Persistent atrial fibrillation ([facility])    PONV (postoperative nausea and vomiting)    Wears glasses    Wears partial dentures    bottom    Past Surgical History:  Procedure Laterality Date   APPENDECTOMY     CARDIOVERSION N/A 06/03/2019   Procedure: CARDIOVERSION;  Surgeon: Herminio Commons, MD;  Location: [facility];  Service: Cardiovascular;  Laterality: N/A;   CARDIOVERSION N/A 07/13/2019   Procedure: CARDIOVERSION;  Surgeon: Geralynn Rile, MD;  Location: [facility];  Service: Cardiovascular;  Laterality: N/A;   CARPAL TUNNEL RELEASE Bilateral 2001   bilateral   CERVICAL POLYPECTOMY  04/08/2017   Procedure: POLYPECTOMY;  Surgeon: [name], MD;  Location: [facility];  Service: Gynecology;;  Endometrial   CHOLECYSTECTOMY     COLONOSCOPY  2007   [id number] internal hemorrhoids. Diminutive rectal polyp at 10 cm, cold biopsied/removed. The remainder of the rectal mucosa appeared  normal Swallow left-sided diverticula. Diminutive polyp at the splenic flexure cold biopsied/removed (adenomatous)   COLONOSCOPY N/A 12/28/2013   Procedure: COLONOSCOPY;  Surgeon: Daneil Dolin, MD;  [facility];  Service: Endoscopy;  Laterality: N/A;  730 - moved to 8:30 - Ginger notified pt   CORONARY STENT INTERVENTION N/A 04/27/2019   Procedure: CORONARY STENT INTERVENTION;  Surgeon: Troy Sine, MD;  Location: [facility];  Service: Cardiovascular;  Laterality: N/A;   DILATION AND CURETTAGE, DIAGNOSTIC / THERAPEUTIC  03/07/2017   ERCP  2002   GASTRIC BYPASS  2004   HYSTEROSCOPY WITH D & C N/A 04/08/2017   Procedure: DILATATION AND CURETTAGE /HYSTEROSCOPY;  Surgeon: [name], MD;  Location: [facility];  Service: Gynecology;  Laterality: N/A;   INTRAVASCULAR ULTRASOUND/IVUS N/A 09/12/2017   Procedure: INTRAVASCULAR ULTRASOUND/IVUS;  Surgeon: Elam Dutch, MD;  Location: [facility];  Service: Cardiovascular;  Laterality: N/A;   LEFT HEART CATH AND CORONARY ANGIOGRAPHY N/A 04/27/2019   Procedure: LEFT HEART CATH AND CORONARY ANGIOGRAPHY;  Surgeon: Troy Sine, MD;  Location: [facility];  Service: Cardiovascular;  Laterality: N/A;   LOWER EXTREMITY ANGIOGRAPHY Left 10/02/2020   Procedure: LOWER EXTREMITY ANGIOGRAPHY;  Surgeon: Algernon Huxley, MD;  Location: [facility];  Service: Cardiovascular;  Laterality: Left;   NECK SURGERY  1998   fusion   PERCUTANEOUS VENOUS THROMBECTOMY,LYSIS WITH INTRAVASCULAR ULTRASOUND (IVUS) Left 09/13/2017   Procedure: REMOVAL OF  LYSIS CATHETER AND INTRAVASCULAR ULTRASOUND (IVUS) LEFT ILIAC VEIN;  Surgeon: Elam Dutch, MD;  Location: [facility];  Service: Vascular;  Laterality: Left;   PERIPHERAL VASCULAR INTERVENTION  09/15/2017   Procedure: PERIPHERAL VASCULAR INTERVENTION;  Surgeon: Waynetta Sandy, MD;  Location: [facility];  Service: Cardiovascular;;  VEINOUS/STENT   PERIPHERAL VASCULAR  THROMBECTOMY Left 09/12/2017   Procedure: PERIPHERAL VASCULAR THROMBECTOMY;  Surgeon: Elam Dutch, MD;  Location: [facility];  Service: Cardiovascular;  Laterality: Left;   PERIPHERAL VASCULAR THROMBECTOMY Left 09/15/2017   Procedure: PERIPHERAL VASCULAR THROMBECTOMY;  Surgeon: Waynetta Sandy, MD;  Location: [facility];  Service: Cardiovascular;  Laterality: Left;  IVC TO LT FEM/POP VEIN   SHOULDER ARTHROSCOPY WITH SUBACROMIAL DECOMPRESSION Left 06/23/2013   Procedure: LEFT SHOULDER ARTHROSCOPY WITH DEBRIDEMENT ROTATOR CUFF AND LABRUM;  Surgeon: Lorn Junes, MD;  Location: [facility];  Service: Orthopedics;  Laterality: Left;   SHOULDER SURGERY  1999   left    Family History  Problem Relation Age of Onset   Diabetes Father    Hypertension Father    Parkinson's disease Father    Cancer Father        not sure what kind   Hypertension Mother    Heart disease Mother    Drug abuse Sister    Mesothelioma Brother    Cancer Daughter        non-hodgkins lmphoma   Hypertension Brother    Diabetes Brother    Colon cancer Neg Hx    Gastric cancer Neg Hx    Esophageal cancer Neg Hx    Social History:  reports that she has been smoking cigarettes. She started smoking about 51 years ago. She has a 43.50 pack-year smoking history. She has never used smokeless tobacco. She reports that she does not currently use alcohol. She reports that she does not use drugs.  Allergies:  Allergies  Allergen Reactions   Penicillins Hives and Rash    Has patient had a PCN reaction causing immediate rash, facial/tongue/throat swelling, SOB or lightheadedness with hypotension: Yes Has patient had a PCN reaction causing severe rash involving mucus membranes or skin necrosis: No Has patient had a PCN reaction that required hospitalization No Has patient had a PCN reaction occurring within the last 10 years: No If all of the above answers are "NO", then may proceed  with Cephalosporin use.    Doxycycline Itching    Medications Prior to Admission  Medication Sig Dispense Refill   atorvastatin (LIPITOR) 80 MG tablet Take 1 tablet (80 mg total) by mouth daily. 90 tablet 3   Cholecalciferol (VITAMIN D) 50 MCG (2000 UT) CAPS Take 2,000 Units by mouth daily.     clopidogrel (PLAVIX) 75 MG tablet Take 1 tablet (75 mg total) by mouth daily with breakfast. 90 tablet 3   cyanocobalamin (VITAMIN B12) 500 MCG tablet Take 500 mcg by mouth daily.     dofetilide (TIKOSYN) 250 MCG capsule Take 1 capsule (250 mcg total) by mouth 2 (two) times daily. 180 capsule 3   glipiZIDE (GLUCOTROL XL) 5 MG 24 hr tablet Take 5 mg by mouth every morning.     JARDIANCE 10 MG TABS tablet Take 10 mg by mouth daily.     losartan (COZAAR) 100 MG tablet Take 100 mg by mouth daily with breakfast.      nitroGLYCERIN (NITROSTAT) 0.4 MG SL tablet Place 1 tablet (0.4 mg total) under the tongue every 5 (  five) minutes as needed for chest pain. 30 tablet 0   potassium chloride SA (KLOR-CON) 20 MEQ tablet Take 1 tablet (20 mEq total) by mouth 2 (two) times daily. (Patient taking differently: Take 20 mEq by mouth in the morning, at noon, and at bedtime.)     rivaroxaban (XARELTO) 20 MG TABS tablet Take 1 tablet (20 mg total) by mouth daily with supper. (Patient taking differently: Take 20 mg by mouth at bedtime.) 90 tablet 1   Skin Protectants, Misc. (EUCERIN) cream Apply 1 Application topically daily.     spironolactone (ALDACTONE) 25 MG tablet Take 25 mg by mouth daily with breakfast.      torsemide (DEMADEX) 20 MG tablet Take 60 mg by mouth daily after breakfast.      VICTOZA 18 MG/3ML SOPN Inject 1.8 mg into the skin at bedtime.      albuterol (VENTOLIN HFA) 108 (90 Base) MCG/ACT inhaler Inhale 2 puffs into the lungs every 6 (six) hours as needed for wheezing or shortness of breath.     alendronate (FOSAMAX) 70 MG tablet Take 70 mg by mouth every Sunday.     BD PEN NEEDLE NANO U/F 32G X 4 MM MISC  USE FOR VICTOZA INJECTIONS     fluticasone (FLONASE) 50 MCG/ACT nasal spray Place 2 sprays into both nostrils daily as needed for rhinitis (congestion/ dryness).     HYDROcodone-acetaminophen (NORCO/VICODIN) 5-325 MG tablet Take 1 tablet by mouth every 6 (six) hours as needed. 10 tablet 0   Lancets (ONETOUCH DELICA PLUS [id number]) MISC 1 each daily.      Results for orders placed or performed during the hospital encounter of 03/15/22 (from the past 48 hour(s))  Glucose, capillary     Status: Abnormal   Collection Time: 03/15/22  6:20 AM  Result Value Ref Range   Glucose-Capillary 195 (H) 70 - 99 mg/dL    Comment: Glucose reference range applies only to samples taken after fasting for at least 8 hours.   No results found.  Pertinent items noted in HPI and remainder of comprehensive ROS otherwise negative.  Blood pressure (!) 159/60, pulse (!) 47, temperature 97.9 F (36.6 C), temperature source Oral, resp. rate 18, height '5\' 4"'$  (1.626 m), weight 124 kg, SpO2 94 %.  Patient is awake and alert.  She is oriented and appropriate.  Speech is fluent.  Judgment insight are intact.  Cranial nerve function normal bilaterally.  Motor examination with some decreased right quadricep strength otherwise motor strength intact.  Sensory examination with decrease sensation pinprick light touch in her right L4 dermatome.  Gait antalgic.  Posture mildly flexed.  Social head ears eyes nose and throat is unremarked.  Chest and abdomen are benign.  Extremities are free of major deformity. Assessment/Plan Right L4-5 foraminal/extraforaminal disc protrusion with radiculopathy.  Plan right L4-5 foraminal microdiscectomy.  Risks and benefits been explained.  Patient wishes to proceed.  Cooper Render Abra Lingenfelter 03/15/2022, 8:09 AM

## 2022-03-15 NOTE — Anesthesia Procedure Notes (Signed)
Arterial Line Insertion Start/End2/03/2022 7:35 AM, 03/15/2022 7:40 AM Performed by: Betha Loa, CRNA, CRNA  Patient location: Pre-op. Preanesthetic checklist: patient identified, IV checked, site marked, risks and benefits discussed, surgical consent, monitors and equipment checked, pre-op evaluation, timeout performed and anesthesia consent Lidocaine 1% used for infiltration Left, radial was placed Catheter size: 20 G Hand hygiene performed , maximum sterile barriers used  and Seldinger technique used Allen's test indicative of satisfactory collateral circulation Procedure performed without using ultrasound guided technique. Following insertion, dressing applied and Biopatch. Post procedure assessment: normal  Patient tolerated the procedure well with no immediate complications.

## 2022-03-15 NOTE — Discharge Instructions (Signed)
Wound Care Keep incision covered and dry for three days.  Do not put any creams, lotions, or ointments on incision. Leave steri-strips on back.  They will fall off by themselves. Activity Walk each and every day, increasing distance each day. No lifting greater than 5 lbs.  Avoid excessive neck motion. No driving for 2 weeks; may ride as a passenger locally.  Diet Resume your normal diet.  Return to Work Will be discussed at you follow up appointment. Call Your Doctor If Any of These Occur Redness, drainage, or swelling at the wound.  Temperature greater than 101 degrees. Severe pain not relieved by pain medication. Incision starts to come apart. Follow Up Appt Call  [phone]) or for problems.

## 2022-03-15 NOTE — Brief Op Note (Signed)
03/15/2022  10:01 AM  PATIENT:  Amy Tran  67 y.o. female  PRE-OPERATIVE DIAGNOSIS:  Radiculopathy  POST-OPERATIVE DIAGNOSIS:  Radiculopathy  PROCEDURE:  Procedure(s): Microdiscectomy - right - Lumbar Four-Lumbar Five extraforaminal (Right)  SURGEON:  Surgeon(s) and Role:    Earnie Larsson, MD - Primary  PHYSICIAN ASSISTANT:   ASSISTANTS: Elsner,Bergman,NP   ANESTHESIA:   general  EBL:  100 mL   BLOOD ADMINISTERED:none  DRAINS: none   LOCAL MEDICATIONS USED:  MARCAINE     SPECIMEN:  No Specimen  DISPOSITION OF SPECIMEN:  N/A  COUNTS:  YES  TOURNIQUET:  * No tourniquets in log *  DICTATION: .Dragon Dictation  PLAN OF CARE: Admit for overnight observation  PATIENT DISPOSITION:  PACU - hemodynamically stable.   Delay start of Pharmacological VTE agent (>24hrs) due to surgical blood loss or risk of bleeding: yes

## 2022-03-15 NOTE — Transfer of Care (Signed)
Immediate Anesthesia Transfer of Care Note  Patient: Amy Tran  Procedure(s) Performed: Microdiscectomy - right - Lumbar Four-Lumbar Five extraforaminal (Right: Back)  Patient Location: PACU  Anesthesia Type:General  Level of Consciousness: patient cooperative and responds to stimulation  Airway & Oxygen Therapy: Patient Spontanous Breathing and Patient connected to nasal cannula oxygen  Post-op Assessment: Report given to RN and Post -op Vital signs reviewed and stable  Post vital signs: Reviewed and stable  Last Vitals:  Vitals Value Taken Time  BP 154/58 03/15/22 1015  Temp 36.2 C 03/15/22 1015  Pulse 48 03/15/22 1018  Resp 12 03/15/22 1018  SpO2 95 % 03/15/22 1018  Vitals shown include unvalidated device data.  Last Pain:  Vitals:   03/15/22 0650  TempSrc:   PainSc: 7       Patients Stated Pain Goal: 3 (31/59/45 8592)  Complications: No notable events documented.

## 2022-03-15 NOTE — Anesthesia Postprocedure Evaluation (Signed)
Anesthesia Post Note  Patient: Amy Tran  Procedure(s) Performed: Microdiscectomy - right - Lumbar Four-Lumbar Five extraforaminal (Right: Back)     Patient location during evaluation: PACU Anesthesia Type: General Level of consciousness: awake and alert Pain management: pain level controlled Vital Signs Assessment: post-procedure vital signs reviewed and stable Respiratory status: spontaneous breathing, nonlabored ventilation and respiratory function stable Cardiovascular status: blood pressure returned to baseline and stable Postop Assessment: no apparent nausea or vomiting Anesthetic complications: no  No notable events documented.  Last Vitals:  Vitals:   03/15/22 1115 03/15/22 1152  BP: (!) 151/81 (!) 155/57  Pulse: (!) 46 (!) 48  Resp: (!) 22 16  Temp:  (!) 36.4 C  SpO2: 96% 100%    Last Pain:  Vitals:   03/15/22 1152  TempSrc: Oral  PainSc:                  Faizon Capozzi,W. EDMOND

## 2022-03-16 ENCOUNTER — Encounter (HOSPITAL_COMMUNITY): Payer: Self-pay | Admitting: Neurosurgery

## 2022-03-16 DIAGNOSIS — I10 Essential (primary) hypertension: Secondary | ICD-10-CM | POA: Diagnosis not present

## 2022-03-16 DIAGNOSIS — M5126 Other intervertebral disc displacement, lumbar region: Secondary | ICD-10-CM | POA: Diagnosis not present

## 2022-03-16 DIAGNOSIS — J45909 Unspecified asthma, uncomplicated: Secondary | ICD-10-CM | POA: Diagnosis not present

## 2022-03-16 DIAGNOSIS — Z85828 Personal history of other malignant neoplasm of skin: Secondary | ICD-10-CM | POA: Diagnosis not present

## 2022-03-16 DIAGNOSIS — I2510 Atherosclerotic heart disease of native coronary artery without angina pectoris: Secondary | ICD-10-CM | POA: Diagnosis not present

## 2022-03-16 DIAGNOSIS — E119 Type 2 diabetes mellitus without complications: Secondary | ICD-10-CM | POA: Diagnosis not present

## 2022-03-16 DIAGNOSIS — M5416 Radiculopathy, lumbar region: Secondary | ICD-10-CM | POA: Diagnosis not present

## 2022-03-16 DIAGNOSIS — Z955 Presence of coronary angioplasty implant and graft: Secondary | ICD-10-CM | POA: Diagnosis not present

## 2022-03-16 DIAGNOSIS — F17210 Nicotine dependence, cigarettes, uncomplicated: Secondary | ICD-10-CM | POA: Diagnosis not present

## 2022-03-16 LAB — GLUCOSE, CAPILLARY: Glucose-Capillary: 132 mg/dL — ABNORMAL HIGH (ref 70–99)

## 2022-03-16 MED ORDER — CYCLOBENZAPRINE HCL 10 MG PO TABS: 10.0000 mg | ORAL_TABLET | Freq: Three times a day (TID) | ORAL | 3 refills | Status: DC | PRN

## 2022-03-16 MED ORDER — HYDROCODONE-ACETAMINOPHEN 10-325 MG PO TABS: 1.0000 | ORAL_TABLET | Freq: Four times a day (QID) | ORAL | 0 refills | Status: DC | PRN

## 2022-03-16 NOTE — Progress Notes (Signed)
Patient alert and oriented, mae's well, voiding adequate amount of urine, swallowing without difficulty, no c/o pain at time of discharge. Patient discharged home with family. Script and discharged instructions given to patient. Patient and family stated understanding of instructions given. Patient has an appointment with Dr. Pool  

## 2022-03-16 NOTE — Discharge Summary (Signed)
Physician Discharge Summary  Patient ID: Amy Tran MRN: [id number] DOB/AGE: Nov 16, 1955 67 y.o.  Admit date: 03/15/2022 Discharge date: 03/16/2022  Admission Diagnoses: Needed nucleus pulposus L4-L5 left, extraforaminal.  Lumbar radiculopathy.  Discharge Diagnoses: Lumbar radiculopathy L4 left.  Extraforaminal herniated nucleus pulposus L4-L5 left. Principal Problem:   Lumbar radiculopathy   Discharged Condition: good  Hospital Course: Patient was admitted to undergo surgical decompression arthrodesis at L4-L5.  He tolerated surgery well.  Consults: None  Significant Diagnostic Studies: None  Treatments: surgery: See op note  Discharge Exam: Blood pressure (!) 127/53, pulse (!) 51, temperature 99.3 F (37.4 C), temperature source Oral, resp. rate 20, height '5\' 4"'$  (1.626 m), weight 124 kg, SpO2 97 %. Incision is clean and dry motor function is intact.  Disposition: Discharge disposition: 01-Home or Self Care       Discharge Instructions     Call MD for:  redness, tenderness, or signs of infection (pain, swelling, redness, odor or green/yellow discharge around incision site)   Complete by: As directed    Call MD for:  severe uncontrolled pain   Complete by: As directed    Call MD for:  temperature >100.4   Complete by: As directed    Diet - low sodium heart healthy   Complete by: As directed    Increase activity slowly   Complete by: As directed       Allergies as of 03/16/2022       Reactions   Penicillins Hives, Rash   Has patient had a PCN reaction causing immediate rash, facial/tongue/throat swelling, SOB or lightheadedness with hypotension: Yes Has patient had a PCN reaction causing severe rash involving mucus membranes or skin necrosis: No Has patient had a PCN reaction that required hospitalization No Has patient had a PCN reaction occurring within the last 10 years: No If all of the above answers are "NO", then may proceed with Cephalosporin use.    Doxycycline Itching        Medication List     STOP taking these medications    HYDROcodone-acetaminophen 5-325 MG tablet Commonly known as: NORCO/VICODIN Replaced by: HYDROcodone-acetaminophen 10-325 MG tablet       TAKE these medications    albuterol 108 (90 Base) MCG/ACT inhaler Commonly known as: VENTOLIN HFA Inhale 2 puffs into the lungs every 6 (six) hours as needed for wheezing or shortness of breath.   alendronate 70 MG tablet Commonly known as: FOSAMAX Take 70 mg by mouth every Sunday.   atorvastatin 80 MG tablet Commonly known as: LIPITOR Take 1 tablet (80 mg total) by mouth daily.   BD Pen Needle Nano U/F 32G X 4 MM Misc Generic drug: Insulin Pen Needle USE FOR VICTOZA INJECTIONS   clopidogrel 75 MG tablet Commonly known as: PLAVIX Take 1 tablet (75 mg total) by mouth daily with breakfast. Notes to patient: 03/16/2022   cyanocobalamin 500 MCG tablet Commonly known as: VITAMIN B12 Take 500 mcg by mouth daily.   cyclobenzaprine 10 MG tablet Commonly known as: FLEXERIL Take 1 tablet (10 mg total) by mouth 3 (three) times daily as needed for muscle spasms.   dofetilide 250 MCG capsule Commonly known as: TIKOSYN Take 1 capsule (250 mcg total) by mouth 2 (two) times daily.   eucerin cream Apply 1 Application topically daily.   fluticasone 50 MCG/ACT nasal spray Commonly known as: FLONASE Place 2 sprays into both nostrils daily as needed for rhinitis (congestion/ dryness).   glipiZIDE 5 MG 24 hr tablet Commonly  known as: GLUCOTROL XL Take 5 mg by mouth every morning.   HYDROcodone-acetaminophen 10-325 MG tablet Commonly known as: NORCO Take 1-2 tablets by mouth every 6 (six) hours as needed for severe pain ((score 7 to 10)). Replaces: HYDROcodone-acetaminophen 5-325 MG tablet   Jardiance 10 MG Tabs tablet Generic drug: empagliflozin Take 10 mg by mouth daily.   losartan 100 MG tablet Commonly known as: COZAAR Take 100 mg by mouth daily with  breakfast.   nitroGLYCERIN 0.4 MG SL tablet Commonly known as: NITROSTAT Place 1 tablet (0.4 mg total) under the tongue every 5 (five) minutes as needed for chest pain.   OneTouch Delica Plus [id number] Misc 1 each daily.   potassium chloride SA 20 MEQ tablet Commonly known as: KLOR-CON M Take 1 tablet (20 mEq total) by mouth 2 (two) times daily. What changed: when to take this   rivaroxaban 20 MG Tabs tablet Commonly known as: Xarelto Take 1 tablet (20 mg total) by mouth daily with supper. What changed: when to take this   spironolactone 25 MG tablet Commonly known as: ALDACTONE Take 25 mg by mouth daily with breakfast.   torsemide 20 MG tablet Commonly known as: DEMADEX Take 60 mg by mouth daily after breakfast.   Victoza 18 MG/3ML Sopn Generic drug: liraglutide Inject 1.8 mg into the skin at bedtime.   Vitamin D 50 MCG (2000 UT) Caps Take 2,000 Units by mouth daily.        Follow-up Information     Earnie Larsson, MD Follow up.   Specialty: Neurosurgery Why: As needed, If symptoms worsen Contact information: [address] [place] [address] [place] [address] [phone]                 Signed: Earleen Newport 03/16/2022, 9:25 AM

## 2022-03-16 NOTE — Plan of Care (Signed)

## 2022-03-16 NOTE — Care Management (Signed)
Patient with order to DC to home today. Unit staff to provide DME needed for home.   No HH needs identified Patient will have family/ friends provide transportation home. No other TOC needs identified for DC 

## 2022-03-16 NOTE — Evaluation (Signed)
Occupational Therapy Evaluation and Discharge Patient Details Name: Amy Tran MRN: [id number] DOB: 1955/07/27 Today's Date: 03/16/2022   History of Present Illness Pt is a 81 female s/p right L4-5 extraforaminal microdiscectomy due to foraminal stenosis and extraforaminal disc protrusion on the right at L4-5 with L4 nerve root compression.   Clinical Impression   This 67 yo female admitted with above presents to acute OT with PLOF of Mod I to Independent with basic ADLs and IADLs with some A from dtr at times for IADLs. All education completed with pt and handout provided, we will D/C from acute OT.      Recommendations for follow up therapy are one component of a multi-disciplinary discharge planning process, led by the attending physician.  Recommendations may be updated based on patient status, additional functional criteria and insurance authorization.   Follow Up Recommendations  No OT follow up     Assistance Recommended at Discharge PRN  Patient can return home with the following Assistance with cooking/housework;Assist for transportation    Functional Status Assessment  Patient has had a recent decline in their functional status and demonstrates the ability to make significant improvements in function in a reasonable and predictable amount of time. (without further need of skilled OT services)  Equipment Recommendations  None recommended by OT       Precautions / Restrictions Precautions Precautions: Back Precaution Booklet Issued: Yes (comment) Precaution Comments: no brace required Restrictions Weight Bearing Restrictions: No      Mobility Bed Mobility Overal bed mobility: Modified Independent             General bed mobility comments: Instructed pt on in and OOB and pt able to verbalize technique    Transfers Overall transfer level: Modified independent Equipment used: Rolling walker (2 wheels)                      Balance Overall  balance assessment: Mild deficits observed, not formally tested                                         ADL either performed or assessed with clinical judgement   ADL Overall ADL's : Modified independent                                       General ADL Comments: Educated on use of two cups for brushing teeth/mouth care so as to not bend over the sink, use of wet wipes for back peri care to not twist as much, not sitting more than 20-30 minutes building up to an hour--then need to get up and walk around, use of reacher for LBD and long handled sponge for bathing (issued these to patient), use of pillow placement when in bed, stair training. Pt returned demonstrated dressing, toileting, and stair training.     Vision Baseline Vision/History: 1 Wears glasses Ability to See in Adequate Light: 0 Adequate Patient Visual Report: No change from baseline              Pertinent Vitals/Pain Pain Assessment Pain Assessment: 0-10 Pain Score: 10-Worst pain ever Pain Location: head (lateral temporals) and lower back at both sides of bottom of bandage Pain Descriptors / Indicators: Aching, Sore Pain Intervention(s): Limited activity within patient's tolerance, Monitored during session, Repositioned,  Patient requesting pain meds-RN notified, RN gave pain meds during session     Hand Dominance Right   Extremity/Trunk Assessment Upper Extremity Assessment Upper Extremity Assessment: Overall WFL for tasks assessed           Communication Communication Communication: No difficulties   Cognition Arousal/Alertness: Awake/alert Behavior During Therapy: WFL for tasks assessed/performed Overall Cognitive Status: Within Functional Limits for tasks assessed                                       General Comments  Reports she holds onto furniture/walls at home            Adel expects to be discharged to:: Private  residence Living Arrangements: Children Available Help at Discharge: Family;Available PRN/intermittently Type of Home: Mobile home Home Access: Stairs to enter Entrance Stairs-Number of Steps: 4 Entrance Stairs-Rails: Right;Left;Can reach both Home Layout: One level     Bathroom Shower/Tub:  (sponge bathes due to shower not working)   Biochemist, clinical: Handicapped height     Home Equipment: Conservation officer, nature (2 wheels);BSC/3in1          Prior Functioning/Environment Prior Level of Function : Independent/Modified Independent;Driving                        OT Problem List: Decreased range of motion;Impaired balance (sitting and/or standing);Obesity;Pain         OT Goals(Current goals can be found in the care plan section) Acute Rehab OT Goals Patient Stated Goal: to go home today         AM-PAC OT "6 Clicks" Daily Activity     Outcome Measure Help from another person eating meals?: None Help from another person taking care of personal grooming?: None Help from another person toileting, which includes using toliet, bedpan, or urinal?: None Help from another person bathing (including washing, rinsing, drying)?: None Help from another person to put on and taking off regular upper body clothing?: None Help from another person to put on and taking off regular lower body clothing?: None 6 Click Score: 24   End of Session Equipment Utilized During Treatment: Gait belt;Rolling walker (2 wheels) Nurse Communication:  (no further OT needs, no PT needs)  Activity Tolerance: Patient tolerated treatment well Patient left:  (sitting EOB)  OT Visit Diagnosis: Other abnormalities of gait and mobility (R26.89);Pain Pain - part of body:  (head and lower part of back)                Time: 1937-9024 OT Time Calculation (min): 40 min Charges:  OT General Charges $OT Visit: 1 Visit OT Evaluation $OT Eval Moderate Complexity: 1 Mod OT Treatments $Self Care/Home Management :  23-37 mins [place] Office [phone]    Almon Register 03/16/2022, 8:55 AM

## 2022-03-18 MED FILL — Thrombin For Soln 5000 Unit: CUTANEOUS | Qty: 2 | Status: AC

## 2022-03-21 ENCOUNTER — Encounter (HOSPITAL_COMMUNITY): Payer: Self-pay | Admitting: *Deleted

## 2022-03-29 ENCOUNTER — Telehealth: Payer: Self-pay | Admitting: Cardiology

## 2022-03-29 NOTE — Telephone Encounter (Signed)
Pt c/o medication issue:  1. Name of Medication:    clopidogrel (PLAVIX) 75 MG tablet  rivaroxaban (XARELTO) 20 MG TABS tablet  2. How are you currently taking this medication (dosage and times per day)?   3. Are you having a reaction (difficulty breathing--STAT)?   4. What is your medication issue?  Pharmacy is calling to get clarification and to have this verbally approved to take together. Requesting to speak to someone asap as to not delay the medication.

## 2022-03-29 NOTE — Telephone Encounter (Signed)
Informed pharm per Dr. Harl Bowie last [place] on 03/13/22 -   Assessment and Plan  1.CAD - on xarelto for afib and plavix for recent lower extremity intervention, not for cardiac indication

## 2022-04-11 ENCOUNTER — Other Ambulatory Visit: Payer: Self-pay | Admitting: *Deleted

## 2022-04-11 ENCOUNTER — Inpatient Hospital Stay: Payer: Medicare HMO | Attending: Hematology

## 2022-04-11 DIAGNOSIS — D509 Iron deficiency anemia, unspecified: Secondary | ICD-10-CM | POA: Insufficient documentation

## 2022-04-11 DIAGNOSIS — R718 Other abnormality of red blood cells: Secondary | ICD-10-CM | POA: Diagnosis not present

## 2022-04-11 DIAGNOSIS — R5383 Other fatigue: Secondary | ICD-10-CM | POA: Insufficient documentation

## 2022-04-11 DIAGNOSIS — Z79899 Other long term (current) drug therapy: Secondary | ICD-10-CM | POA: Diagnosis not present

## 2022-04-11 LAB — CBC WITH DIFFERENTIAL/PLATELET
Abs Immature Granulocytes: 0.02 10*3/uL (ref 0.00–0.07)
Basophils Absolute: 0.1 10*3/uL (ref 0.0–0.1)
Basophils Relative: 1 %
Eosinophils Absolute: 0.2 10*3/uL (ref 0.0–0.5)
Eosinophils Relative: 3 %
HCT: 44.6 % (ref 36.0–46.0)
Hemoglobin: 14.5 g/dL (ref 12.0–15.0)
Immature Granulocytes: 0 %
Lymphocytes Relative: 23 %
Lymphs Abs: 1.8 10*3/uL (ref 0.7–4.0)
MCH: 29.8 pg (ref 26.0–34.0)
MCHC: 32.5 g/dL (ref 30.0–36.0)
MCV: 91.8 fL (ref 80.0–100.0)
Monocytes Absolute: 0.8 10*3/uL (ref 0.1–1.0)
Monocytes Relative: 10 %
Neutro Abs: 5.2 10*3/uL (ref 1.7–7.7)
Neutrophils Relative %: 63 %
Platelets: 292 10*3/uL (ref 150–400)
RBC: 4.86 MIL/uL (ref 3.87–5.11)
RDW: 14.7 % (ref 11.5–15.5)
WBC: 8.1 10*3/uL (ref 4.0–10.5)
nRBC: 0 % (ref 0.0–0.2)

## 2022-04-11 LAB — IRON AND TIBC
Iron: 52 ug/dL (ref 28–170)
Saturation Ratios: 13 % (ref 10.4–31.8)
TIBC: 388 ug/dL (ref 250–450)
UIBC: 336 ug/dL

## 2022-04-11 LAB — FERRITIN: Ferritin: 74 ng/mL (ref 11–307)

## 2022-04-16 ENCOUNTER — Encounter (HOSPITAL_COMMUNITY): Payer: Self-pay | Admitting: Hematology

## 2022-04-18 ENCOUNTER — Telehealth: Payer: Medicare Other | Admitting: Physician Assistant

## 2022-04-18 NOTE — Progress Notes (Signed)
VIRTUAL VISIT via [place]   I connected with Amy Tran  on 04/19/22 at 3:25 PM by telephone and verified that I am speaking with the correct person using two identifiers.  Location: Patient: Home Provider: [facility]   I discussed the limitations, risks, security and privacy concerns of performing an evaluation and management service by telephone and the availability of in person appointments. I also discussed with the patient that there may be a patient responsible charge related to this service. The patient expressed understanding and agreed to proceed.  REASON FOR VISIT:  Follow-up for iron deficiency anemia   PRIOR THERAPY: Oral iron tablets   CURRENT THERAPY: Intermittent IV iron infusions  INTERVAL HISTORY:  Amy Tran is contacted today for follow-up of iron deficiency anemia.  She was last seen by Tarri Abernethy PA-C on 01/10/2022.  At today's visit, she reports feeling fair.   In the interim since her last visit, she had lumbar disc surgery on 03/15/2022.  No other surgeries, hospitalizations, or major changes in baseline health status.  She continues to follow closely with vascular specialist due to extensive peripheral arterial and peripheral venous disease.   She has chronic fatigue, with energy somewhat lower than usual following her back surgery.  She "feels tired all the time" and "sluggish."  She reports ice cravings.  She continues to take Xarelto for her history of DVT and her A-fib.  She has not noticed any signs of blood loss such as epistaxis, bright red blood per rectum, or melena.  She has occasional headaches. She denies any chest pain, dyspnea on exertion, lightheadedness, or syncope.  She has little to no energy and 80% appetite. She endorses that she is maintaining a stable weight.  REVIEW OF SYSTEMS:   Review of Systems  Constitutional:  Positive for malaise/fatigue. Negative for chills,  diaphoresis, fever and weight loss.  Respiratory:  Negative for cough and shortness of breath.   Cardiovascular:  Negative for chest pain and palpitations.  Gastrointestinal:  Negative for abdominal pain, blood in stool, melena, nausea and vomiting.  Neurological:  Negative for dizziness and headaches.     PHYSICAL EXAM: (per limitations of virtual telephone visit)  The patient is alert and oriented x 3, exhibiting adequate mentation, good mood, and ability to speak in full sentences and execute sound judgement.  ASSESSMENT & PLAN:  1.  Iron deficiency state: - Patient seen at the request of Dr. Willey Blade for further management of iron deficiency state. - Labs from 12/11/2020 showed hemoglobin 12.4, MCV 78, low MCH and MCHC and high RDW consistent with iron deficiency state.  Ferritin was 22. - She could not tolerate iron tablets in the past which caused diarrhea.  Denies any prior history of transfusion. - Last colonoscopy on 12/28/2013: Colonic diverticulosis. - She is on Xarelto and Plavix for A. fib and peripheral vascular disease. - Additional work-up revealed normal vitamin B12, folate, LDH, SPEP, and reticulocytes  -- Unable to tolerate oral iron due to diarrhea - IV iron most recently given on 03/30/2021 and 04/09/2021 - She continues to have fatigue and ice cravings. - No bright red blood per rectum or melena - Labs (04/11/2022): Normal CBC with Hgb 14.5.  Ferritin 74.  Iron saturation 13%. - PLAN: Discussed with patient that she would qualify for IV iron due to fatigue in the setting of ferritin <100 and iron saturation <20.  She would like to hold off on  IV iron for now, since it previously did little to improve her energy levels. -- We will recheck CBC and iron panel with phone visit in 4 months.   2.  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 - She has had leg claudication symptoms related to atherosclerosis of the native artery of the left leg with rest pain. - On 10/02/2020 she had left leg balloon angioplasty of left posterior tibial artery, mechanical thrombectomy of the proximal left popliteal artery, and balloon angioplasty of proximal left popliteal artery, and stent placement to proximal left popliteal artery. - She follows with [facility] for her extensive peripheral vascular disease  - She is taking Xarelto and Plavix    - Exam shows chronic lymphedema of left leg with left calf circumference markedly larger than right leg.  Patient states that this is chronic and unchanged.   -- She continues to follow closely with vascular surgery.  Most recent venous duplex on 06/20/2021 of right leg was negative for DVT. - PLAN: Patient instructed to continue Xarelto and to continue follow-up with her established vein and vascular specialists.   3. Social/family history: - Lives at home with her daughter.  Independent of ADLs and IADLs.  She works as a Environmental education officer at [employer].  She started smoking at age 35, 2 to 3 packs/day and quit for 16 years.  Restarted back again 5 years back when her husband died.  She is currently smoking 10 cigarettes/day now. - Father, maternal grandfather and maternal grandmother had cancers.  Type unknown to the patient.  PLAN SUMMARY: >> Labs in 4 months = CBC/D, CMP, ferritin, iron/TIBC, B12, MMA, folate >> PHONE visit 1 week after labs     I discussed the assessment and treatment plan with the patient. The patient was provided an opportunity to ask questions and all were answered. The patient agreed with the plan and demonstrated an understanding of the instructions.   The  patient was advised to call back or seek an in-person evaluation if the symptoms worsen or if the condition fails to improve as anticipated.  I provided 22 minutes of non-face-to-face time during this encounter.  Harriett Rush, PA-C 04/19/22 4:26 PM

## 2022-04-19 ENCOUNTER — Inpatient Hospital Stay: Payer: Medicare HMO | Attending: Hematology | Admitting: Physician Assistant

## 2022-04-19 DIAGNOSIS — D509 Iron deficiency anemia, unspecified: Secondary | ICD-10-CM | POA: Diagnosis not present

## 2022-04-19 DIAGNOSIS — R5383 Other fatigue: Secondary | ICD-10-CM

## 2022-04-22 ENCOUNTER — Other Ambulatory Visit: Payer: Self-pay

## 2022-04-22 DIAGNOSIS — R718 Other abnormality of red blood cells: Secondary | ICD-10-CM

## 2022-04-22 DIAGNOSIS — R5383 Other fatigue: Secondary | ICD-10-CM

## 2022-04-22 DIAGNOSIS — D509 Iron deficiency anemia, unspecified: Secondary | ICD-10-CM

## 2022-05-22 ENCOUNTER — Other Ambulatory Visit: Payer: Self-pay | Admitting: Cardiology

## 2022-05-30 ENCOUNTER — Other Ambulatory Visit: Payer: Self-pay | Admitting: *Deleted

## 2022-05-30 ENCOUNTER — Other Ambulatory Visit: Payer: Self-pay

## 2022-05-30 ENCOUNTER — Telehealth: Payer: Self-pay | Admitting: *Deleted

## 2022-05-30 DIAGNOSIS — D509 Iron deficiency anemia, unspecified: Secondary | ICD-10-CM

## 2022-05-30 NOTE — Telephone Encounter (Signed)
Received office notes from Dr. Ouida Sills with request for iron infusions, however office had no recent iron panel or cbc on her to send to support treatment.  Spoke with patient, who expressed sever fatigue and insomnia.  Scheduled for labs tomorrow and will follow up.  Rojelio Brenner, PAC made aware.

## 2022-05-31 ENCOUNTER — Other Ambulatory Visit: Payer: Self-pay | Admitting: Physician Assistant

## 2022-05-31 ENCOUNTER — Encounter (HOSPITAL_COMMUNITY): Payer: Self-pay | Admitting: Hematology

## 2022-05-31 ENCOUNTER — Inpatient Hospital Stay: Payer: 59 | Attending: Hematology

## 2022-05-31 DIAGNOSIS — R5383 Other fatigue: Secondary | ICD-10-CM | POA: Insufficient documentation

## 2022-05-31 DIAGNOSIS — D509 Iron deficiency anemia, unspecified: Secondary | ICD-10-CM

## 2022-05-31 DIAGNOSIS — Z79899 Other long term (current) drug therapy: Secondary | ICD-10-CM | POA: Insufficient documentation

## 2022-05-31 DIAGNOSIS — Z86718 Personal history of other venous thrombosis and embolism: Secondary | ICD-10-CM | POA: Insufficient documentation

## 2022-05-31 LAB — CBC
HCT: 42.7 % (ref 36.0–46.0)
Hemoglobin: 14 g/dL (ref 12.0–15.0)
MCH: 29.9 pg (ref 26.0–34.0)
MCHC: 32.8 g/dL (ref 30.0–36.0)
MCV: 91.2 fL (ref 80.0–100.0)
Platelets: 272 10*3/uL (ref 150–400)
RBC: 4.68 MIL/uL (ref 3.87–5.11)
RDW: 13.7 % (ref 11.5–15.5)
WBC: 10.4 10*3/uL (ref 4.0–10.5)
nRBC: 0 % (ref 0.0–0.2)

## 2022-05-31 LAB — IRON AND TIBC
Iron: 50 ug/dL (ref 28–170)
Saturation Ratios: 12 % (ref 10.4–31.8)
TIBC: 405 ug/dL (ref 250–450)
UIBC: 355 ug/dL

## 2022-05-31 LAB — FERRITIN: Ferritin: 76 ng/mL (ref 11–307)

## 2022-06-03 ENCOUNTER — Telehealth: Payer: Self-pay

## 2022-06-03 NOTE — Telephone Encounter (Signed)
Message left asking the patient to call the CC for Feraheme infusion dates and times.

## 2022-06-03 NOTE — Telephone Encounter (Signed)
-----   Message from Carnella [place], [place] sent at 05/31/2022  3:38 PM EDT ----- Kendal Hymen:  Please call patient to let her know that... >> Iron levels are slightly low, which could be causing some of her symptoms.   >> She needs IV Feraheme x 2 doses, if you can coordinate that with schedulers.   >> Otherwise, she can follow-up with me as scheduled in July 2024.  ##FYI: Also including Tomi since she initially spoke with patient.

## 2022-06-07 ENCOUNTER — Inpatient Hospital Stay: Payer: 59

## 2022-06-07 VITALS — BP 114/52 | HR 58 | Temp 97.7°F | Resp 18

## 2022-06-07 DIAGNOSIS — R718 Other abnormality of red blood cells: Secondary | ICD-10-CM

## 2022-06-07 DIAGNOSIS — D509 Iron deficiency anemia, unspecified: Secondary | ICD-10-CM | POA: Diagnosis not present

## 2022-06-07 MED ORDER — ACETAMINOPHEN 325 MG PO TABS
650.0000 mg | ORAL_TABLET | Freq: Once | ORAL | Status: AC
  Administered 2022-06-07: 650 mg via ORAL
  Filled 2022-06-07: qty 2

## 2022-06-07 MED ORDER — SODIUM CHLORIDE 0.9 % IV SOLN
510.0000 mg | Freq: Once | INTRAVENOUS | Status: AC
  Administered 2022-06-07: 510 mg via INTRAVENOUS
  Filled 2022-06-07: qty 510

## 2022-06-07 MED ORDER — SODIUM CHLORIDE 0.9 % IV SOLN: Freq: Once | INTRAVENOUS | Status: AC

## 2022-06-07 MED ORDER — CETIRIZINE HCL 10 MG PO TABS
10.0000 mg | ORAL_TABLET | Freq: Once | ORAL | Status: AC
  Administered 2022-06-07: 10 mg via ORAL
  Filled 2022-06-07: qty 1

## 2022-06-07 NOTE — Progress Notes (Signed)
Patient presents today for iron infusion. Patient is in satisfactory condition with no new complaints voiced.  Vital signs are stable.  We will proceed with infusion per provider orders.    Peripheral IV started with good blood return pre and post infusion.  Feraheme given today per MD orders. Tolerated infusion without adverse affects. Vital signs stable. No complaints at this time. Discharged from clinic ambulatory in stable condition. Alert and oriented x 3. F/U with [facility] as scheduled.   

## 2022-06-07 NOTE — Patient Instructions (Signed)
[facility]  Discharge Instructions: Thank you for choosing [facility] to provide your oncology and hematology care.  If you have a lab appointment with the Cancer Center - please note that after April 8th, 2024, all labs will be drawn in the cancer center.  You do not have to check in or register with the main entrance as you have in the past but will complete your check-in in the cancer center.  Wear comfortable clothing and clothing appropriate for easy access to any Portacath or PICC line.   We strive to give you quality time with your provider. You may need to reschedule your appointment if you arrive late (15 or more minutes).  Arriving late affects you and other patients whose appointments are after yours.  Also, if you miss three or more appointments without notifying the office, you may be dismissed from the clinic at the provider's discretion.      For prescription refill requests, have your pharmacy contact our office and allow 72 hours for refills to be completed.    Today you received Feraheme IV iron infusion.   .  BELOW ARE SYMPTOMS THAT SHOULD BE REPORTED IMMEDIATELY: *FEVER GREATER THAN 100.4 F (38 C) OR HIGHER *CHILLS OR SWEATING *NAUSEA AND VOMITING THAT IS NOT CONTROLLED WITH YOUR NAUSEA MEDICATION *UNUSUAL SHORTNESS OF BREATH *UNUSUAL BRUISING OR BLEEDING *URINARY PROBLEMS (pain or burning when urinating, or frequent urination) *BOWEL PROBLEMS (unusual diarrhea, constipation, pain near the anus) TENDERNESS IN MOUTH AND THROAT WITH OR WITHOUT PRESENCE OF ULCERS (sore throat, sores in mouth, or a toothache) UNUSUAL RASH, SWELLING OR PAIN  UNUSUAL VAGINAL DISCHARGE OR ITCHING   Items with * indicate a potential emergency and should be followed up as soon as possible or go to the Emergency Department if any problems should occur.  Please show the CHEMOTHERAPY ALERT CARD or IMMUNOTHERAPY ALERT CARD at check-in to the Emergency  Department and triage nurse.  Should you have questions after your visit or need to cancel or reschedule your appointment, please contact [facility] [phone]  and follow the prompts.  Office hours are 8:00 a.m. to 4:30 p.m. Monday - Friday. Please note that voicemails left after 4:00 p.m. may not be returned until the following business day.  We are closed weekends and major holidays. You have access to a nurse at all times for urgent questions. Please call the main number to the clinic [phone] and follow the prompts.  For any non-urgent questions, you may also contact your provider using MyChart. We now offer e-Visits for anyone 18 and older to request care online for non-urgent symptoms. For details visit [email or web address].   Also download the MyChart app! Go to the app store, search "MyChart", open the app, select [facility], and log in with your MyChart username and password.   

## 2022-06-14 ENCOUNTER — Inpatient Hospital Stay: Payer: 59 | Attending: Hematology

## 2022-06-14 VITALS — BP 121/52 | HR 57 | Temp 98.2°F | Resp 16

## 2022-06-14 DIAGNOSIS — Z86718 Personal history of other venous thrombosis and embolism: Secondary | ICD-10-CM | POA: Diagnosis not present

## 2022-06-14 DIAGNOSIS — Z79899 Other long term (current) drug therapy: Secondary | ICD-10-CM | POA: Insufficient documentation

## 2022-06-14 DIAGNOSIS — R718 Other abnormality of red blood cells: Secondary | ICD-10-CM

## 2022-06-14 DIAGNOSIS — D509 Iron deficiency anemia, unspecified: Secondary | ICD-10-CM | POA: Diagnosis present

## 2022-06-14 DIAGNOSIS — R5383 Other fatigue: Secondary | ICD-10-CM | POA: Insufficient documentation

## 2022-06-14 MED ORDER — SODIUM CHLORIDE 0.9 % IV SOLN: Freq: Once | INTRAVENOUS | Status: AC

## 2022-06-14 MED ORDER — ACETAMINOPHEN 325 MG PO TABS
650.0000 mg | ORAL_TABLET | Freq: Once | ORAL | Status: AC
  Administered 2022-06-14: 650 mg via ORAL
  Filled 2022-06-14: qty 2

## 2022-06-14 MED ORDER — CETIRIZINE HCL 10 MG PO TABS
10.0000 mg | ORAL_TABLET | Freq: Once | ORAL | Status: AC
  Administered 2022-06-14: 10 mg via ORAL
  Filled 2022-06-14: qty 1

## 2022-06-14 MED ORDER — SODIUM CHLORIDE 0.9 % IV SOLN
510.0000 mg | Freq: Once | INTRAVENOUS | Status: AC
  Administered 2022-06-14: 510 mg via INTRAVENOUS
  Filled 2022-06-14: qty 510

## 2022-06-14 NOTE — Progress Notes (Signed)
Feraheme iron infusion given per orders. Patient tolerated it well without problems. Vitals stable and discharged home from clinic ambulatory. Follow up as scheduled.

## 2022-06-14 NOTE — Progress Notes (Signed)
Patient present today for Feraheme infusion per providers order.  Vital signs WNL.  Patient has no new complaints at this time.    Peripheral IV started and blood return noted pre and

## 2022-06-24 ENCOUNTER — Ambulatory Visit
Admission: EM | Admit: 2022-06-24 | Discharge: 2022-06-24 | Disposition: A | Discharge status: Home / Self Care | Payer: 59 | Attending: Nurse Practitioner | Admitting: Nurse Practitioner

## 2022-06-24 DIAGNOSIS — R300 Dysuria: Secondary | ICD-10-CM | POA: Insufficient documentation

## 2022-06-24 LAB — POCT URINALYSIS DIP (MANUAL ENTRY)
Bilirubin, UA: NEGATIVE
Blood, UA: NEGATIVE
Glucose, UA: >=1000 mg/dL — AB
Ketones, POC UA: NEGATIVE mg/dL
Nitrite, UA: NEGATIVE
Protein Ur, POC: NEGATIVE mg/dL
Spec Grav, UA: 1.01 (ref 1.010–1.025)
Urobilinogen, UA: 0.2 E.U./dL
pH, UA: 5.5 (ref 5.0–8.0)

## 2022-06-24 MED ORDER — NITROFURANTOIN MONOHYD MACRO 100 MG PO CAPS: 100.0000 mg | ORAL_CAPSULE | Freq: Two times a day (BID) | ORAL | 0 refills | Status: AC

## 2022-06-24 NOTE — ED Triage Notes (Signed)
Pt states she had blood in her toilet after urinating yesterday, now having burning when urinating, with lower abdominal cramping.

## 2022-06-24 NOTE — Discharge Instructions (Addendum)
The urine sample today shows there are some white blood cells in your urine which could be coming from a UTI  We are sending your urine out for culture; in the meantime take Macrobid twice daily for 5 days to treat possible UTI  We will call you later this week if we need to change the antibiotic based on the urine culture  Seek care emergently if you develop fever, nausea/vomiting and are unable to keep fluids down, or inability to urinate

## 2022-06-24 NOTE — ED Provider Notes (Signed)
[facility]    CSN: [id number] Arrival date & time: 06/24/22  1453      History   Chief Complaint Chief Complaint  Patient presents with   Hematuria    HPI TORIONA DEPIES is a 67 y.o. female.   Patient presents today with daughter for 1 day history of burning with urination, hematuria, and lower abdominal pain.  She reports all symptoms have improved from yesterday.  No increased urinary frequency, urgency, voiding smaller amounts, new urinary incontinence, or foul urinary odor.  No new back or flank pain, fever, body aches or chills, nausea/vomiting, or vaginal discharge.  Has not taken anything for symptoms so far.  Patient reports history of diabetes for which she takes glipizide, Jardiance.  Does not check blood sugars on regular basis.  Last urine culture July/2023 showed E. coli, no resistances.    Past Medical History:  Diagnosis Date   Aortic stenosis    Arthritis    Asthma    Cancer (HCC)    multiple skin cancers   Coronary artery disease    Diabetes mellitus without complication (HCC)    Diverticulosis    Dysrhythmia    Fluid retention    GERD (gastroesophageal reflux disease)    High cholesterol    Hyperlipidemia    Hypertension    Incomplete rotator cuff tear    Myocardial infarction (HCC)    Obesity    Osteoporosis    PAD (peripheral artery disease) (HCC)    Persistent atrial fibrillation (HCC)    PONV (postoperative nausea and vomiting)    Wears glasses    Wears partial dentures    bottom    Patient Active Problem List   Diagnosis Date Noted   Lumbar radiculopathy 03/15/2022   Lactic acidosis 08/27/2021   Coronary artery disease involving native coronary artery of native heart without angina pectoris 08/27/2021   Type 2 diabetes mellitus with hyperglycemia, without long-term current use of insulin (HCC) 08/27/2021   Mixed diabetic hyperlipidemia associated with type 2 diabetes mellitus (HCC) 08/27/2021   Vaginal candidiasis  08/27/2021   SIRS (systemic inflammatory response syndrome) (HCC) 08/27/2021   Sepsis secondary to UTI (HCC) 08/26/2021   RBC microcytosis 12/20/2020   Iron deficiency anemia 12/20/2020   Atherosclerosis of native arteries of extremity with rest pain (HCC) 09/08/2020   Persistent atrial fibrillation (HCC) 07/06/2019   Secondary hypercoagulable state (HCC) 07/06/2019   Acute coronary syndrome (HCC)    Chest pain 04/26/2019   Tobacco dependence 04/26/2019   NSTEMI (non-ST elevated myocardial infarction) (HCC) 04/26/2019   Iliac vein thrombosis, left (HCC) 09/12/2017   Open angle with borderline findings and low glaucoma risk in both eyes 04/14/2017   Hypokalemia 04/02/2017   GERD (gastroesophageal reflux disease) 10/21/2016   Abdominal pain 10/21/2016   Glaucoma suspect of both eyes 09/30/2016   Extrinsic asthma without complication 07/01/2016   Oral thrush 07/01/2016   Rectocele 09/19/2015   Diverticulosis of colon without hemorrhage    Hx of adenomatous colonic polyps 12/01/2013   Pain in joint, shoulder region 06/24/2013   Muscle weakness (generalized) 06/24/2013   Shoulder joint pain 06/24/2013   Generalized muscle weakness 06/24/2013   Incomplete rotator cuff tear    Diabetes mellitus without complication (HCC)    PONV (postoperative nausea and vomiting)    Fluid retention    Essential hypertension     Past Surgical History:  Procedure Laterality Date   APPENDECTOMY     CARDIOVERSION N/A 06/03/2019   Procedure: CARDIOVERSION;  Surgeon: Laqueta Linden, MD;  Location: [facility];  Service: Cardiovascular;  Laterality: N/A;   CARDIOVERSION N/A 07/13/2019   Procedure: CARDIOVERSION;  Surgeon: Sande Rives, MD;  Location: [facility] ENDOSCOPY;  Service: Cardiovascular;  Laterality: N/A;   CARPAL TUNNEL RELEASE Bilateral 2001   bilateral   CERVICAL POLYPECTOMY  04/08/2017   Procedure: POLYPECTOMY;  Surgeon: Tilda Burrow, MD;  Location: [facility];  Service: Gynecology;;   Endometrial   CHOLECYSTECTOMY     COLONOSCOPY  2007   [id number] internal hemorrhoids. Diminutive rectal polyp at 10 cm, cold biopsied/removed. The remainder of the rectal mucosa appeared normal Swallow left-sided diverticula. Diminutive polyp at the splenic flexure cold biopsied/removed (adenomatous)   COLONOSCOPY N/A 12/28/2013   Procedure: COLONOSCOPY;  Surgeon: Corbin Ade, MD;  Location: [facility];  Service: Endoscopy;  Laterality: N/A;  730 - moved to 8:30 - Ginger notified pt   CORONARY STENT INTERVENTION N/A 04/27/2019   Procedure: CORONARY STENT INTERVENTION;  Surgeon: Lennette Bihari, MD;  Location: [facility] INVASIVE CV LAB;  Service: Cardiovascular;  Laterality: N/A;   CORONARY ULTRASOUND/IVUS N/A 09/12/2017   Procedure: INTRAVASCULAR ULTRASOUND/IVUS;  Surgeon: Sherren Kerns, MD;  Location: [facility];  Service: Cardiovascular;  Laterality: N/A;   DILATION AND CURETTAGE, DIAGNOSTIC / THERAPEUTIC  03/07/2017   ERCP  2002   GASTRIC BYPASS  2004   HYSTEROSCOPY WITH D & C N/A 04/08/2017   Procedure: DILATATION AND CURETTAGE /HYSTEROSCOPY;  Surgeon: Tilda Burrow, MD;  Location: [facility];  Service: Gynecology;  Laterality: N/A;   LEFT HEART CATH AND CORONARY ANGIOGRAPHY N/A 04/27/2019   Procedure: LEFT HEART CATH AND CORONARY ANGIOGRAPHY;  Surgeon: Lennette Bihari, MD;  Location: [facility];  Service: Cardiovascular;  Laterality: N/A;   LOWER EXTREMITY ANGIOGRAPHY Left 10/02/2020   Procedure: LOWER EXTREMITY ANGIOGRAPHY;  Surgeon: Annice Needy, MD;  Location: [facility] INVASIVE CV LAB;  Service: Cardiovascular;  Laterality: Left;   LUMBAR LAMINECTOMY/DECOMPRESSION MICRODISCECTOMY Right 03/15/2022   Procedure: Microdiscectomy - right - Lumbar Four-Lumbar Five extraforaminal;  Surgeon: Julio Sicks, MD;  Location[facility] OR;  Service: Neurosurgery;  Laterality: Right;   NECK SURGERY  1998   fusion   PERCUTANEOUS VENOUS THROMBECTOMY,LYSIS WITH INTRAVASCULAR ULTRASOUND (IVUS) Left  09/13/2017   Procedure: REMOVAL OF LYSIS CATHETER AND INTRAVASCULAR ULTRASOUND (IVUS) LEFT ILIAC VEIN;  Surgeon: Sherren Kerns, MD;  Location: [facility];  Service: Vascular;  Laterality: Left;   PERIPHERAL VASCULAR INTERVENTION  09/15/2017   Procedure: PERIPHERAL VASCULAR INTERVENTION;  Surgeon: Maeola Harman, MD;  Location: [facility] INVASIVE CV LAB;  Service: Cardiovascular;;  VEINOUS/STENT   PERIPHERAL VASCULAR THROMBECTOMY Left 09/12/2017   Procedure: PERIPHERAL VASCULAR THROMBECTOMY;  Surgeon: Sherren Kerns, MD;  Location: [facility];  Service: Cardiovascular;  Laterality: Left;   PERIPHERAL VASCULAR THROMBECTOMY Left 09/15/2017   Procedure: PERIPHERAL VASCULAR THROMBECTOMY;  Surgeon: Maeola Harman, MD;  Location: [facility] INVASIVE CV LAB;  Service: Cardiovascular;  Laterality: Left;  IVC TO LT FEM/POP VEIN   SHOULDER ARTHROSCOPY WITH SUBACROMIAL DECOMPRESSION Left 06/23/2013   Procedure: LEFT SHOULDER ARTHROSCOPY WITH DEBRIDEMENT ROTATOR CUFF AND LABRUM;  Surgeon: Nilda Simmer, MD;  Location: [facility];  Service: Orthopedics;  Laterality: Left;   SHOULDER SURGERY  1999   left    OB History     Gravida  2   Para  2   Term  2   Preterm      AB  Living  2      SAB      IAB      Ectopic      Multiple      Live Births  2            Home Medications    Prior to Admission medications   Medication Sig Start Date End Date Taking? Authorizing Provider  alendronate (FOSAMAX) 70 MG tablet Take 70 mg by mouth every Sunday. 09/28/21  Yes [provider]  atorvastatin (LIPITOR) 80 MG tablet Take 1 tablet (80 mg total) by mouth daily. 12/24/21  Yes Branch, Dorothe Pea, MD  BD PEN NEEDLE NANO U/F 32G X 4 MM MISC USE FOR VICTOZA INJECTIONS 08/03/18  Yes [provider]  Cholecalciferol (VITAMIN D) 50 MCG (2000 UT) CAPS Take 2,000 Units by mouth daily.   Yes [provider]  clopidogrel (PLAVIX) 75 MG tablet Take 1  tablet (75 mg total) by mouth daily with breakfast. 12/24/21  Yes Branch, Dorothe Pea, MD  cyanocobalamin (VITAMIN B12) 500 MCG tablet Take 500 mcg by mouth daily.   Yes [provider]  dofetilide (TIKOSYN) 250 MCG capsule TAKE 1 CAPSULE BY MOUTH 2 TIMES DAILY. 05/22/22  Yes BranchDorothe Pea, MD  fluticasone (FLONASE) 50 MCG/ACT nasal spray Place 2 sprays into both nostrils daily as needed for rhinitis (congestion/ dryness). 06/29/18  Yes [provider]  glipiZIDE (GLUCOTROL XL) 5 MG 24 hr tablet Take 5 mg by mouth every morning. 02/12/22  Yes [provider]  JARDIANCE 10 MG TABS tablet Take 10 mg by mouth daily. 02/11/21  Yes [provider]  Lancets (ONETOUCH DELICA PLUS LANCET33G) MISC 1 each daily. 09/03/21  Yes [provider]  losartan (COZAAR) 100 MG tablet Take 100 mg by mouth daily with breakfast.    Yes [provider]  nitrofurantoin, macrocrystal-monohydrate, (MACROBID) 100 MG capsule Take 1 capsule (100 mg total) by mouth 2 (two) times daily for 5 days. 06/24/22 06/29/22 Yes Valentino Nose, NP  potassium chloride SA (KLOR-CON) 20 MEQ tablet Take 1 tablet (20 mEq total) by mouth 2 (two) times daily. Patient taking differently: Take 20 mEq by mouth in the morning, at noon, and at bedtime. 09/23/19  Yes Graciella Freer, PA-C  rivaroxaban (XARELTO) 20 MG TABS tablet Take 1 tablet (20 mg total) by mouth daily with supper. Patient taking differently: Take 20 mg by mouth at bedtime. 12/24/21  Yes Branch, Dorothe Pea, MD  Skin Protectants, Misc. (EUCERIN) cream Apply 1 Application topically daily.   Yes [provider]  spironolactone (ALDACTONE) 25 MG tablet Take 25 mg by mouth daily with breakfast.    Yes [provider]  torsemide (DEMADEX) 20 MG tablet Take 60 mg by mouth daily after breakfast.    Yes [provider]  VICTOZA 18 MG/3ML SOPN Inject 1.8 mg into the skin at bedtime.  08/04/18  Yes [provider]  albuterol (VENTOLIN HFA) 108 (90 Base) MCG/ACT inhaler Inhale 2 puffs into the lungs every 6 (six) hours as needed for wheezing or shortness of breath.    [provider]  cyclobenzaprine (FLEXERIL) 10 MG tablet Take 1 tablet (10 mg total) by mouth 3 (three) times daily as needed for muscle spasms. 03/16/22   Barnett Abu, MD  HYDROcodone-acetaminophen (NORCO) 10-325 MG tablet Take 1-2 tablets by mouth every 6 (six) hours as needed for severe pain ((score 7 to 10)). 03/16/22   Barnett Abu, MD  nitroGLYCERIN (NITROSTAT)  0.4 MG SL tablet Place 1 tablet (0.4 mg total) under the tongue every 5 (five) minutes as needed for chest pain. 04/28/19   Albertine Grates, MD    Family History Family History  Problem Relation Age of Onset   Diabetes Father    Hypertension Father    Parkinson's disease Father    Cancer Father        not sure what kind   Hypertension Mother    Heart disease Mother    Drug abuse Sister    Mesothelioma Brother    Cancer Daughter        non-hodgkins lmphoma   Hypertension Brother    Diabetes Brother    Colon cancer Neg Hx    Gastric cancer Neg Hx    Esophageal cancer Neg Hx     Social History Social History   Tobacco Use   Smoking status: Every Day    Packs/day: 1.50    Years: 29.00    Additional pack years: 0.00    Total pack years: 43.50    Types: Cigarettes    Start date: 04/09/1970   Smokeless tobacco: Never   Tobacco comments:    09/04/20 - states she is down to 9 ciggs / day   Vaping Use   Vaping Use: Never used  Substance Use Topics   Alcohol use: Not Currently   Drug use: No     Allergies   Penicillins and Doxycycline   Review of Systems Review of Systems Per HPI  Physical Exam Triage Vital Signs ED Triage Vitals  Enc Vitals Group     BP 06/24/22 1518 113/72     Pulse Rate 06/24/22 1518 (!) 52     Resp 06/24/22 1518 18     Temp 06/24/22 1518 98 F (36.7 C)     Temp Source 06/24/22 1518 Oral     SpO2 06/24/22 1518  95 %     Weight --      Height --      Head Circumference --      Peak Flow --      Pain Score 06/24/22 1519 0     Pain Loc --      Pain Edu? --      Excl. in GC? --    No data found.  Updated Vital Signs BP 113/72 (BP Location: Right Arm)   Pulse (!) 52   Temp 98 F (36.7 C) (Oral)   Resp 18   SpO2 95%   Visual Acuity Right Eye Distance:   Left Eye Distance:   Bilateral Distance:    Right Eye Near:   Left Eye Near:    Bilateral Near:     Physical Exam Vitals and nursing note reviewed.  Constitutional:      General: She is not in acute distress.    Appearance: She is not toxic-appearing.  Pulmonary:     Effort: Pulmonary effort is normal. No respiratory distress.  Abdominal:     General: Abdomen is flat. Bowel sounds are normal. There is no distension.     Palpations: Abdomen is soft. There is no mass.     Tenderness: There is no abdominal tenderness. There is no right CVA tenderness, left CVA tenderness or guarding.  Skin:    General: Skin is warm and dry.     Coloration: Skin is not jaundiced or pale.     Findings: No erythema.  Neurological:     Mental Status: She is alert and oriented to  person, place, and time.     Motor: No weakness.     Gait: Gait normal.  Psychiatric:        Behavior: Behavior is cooperative.      UC Treatments / Results  Labs (all labs ordered are listed, but only abnormal results are displayed) Labs Reviewed  POCT URINALYSIS DIP (MANUAL ENTRY) - Abnormal; Notable for the following components:      Result Value   Color, UA light yellow (*)    Glucose, UA >=1,000 (*)    Leukocytes, UA Trace (*)    All other components within normal limits  URINE CULTURE    EKG   Radiology No results found.  Procedures Procedures (including critical care time)  Medications Ordered in UC Medications - No data to display  Initial Impression / Assessment and Plan / UC Course  I have reviewed the triage vital signs and the nursing  notes.  Pertinent labs & imaging results that were available during my care of the patient were reviewed by me and considered in my medical decision making (see chart for details).   Patient is well-appearing, normotensive, afebrile, not tachycardic, not tachypneic, oxygenating well on room air.    1. Dysuria Urinalysis remarkable for trace leukocyte Estrace Urine culture is pending There is glucose in urine which is likely from [place] use Will treat with Macrobid twice daily for 5 days-patient is allergic to penicillins with hives/rash and therefore will not prescribe cephalosporins Bactrim is contraindicated secondary to Tikosyn use Strict ER and return precautions discussed with patient  The patient was given the opportunity to ask questions.  All questions answered to their satisfaction.  The patient is in agreement to this plan.    Final Clinical Impressions(s) / UC Diagnoses   Final diagnoses:  Dysuria     Discharge Instructions      The urine sample today shows there are some white blood cells in your urine which could be coming from a UTI  We are sending your urine out for culture; in the meantime take Macrobid twice daily for 5 days to treat possible UTI  We will call you later this week if we need to change the antibiotic based on the urine culture  Seek care emergently if you develop fever, nausea/vomiting and are unable to keep fluids down, or inability to urinate     ED Prescriptions     Medication Sig Dispense Auth. Provider   nitrofurantoin, macrocrystal-monohydrate, (MACROBID) 100 MG capsule Take 1 capsule (100 mg total) by mouth 2 (two) times daily for 5 days. 10 capsule Valentino Nose, NP      PDMP not reviewed this encounter.   Valentino Nose, NP 06/24/22 [phone]

## 2022-06-25 ENCOUNTER — Ambulatory Visit: Payer: 59

## 2022-06-26 LAB — URINE CULTURE

## 2022-07-02 ENCOUNTER — Other Ambulatory Visit: Payer: Self-pay | Admitting: Cardiology

## 2022-07-05 NOTE — Telephone Encounter (Signed)
Prescription refill request for Xarelto received.  Indication:afib Last office visit:1/24 Weight:124  kg Age:67 Scr:0.7 CrCl:152.66  ml/min  Prescription refilled

## 2022-08-07 ENCOUNTER — Other Ambulatory Visit: Payer: Self-pay | Admitting: Neurosurgery

## 2022-08-07 DIAGNOSIS — M5416 Radiculopathy, lumbar region: Secondary | ICD-10-CM

## 2022-08-16 ENCOUNTER — Inpatient Hospital Stay: Payer: 59 | Attending: Hematology | Admitting: Physician Assistant

## 2022-08-16 DIAGNOSIS — Z9049 Acquired absence of other specified parts of digestive tract: Secondary | ICD-10-CM | POA: Insufficient documentation

## 2022-08-16 DIAGNOSIS — G8929 Other chronic pain: Secondary | ICD-10-CM | POA: Insufficient documentation

## 2022-08-16 DIAGNOSIS — G479 Sleep disorder, unspecified: Secondary | ICD-10-CM | POA: Diagnosis not present

## 2022-08-16 DIAGNOSIS — Z7902 Long term (current) use of antithrombotics/antiplatelets: Secondary | ICD-10-CM | POA: Insufficient documentation

## 2022-08-16 DIAGNOSIS — Z7901 Long term (current) use of anticoagulants: Secondary | ICD-10-CM | POA: Insufficient documentation

## 2022-08-16 DIAGNOSIS — Z881 Allergy status to other antibiotic agents status: Secondary | ICD-10-CM | POA: Diagnosis not present

## 2022-08-16 DIAGNOSIS — M549 Dorsalgia, unspecified: Secondary | ICD-10-CM | POA: Insufficient documentation

## 2022-08-16 DIAGNOSIS — Z9884 Bariatric surgery status: Secondary | ICD-10-CM | POA: Insufficient documentation

## 2022-08-16 DIAGNOSIS — J45901 Unspecified asthma with (acute) exacerbation: Secondary | ICD-10-CM | POA: Insufficient documentation

## 2022-08-16 DIAGNOSIS — E119 Type 2 diabetes mellitus without complications: Secondary | ICD-10-CM | POA: Insufficient documentation

## 2022-08-16 DIAGNOSIS — Z833 Family history of diabetes mellitus: Secondary | ICD-10-CM | POA: Insufficient documentation

## 2022-08-16 DIAGNOSIS — I10 Essential (primary) hypertension: Secondary | ICD-10-CM | POA: Insufficient documentation

## 2022-08-16 DIAGNOSIS — Z880 Allergy status to penicillin: Secondary | ICD-10-CM | POA: Diagnosis not present

## 2022-08-16 DIAGNOSIS — Z8719 Personal history of other diseases of the digestive system: Secondary | ICD-10-CM | POA: Diagnosis not present

## 2022-08-16 DIAGNOSIS — Z8639 Personal history of other endocrine, nutritional and metabolic disease: Secondary | ICD-10-CM | POA: Diagnosis not present

## 2022-08-16 DIAGNOSIS — R609 Edema, unspecified: Secondary | ICD-10-CM

## 2022-08-16 DIAGNOSIS — R718 Other abnormality of red blood cells: Secondary | ICD-10-CM

## 2022-08-16 DIAGNOSIS — I2510 Atherosclerotic heart disease of native coronary artery without angina pectoris: Secondary | ICD-10-CM | POA: Diagnosis not present

## 2022-08-16 DIAGNOSIS — F17210 Nicotine dependence, cigarettes, uncomplicated: Secondary | ICD-10-CM | POA: Insufficient documentation

## 2022-08-16 DIAGNOSIS — Z86718 Personal history of other venous thrombosis and embolism: Secondary | ICD-10-CM | POA: Diagnosis not present

## 2022-08-16 DIAGNOSIS — Z86010 Personal history of colonic polyps: Secondary | ICD-10-CM | POA: Insufficient documentation

## 2022-08-16 DIAGNOSIS — Z814 Family history of other substance abuse and dependence: Secondary | ICD-10-CM | POA: Insufficient documentation

## 2022-08-16 DIAGNOSIS — I4819 Other persistent atrial fibrillation: Secondary | ICD-10-CM | POA: Insufficient documentation

## 2022-08-16 DIAGNOSIS — Z818 Family history of other mental and behavioral disorders: Secondary | ICD-10-CM | POA: Insufficient documentation

## 2022-08-16 DIAGNOSIS — D509 Iron deficiency anemia, unspecified: Secondary | ICD-10-CM

## 2022-08-16 DIAGNOSIS — M2550 Pain in unspecified joint: Secondary | ICD-10-CM | POA: Diagnosis not present

## 2022-08-16 DIAGNOSIS — R5383 Other fatigue: Secondary | ICD-10-CM | POA: Diagnosis not present

## 2022-08-16 DIAGNOSIS — Z808 Family history of malignant neoplasm of other organs or systems: Secondary | ICD-10-CM | POA: Insufficient documentation

## 2022-08-16 DIAGNOSIS — Z809 Family history of malignant neoplasm, unspecified: Secondary | ICD-10-CM | POA: Insufficient documentation

## 2022-08-16 DIAGNOSIS — Z8249 Family history of ischemic heart disease and other diseases of the circulatory system: Secondary | ICD-10-CM | POA: Insufficient documentation

## 2022-08-16 DIAGNOSIS — Z79899 Other long term (current) drug therapy: Secondary | ICD-10-CM | POA: Insufficient documentation

## 2022-08-16 DIAGNOSIS — R519 Headache, unspecified: Secondary | ICD-10-CM | POA: Diagnosis not present

## 2022-08-16 DIAGNOSIS — R0602 Shortness of breath: Secondary | ICD-10-CM | POA: Diagnosis not present

## 2022-08-16 LAB — HEMOGLOBIN A1C
Hgb A1c MFr Bld: 7 % — ABNORMAL HIGH (ref 4.8–5.6)
Mean Plasma Glucose: 154.2 mg/dL

## 2022-08-16 LAB — CBC WITH DIFFERENTIAL/PLATELET
Abs Immature Granulocytes: 0.03 10*3/uL (ref 0.00–0.07)
Basophils Absolute: 0 10*3/uL (ref 0.0–0.1)
Basophils Relative: 0 %
Eosinophils Absolute: 0.1 10*3/uL (ref 0.0–0.5)
Eosinophils Relative: 2 %
HCT: 40.2 % (ref 36.0–46.0)
Hemoglobin: 13.4 g/dL (ref 12.0–15.0)
Immature Granulocytes: 0 %
Lymphocytes Relative: 24 %
Lymphs Abs: 2 10*3/uL (ref 0.7–4.0)
MCH: 30.4 pg (ref 26.0–34.0)
MCHC: 33.3 g/dL (ref 30.0–36.0)
MCV: 91.2 fL (ref 80.0–100.0)
Monocytes Absolute: 0.9 10*3/uL (ref 0.1–1.0)
Monocytes Relative: 11 %
Neutro Abs: 5.4 10*3/uL (ref 1.7–7.7)
Neutrophils Relative %: 63 %
Platelets: 237 10*3/uL (ref 150–400)
RBC: 4.41 MIL/uL (ref 3.87–5.11)
RDW: 15.2 % (ref 11.5–15.5)
WBC: 8.5 10*3/uL (ref 4.0–10.5)
nRBC: 0 % (ref 0.0–0.2)

## 2022-08-16 LAB — COMPREHENSIVE METABOLIC PANEL
ALT: 14 U/L (ref 0–44)
AST: 16 U/L (ref 15–41)
Albumin: 3.9 g/dL (ref 3.5–5.0)
Alkaline Phosphatase: 57 U/L (ref 38–126)
Anion gap: 10 (ref 5–15)
BUN: 14 mg/dL (ref 8–23)
CO2: 23 mmol/L (ref 22–32)
Calcium: 8.4 mg/dL — ABNORMAL LOW (ref 8.9–10.3)
Chloride: 101 mmol/L (ref 98–111)
Creatinine, Ser: 0.79 mg/dL (ref 0.44–1.00)
GFR, Estimated: >60 mL/min (ref >60)
Glucose, Bld: 121 mg/dL — ABNORMAL HIGH (ref 70–99)
Potassium: 3.1 mmol/L — ABNORMAL LOW (ref 3.5–5.1)
Sodium: 134 mmol/L — ABNORMAL LOW (ref 135–145)
Total Bilirubin: 1.3 mg/dL — ABNORMAL HIGH (ref 0.3–1.2)
Total Protein: 7.5 g/dL (ref 6.5–8.1)

## 2022-08-16 LAB — BASIC METABOLIC PANEL
Anion gap: 9 (ref 5–15)
BUN: 13 mg/dL (ref 8–23)
CO2: 24 mmol/L (ref 22–32)
Calcium: 8.4 mg/dL — ABNORMAL LOW (ref 8.9–10.3)
Chloride: 101 mmol/L (ref 98–111)
Creatinine, Ser: 0.78 mg/dL (ref 0.44–1.00)
GFR, Estimated: >60 mL/min (ref >60)
Glucose, Bld: 122 mg/dL — ABNORMAL HIGH (ref 70–99)
Potassium: 3.1 mmol/L — ABNORMAL LOW (ref 3.5–5.1)
Sodium: 134 mmol/L — ABNORMAL LOW (ref 135–145)

## 2022-08-16 LAB — IRON AND TIBC
Iron: 65 ug/dL (ref 28–170)
Saturation Ratios: 18 % (ref 10.4–31.8)
TIBC: 369 ug/dL (ref 250–450)
UIBC: 304 ug/dL

## 2022-08-16 LAB — FOLATE: Folate: 8 ng/mL (ref >5.9)

## 2022-08-16 LAB — LIPID PANEL
Cholesterol: 100 mg/dL (ref 0–200)
HDL: 34 mg/dL — ABNORMAL LOW (ref >40)
LDL Cholesterol: 45 mg/dL (ref 0–99)
Total CHOL/HDL Ratio: 2.9 RATIO
Triglycerides: 107 mg/dL (ref <150)
VLDL: 21 mg/dL (ref 0–40)

## 2022-08-16 LAB — FERRITIN: Ferritin: 193 ng/mL (ref 11–307)

## 2022-08-16 LAB — VITAMIN B12: Vitamin B-12: 1428 pg/mL — ABNORMAL HIGH (ref 180–914)

## 2022-08-16 LAB — VITAMIN D 25 HYDROXY (VIT D DEFICIENCY, FRACTURES): Vit D, 25-Hydroxy: 30.55 ng/mL (ref 30–100)

## 2022-08-18 LAB — MICROALBUMIN / CREATININE URINE RATIO
Creatinine, Urine: 28.7 mg/dL
Microalb Creat Ratio: <10 mg/g creat (ref 0–29)
Microalb, Ur: <3 ug/mL — ABNORMAL HIGH

## 2022-08-19 LAB — METHYLMALONIC ACID, SERUM: Methylmalonic Acid, Quantitative: 190 nmol/L (ref 0–378)

## 2022-08-22 NOTE — Progress Notes (Deleted)
RESCHEDULED

## 2022-08-23 ENCOUNTER — Inpatient Hospital Stay: Payer: 59 | Admitting: Physician Assistant

## 2022-08-23 NOTE — Progress Notes (Signed)
[facility] [address], [place] [address]   CLINIC:  Medical Oncology/Hematology  PCP:  Carylon Perches, MD [address] [place] [address] [phone]   REASON FOR VISIT:  Follow-up for iron deficiency anemia   PRIOR THERAPY: Oral iron tablets   CURRENT THERAPY: Intermittent IV iron infusions  INTERVAL HISTORY:  Amy Tran returns today for follow-up of iron deficiency anemia.  She was last evaluated via telemedicine visit by Rojelio Brenner PA-C on 04/19/2022.  She had IV Feraheme x 2 on 06/07/2022 and 06/14/2022.    At today's visit, she reports feeling fair.  Overall, she felt about the same after her IV iron.  She "feels tired all the time" and "sluggish."   She reports ice cravings. She continues to take Xarelto for her history of DVT and her A-fib.  She has not noticed any signs of blood loss such as epistaxis, bright red blood per rectum, or melena.  She has occasional "normal" headaches. She is having some dyspnea on exertion that she relates to asthma exacerbation brought on by summer heat.  She denies any chest pain, lightheadedness, or syncope. She has little to no energy and 70% appetite. She endorses that she is maintaining a stable weight.  ASSESSMENT & PLAN:  1.  Iron deficiency state: - Patient seen at the request of Dr. Ouida Sills for further management of iron deficiency state. - Labs from 12/11/2020 showed hemoglobin 12.4, MCV 78, low MCH and MCHC and high RDW consistent with iron deficiency state.  Ferritin was 22. - She could not tolerate iron tablets in the past which caused diarrhea.  Denies any prior history of transfusion. - Last colonoscopy on 12/28/2013: Colonic diverticulosis. - She is on Xarelto and Plavix for A. fib and peripheral vascular disease. - Additional work-up revealed normal vitamin B12, folate, LDH, SPEP, and reticulocytes  -- Unable to tolerate oral iron due to diarrhea - IV iron (Feraheme) most  recently given on 05/18/2022 and 06/14/2022 - She continues to have fatigue and ice cravings, which are chronic and independent of her iron levels - No bright red blood per rectum or melena - Labs (08/16/2022): Normal CBC with Hgb 13.4.  Ferritin 193.  Iron saturation 18%.  Normal folate, B12, MMA. - PLAN: No indication for IV iron at this time.   - We will recheck CBC and iron panel with phone visit in 6 months.   2.  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 - She has had leg claudication symptoms related to atherosclerosis of the native artery of the left leg with rest pain. - On 10/02/2020 she had left leg balloon angioplasty of left posterior tibial artery, mechanical thrombectomy of the proximal left popliteal artery, and balloon angioplasty of proximal left popliteal artery, and stent placement to proximal left popliteal artery. - She follows with [facility] for her extensive peripheral vascular disease  - She is taking Xarelto and Plavix    - Exam at last office visit showed chronic lymphedema of left leg with left calf circumference markedly larger than right leg.  Patient states that this is chronic and unchanged.   -- She continues to follow closely with  vascular surgery.  Most recent venous duplex on 06/20/2021 of right leg was negative for DVT. - PLAN: Patient instructed to continue Xarelto and to continue follow-up with her established vein and vascular specialists.   3. Social/family history: - Lives at home with her daughter.  Independent of ADLs and IADLs.  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  She started smoking at age  78, 2 to 3 packs/day and quit for 16 years.  Restarted back again 5 years back when her husband died.  She is currently smoking 10 cigarettes/day now. - Father, maternal grandfather and maternal grandmother had cancers.  Type unknown to the patient.  PLAN SUMMARY: >> Labs in 6 months = CBC/D, CMP, ferritin, iron/TIBC >> OFFICE visit in 6 months (1 week after labs)     REVIEW OF SYSTEMS:   Review of Systems  Constitutional:  Positive for fatigue. Negative for appetite change, chills, diaphoresis, fever and unexpected weight change.  HENT:   Negative for lump/mass and nosebleeds.   Eyes:  Negative for eye problems.  Respiratory:  Positive for shortness of breath. Negative for cough and hemoptysis.   Cardiovascular:  Negative for chest pain, leg swelling and palpitations.  Gastrointestinal:  Positive for diarrhea. Negative for abdominal pain, blood in stool, constipation, nausea and vomiting.  Genitourinary:  Negative for hematuria.   Musculoskeletal:  Positive for arthralgias and back pain.  Skin: Negative.   Neurological:  Positive for numbness. Negative for dizziness, headaches and light-headedness.  Hematological:  Does not bruise/bleed easily.  Psychiatric/Behavioral:  Positive for sleep disturbance.      PHYSICAL EXAM:  ECOG PERFORMANCE STATUS: 1 - Symptomatic but completely ambulatory  There were no vitals filed for this visit. There were no vitals filed for this visit. Physical Exam Constitutional:      Appearance: Normal appearance. She is morbidly obese.  HENT:     Head: Normocephalic and atraumatic.     Mouth/Throat:     Mouth: Mucous membranes are moist.  Eyes:     Extraocular Movements: Extraocular movements intact.     Pupils: Pupils are equal, round, and reactive to light.  Cardiovascular:     Rate and Rhythm: Normal rate and regular rhythm.     Pulses: Normal pulses.     Heart sounds: Murmur heard.  Pulmonary:     Effort: Pulmonary effort is normal.      Breath sounds: Normal breath sounds.  Abdominal:     General: Bowel sounds are normal.     Palpations: Abdomen is soft.     Tenderness: There is no abdominal tenderness.  Musculoskeletal:        General: No swelling.     Right lower leg: Edema present.     Left lower leg: Edema (chronic lymphedema of bilateral lower extremities) present.  Lymphadenopathy:     Cervical: No cervical adenopathy.  Skin:    General: Skin is warm and dry.     Comments: Dusky erythema of bilateral lower extremities secondary to venous stasis dermatitis   Neurological:     General: No focal deficit present.     Mental Status: She is alert and oriented to person, place, and time.  Psychiatric:        Mood and Affect: Mood normal.        Behavior: Behavior normal.     PAST MEDICAL/SURGICAL HISTORY:  Past Medical History:  Diagnosis Date   Aortic stenosis    Arthritis    Asthma  Cancer [facility])    multiple skin cancers   Coronary artery disease    Diabetes mellitus without complication (HCC)    Diverticulosis    Dysrhythmia    Fluid retention    GERD (gastroesophageal reflux disease)    High cholesterol    Hyperlipidemia    Hypertension    Incomplete rotator cuff tear    Myocardial infarction (HCC)    Obesity    Osteoporosis    PAD (peripheral artery disease) (HCC)    Persistent atrial fibrillation (HCC)    PONV (postoperative nausea and vomiting)    Wears glasses    Wears partial dentures    bottom   Past Surgical History:  Procedure Laterality Date   APPENDECTOMY     CARDIOVERSION N/A 06/03/2019   Procedure: CARDIOVERSION;  Surgeon: Laqueta Linden, MD;  Location: [facility] ORS;  Service: Cardiovascular;  Laterality: N/A;   CARDIOVERSION N/A 07/13/2019   Procedure: CARDIOVERSION;  Surgeon: Sande Rives, MD;  Location: [facility] ENDOSCOPY;  Service: Cardiovascular;  Laterality: N/A;   CARPAL TUNNEL RELEASE Bilateral 2001   bilateral   CERVICAL POLYPECTOMY  04/08/2017   Procedure:  POLYPECTOMY;  Surgeon: Tilda Burrow, MD;  Location: [facility] ORS;  Service: Gynecology;;  Endometrial   CHOLECYSTECTOMY     COLONOSCOPY  2007   [id number] internal hemorrhoids. Diminutive rectal polyp at 10 cm, cold biopsied/removed. The remainder of the rectal mucosa appeared normal Swallow left-sided diverticula. Diminutive polyp at the splenic flexure cold biopsied/removed (adenomatous)   COLONOSCOPY N/A 12/28/2013   Procedure: COLONOSCOPY;  Surgeon: Corbin Ade, MD;  Location: [facility] ENDO SUITE;  Service: Endoscopy;  Laterality: N/A;  730 - moved to 8:30 - Ginger notified pt   CORONARY STENT INTERVENTION N/A 04/27/2019   Procedure: CORONARY STENT INTERVENTION;  Surgeon: Lennette Bihari, MD;  Location: [facility] INVASIVE CV LAB;  Service: Cardiovascular;  Laterality: N/A;   CORONARY ULTRASOUND/IVUS N/A 09/12/2017   Procedure: INTRAVASCULAR ULTRASOUND/IVUS;  Surgeon: Sherren Kerns, MD;  Location: [facility] INVASIVE CV LAB;  Service: Cardiovascular;  Laterality: N/A;   DILATION AND CURETTAGE, DIAGNOSTIC / THERAPEUTIC  03/07/2017   ERCP  2002   GASTRIC BYPASS  2004   HYSTEROSCOPY WITH D & C N/A 04/08/2017   Procedure: DILATATION AND CURETTAGE /HYSTEROSCOPY;  Surgeon: Tilda Burrow, MD;  Location: [facility] ORS;  Service: Gynecology;  Laterality: N/A;   LEFT HEART CATH AND CORONARY ANGIOGRAPHY N/A 04/27/2019   Procedure: LEFT HEART CATH AND CORONARY ANGIOGRAPHY;  Surgeon: Lennette Bihari, MD;  Location: [facility] INVASIVE CV LAB;  Service: Cardiovascular;  Laterality: N/A;   LOWER EXTREMITY ANGIOGRAPHY Left 10/02/2020   Procedure: LOWER EXTREMITY ANGIOGRAPHY;  Surgeon: Annice Needy, MD;  Location: [facility] INVASIVE CV LAB;  Service: Cardiovascular;  Laterality: Left;   LUMBAR LAMINECTOMY/DECOMPRESSION MICRODISCECTOMY Right 03/15/2022   Procedure: Microdiscectomy - right - Lumbar Four-Lumbar Five extraforaminal;  Surgeon: Julio Sicks, MD;  Location: [facility] OR;  Service: Neurosurgery;  Laterality: Right;   NECK SURGERY  1998   fusion    PERCUTANEOUS VENOUS THROMBECTOMY,LYSIS WITH INTRAVASCULAR ULTRASOUND (IVUS) Left 09/13/2017   Procedure: REMOVAL OF LYSIS CATHETER AND INTRAVASCULAR ULTRASOUND (IVUS) LEFT ILIAC VEIN;  Surgeon: Sherren Kerns, MD;  Location: [facility] OR;  Service: Vascular;  Laterality: Left;   PERIPHERAL VASCULAR INTERVENTION  09/15/2017   Procedure: PERIPHERAL VASCULAR INTERVENTION;  Surgeon: Maeola Harman, MD;  Location: [facility] INVASIVE CV LAB;  Service: Cardiovascular;;  VEINOUS/STENT   PERIPHERAL VASCULAR THROMBECTOMY Left 09/12/2017   Procedure: PERIPHERAL VASCULAR THROMBECTOMY;  Surgeon: Sherren Kerns, MD;  Location: [facility] INVASIVE CV LAB;  Service: Cardiovascular;  Laterality: Left;   PERIPHERAL VASCULAR THROMBECTOMY Left 09/15/2017   Procedure: PERIPHERAL VASCULAR THROMBECTOMY;  Surgeon: Maeola Harman, MD;  Location: [facility] INVASIVE CV LAB;  Service: Cardiovascular;  Laterality: Left;  IVC TO LT FEM/POP VEIN   SHOULDER ARTHROSCOPY WITH SUBACROMIAL DECOMPRESSION Left 06/23/2013   Procedure: LEFT SHOULDER ARTHROSCOPY WITH DEBRIDEMENT ROTATOR CUFF AND LABRUM;  Surgeon: Nilda Simmer, MD;  Location: [facility];  Service: Orthopedics;  Laterality: Left;   SHOULDER SURGERY  1999   left    SOCIAL HISTORY:  Social History   Socioeconomic History   Marital status: Widowed    Spouse name: Not on file   Number of children: 2   Years of education: Not on file   Highest education level: Not on file  Occupational History   Occupation: works at [employer] store    Employer: [employer]   Tobacco Use   Smoking status: Every Day    Current packs/day: 1.50    Average packs/day: 1.5 packs/day for 52.4 years (78.6 ttl pk-yrs)    Types: Cigarettes    Start date: 04/09/1970   Smokeless tobacco: Never   Tobacco comments:    09/04/20 - states she is down to 9 ciggs / day   Vaping Use   Vaping status: Never Used  Substance and Sexual Activity   Alcohol use: Not Currently   Drug use: No   Sexual  activity: Not Currently    Birth control/protection: Post-menopausal  Other Topics Concern   Not on file  Social History Narrative   Not on file   Social Determinants of Health   Financial Resource Strain: Not on file  Food Insecurity: Not on file  Transportation Needs: Not on file  Physical Activity: Unknown (09/15/2017)   Exercise Vital Sign    Days of Exercise per Week: Patient declined    Minutes of Exercise per Session: Patient declined  Stress: Unknown (09/15/2017)   [employer] of Occupational Health - Occupational Stress Questionnaire    Feeling of Stress : Patient declined  Social Connections: Unknown (09/15/2017)   Social Connection and Isolation Panel [NHANES]    Frequency of Communication with Friends and Family: Patient declined    Frequency of Social Gatherings with Friends and Family: Patient declined    Attends Religious Services: Patient declined    Database administrator or Organizations: Patient declined    Attends Banker Meetings: Patient declined    Marital Status: Patient declined  Intimate Partner Violence: Unknown (09/15/2017)   Humiliation, Afraid, Rape, and Kick questionnaire    Fear of Current or Ex-Partner: Patient declined    Emotionally Abused: Patient declined    Physically Abused: Patient declined    Sexually Abused: Patient declined    FAMILY HISTORY:  Family History  Problem Relation Age of Onset   Diabetes Father    Hypertension Father    Parkinson's disease Father    Cancer Father        not sure what kind   Hypertension Mother    Heart disease Mother    Drug abuse Sister    Mesothelioma Brother    Cancer Daughter        non-hodgkins lmphoma   Hypertension Brother    Diabetes Brother    Colon cancer Neg Hx    Gastric cancer Neg Hx    Esophageal cancer Neg Hx     CURRENT MEDICATIONS:  Outpatient Encounter Medications as of 08/26/2022  Medication Sig   albuterol (VENTOLIN HFA) 108 (90 Base) MCG/ACT inhaler Inhale  2 puffs into the lungs every 6 (six) hours as needed for wheezing or shortness of breath.   alendronate (FOSAMAX) 70 MG tablet Take 70 mg by mouth every Sunday.   atorvastatin (LIPITOR) 80 MG tablet Take 1 tablet (80 mg total) by mouth daily.   BD PEN NEEDLE NANO U/F 32G X 4 MM MISC USE FOR VICTOZA INJECTIONS   Cholecalciferol (VITAMIN D) 50 MCG (2000 UT) CAPS Take 2,000 Units by mouth daily.   clopidogrel (PLAVIX) 75 MG tablet Take 1 tablet (75 mg total) by mouth daily with breakfast.   cyanocobalamin (VITAMIN B12) 500 MCG tablet Take 500 mcg by mouth daily.   cyclobenzaprine (FLEXERIL) 10 MG tablet Take 1 tablet (10 mg total) by mouth 3 (three) times daily as needed for muscle spasms.   dofetilide (TIKOSYN) 250 MCG capsule TAKE 1 CAPSULE BY MOUTH 2 TIMES DAILY.   fluticasone (FLONASE) 50 MCG/ACT nasal spray Place 2 sprays into both nostrils daily as needed for rhinitis (congestion/ dryness).   glipiZIDE (GLUCOTROL XL) 5 MG 24 hr tablet Take 5 mg by mouth every morning.   HYDROcodone-acetaminophen (NORCO) 10-325 MG tablet Take 1-2 tablets by mouth every 6 (six) hours as needed for severe pain ((score 7 to 10)).   JARDIANCE 10 MG TABS tablet Take 10 mg by mouth daily.   Lancets (ONETOUCH DELICA PLUS LANCET33G) MISC 1 each daily.   losartan (COZAAR) 100 MG tablet Take 100 mg by mouth daily with breakfast.    nitroGLYCERIN (NITROSTAT) 0.4 MG SL tablet Place 1 tablet (0.4 mg total) under the tongue every 5 (five) minutes as needed for chest pain.   potassium chloride SA (KLOR-CON) 20 MEQ tablet Take 1 tablet (20 mEq total) by mouth 2 (two) times daily. (Patient taking differently: Take 20 mEq by mouth in the morning, at noon, and at bedtime.)   Skin Protectants, Misc. (EUCERIN) cream Apply 1 Application topically daily.   spironolactone (ALDACTONE) 25 MG tablet Take 25 mg by mouth daily with breakfast.    torsemide (DEMADEX) 20 MG tablet Take 60 mg by mouth daily after breakfast.    VICTOZA 18  MG/3ML SOPN Inject 1.8 mg into the skin at bedtime.    XARELTO 20 MG TABS tablet TAKE ONE TABLET (20MG  TOTAL) BY MOUTH DAILY AT 5PM WITH SUPPER   No facility-administered encounter medications on file as of 08/26/2022.    ALLERGIES:  Allergies  Allergen Reactions   Penicillins Hives and Rash    Has patient had a PCN reaction causing immediate rash, facial/tongue/throat swelling, SOB or lightheadedness with hypotension: Yes Has patient had a PCN reaction causing severe rash involving mucus membranes or skin necrosis: No Has patient had a PCN reaction that required hospitalization No Has patient had a PCN reaction occurring within the last 10 years: No If all of the above answers are "NO", then may proceed with Cephalosporin use.    Doxycycline Itching    LABORATORY DATA:  I have reviewed the labs as listed.  CBC    Component Value Date/Time   WBC 8.5 08/16/2022 1247   RBC 4.41 08/16/2022 1247   HGB 13.4 08/16/2022 1247   HGB 12.2 06/02/2020 0946   HCT 40.2 08/16/2022 1247   HCT 38.0 06/02/2020 0946   PLT 237 08/16/2022 1247   PLT 329 06/02/2020 0946   MCV 91.2 08/16/2022 1247   MCV 83 06/02/2020  0946   MCH 30.4 08/16/2022 1247   MCHC 33.3 08/16/2022 1247   RDW 15.2 08/16/2022 1247   RDW 14.4 06/02/2020 0946   LYMPHSABS 2.0 08/16/2022 1247   MONOABS 0.9 08/16/2022 1247   EOSABS 0.1 08/16/2022 1247   BASOSABS 0.0 08/16/2022 1247      Latest Ref Rng & Units 08/16/2022   12:57 PM 08/16/2022   12:47 PM 03/11/2022    1:40 PM  CMP  Glucose 70 - 99 mg/dL 161  096  045   BUN 8 - 23 mg/dL 13  14  13    Creatinine 0.44 - 1.00 mg/dL 4.09  8.11  9.14   Sodium 135 - 145 mmol/L 134  134  135   Potassium 3.5 - 5.1 mmol/L 3.1  3.1  3.9   Chloride 98 - 111 mmol/L 101  101  104   CO2 22 - 32 mmol/L 24  23  27    Calcium 8.9 - 10.3 mg/dL 8.4  8.4  9.1   Total Protein 6.5 - 8.1 g/dL  7.5    Total Bilirubin 0.3 - 1.2 mg/dL  1.3    Alkaline Phos 38 - 126 U/L  57    AST 15 - 41 U/L  16     ALT 0 - 44 U/L  14      DIAGNOSTIC IMAGING:  I have independently reviewed the relevant imaging and discussed with the patient.   WRAP UP:  All questions were answered. The patient knows to call the clinic with any problems, questions or concerns.  Medical decision making: Low  Time spent on visit: I spent 15 minutes counseling the patient face to face. The total time spent in the appointment was 22 minutes and more than 50% was on counseling.  Carnella Guadalajara, PA-C  08/26/22 9:30 AM

## 2022-08-26 ENCOUNTER — Inpatient Hospital Stay: Payer: 59 | Admitting: Physician Assistant

## 2022-08-26 VITALS — BP 141/64 | HR 58 | Temp 98.5°F | Resp 16 | Wt 269.0 lb

## 2022-08-26 DIAGNOSIS — I871 Compression of vein: Secondary | ICD-10-CM

## 2022-08-26 DIAGNOSIS — D509 Iron deficiency anemia, unspecified: Secondary | ICD-10-CM | POA: Diagnosis not present

## 2022-08-26 NOTE — Patient Instructions (Signed)
[facility] at [facility] Discharge Instructions  You were seen today by Rojelio Brenner PA-C for your iron deficiency and history of DVT.  Your blood and iron levels look good.  You do not need any IV iron at this time.  We will check labs again in 6 months and see you for follow-up visit at that time.  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   ** Thank you for trusting me with your healthcare!  I strive to provide all of my patients with quality care at each visit.  If you receive a survey for this visit, I would be so grateful to you for taking the time to provide feedback.  Thank you in advance!  ~ Chaslyn Eisen                   Dr. Doreatha Massed   &   Rojelio Brenner, PA-C   - - - - - - - - - - - - - - - - - -    Thank you for choosing [facility] at [facility] to provide your oncology and hematology care.  To afford each patient quality time with our provider, please arrive at least 15 minutes before your scheduled appointment time.   If you have a lab appointment with the Cancer Center please come in thru the Main Entrance and check in at the main information desk.  You need to re-schedule your appointment should you arrive 10 or more minutes late.  We strive to give you quality time with our providers, and arriving late affects you and other patients whose appointments are after yours.  Also, if you no show three or more times for appointments you may be dismissed from the clinic at the providers discretion.     Again, thank you for choosing [facility].  Our hope is that these requests will decrease the amount of time that you wait before being seen by our physicians.       _____________________________________________________________  Should you have questions after your visit to [facility], please contact our  office at [phone] and follow the prompts.  Our office hours are 8:00 a.m. and 4:30 p.m. Monday - Friday.  Please note that voicemails left after 4:00 p.m. may not be returned until the following business day.  We are closed weekends and major holidays.  You do have access to a nurse 24-7, just call the main number to the clinic [phone] and do not press any options, hold on the line and a nurse will answer the phone.    For prescription refill requests, have your pharmacy contact our office and allow 72 hours.    Due to Covid, you will need to wear a mask upon entering the hospital. If you do not have a mask, a mask will be given to you at the Main Entrance upon arrival. For doctor visits, patients may have 1 support person age 22 or older with them. For treatment visits, patients can not have anyone with them due to social distancing guidelines and our immunocompromised population.

## 2022-08-29 ENCOUNTER — Ambulatory Visit: Payer: 59 | Attending: Cardiology | Admitting: Cardiology

## 2022-08-29 ENCOUNTER — Encounter: Payer: Self-pay | Admitting: Cardiology

## 2022-08-29 VITALS — BP 110/58 | HR 47 | Ht 65.0 in | Wt 268.0 lb

## 2022-08-29 DIAGNOSIS — I10 Essential (primary) hypertension: Secondary | ICD-10-CM

## 2022-08-29 DIAGNOSIS — J45909 Unspecified asthma, uncomplicated: Secondary | ICD-10-CM | POA: Diagnosis not present

## 2022-08-29 DIAGNOSIS — I4891 Unspecified atrial fibrillation: Secondary | ICD-10-CM | POA: Diagnosis not present

## 2022-08-29 DIAGNOSIS — I2510 Atherosclerotic heart disease of native coronary artery without angina pectoris: Secondary | ICD-10-CM | POA: Diagnosis not present

## 2022-08-29 NOTE — Patient Instructions (Addendum)
Medication Instructions:  Continue all current medications.  Labwork: none  Testing/Procedures: none  Follow-Up: 6 months   Any Other Special Instructions Will Be Listed Below (If Applicable). You have been referred to Pulmonology    If you need a refill on your cardiac medications before your next appointment, please call your pharmacy.  

## 2022-08-29 NOTE — Progress Notes (Signed)
Clinical Summary Amy Tran is a 67 y.o.female seen today for follow up of the following medical problems.    1.CAD -  history of DES to RCA 04/2019 in setting of NSTEMI - 04/2019 echo: LVEF 55-60%, no WMAs   -She is on plavix for recent lower extremity intervention,remains on xarelto for afib   -denies any chest pains, some SOB/DOE. Better with inhaler.       2.Aortic stenosis -04/2019 echo mean grad 16, AVA VTI 1.4 - - 09/2021 echo: mild to mod AS mean grad 15, AVA VTI 1.2 - repeat echo next year.    3.Afib - followed by EP, has been on dofetilide - no recent palpitations - no recent presyncope, or syncope   - no recent palpitations - compliant with meds. No bleeding on xarelto.  - from recent admission with sepsis EKGs did show rate controlled afib   - no palpitations - compliant with meds - no bleeding on xarelto   4.PAD - followed by vascular - 09/2020 balloon  left PT, mechanical thrombectomy prox left popliteal, balloon left pop, stent prox left pop     5. Fatigue - chronic generalized fatigue per pcp       6. Abnormal PFTs - followed by pulmonary Dr Craige Cotta. Started on inhalers recently after abnormal PFTs thought to be asthma       7. LE edema - 2021 echo LVEF 55-60, indet diastolic function. Prior DVT left leg - has been evaluated by vascular -likely related to obesity, perhaps venous insufficiency. Previous echo fairly mild findings     - compliant with diuretic - has not been compilant with home compression pump     09/2021 echo: LVEF 60-65%, no WMAs, indet diastolic fxn, normal RV fxn, mild MR - chronic stable LE edema - difficultly working her lymphedema pump     8. Hyperlipidemia - upcoming labs with pcp - labs follower by pcp  08/2022 TC 100 TG 107 HDL 34 LDL 45   9. HTN - compliant with meds  10.Hematuria - ER visit 06/24/22 - treated for UTI    Past Medical History:  Diagnosis Date   Aortic stenosis    Arthritis    Asthma     Cancer (HCC)    multiple skin cancers   Coronary artery disease    Diabetes mellitus without complication (HCC)    Diverticulosis    Dysrhythmia    Fluid retention    GERD (gastroesophageal reflux disease)    High cholesterol    Hyperlipidemia    Hypertension    Incomplete rotator cuff tear    Myocardial infarction (HCC)    Obesity    Osteoporosis    PAD (peripheral artery disease) (HCC)    Persistent atrial fibrillation (HCC)    PONV (postoperative nausea and vomiting)    Wears glasses    Wears partial dentures    bottom     Allergies  Allergen Reactions   Penicillins Hives and Rash    Has patient had a PCN reaction causing immediate rash, facial/tongue/throat swelling, SOB or lightheadedness with hypotension: Yes Has patient had a PCN reaction causing severe rash involving mucus membranes or skin necrosis: No Has patient had a PCN reaction that required hospitalization No Has patient had a PCN reaction occurring within the last 10 years: No If all of the above answers are "NO", then may proceed with Cephalosporin use.    Doxycycline Itching     Current Outpatient Medications  Medication  Sig Dispense Refill   albuterol (VENTOLIN HFA) 108 (90 Base) MCG/ACT inhaler Inhale 2 puffs into the lungs every 6 (six) hours as needed for wheezing or shortness of breath.     alendronate (FOSAMAX) 70 MG tablet Take 70 mg by mouth every Sunday.     atorvastatin (LIPITOR) 80 MG tablet Take 1 tablet (80 mg total) by mouth daily. 90 tablet 3   BD PEN NEEDLE NANO U/F 32G X 4 MM MISC USE FOR VICTOZA INJECTIONS     Cholecalciferol (VITAMIN D) 50 MCG (2000 UT) CAPS Take 2,000 Units by mouth daily.     clopidogrel (PLAVIX) 75 MG tablet Take 1 tablet (75 mg total) by mouth daily with breakfast. 90 tablet 3   cyanocobalamin (VITAMIN B12) 500 MCG tablet Take 500 mcg by mouth daily.     cyclobenzaprine (FLEXERIL) 10 MG tablet Take 1 tablet (10 mg total) by mouth 3 (three) times daily as  needed for muscle spasms. 30 tablet 3   dofetilide (TIKOSYN) 250 MCG capsule TAKE 1 CAPSULE BY MOUTH 2 TIMES DAILY. 180 capsule 1   fluticasone (FLONASE) 50 MCG/ACT nasal spray Place 2 sprays into both nostrils daily as needed for rhinitis (congestion/ dryness).     glipiZIDE (GLUCOTROL XL) 5 MG 24 hr tablet Take 5 mg by mouth every morning.     JARDIANCE 10 MG TABS tablet Take 10 mg by mouth daily.     Lancets (ONETOUCH DELICA PLUS LANCET33G) MISC 1 each daily.     losartan (COZAAR) 100 MG tablet Take 100 mg by mouth daily with breakfast.      nitroGLYCERIN (NITROSTAT) 0.4 MG SL tablet Place 1 tablet (0.4 mg total) under the tongue every 5 (five) minutes as needed for chest pain. 30 tablet 0   potassium chloride SA (KLOR-CON) 20 MEQ tablet Take 1 tablet (20 mEq total) by mouth 2 (two) times daily. (Patient taking differently: Take 20 mEq by mouth in the morning, at noon, and at bedtime.)     Skin Protectants, Misc. (EUCERIN) cream Apply 1 Application topically daily.     spironolactone (ALDACTONE) 25 MG tablet Take 25 mg by mouth daily with breakfast.      torsemide (DEMADEX) 20 MG tablet Take 60 mg by mouth daily after breakfast.      VICTOZA 18 MG/3ML SOPN Inject 1.8 mg into the skin at bedtime.      XARELTO 20 MG TABS tablet TAKE ONE TABLET (20MG  TOTAL) BY MOUTH DAILY AT 5PM WITH SUPPER 90 tablet 11   No current facility-administered medications for this visit.     Past Surgical History:  Procedure Laterality Date   APPENDECTOMY     CARDIOVERSION N/A 06/03/2019   Procedure: CARDIOVERSION;  Surgeon: Laqueta Linden, MD;  Location: [facility];  Service: Cardiovascular;  Laterality: N/A;   CARDIOVERSION N/A 07/13/2019   Procedure: CARDIOVERSION;  Surgeon: Sande Rives, MD;  Location: [facility] ENDOSCOPY;  Service: Cardiovascular;  Laterality: N/A;   CARPAL TUNNEL RELEASE Bilateral 2001   bilateral   CERVICAL POLYPECTOMY  04/08/2017   Procedure: POLYPECTOMY;  Surgeon: Tilda Burrow,  MD;  Location: [facility];  Service: Gynecology;;  Endometrial   CHOLECYSTECTOMY     COLONOSCOPY  2007   BJY:NWGNFAO internal hemorrhoids. Diminutive rectal polyp at 10 cm, cold biopsied/removed. The remainder of the rectal mucosa appeared normal Swallow left-sided diverticula. Diminutive polyp at the splenic flexure cold biopsied/removed (adenomatous)   COLONOSCOPY N/A 12/28/2013   Procedure: COLONOSCOPY;  Surgeon: Corbin Ade,  MD;  Location: [facility] SUITE;  Service: Endoscopy;  Laterality: N/A;  730 - moved to 8:30 - Ginger notified pt   CORONARY STENT INTERVENTION N/A 04/27/2019   Procedure: CORONARY STENT INTERVENTION;  Surgeon: Lennette Bihari, MD;  Location: [facility] INVASIVE CV LAB;  Service: Cardiovascular;  Laterality: N/A;   CORONARY ULTRASOUND/IVUS N/A 09/12/2017   Procedure: INTRAVASCULAR ULTRASOUND/IVUS;  Surgeon: Sherren Kerns, MD;  Location: [facility] INVASIVE CV LAB;  Service: Cardiovascular;  Laterality: N/A;   DILATION AND CURETTAGE, DIAGNOSTIC / THERAPEUTIC  03/07/2017   ERCP  2002   GASTRIC BYPASS  2004   HYSTEROSCOPY WITH D & C N/A 04/08/2017   Procedure: DILATATION AND CURETTAGE /HYSTEROSCOPY;  Surgeon: Tilda Burrow, MD;  Location: [facility];  Service: Gynecology;  Laterality: N/A;   LEFT HEART CATH AND CORONARY ANGIOGRAPHY N/A 04/27/2019   Procedure: LEFT HEART CATH AND CORONARY ANGIOGRAPHY;  Surgeon: Lennette Bihari, MD;  Location: [facility] INVASIVE CV LAB;  Service: Cardiovascular;  Laterality: N/A;   LOWER EXTREMITY ANGIOGRAPHY Left 10/02/2020   Procedure: LOWER EXTREMITY ANGIOGRAPHY;  Surgeon: Annice Needy, MD;  Location: [facility] INVASIVE CV LAB;  Service: Cardiovascular;  Laterality: Left;   LUMBAR LAMINECTOMY/DECOMPRESSION MICRODISCECTOMY Right 03/15/2022   Procedure: Microdiscectomy - right - Lumbar Four-Lumbar Five extraforaminal;  Surgeon: Julio Sicks, MD;  Location[facility] OR;  Service: Neurosurgery;  Laterality: Right;   NECK SURGERY  1998   fusion   PERCUTANEOUS VENOUS THROMBECTOMY,LYSIS  WITH INTRAVASCULAR ULTRASOUND (IVUS) Left 09/13/2017   Procedure: REMOVAL OF LYSIS CATHETER AND INTRAVASCULAR ULTRASOUND (IVUS) LEFT ILIAC VEIN;  Surgeon: Sherren Kerns, MD;  Location: [facility] OR;  Service: Vascular;  Laterality: Left;   PERIPHERAL VASCULAR INTERVENTION  09/15/2017   Procedure: PERIPHERAL VASCULAR INTERVENTION;  Surgeon: Maeola Harman, MD;  Location: [facility] INVASIVE CV LAB;  Service: Cardiovascular;;  VEINOUS/STENT   PERIPHERAL VASCULAR THROMBECTOMY Left 09/12/2017   Procedure: PERIPHERAL VASCULAR THROMBECTOMY;  Surgeon: Sherren Kerns, MD;  Location: [facility] INVASIVE CV LAB;  Service: Cardiovascular;  Laterality: Left;   PERIPHERAL VASCULAR THROMBECTOMY Left 09/15/2017   Procedure: PERIPHERAL VASCULAR THROMBECTOMY;  Surgeon: Maeola Harman, MD;  Location[facility] INVASIVE CV LAB;  Service: Cardiovascular;  Laterality: Left;  IVC TO LT FEM/POP VEIN   SHOULDER ARTHROSCOPY WITH SUBACROMIAL DECOMPRESSION Left 06/23/2013   Procedure: LEFT SHOULDER ARTHROSCOPY WITH DEBRIDEMENT ROTATOR CUFF AND LABRUM;  Surgeon: Nilda Simmer, MD;  Location: [facility];  Service: Orthopedics;  Laterality: Left;   SHOULDER SURGERY  1999   left     Allergies  Allergen Reactions   Penicillins Hives and Rash    Has patient had a PCN reaction causing immediate rash, facial/tongue/throat swelling, SOB or lightheadedness with hypotension: Yes Has patient had a PCN reaction causing severe rash involving mucus membranes or skin necrosis: No Has patient had a PCN reaction that required hospitalization No Has patient had a PCN reaction occurring within the last 10 years: No If all of the above answers are "NO", then may proceed with Cephalosporin use.    Doxycycline Itching      Family History  Problem Relation Age of Onset   Diabetes Father    Hypertension Father    Parkinson's disease Father    Cancer Father        not sure what kind   Hypertension Mother    Heart disease  Mother    Drug abuse Sister    Mesothelioma Brother    Cancer Daughter  non-hodgkins lmphoma   Hypertension Brother    Diabetes Brother    Colon cancer Neg Hx    Gastric cancer Neg Hx    Esophageal cancer Neg Hx      Social History Ms. Binning reports that she has been smoking cigarettes. She started smoking about 52 years ago. She has a 78.6 pack-year smoking history. She has never used smokeless tobacco. Ms. Cabanilla reports that she does not currently use alcohol.   Review of Systems CONSTITUTIONAL: No weight loss, fever, chills, weakness or fatigue.  HEENT: Eyes: No visual loss, blurred vision, double vision or yellow sclerae.No hearing loss, sneezing, congestion, runny nose or sore throat.  SKIN: No rash or itching.  CARDIOVASCULAR: per hpi RESPIRATORY: No shortness of breath, cough or sputum.  GASTROINTESTINAL: No anorexia, nausea, vomiting or diarrhea. No abdominal pain or blood.  GENITOURINARY: No burning on urination, no polyuria NEUROLOGICAL: No headache, dizziness, syncope, paralysis, ataxia, numbness or tingling in the extremities. No change in bowel or bladder control.  MUSCULOSKELETAL: No muscle, back pain, joint pain or stiffness.  LYMPHATICS: No enlarged nodes. No history of splenectomy.  PSYCHIATRIC: No history of depression or anxiety.  ENDOCRINOLOGIC: No reports of sweating, cold or heat intolerance. No polyuria or polydipsia.  Marland Kitchen   Physical Examination Today's Vitals   08/29/22 1537  BP: (!) 110/58  Pulse: (!) 47  SpO2: 98%  Weight: 268 lb (121.6 kg)  Height: 5\' 5"  (1.651 m)   Body mass index is 44.6 kg/m.  Gen: resting comfortably, no acute distress HEENT: no scleral icterus, pupils equal round and reactive, no palptable cervical adenopathy,  CV: regular, brady, no m/rg, no jvd Resp: Clear to auscultation bilaterally GI: abdomen is soft, non-tender, non-distended, normal bowel sounds, no hepatosplenomegaly MSK: extremities are warm,bilateral  nonpitting edema Skin: warm, no rash Neuro:  no focal deficits Psych: appropriate affect   Diagnostic Studies  01/2016 echo Study Conclusions   - Left ventricle: The cavity size was mildly dilated. Wall   thickness was normal. Systolic function was normal. The estimated   ejection fraction was in the range of 60% to 65%. The study is   not technically sufficient to allow evaluation of LV diastolic   function. - Pulmonary arteries: PA peak pressure: 32 mm Hg (S). - Pericardium, extracardiac: A trivial pericardial effusion was   identified.   04/2016 Lexiscan There was no ST segment deviation noted during stress. The study is normal. There are no perfusion defects consistent with prior infarct or current ischemia. Anterior fixed defect likely causes by breast attenuation. This is a low risk study. The left ventricular ejection fraction is normal (55-65%).     04/2019 cath Prox RCA lesion is 95% stenosed. Post intervention, there is a 0% residual stenosis. A stent was successfully placed.   Acute coronary syndrome secondary to high-grade thrombotic stenosis in the very proximal RCA in a dominant RCA vessel.   Normal left coronary circulation with a short left main, large LAD and left circumflex vessels.   Dominant RCA with 95% very proximal stenosis with thrombus burden with TIMI-3 flow.   Successful percutaneous coronary prevention with PTCA and ultimate stenting with a 3.0 x18 mm Resolute Onyx DES stent postdilated to 3.73 mm tapering to 3.68 mm with the percent stenosis being reduced to 0% and brisk TIMI-3 flow without evidence for dissection.   RECOMMENDATION: Consider initial triple drug therapy for 1 month with continuation of Plavix/Xarelto for at least 6 months in this patient on long-term anticoagulation  therapy.     08/2018 sleep study Mild obstructive sleep apnea is documented with this recording.  The severity does not require positive pressure treatment.      09/2021 echo IMPRESSIONS     1. Left ventricular ejection fraction, by estimation, is 60 to 65%. The  left ventricle has normal function. The left ventricle has no regional  wall motion abnormalities. Left ventricular diastolic parameters are  indeterminate. The average left  ventricular global longitudinal strain is -24.0 %. The global longitudinal  strain is normal.   2. Right ventricular systolic function is normal. The right ventricular  size is normal.   3. The mitral valve is abnormal. Mild mitral valve regurgitation. No  evidence of mitral stenosis.   4. The aortic valve is tricuspid. There is mild calcification of the  aortic valve. There is mild thickening of the aortic valve. Aortic valve  regurgitation is trivial. Mild to moderate aortic valve stenosis. Aortic  valve mean gradient measures 15.0  mmHg. Aortic valve peak gradient measures 26.4 mmHg. Aortic valve area, by  VTI measures 1.23 cm.   5. There is mild dilatation of the ascending aorta, measuring 36 mm.   6. The inferior vena cava is normal in size with greater than 50%  respiratory variability, suggesting right atrial pressure of 3 mmHg.    Assessment and Plan   1.CAD - on xarelto for afib and plavix for recent lower extremity intervention, not for cardiac indication - no beta blocker due to bradycardia - no symptoms, continue current meds     2. HTN - bp is at goal, continue current meds   3. LE edema - likely related to obesity and lympedema, fairly benign echo  - follow with vascular, has lymphedema pump   4. Afib - no symptoms, continue current meds. Has done well on dofetilide. EKG today sinus brady 47 that his chronic for her, she is not on any av nodal agents and asymptomatic. QTc is fine at 460.     5. Asthma - recent symptoms, only has prn albuterol. She will reestablish with pulmonary Dr Craige Cotta   Antoine Poche, M.D.

## 2022-09-19 NOTE — Progress Notes (Deleted)
Amy Tran, female    DOB: 09-Jul-1955    MRN: [id number]   Brief patient profile:  67  yo*** *** referred to pulmonary clinic in [place]  09/20/2022 by *** for ***      History of Present Illness  09/20/2022  Pulmonary/ 1st office eval/ Sherene Sires / [place] [facility]  No chief complaint on file.    Dyspnea:  *** Cough: *** Sleep: *** SABA use: *** 02: *** Lung cancer screen: ***   Outpatient Medications Prior to Visit  Medication Sig Dispense Refill   albuterol (VENTOLIN HFA) 108 (90 Base) MCG/ACT inhaler Inhale 2 puffs into the lungs every 6 (six) hours as needed for wheezing or shortness of breath.     alendronate (FOSAMAX) 70 MG tablet Take 70 mg by mouth every Sunday.     atorvastatin (LIPITOR) 80 MG tablet Take 1 tablet (80 mg total) by mouth daily. 90 tablet 3   BD PEN NEEDLE NANO U/F 32G X 4 MM MISC USE FOR VICTOZA INJECTIONS     Cholecalciferol (VITAMIN D) 50 MCG (2000 UT) CAPS Take 2,000 Units by mouth daily.     clopidogrel (PLAVIX) 75 MG tablet Take 1 tablet (75 mg total) by mouth daily with breakfast. 90 tablet 3   cyanocobalamin (VITAMIN B12) 500 MCG tablet Take 500 mcg by mouth daily.     cyclobenzaprine (FLEXERIL) 10 MG tablet Take 1 tablet (10 mg total) by mouth 3 (three) times daily as needed for muscle spasms. 30 tablet 3   dofetilide (TIKOSYN) 250 MCG capsule TAKE 1 CAPSULE BY MOUTH 2 TIMES DAILY. 180 capsule 1   fluticasone (FLONASE) 50 MCG/ACT nasal spray Place 2 sprays into both nostrils daily as needed for rhinitis (congestion/ dryness).     glipiZIDE (GLUCOTROL XL) 5 MG 24 hr tablet Take 5 mg by mouth every morning.     JARDIANCE 10 MG TABS tablet Take 10 mg by mouth daily.     Lancets (ONETOUCH DELICA PLUS LANCET33G) MISC 1 each daily.     losartan (COZAAR) 100 MG tablet Take 100 mg by mouth daily with breakfast.      MOUNJARO 2.5 MG/0.5ML Pen Inject 2.5 mg into the skin once a week.     nitroGLYCERIN (NITROSTAT) 0.4 MG SL tablet Place 1 tablet (0.4  mg total) under the tongue every 5 (five) minutes as needed for chest pain. 30 tablet 0   potassium chloride SA (KLOR-CON) 20 MEQ tablet Take 1 tablet (20 mEq total) by mouth 2 (two) times daily. (Patient taking differently: Take 20 mEq by mouth in the morning, at noon, and at bedtime.)     Skin Protectants, Misc. (EUCERIN) cream Apply 1 Application topically daily.     spironolactone (ALDACTONE) 25 MG tablet Take 25 mg by mouth daily with breakfast.      torsemide (DEMADEX) 20 MG tablet Take 60 mg by mouth daily after breakfast.      VICTOZA 18 MG/3ML SOPN Inject 1.8 mg into the skin at bedtime.      XARELTO 20 MG TABS tablet TAKE ONE TABLET (20MG  TOTAL) BY MOUTH DAILY AT 5PM WITH SUPPER 90 tablet 11   No facility-administered medications prior to visit.    Past Medical History:  Diagnosis Date   Aortic stenosis    Arthritis    Asthma    Cancer [facility])    multiple skin cancers   Coronary artery disease    Diabetes mellitus without complication (HCC)    Diverticulosis  Dysrhythmia    Fluid retention    GERD (gastroesophageal reflux disease)    High cholesterol    Hyperlipidemia    Hypertension    Incomplete rotator cuff tear    Myocardial infarction (HCC)    Obesity    Osteoporosis    PAD (peripheral artery disease) (HCC)    Persistent atrial fibrillation (HCC)    PONV (postoperative nausea and vomiting)    Wears glasses    Wears partial dentures    bottom      Objective:     There were no vitals taken for this visit.         Assessment   No problem-specific Assessment & Plan notes found for this encounter.     Sandrea Hughs, MD 09/19/2022

## 2022-09-20 ENCOUNTER — Institutional Professional Consult (permissible substitution): Payer: 59 | Admitting: Internal Medicine

## 2022-09-23 ENCOUNTER — Ambulatory Visit
Admission: RE | Admit: 2022-09-23 | Discharge: 2022-09-23 | Disposition: A | Discharge status: Home / Self Care | Payer: 59 | Source: Ambulatory Visit | Attending: Neurosurgery | Admitting: Neurosurgery

## 2022-09-23 DIAGNOSIS — M5416 Radiculopathy, lumbar region: Secondary | ICD-10-CM

## 2022-09-23 MED ORDER — GADOPICLENOL 0.5 MMOL/ML IV SOLN
10.0000 mL | Freq: Once | INTRAVENOUS | Status: AC | PRN
  Administered 2022-09-23: 10 mL via INTRAVENOUS

## 2022-09-24 ENCOUNTER — Other Ambulatory Visit: Payer: 59

## 2022-10-09 NOTE — Progress Notes (Unsigned)
Nilda Calamity, female    DOB: [birth year]    MRN: [id number]   Brief patient profile:  18 yowf  active smoker  referred to pulmonary clinic in [place]  10/10/2022 by Dr Wyline Mood for doe   PFT 04/19/16 >> FEV1 2.40 (94%), Ratio 0.77, TLC 5.34 (105%), DLCO 78%, RV 2.70 (134%), ERV 5% wt wt 289  FeNO 06/03/16 >> 11   History of Present Illness  10/10/2022  Pulmonary/ 1st office eval/ Sherene Sires / [place] [facility]  Chief Complaint  Patient presents with   Establish Care   Asthma    [name] 2018  Dyspnea:  MMRC2 = can't walk a nl pace on a flat grade s sob but does fine slow and flat  - problem is also legs hurt/ back pain and [employer] / [employer] parking  Cough: am congestion x sev min  Sleep: flat bed/ 2 pillows/s resp cc as long as stays off back due to pnds  SABA use: up to 2 -3 x per day helps to take it before ex  02: none  Lung cancer screen: referred   No obvious day to day or daytime pattern/variability or assoc excess/ purulent sputum or mucus plugs or hemoptysis or cp or chest tightness, subjective wheeze or overt sinus or hb symptoms.    Also denies any obvious fluctuation of symptoms with weather or environmental changes or other aggravating or alleviating factors except as outlined above   No unusual exposure hx or h/o childhood pna/ asthma or knowledge of premature birth.  Current Allergies, Complete Past Medical History, Past Surgical History, Family History, and Social History were reviewed in [employer] record.  ROS  The following are not active complaints unless bolded Hoarseness, sore throat, dysphagia, dental problems, itching, sneezing,  nasal congestion or discharge of excess mucus or purulent secretions, ear ache,   fever, chills, sweats, unintended wt loss or wt gain, classically pleuritic or exertional cp,  orthopnea pnd or arm/hand swelling  or leg swelling, presyncope, palpitations, abdominal pain, anorexia, nausea, vomiting, diarrhea  or  change in bowel habits or change in bladder habits, change in stools or change in urine, dysuria, hematuria,  rash, arthralgias, visual complaints, headache, numbness, weakness or ataxia or problems with walking or coordination,  change in mood or  memory.              Outpatient Medications Prior to Visit  Medication Sig Dispense Refill   albuterol (VENTOLIN HFA) 108 (90 Base) MCG/ACT inhaler Inhale 2 puffs into the lungs every 6 (six) hours as needed for wheezing or shortness of breath.     alendronate (FOSAMAX) 70 MG tablet Take 70 mg by mouth every Sunday.     atorvastatin (LIPITOR) 80 MG tablet Take 1 tablet (80 mg total) by mouth daily. 90 tablet 3   BD PEN NEEDLE NANO U/F 32G X 4 MM MISC USE FOR VICTOZA INJECTIONS     Cholecalciferol (VITAMIN D) 50 MCG (2000 UT) CAPS Take 2,000 Units by mouth daily.     clopidogrel (PLAVIX) 75 MG tablet Take 1 tablet (75 mg total) by mouth daily with breakfast. 90 tablet 3   cyanocobalamin (VITAMIN B12) 500 MCG tablet Take 500 mcg by mouth daily.     cyclobenzaprine (FLEXERIL) 10 MG tablet Take 1 tablet (10 mg total) by mouth 3 (three) times daily as needed for muscle spasms. 30 tablet 3   dofetilide (TIKOSYN) 250 MCG capsule TAKE 1 CAPSULE BY MOUTH 2 TIMES DAILY. 180  capsule 1   fluticasone (FLONASE) 50 MCG/ACT nasal spray Place 2 sprays into both nostrils daily as needed for rhinitis (congestion/ dryness).     gabapentin (NEURONTIN) 300 MG capsule Take 300 mg by mouth 3 (three) times daily.     glipiZIDE (GLUCOTROL XL) 5 MG 24 hr tablet Take 5 mg by mouth every morning.     JARDIANCE 10 MG TABS tablet Take 10 mg by mouth daily.     ketoconazole (NIZORAL) 2 % cream Apply 1 Application topically 2 (two) times daily.     Lancets (ONETOUCH DELICA PLUS LANCET33G) MISC 1 each daily.     losartan (COZAAR) 100 MG tablet Take 100 mg by mouth daily with breakfast.      MOUNJARO 5 MG/0.5ML Pen Inject 5 mg into the skin once a week.     nitroGLYCERIN  (NITROSTAT) 0.4 MG SL tablet Place 1 tablet (0.4 mg total) under the tongue every 5 (five) minutes as needed for chest pain. 30 tablet 0   potassium chloride SA (KLOR-CON) 20 MEQ tablet Take 1 tablet (20 mEq total) by mouth 2 (two) times daily. (Patient taking differently: Take 20 mEq by mouth in the morning, at noon, and at bedtime.)     Skin Protectants, Misc. (EUCERIN) cream Apply 1 Application topically daily.     spironolactone (ALDACTONE) 25 MG tablet Take 25 mg by mouth daily with breakfast.      torsemide (DEMADEX) 20 MG tablet Take 60 mg by mouth daily after breakfast.      VICTOZA 18 MG/3ML SOPN Inject 1.8 mg into the skin at bedtime.      XARELTO 20 MG TABS tablet TAKE ONE TABLET (20MG  TOTAL) BY MOUTH DAILY AT 5PM WITH SUPPER 90 tablet 11   MOUNJARO 2.5 MG/0.5ML Pen Inject 2.5 mg into the skin once a week.     No facility-administered medications prior to visit.    Past Medical History:  Diagnosis Date   Aortic stenosis    Arthritis    Asthma    Cancer (HCC)    multiple skin cancers   Coronary artery disease    Diabetes mellitus without complication (HCC)    Diverticulosis    Dysrhythmia    Fluid retention    GERD (gastroesophageal reflux disease)    High cholesterol    Hyperlipidemia    Hypertension    Incomplete rotator cuff tear    Myocardial infarction (HCC)    Obesity    Osteoporosis    PAD (peripheral artery disease) (HCC)    Persistent atrial fibrillation (HCC)    PONV (postoperative nausea and vomiting)    Wears glasses    Wears partial dentures    bottom      Objective:     BP 134/71   Pulse (!) 45   Ht 5\' 5"  (1.651 m)   Wt 265 lb (120.2 kg)   SpO2 95%   BMI 44.10 kg/m   SpO2: 95 % RA   Wt Readings from Last 3 Encounters:  10/10/22 265 lb (120.2 kg)  08/29/22 268 lb (121.6 kg)  08/26/22 268 lb 15.4 oz (122 kg)      Vital signs reviewed  10/10/2022  - Note at rest 02 sats  95% on RA   General appearance:        MO (by bmi)  pleasant  amb wf nad    HEENT : Oropharynx  clear  / edentulous    NECK :  without  apparent JVD/ palpable Nodes/TM  LUNGS: no acc muscle use,  Nl contour chest trace wheeze bilaterally without cough on insp or exp maneuvers - prior to am saba    CV:  RRR  no s3 or murmur or increase in P2, and no edema   ABD:  soft and nontender with nl inspiratory excursion in the supine position. No bruits or organomegaly appreciated   MS:  Nl gait/ ext warm without deformities Or obvious joint restrictions  calf tenderness, cyanosis or clubbing    SKIN: warm and dry without lesions    NEURO:  alert, approp, nl sensorium with  no motor or cerebellar deficits apparent.         Assessment   Allante was seen today for establish care and asthma.  Asthmatic bronchitis , chronic Overview: Active smoker/ symptoms since 2018 PFT 04/19/16 >> FEV1 2.40 (94%), Ratio 0.77, TLC 5.34 (105%), DLCO 78%, RV 2.70 (134%), ERV 5% wt wt 289  FeNO 06/03/16 >> 11 Allergy screen 10/10/2022 >  Eos 0. /  IgE   - 10/10/2022  After extensive coaching inhaler device,  effectiveness =    80% > try symb 80 2bid prn   Assessment & Plan: Active smoker/ symptoms since 2018 PFT 04/19/16 >> FEV1 2.40 (94%), Ratio 0.77, TLC 5.34 (105%), DLCO 78%, RV 2.70 (134%), ERV 5% wt wt 289  FeNO 06/03/16 >> 11 Allergy screen 10/10/2022 >  Eos 0. /  IgE   - 10/10/2022  After extensive coaching inhaler device,  effectiveness =    80% > try symb 80 2bid prn   Main problem is MO by pfts but appears to have a significant saba dependent form ie AB so rec trail of low dose symbicort in a hybrid mode Based on two studies from NEJM  378; 20 p 1865 (2018) and 380 : p2020-30 (2019) in pts with mild asthma it is reasonable to use low dose symbicort eg 80 2bid "prn" flare in this setting but I emphasized this was only shown with symbicort and takes advantage of the rapid onset of action but is not the same as "rescue therapy" but can be stopped once the acute  symptoms have resolved and the need for rescue has been minimized (< 2 x weekly)    >>> also added 1st gen H1 blockers per guidelines  for noct pnds   Orders: -     CBC with Differential/Platelet -     IgE -     DG Chest 2 View; Future -     Budesonide-Formoterol Fumarate; .Take 2 puffs first thing in am and then another 2 puffs about 12 hours later.  Dispense: 1 each; Refill: 12  Cigarette smoker Assessment & Plan: Counseled re importance of smoking cessation but did not meet time criteria for separate billing    Low-dose CT lung cancer screening is recommended for patients who are 47-77 years of age with a 20+ pack-year history of smoking and who are currently smoking or quit <=15 years ago. No coughing up blood  No unintentional weight loss of > 15 pounds in the last 6 months - pt is eligible for scanning yearly until age 28 > referred       Morbid obesity due to excess calories [facility]) Assessment & Plan: 04/20/22  ERV 5% @ wt 289 by pfts   Body mass index is 44.1 kg/m.  -  trending down slowly   Lab Results  Component Value Date   TSH 0.541 06/02/2020      Contributing  to doe and risk of GERD/dvt/ PE >>>   reviewed the need and the process to achieve and maintain neg calorie balance > defer f/u primary care including intermittently monitoring thyroid status      F/u in 3 m with all meds in hand using a trust but verify approach to confirm accurate Medication  Reconciliation The principal here is that until we are certain that the  patients are doing what we've asked, it makes no sense to ask them to do more.          Each maintenance medication was reviewed in detail including emphasizing most importantly the difference between maintenance and prns and under what circumstances the prns are to be triggered using an action plan format where appropriate.  Total time for H and P, chart review, counseling, reviewing hfa device(s) and generating customized AVS unique to this  office visit / same day charting  > 45 min new pt eval             Sandrea Hughs, MD Division of Pulmonary and Critical Care Medicine [facility]        Sandrea Hughs, MD 10/10/2022

## 2022-10-10 ENCOUNTER — Ambulatory Visit (HOSPITAL_COMMUNITY)
Admission: RE | Admit: 2022-10-10 | Discharge: 2022-10-10 | Disposition: A | Discharge status: Home / Self Care | Payer: 59 | Source: Ambulatory Visit | Attending: Internal Medicine | Admitting: Internal Medicine

## 2022-10-10 ENCOUNTER — Ambulatory Visit (INDEPENDENT_AMBULATORY_CARE_PROVIDER_SITE_OTHER): Payer: 59 | Admitting: Internal Medicine

## 2022-10-10 ENCOUNTER — Encounter: Payer: Self-pay | Admitting: Internal Medicine

## 2022-10-10 VITALS — BP 134/71 | HR 45 | Ht 65.0 in | Wt 265.0 lb

## 2022-10-10 DIAGNOSIS — F17210 Nicotine dependence, cigarettes, uncomplicated: Secondary | ICD-10-CM

## 2022-10-10 DIAGNOSIS — J4489 Other specified chronic obstructive pulmonary disease: Secondary | ICD-10-CM | POA: Insufficient documentation

## 2022-10-10 MED ORDER — BUDESONIDE-FORMOTEROL FUMARATE 80-4.5 MCG/ACT IN AERO
INHALATION_SPRAY | RESPIRATORY_TRACT | 12 refills | Status: DC
Start: 2022-10-10 — End: 2023-08-12

## 2022-10-10 NOTE — Patient Instructions (Addendum)
The key is to stop smoking completely before smoking completely stops you!   My office will be contacting you by phone for referral to lung cancer screening program ([phone])   - if you don't hear back from my office within one week please call us back or notify us thru MyChart and we'll address it right away.   Plan A = Automatic = Always=    symbicort 80 Take 2 puffs first thing in am and then optional  2 puffs about 12 hours later.   Work on inhaler technique:  relax and gently blow all the way out then take a nice smooth full deep breath back in, triggering the inhaler at same time you start breathing in.  Hold breath in for at least  5 seconds if you can. Blow out symbicort  thru nose. Rinse and gargle with water when done.  If mouth or throat bother you at all,  try brushing teeth/gums/tongue with arm and hammer toothpaste/ make a slurry and gargle and spit out.      Plan B = Backup (to supplement plan A, not to replace it) Only use your albuterol inhaler as a rescue medication to be used if you can't catch your breath by resting or doing a relaxed purse lip breathing pattern.  - The less you use it, the better it will work when you need it. - Ok to use the inhaler up to 2 puffs  every 4 hours if you must but call for appointment if use goes up over your usual need - Don't leave home without it !!  (think of it like starter fluid)   Also  Ok to try albuterol x 2 puffs 15 min before an activity (on alternating days)  that you know would usually make you short of breath and see if it makes any difference and if makes none then don't take albuterol after activity unless you can't catch your breath as this means it's the resting that helps, not the albuterol.  For drainage / throat tickle try take CHLORPHENIRAMINE  4 mg  ("Allergy Relief" 4mg   at [facility] should be easiest to find in the blue box usually on bottom shelf)  take one every 4 hours as needed - extremely effective and  inexpensive over the counter- may cause drowsiness so start with just a dose or two an hour before bedtime and see how you tolerate it before trying in daytime.   Please remember to go to the lab department   for your tests - we will call you with the results when they are available.  Please remember to go to the  x-ray department  @  [facility] for your tests - we will call you with the results when they are available         Please schedule a follow up visit in 3 months but call sooner if needed

## 2022-10-10 NOTE — Assessment & Plan Note (Addendum)
Active smoker/ symptoms since 2018 PFT 04/19/16 >> FEV1 2.40 (94%), Ratio 0.77, TLC 5.34 (105%), DLCO 78%, RV 2.70 (134%), ERV 5% wt wt 289  FeNO 06/03/16 >> 11 Allergy screen 10/10/2022 >  Eos 0. /  IgE   - 10/10/2022  After extensive coaching inhaler device,  effectiveness =    80% > try symb 80 2bid prn   Main problem is MO by pfts but appears to have a significant saba dependent form ie AB so rec trail of low dose symbicort in a hybrid mode Based on two studies from NEJM  378; 20 p 1865 (2018) and 380 : p2020-30 (2019) in pts with mild asthma it is reasonable to use low dose symbicort eg 80 2bid "prn" flare in this setting but I emphasized this was only shown with symbicort and takes advantage of the rapid onset of action but is not the same as "rescue therapy" but can be stopped once the acute symptoms have resolved and the need for rescue has been minimized (< 2 x weekly)    >>> also added 1st gen H1 blockers per guidelines  for noct pnds

## 2022-10-10 NOTE — Assessment & Plan Note (Signed)
Counseled re importance of smoking cessation but did not meet time criteria for separate billing    Low-dose CT lung cancer screening is recommended for patients who are 29-68 years of age with a 20+ pack-year history of smoking and who are currently smoking or quit <=15 years ago. No coughing up blood  No unintentional weight loss of > 15 pounds in the last 6 months - pt is eligible for scanning yearly until age 28 > referred

## 2022-10-10 NOTE — Assessment & Plan Note (Signed)
04/20/22  ERV 5% @ wt 289 by pfts   Body mass index is 44.1 kg/m.  -  trending down slowly   Lab Results  Component Value Date   TSH 0.541 06/02/2020      Contributing to doe and risk of GERD/dvt/ PE >>>   reviewed the need and the process to achieve and maintain neg calorie balance > defer f/u primary care including intermittently monitoring thyroid status      F/u in 3 m with all meds in hand using a trust but verify approach to confirm accurate Medication  Reconciliation The principal here is that until we are certain that the  patients are doing what we've asked, it makes no sense to ask them to do more.          Each maintenance medication was reviewed in detail including emphasizing most importantly the difference between maintenance and prns and under what circumstances the prns are to be triggered using an action plan format where appropriate.  Total time for H and P, chart review, counseling, reviewing hfa device(s) and generating customized AVS unique to this office visit / same day charting  > 45 min new pt eval

## 2022-10-16 DIAGNOSIS — M5416 Radiculopathy, lumbar region: Secondary | ICD-10-CM | POA: Diagnosis not present

## 2022-10-16 LAB — CBC WITH DIFFERENTIAL/PLATELET
Basophils Absolute: 0 10*3/uL (ref 0.0–0.2)
Basos: 0 %
EOS (ABSOLUTE): 0.2 10*3/uL (ref 0.0–0.4)
Eos: 3 %
Hematocrit: 41.6 % (ref 34.0–46.6)
Hemoglobin: 13.7 g/dL (ref 11.1–15.9)
Immature Grans (Abs): 0 10*3/uL (ref 0.0–0.1)
Immature Granulocytes: 0 %
Lymphocytes Absolute: 2.4 10*3/uL (ref 0.7–3.1)
Lymphs: 29 %
MCH: 30.2 pg (ref 26.6–33.0)
MCHC: 32.9 g/dL (ref 31.5–35.7)
MCV: 92 fL (ref 79–97)
Monocytes Absolute: 0.8 10*3/uL (ref 0.1–0.9)
Monocytes: 10 %
Neutrophils Absolute: 4.8 10*3/uL (ref 1.4–7.0)
Neutrophils: 58 %
Platelets: 266 10*3/uL (ref 150–450)
RBC: 4.53 x10E6/uL (ref 3.77–5.28)
RDW: 12.8 % (ref 11.7–15.4)
WBC: 8.3 10*3/uL (ref 3.4–10.8)

## 2022-10-16 LAB — IGE: IgE (Immunoglobulin E), Serum: 167 [IU]/mL (ref 6–495)

## 2022-10-18 ENCOUNTER — Encounter: Payer: Self-pay | Admitting: Hematology

## 2022-10-21 DIAGNOSIS — H353131 Nonexudative age-related macular degeneration, bilateral, early dry stage: Secondary | ICD-10-CM | POA: Diagnosis not present

## 2022-10-22 ENCOUNTER — Other Ambulatory Visit (INDEPENDENT_AMBULATORY_CARE_PROVIDER_SITE_OTHER): Payer: Self-pay | Admitting: Nurse Practitioner

## 2022-10-22 DIAGNOSIS — I739 Peripheral vascular disease, unspecified: Secondary | ICD-10-CM

## 2022-10-25 ENCOUNTER — Ambulatory Visit (INDEPENDENT_AMBULATORY_CARE_PROVIDER_SITE_OTHER): Payer: 59

## 2022-10-25 ENCOUNTER — Ambulatory Visit (INDEPENDENT_AMBULATORY_CARE_PROVIDER_SITE_OTHER): Payer: 59 | Admitting: Vascular Surgery

## 2022-10-25 ENCOUNTER — Encounter (INDEPENDENT_AMBULATORY_CARE_PROVIDER_SITE_OTHER): Payer: Self-pay

## 2022-10-25 DIAGNOSIS — I739 Peripheral vascular disease, unspecified: Secondary | ICD-10-CM | POA: Diagnosis not present

## 2022-10-25 DIAGNOSIS — Z9889 Other specified postprocedural states: Secondary | ICD-10-CM | POA: Diagnosis not present

## 2022-10-28 DIAGNOSIS — M5416 Radiculopathy, lumbar region: Secondary | ICD-10-CM | POA: Diagnosis not present

## 2022-11-04 LAB — VAS US ABI WITH/WO TBI
Left ABI: 1
Right ABI: 1.06

## 2022-11-05 ENCOUNTER — Telehealth: Payer: Self-pay | Admitting: *Deleted

## 2022-11-05 NOTE — Telephone Encounter (Signed)
I left a message for the patient to call our office to schedule a tele visit for pre-op

## 2022-11-05 NOTE — Telephone Encounter (Signed)
Patient with diagnosis of afib on Xarelto for anticoagulation.    Procedure: lumbar injection Date of procedure: TBD  CHA2DS2-VASc Score = 5  This indicates a 7.2% annual risk of stroke. The patient's score is based upon: CHF History: 0 HTN History: 1 Diabetes History: 1 Stroke History: 0 Vascular Disease History: 1 Age Score: 1 Gender Score: 1   Also with L iliac vein thrombosis in 2019  CrCl 38mL/min using adjusted body weight Platelet count 266K  Per office protocol, patient can hold Xarelto for 3 days prior to procedure.    **This guidance is not considered finalized until pre-operative APP has relayed final recommendations.**

## 2022-11-05 NOTE — Telephone Encounter (Signed)
Name: Amy Tran  DOB: Nov 10, 1955  MRN: [id number]  Primary Cardiologist: Dina Rich, MD   Preoperative team, please contact this patient and set up a phone call appointment for further preoperative risk assessment. Please obtain consent and complete medication review. Thank you for your help.  Okay to use provider slot on Friday, 9/27.  I confirm that guidance regarding antiplatelet and oral anticoagulation therapy has been completed and, if necessary, noted below.  Per Pharm D, patient may hold Xarelto for 3 days prior to procedure.     Carlos Levering, NP 11/05/2022, 3:35 PM [facility]

## 2022-11-05 NOTE — Telephone Encounter (Signed)
Pre-operative Risk Assessment    Patient Name: Amy Tran  DOB: 07/11/1955 MRN: [id number]      Request for Surgical Clearance    Procedure:   lumbar injection  Date of Surgery:  Clearance TBD                                 Surgeon:  Dr. Aileen Fass  Surgeon's Group or Practice Name:  [facility] Phone number:  [phone] Fax number:  [phone]   Type of Clearance Requested:   - Medical  - Pharmacy:  Hold Rivaroxaban (Xarelto) x 3 days prior   Type of Anesthesia:  None    Additional requests/questions:   STAT request   Elvin So   11/05/2022, 10:00 AM

## 2022-11-11 NOTE — Telephone Encounter (Signed)
Tried to call the pt to set up tele pre op appt though recording came on stating call cannot go through. I tried again and then there was no ring tone.

## 2022-11-13 NOTE — Telephone Encounter (Signed)
3rd and final attempt at scheduling a tele preop appt. I will remove from pool and notifiy requesting office.

## 2022-12-04 DIAGNOSIS — M5416 Radiculopathy, lumbar region: Secondary | ICD-10-CM | POA: Diagnosis not present

## 2022-12-09 DIAGNOSIS — M961 Postlaminectomy syndrome, not elsewhere classified: Secondary | ICD-10-CM | POA: Diagnosis not present

## 2022-12-09 DIAGNOSIS — M5416 Radiculopathy, lumbar region: Secondary | ICD-10-CM | POA: Diagnosis not present

## 2022-12-12 NOTE — Telephone Encounter (Signed)
Name: Amy Tran  DOB: 1955/04/08  MRN: [id number]  Primary Cardiologist: Dina Rich, MD   Preoperative team, please contact this patient and set up a phone call appointment for further preoperative risk assessment. Please obtain consent and complete medication review. Thank you for your help.  It appears there has been several attempts to contact the patient previously to set up tele-visit.   I confirm that guidance regarding antiplatelet and oral anticoagulation therapy has been completed and, if necessary, noted below.  Per Pharm D, patient may hold Xarelto for 3 days prior to procedure.    I also confirmed the patient resides in the state of [place]. As per [facility] Medical Board telemedicine laws, the patient must reside in the state in which the provider is licensed.   Carlos Levering, NP 12/12/2022, 12:42 PM [facility]

## 2022-12-12 NOTE — Telephone Encounter (Signed)
Dr. Lorrine Kin - [facility] has sent another surgical clearance for this procedure.

## 2022-12-12 NOTE — Telephone Encounter (Signed)
1st attempt to reach pt regarding surgical clearance and the need for an preop tele appointment.  Left pt a detailed message to call back and get that scheduled.

## 2022-12-16 ENCOUNTER — Telehealth: Payer: Self-pay

## 2022-12-16 NOTE — Telephone Encounter (Signed)
  Patient Consent for Virtual Visit         Amy Tran has provided verbal consent on 12/16/2022 for a virtual visit (video or telephone).   CONSENT FOR VIRTUAL VISIT FOR:  Amy Tran  By participating in this virtual visit I agree to the following:  I hereby voluntarily request, consent and authorize [facility] and its employed or contracted physicians, physician assistants, nurse practitioners or other licensed health care professionals (the Practitioner), to provide me with telemedicine health care services (the "Services") as deemed necessary by the treating Practitioner. I acknowledge and consent to receive the Services by the Practitioner via telemedicine. I understand that the telemedicine visit will involve communicating with the Practitioner through live audiovisual communication technology and the disclosure of certain medical information by electronic transmission. I acknowledge that I have been given the opportunity to request an in-person assessment or other available alternative prior to the telemedicine visit and am voluntarily participating in the telemedicine visit.  I understand that I have the right to withhold or withdraw my consent to the use of telemedicine in the course of my care at any time, without affecting my right to future care or treatment, and that the Practitioner or I may terminate the telemedicine visit at any time. I understand that I have the right to inspect all information obtained and/or recorded in the course of the telemedicine visit and may receive copies of available information for a reasonable fee.  I understand that some of the potential risks of receiving the Services via telemedicine include:  Delay or interruption in medical evaluation due to technological equipment failure or disruption; Information transmitted may not be sufficient (e.g. poor resolution of images) to allow for appropriate medical decision making by the  Practitioner; and/or  In rare instances, security protocols could fail, causing a breach of personal health information.  Furthermore, I acknowledge that it is my responsibility to provide information about my medical history, conditions and care that is complete and accurate to the best of my ability. I acknowledge that Practitioner's advice, recommendations, and/or decision may be based on factors not within their control, such as incomplete or inaccurate data provided by me or distortions of diagnostic images or specimens that may result from electronic transmissions. I understand that the practice of medicine is not an exact science and that Practitioner makes no warranties or guarantees regarding treatment outcomes. I acknowledge that a copy of this consent can be made available to me via my patient portal [facility] MyChart), or I can request a printed copy by calling the office of [facility].    I understand that my insurance will be billed for this visit.   I have read or had this consent read to me. I understand the contents of this consent, which adequately explains the benefits and risks of the Services being provided via telemedicine.  I have been provided ample opportunity to ask questions regarding this consent and the Services and have had my questions answered to my satisfaction. I give my informed consent for the services to be provided through the use of telemedicine in my medical care

## 2022-12-16 NOTE — Telephone Encounter (Signed)
Patient states that she is scheduled to see Dr. Lorrine Kin on 01/01/23. Pt states that she would like to have her tele visit after appointment. Pt scheduled for tele visit on 01/02/23. Med rec and consent done

## 2022-12-16 NOTE — Telephone Encounter (Signed)
Patient returned call to schedule tele visit.

## 2022-12-25 ENCOUNTER — Other Ambulatory Visit: Payer: Self-pay | Admitting: Cardiology

## 2022-12-26 ENCOUNTER — Encounter: Payer: Self-pay | Admitting: Hematology

## 2023-01-01 ENCOUNTER — Telehealth (INDEPENDENT_AMBULATORY_CARE_PROVIDER_SITE_OTHER): Payer: Self-pay

## 2023-01-01 DIAGNOSIS — M5416 Radiculopathy, lumbar region: Secondary | ICD-10-CM | POA: Diagnosis not present

## 2023-01-01 DIAGNOSIS — M961 Postlaminectomy syndrome, not elsewhere classified: Secondary | ICD-10-CM | POA: Diagnosis not present

## 2023-01-01 NOTE — Telephone Encounter (Signed)
Bring her in with ABIs to see me or dew

## 2023-01-01 NOTE — Telephone Encounter (Signed)
Patient left a message stating that she has sore and feels heat from the left leg. Patient notice these symptoms last Thursday. Please Advise

## 2023-01-02 ENCOUNTER — Ambulatory Visit: Payer: 59 | Attending: Internal Medicine | Admitting: Cardiology

## 2023-01-02 DIAGNOSIS — Z01810 Encounter for preprocedural cardiovascular examination: Secondary | ICD-10-CM | POA: Diagnosis not present

## 2023-01-02 DIAGNOSIS — Z23 Encounter for immunization: Secondary | ICD-10-CM | POA: Diagnosis not present

## 2023-01-02 DIAGNOSIS — S81802A Unspecified open wound, left lower leg, initial encounter: Secondary | ICD-10-CM | POA: Diagnosis not present

## 2023-01-02 NOTE — Progress Notes (Signed)
Virtual Visit via Telephone Note   Because of Amy Tran's co-morbid illnesses, she is at least at moderate risk for complications without adequate follow up.  This format is felt to be most appropriate for this patient at this time.  The patient did not have access to video technology/had technical difficulties with video requiring transitioning to audio format only (telephone).  All issues noted in this document were discussed and addressed.  No physical exam could be performed with this format.  Please refer to the patient's chart for her consent to telehealth for [facility].  Evaluation Performed:  Preoperative cardiovascular risk assessment _____________   Date:  01/02/2023   Patient ID:  Amy Tran, DOB 1955/10/07, MRN [id number] Patient Location:  Home Provider location:   Office  Primary Care Provider:  Carylon Perches, MD Primary Cardiologist:  Dina Rich, MD  Chief Complaint / Patient Profile   67 y.o. y/o female with a h/o CAD with NSTEMI s/p DES to RCA in 04/2019, persistent atrial fibrillation on Xarelto, aortic stenosis, PAD s/p prior intervention of left lower extremity, hypertension, hyperlipidemia, type 2 diabetes mellitus who is pending lumbar injection with [facility] and presents today for telephonic preoperative cardiovascular risk assessment.  History of Present Illness    Amy Tran is a 67 y.o. female who presents via audio/video conferencing for a telehealth visit today.  Pt was last seen in cardiology clinic on 08/29/22 by Dr. Wyline Mood. At that time Amy Tran was doing well.  The patient is now pending procedure as outlined above. Since her last visit, she has remained stable from a cardiac perspective. She denies chest pain, palpitations, dyspnea, pnd, orthopnea, n, v, dizziness, syncope, edema, weight gain, or early satiety.  Past Medical History    Past Medical History:  Diagnosis Date   Aortic  stenosis    Arthritis    Asthma    Cancer (HCC)    multiple skin cancers   Coronary artery disease    Diabetes mellitus without complication (HCC)    Diverticulosis    Dysrhythmia    Fluid retention    GERD (gastroesophageal reflux disease)    High cholesterol    Hyperlipidemia    Hypertension    Incomplete rotator cuff tear    Myocardial infarction (HCC)    Obesity    Osteoporosis    PAD (peripheral artery disease) (HCC)    Persistent atrial fibrillation (HCC)    PONV (postoperative nausea and vomiting)    Wears glasses    Wears partial dentures    bottom   Past Surgical History:  Procedure Laterality Date   APPENDECTOMY     CARDIOVERSION N/A 06/03/2019   Procedure: CARDIOVERSION;  Surgeon: Laqueta Linden, MD;  Location: [facility] ORS;  Service: Cardiovascular;  Laterality: N/A;   CARDIOVERSION N/A 07/13/2019   Procedure: CARDIOVERSION;  Surgeon: Sande Rives, MD;  Location: [facility] ENDOSCOPY;  Service: Cardiovascular;  Laterality: N/A;   CARPAL TUNNEL RELEASE Bilateral 2001   bilateral   CERVICAL POLYPECTOMY  04/08/2017   Procedure: POLYPECTOMY;  Surgeon: Tilda Burrow, MD;  Location: [facility] ORS;  Service: Gynecology;;  Endometrial   CHOLECYSTECTOMY     COLONOSCOPY  2007   [id number] internal hemorrhoids. Diminutive rectal polyp at 10 cm, cold biopsied/removed. The remainder of the rectal mucosa appeared normal Swallow left-sided diverticula. Diminutive polyp at the splenic flexure cold biopsied/removed (adenomatous)   COLONOSCOPY N/A 12/28/2013   Procedure: COLONOSCOPY;  Surgeon: Corbin Ade,  MD;  Location: [facility] ENDO SUITE;  Service: Endoscopy;  Laterality: N/A;  730 - moved to 8:30 - Ginger notified pt   CORONARY STENT INTERVENTION N/A 04/27/2019   Procedure: CORONARY STENT INTERVENTION;  Surgeon: Lennette Bihari, MD;  Location: [facility] INVASIVE CV LAB;  Service: Cardiovascular;  Laterality: N/A;   CORONARY ULTRASOUND/IVUS N/A 09/12/2017   Procedure: INTRAVASCULAR  ULTRASOUND/IVUS;  Surgeon: Sherren Kerns, MD;  Location: [facility] INVASIVE CV LAB;  Service: Cardiovascular;  Laterality: N/A;   DILATION AND CURETTAGE, DIAGNOSTIC / THERAPEUTIC  03/07/2017   ERCP  2002   GASTRIC BYPASS  2004   HYSTEROSCOPY WITH D & C N/A 04/08/2017   Procedure: DILATATION AND CURETTAGE /HYSTEROSCOPY;  Surgeon: Tilda Burrow, MD;  Location: [facility]S;  Service: Gynecology;  Laterality: N/A;   LEFT HEART CATH AND CORONARY ANGIOGRAPHY N/A 04/27/2019   Procedure: LEFT HEART CATH AND CORONARY ANGIOGRAPHY;  Surgeon: Lennette Bihari, MD;  Location: [facility] INVASIVE CV LAB;  Service: Cardiovascular;  Laterality: N/A;   LOWER EXTREMITY ANGIOGRAPHY Left 10/02/2020   Procedure: LOWER EXTREMITY ANGIOGRAPHY;  Surgeon: Annice Needy, MD;  Location: [facility] INVASIVE CV LAB;  Service: Cardiovascular;  Laterality: Left;   LUMBAR LAMINECTOMY/DECOMPRESSION MICRODISCECTOMY Right 03/15/2022   Procedure: Microdiscectomy - right - Lumbar Four-Lumbar Five extraforaminal;  Surgeon: Julio Sicks, MD;  Location: [facility] OR;  Service: Neurosurgery;  Laterality: Right;   NECK SURGERY  1998   fusion   PERCUTANEOUS VENOUS THROMBECTOMY,LYSIS WITH INTRAVASCULAR ULTRASOUND (IVUS) Left 09/13/2017   Procedure: REMOVAL OF LYSIS CATHETER AND INTRAVASCULAR ULTRASOUND (IVUS) LEFT ILIAC VEIN;  Surgeon: Sherren Kerns, MD;  Location: [facility] OR;  Service: Vascular;  Laterality: Left;   PERIPHERAL VASCULAR INTERVENTION  09/15/2017   Procedure: PERIPHERAL VASCULAR INTERVENTION;  Surgeon: Maeola Harman, MD;  Location: [facility] INVASIVE CV LAB;  Service: Cardiovascular;;  VEINOUS/STENT   PERIPHERAL VASCULAR THROMBECTOMY Left 09/12/2017   Procedure: PERIPHERAL VASCULAR THROMBECTOMY;  Surgeon: Sherren Kerns, MD;  Location: [facility] INVASIVE CV LAB;  Service: Cardiovascular;  Laterality: Left;   PERIPHERAL VASCULAR THROMBECTOMY Left 09/15/2017   Procedure: PERIPHERAL VASCULAR THROMBECTOMY;  Surgeon: Maeola Harman, MD;  Location: [facility] INVASIVE  CV LAB;  Service: Cardiovascular;  Laterality: Left;  IVC TO LT FEM/POP VEIN   SHOULDER ARTHROSCOPY WITH SUBACROMIAL DECOMPRESSION Left 06/23/2013   Procedure: LEFT SHOULDER ARTHROSCOPY WITH DEBRIDEMENT ROTATOR CUFF AND LABRUM;  Surgeon: Amy Simmer, MD;  Location: [facility];  Service: Orthopedics;  Laterality: Left;   SHOULDER SURGERY  1999   left    Allergies  Allergies  Allergen Reactions   Penicillins Hives and Rash    Has patient had a PCN reaction causing immediate rash, facial/tongue/throat swelling, SOB or lightheadedness with hypotension: Yes Has patient had a PCN reaction causing severe rash involving mucus membranes or skin necrosis: No Has patient had a PCN reaction that required hospitalization No Has patient had a PCN reaction occurring within the last 10 years: No If all of the above answers are "NO", then may proceed with Cephalosporin use.    Doxycycline Itching    Home Medications    Prior to Admission medications   Medication Sig Start Date End Date Taking? Authorizing Provider  albuterol (VENTOLIN HFA) 108 (90 Base) MCG/ACT inhaler Inhale 2 puffs into the lungs every 6 (six) hours as needed for wheezing or shortness of breath.    [provider]  alendronate (FOSAMAX) 70 MG tablet Take 70 mg by mouth every Sunday. 09/28/21   [provider]  atorvastatin (LIPITOR) 80 MG tablet TAKE ONE TABLET (80MG  TOTAL) BY MOUTH DAILY AT 5PM 12/25/22   Antoine Poche, MD  BD PEN NEEDLE NANO U/F 32G X 4 MM MISC USE FOR VICTOZA INJECTIONS 08/03/18   [provider]  budesonide-formoterol (SYMBICORT) 80-4.5 MCG/ACT inhaler .Take 2 puffs first thing in am and then another 2 puffs about 12 hours later. 10/10/22   Nyoka Cowden, MD  Cholecalciferol (VITAMIN D) 50 MCG (2000 UT) CAPS Take 2,000 Units by mouth daily.    [provider]  clopidogrel (PLAVIX) 75 MG tablet TAKE ONE TABLET (75 MG TOTAL) BY MOUTH DAILY AT 9AM with  breakfast 12/25/22   Antoine Poche, MD  cyanocobalamin (VITAMIN B12) 500 MCG tablet Take 500 mcg by mouth daily.    [provider]  cyclobenzaprine (FLEXERIL) 10 MG tablet Take 1 tablet (10 mg total) by mouth 3 (three) times daily as needed for muscle spasms. 03/16/22   Barnett Abu, MD  dofetilide (TIKOSYN) 250 MCG capsule TAKE ONE CAPSULE ( TOTAL) BY MOUTH TWICE DAILY @ 9AM-5PM 12/25/22   Antoine Poche, MD  fluticasone (FLONASE) 50 MCG/ACT nasal spray Place 2 sprays into both nostrils daily as needed for rhinitis (congestion/ dryness). 06/29/18   [provider]  gabapentin (NEURONTIN) 300 MG capsule Take 300 mg by mouth 3 (three) times daily. 06/05/22   [provider]  glipiZIDE (GLUCOTROL XL) 5 MG 24 hr tablet Take 5 mg by mouth every morning. 02/12/22   [provider]  JARDIANCE 10 MG TABS tablet Take 10 mg by mouth daily. 02/11/21   [provider]  ketoconazole (NIZORAL) 2 % cream Apply 1 Application topically 2 (two) times daily. 08/22/22   [provider]  Lancets [facility] DELICA PLUS [place]) MISC 1 each daily. 09/03/21   [provider]  losartan (COZAAR) 100 MG tablet Take 100 mg by mouth daily with breakfast.     [provider]  MOUNJARO 5 MG/0.5ML Pen Inject 5 mg into the skin once a week. 10/04/22   [provider]  nitroGLYCERIN (NITROSTAT) 0.4 MG SL tablet Place 1 tablet (0.4 mg total) under the tongue every 5 (five) minutes as needed for chest pain. 04/28/19   Albertine Grates, MD  potassium chloride SA (KLOR-CON) 20 MEQ tablet Take 1 tablet (20 mEq total) by mouth 2 (two) times daily. Patient taking differently: Take 20 mEq by mouth in the morning, at noon, and at bedtime. 09/23/19   Graciella Freer, PA-C  Skin Protectants, Misc. (EUCERIN) cream Apply 1 Application topically daily.    [provider]  spironolactone (ALDACTONE) 25 MG tablet Take 25 mg by mouth daily with breakfast.      [provider]  torsemide (DEMADEX) 20 MG tablet Take 60 mg by mouth daily after breakfast.     [provider]  VICTOZA 18 MG/3ML SOPN Inject 1.8 mg into the skin at bedtime.  08/04/18   [provider]  XARELTO 20 MG TABS tablet TAKE ONE TABLET (20MG  TOTAL) BY MOUTH DAILY AT 5PM WITH SUPPER 07/05/22   Antoine Poche, MD    Physical Exam    Vital Signs:  Amy Tran does not have vital signs available for review today.  Given telephonic nature of communication, physical exam is limited. AAOx3. NAD. Normal affect.  Speech and respirations are unlabored.  Accessory Clinical Findings    None Assessment & Plan    1.  Preoperative Cardiovascular Risk  Assessment: Lumbar Injection with Dr. Aileen Fass  Amy Tran's perioperative risk of a major cardiac event is 0.9% according to the Revised Cardiac Risk Index (RCRI).  Therefore, she is at low risk for perioperative complications.   Her functional capacity is good at 6.7 METs according to the [facility] Activity Status Index (DASI). Recommendations: According to ACC/AHA guidelines, no further cardiovascular testing needed.  The patient may proceed to surgery at acceptable risk.   Antiplatelet and/or Anticoagulation Recommendations: Per Pharm D, patient may hold Xarelto for 3 days prior to lumbar injection.  Please resume as soon as possible when it is safe to do so from a bleeding standpoint.  The patient was advised that if she develops new symptoms prior to surgery to contact our office to arrange for a follow-up visit, and she verbalized understanding.  A copy of this note will be routed to requesting surgeon.  Time:   Today, I have spent 10 minutes with the patient with telehealth technology discussing medical history, symptoms, and management plan.    Rip Harbour, NP  01/02/2023, 2:51 PM

## 2023-01-03 NOTE — Telephone Encounter (Signed)
LVM for pt to return call for appt

## 2023-01-06 ENCOUNTER — Telehealth: Payer: Self-pay | Admitting: Cardiology

## 2023-01-06 NOTE — Telephone Encounter (Signed)
   Pre-operative Risk Assessment    Patient Name: Amy Tran  DOB: 08/20/1955 MRN: [id number]      Request for Surgical Clearance    Procedure: Spinal Cord Stimulator Trail   Date of Surgery:  Clearance TBD                                 Surgeon:  Aileen Fass, MD  Surgeon's Group or Practice Name:  [facility] Phone number:  [phone] Fax number:  [phone]   Type of Clearance Requested:   Discontinue Xarelto for 3 days prior . The patient can resums blood thinner 5-7 days after.   Type of Anesthesia:  Not Indicated   Additional requests/questions:    Sallyanne Havers   01/06/2023, 3:02 PM

## 2023-01-07 NOTE — Telephone Encounter (Signed)
Patient with diagnosis of afib on Xarelto for anticoagulation.    Procedure: Spinal Cord Stimulator Trail  Date of procedure: TBD   CHA2DS2-VASc Score = 5   This indicates a 7.2% annual risk of stroke. The patient's score is based upon: CHF History: 0 HTN History: 1 Diabetes History: 1 Stroke History: 0 Vascular Disease History: 1 Age Score: 1 Gender Score: 1      CrCl 90 ml/min Platelet count 237  Due to the extent of the hold 3 days before and 5-7 days after, will have to defer to MD.  **This guidance is not considered finalized until pre-operative APP has relayed final recommendations.**

## 2023-01-08 ENCOUNTER — Telehealth: Payer: Self-pay | Admitting: Cardiology

## 2023-01-08 NOTE — Telephone Encounter (Signed)
error 

## 2023-01-08 NOTE — Telephone Encounter (Signed)
Holding xarelto for that duration would be ok  Dominga Ferry MD

## 2023-01-08 NOTE — Telephone Encounter (Signed)
   Name: Amy Tran  DOB: 1955-10-16  MRN: [id number]  Primary Cardiologist: Dina Rich, MD   Preoperative team, please contact this patient and set up a phone call appointment for further preoperative risk assessment. Please obtain consent and complete medication review. Thank you for your help.  I confirm that guidance regarding antiplatelet and oral anticoagulation therapy has been completed and, if necessary, noted below.  Patient may hold Xarelto for 3 days prior to and 5 to 7 days after spinal cord stimulator trial.  I also confirmed the patient resides in the state of [place]. As per [facility] Medical Board telemedicine laws, the patient must reside in the state in which the provider is licensed.   Ronney Asters, NP 01/08/2023, 12:52 PM [facility]

## 2023-01-08 NOTE — Telephone Encounter (Signed)
Left message for the patient to contact the office to schedule telehealth appt.

## 2023-01-13 ENCOUNTER — Telehealth: Payer: Self-pay | Admitting: Cardiology

## 2023-01-13 ENCOUNTER — Ambulatory Visit: Payer: 59 | Admitting: Internal Medicine

## 2023-01-13 NOTE — Telephone Encounter (Signed)
See previous clearance encounter. Patient is returning call.

## 2023-01-13 NOTE — Telephone Encounter (Signed)
*  STAT* If patient is at the pharmacy, call can be transferred to refill team.   1. Which medications need to be refilled? (please list name of each medication and dose if known) atorvastatin (LIPITOR) 80 MG tablet  dofetilide (TIKOSYN) 250 MCG capsule   clopidogrel (PLAVIX) 75 MG tablet    2. Which pharmacy/location (including street and city if local pharmacy) is medication to be sent to?  [facility] - [place] [address]      3. Do they need a 30 day or 90 day supply? 90 day    Pt is out of medication

## 2023-01-14 MED ORDER — DOFETILIDE 250 MCG PO CAPS: 250.0000 ug | ORAL_CAPSULE | Freq: Two times a day (BID) | ORAL | 1 refills | Status: DC

## 2023-01-14 MED ORDER — CLOPIDOGREL BISULFATE 75 MG PO TABS: 75.0000 mg | ORAL_TABLET | Freq: Every day | ORAL | 1 refills | Status: DC

## 2023-01-14 MED ORDER — ATORVASTATIN CALCIUM 80 MG PO TABS: 80.0000 mg | ORAL_TABLET | Freq: Every day | ORAL | 1 refills | Status: DC

## 2023-01-14 NOTE — Telephone Encounter (Signed)
Filled

## 2023-01-15 ENCOUNTER — Other Ambulatory Visit: Payer: Self-pay | Admitting: *Deleted

## 2023-01-15 MED ORDER — RIVAROXABAN 20 MG PO TABS: 20.0000 mg | ORAL_TABLET | Freq: Every day | ORAL | 1 refills | Status: DC

## 2023-01-15 NOTE — Telephone Encounter (Signed)
Patient would like someone to give her a call regarding her Plavix and if she's supposed to hold that also prior to surgery

## 2023-01-15 NOTE — Telephone Encounter (Signed)
Prescription refill request for Xarelto received.  Indication: AF Last office visit: 08/29/22  Dominga Ferry MD Weight: 121.6kg Age: 67 Scr: 0.78 on 08/16/22  Epic CrCl:  134.35  Based on above findings Xarelto 20mg  daily is the appropriate dose.  Refill approved.

## 2023-01-15 NOTE — Telephone Encounter (Signed)
Pre-op callback team, sorry Plavix was not listed on the initial pre-op clearance form. It is okay for her to hold Plavix for 5 days prior to procedure from a cardiac standpoint. However, it looks like she is also on Plavix due to prior peripheral artery stenting which is followed by Dr. Wyn Quaker [facility]). Therefore, she should also reach out to their office to make sure it is okay to hold the Plavix.  Can you please notify patient of this?  Thank you! Thorsten Climer

## 2023-01-15 NOTE — Telephone Encounter (Signed)
Left message for the patient to contact the office to schedule telehealth appt and get Plavix instructions from APP.

## 2023-01-16 NOTE — Telephone Encounter (Signed)
Patient is returning call and is requesting return call.

## 2023-01-17 NOTE — Telephone Encounter (Signed)
Returned call to pt, left another message for pt to call back.  

## 2023-01-20 ENCOUNTER — Telehealth (INDEPENDENT_AMBULATORY_CARE_PROVIDER_SITE_OTHER): Payer: Self-pay

## 2023-01-20 ENCOUNTER — Encounter: Payer: Self-pay | Admitting: Hematology

## 2023-01-20 NOTE — Telephone Encounter (Signed)
Contact patient to schedule abi's see fallon or dew

## 2023-01-20 NOTE — Telephone Encounter (Signed)
Patient is returning call.  °

## 2023-01-20 NOTE — Telephone Encounter (Signed)
It looks like we contacted her back in November to come in with ABIs for a similar complaint...try to reach out to her again

## 2023-01-20 NOTE — Telephone Encounter (Signed)
Patient left a message stating that both calves have been sore to touch for the past 3-4 weeks. Patient do feels some heat from both calves. Please Advise

## 2023-01-20 NOTE — Telephone Encounter (Signed)
Our office has tried to call the pt x 5 including today. Pt does call back however when we call back we get her vm.   Pt will need a tele preop appt, I will update the surgeon office. Will remove from pre op call back until the pt calls back to schedule tele preop op appt.

## 2023-01-21 NOTE — Telephone Encounter (Signed)
   Name: SANAAH MEGILL  DOB: 02-11-1956  MRN: [id number]   Primary Cardiologist: Dina Rich, MD  Chart reviewed as part of pre-operative protocol coverage. KESHEA CAMAJ was last evaluated via telephone visit on 01/02/2023. Per previous note:  According to the RCRI, patient has a 0.9% risk of MACE. Patient reports activity equivalent to 6.7 METS (per DASI).   Based on ACC/AHA guidelines, NOMI MACHOWSKI would be at acceptable risk for the planned procedure without further cardiovascular testing.   Per Dr. Wyline Mood, patient may hold Xarelto for 3 days prior to procedure and 5-7 days after. Patient may hold Plavix for 5 days prior to procedure. Both Plavix and Xarelto should resume as soon as hemodynamically stable postoperatively.     I will route this recommendation to the requesting party via Epic fax function and remove from pre-op pool.  Please call with questions.   Carlos Levering, NP 01/21/2023, 5:03 PM

## 2023-01-21 NOTE — Progress Notes (Unsigned)
Amy Tran, female    DOB: [birth year]    MRN: [id number]   Brief patient profile:  57 yowf  active smoker  referred to pulmonary clinic in [place]  10/10/2022 by Dr Wyline Mood for doe   PFT 04/19/16 >> FEV1 2.40 (94%), Ratio 0.77, TLC 5.34 (105%), DLCO 78%, RV 2.70 (134%), ERV 5% wt wt 289  FeNO 06/03/16 >> 11   History of Present Illness  10/10/2022  Pulmonary/ 1st office eval/ Jelani Trueba / [place] [facility]  Chief Complaint  Patient presents with   Establish Care   Asthma    [name] 2018  Dyspnea:  MMRC2 = can't walk a nl pace on a flat grade s sob but does fine slow and flat  - problem is also legs hurt/ back pain and numbness / [employer] parking  Cough: am congestion x sev min  Sleep: flat bed/ 2 pillows/s resp cc as long as stays off back due to pnds  SABA use: up to 2 -3 x per day helps to take it before ex  02: none  Lung cancer screen: referred  Rec The key is to stop smoking completely before smoking completely stops you!  My office will be contacting you by phone for referral to lung cancer screening program [phone])  > not done as of 01/22/2023    Plan A = Automatic = Always=   Symbicort 80 Take 2 puffs first thing in am and then optional  2 puffs about 12 hours later.  Wrk on inhaler technique:    Plan B = Backup (to supplement plan A, not to replace it) Only use your albuterol inhaler as a rescue medication Also  Ok to try albuterol x 2 puffs 15 min before an activity (on alternating days)  that you know would usually make you short of breath  For drainage / throat tickle try take CHLORPHENIRAMINE  4 mg   Please remember to go to the  x-ray department  > ok      Allergy screen 10/10/2022 >  Eos 0.2 /  IgE  167   01/22/2023  3 mf/u ov/[place] office/Shealee Yordy re: COPD 0/AB   maint on symbicort 80 prn also fosamax q Sunday    Chief Complaint  Patient presents with   Asthma   Cough  Dyspnea:  limited more by R foot burning  post a back surgery > going for TENS next   but no limiting doe and can't see any benefit from symbicort  Cough: none now but bad with fall =  spring assoc with  pnds  Sleeping: bed blocks/ on side 2 pillows s resp cc  SABA use: none   Lung cancer screening: referred again 01/22/2023    No obvious day to day or daytime variability or assoc excess/ purulent sputum or mucus plugs or hemoptysis or cp or chest tightness, subjective wheeze or overt  hb symptoms.    Also denies any obvious fluctuation of symptoms with weather or environmental changes or other aggravating or alleviating factors except as outlined above   No unusual exposure hx or h/o childhood pna/ asthma or knowledge of premature birth.  Current Allergies, Complete Past Medical History, Past Surgical History, Family History, and Social History were reviewed in [employer] record.  ROS  The following are not active complaints unless bolded Hoarseness, sore throat, dysphagia, dental problems, itching, sneezing,  nasal congestion or discharge of excess mucus or purulent secretions, ear ache,   fever, chills, sweats, unintended wt  loss or wt gain, classically pleuritic or exertional cp,  orthopnea pnd or arm/hand swelling  or leg swelling, presyncope, palpitations, abdominal pain, anorexia, nausea, vomiting, diarrhea  or change in bowel habits or change in bladder habits, change in stools or change in urine, dysuria, hematuria,  rash, arthralgias, visual complaints, headache, numbness, weakness or ataxia or problems with walking or coordination,  change in mood or  memory.        Current Meds  Medication Sig   albuterol (VENTOLIN HFA) 108 (90 Base) MCG/ACT inhaler Inhale 2 puffs into the lungs every 6 (six) hours as needed for wheezing or shortness of breath.   alendronate (FOSAMAX) 70 MG tablet Take 70 mg by mouth every Sunday.   atorvastatin (LIPITOR) 80 MG tablet Take 1 tablet (80 mg total) by mouth daily.   budesonide-formoterol (SYMBICORT) 80-4.5  MCG/ACT inhaler .Take 2 puffs first thing in am and then another 2 puffs about 12 hours later.   Cholecalciferol (VITAMIN D) 50 MCG (2000 UT) CAPS Take 2,000 Units by mouth daily.   clopidogrel (PLAVIX) 75 MG tablet Take 1 tablet (75 mg total) by mouth daily.   cyanocobalamin (VITAMIN B12) 500 MCG tablet Take 500 mcg by mouth daily.   cyclobenzaprine (FLEXERIL) 10 MG tablet Take 1 tablet (10 mg total) by mouth 3 (three) times daily as needed for muscle spasms.   dofetilide (TIKOSYN) 250 MCG capsule Take 1 capsule (250 mcg total) by mouth 2 (two) times daily.   fluticasone (FLONASE) 50 MCG/ACT nasal spray Place 2 sprays into both nostrils daily as needed for rhinitis (congestion/ dryness).   gabapentin (NEURONTIN) 300 MG capsule Take 300 mg by mouth 3 (three) times daily.   glipiZIDE (GLUCOTROL XL) 5 MG 24 hr tablet Take 5 mg by mouth every morning.   JARDIANCE 10 MG TABS tablet Take 10 mg by mouth daily.   ketoconazole (NIZORAL) 2 % cream Apply 1 Application topically 2 (two) times daily.   Lancets (ONETOUCH DELICA PLUS LANCET33G) MISC 1 each daily.   losartan (COZAAR) 100 MG tablet Take 100 mg by mouth daily with breakfast.    MOUNJARO 10 MG/0.5ML Pen Inject 10 mg into the skin once a week.   nitroGLYCERIN (NITROSTAT) 0.4 MG SL tablet Place 1 tablet (0.4 mg total) under the tongue every 5 (five) minutes as needed for chest pain.   potassium chloride SA (KLOR-CON) 20 MEQ tablet Take 1 tablet (20 mEq total) by mouth 2 (two) times daily. (Patient taking differently: Take 20 mEq by mouth in the morning, at noon, and at bedtime.)   rivaroxaban (XARELTO) 20 MG TABS tablet Take 1 tablet (20 mg total) by mouth daily with supper.   Skin Protectants, Misc. (EUCERIN) cream Apply 1 Application topically daily.   spironolactone (ALDACTONE) 25 MG tablet Take 25 mg by mouth daily with breakfast.    torsemide (DEMADEX) 20 MG tablet Take 60 mg by mouth daily after breakfast.            Past Medical History:   Diagnosis Date   Aortic stenosis    Arthritis    Asthma    Cancer (HCC)    multiple skin cancers   Coronary artery disease    Diabetes mellitus without complication (HCC)    Diverticulosis    Dysrhythmia    Fluid retention    GERD (gastroesophageal reflux disease)    High cholesterol    Hyperlipidemia    Hypertension    Incomplete rotator cuff tear    Myocardial  infarction [facility])    Obesity    Osteoporosis    PAD (peripheral artery disease) (HCC)    Persistent atrial fibrillation (HCC)    PONV (postoperative nausea and vomiting)    Wears glasses    Wears partial dentures    bottom      Objective:     wts  01/22/2023     251   10/10/22 265 lb (120.2 kg)  08/29/22 268 lb (121.6 kg)  08/26/22 268 lb 15.4 oz (122 kg)    Vital signs reviewed  01/22/2023  - Note at rest 02 sats  96% on RA   General appearance:    MO (by bmi) amb wf nad   HEENT : Oropharynx  clear      Nasal turbinates nl    NECK :  without  apparent JVD/ palpable Nodes/TM    LUNGS: no acc muscle use,  Nl contour chest which is clear to A and P bilaterally without cough on insp or exp maneuvers   CV:  RRR  no s3 or murmur or increase in P2, and no edema   ABD: obese  soft and nontender    MS:  Nl gait/ ext warm without deformities Or obvious joint restrictions  calf tenderness, cyanosis or clubbing    SKIN: warm and dry without lesions    NEURO:  alert, approp, nl sensorium with  no motor or cerebellar deficits apparent.             Assessment

## 2023-01-21 NOTE — Telephone Encounter (Signed)
S/w the pt today about tele pre op appt. See previous notes. Pt was recently cleared for injection and is now scheduled for spinal cord stimulator 01/27/23. I d/w further with the pre op APP Carlos Levering, NP today who states to me that she will clear the pt and send notes to surgeon's office. Pt has been made aware.

## 2023-01-22 ENCOUNTER — Ambulatory Visit: Payer: 59 | Admitting: Internal Medicine

## 2023-01-22 ENCOUNTER — Encounter: Payer: Self-pay | Admitting: Internal Medicine

## 2023-01-22 VITALS — BP 109/67 | HR 48 | Ht 65.0 in | Wt 251.0 lb

## 2023-01-22 DIAGNOSIS — F17210 Nicotine dependence, cigarettes, uncomplicated: Secondary | ICD-10-CM | POA: Diagnosis not present

## 2023-01-22 DIAGNOSIS — J4489 Other specified chronic obstructive pulmonary disease: Secondary | ICD-10-CM | POA: Diagnosis not present

## 2023-01-22 NOTE — Assessment & Plan Note (Signed)
Counseled re importance of smoking cessation but did not meet time criteria for separate billing    Low-dose CT lung cancer screening is recommended for patients who are 63-67 years of age with a 20+ pack-year history of smoking and who are currently smoking or quit <=15 years ago. No coughing up blood  No unintentional weight loss of > 15 pounds in the last 6 months - pt is eligible for scanning yearly until 34 y p quits > referred again 01/22/2023   Discussed in detail all the  indications, usual  risks and alternatives  relative to the benefits with patient who agrees to proceed with w/u as outlined.           Each maintenance medication was reviewed in detail including emphasizing most importantly the difference between maintenance and prns and under what circumstances the prns are to be triggered using an action plan format where appropriate.  Total time for H and P, chart review, counseling, reviewing hfa device(s) and generating customized AVS unique to this office visit / same day charting  > 30 min summary final f/u ov

## 2023-01-22 NOTE — Patient Instructions (Addendum)
Try taking symbicort 80 x 2 puffs on alternate days until you use your present inhaler up and if you can't convince yourself you are better, stop it   For itching / sneezing / nasal congestion > try zyrtec 10 mg before bed   My office will be contacting you by phone for referral to lung cancer screening clinic and  PFTs  [phone] - if you don't hear back from my office within one week please call us back or notify us thru MyChart and we'll address it right away.   The key is to stop smoking completely before smoking completely stops you!  Please schedule a follow up visit in 6  months but call sooner if needed

## 2023-01-22 NOTE — Assessment & Plan Note (Addendum)
Active smoker/ symptoms since 2018 PFT 04/19/16 >> FEV1 2.40 (94%), Ratio 0.77, TLC 5.34 (105%), DLCO 78%, RV 2.70 (134%), ERV 5% wt wt 289  FeNO 06/03/16 >> 11 Allergy screen 10/10/2022 >  Eos 0.2 /  IgE  167 - 10/10/2022   try symb 80 2bid prn no better 01/22/2023  - 01/22/2023  After extensive coaching inhaler device,  effectiveness =    80% and doesn't feel symbicort helping so ok to try every other day for a month then try off or prn going forward  She needs baseline pfts since still smoking but doubt evolving to copd given previous study - can f/u here prn

## 2023-01-22 NOTE — Assessment & Plan Note (Signed)
Body mass index is 41.77 kg/m.  -  trending down/ re-inforced importance Lab Results  Component Value Date   TSH 0.541 06/02/2020      Contributing to doe and risk of GERD/dvt/pe >>>   reviewed the need and the process to achieve and maintain neg calorie balance > defer f/u primary care including intermittently monitoring thyroid status

## 2023-01-27 DIAGNOSIS — M5416 Radiculopathy, lumbar region: Secondary | ICD-10-CM | POA: Diagnosis not present

## 2023-01-27 DIAGNOSIS — M961 Postlaminectomy syndrome, not elsewhere classified: Secondary | ICD-10-CM | POA: Diagnosis not present

## 2023-02-19 ENCOUNTER — Other Ambulatory Visit (INDEPENDENT_AMBULATORY_CARE_PROVIDER_SITE_OTHER): Payer: Self-pay | Admitting: Nurse Practitioner

## 2023-02-19 DIAGNOSIS — I739 Peripheral vascular disease, unspecified: Secondary | ICD-10-CM

## 2023-02-20 ENCOUNTER — Ambulatory Visit (INDEPENDENT_AMBULATORY_CARE_PROVIDER_SITE_OTHER): Payer: 59

## 2023-02-20 ENCOUNTER — Ambulatory Visit (INDEPENDENT_AMBULATORY_CARE_PROVIDER_SITE_OTHER): Payer: 59 | Admitting: Nurse Practitioner

## 2023-02-20 ENCOUNTER — Encounter (INDEPENDENT_AMBULATORY_CARE_PROVIDER_SITE_OTHER): Payer: Self-pay | Admitting: Nurse Practitioner

## 2023-02-20 VITALS — BP 125/73 | HR 52 | Resp 18 | Ht 65.0 in | Wt 243.6 lb

## 2023-02-20 DIAGNOSIS — I739 Peripheral vascular disease, unspecified: Secondary | ICD-10-CM | POA: Diagnosis not present

## 2023-02-20 DIAGNOSIS — M5416 Radiculopathy, lumbar region: Secondary | ICD-10-CM

## 2023-02-20 DIAGNOSIS — Z9889 Other specified postprocedural states: Secondary | ICD-10-CM

## 2023-02-20 DIAGNOSIS — I890 Lymphedema, not elsewhere classified: Secondary | ICD-10-CM | POA: Diagnosis not present

## 2023-02-21 LAB — VAS US ABI WITH/WO TBI
Left ABI: 1.3
Right ABI: 1.2

## 2023-02-21 NOTE — Progress Notes (Signed)
 Subjective:    Patient ID: Amy Tran, female    DOB: Sep 04, 1955, 68 y.o.   MRN: [id number] Chief Complaint  Patient presents with   Follow-up    fu legs + bilat ABI    The patient is a 68 year old female who presents today with initial complaint of tenderness and pain ongoing in her calves when she touches them.  She notes that she will be having a spinal stimulator placed soon and during the demo portion of this the spinal stimulator essentially alleviated all of her pain.  Once the trial was removed all her pain returned and this includes the tenderness that she currently has.  She denies any worsening claudication-like symptoms.  The patient does have lymphedema and notes that her swelling is worsening.  She has a lymphedema pump and did very well with her lymphedema pump however she does not have the help at home that she what she did and so she is no longer able to use this consistently.  She is also not been wearing her compression socks as consistently.  She has developed a small wound on her left lower extremity which did have evidence of cellulitis but none today.    Review of Systems  Cardiovascular:  Positive for leg swelling.  Musculoskeletal:  Positive for arthralgias.  Skin:  Positive for wound.  All other systems reviewed and are negative.      Objective:   Physical Exam Vitals reviewed.  HENT:     Head: Normocephalic.  Cardiovascular:     Rate and Rhythm: Normal rate.  Pulmonary:     Effort: Pulmonary effort is normal.  Musculoskeletal:     Right lower leg: 2+ Edema present.     Left lower leg: 3+ Edema present.  Skin:    General: Skin is warm and dry.  Neurological:     Mental Status: She is alert and oriented to person, place, and time.  Psychiatric:        Mood and Affect: Mood normal.        Behavior: Behavior normal.        Thought Content: Thought content normal.        Judgment: Judgment normal.     BP 125/73   Pulse (!) 52   Resp 18    Ht 5' 5 (1.651 m)   Wt 243 lb 9.6 oz (110.5 kg)   BMI 40.54 kg/m   Past Medical History:  Diagnosis Date   Aortic stenosis    Arthritis    Asthma    Cancer (HCC)    multiple skin cancers   Coronary artery disease    Diabetes mellitus without complication (HCC)    Diverticulosis    Dysrhythmia    Fluid retention    GERD (gastroesophageal reflux disease)    High cholesterol    Hyperlipidemia    Hypertension    Incomplete rotator cuff tear    Myocardial infarction (HCC)    Obesity    Osteoporosis    PAD (peripheral artery disease) (HCC)    Persistent atrial fibrillation (HCC)    PONV (postoperative nausea and vomiting)    Wears glasses    Wears partial dentures    bottom    Social History   Socioeconomic History   Marital status: Widowed    Spouse name: Not on file   Number of children: 2   Years of education: Not on file   Highest education level: Not on file  Occupational History  Occupation: works at [employer]   Tobacco Use   Smoking status: Every Day    Current packs/day: 1.50    Average packs/day: 1.5 packs/day for 52.9 years (79.3 ttl pk-yrs)    Types: Cigarettes    Start date: 04/09/1970   Smokeless tobacco: Never   Tobacco comments:    09/04/20 - states she is down to 9 ciggs / day   Vaping Use   Vaping status: Never Used  Substance and Sexual Activity   Alcohol use: Not Currently   Drug use: No   Sexual activity: Not Currently    Birth control/protection: Post-menopausal  Other Topics Concern   Not on file  Social History Narrative   Not on file   Social Drivers of Health   Financial Resource Strain: Not on file  Food Insecurity: Not on file  Transportation Needs: Not on file  Physical Activity: Unknown (09/15/2017)   Exercise Vital Sign    Days of Exercise per Week: Patient declined    Minutes of Exercise per Session: Patient declined  Stress: Unknown (09/15/2017)   [employer] of Occupational Health - Occupational  Stress Questionnaire    Feeling of Stress : Patient declined  Social Connections: Unknown (09/15/2017)   Social Connection and Isolation Panel [NHANES]    Frequency of Communication with Friends and Family: Patient declined    Frequency of Social Gatherings with Friends and Family: Patient declined    Attends Religious Services: Patient declined    Active Member of Clubs or Organizations: Patient declined    Attends Banker Meetings: Patient declined    Marital Status: Patient declined  Intimate Partner Violence: Unknown (09/15/2017)   Humiliation, Afraid, Rape, and Kick questionnaire    Fear of Current or Ex-Partner: Patient declined    Emotionally Abused: Patient declined    Physically Abused: Patient declined    Sexually Abused: Patient declined    Past Surgical History:  Procedure Laterality Date   APPENDECTOMY     CARDIOVERSION N/A 06/03/2019   Procedure: CARDIOVERSION;  Surgeon: Charls Pearla LABOR, MD;  Location: [facility];  Service: Cardiovascular;  Laterality: N/A;   CARDIOVERSION N/A 07/13/2019   Procedure: CARDIOVERSION;  Surgeon: Barbaraann Darryle Ned, MD;  Location: [facility] ENDOSCOPY;  Service: Cardiovascular;  Laterality: N/A;   CARPAL TUNNEL RELEASE Bilateral 2001   bilateral   CERVICAL POLYPECTOMY  04/08/2017   Procedure: POLYPECTOMY;  Surgeon: Edsel Norleen GAILS, MD;  Location: [facility];  Service: Gynecology;;  Endometrial   CHOLECYSTECTOMY     COLONOSCOPY  2007   MFM:[id number] internal hemorrhoids. Diminutive rectal polyp at 10 cm, cold biopsied/removed. The remainder of the rectal mucosa appeared normal Swallow left-sided diverticula. Diminutive polyp at the splenic flexure cold biopsied/removed (adenomatous)   COLONOSCOPY N/A 12/28/2013   Procedure: COLONOSCOPY;  Surgeon: Lamar CHRISTELLA Hollingshead, MD;  Location: [facility] SU[facility];  Service: Endoscopy;  Laterality: N/A;  730 - moved to 8:30 - Ginger notified pt   CORONARY STENT INTERVENTION N/A 04/27/2019   Procedure: CORONARY STENT  INTERVENTION;  Surgeon: Burnard Ned LABOR, MD;  Location: [facility] INVASIVE CV LAB;  Service: Cardiovascular;  Laterality: N/A;   CORONARY ULTRASOUND/IVUS N/A 09/12/2017   Procedure: INTRAVASCULAR ULTRASOUND/IVUS;  Surgeon: Harvey Carlin BRAVO, MD;  Location: [facility] INVASIVE CV LAB;  Service: Cardiovascular;  Laterality: N/A;   DILATION AND CURETTAGE, DIAGNOSTIC / THERAPEUTIC  03/07/2017   ERCP  2002   GASTRIC BYPASS  2004   HYSTEROSCOPY WITH D & C N/A 04/08/2017  Procedure: DILATATION AND CURETTAGE /HYSTEROSCOPY;  Surgeon: Edsel Norleen GAILS, MD;  Location: [facility];  Service: Gynecology;  Laterality: N/A;   LEFT HEART CATH AND CORONARY ANGIOGRAPHY N/A 04/27/2019   Procedure: LEFT HEART CATH AND CORONARY ANGIOGRAPHY;  Surgeon: Burnard Debby LABOR, MD;  Location: [facility] INVASIVE CV LAB;  Service: Cardiovascular;  Laterality: N/A;   LOWER EXTREMITY ANGIOGRAPHY Left 10/02/2020   Procedure: LOWER EXTREMITY ANGIOGRAPHY;  Surgeon: Marea Selinda RAMAN, MD;  Location: [facility] INVASIVE CV LAB;  Service: Cardiovascular;  Laterality: Left;   LUMBAR LAMINECTOMY/DECOMPRESSION MICRODISCECTOMY Right 03/15/2022   Procedure: Microdiscectomy - right - Lumbar Four-Lumbar Five extraforaminal;  Surgeon: Louis Shove, MD;  Location: [facility] OR;  Service: Neurosurgery;  Laterality: Right;   NECK SURGERY  1998   fusion   PERCUTANEOUS VENOUS THROMBECTOMY,LYSIS WITH INTRAVASCULAR ULTRASOUND (IVUS) Left 09/13/2017   Procedure: REMOVAL OF LYSIS CATHETER AND INTRAVASCULAR ULTRASOUND (IVUS) LEFT ILIAC VEIN;  Surgeon: Harvey Carlin BRAVO, MD;  Location: [facility] OR;  Service: Vascular;  Laterality: Left;   PERIPHERAL VASCULAR INTERVENTION  09/15/2017   Procedure: PERIPHERAL VASCULAR INTERVENTION;  Surgeon: Sheree Penne Bruckner, MD;  Location: [facility] INVASIVE CV LAB;  Service: Cardiovascular;;  VEINOUS/STENT   PERIPHERAL VASCULAR THROMBECTOMY Left 09/12/2017   Procedure: PERIPHERAL VASCULAR THROMBECTOMY;  Surgeon: Harvey Carlin BRAVO, MD;  Location: [facility] INVASIVE CV LAB;  Service:  Cardiovascular;  Laterality: Left;   PERIPHERAL VASCULAR THROMBECTOMY Left 09/15/2017   Procedure: PERIPHERAL VASCULAR THROMBECTOMY;  Surgeon: Sheree Penne Bruckner, MD;  [facility] INVASIVE CV LAB;  Service: Cardiovascular;  Laterality: Left;  IVC TO LT FEM/POP VEIN   SHOULDER ARTHROSCOPY WITH SUBACROMIAL DECOMPRESSION Left 06/23/2013   Procedure: LEFT SHOULDER ARTHROSCOPY WITH DEBRIDEMENT ROTATOR CUFF AND LABRUM;  Surgeon: Lamar LABOR Millman, MD;  Location: [facility];  Service: Orthopedics;  Laterality: Left;   SHOULDER SURGERY  1999   left    Family History  Problem Relation Age of Onset   Diabetes Father    Hypertension Father    Parkinson's disease Father    Cancer Father        not sure what kind   Hypertension Mother    Heart disease Mother    Drug abuse Sister    Mesothelioma Brother    Cancer Daughter        non-hodgkins lmphoma   Hypertension Brother    Diabetes Brother    Colon cancer Neg Hx    Gastric cancer Neg Hx    Esophageal cancer Neg Hx     Allergies  Allergen Reactions   Penicillins Hives and Rash    Has patient had a PCN reaction causing immediate rash, facial/tongue/throat swelling, SOB or lightheadedness with hypotension: Yes Has patient had a PCN reaction causing severe rash involving mucus membranes or skin necrosis: No Has patient had a PCN reaction that required hospitalization No Has patient had a PCN reaction occurring within the last 10 years: No If all of the above answers are NO, then may proceed with Cephalosporin use.    Doxycycline  Itching       Latest Ref Rng & Units 10/10/2022   10:03 AM 08/16/2022   12:47 PM 05/31/2022    1:53 PM  CBC  WBC 3.4 - 10.8 x10E3/uL 8.3  8.5  10.4   Hemoglobin 11.1 - 15.9 g/dL 86.2  86.5  85.9   Hematocrit 34.0 - 46.6 % 41.6  40.2  42.7   Platelets 150 - 450 x10E3/uL 266  237  272  CMP     Component Value Date/Time   NA 134 (L) 08/16/2022 1257   NA 137 11/03/2019 1123   K 3.1  (L) 08/16/2022 1257   CL 101 08/16/2022 1257   CO2 24 08/16/2022 1257   GLUCOSE 122 (H) 08/16/2022 1257   BUN 13 08/16/2022 1257   BUN 9 11/03/2019 1123   CREATININE 0.78 08/16/2022 1257   CALCIUM  8.4 (L) 08/16/2022 1257   PROT 7.5 08/16/2022 1247   ALBUMIN 3.9 08/16/2022 1247   AST 16 08/16/2022 1247   ALT 14 08/16/2022 1247   ALKPHOS 57 08/16/2022 1247   BILITOT 1.3 (H) 08/16/2022 1247   GFRNONAA >60 08/16/2022 1257     No results found.     Assessment & Plan:   1. Peripheral arterial disease with history of revascularization (HCC) (Primary) Today noninvasive studies show stable from previous studies in September.  We can follow her ABIs again in 1 year or sooner if issues arise  2. Lymphedema No surgery or intervention at this point in time.    I have had a long discussion with the patient regarding venous insufficiency and why it  causes symptoms, specifically venous ulceration. I have discussed with the patient the chronic skin changes that accompany venous insufficiency and the long term sequela such as infection and recurring  ulceration.  Patient will be placed in [employer] which will be changed weekly drainage permitting.  In addition, behavioral modification including several periods of elevation of the lower extremities during the day will be continued. Achieving a position with the ankles at heart level was stressed to the patient  The patient is instructed to begin routine exercise, especially walking on a daily basis   3. Lumbar radiculopathy The pain and the tenderness the patient is having in her calf is fully related to her lumbar radiculopathy.  She is having an upcoming spinal stimulator placed which should help this.  She notes that with the initial trial run of the spinal stimulator it actually treated and took away the pain that she describes and now that it has been removed and the pain is all returned.  No further vascular intervention necessary for  this   Current Outpatient Medications on File Prior to Visit  Medication Sig Dispense Refill   albuterol  (VENTOLIN  HFA) 108 (90 Base) MCG/ACT inhaler Inhale 2 puffs into the lungs every 6 (six) hours as needed for wheezing or shortness of breath.     alendronate (FOSAMAX) 70 MG tablet Take 70 mg by mouth every Sunday.     atorvastatin  (LIPITOR ) 80 MG tablet Take 1 tablet (80 mg total) by mouth daily. 90 tablet 1   budesonide -formoterol  (SYMBICORT ) 80-4.5 MCG/ACT inhaler .Take 2 puffs first thing in am and then another 2 puffs about 12 hours later. 1 each 12   Cholecalciferol  (VITAMIN D ) 50 MCG (2000 UT) CAPS Take 2,000 Units by mouth daily.     clopidogrel  (PLAVIX ) 75 MG tablet Take 1 tablet (75 mg total) by mouth daily. 90 tablet 1   cyanocobalamin  (VITAMIN B12) 500 MCG tablet Take 500 mcg by mouth daily.     cyclobenzaprine  (FLEXERIL ) 10 MG tablet Take 1 tablet (10 mg total) by mouth 3 (three) times daily as needed for muscle spasms. 30 tablet 3   dofetilide  (TIKOSYN ) 250 MCG capsule Take 1 capsule (250 mcg total) by mouth 2 (two) times daily. 180 capsule 1   fluticasone  (FLONASE ) 50 MCG/ACT nasal spray Place 2 sprays into both nostrils daily as  needed for rhinitis (congestion/ dryness).     gabapentin (NEURONTIN) 300 MG capsule Take 300 mg by mouth 3 (three) times daily.     glipiZIDE  (GLUCOTROL  XL) 5 MG 24 hr tablet Take 5 mg by mouth every morning.     JARDIANCE  10 MG TABS tablet Take 10 mg by mouth daily.     ketoconazole  (NIZORAL ) 2 % cream Apply 1 Application topically 2 (two) times daily.     Lancets (ONETOUCH DELICA PLUS LANCET33G) MISC 1 each daily.     losartan  (COZAAR ) 100 MG tablet Take 100 mg by mouth daily with breakfast.      MOUNJARO 10 MG/0.5ML Pen Inject 10 mg into the skin once a week.     nitroGLYCERIN  (NITROSTAT ) 0.4 MG SL tablet Place 1 tablet (0.4 mg total) under the tongue every 5 (five) minutes as needed for chest pain. 30 tablet 0   potassium chloride  SA (KLOR-CON )  20 MEQ tablet Take 1 tablet (20 mEq total) by mouth 2 (two) times daily. (Patient taking differently: Take 20 mEq by mouth in the morning, at noon, and at bedtime.)     rivaroxaban  (XARELTO ) 20 MG TABS tablet Take 1 tablet (20 mg total) by mouth daily with supper. 90 tablet 1   Skin Protectants, Misc. (EUCERIN) cream Apply 1 Application topically daily.     spironolactone  (ALDACTONE ) 25 MG tablet Take 25 mg by mouth daily with breakfast.      torsemide  (DEMADEX ) 20 MG tablet Take 60 mg by mouth daily after breakfast.      No current facility-administered medications on file prior to visit.    There are no Patient Instructions on file for this visit. No follow-ups on file.   Azhar Knope E Jalacia Mattila, NP

## 2023-02-26 ENCOUNTER — Inpatient Hospital Stay: Payer: 59

## 2023-02-26 ENCOUNTER — Telehealth (INDEPENDENT_AMBULATORY_CARE_PROVIDER_SITE_OTHER): Payer: Self-pay

## 2023-02-26 NOTE — Telephone Encounter (Signed)
 Patient left a message stating she had some leg pain on Monday night and she removed the unna wrap on Tuesday morning. Patient believes that her pain is related to back pain.The patient states she will use her lymphedema pump daily as directed but will keep appointment as schedule. Patient was advise elevate, wear compression and to contact the office if she has any changes.

## 2023-02-27 ENCOUNTER — Encounter (INDEPENDENT_AMBULATORY_CARE_PROVIDER_SITE_OTHER): Payer: 59

## 2023-02-27 ENCOUNTER — Encounter (INDEPENDENT_AMBULATORY_CARE_PROVIDER_SITE_OTHER): Payer: Self-pay

## 2023-03-05 ENCOUNTER — Inpatient Hospital Stay: Payer: 59 | Admitting: Physician Assistant

## 2023-03-06 ENCOUNTER — Encounter (INDEPENDENT_AMBULATORY_CARE_PROVIDER_SITE_OTHER): Payer: 59

## 2023-03-06 ENCOUNTER — Encounter (INDEPENDENT_AMBULATORY_CARE_PROVIDER_SITE_OTHER): Payer: Self-pay

## 2023-03-06 NOTE — Telephone Encounter (Signed)
Patient called this morning to cancel her [facility] appointment. States she is unable to make it today. States she still has sore on leg. She has surgery scheduled for tomorrow and just wants her next Unna appointment to be 03/13/23. FYI

## 2023-03-06 NOTE — Telephone Encounter (Signed)
That is fine 

## 2023-03-07 DIAGNOSIS — M961 Postlaminectomy syndrome, not elsewhere classified: Secondary | ICD-10-CM | POA: Diagnosis not present

## 2023-03-07 DIAGNOSIS — G894 Chronic pain syndrome: Secondary | ICD-10-CM | POA: Diagnosis not present

## 2023-03-07 DIAGNOSIS — M5380 Other specified dorsopathies, site unspecified: Secondary | ICD-10-CM | POA: Diagnosis not present

## 2023-03-07 NOTE — Telephone Encounter (Signed)
Lvm

## 2023-03-13 ENCOUNTER — Ambulatory Visit (INDEPENDENT_AMBULATORY_CARE_PROVIDER_SITE_OTHER): Payer: 59 | Admitting: Nurse Practitioner

## 2023-03-13 ENCOUNTER — Encounter (INDEPENDENT_AMBULATORY_CARE_PROVIDER_SITE_OTHER): Payer: Self-pay

## 2023-03-13 ENCOUNTER — Ambulatory Visit (HOSPITAL_COMMUNITY): Admission: RE | Admit: 2023-03-13 | Payer: 59 | Source: Ambulatory Visit

## 2023-03-13 VITALS — BP 135/68 | HR 52 | Resp 16

## 2023-03-13 DIAGNOSIS — I890 Lymphedema, not elsewhere classified: Secondary | ICD-10-CM | POA: Diagnosis not present

## 2023-03-13 NOTE — Progress Notes (Unsigned)
History of Present Illness  There is no documented history at this time  Assessments & Plan   There are no diagnoses linked to this encounter.    Additional instructions  Subjective:  Patient presents with venous ulcer of the Bilateral lower extremity.    Procedure:  3 layer unna wrap was placed Bilateral lower extremity.   Plan:   Follow up in one week.

## 2023-03-20 ENCOUNTER — Encounter (INDEPENDENT_AMBULATORY_CARE_PROVIDER_SITE_OTHER): Payer: Self-pay

## 2023-03-20 ENCOUNTER — Ambulatory Visit (INDEPENDENT_AMBULATORY_CARE_PROVIDER_SITE_OTHER): Payer: 59 | Admitting: Nurse Practitioner

## 2023-03-20 VITALS — BP 143/72 | HR 46 | Resp 18 | Ht 63.0 in | Wt 243.0 lb

## 2023-03-20 DIAGNOSIS — I890 Lymphedema, not elsewhere classified: Secondary | ICD-10-CM

## 2023-03-20 NOTE — Progress Notes (Signed)
 History of Present Illness  There is no documented history at this time  Assessments & Plan   There are no diagnoses linked to this encounter.    Additional instructions  Subjective:  Patient presents with venous ulcer of the Bilateral lower extremity.    Procedure:  3 layer unna wrap was placed Bilateral lower extremity.   Plan:   Follow up in one week.

## 2023-03-24 ENCOUNTER — Encounter: Payer: Self-pay | Admitting: Emergency Medicine

## 2023-03-24 ENCOUNTER — Encounter (INDEPENDENT_AMBULATORY_CARE_PROVIDER_SITE_OTHER): Payer: Self-pay | Admitting: Nurse Practitioner

## 2023-03-28 ENCOUNTER — Ambulatory Visit (INDEPENDENT_AMBULATORY_CARE_PROVIDER_SITE_OTHER): Payer: 59 | Admitting: Vascular Surgery

## 2023-03-28 ENCOUNTER — Encounter (INDEPENDENT_AMBULATORY_CARE_PROVIDER_SITE_OTHER): Payer: Self-pay | Admitting: Vascular Surgery

## 2023-03-28 VITALS — BP 149/84 | HR 51 | Resp 18 | Ht 64.0 in | Wt 237.4 lb

## 2023-03-28 DIAGNOSIS — I890 Lymphedema, not elsewhere classified: Secondary | ICD-10-CM | POA: Diagnosis not present

## 2023-03-28 DIAGNOSIS — I10 Essential (primary) hypertension: Secondary | ICD-10-CM

## 2023-03-28 DIAGNOSIS — E1165 Type 2 diabetes mellitus with hyperglycemia: Secondary | ICD-10-CM

## 2023-03-28 NOTE — Progress Notes (Signed)
MRN : [id number]  Amy Tran is a 68 y.o. ([birth year]) female who presents with chief complaint of  Chief Complaint  Patient presents with   Follow-up    F/u unnaboot  .  History of Present Illness: Patient returns today in follow up of leg swelling and ulceration.  She still has persistent ulceration of the left anterior lateral calf.  This is clean and appears to be healing reasonably well although slowly.  She has been getting weekly Unna boots to the left lower extremity.  Her right leg swelling is improved but not resolved.  No fevers or chills.  No chest pain or shortness of breath.  Current Outpatient Medications  Medication Sig Dispense Refill   albuterol (VENTOLIN HFA) 108 (90 Base) MCG/ACT inhaler Inhale 2 puffs into the lungs every 6 (six) hours as needed for wheezing or shortness of breath.     alendronate (FOSAMAX) 70 MG tablet Take 70 mg by mouth every Sunday.     atorvastatin (LIPITOR) 80 MG tablet Take 1 tablet (80 mg total) by mouth daily. 90 tablet 1   budesonide-formoterol (SYMBICORT) 80-4.5 MCG/ACT inhaler .Take 2 puffs first thing in am and then another 2 puffs about 12 hours later. 1 each 12   Cholecalciferol (VITAMIN D) 50 MCG (2000 UT) CAPS Take 2,000 Units by mouth daily.     clopidogrel (PLAVIX) 75 MG tablet Take 1 tablet (75 mg total) by mouth daily. 90 tablet 1   cyanocobalamin (VITAMIN B12) 500 MCG tablet Take 500 mcg by mouth daily.     cyclobenzaprine (FLEXERIL) 10 MG tablet Take 1 tablet (10 mg total) by mouth 3 (three) times daily as needed for muscle spasms. 30 tablet 3   dofetilide (TIKOSYN) 250 MCG capsule Take 1 capsule (250 mcg total) by mouth 2 (two) times daily. 180 capsule 1   fluticasone (FLONASE) 50 MCG/ACT nasal spray Place 2 sprays into both nostrils daily as needed for rhinitis (congestion/ dryness).     gabapentin (NEURONTIN) 300 MG capsule Take 300 mg by mouth 3 (three) times daily.     glipiZIDE (GLUCOTROL XL) 5 MG 24 hr tablet Take  5 mg by mouth every morning.     JARDIANCE 10 MG TABS tablet Take 10 mg by mouth daily.     ketoconazole (NIZORAL) 2 % cream Apply 1 Application topically 2 (two) times daily.     Lancets (ONETOUCH DELICA PLUS LANCET33G) MISC 1 each daily.     losartan (COZAAR) 100 MG tablet Take 100 mg by mouth daily with breakfast.      MOUNJARO 10 MG/0.5ML Pen Inject 10 mg into the skin once a week.     nitroGLYCERIN (NITROSTAT) 0.4 MG SL tablet Place 1 tablet (0.4 mg total) under the tongue every 5 (five) minutes as needed for chest pain. 30 tablet 0   potassium chloride SA (KLOR-CON) 20 MEQ tablet Take 1 tablet (20 mEq total) by mouth 2 (two) times daily. (Patient taking differently: Take 20 mEq by mouth in the morning, at noon, and at bedtime.)     rivaroxaban (XARELTO) 20 MG TABS tablet Take 1 tablet (20 mg total) by mouth daily with supper. 90 tablet 1   Skin Protectants, Misc. (EUCERIN) cream Apply 1 Application topically daily.     spironolactone (ALDACTONE) 25 MG tablet Take 25 mg by mouth daily with breakfast.      torsemide (DEMADEX) 20 MG tablet Take 60 mg by mouth daily after breakfast.  No current facility-administered medications for this visit.    Past Medical History:  Diagnosis Date   Aortic stenosis    Arthritis    Asthma    Cancer (HCC)    multiple skin cancers   Coronary artery disease    Diabetes mellitus without complication (HCC)    Diverticulosis    Dysrhythmia    Fluid retention    GERD (gastroesophageal reflux disease)    High cholesterol    Hyperlipidemia    Hypertension    Incomplete rotator cuff tear    Myocardial infarction (HCC)    Obesity    Osteoporosis    PAD (peripheral artery disease) (HCC)    Persistent atrial fibrillation (HCC)    PONV (postoperative nausea and vomiting)    Wears glasses    Wears partial dentures    bottom    Past Surgical History:  Procedure Laterality Date   APPENDECTOMY     CARDIOVERSION N/A 06/03/2019   Procedure:  CARDIOVERSION;  Surgeon: Laqueta Linden, MD;  Location: [facility];  Service: Cardiovascular;  Laterality: N/A;   CARDIOVERSION N/A 07/13/2019   Procedure: CARDIOVERSION;  Surgeon: Sande Rives, MD;  Location: [facility] ENDOSCOPY;  Service: Cardiovascular;  Laterality: N/A;   CARPAL TUNNEL RELEASE Bilateral 2001   bilateral   CERVICAL POLYPECTOMY  04/08/2017   Procedure: POLYPECTOMY;  Surgeon: Tilda Burrow, MD;  Location: [facility];  Service: Gynecology;;  Endometrial   CHOLECYSTECTOMY     COLONOSCOPY  2007   WUJ:WJXBJYN internal hemorrhoids. Diminutive rectal polyp at 10 cm, cold biopsied/removed. The remainder of the rectal mucosa appeared normal Swallow left-sided diverticula. Diminutive polyp at the splenic flexure cold biopsied/removed (adenomatous)   COLONOSCOPY N/A 12/28/2013   Procedure: COLONOSCOPY;  Surgeon: Corbin Ade, MD;  Location: [facility] END[facility] SUITE;  Service: Endoscopy;  Laterality: N/A;  730 - moved to 8:30 - Ginger notified pt   CORONARY STENT INTERVENTION N/A 04/27/2019   Procedure: CORONARY STENT INTERVENTION;  Surgeon: Lennette Bihari, MD;  Location: [facility] INVASIVE CV LAB;  Service: Cardiovascular;  Laterality: N/A;   CORONARY ULTRASOUND/IVUS N/A 09/12/2017   Procedure: INTRAVASCULAR ULTRASOUND/IVUS;  Surgeon: Sherren Kerns, MD;  Location: [facility] INV[facility] CV LAB;  Service: Cardiovascular;  Laterality: N/A;   DILATION AND CURETTAGE, DIAGNOSTIC / THERAPEUTIC  03/07/2017   ERCP  2002   GASTRIC BYPASS  2004   HYSTEROSCOPY WITH D & C N/A 04/08/2017   Procedure: DILATATION AND CURETTAGE /HYSTEROSCOPY;  Surgeon: Tilda Burrow, MD;  Location: [facility];  Service: Gynecology;  Laterality: N/A;   LEFT HEART CATH AND CORONARY ANGIOGRAPHY N/A 04/27/2019   Procedure: LEFT HEART CATH AND CORONARY ANGIOGRAPHY;  Surgeon: Lennette Bihari, MD;  Location: [facility];  Service: Cardiovascular;  Laterality: N/A;   LOWER EXTREMITY ANGIOGRAPHY Left 10/02/2020   Procedure: LOWER EXTREMITY  ANGIOGRAPHY;  Surgeon: Annice Needy, MD;  Location: [facility] INVASIVE CV LAB;  Service: Cardiovascular;  Laterality: Left;   LUMBAR LAMINECTOMY/DECOMPRESSION MICRODISCECTOMY Right 03/15/2022   Procedure: Microdiscectomy - right - Lumbar Four-Lumbar Five extraforaminal;  Surgeon: Julio Sicks, MD;  Location: [facility] OR;  Service: Neurosurgery;  Laterality: Right;   NECK SURGERY  1998   fusion   PERCUTANEOUS VENOUS THROMBECTOMY,LYSIS WITH INTRAVASCULAR ULTRASOUND (IVUS) Left 09/13/2017   Procedure: REMOVAL OF LYSIS CATHETER AND INTRAVASCULAR ULTRASOUND (IVUS) LEFT ILIAC VEIN;  Surgeon: Sherren Kerns, MD;  Location: [facility] OR;  Service: Vascular;  Laterality: Left;   PERIPHERAL VASCULAR INTERVENTION  09/15/2017   Procedure: PERIPHERAL  VASCULAR INTERVENTION;  Surgeon: Maeola Harman, MD;  Location: [facility] INVASIVE CV LAB;  Service: Cardiovascular;;  VEINOUS/STENT   PERIPHERAL VASCULAR THROMBECTOMY Left 09/12/2017   Procedure: PERIPHERAL VASCULAR THROMBECTOMY;  Surgeon: Sherren Kerns, MD;  [facility]: [facility];  Service: Cardiovascular;  Laterality: Left;   PERIPHERAL VASCULAR THROMBECTOMY Left 09/15/2017   Procedure: PERIPHERAL VASCULAR THROMBECTOMY;  Surgeon: Maeola Harman, MD;  Location: [facility] INVASIVE CV LAB;  Service: Cardiovascular;  Laterality: Left;  IVC TO LT FEM/POP VEIN   SHOULDER ARTHROSCOPY WITH SUBACROMIAL DECOMPRESSION Left 06/23/2013   Procedure: LEFT SHOULDER ARTHROSCOPY WITH DEBRIDEMENT ROTATOR CUFF AND LABRUM;  Surgeon: Nilda Simmer, MD;  Location: [facility];  Service: Orthopedics;  Laterality: Left;   SHOULDER SURGERY  1999   left     Social History   Tobacco Use   Smoking status: Every Day    Current packs/day: 1.50    Average packs/day: 1.5 packs/day for 53.0 years (79.4 ttl pk-yrs)    Types: Cigarettes    Start date: 04/09/1970   Smokeless tobacco: Never   Tobacco comments:    09/04/20 - states she is down to 9 ciggs / day   Vaping Use    Vaping status: Never Used  Substance Use Topics   Alcohol use: Not Currently   Drug use: No       Family History  Problem Relation Age of Onset   Diabetes Father    Hypertension Father    Parkinson's disease Father    Cancer Father        not sure what kind   Hypertension Mother    Heart disease Mother    Drug abuse Sister    Mesothelioma Brother    Cancer Daughter        non-hodgkins lmphoma   Hypertension Brother    Diabetes Brother    Colon cancer Neg Hx    Gastric cancer Neg Hx    Esophageal cancer Neg Hx      Allergies  Allergen Reactions   Penicillins Hives and Rash    Has patient had a PCN reaction causing immediate rash, facial/tongue/throat swelling, SOB or lightheadedness with hypotension: Yes Has patient had a PCN reaction causing severe rash involving mucus membranes or skin necrosis: No Has patient had a PCN reaction that required hospitalization No Has patient had a PCN reaction occurring within the last 10 years: No If all of the above answers are "NO", then may proceed with Cephalosporin use.    Doxycycline Itching     REVIEW OF SYSTEMS (Negative unless checked)  Constitutional: [] Weight loss  [] Fever  [] Chills Cardiac: [] Chest pain   [] Chest pressure   [] Palpitations   [] Shortness of breath when laying flat   [] Shortness of breath at rest   [] Shortness of breath with exertion. Vascular:  [] Pain in legs with walking   [] Pain in legs at rest   [] Pain in legs when laying flat   [] Claudication   [] Pain in feet when walking  [] Pain in feet at rest  [] Pain in feet when laying flat   [] History of DVT   [] Phlebitis   [] Swelling in legs   [] Varicose veins   [] Non-healing ulcers Pulmonary:   [] Uses home oxygen   [] Productive cough   [] Hemoptysis   [] Wheeze  [] COPD   [] Asthma Neurologic:  [] Dizziness  [] Blackouts   [] Seizures   [] History of stroke   [] History of TIA  [] Aphasia   [] Temporary blindness   [] Dysphagia   [] Weakness  or numbness in arms   [] Weakness or  numbness in legs Musculoskeletal:  [x] Arthritis   [] Joint swelling   [x] Joint pain   [] Low back pain Hematologic:  [] Easy bruising  [] Easy bleeding   [] Hypercoagulable state   [] Anemic   Gastrointestinal:  [] Blood in stool   [] Vomiting blood  [] Gastroesophageal reflux/heartburn   [] Abdominal pain Genitourinary:  [] Chronic kidney disease   [] Difficult urination  [] Frequent urination  [] Burning with urination   [] Hematuria Skin:  [] Rashes   [x] Ulcers   [x] Wounds Psychological:  [] History of anxiety   []  History of major depression.  Physical Examination  BP (!) 149/84   Pulse (!) 51   Resp 18   Ht 5\' 4"  (1.626 m)   Wt 237 lb 6.4 oz (107.7 kg)   BMI 40.75 kg/m  Gen:  WD/WN, NAD Head: [place]/AT, No temporalis wasting. Ear/Nose/Throat: Hearing grossly intact, nares w/o erythema or drainage Eyes: Conjunctiva clear. Sclera non-icteric Neck: Supple.  Trachea midline Pulmonary:  Good air movement, no use of accessory muscles.  Cardiac: bradycardia Vascular:  Vessel Right Left  Radial Palpable Palpable           Musculoskeletal: M/S 5/5 throughout.  No deformity or atrophy.  1+ right lower extremity edema, 2+ left lower extremity edema.  4 to 5 cm oval-shaped wound is present on the left anterior lateral lower leg.  Moderate stasis dermatitis changes are present left worse than right. Neurologic: Sensation grossly intact in extremities.  Symmetrical.  Speech is fluent.  Psychiatric: Judgment intact, Mood & affect appropriate for pt's clinical situation. Dermatologic: No rashes or ulcers noted.  No cellulitis or open wounds.      Labs Recent Results (from the past 2160 hours)  VAS [place] ABI WITH/WO TBI     Status: None   Collection Time: 02/20/23 10:49 AM  Result Value Ref Range   Right ABI 1.20    Left ABI 1.30     Radiology No results found.  Assessment/Plan  Lymphedema The patient has severe lymphedema with persistent swelling and ulceration of the left lower extremity.  We are  going to continue weekly Unna boots for the next 4 to 5 weeks and then reassess for coming out of into boots.  The right lower extremity can go to a compression sock as the skin is intact and the swelling is improved although far from resolved.  She would clearly benefit from daily use of a lymphedema pump.  Type 2 diabetes mellitus with hyperglycemia, without long-term current use of insulin (HCC) blood glucose control important in reducing the progression of atherosclerotic disease. Also, involved in wound healing. On appropriate medications.   Essential hypertension blood pressure control important in reducing the progression of atherosclerotic disease. On appropriate oral medications.    Festus Barren, MD  03/28/2023 1:14 PM    This note was created with Dragon medical transcription system.  Any errors from dictation are purely unintentional

## 2023-03-28 NOTE — Assessment & Plan Note (Signed)
blood pressure control important in reducing the progression of atherosclerotic disease. On appropriate oral medications.

## 2023-03-28 NOTE — Assessment & Plan Note (Signed)
blood glucose control important in reducing the progression of atherosclerotic disease. Also, involved in wound healing. On appropriate medications.

## 2023-03-28 NOTE — Assessment & Plan Note (Signed)
The patient has severe lymphedema with persistent swelling and ulceration of the left lower extremity.  We are going to continue weekly Unna boots for the next 4 to 5 weeks and then reassess for coming out of into boots.  The right lower extremity can go to a compression sock as the skin is intact and the swelling is improved although far from resolved.  She would clearly benefit from daily use of a lymphedema pump.

## 2023-04-04 ENCOUNTER — Encounter (INDEPENDENT_AMBULATORY_CARE_PROVIDER_SITE_OTHER): Payer: Self-pay

## 2023-04-04 ENCOUNTER — Encounter (INDEPENDENT_AMBULATORY_CARE_PROVIDER_SITE_OTHER): Payer: 59

## 2023-04-08 DIAGNOSIS — R202 Paresthesia of skin: Secondary | ICD-10-CM | POA: Diagnosis not present

## 2023-04-11 ENCOUNTER — Ambulatory Visit (INDEPENDENT_AMBULATORY_CARE_PROVIDER_SITE_OTHER): Payer: 59 | Admitting: Nurse Practitioner

## 2023-04-11 ENCOUNTER — Encounter (INDEPENDENT_AMBULATORY_CARE_PROVIDER_SITE_OTHER): Payer: Self-pay

## 2023-04-11 VITALS — BP 126/69 | HR 52 | Resp 16 | Wt 234.4 lb

## 2023-04-11 DIAGNOSIS — I890 Lymphedema, not elsewhere classified: Secondary | ICD-10-CM

## 2023-04-11 NOTE — Progress Notes (Signed)
 Patient came in today for left le unna wrap. The left lower extremity currently has no open wounds or swelling. Patient will come out of wraps and keep upcoming follow up as scheduled. Patient was giving a prescription for compression stockings. Patient was advise to put stockings in during the day and to return stockings at night.

## 2023-04-13 ENCOUNTER — Encounter (INDEPENDENT_AMBULATORY_CARE_PROVIDER_SITE_OTHER): Payer: Self-pay | Admitting: Nurse Practitioner

## 2023-04-14 DIAGNOSIS — H353111 Nonexudative age-related macular degeneration, right eye, early dry stage: Secondary | ICD-10-CM | POA: Diagnosis not present

## 2023-04-15 ENCOUNTER — Ambulatory Visit (HOSPITAL_COMMUNITY)
Admission: RE | Admit: 2023-04-15 | Discharge: 2023-04-15 | Disposition: A | Discharge status: Home / Self Care | Payer: 59 | Source: Ambulatory Visit | Attending: Internal Medicine | Admitting: Internal Medicine

## 2023-04-15 DIAGNOSIS — J4489 Other specified chronic obstructive pulmonary disease: Secondary | ICD-10-CM | POA: Insufficient documentation

## 2023-04-15 LAB — PULMONARY FUNCTION TEST
DL/VA % pred: 83 %
DL/VA: 3.5 ml/min/mmHg/L
DLCO unc % pred: 93 %
DLCO unc: 18.12 ml/min/mmHg
FEF 25-75 Post: 1.91 L/s
FEF 25-75 Pre: 2 L/s
FEF2575-%Change-Post: -4 %
FEF2575-%Pred-Post: 95 %
FEF2575-%Pred-Pre: 100 %
FEV1-%Change-Post: -7 %
FEV1-%Pred-Post: 97 %
FEV1-%Pred-Pre: 105 %
FEV1-Post: 2.28 L
FEV1-Pre: 2.45 L
FEV1FVC-%Change-Post: -14 %
FEV1FVC-%Pred-Pre: 96 %
FEV6-%Change-Post: 8 %
FEV6-%Pred-Post: 121 %
FEV6-%Pred-Pre: 111 %
FEV6-Post: 3.55 L
FEV6-Pre: 3.26 L
FEV6FVC-%Change-Post: 0 %
FEV6FVC-%Pred-Post: 102 %
FEV6FVC-%Pred-Pre: 103 %
FVC-%Change-Post: 8 %
FVC-%Pred-Post: 117 %
FVC-%Pred-Pre: 108 %
FVC-Post: 3.61 L
FVC-Pre: 3.33 L
Post FEV1/FVC ratio: 63 %
Post FEV6/FVC ratio: 98 %
Pre FEV1/FVC ratio: 74 %
Pre FEV6/FVC Ratio: 99 %
RV % pred: 156 %
RV: 3.35 L
TLC % pred: 134 %
TLC: 6.78 L

## 2023-04-15 MED ORDER — ALBUTEROL SULFATE (2.5 MG/3ML) 0.083% IN NEBU
2.5000 mg | INHALATION_SOLUTION | Freq: Once | RESPIRATORY_TRACT | Status: AC
  Administered 2023-04-15: 2.5 mg via RESPIRATORY_TRACT

## 2023-04-16 DIAGNOSIS — Z9689 Presence of other specified functional implants: Secondary | ICD-10-CM | POA: Diagnosis not present

## 2023-04-16 DIAGNOSIS — M5412 Radiculopathy, cervical region: Secondary | ICD-10-CM | POA: Diagnosis not present

## 2023-04-18 ENCOUNTER — Encounter (INDEPENDENT_AMBULATORY_CARE_PROVIDER_SITE_OTHER): Payer: 59

## 2023-04-21 DIAGNOSIS — R051 Acute cough: Secondary | ICD-10-CM | POA: Diagnosis not present

## 2023-04-23 ENCOUNTER — Other Ambulatory Visit (INDEPENDENT_AMBULATORY_CARE_PROVIDER_SITE_OTHER): Payer: Self-pay | Admitting: Nurse Practitioner

## 2023-04-23 DIAGNOSIS — Z9889 Other specified postprocedural states: Secondary | ICD-10-CM

## 2023-04-25 ENCOUNTER — Ambulatory Visit (INDEPENDENT_AMBULATORY_CARE_PROVIDER_SITE_OTHER): Payer: 59 | Admitting: Nurse Practitioner

## 2023-04-28 ENCOUNTER — Ambulatory Visit (INDEPENDENT_AMBULATORY_CARE_PROVIDER_SITE_OTHER): Payer: 59 | Admitting: Nurse Practitioner

## 2023-04-28 ENCOUNTER — Encounter (INDEPENDENT_AMBULATORY_CARE_PROVIDER_SITE_OTHER): Payer: 59

## 2023-05-13 DIAGNOSIS — E1129 Type 2 diabetes mellitus with other diabetic kidney complication: Secondary | ICD-10-CM | POA: Diagnosis not present

## 2023-05-13 DIAGNOSIS — Z79899 Other long term (current) drug therapy: Secondary | ICD-10-CM | POA: Diagnosis not present

## 2023-05-13 DIAGNOSIS — E785 Hyperlipidemia, unspecified: Secondary | ICD-10-CM | POA: Diagnosis not present

## 2023-05-13 DIAGNOSIS — E876 Hypokalemia: Secondary | ICD-10-CM | POA: Diagnosis not present

## 2023-05-13 DIAGNOSIS — I10 Essential (primary) hypertension: Secondary | ICD-10-CM | POA: Diagnosis not present

## 2023-06-04 ENCOUNTER — Telehealth: Payer: Self-pay | Admitting: Internal Medicine

## 2023-06-04 NOTE — Telephone Encounter (Signed)
 LVM ffor the  to call and discuss rescheduling the 07/30/23 11:45 am appointment with Dr. Waymond Hailey (provider schedule change)

## 2023-06-05 NOTE — Telephone Encounter (Signed)
 LVM for patient to call and discuss rescheduling the 07/30/23 11:45 am appointment t with Dr. Lajean Pike is not in the office

## 2023-06-09 NOTE — Telephone Encounter (Signed)
 Patient is not at home and will call me to discuss rescheduling

## 2023-06-13 DIAGNOSIS — I10 Essential (primary) hypertension: Secondary | ICD-10-CM | POA: Diagnosis not present

## 2023-06-13 DIAGNOSIS — E1122 Type 2 diabetes mellitus with diabetic chronic kidney disease: Secondary | ICD-10-CM | POA: Diagnosis not present

## 2023-06-19 ENCOUNTER — Other Ambulatory Visit: Payer: Self-pay | Admitting: Cardiology

## 2023-06-23 ENCOUNTER — Other Ambulatory Visit: Payer: Self-pay | Admitting: Cardiology

## 2023-06-23 NOTE — Telephone Encounter (Signed)
 Pt last saw Dr Amanda Jungling on 08/29/22, last labs 08/16/22 Creat 0.78, age 68, weight 106.3kg, CrCl 115.84, based on CrCl pt is on appropriate dosage of Xarelto  20mg  every day for afib.  Will refill rx.

## 2023-07-01 ENCOUNTER — Encounter (INDEPENDENT_AMBULATORY_CARE_PROVIDER_SITE_OTHER): Payer: Self-pay

## 2023-07-23 ENCOUNTER — Other Ambulatory Visit: Payer: Self-pay | Admitting: Cardiology

## 2023-07-30 ENCOUNTER — Ambulatory Visit: Admitting: Internal Medicine

## 2023-08-04 IMAGING — DX DG CHEST 1V PORT
1 series · 1 of 1 positions shown · non-contrast
Comparison: 04/25/2019

CLINICAL DATA: Cough and weakness.

EXAM:
PORTABLE CHEST 1 VIEW

[chest ap]
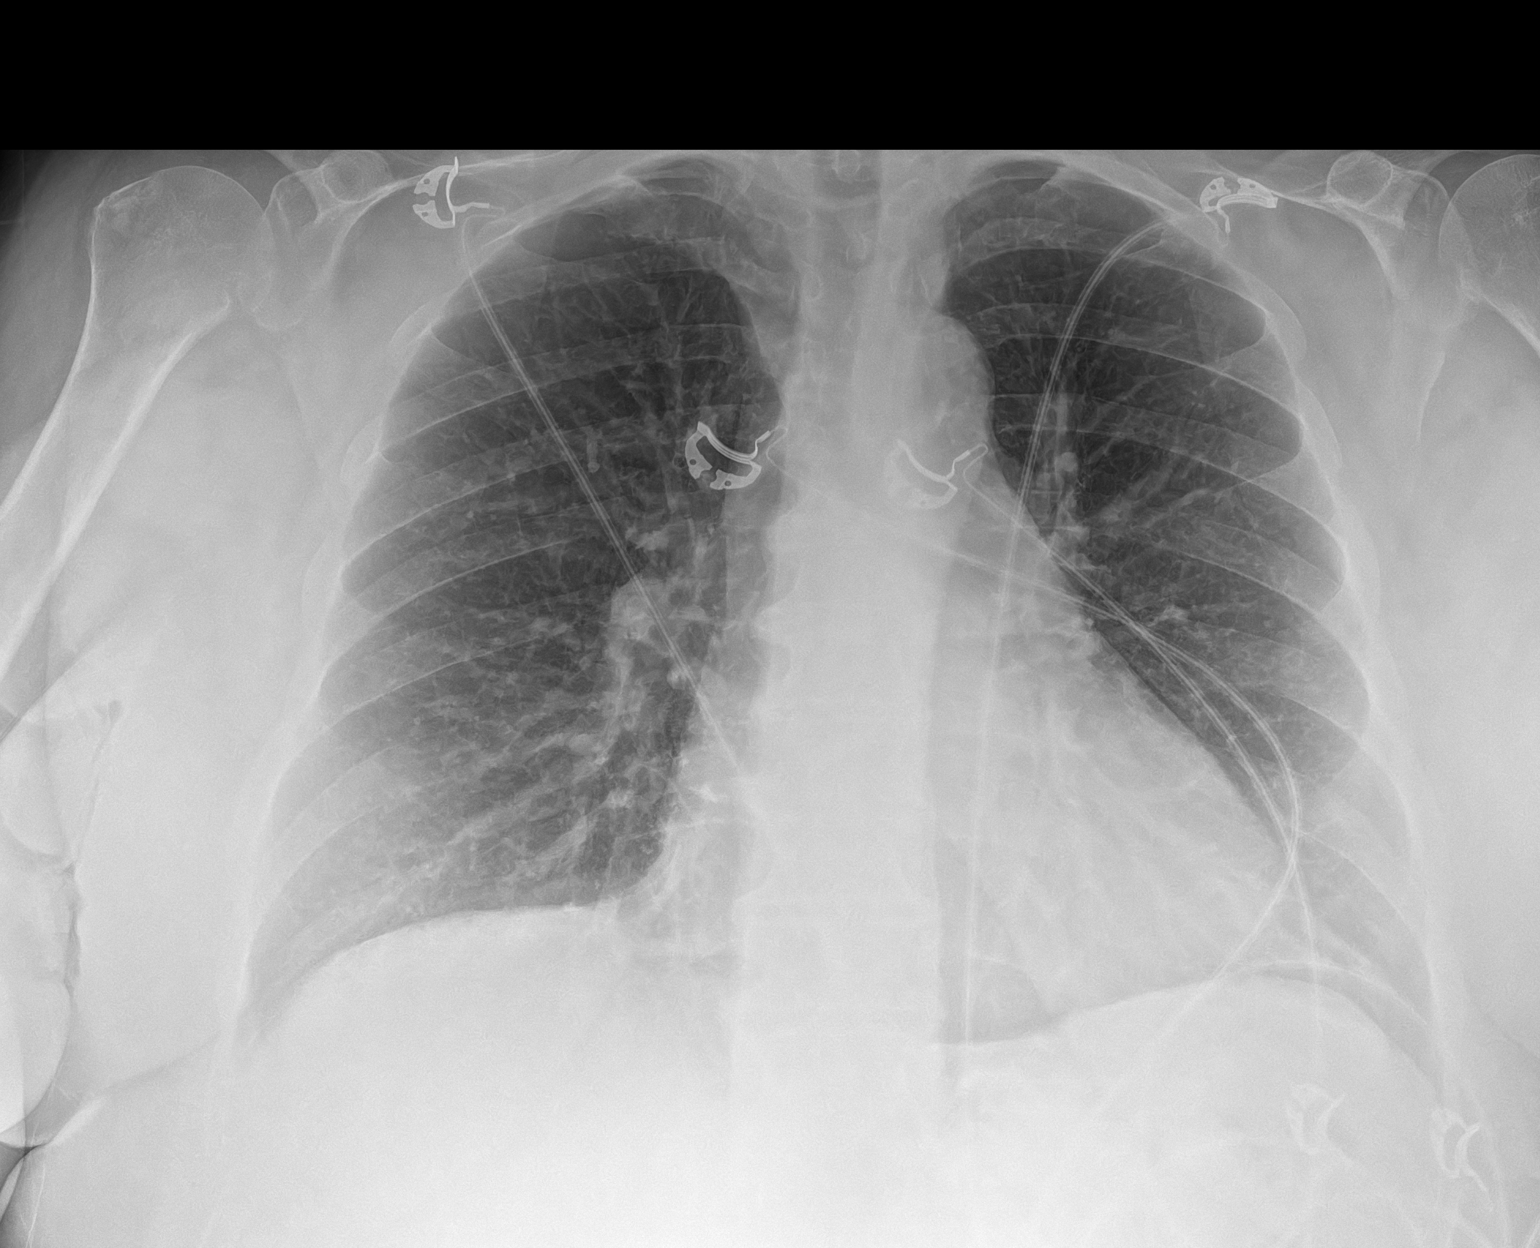

[1 of 1 positions shown; findings below may reference images not displayed]

FINDINGS: Both lungs are clear. Surgical plate in the lower cervical spine.
Heart size is within normal limits and stable. Bridging osteophytes
in the thoracic spine.
IMPRESSION: No active disease.

## 2023-08-10 NOTE — Progress Notes (Unsigned)
 Amy Tran, female    DOB: 04/24/1955    MRN: [id number]   Brief patient profile:  67   yowf  active smoker  referred to pulmonary clinic in [place]  10/10/2022 by Dr [name] for doe   PFT 04/19/16 >> FEV1 2.40 (94%), Ratio 0.77, TLC 5.34 (105%), DLCO 78%, RV 2.70 (134%), ERV 5% wt wt 289  FeNO 06/03/16 >> 11   History of Present Illness  10/10/2022  Pulmonary/ 1st office eval/ [name] / [name] Office  Chief Complaint  Patient presents with   Establish Care   Asthma    [name] 2018  Dyspnea:  MMRC2 = can't walk a nl pace on a flat grade s sob but does fine slow and flat  - problem is also legs hurt/ back pain and numbness / [employer] parking  Cough: am congestion x sev min  Sleep: flat bed/ 2 pillows/s resp cc as long as stays off back due to pnds  SABA use: up to 2 -3 x per day helps to take it before ex  02: none  Lung cancer screen: referred  Rec The key is to stop smoking completely before smoking completely stops you!  My office will be contacting you by phone for referral to lung cancer screening program [phone])  > not done as of 01/22/2023    Plan A = Automatic = Always=   Symbicort  80 Take 2 puffs first thing in am and then optional  2 puffs about 12 hours later.  Wrk on inhaler technique:    Plan B = Backup (to supplement plan A, not to replace it) Only use your albuterol  inhaler as a rescue medication Also  Ok to try albuterol  x 2 puffs 15 min before an activity (on alternating days)  that you know would usually make you short of breath  For drainage / throat tickle try take CHLORPHENIRAMINE  4 mg   Please remember to go to the  x-ray department  > ok      Allergy screen 10/10/2022 >  Eos 0.2 /  IgE  167   01/22/2023  3 mf/u ov/[name] office/[name] re: COPD 0/AB   maint on symbicort  80 prn also fosamax q Sunday    Chief Complaint  Patient presents with   Asthma   Cough  Dyspnea:  limited more by R foot burning  post a back surgery > going for TENS  next  but no limiting doe and can't see any benefit from symbicort   Cough: none now but bad with fall =  spring assoc with  pnds  Sleeping: bed blocks/ on side 2 pillows s resp cc  SABA use: none  Lung cancer screening: referred again 01/22/2023  Rec Try taking symbicort  80 x 2 puffs on alternate days until you use your present inhaler up and if you can't convince yourself you are better, stop it  For itching / sneezing / nasal congestion > try zyrtec  10 mg before bed  My office will be contacting you by phone for referral to lung cancer screening clinic and  PFTs  [phone]>> not done as  of 08/12/2023    The key is to stop smoking completely before smoking completely stops you!   08/12/2023  f/u ov/[place] office/[name] re:  GOLD 0  /AB  maint on no rx   Chief Complaint  Patient presents with   Follow-up   Asthma    Pft f/u   Dyspnea:   .Not limited by breathing from desired  activities   Cough: none Sleeping: bed blocks/ sleeps ok  s  resp cc  SABA use: none     Lung cancer screening: referred    No obvious day to day or daytime variability or assoc excess/ purulent sputum or mucus plugs or hemoptysis or cp or chest tightness, subjective wheeze or overt sinus or hb symptoms.    Also denies any obvious fluctuation of symptoms with weather or environmental changes or other aggravating or alleviating factors except as outlined above   No unusual exposure hx or h/o childhood pna/ asthma or knowledge of premature birth.  Current Allergies, Complete Past Medical History, Past Surgical History, Family History, and Social History were reviewed in [employer] record.  ROS  The following are not active complaints unless bolded Hoarseness, sore throat, dysphagia, dental problems, itching, sneezing,  nasal congestion or discharge of excess mucus or purulent secretions, ear ache,   fever, chills, sweats, unintended wt loss or wt gain, classically pleuritic or  exertional cp,  orthopnea pnd or arm/hand swelling  or leg swelling, presyncope, palpitations, abdominal pain, anorexia, nausea, vomiting, diarrhea  or change in bowel habits or change in bladder habits, change in stools or change in urine, dysuria, hematuria,  rash, arthralgias, visual complaints, headache, numbness, weakness or ataxia or problems with walking or coordination,  change in mood or  memory.        Current Meds  Medication Sig   atorvastatin  (LIPITOR ) 80 MG tablet TAKE ONE TABLET (80MG  TOTAL) BY MOUTH DAILY AT 5PM   Cholecalciferol  (VITAMIN D ) 50 MCG (2000 UT) CAPS Take 2,000 Units by mouth daily.   clopidogrel  (PLAVIX ) 75 MG tablet TAKE ONE TABLET (75MG  TOTAL) BY MOUTH DAILY AT 9AM   cyanocobalamin  (VITAMIN B12) 500 MCG tablet Take 500 mcg by mouth daily.   dofetilide  (TIKOSYN ) 250 MCG capsule TAKE ONE CAPSULE (250 MCG TOTAL) BY MOUTH TWICE DAILY @9AM -5PM   fluticasone  (FLONASE ) 50 MCG/ACT nasal spray Place 2 sprays into both nostrils daily as needed for rhinitis (congestion/ dryness).   glipiZIDE  (GLUCOTROL  XL) 5 MG 24 hr tablet Take 5 mg by mouth every morning.   JARDIANCE  10 MG TABS tablet Take 10 mg by mouth daily.   ketoconazole  (NIZORAL ) 2 % cream Apply 1 Application topically 2 (two) times daily.   Lancets (ONETOUCH DELICA PLUS LANCET33G) MISC 1 each daily.   losartan  (COZAAR ) 100 MG tablet Take 100 mg by mouth daily with breakfast.    MOUNJARO 10 MG/0.5ML Pen Inject 10 mg into the skin once a week.   nitroGLYCERIN  (NITROSTAT ) 0.4 MG SL tablet Place 1 tablet (0.4 mg total) under the tongue every 5 (five) minutes as needed for chest pain.   potassium chloride  SA (KLOR-CON ) 20 MEQ tablet Take 1 tablet (20 mEq total) by mouth 2 (two) times daily. (Patient taking differently: Take 20 mEq by mouth in the morning, at noon, and at bedtime.)   rivaroxaban  (XARELTO ) 20 MG TABS tablet TAKE ONE TABLET (20 MG TOTAL) BY MOUTH DAILY AT 5PM WITH SUPPER   Skin Protectants, Misc. (EUCERIN)  cream Apply 1 Application topically daily.   spironolactone  (ALDACTONE ) 25 MG tablet Take 25 mg by mouth daily with breakfast.    torsemide  (DEMADEX ) 20 MG tablet Take 60 mg by mouth daily after breakfast.               Past Medical History:  Diagnosis Date   Aortic stenosis    Arthritis    Asthma  Cancer [facility])    multiple skin cancers   Coronary artery disease    Diabetes mellitus without complication (HCC)    Diverticulosis    Dysrhythmia    Fluid retention    GERD (gastroesophageal reflux disease)    High cholesterol    Hyperlipidemia    Hypertension    Incomplete rotator cuff tear    Myocardial infarction (HCC)    Obesity    Osteoporosis    PAD (peripheral artery disease) (HCC)    Persistent atrial fibrillation (HCC)    PONV (postoperative nausea and vomiting)    Wears glasses    Wears partial dentures    bottom      Objective:     wts  08/12/2023        215  01/22/2023     251   10/10/22 265 lb (120.2 kg)  08/29/22 268 lb (121.6 kg)  08/26/22 268 lb 15.4 oz (122 kg)       Vital signs reviewed  08/12/2023  - Note at rest 02 sats  95% on RA   General appearance:    amb mod obese (by BMI) wf  nad   HEENT : Oropharynx  clear/ edentulous/ mild pseudowheeze on FVC)        NECK :  without  apparent JVD/ palpable Nodes/TM    LUNGS: no acc muscle use,  Nl contour chest which is clear to A and P bilaterally without cough on insp or exp maneuvers   CV:  RRR  no s3 or murmur or increase in P2, and bilaeral lymhedema  ABD:  soft and nontender   MS:  Gait nl  ext warm without deformities Or obvious joint restrictions  calf tenderness, cyanosis or clubbing    SKIN: warm and dry without lesions    NEURO:  alert, approp, nl sensorium with  no motor or cerebellar deficits apparent.           Assessment

## 2023-08-12 ENCOUNTER — Encounter: Payer: Self-pay | Admitting: Internal Medicine

## 2023-08-12 ENCOUNTER — Ambulatory Visit (INDEPENDENT_AMBULATORY_CARE_PROVIDER_SITE_OTHER): Admitting: Internal Medicine

## 2023-08-12 VITALS — BP 84/47 | HR 47 | Ht 64.0 in | Wt 215.2 lb

## 2023-08-12 DIAGNOSIS — F17210 Nicotine dependence, cigarettes, uncomplicated: Secondary | ICD-10-CM | POA: Diagnosis not present

## 2023-08-12 DIAGNOSIS — J4489 Other specified chronic obstructive pulmonary disease: Secondary | ICD-10-CM | POA: Diagnosis not present

## 2023-08-12 NOTE — Patient Instructions (Signed)
 My office will be contacting you by phone for referral to lung cancer screening   [phone]) - if you don't hear back from my office within one week,  please call us  back or notify us  thru MyChart and we'll address it right away.      Pulmoary follow up in this office is as needed

## 2023-08-12 NOTE — Assessment & Plan Note (Addendum)
 Active smoker/ symptoms since 2018 PFT 04/19/16 >> FEV1 2.40 (94%), Ratio 0.77, TLC 5.34 (105%), DLCO 78%, RV 2.70 (134%), ERV 5% wt wt 289  FeNO 06/03/16 >> 11 Allergy screen 10/10/2022 >  Eos 0.2 /  IgE  167 - 10/10/2022   try symb 80 2bid prn  - 01/22/2023  After extensive coaching inhaler device,  effectiveness =    80% try symbicort  80 every other day for a month then try off. - PFT's  04/15/23 wnl  and FV curve nl  and ERV 53%  at wt 230    No worse off symbicort , no need for saba but still smoking   Rec saba prn / f/u prn    Each maintenance medication was reviewed in detail including emphasizing most importantly the difference between maintenance and prns and under what circumstances the prns are to be triggered using an action plan format where appropriate.  Total time for H and P, chart review, counseling, reviewing hfa   device(s) and generating customized AVS unique to this office visit / same day charting = 20 min          Each maintenance medication was reviewed in detail including emphasizing most importantly the difference between maintenance and prns and under what circumstances the prns are to be triggered using an action plan format where appropriate.  Total time for H and P, chart review, counseling, reviewing hfa  device(s) and generating customized AVS unique to this office visit / same day charting = 20 min

## 2023-08-12 NOTE — Assessment & Plan Note (Addendum)
 4-5 min discussion re active cigarette smoking in addition to office E&M  Ask about tobacco use:   ongoing  Advise quitting   I took an extended  opportunity with this patient to outline the consequences of continued cigarette use  in airway disorders based on all the data we have from the multiple national lung health studies (perfomed over decades at millions of dollars in cost)  indicating that smoking cessation, not choice of inhalers or pulmonary physicians, is the most important aspect of her  care.   Assess willingness:  Not committed at this point Assist in quit attempt:  Per PCP when ready    Referred for LDSCT 08/12/2023 >>>   Discussed in detail all the  indications, usual  risks and alternatives  relative to the benefits with patient who agrees to proceed with w/u as outlined.

## 2023-08-20 ENCOUNTER — Telehealth: Payer: Self-pay

## 2023-08-20 DIAGNOSIS — Z122 Encounter for screening for malignant neoplasm of respiratory organs: Secondary | ICD-10-CM

## 2023-08-20 DIAGNOSIS — F17210 Nicotine dependence, cigarettes, uncomplicated: Secondary | ICD-10-CM

## 2023-08-20 DIAGNOSIS — Z87891 Personal history of nicotine dependence: Secondary | ICD-10-CM

## 2023-08-20 NOTE — Telephone Encounter (Signed)
 Lung Cancer Screening Narrative/Criteria Questionnaire (Cigarette Smokers Only- No Cigars/Pipes/vapes)   Amy Tran   SDMV:08/22/2023 at 2:00 pm with [name]   1955/08/05   LDCT: 08/27/2023 at 5:30 pm AP    68 y.o.   Phone: [phone]  Lung Screening Narrative (confirm age 95-77 yrs Medicare / 50-80 yrs Private pay insurance)   Insurance information: [facility] Medicare   Referring Provider: Sheryle   This screening involves an initial phone call with a team member from our program. It is called a shared decision making visit. The initial meeting is required by  insurance and Medicare to make sure you understand the program. This appointment takes about 15-20 minutes to complete. You will complete the screening scan at your scheduled date/time.  This scan takes about 5-10 minutes to complete. You can eat and drink normally before and after the scan.  Criteria questions for Lung Cancer Screening:   Are you a current or former smoker? Current Age began smoking: 36   If you are a former smoker, what year did you quit smoking? Quit for 16 years but has now started back. (within 15 yrs)   To calculate your smoking history, I need an accurate estimate of how many packs of cigarettes you smoked per day and for how many years. (Not just the number of PPD you are now smoking)   Years smoking 34 x Packs per day 2.5 = Pack years 85   (at least 20 pack yrs)   (Make sure they understand that we need to know how much they have smoked in the past, not just the number of PPD they are smoking now)  Do you have a personal history of cancer?  Yes - (type and when diagnosed - 5 yrs cancer free) Skin Cancer    Do you have a family history of cancer? Yes  (cancer type and and relative) Daughter Lymphoma  Are you coughing up blood?  No  Have you had unexplained weight loss of 15 lbs or more in the last 6 months? No  It looks like you meet all criteria.  When would be a good time for us  to schedule you  for this screening?   Additional information: N/A

## 2023-08-22 ENCOUNTER — Ambulatory Visit: Admitting: *Deleted

## 2023-08-22 ENCOUNTER — Encounter: Payer: Self-pay | Admitting: *Deleted

## 2023-08-22 DIAGNOSIS — F17210 Nicotine dependence, cigarettes, uncomplicated: Secondary | ICD-10-CM

## 2023-08-22 NOTE — Patient Instructions (Signed)

## 2023-08-22 NOTE — Progress Notes (Signed)
 Virtual Visit via Telephone Note  I connected with Amy Tran on 08/22/23 at  2:00 PM EDT by telephone and verified that I am speaking with the correct person using two identifiers.  Location: Patient: in home Provider: [address], [place], [place], [address]   Shared Decision Making Visit Lung Cancer Screening Program [phone])   Eligibility: Age 68 y.o.l Pack Years Smoking History Calculation 4 (# packs/per year x # years smoked) Recent History of coughing up blood  no Unexplained weight loss? no ( >Than 15 pounds within the last 6 months ) Prior History Lung / other cancer no (had Skin CA )diagnosis within the last 5 years already requiring surveillance chest CT Scans). Smoking Status Current Smoker Former Smokers: Years since quit:  NA  Quit Date: NA  Visit Components: Discussion included one or more decision making aids. yes Discussion included risk/benefits of screening. yes Discussion included potential follow up diagnostic testing for abnormal scans. yes Discussion included meaning and risk of over diagnosis. yes Discussion included meaning and risk of False Positives. yes Discussion included meaning of total radiation exposure. yes  Counseling Included: Importance of adherence to annual lung cancer LDCT screening. yes Impact of comorbidities on ability to participate in the program. yes Ability and willingness to under diagnostic treatment. yes  Smoking Cessation Counseling: Current Smokers:  Discussed importance of smoking cessation. yes Information about tobacco cessation classes and interventions provided to patient. yes Patient provided with ticket for LDCT Scan. yes Symptomatic Patient. no  Counselingm NA Diagnosis Code: Tobacco Use Z72.0 Asymptomatic Patient yes  Counseling (Intermediate counseling: > three minutes counseling) H9563 Former Smokers:  Discussed the importance of maintaining cigarette abstinence. yes Diagnosis Code:  Personal History of Nicotine  Dependence. S12.108 Information about tobacco cessation classes and interventions provided to patient. Yes Patient provided with ticket for LDCT Scan. yes Written Order for Lung Cancer Screening with LDCT placed in Epic. Yes (CT Chest Lung Cancer Screening Low Dose W/O CM) [id number] Z12.2-Screening of respiratory organs Z87.891-Personal history of nicotine  dependence   [name] Ranger, RN 08/22/23

## 2023-08-26 ENCOUNTER — Other Ambulatory Visit: Payer: Self-pay | Admitting: Cardiology

## 2023-08-27 ENCOUNTER — Ambulatory Visit (HOSPITAL_COMMUNITY)

## 2023-08-29 ENCOUNTER — Ambulatory Visit (HOSPITAL_COMMUNITY)
Admission: RE | Admit: 2023-08-29 | Discharge: 2023-08-29 | Disposition: A | Discharge status: Home / Self Care | Source: Ambulatory Visit | Attending: Acute Care | Admitting: Acute Care

## 2023-08-29 DIAGNOSIS — Z87891 Personal history of nicotine dependence: Secondary | ICD-10-CM | POA: Diagnosis not present

## 2023-08-29 DIAGNOSIS — Z122 Encounter for screening for malignant neoplasm of respiratory organs: Secondary | ICD-10-CM | POA: Insufficient documentation

## 2023-08-29 DIAGNOSIS — F17210 Nicotine dependence, cigarettes, uncomplicated: Secondary | ICD-10-CM | POA: Diagnosis not present

## 2023-09-03 DIAGNOSIS — Z79899 Other long term (current) drug therapy: Secondary | ICD-10-CM | POA: Diagnosis not present

## 2023-09-03 DIAGNOSIS — I10 Essential (primary) hypertension: Secondary | ICD-10-CM | POA: Diagnosis not present

## 2023-09-03 DIAGNOSIS — D649 Anemia, unspecified: Secondary | ICD-10-CM | POA: Diagnosis not present

## 2023-09-03 DIAGNOSIS — E1129 Type 2 diabetes mellitus with other diabetic kidney complication: Secondary | ICD-10-CM | POA: Diagnosis not present

## 2023-09-03 DIAGNOSIS — E785 Hyperlipidemia, unspecified: Secondary | ICD-10-CM | POA: Diagnosis not present

## 2023-09-04 NOTE — Progress Notes (Unsigned)
 Cardiology Office Note    Date:  09/05/2023  ID:  ZI SEK, DOB 1955-03-01, MRN [id number] Cardiologist: [name] Carrier, MD Cardiology APP:  Johnson Laymon HERO, PA-C { : History of Present Illness:    Amy Tran is a 68 y.o. female with past medical history of CAD (s/p NSTEMI in 04/2019 with DES to RCA), aortic stenosis, paroxysmal atrial fibrillation, PAD (prior mechanical thrombectomy and stenting of the left popliteal artery in 09/2020), lymphedema, HTN and HLD who presents to the office today for overdue 59-month follow-up.  She was examined by Dr. [name] in 08/2022 and reported having baseline dyspnea on exertion which would improve with the use of her inhaler and denied any recent chest pain. She did have lower extremity edema which was felt to be due to obesity and lymphedema and was encouraged to use her lymphedema pump. No changes were made to her cardiac medications and she was continued on Atorvastatin  80 mg daily, Plavix  75 mg daily (on this per Vascular), Tikosyn  250 mcg twice daily, Jardiance  10 mg daily, Losartan  100 mg daily, Xarelto  20 mg daily, Spironolactone  25 mg daily and Torsemide  60 mg daily.  In talking with the patient today, she reports staying active at baseline as she works at a [employer] as a Conservation officer, nature for 4 hours a day and then performs routine chores around her home. Says she was previously going to the gym many years ago but has not done so recently. Activity is somewhat limited given her history of sciatica and leg pain. She does have a stimulator in place. Reports having lost over 120 pounds since being on Mounjaro. She denies any specific chest pain or dyspnea on exertion. No recent palpitations, orthopnea or PND. She has chronic lymphedema which has overall been stable with her current diuretic regimen.  Studies Reviewed:   EKG: EKG is ordered today and demonstrates:   EKG Interpretation Date/Time:  Friday September 05 2023 14:13:46  EDT Ventricular Rate:  50 PR Interval:    QRS Duration:  68 QT Interval:  462 QTC Calculation: 421 R Axis:   55  Text Interpretation: Sinus bradycardia Low voltage QRS Artifact due to spinal stimulator Confirmed by Johnson Laymon ([id number]) on 09/05/2023 2:19:12 PM       Echocardiogram: 09/2021 IMPRESSIONS     1. Left ventricular ejection fraction, by estimation, is 60 to 65%. The  left ventricle has normal function. The left ventricle has no regional  wall motion abnormalities. Left ventricular diastolic parameters are  indeterminate. The average left  ventricular global longitudinal strain is -24.0 %. The global longitudinal  strain is normal.   2. Right ventricular systolic function is normal. The right ventricular  size is normal.   3. The mitral valve is abnormal. Mild mitral valve regurgitation. No  evidence of mitral stenosis.   4. The aortic valve is tricuspid. There is mild calcification of the  aortic valve. There is mild thickening of the aortic valve. Aortic valve  regurgitation is trivial. Mild to moderate aortic valve stenosis. Aortic  valve mean gradient measures 15.0  mmHg. Aortic valve peak gradient measures 26.4 mmHg. Aortic valve area, by  VTI measures 1.23 cm.   5. There is mild dilatation of the ascending aorta, measuring 36 mm.   6. The inferior vena cava is normal in size with greater than 50%  respiratory variability, suggesting right atrial pressure of 3 mmHg.   Comparison(s): Echocardiogram done 04/28/19 showed an EF of 58% with mild  AS and an AV Mean Grad of 7 mmHg.    Risk Assessment/Calculations:    CHA2DS2-VASc Score = 5  This indicates a 7.2% annual risk of stroke. The patient's score is based upon: CHF History: 0 HTN History: 1 Diabetes History: 1 Stroke History: 0 Vascular Disease History: 1 Age Score: 1 Gender Score: 1    Physical Exam:   VS:  BP 110/62 (BP Location: Left Arm, Cuff Size: Large)   Pulse 60   Ht 5' 4 (1.626  m)   Wt 213 lb 3.2 oz (96.7 kg)   SpO2 95%   BMI 36.60 kg/m    Wt Readings from Last 3 Encounters:  09/05/23 213 lb 3.2 oz (96.7 kg)  08/12/23 215 lb 3.2 oz (97.6 kg)  04/11/23 234 lb 6.4 oz (106.3 kg)     GEN: Well nourished, well developed female appearing in no acute distress NECK: No JVD; No carotid bruits CARDIAC: Regular rhythm, bradycardiac rate. 2/6 SEM along RUSB.  RESPIRATORY:  Clear to auscultation without rales, wheezing or rhonchi  ABDOMEN: Appears non-distended. No obvious abdominal masses. EXTREMITIES: No clubbing or cyanosis. Chronic appearing lymphedema. Distal pulses 2+ bilaterally.    Assessment and Plan:   1. Coronary artery disease involving native coronary artery of native heart without angina pectoris - She did have an NSTEMI in 04/2019 with DES to RCA. She denies any recent anginal symptoms. We reviewed ways to try to gradually increase her aerobic activity. - Continue Atorvastatin  80 mg daily and Plavix  75 mg daily (on this per Vascular for prior lower extremity intervention). No beta-blocker given baseline bradycardia.   2. PAF (paroxysmal atrial fibrillation) (HCC) - She denies any recent palpitations and is in normal sinus rhythm by examination and EKG today. She has not been on AV nodal blocking agents due to baseline bradycardia. Remains on Tikosyn  250 mcg twice daily. QTc difficult to assess on EKG today given her spinal stimulator but manually calculated at approximately 438 ms.  - She is on Xarelto  20 mg daily for anticoagulation. This is the correct dose given her calculated CrCl of 90 mL/min. Recent labs earlier this month showed Hgb was at 12.8 with platelets at 315 K.   3. Aortic valve stenosis, etiology of cardiac valve disease unspecified - This was mild to moderate by echocardiogram in 09/2021. She does have a prominent murmur on examination today and will recheck an echocardiogram given the timeframe since last evaluation.  4. Essential  hypertension - BP is well-controlled at 110/62 during today's visit. Does report one episode of hypotension but no known recurrence. Continue current medical therapy for now with Losartan  100 mg daily, Spironolactone  25 mg daily and Torsemide  60 mg daily. If BP remains soft over time, can reduce Losartan  to 50 mg daily as BP has improved with her continued weight loss.   5. Hyperlipidemia LDL goal <70 - LDL was at 46 when checked earlier this month by review of [facility] DXA. Continue Atorvastatin  80 mg daily.  6. Lymphedema - Followed by Vascular Surgery. She remains on Spironolactone  25 mg daily and Torsemide  60 mg daily.  Creatinine was stable at 0.91 by recent labs.  Signed, Laymon CHRISTELLA Qua, PA-C

## 2023-09-05 ENCOUNTER — Encounter: Payer: Self-pay | Admitting: Student

## 2023-09-05 ENCOUNTER — Ambulatory Visit: Attending: Student | Admitting: Student

## 2023-09-05 VITALS — BP 110/62 | HR 60 | Ht 64.0 in | Wt 213.2 lb

## 2023-09-05 DIAGNOSIS — I480 Paroxysmal atrial fibrillation: Secondary | ICD-10-CM | POA: Diagnosis not present

## 2023-09-05 DIAGNOSIS — I10 Essential (primary) hypertension: Secondary | ICD-10-CM

## 2023-09-05 DIAGNOSIS — I350 Nonrheumatic aortic (valve) stenosis: Secondary | ICD-10-CM

## 2023-09-05 DIAGNOSIS — E785 Hyperlipidemia, unspecified: Secondary | ICD-10-CM | POA: Diagnosis not present

## 2023-09-05 DIAGNOSIS — I890 Lymphedema, not elsewhere classified: Secondary | ICD-10-CM | POA: Diagnosis not present

## 2023-09-05 DIAGNOSIS — I2510 Atherosclerotic heart disease of native coronary artery without angina pectoris: Secondary | ICD-10-CM

## 2023-09-05 MED ORDER — CLOPIDOGREL BISULFATE 75 MG PO TABS: 75.0000 mg | ORAL_TABLET | Freq: Every day | ORAL | 1 refills | Status: DC

## 2023-09-05 MED ORDER — RIVAROXABAN 20 MG PO TABS: 20.0000 mg | ORAL_TABLET | Freq: Every day | ORAL | 3 refills | Status: AC

## 2023-09-05 MED ORDER — DOFETILIDE 250 MCG PO CAPS: 250.0000 ug | ORAL_CAPSULE | Freq: Two times a day (BID) | ORAL | 3 refills | Status: DC

## 2023-09-05 MED ORDER — ATORVASTATIN CALCIUM 80 MG PO TABS: 80.0000 mg | ORAL_TABLET | Freq: Every day | ORAL | 3 refills | Status: AC

## 2023-09-05 NOTE — Patient Instructions (Addendum)
 Medication Instructions:  Your physician recommends that you continue on your current medications as directed. Please refer to the Current Medication list given to you today.  *If you need a refill on your cardiac medications before your next appointment, please call your pharmacy*  Lab Work: NONE   If you have labs (blood work) drawn today and your tests are completely normal, you will receive your results only by: MyChart Message (if you have MyChart) OR A paper copy in the mail If you have any lab test that is abnormal or we need to change your treatment, we will call you to review the results.  Testing/Procedures: Your physician has requested that you have an echocardiogram. Echocardiography is a painless test that uses sound waves to create images of your heart. It provides your doctor with information about the size and shape of your heart and how well your heart's chambers and valves are working. This procedure takes approximately one hour. There are no restrictions for this procedure. Please do NOT wear cologne, perfume, aftershave, or lotions (deodorant is allowed). Please arrive 15 minutes prior to your appointment time.  Please note: We ask at that you not bring children with you during ultrasound (echo/ vascular) testing. Due to room size and safety concerns, children are not allowed in the ultrasound rooms during exams. Our front office staff cannot provide observation of children in our lobby area while testing is being conducted. An adult accompanying a patient to their appointment will only be allowed in the ultrasound room at the discretion of the ultrasound technician under special circumstances. We apologize for any inconvenience.   Follow-Up: At [facility], you and your health needs are our priority.  As part of our continuing mission to provide you with exceptional heart care, our providers are all part of one team.  This team includes your primary Cardiologist  (physician) and Advanced Practice Providers or APPs (Physician Assistants and Nurse Practitioners) who all work together to provide you with the care you need, when you need it.  Your next appointment:   6 month(s)  Provider:   You may see Armida Lander, MD or one of the following Advanced Practice Providers on your designated Care Team:   Woodfin Hays, PA-C  [place], [place] Theotis Flake, [place]     We recommend signing up for the patient portal called "MyChart".  Sign up information is provided on this After Visit Summary.  MyChart is used to connect with patients for Virtual Visits (Telemedicine).  Patients are able to view lab/test results, encounter notes, upcoming appointments, etc.  Non-urgent messages can be sent to your provider as well.   To learn more about what you can do with MyChart, go to [email or web address].   Other Instructions Thank you for choosing [facility]!

## 2023-09-06 ENCOUNTER — Encounter (HOSPITAL_COMMUNITY): Payer: Self-pay

## 2023-09-06 ENCOUNTER — Emergency Department (HOSPITAL_COMMUNITY)
Admission: EM | Admit: 2023-09-06 | Discharge: 2023-09-06 | Disposition: A | Discharge status: Home / Self Care | Attending: Emergency Medicine | Admitting: Emergency Medicine

## 2023-09-06 ENCOUNTER — Other Ambulatory Visit: Payer: Self-pay

## 2023-09-06 DIAGNOSIS — I10 Essential (primary) hypertension: Secondary | ICD-10-CM | POA: Diagnosis not present

## 2023-09-06 DIAGNOSIS — M79662 Pain in left lower leg: Secondary | ICD-10-CM | POA: Diagnosis present

## 2023-09-06 DIAGNOSIS — Z7984 Long term (current) use of oral hypoglycemic drugs: Secondary | ICD-10-CM | POA: Insufficient documentation

## 2023-09-06 DIAGNOSIS — J45909 Unspecified asthma, uncomplicated: Secondary | ICD-10-CM | POA: Diagnosis not present

## 2023-09-06 DIAGNOSIS — Z7902 Long term (current) use of antithrombotics/antiplatelets: Secondary | ICD-10-CM | POA: Insufficient documentation

## 2023-09-06 DIAGNOSIS — Z7951 Long term (current) use of inhaled steroids: Secondary | ICD-10-CM | POA: Insufficient documentation

## 2023-09-06 DIAGNOSIS — Z79899 Other long term (current) drug therapy: Secondary | ICD-10-CM | POA: Diagnosis not present

## 2023-09-06 DIAGNOSIS — I2510 Atherosclerotic heart disease of native coronary artery without angina pectoris: Secondary | ICD-10-CM | POA: Diagnosis not present

## 2023-09-06 DIAGNOSIS — Z85828 Personal history of other malignant neoplasm of skin: Secondary | ICD-10-CM | POA: Insufficient documentation

## 2023-09-06 DIAGNOSIS — E119 Type 2 diabetes mellitus without complications: Secondary | ICD-10-CM | POA: Diagnosis not present

## 2023-09-06 DIAGNOSIS — L03116 Cellulitis of left lower limb: Secondary | ICD-10-CM | POA: Diagnosis not present

## 2023-09-06 LAB — CBC WITH DIFFERENTIAL/PLATELET
Abs Immature Granulocytes: 0.1 K/uL — ABNORMAL HIGH (ref 0.00–0.07)
Basophils Absolute: 0 K/uL (ref 0.0–0.1)
Basophils Relative: 0 %
Eosinophils Absolute: 0 K/uL (ref 0.0–0.5)
Eosinophils Relative: 0 %
HCT: 35.1 % — ABNORMAL LOW (ref 36.0–46.0)
Hemoglobin: 11.7 g/dL — ABNORMAL LOW (ref 12.0–15.0)
Immature Granulocytes: 1 %
Lymphocytes Relative: 5 %
Lymphs Abs: 1 K/uL (ref 0.7–4.0)
MCH: 30.5 pg (ref 26.0–34.0)
MCHC: 33.3 g/dL (ref 30.0–36.0)
MCV: 91.6 fL (ref 80.0–100.0)
Monocytes Absolute: 1.3 K/uL — ABNORMAL HIGH (ref 0.1–1.0)
Monocytes Relative: 6 %
Neutro Abs: 18.3 K/uL — ABNORMAL HIGH (ref 1.7–7.7)
Neutrophils Relative %: 88 %
Platelets: 282 K/uL (ref 150–400)
RBC: 3.83 MIL/uL — ABNORMAL LOW (ref 3.87–5.11)
RDW: 14.9 % (ref 11.5–15.5)
WBC: 20.7 K/uL — ABNORMAL HIGH (ref 4.0–10.5)
nRBC: 0 % (ref 0.0–0.2)

## 2023-09-06 LAB — COMPREHENSIVE METABOLIC PANEL WITH GFR
ALT: 14 U/L (ref 0–44)
AST: 18 U/L (ref 15–41)
Albumin: 3.9 g/dL (ref 3.5–5.0)
Alkaline Phosphatase: 45 U/L (ref 38–126)
Anion gap: 11 (ref 5–15)
BUN: 21 mg/dL (ref 8–23)
CO2: 23 mmol/L (ref 22–32)
Calcium: 9.4 mg/dL (ref 8.9–10.3)
Chloride: 98 mmol/L (ref 98–111)
Creatinine, Ser: 1.06 mg/dL — ABNORMAL HIGH (ref 0.44–1.00)
GFR, Estimated: 57 mL/min — ABNORMAL LOW (ref >60)
Glucose, Bld: 118 mg/dL — ABNORMAL HIGH (ref 70–99)
Potassium: 4.2 mmol/L (ref 3.5–5.1)
Sodium: 132 mmol/L — ABNORMAL LOW (ref 135–145)
Total Bilirubin: 1.5 mg/dL — ABNORMAL HIGH (ref 0.0–1.2)
Total Protein: 7.1 g/dL (ref 6.5–8.1)

## 2023-09-06 LAB — LACTIC ACID, PLASMA: Lactic Acid, Venous: 1.4 mmol/L (ref 0.5–1.9)

## 2023-09-06 LAB — D-DIMER, QUANTITATIVE: D-Dimer, Quant: 0.3 ug{FEU}/mL (ref 0.00–0.50)

## 2023-09-06 MED ORDER — CLINDAMYCIN HCL 150 MG PO CAPS: 450.0000 mg | ORAL_CAPSULE | Freq: Three times a day (TID) | ORAL | 0 refills | Status: DC

## 2023-09-06 MED ORDER — OXYCODONE HCL 5 MG PO TABS
5.0000 mg | ORAL_TABLET | Freq: Once | ORAL | Status: AC
  Administered 2023-09-06: 5 mg via ORAL
  Filled 2023-09-06: qty 1

## 2023-09-06 MED ORDER — CLINDAMYCIN HCL 150 MG PO CAPS
450.0000 mg | ORAL_CAPSULE | Freq: Once | ORAL | Status: AC
  Administered 2023-09-06: 450 mg via ORAL
  Filled 2023-09-06: qty 3

## 2023-09-06 NOTE — ED Provider Notes (Signed)
 [place] [facility]ERGENC[facility] DEPARTMENT AT [facility] Provider Note   CSN: [id number] Arrival date & time: 09/06/23  1211     Patient presents with: Leg Swelling   MAXENE BYINGTON is a 68 y.o. female history of diabetes, CAD, hypertension, hyperlipidemia, PAD status post stents on Plavix , A-fib on Xarelto  presents with complaints of left lower extremity redness and pain.  Symptoms started this morning.  She has no systemic symptoms including fevers or chills.  She is still able to ambulate.  There is no injury or trauma.  She reports compliance with her anticoagulation.   HPI    Past Medical History:  Diagnosis Date   Aortic stenosis    Arthritis    Asthma    Cancer (HCC)    multiple skin cancers   Coronary artery disease    Diabetes mellitus without complication (HCC)    Diverticulosis    Dysrhythmia    Fluid retention    GERD (gastroesophageal reflux disease)    High cholesterol    Hyperlipidemia    Hypertension    Incomplete rotator cuff tear    Myocardial infarction (HCC)    Obesity    Osteoporosis    PAD (peripheral artery disease) (HCC)    Persistent atrial fibrillation (HCC)    PONV (postoperative nausea and vomiting)    Wears glasses    Wears partial dentures    bottom   Past Surgical History:  Procedure Laterality Date   APPENDECTOMY     CARDIOVERSION N/A 06/03/2019   Procedure: CARDIOVERSION;  Surgeon: Charls Pearla LABOR, MD;  Location: [facility] ORS;  Service: Cardiovascular;  Laterality: N/A;   CARDIOVERSION N/A 07/13/2019   Procedure: CARDIOVERSION;  Surgeon: Barbaraann Darryle Ned, MD;  Location: [facility] ENDOSCOPY;  Service: Cardiovascular;  Laterality: N/A;   CARPAL TUNNEL RELEASE Bilateral 2001   bilateral   CERVICAL POLYPECTOMY  04/08/2017   Procedure: POLYPECTOMY;  Surgeon: Edsel Norleen GAILS, MD;  Location: [facility]S;  Service: Gynecology;;  Endometrial   CHOLECYSTECTOMY     COLONOSCOPY  2007   [id number] internal hemorrhoids. Diminutive rectal polyp at  10 cm, cold biopsied/removed. The remainder of the rectal mucosa appeared normal Swallow left-sided diverticula. Diminutive polyp at the splenic flexure cold biopsied/removed (adenomatous)   COLONOSCOPY N/A 12/28/2013   Procedure: COLONOSCOPY;  Surgeon: Lamar CHRISTELLA Hollingshead, MD;  Location: [facility] ENDO SUITE;  Service: Endoscopy;  Laterality: N/A;  730 - moved to 8:30 - Ginger notified pt   CORONARY STENT INTERVENTION N/A 04/27/2019   Procedure: CORONARY STENT INTERVENTION;  Surgeon: Burnard Ned LABOR, MD;  Location: [facility] INVASIVE CV LAB;  Service: Cardiovascular;  Laterality: N/A;   CORONARY ULTRASOUND/IVUS N/A 09/12/2017   Procedure: INTRAVASCULAR ULTRASOUND/IVUS;  Surgeon: Harvey Carlin BRAVO, MD;  Location: [facility] INVASIVE CV LAB;  Service: Cardiovascular;  Laterality: N/A;   DILATION AND CURETTAGE, DIAGNOSTIC / THERAPEUTIC  03/07/2017   ERCP  2002   GASTRIC BYPASS  2004   HYSTEROSCOPY WITH D & C N/A 04/08/2017   Procedure: DILATATION AND CURETTAGE /HYSTEROSCOPY;  Surgeon: Edsel Norleen GAILS, MD;  Location: [facility]S;  Service: Gynecology;  Laterality: N/A;   LEFT HEART CATH AND CORONARY ANGIOGRAPHY N/A 04/27/2019   Procedure: LEFT HEART CATH AND CORONARY ANGIOGRAPHY;  Surgeon: Burnard Ned LABOR, MD;  Location: [facility] INVASIVE CV LAB;  Service: Cardiovascular;  Laterality: N/A;   LOWER EXTREMITY ANGIOGRAPHY Left 10/02/2020   Procedure: LOWER EXTREMITY ANGIOGRAPHY;  Surgeon: Marea Selinda RAMAN, MD;  Location: [facility] INVASIVE CV LAB;  Service: Cardiovascular;  Laterality:  Left;   LUMBAR LAMINECTOMY/DECOMPRESSION MICRODISCECTOMY Right 03/15/2022   Procedure: Microdiscectomy - right - Lumbar Four-Lumbar Five extraforaminal;  Surgeon: Louis Shove, MD;  Location: [facility] OR;  Service: Neurosurgery;  Laterality: Right;   NECK SURGERY  1998   fusion   PERCUTANEOUS VENOUS THROMBECTOMY,LYSIS WITH INTRAVASCULAR ULTRASOUND (IVUS) Left 09/13/2017   Procedure: REMOVAL OF LYSIS CATHETER AND INTRAVASCULAR ULTRASOUND (IVUS) LEFT ILIAC VEIN;  Surgeon: Harvey Carlin BRAVO, MD;  Location: [facility] OR;  Service: Vascular;  Laterality: Left;   PERIPHERAL VASCULAR INTERVENTION  09/15/2017   Procedure: PERIPHERAL VASCULAR INTERVENTION;  Surgeon: Sheree Penne Bruckner, MD;  Location: [facility] INVASIVE CV LAB;  Service: Cardiovascular;;  VEINOUS/STENT   PERIPHERAL VASCULAR THROMBECTOMY Left 09/12/2017   Procedure: PERIPHERAL VASCULAR THROMBECTOMY;  Surgeon: Harvey Carlin BRAVO, MD;  Location: [facility] INVASIVE CV LAB;  Service: Cardiovascular;  Laterality: Left;   PERIPHERAL VASCULAR THROMBECTOMY Left 09/15/2017   Procedure: PERIPHERAL VASCULAR THROMBECTOMY;  Surgeon: Sheree Penne Bruckner, MD;  Location: [facility] INVASIVE CV LAB;  Service: Cardiovascular;  Laterality: Left;  IVC TO LT FEM/POP VEIN   SHOULDER ARTHROSCOPY WITH SUBACROMIAL DECOMPRESSION Left 06/23/2013   Procedure: LEFT SHOULDER ARTHROSCOPY WITH DEBRIDEMENT ROTATOR CUFF AND LABRUM;  Surgeon: Lamar DELENA Millman, MD;  Location: [facility];  Service: Orthopedics;  Laterality: Left;   SHOULDER SURGERY  1999   left     Prior to Admission medications   Medication Sig Start Date End Date Taking? Authorizing Provider  alendronate (FOSAMAX) 70 MG tablet Take 70 mg by mouth every Sunday. 09/28/21   [provider]  atorvastatin  (LIPITOR ) 80 MG tablet Take 1 tablet (80 mg total) by mouth daily. 09/05/23   Strader, Laymon HERO, PA-C  Cholecalciferol  (VITAMIN D ) 50 MCG (2000 UT) CAPS Take 2,000 Units by mouth daily.    [provider]  clopidogrel  (PLAVIX ) 75 MG tablet Take 1 tablet (75 mg total) by mouth daily. 09/05/23   Strader, Laymon HERO, PA-C  dofetilide  (TIKOSYN ) 250 MCG capsule Take 1 capsule (250 mcg total) by mouth 2 (two) times daily. 09/05/23   Strader, Brittany M, PA-C  fluticasone  (FLONASE ) 50 MCG/ACT nasal spray Place 2 sprays into both nostrils daily as needed for rhinitis (congestion/ dryness). 06/29/18   [provider]  glipiZIDE  (GLUCOTROL  XL) 5 MG 24 hr tablet Take 5 mg by mouth  every morning. 02/12/22   [provider]  JARDIANCE  10 MG TABS tablet Take 10 mg by mouth daily. 02/11/21   [provider]  ketoconazole  (NIZORAL ) 2 % cream Apply 1 Application topically 2 (two) times daily. 08/22/22   [provider]  Lancets [facility] DELICA PL[facility] [employer]) MISC 1 each daily. 09/03/21   [provider]  losartan  (COZAAR ) 100 MG tablet Take 100 mg by mouth daily with breakfast.     [provider]  MOUNJARO 10 MG/0.5ML Pen Inject 10 mg into the skin once a week. 12/30/22   [provider]  nitroGLYCERIN  (NITROSTAT ) 0.4 MG SL tablet Place 1 tablet (0.4 mg total) under the tongue every 5 (five) minutes as needed for chest pain. 04/28/19   Jerri Keys, MD  potassium chloride  SA (KLOR-CON ) 20 MEQ tablet Take 1 tablet (20 mEq total) by mouth 2 (two) times daily. 09/23/19   Lesia Ozell Barter, PA-C  rivaroxaban  (XARELTO ) 20 MG TABS tablet Take 1 tablet (20 mg total) by mouth daily with supper. 09/05/23   Johnson, Laymon HERO, PA-C  Skin Protectants, Misc. (EUCERIN) cream Apply 1 Application topically daily.  [provider]  spironolactone  (ALDACTONE ) 25 MG tablet Take 25 mg by mouth daily with breakfast.     [provider]  torsemide  (DEMADEX ) 20 MG tablet Take 60 mg by mouth daily after breakfast.     [provider]    Allergies: Penicillins and Doxycycline     Review of Systems  Musculoskeletal:  Positive for myalgias.    Updated Vital Signs BP 109/63   Pulse (!) 59   Temp 99.6 F (37.6 C) (Oral)   Resp 16   Ht 5' 4 (1.626 m)   Wt 97 kg   SpO2 96%   BMI 36.71 kg/m   Physical Exam Vitals and nursing note reviewed.  Constitutional:      General: She is not in acute distress.    Appearance: She is well-developed.  HENT:     Head: Normocephalic and atraumatic.  Eyes:     Conjunctiva/sclera: Conjunctivae normal.  Cardiovascular:     Rate and Rhythm: Normal rate and regular rhythm.      Heart sounds: No murmur heard. Pulmonary:     Effort: Pulmonary effort is normal. No respiratory distress.     Breath sounds: Normal breath sounds.  Abdominal:     Palpations: Abdomen is soft.     Tenderness: There is no abdominal tenderness.  Musculoskeletal:     Cervical back: Neck supple.     Comments: Bilateral symmetric nonpitting edema, localized erythema, warmth and tenderness to proximal calf, PT/DP pulses 2+  Skin:    General: Skin is warm and dry.     Capillary Refill: Capillary refill takes less than 2 seconds.  Neurological:     Mental Status: She is alert.  Psychiatric:        Mood and Affect: Mood normal.     (all labs ordered are listed, but only abnormal results are displayed) Labs Reviewed  CBC WITH DIFFERENTIAL/PLATELET - Abnormal; Notable for the following components:      Result Value   WBC 20.7 (*)    RBC 3.83 (*)    Hemoglobin 11.7 (*)    HCT 35.1 (*)    Neutro Abs 18.3 (*)    Monocytes Absolute 1.3 (*)    Abs Immature Granulocytes 0.10 (*)    All other components within normal limits  COMPREHENSIVE METABOLIC PANEL WITH GFR - Abnormal; Notable for the following components:   Sodium 132 (*)    Glucose, Bld 118 (*)    Creatinine, Ser 1.06 (*)    Total Bilirubin 1.5 (*)    GFR, Estimated 57 (*)    All other components within normal limits  LACTIC ACID, PLASMA  LACTIC ACID, PLASMA  D-DIMER, QUANTITATIVE    EKG: None  Radiology: No results found.   Procedures   Medications Ordered in the ED  oxyCODONE  (Oxy IR/ROXICODONE ) immediate release tablet 5 mg (has no administration in time range)  clindamycin  (CLEOCIN ) capsule 450 mg (has no administration in time range)                                    Medical Decision Making  This patient presents to the ED with chief complaint(s) of  .  The complaint involves an extensive differential diagnosis and also carries with it a high risk of complications and morbidity.   Pertinent past medical  history as listed in HPI  The differential diagnosis includes  Cellulitis, abscess, lymphedema, DVT Additional history  obtained: Additional history obtained from family Records reviewed Care Everywhere/External Records  Assessment and management:   Patient presents with complaints of left lower extremity erythema and pain that started earlier today.  Reports that her legs are chronically swollen does not feel like her leg is any more swollen today than usual.  Reports compliance with her Xarelto  and Plavix .  Low suspicion for DVT.  Additionally do not have ultrasound available today.  On exam patient has localized erythema, warmth and tenderness to left calf.  No open wounds or purulence.  She does have a notable leukocytosis without an elevated lactic.  Her vitals are within normal limits.  Initial temperature 99.6, rechecked at 98.6.  Patient is insistent on going home.  Patient is allergic to penicillins as well as doxycycline .  Sending prescription for clindamycin .  Will give a dose while she is here.    Independent ECG interpretation:  none  Independent labs interpretation:  The following labs were independently interpreted:  CBC with leukocytosis of 20.7, CMP with sodium of 132, creatinine mildly elevated from baseline, lactic acid without elevation  Independent visualization and interpretation of imaging: I independently visualized the following imaging with scope of interpretation limited to determining acute life threatening conditions related to emergency care: none    Consultations obtained:   none  Disposition:   Patient will be discharged home. The patient has been appropriately medically screened and/or stabilized in the ED. I have low suspicion for any other emergent medical condition which would require further screening, evaluation or treatment in the ED or require inpatient management. At time of discharge the patient is hemodynamically stable and in no acute distress. I  have discussed work-up results and diagnosis with patient and answered all questions. Patient is agreeable with discharge plan. We discussed strict return precautions for returning to the emergency department and they verbalized understanding.     Social Determinants of Health:   none  This note was dictated with voice recognition software.  Despite best efforts at proofreading, errors may have occurred which can change the documentation meaning.       Final diagnoses:  Cellulitis of left lower extremity    ED Discharge Orders     None          Jerline Linzy H, PA-C 09/06/23 1458    Francesca Elsie CROME, MD 09/15/23 [phone]

## 2023-09-06 NOTE — ED Triage Notes (Signed)
 Pt stated that she woke up this morning due to very painful and slightly swollen left leg. Pt stated that her left leg is normally swollen and red due to artery disease but it is more painful to the touch this morning and that is what brought her in

## 2023-09-06 NOTE — Discharge Instructions (Addendum)
 You were evaluated in the emergency room for leg pain.  You are found to have cellulitis.  A prescription for antibiotics was sent into your pharmacy.  If you experience any new or worsening symptoms please return to emergency room.

## 2023-09-08 ENCOUNTER — Telehealth: Payer: Self-pay | Admitting: *Deleted

## 2023-09-08 DIAGNOSIS — R911 Solitary pulmonary nodule: Secondary | ICD-10-CM

## 2023-09-08 DIAGNOSIS — Z87891 Personal history of nicotine dependence: Secondary | ICD-10-CM

## 2023-09-08 NOTE — Telephone Encounter (Signed)
 Call report from [facility]:  IMPRESSION: 1. Lung-RADS 3, probably benign findings. Short-term follow-up in 6 months is recommended with repeat low-dose chest CT without contrast (please use the following order, CT CHEST LCS NODULE FOLLOW-UP W/O CM). 2. Mild emphysema. Multifocal air trapping related to small airways disease. 3. Progressive enlargement of the central pulmonary arteries in keeping with changes of pulmonary arterial hypertension. 4. Moderate coronary artery calcification.   Aortic Atherosclerosis (ICD10-I70.0) and Emphysema (ICD10-J43.9).  Called and spoke with patient and reviewed lung screening CT results. Small lung nodule noted. Recommendation is to repeat scan in 6 months. Mild emphysema. Coronary artery calcification noted. Patient is on Lipitor . Patient verbalized understanding and is aware we will call her closer to 6 months to repeat scan. Results/ plans faxed to PCP.

## 2023-09-09 ENCOUNTER — Other Ambulatory Visit: Payer: Self-pay | Admitting: Internal Medicine

## 2023-09-09 ENCOUNTER — Other Ambulatory Visit: Payer: Self-pay | Admitting: Cardiology

## 2023-09-09 DIAGNOSIS — J4489 Other specified chronic obstructive pulmonary disease: Secondary | ICD-10-CM

## 2023-09-10 DIAGNOSIS — I480 Paroxysmal atrial fibrillation: Secondary | ICD-10-CM | POA: Diagnosis not present

## 2023-09-10 DIAGNOSIS — I10 Essential (primary) hypertension: Secondary | ICD-10-CM | POA: Diagnosis not present

## 2023-09-10 DIAGNOSIS — I2510 Atherosclerotic heart disease of native coronary artery without angina pectoris: Secondary | ICD-10-CM | POA: Diagnosis not present

## 2023-09-10 DIAGNOSIS — E1129 Type 2 diabetes mellitus with other diabetic kidney complication: Secondary | ICD-10-CM | POA: Diagnosis not present

## 2023-09-10 DIAGNOSIS — E785 Hyperlipidemia, unspecified: Secondary | ICD-10-CM | POA: Diagnosis not present

## 2023-09-11 ENCOUNTER — Encounter: Payer: Self-pay | Admitting: Internal Medicine

## 2023-09-15 ENCOUNTER — Ambulatory Visit (INDEPENDENT_AMBULATORY_CARE_PROVIDER_SITE_OTHER)

## 2023-09-15 ENCOUNTER — Encounter (INDEPENDENT_AMBULATORY_CARE_PROVIDER_SITE_OTHER): Payer: Self-pay | Admitting: Vascular Surgery

## 2023-09-15 ENCOUNTER — Ambulatory Visit (INDEPENDENT_AMBULATORY_CARE_PROVIDER_SITE_OTHER): Admitting: Vascular Surgery

## 2023-09-15 ENCOUNTER — Telehealth: Payer: Self-pay | Admitting: Cardiology

## 2023-09-15 ENCOUNTER — Other Ambulatory Visit (INDEPENDENT_AMBULATORY_CARE_PROVIDER_SITE_OTHER): Payer: Self-pay | Admitting: Vascular Surgery

## 2023-09-15 VITALS — BP 129/70 | HR 66 | Ht 64.0 in | Wt 213.2 lb

## 2023-09-15 DIAGNOSIS — M7120 Synovial cyst of popliteal space [Baker], unspecified knee: Secondary | ICD-10-CM | POA: Diagnosis not present

## 2023-09-15 DIAGNOSIS — I10 Essential (primary) hypertension: Secondary | ICD-10-CM | POA: Diagnosis not present

## 2023-09-15 DIAGNOSIS — L03116 Cellulitis of left lower limb: Secondary | ICD-10-CM

## 2023-09-15 DIAGNOSIS — M7989 Other specified soft tissue disorders: Secondary | ICD-10-CM

## 2023-09-15 DIAGNOSIS — E119 Type 2 diabetes mellitus without complications: Secondary | ICD-10-CM | POA: Diagnosis not present

## 2023-09-15 NOTE — Telephone Encounter (Signed)
*  STAT* If patient is at the pharmacy, call can be transferred to refill team.   1. Which medications need to be refilled? (please list name of each medication and dose if known) atorvastatin  (LIPITOR ) 80 MG tablet    2. Would you like to learn more about the convenience, safety, & potential cost savings by using the [facility]? Na     3. Are you open to using the [facility] (Type [facility]. Na ).   4. Which pharmacy/location (including street and city if local pharmacy) is medication to be sent to? [facility]     5. Do they need a 30 day or 90 day supply? 90

## 2023-09-19 ENCOUNTER — Ambulatory Visit (HOSPITAL_COMMUNITY)
Admission: RE | Admit: 2023-09-19 | Discharge: 2023-09-19 | Disposition: A | Discharge status: Home / Self Care | Source: Ambulatory Visit | Attending: Student | Admitting: Student

## 2023-09-19 ENCOUNTER — Ambulatory Visit: Payer: Self-pay | Admitting: Student

## 2023-09-19 DIAGNOSIS — I350 Nonrheumatic aortic (valve) stenosis: Secondary | ICD-10-CM | POA: Diagnosis not present

## 2023-09-19 LAB — ECHOCARDIOGRAM COMPLETE
AR max vel: 1.82 cm2
AV Area VTI: 1.75 cm2
AV Area mean vel: 1.67 cm2
AV Mean grad: 11.5 mmHg
AV Peak grad: 22.2 mmHg
Ao pk vel: 2.36 m/s
Area-P 1/2: 2.95 cm2
MV M vel: 3.44 m/s
MV Peak grad: 47.3 mmHg
S' Lateral: 3.3 cm

## 2023-09-19 NOTE — Progress Notes (Signed)
*  PRELIMINARY RESULTS* Echocardiogram 2D Echocardiogram has been performed.  Amy Tran Stallion 09/19/2023, 10:28 AM

## 2023-09-22 ENCOUNTER — Encounter (INDEPENDENT_AMBULATORY_CARE_PROVIDER_SITE_OTHER): Payer: Self-pay | Admitting: Vascular Surgery

## 2023-09-22 ENCOUNTER — Ambulatory Visit (INDEPENDENT_AMBULATORY_CARE_PROVIDER_SITE_OTHER): Admitting: Nurse Practitioner

## 2023-09-22 ENCOUNTER — Encounter (INDEPENDENT_AMBULATORY_CARE_PROVIDER_SITE_OTHER): Payer: Self-pay

## 2023-09-22 VITALS — BP 109/64 | HR 60 | Resp 18 | Wt 209.0 lb

## 2023-09-22 DIAGNOSIS — M7989 Other specified soft tissue disorders: Secondary | ICD-10-CM

## 2023-09-22 NOTE — Progress Notes (Signed)
 Subjective:    Patient ID: Amy Tran, female    DOB: 28-Aug-1955, 68 y.o.   MRN: [id number] Chief Complaint  Patient presents with   ED follow up for Cellulitis of left lower extremity    Amy Tran is a 68 yo female who presents to clinic today for follow up of left lower extremity cellulitis treated in [facility] emergency department on 09/06/23. Patient presented with complaints of left lower extremity erythema and pain that started 3 days prior.  Reports that her legs are chronically swollen does not feel like her leg is any more swollen today than usual. She endorses compliance with her Xarelto  and Plavix .  There was low suspicion for DVT at the time and no ultrasounds were completed. She denies any open wounds or purulence. She denies any pain or tenderness to her calves. She did have a notable leukocytosis.  Patient was insistent on going home from the ER.  Patient is allergic to penicillins as well as doxycycline . Patient given a prescription for clindamycin .     Review of Systems  Constitutional: Negative.   Cardiovascular:  Positive for leg swelling.  Musculoskeletal:  Positive for gait problem.       Positive Bakers Cysts   Skin:  Positive for color change.  All other systems reviewed and are negative.      Objective:   Physical Exam Constitutional:      Appearance: Normal appearance. She is obese.  HENT:     Head: Normocephalic.  Eyes:     Pupils: Pupils are equal, round, and reactive to light.  Cardiovascular:     Rate and Rhythm: Normal rate and regular rhythm.     Pulses: Normal pulses.     Heart sounds: Normal heart sounds.  Pulmonary:     Effort: Pulmonary effort is normal.     Breath sounds: Normal breath sounds.  Abdominal:     General: Bowel sounds are normal.     Palpations: Abdomen is soft.  Musculoskeletal:        General: Swelling and tenderness present.     Right lower leg: Edema present.     Left lower leg: Edema present.  Skin:     General: Skin is warm and dry.     Capillary Refill: Capillary refill takes 2 to 3 seconds.  Neurological:     General: No focal deficit present.     Mental Status: She is alert and oriented to person, place, and time. Mental status is at baseline.  Psychiatric:        Mood and Affect: Mood normal.        Behavior: Behavior normal.        Thought Content: Thought content normal.        Judgment: Judgment normal.     BP 129/70   Pulse 66   Ht 5' 4 (1.626 m)   Wt 213 lb 4 oz (96.7 kg)   BMI 36.60 kg/m   Past Medical History:  Diagnosis Date   Aortic stenosis    Arthritis    Asthma    Cancer (HCC)    multiple skin cancers   Coronary artery disease    Diabetes mellitus without complication (HCC)    Diverticulosis    Dysrhythmia    Fluid retention    GERD (gastroesophageal reflux disease)    High cholesterol    Hyperlipidemia    Hypertension    Incomplete rotator cuff tear    Myocardial infarction (HCC)  Obesity    Osteoporosis    PAD (peripheral artery disease) (HCC)    Persistent atrial fibrillation (HCC)    PONV (postoperative nausea and vomiting)    Wears glasses    Wears partial dentures    bottom    Social History   Socioeconomic History   Marital status: Widowed    Spouse name: Not on file   Number of children: 2   Years of education: Not on file   Highest education level: Not on file  Occupational History   Occupation: works at [employer] store    Employer: [employer]   Tobacco Use   Smoking status: Every Day    Current packs/day: 1.50    Average packs/day: 1.5 packs/day for 53.5 years (80.2 ttl pk-yrs)    Types: Cigarettes    Start date: 04/09/1970   Smokeless tobacco: Never   Tobacco comments:    09/04/20 - states she is down to 9 ciggs / day   Vaping Use   Vaping status: Never Used  Substance and Sexual Activity   Alcohol use: Not Currently   Drug use: No   Sexual activity: Not Currently    Birth control/protection: Post-menopausal  Other Topics  Concern   Not on file  Social History Narrative   Not on file   Social Drivers of Health   Financial Resource Strain: Not on file  Food Insecurity: Not on file  Transportation Needs: Not on file  Physical Activity: Unknown (09/15/2017)   Exercise Vital Sign    Days of Exercise per Week: Patient declined    Minutes of Exercise per Session: Patient declined  Stress: Unknown (09/15/2017)   [employer] - Occupational Stress Questionnaire    Feeling of Stress : Patient declined  Social Connections: Unknown (09/15/2017)   Social Connection and Isolation Panel    Frequency of Communication with Friends and Family: Patient declined    Frequency of Social Gatherings with Friends and Family: Patient declined    Attends Religious Services: Patient declined    Active Member of Clubs or Organizations: Patient declined    Attends Banker Meetings: Patient declined    Marital Status: Patient declined  Intimate Partner Violence: Unknown (09/15/2017)   Humiliation, Afraid, Rape, and Kick questionnaire    Fear of Current or Ex-Partner: Patient declined    Emotionally Abused: Patient declined    Physically Abused: Patient declined    Sexually Abused: Patient declined    Past Surgical History:  Procedure Laterality Date   APPENDECTOMY     CARDIOVERSION N/A 06/03/2019   Procedure: CARDIOVERSION;  Surgeon: Charls Pearla LABOR, MD;  Location: [facility] ORS;  Service: Cardiovascular;  Laterality: N/A;   CARDIOVERSION N/A 07/13/2019   Procedure: CARDIOVERSION;  Surgeon: Barbaraann Darryle Ned, MD;  Location: [facility] ENDOSCOPY;  Service: Cardiovascular;  Laterality: N/A;   CARPAL TUNNEL RELEASE Bilateral 2001   bilateral   CERVICAL POLYPECTOMY  04/08/2017   Procedure: POLYPECTOMY;  Surgeon: Edsel Norleen GAILS, MD;  Location: [facility]S;  Service: Gynecology;;  Endometrial   CHOLECYSTECTOMY     COLONOSCOPY  2007   MFM:[id number] internal hemorrhoids. Diminutive rectal polyp at 10 cm, cold  biopsied/removed. The remainder of the rectal mucosa appeared normal Swallow left-sided diverticula. Diminutive polyp at the splenic flexure cold biopsied/removed (adenomatous)   COLONOSCOPY N/A 12/28/2013   Procedure: COLONOSCOPY;  Surgeon: Lamar CHRISTELLA Hollingshead, MD;  Location: [facility] ENDO SUITE;  Service: Endoscopy;  Laterality: N/A;  730 - moved to 8:30 - Ginger notified pt  CORONARY STENT INTERVENTION N/A 04/27/2019   Procedure: CORONARY STENT INTERVENTION;  Surgeon: Burnard Debby LABOR, MD;  Location: [facility] INVASIVE CV LAB;  Service: Cardiovascular;  Laterality: N/A;   CORONARY ULTRASOUND/IVUS N/A 09/12/2017   Procedure: INTRAVASCULAR ULTRASOUND/IVUS;  Surgeon: Harvey Carlin BRAVO, MD;  Location: [facility] INVASIVE CV LAB;  Service: Cardiovascular;  Laterality: N/A;   DILATION AND CURETTAGE, DIAGNOSTIC / THERAPEUTIC  03/07/2017   ERCP  2002   GASTRIC BYPASS  2004   HYSTEROSCOPY WITH D & C N/A 04/08/2017   Procedure: DILATATION AND CURETTAGE /HYSTEROSCOPY;  Surgeon: Edsel Norleen GAILS, MD;  Location: [facility]S;  Service: Gynecology;  Laterality: N/A;   LEFT HEART CATH AND CORONARY ANGIOGRAPHY N/A 04/27/2019   Procedure: LEFT HEART CATH AND CORONARY ANGIOGRAPHY;  Surgeon: Burnard Debby LABOR, MD;  Location: [facility] INVASIVE CV LAB;  Service: Cardiovascular;  Laterality: N/A;   LOWER EXTREMITY ANGIOGRAPHY Left 10/02/2020   Procedure: LOWER EXTREMITY ANGIOGRAPHY;  Surgeon: Marea Selinda RAMAN, MD;  Location: [facility] INVASIVE CV LAB;  Service: Cardiovascular;  Laterality: Left;   LUMBAR LAMINECTOMY/DECOMPRESSION MICRODISCECTOMY Right 03/15/2022   Procedure: Microdiscectomy - right - Lumbar Four-Lumbar Five extraforaminal;  Surgeon: Louis Shove, MD;  Location: [facility] OR;  Service: Neurosurgery;  Laterality: Right;   NECK SURGERY  1998   fusion   PERCUTANEOUS VENOUS THROMBECTOMY,LYSIS WITH INTRAVASCULAR ULTRASOUND (IVUS) Left 09/13/2017   Procedure: REMOVAL OF LYSIS CATHETER AND INTRAVASCULAR ULTRASOUND (IVUS) LEFT ILIAC VEIN;  Surgeon: Harvey Carlin BRAVO, MD;   Location: [facility] OR;  Service: Vascular;  Laterality: Left;   PERIPHERAL VASCULAR INTERVENTION  09/15/2017   Procedure: PERIPHERAL VASCULAR INTERVENTION;  Surgeon: Sheree Penne Bruckner, MD;  Location: [facility] INVASIVE CV LAB;  Service: Cardiovascular;;  VEINOUS/STENT   PERIPHERAL VASCULAR THROMBECTOMY Left 09/12/2017   Procedure: PERIPHERAL VASCULAR THROMBECTOMY;  Surgeon: Harvey Carlin BRAVO, MD;  Location: [facility] INVASIVE CV LAB;  Service: Cardiovascular;  Laterality: Left;   PERIPHERAL VASCULAR THROMBECTOMY Left 09/15/2017   Procedure: PERIPHERAL VASCULAR THROMBECTOMY;  Surgeon: Sheree Penne Bruckner, MD;  Location: [facility] INVASIVE CV LAB;  Service: Cardiovascular;  Laterality: Left;  IVC TO LT FEM/POP VEIN   SHOULDER ARTHROSCOPY WITH SUBACROMIAL DECOMPRESSION Left 06/23/2013   Procedure: LEFT SHOULDER ARTHROSCOPY WITH DEBRIDEMENT ROTATOR CUFF AND LABRUM;  Surgeon: Lamar LABOR Millman, MD;  Location: [facility];  Service: Orthopedics;  Laterality: Left;   SHOULDER SURGERY  1999   left    Family History  Problem Relation Age of Onset   Diabetes Father    Hypertension Father    Parkinson's disease Father    Cancer Father        not sure what kind   Hypertension Mother    Heart disease Mother    Drug abuse Sister    Mesothelioma Brother    Cancer Daughter        non-hodgkins lmphoma   Hypertension Brother    Diabetes Brother    Colon cancer Neg Hx    Gastric cancer Neg Hx    Esophageal cancer Neg Hx     Allergies  Allergen Reactions   Penicillins Hives and Rash    Has patient had a PCN reaction causing immediate rash, facial/tongue/throat swelling, SOB or lightheadedness with hypotension: Yes Has patient had a PCN reaction causing severe rash involving mucus membranes or skin necrosis: No Has patient had a PCN reaction that required hospitalization No Has patient had a PCN reaction occurring within the last 10 years: No If all of the above answers are NO, then may proceed  with  Cephalosporin use.    Doxycycline  Itching       Latest Ref Rng & Units 09/06/2023    1:45 PM 10/10/2022   10:03 AM 08/16/2022   12:47 PM  CBC  WBC 4.0 - 10.5 K/uL 20.7  8.3  8.5   Hemoglobin 12.0 - 15.0 g/dL 88.2  86.2  86.5   Hematocrit 36.0 - 46.0 % 35.1  41.6  40.2   Platelets 150 - 400 K/uL 282  266  237       CMP     Component Value Date/Time   NA 132 (L) 09/06/2023 1345   NA 137 11/03/2019 1123   K 4.2 09/06/2023 1345   CL 98 09/06/2023 1345   CO2 23 09/06/2023 1345   GLUCOSE 118 (H) 09/06/2023 1345   BUN 21 09/06/2023 1345   BUN 9 11/03/2019 1123   CREATININE 1.06 (H) 09/06/2023 1345   CALCIUM  9.4 09/06/2023 1345   PROT 7.1 09/06/2023 1345   ALBUMIN 3.9 09/06/2023 1345   AST 18 09/06/2023 1345   ALT 14 09/06/2023 1345   ALKPHOS 45 09/06/2023 1345   BILITOT 1.5 (H) 09/06/2023 1345   GFRNONAA 57 (L) 09/06/2023 1345     No results found.     Assessment & Plan:   1. Cellulitis of left lower extremity (Primary) Patient presents to clinic today as follow-up for cellulitis of the left lower extremity.  Cellulitis appears to be in good control.  Patient underwent bilateral lower extremity venous duplex ultrasounds to assess DVTs.  Patient does not have any evidence of deep vein thrombosis.  However there is a cystic structure that is found in the popliteal fossa as well as extensive edema seen of the left popliteal space which may be consistent with a ruptured Baker's cyst.  Consult to orthopedics will be sent for treatment of Baker's cyst.  Patient will also have Unna boot wraps to right lower extremity for 4 weeks.  Patient then will follow-up in 5 weeks with me for recheck.  Plan is to get home health come to her house and do the weekly Unna boot wraps.  2. Essential hypertension Continue antihypertensive medications as already ordered, these medications have been reviewed and there are no changes at this time.  3. Diabetes mellitus without complication  (HCC) Continue hypoglycemic medications as already ordered, these medications have been reviewed and there are no changes at this time.  Hgb A1C to be monitored as already arranged by primary service  4. Morbid obesity due to excess calories (HCC) I had a long detailed discussion today with the patient concerning her weight and how this plays into her diabetes, hypertension and lower extremity pain and swelling.  I suggested she speak to her PCP about medications to help with weight loss.   Current Outpatient Medications on File Prior to Visit  Medication Sig Dispense Refill   alendronate (FOSAMAX) 70 MG tablet Take 70 mg by mouth every Sunday.     atorvastatin  (LIPITOR ) 80 MG tablet Take 1 tablet (80 mg total) by mouth daily. 90 tablet 3   Cholecalciferol  (VITAMIN D ) 50 MCG (2000 UT) CAPS Take 2,000 Units by mouth daily.     clopidogrel  (PLAVIX ) 75 MG tablet Take 1 tablet (75 mg total) by mouth daily. 90 tablet 1   dofetilide  (TIKOSYN ) 250 MCG capsule TAKE 1 CAPSULE BY MOUTH TWICE A DAY 180 capsule 3   fluticasone  (FLONASE ) 50 MCG/ACT nasal spray Place 2 sprays into both nostrils daily as needed  for rhinitis (congestion/ dryness).     glipiZIDE  (GLUCOTROL  XL) 5 MG 24 hr tablet Take 5 mg by mouth every morning.     JARDIANCE  10 MG TABS tablet Take 10 mg by mouth daily.     ketoconazole  (NIZORAL ) 2 % cream Apply 1 Application topically 2 (two) times daily.     Lancets (ONETOUCH DELICA PLUS LANCET33G) MISC 1 each daily.     losartan  (COZAAR ) 100 MG tablet Take 100 mg by mouth daily with breakfast.      MOUNJARO 10 MG/0.5ML Pen Inject 10 mg into the skin once a week.     nitroGLYCERIN  (NITROSTAT ) 0.4 MG SL tablet Place 1 tablet (0.4 mg total) under the tongue every 5 (five) minutes as needed for chest pain. 30 tablet 0   potassium chloride  SA (KLOR-CON ) 20 MEQ tablet Take 1 tablet (20 mEq total) by mouth 2 (two) times daily.     rivaroxaban  (XARELTO ) 20 MG TABS tablet Take 1 tablet (20 mg  total) by mouth daily with supper. 90 tablet 3   Skin Protectants, Misc. (EUCERIN) cream Apply 1 Application topically daily.     spironolactone  (ALDACTONE ) 25 MG tablet Take 25 mg by mouth daily with breakfast.      torsemide  (DEMADEX ) 20 MG tablet Take 60 mg by mouth daily after breakfast.      No current facility-administered medications on file prior to visit.    There are no Patient Instructions on file for this visit. No follow-ups on file.   Gwendlyn JONELLE Shank, NP

## 2023-09-22 NOTE — Progress Notes (Signed)
 History of Present Illness  There is no documented history at this time  Assessments & Plan   There are no diagnoses linked to this encounter.    Additional instructions  Subjective:  Patient presents with venous ulcer of the Left lower extremity.    Procedure:  3 layer unna wrap was placed Left lower extremity.   Plan:   Follow up in one week.

## 2023-09-24 ENCOUNTER — Other Ambulatory Visit: Payer: Self-pay

## 2023-09-24 NOTE — Progress Notes (Addendum)
 Counseled patient 4 minutes regarding tobacco use.

## 2023-09-26 ENCOUNTER — Other Ambulatory Visit (INDEPENDENT_AMBULATORY_CARE_PROVIDER_SITE_OTHER): Payer: Self-pay

## 2023-09-29 ENCOUNTER — Ambulatory Visit (INDEPENDENT_AMBULATORY_CARE_PROVIDER_SITE_OTHER): Admitting: Nurse Practitioner

## 2023-09-29 ENCOUNTER — Encounter (INDEPENDENT_AMBULATORY_CARE_PROVIDER_SITE_OTHER): Payer: Self-pay | Admitting: Nurse Practitioner

## 2023-09-29 VITALS — BP 134/69 | HR 54 | Resp 18

## 2023-09-29 DIAGNOSIS — L03116 Cellulitis of left lower limb: Secondary | ICD-10-CM | POA: Diagnosis not present

## 2023-09-29 NOTE — Progress Notes (Signed)
 History of Present Illness  There is no documented history at this time  Assessments & Plan   There are no diagnoses linked to this encounter.    Additional instructions  Subjective:  Patient presents with venous ulcer of the Left lower extremity.    Procedure:  3 layer unna wrap was placed Left lower extremity.   Plan:   Follow up in one week.

## 2023-09-30 ENCOUNTER — Other Ambulatory Visit (INDEPENDENT_AMBULATORY_CARE_PROVIDER_SITE_OTHER): Payer: Self-pay | Admitting: Nurse Practitioner

## 2023-09-30 DIAGNOSIS — T148XXA Other injury of unspecified body region, initial encounter: Secondary | ICD-10-CM | POA: Diagnosis not present

## 2023-10-01 DIAGNOSIS — M1712 Unilateral primary osteoarthritis, left knee: Secondary | ICD-10-CM | POA: Diagnosis not present

## 2023-10-01 DIAGNOSIS — M79605 Pain in left leg: Secondary | ICD-10-CM | POA: Diagnosis not present

## 2023-10-01 DIAGNOSIS — M25562 Pain in left knee: Secondary | ICD-10-CM | POA: Diagnosis not present

## 2023-10-01 DIAGNOSIS — M7122 Synovial cyst of popliteal space [Baker], left knee: Secondary | ICD-10-CM | POA: Diagnosis not present

## 2023-10-06 ENCOUNTER — Other Ambulatory Visit (INDEPENDENT_AMBULATORY_CARE_PROVIDER_SITE_OTHER): Payer: Self-pay | Admitting: Nurse Practitioner

## 2023-10-06 ENCOUNTER — Ambulatory Visit (INDEPENDENT_AMBULATORY_CARE_PROVIDER_SITE_OTHER): Admitting: Nurse Practitioner

## 2023-10-06 ENCOUNTER — Telehealth (INDEPENDENT_AMBULATORY_CARE_PROVIDER_SITE_OTHER): Payer: Self-pay

## 2023-10-06 VITALS — BP 143/72 | HR 63 | Ht 64.0 in | Wt 194.0 lb

## 2023-10-06 DIAGNOSIS — L03116 Cellulitis of left lower limb: Secondary | ICD-10-CM

## 2023-10-06 LAB — AEROBIC CULTURE

## 2023-10-06 MED ORDER — CIPROFLOXACIN HCL 250 MG PO TABS: 750.0000 mg | ORAL_TABLET | Freq: Two times a day (BID) | ORAL | 0 refills | Status: AC

## 2023-10-06 MED ORDER — HYDROCODONE-ACETAMINOPHEN 5-325 MG PO TABS: 1.0000 | ORAL_TABLET | Freq: Four times a day (QID) | ORAL | 0 refills | Status: AC | PRN

## 2023-10-06 NOTE — Telephone Encounter (Signed)
 She likely has an underlying infection which is the pain is so bad.  I have sent in abx based on her culture, as well as hydrocodone .  We can send this as a one time rx

## 2023-10-06 NOTE — Telephone Encounter (Signed)
 Pt came in to get  wrapped for [employer], she stated that she hasn't been able to sleep because of the pain that she is having in Left leg , she stated that she was taking Tramdol for the pain, she said that it wasn't helping. She want to know if she could have something stronger for pain. Please advise.

## 2023-10-07 ENCOUNTER — Encounter (INDEPENDENT_AMBULATORY_CARE_PROVIDER_SITE_OTHER): Payer: Self-pay | Admitting: Nurse Practitioner

## 2023-10-07 NOTE — Telephone Encounter (Signed)
 Called and left message with patient sister asking her to call us  back, I also advised her to check her My Chart for the message. She stated that she will her check her My Chart Acct.

## 2023-10-07 NOTE — Progress Notes (Signed)
 History of Present Illness  There is no documented history at this time  Assessments & Plan   There are no diagnoses linked to this encounter.    Additional instructions  Subjective:  Patient presents with venous ulcer of the Left lower extremity.    Procedure:  3 layer unna wrap was placed Left lower extremity.   Plan:   Follow up in one week.

## 2023-10-14 ENCOUNTER — Encounter (INDEPENDENT_AMBULATORY_CARE_PROVIDER_SITE_OTHER): Payer: Self-pay | Admitting: Nurse Practitioner

## 2023-10-14 ENCOUNTER — Ambulatory Visit (INDEPENDENT_AMBULATORY_CARE_PROVIDER_SITE_OTHER): Admitting: Nurse Practitioner

## 2023-10-14 VITALS — BP 117/72 | HR 61 | Resp 18 | Wt 198.0 lb

## 2023-10-14 DIAGNOSIS — L03116 Cellulitis of left lower limb: Secondary | ICD-10-CM

## 2023-10-14 MED ORDER — METHYLPREDNISOLONE 4 MG PO TBPK: ORAL_TABLET | ORAL | 0 refills | Status: AC

## 2023-10-14 NOTE — Progress Notes (Signed)
 Orders placed during this lab encounter.

## 2023-10-14 NOTE — Progress Notes (Signed)
 History of Present Illness  There is no documented history at this time  Assessments & Plan   There are no diagnoses linked to this encounter.    Additional instructions  Subjective:  Patient presents with venous ulcer of the Left lower extremity.    Procedure:  3 layer unna wrap was placed Left lower extremity.   Plan:   Follow up in one week.

## 2023-10-20 ENCOUNTER — Encounter (INDEPENDENT_AMBULATORY_CARE_PROVIDER_SITE_OTHER): Payer: Self-pay | Admitting: Vascular Surgery

## 2023-10-20 ENCOUNTER — Ambulatory Visit (INDEPENDENT_AMBULATORY_CARE_PROVIDER_SITE_OTHER): Admitting: Vascular Surgery

## 2023-10-20 ENCOUNTER — Telehealth (INDEPENDENT_AMBULATORY_CARE_PROVIDER_SITE_OTHER): Payer: Self-pay

## 2023-10-20 VITALS — BP 130/75 | HR 51 | Ht 64.0 in | Wt 200.6 lb

## 2023-10-20 DIAGNOSIS — I890 Lymphedema, not elsewhere classified: Secondary | ICD-10-CM

## 2023-10-20 DIAGNOSIS — I10 Essential (primary) hypertension: Secondary | ICD-10-CM | POA: Diagnosis not present

## 2023-10-20 DIAGNOSIS — E119 Type 2 diabetes mellitus without complications: Secondary | ICD-10-CM

## 2023-10-20 NOTE — Telephone Encounter (Signed)
 Referral was accepted by [facility] for weekly unna wraps. I left a detailed message on the patient voicemail to expecting a call from the [facility] office

## 2023-10-20 NOTE — Progress Notes (Signed)
 Subjective:    Patient ID: Amy Tran, female    DOB: September 21, 1955, 68 y.o.   MRN: [id number] Chief Complaint  Patient presents with   Follow-up    Amy Tran is a 68 yo female who presents to clinic today in follow-up after having Unna boot wrap to her left leg for the past 4 weeks.  Patient endorses her left leg is a lot less swollen.  She still has some pain in the proximal calf area.  Her foot continues to remain swollen as well.  Her left lower extremity is less purple in color today.  There are no open wounds or skin breakdown today.  She endorses she has not gotten any conventional therapy such as compression socks for her right lower extremity.  I encouraged her to do that as soon as possible as well.    Review of Systems  Constitutional: Negative.   Cardiovascular:  Positive for leg swelling.  All other systems reviewed and are negative.      Objective:   Physical Exam Vitals reviewed.  Constitutional:      Appearance: Normal appearance. She is obese.  HENT:     Head: Normocephalic.  Eyes:     Pupils: Pupils are equal, round, and reactive to light.  Cardiovascular:     Rate and Rhythm: Normal rate and regular rhythm.     Pulses: Normal pulses.     Heart sounds: Normal heart sounds.  Pulmonary:     Effort: Pulmonary effort is normal.     Breath sounds: Normal breath sounds.  Abdominal:     General: Bowel sounds are normal.     Palpations: Abdomen is soft.  Musculoskeletal:        General: Swelling and tenderness present.     Right lower leg: Edema present.     Left lower leg: Edema present.  Skin:    General: Skin is warm and dry.     Capillary Refill: Capillary refill takes 2 to 3 seconds.     Comments: Left lower extremity continues to be purple in color but less than 4 weeks ago  Neurological:     General: No focal deficit present.     Mental Status: She is alert and oriented to person, place, and time. Mental status is at baseline.   Psychiatric:        Mood and Affect: Mood normal.        Behavior: Behavior normal.        Thought Content: Thought content normal.        Judgment: Judgment normal.     BP 130/75   Pulse (!) 51   Ht 5' 4 (1.626 m)   Wt 200 lb 9.6 oz (91 kg)   BMI 34.43 kg/m   Past Medical History:  Diagnosis Date   Aortic stenosis    Arthritis    Asthma    Cancer (HCC)    multiple skin cancers   Coronary artery disease    Diabetes mellitus without complication (HCC)    Diverticulosis    Dysrhythmia    Fluid retention    GERD (gastroesophageal reflux disease)    High cholesterol    Hyperlipidemia    Hypertension    Incomplete rotator cuff tear    Myocardial infarction (HCC)    Obesity    Osteoporosis    PAD (peripheral artery disease) (HCC)    Persistent atrial fibrillation (HCC)    PONV (postoperative nausea and vomiting)    Wears  glasses    Wears partial dentures    bottom    Social History   Socioeconomic History   Marital status: Widowed    Spouse name: Not on file   Number of children: 2   Years of education: Not on file   Highest education level: Not on file  Occupational History   Occupation: works at [employer] store    Employer: [employer]   Tobacco Use   Smoking status: Every Day    Current packs/day: 1.50    Average packs/day: 1.5 packs/day for 53.5 years (80.3 ttl pk-yrs)    Types: Cigarettes    Start date: 04/09/1970   Smokeless tobacco: Never   Tobacco comments:    09/04/20 - states she is down to 9 ciggs / day   Vaping Use   Vaping status: Never Used  Substance and Sexual Activity   Alcohol use: Not Currently   Drug use: No   Sexual activity: Not Currently    Birth control/protection: Post-menopausal  Other Topics Concern   Not on file  Social History Narrative   Not on file   Social Drivers of Health   Financial Resource Strain: Low Risk  (10/01/2023)   Received from [facility]   Overall Financial Resource Strain (CARDIA)     Difficulty of Paying Living Expenses: Not hard at all  Food Insecurity: No Food Insecurity (10/01/2023)   Received from [facility]   Hunger Vital Sign    Within the past 12 months, you worried that your food would run out before you got the money to buy more.: Never true    Within the past 12 months, the food you bought just didn't last and you didn't have money to get more.: Never true  Transportation Needs: No Transportation Needs (10/01/2023)   Received from [facility] - Transportation    In the past 12 months, has lack of transportation kept you from medical appointments or from getting medications?: No    Lack of Transportation (Non-Medical): No  Physical Activity: Unknown (09/15/2017)   Exercise Vital Sign    Days of Exercise per Week: Patient declined    Minutes of Exercise per Session: Patient declined  Stress: Unknown (09/15/2017)   [employer] [facility] [employer] - Occupational Stress Questionnaire    Feeling of Stress : Patient declined  Social Connections: Unknown (09/15/2017)   Social Connection and Isolation Panel    Frequency of Communication with Friends and Family: Patient declined    Frequency of Social Gatherings with Friends and Family: Patient declined    Attends Religious Services: Patient declined    Active Member of Clubs or Organizations: Patient declined    Attends Banker Meetings: Patient declined    Marital Status: Patient declined  Intimate Partner Violence: Unknown (09/15/2017)   Humiliation, Afraid, Rape, and Kick questionnaire    Fear of Current or Ex-Partner: Patient declined    Emotionally Abused: Patient declined    Physically Abused: Patient declined    Sexually Abused: Patient declined    Past Surgical History:  Procedure Laterality Date   APPENDECTOMY     CARDIOVERSION N/A 06/03/2019   Procedure: CARDIOVERSION;  Surgeon: Charls Pearla LABOR, MD;  Location: [facility] ORS;  Service:  Cardiovascular;  Laterality: N/A;   CARDIOVERSION N/A 07/13/2019   Procedure: CARDIOVERSION;  Surgeon: Barbaraann Darryle Ned, MD;  Location: [facility] ENDOSCOPY;  Service: Cardiovascular;  Laterality: N/A;   CARPAL TUNNEL RELEASE Bilateral 2001  bilateral   CERVICAL POLYPECTOMY  04/08/2017   Procedure: POLYPECTOMY;  Surgeon: Edsel Norleen GAILS, MD;  Location: [facility] ORS;  Service: Gynecology;;  Endometrial   CHOLECYSTECTOMY     COLONOSCOPY  2007   MFM:[id number] internal hemorrhoids. Diminutive rectal polyp at 10 cm, cold biopsied/removed. The remainder of the rectal mucosa appeared normal Swallow left-sided diverticula. Diminutive polyp at the splenic flexure cold biopsied/removed (adenomatous)   COLONOSCOPY N/A 12/28/2013   Procedure: COLONOSCOPY;  Surgeon: Lamar CHRISTELLA Hollingshead, MD;  Location: [facility] ENDO SUITE;  Service: Endoscopy;  Laterality: N/A;  730 - moved to 8:30 - Ginger notified pt   CORONARY STENT INTERVENTION N/A 04/27/2019   Procedure: CORONARY STENT INTERVENTION;  Surgeon: Burnard Debby LABOR, MD;  Location: [facility] INVASIVE CV LAB;  Service: Cardiovascular;  Laterality: N/A;   CORONARY ULTRASOUND/IVUS N/A 09/12/2017   Procedure: INTRAVASCULAR ULTRASOUND/IVUS;  Surgeon: Harvey Carlin BRAVO, MD;  Location: [facility] INVASIVE CV LAB;  Service: Cardiovascular;  Laterality: N/A;   DILATION AND CURETTAGE, DIAGNOSTIC / THERAPEUTIC  03/07/2017   ERCP  2002   GASTRIC BYPASS  2004   HYSTEROSCOPY WITH D & C N/A 04/08/2017   Procedure: DILATATION AND CURETTAGE /HYSTEROSCOPY;  Surgeon: Edsel Norleen GAILS, MD;  Location: [facility]S;  Service: Gynecology;  Laterality: N/A;   LEFT HEART CATH AND CORONARY ANGIOGRAPHY N/A 04/27/2019   Procedure: LEFT HEART CATH AND CORONARY ANGIOGRAPHY;  Surgeon: Burnard Debby LABOR, MD;  Location: [facility] INVASIVE CV LAB;  Service: Cardiovascular;  Laterality: N/A;   LOWER EXTREMITY ANGIOGRAPHY Left 10/02/2020   Procedure: LOWER EXTREMITY ANGIOGRAPHY;  Surgeon: Marea Selinda RAMAN, MD;  Location: [facility] INVASIVE CV LAB;  Service:  Cardiovascular;  Laterality: Left;   LUMBAR LAMINECTOMY/DECOMPRESSION MICRODISCECTOMY Right 03/15/2022   Procedure: Microdiscectomy - right - Lumbar Four-Lumbar Five extraforaminal;  Surgeon: Louis Shove, MD;  Location: [facility] OR;  Service: Neurosurgery;  Laterality: Right;   NECK SURGERY  1998   fusion   PERCUTANEOUS VENOUS THROMBECTOMY,LYSIS WITH INTRAVASCULAR ULTRASOUND (IVUS) Left 09/13/2017   Procedure: REMOVAL OF LYSIS CATHETER AND INTRAVASCULAR ULTRASOUND (IVUS) LEFT ILIAC VEIN;  Surgeon: Harvey Carlin BRAVO, MD;  Location: [facility] OR;  Service: Vascular;  Laterality: Left;   PERIPHERAL VASCULAR INTERVENTION  09/15/2017   Procedure: PERIPHERAL VASCULAR INTERVENTION;  Surgeon: Sheree Penne Bruckner, MD;  Location: [facility] INVASIVE CV LAB;  Service: Cardiovascular;;  VEINOUS/STENT   PERIPHERAL VASCULAR THROMBECTOMY Left 09/12/2017   Procedure: PERIPHERAL VASCULAR THROMBECTOMY;  Surgeon: Harvey Carlin BRAVO, MD;  Location: [facility] INVASIVE CV LAB;  Service: Cardiovascular;  Laterality: Left;   PERIPHERAL VASCULAR THROMBECTOMY Left 09/15/2017   Procedure: PERIPHERAL VASCULAR THROMBECTOMY;  Surgeon: Sheree Penne Bruckner, MD;  Location: [facility] INVASIVE CV LAB;  Service: Cardiovascular;  Laterality: Left;  IVC TO LT FEM/POP VEIN   SHOULDER ARTHROSCOPY WITH SUBACROMIAL DECOMPRESSION Left 06/23/2013   Procedure: LEFT SHOULDER ARTHROSCOPY WITH DEBRIDEMENT ROTATOR CUFF AND LABRUM;  Surgeon: Lamar LABOR Millman, MD;  Location: [facility];  Service: Orthopedics;  Laterality: Left;   SHOULDER SURGERY  1999   left    Family History  Problem Relation Age of Onset   Diabetes Father    Hypertension Father    Parkinson's disease Father    Cancer Father        not sure what kind   Hypertension Mother    Heart disease Mother    Drug abuse Sister    Mesothelioma Brother    Cancer Daughter        non-hodgkins lmphoma   Hypertension Brother  Diabetes Brother    Colon cancer Neg Hx    Gastric cancer Neg Hx     Esophageal cancer Neg Hx     Allergies  Allergen Reactions   Penicillins Hives and Rash    Has patient had a PCN reaction causing immediate rash, facial/tongue/throat swelling, SOB or lightheadedness with hypotension: Yes Has patient had a PCN reaction causing severe rash involving mucus membranes or skin necrosis: No Has patient had a PCN reaction that required hospitalization No Has patient had a PCN reaction occurring within the last 10 years: No If all of the above answers are NO, then may proceed with Cephalosporin use.    Doxycycline  Itching       Latest Ref Rng & Units 09/06/2023    1:45 PM 10/10/2022   10:03 AM 08/16/2022   12:47 PM  CBC  WBC 4.0 - 10.5 K/uL 20.7  8.3  8.5   Hemoglobin 12.0 - 15.0 g/dL 88.2  86.2  86.5   Hematocrit 36.0 - 46.0 % 35.1  41.6  40.2   Platelets 150 - 400 K/uL 282  266  237       CMP     Component Value Date/Time   NA 132 (L) 09/06/2023 1345   NA 137 11/03/2019 1123   K 4.2 09/06/2023 1345   CL 98 09/06/2023 1345   CO2 23 09/06/2023 1345   GLUCOSE 118 (H) 09/06/2023 1345   BUN 21 09/06/2023 1345   BUN 9 11/03/2019 1123   CREATININE 1.06 (H) 09/06/2023 1345   CALCIUM  9.4 09/06/2023 1345   PROT 7.1 09/06/2023 1345   ALBUMIN 3.9 09/06/2023 1345   AST 18 09/06/2023 1345   ALT 14 09/06/2023 1345   ALKPHOS 45 09/06/2023 1345   BILITOT 1.5 (H) 09/06/2023 1345   GFRNONAA 57 (L) 09/06/2023 1345     No results found.     Assessment & Plan:   1. Lymphedema (Primary) Patient will continue Unna boot therapy to her left lower extremity for 4 more weeks.  She will then follow-up with a provider for recheck on that fifth week.  I also encouraged the patient to continue to elevate her extremities is much as possible.  I also encouraged patient to wear compression stockings or socks on her right lower extremity as well which she admits she has not been doing up to this point.   - Ambulatory referral to Home Health for unna boot wrap to her  left lower extremity.   2. Essential hypertension Continue antihypertensive medications as already ordered, these medications have been reviewed and there are no changes at this time.  3. Diabetes mellitus without complication (HCC) Continue hypoglycemic medications as already ordered, these medications have been reviewed and there are no changes at this time.  Hgb A1C to be monitored as already arranged by primary service  4. Morbid obesity due to excess calories (HCC) I did detailed 10-minute conversation with the patient today regarding her weight and how this can contribute to bilateral lower extremity swelling.  She continues to work on losing weight.   Current Outpatient Medications on File Prior to Visit  Medication Sig Dispense Refill   alendronate (FOSAMAX) 70 MG tablet Take 70 mg by mouth every Sunday.     atorvastatin  (LIPITOR ) 80 MG tablet Take 1 tablet (80 mg total) by mouth daily. 90 tablet 3   Cholecalciferol  (VITAMIN D ) 50 MCG (2000 UT) CAPS Take 2,000 Units by mouth daily.     ciprofloxacin  (CIPRO ) 250 MG  tablet Take 3 tablets (750 mg total) by mouth 2 (two) times daily. 60 tablet 0   clopidogrel  (PLAVIX ) 75 MG tablet Take 1 tablet (75 mg total) by mouth daily. 90 tablet 1   dofetilide  (TIKOSYN ) 250 MCG capsule TAKE 1 CAPSULE BY MOUTH TWICE A DAY 180 capsule 3   fluticasone  (FLONASE ) 50 MCG/ACT nasal spray Place 2 sprays into both nostrils daily as needed for rhinitis (congestion/ dryness).     glipiZIDE  (GLUCOTROL  XL) 5 MG 24 hr tablet Take 5 mg by mouth every morning.     JARDIANCE  10 MG TABS tablet Take 10 mg by mouth daily.     ketoconazole  (NIZORAL ) 2 % cream Apply 1 Application topically 2 (two) times daily.     Lancets (ONETOUCH DELICA PLUS LANCET33G) MISC 1 each daily.     losartan  (COZAAR ) 100 MG tablet Take 100 mg by mouth daily with breakfast.      MOUNJARO 10 MG/0.5ML Pen Inject 10 mg into the skin once a week.     nitroGLYCERIN  (NITROSTAT ) 0.4 MG SL tablet  Place 1 tablet (0.4 mg total) under the tongue every 5 (five) minutes as needed for chest pain. 30 tablet 0   potassium chloride  SA (KLOR-CON ) 20 MEQ tablet Take 1 tablet (20 mEq total) by mouth 2 (two) times daily.     rivaroxaban  (XARELTO ) 20 MG TABS tablet Take 1 tablet (20 mg total) by mouth daily with supper. 90 tablet 3   Skin Protectants, Misc. (EUCERIN) cream Apply 1 Application topically daily.     spironolactone  (ALDACTONE ) 25 MG tablet Take 25 mg by mouth daily with breakfast.      torsemide  (DEMADEX ) 20 MG tablet Take 60 mg by mouth daily after breakfast.      HYDROcodone -acetaminophen  (NORCO/VICODIN) 5-325 MG tablet Take 1 tablet by mouth every 6 (six) hours as needed for moderate pain (pain score 4-6). (Patient not taking: Reported on 10/20/2023) 30 tablet 0   methylPREDNISolone  (MEDROL  DOSEPAK) 4 MG TBPK tablet Take 1 tablet (4 mg total) by mouth as directed for 10 days, THEN 1 tablet (4 mg total) as directed for 10 days. (Patient not taking: No sig reported) 21 tablet 0   No current facility-administered medications on file prior to visit.    There are no Patient Instructions on file for this visit. No follow-ups on file.   Gwendlyn JONELLE Shank, NP

## 2023-10-23 ENCOUNTER — Telehealth (INDEPENDENT_AMBULATORY_CARE_PROVIDER_SITE_OTHER): Payer: Self-pay

## 2023-10-23 DIAGNOSIS — J4489 Other specified chronic obstructive pulmonary disease: Secondary | ICD-10-CM | POA: Diagnosis not present

## 2023-10-23 DIAGNOSIS — I350 Nonrheumatic aortic (valve) stenosis: Secondary | ICD-10-CM | POA: Diagnosis not present

## 2023-10-23 DIAGNOSIS — I10 Essential (primary) hypertension: Secondary | ICD-10-CM | POA: Diagnosis not present

## 2023-10-23 DIAGNOSIS — M1990 Unspecified osteoarthritis, unspecified site: Secondary | ICD-10-CM | POA: Diagnosis not present

## 2023-10-23 DIAGNOSIS — Z87440 Personal history of urinary (tract) infections: Secondary | ICD-10-CM | POA: Diagnosis not present

## 2023-10-23 DIAGNOSIS — I2510 Atherosclerotic heart disease of native coronary artery without angina pectoris: Secondary | ICD-10-CM | POA: Diagnosis not present

## 2023-10-23 DIAGNOSIS — E1151 Type 2 diabetes mellitus with diabetic peripheral angiopathy without gangrene: Secondary | ICD-10-CM | POA: Diagnosis not present

## 2023-10-23 DIAGNOSIS — D509 Iron deficiency anemia, unspecified: Secondary | ICD-10-CM | POA: Diagnosis not present

## 2023-10-23 DIAGNOSIS — Z85828 Personal history of other malignant neoplasm of skin: Secondary | ICD-10-CM | POA: Diagnosis not present

## 2023-10-23 DIAGNOSIS — M810 Age-related osteoporosis without current pathological fracture: Secondary | ICD-10-CM | POA: Diagnosis not present

## 2023-10-23 DIAGNOSIS — M75110 Incomplete rotator cuff tear or rupture of unspecified shoulder, not specified as traumatic: Secondary | ICD-10-CM | POA: Diagnosis not present

## 2023-10-23 DIAGNOSIS — I4819 Other persistent atrial fibrillation: Secondary | ICD-10-CM | POA: Diagnosis not present

## 2023-10-23 DIAGNOSIS — I890 Lymphedema, not elsewhere classified: Secondary | ICD-10-CM | POA: Diagnosis not present

## 2023-10-23 DIAGNOSIS — Z556 Problems related to health literacy: Secondary | ICD-10-CM | POA: Diagnosis not present

## 2023-10-23 DIAGNOSIS — E1169 Type 2 diabetes mellitus with other specified complication: Secondary | ICD-10-CM | POA: Diagnosis not present

## 2023-10-23 DIAGNOSIS — M47817 Spondylosis without myelopathy or radiculopathy, lumbosacral region: Secondary | ICD-10-CM | POA: Diagnosis not present

## 2023-10-23 DIAGNOSIS — K5730 Diverticulosis of large intestine without perforation or abscess without bleeding: Secondary | ICD-10-CM | POA: Diagnosis not present

## 2023-10-23 DIAGNOSIS — K219 Gastro-esophageal reflux disease without esophagitis: Secondary | ICD-10-CM | POA: Diagnosis not present

## 2023-10-23 DIAGNOSIS — I252 Old myocardial infarction: Secondary | ICD-10-CM | POA: Diagnosis not present

## 2023-10-23 DIAGNOSIS — E782 Mixed hyperlipidemia: Secondary | ICD-10-CM | POA: Diagnosis not present

## 2023-10-23 DIAGNOSIS — F17210 Nicotine dependence, cigarettes, uncomplicated: Secondary | ICD-10-CM | POA: Diagnosis not present

## 2023-10-23 DIAGNOSIS — I70229 Atherosclerosis of native arteries of extremities with rest pain, unspecified extremity: Secondary | ICD-10-CM | POA: Diagnosis not present

## 2023-10-23 DIAGNOSIS — H40013 Open angle with borderline findings, low risk, bilateral: Secondary | ICD-10-CM | POA: Diagnosis not present

## 2023-10-23 DIAGNOSIS — M5116 Intervertebral disc disorders with radiculopathy, lumbar region: Secondary | ICD-10-CM | POA: Diagnosis not present

## 2023-10-23 NOTE — Telephone Encounter (Signed)
 Patient daughter left a message stating that her mother is requesting for refill for pain medication for pain relief. Please Advise

## 2023-10-27 ENCOUNTER — Encounter (INDEPENDENT_AMBULATORY_CARE_PROVIDER_SITE_OTHER)

## 2023-10-27 DIAGNOSIS — Z556 Problems related to health literacy: Secondary | ICD-10-CM | POA: Diagnosis not present

## 2023-10-27 DIAGNOSIS — K5730 Diverticulosis of large intestine without perforation or abscess without bleeding: Secondary | ICD-10-CM | POA: Diagnosis not present

## 2023-10-27 DIAGNOSIS — J4489 Other specified chronic obstructive pulmonary disease: Secondary | ICD-10-CM | POA: Diagnosis not present

## 2023-10-27 DIAGNOSIS — I2510 Atherosclerotic heart disease of native coronary artery without angina pectoris: Secondary | ICD-10-CM | POA: Diagnosis not present

## 2023-10-27 DIAGNOSIS — E782 Mixed hyperlipidemia: Secondary | ICD-10-CM | POA: Diagnosis not present

## 2023-10-27 DIAGNOSIS — K219 Gastro-esophageal reflux disease without esophagitis: Secondary | ICD-10-CM | POA: Diagnosis not present

## 2023-10-27 DIAGNOSIS — M810 Age-related osteoporosis without current pathological fracture: Secondary | ICD-10-CM | POA: Diagnosis not present

## 2023-10-27 DIAGNOSIS — Z85828 Personal history of other malignant neoplasm of skin: Secondary | ICD-10-CM | POA: Diagnosis not present

## 2023-10-27 DIAGNOSIS — M75110 Incomplete rotator cuff tear or rupture of unspecified shoulder, not specified as traumatic: Secondary | ICD-10-CM | POA: Diagnosis not present

## 2023-10-27 DIAGNOSIS — H40013 Open angle with borderline findings, low risk, bilateral: Secondary | ICD-10-CM | POA: Diagnosis not present

## 2023-10-27 DIAGNOSIS — D509 Iron deficiency anemia, unspecified: Secondary | ICD-10-CM | POA: Diagnosis not present

## 2023-10-27 DIAGNOSIS — E1169 Type 2 diabetes mellitus with other specified complication: Secondary | ICD-10-CM | POA: Diagnosis not present

## 2023-10-27 DIAGNOSIS — I70229 Atherosclerosis of native arteries of extremities with rest pain, unspecified extremity: Secondary | ICD-10-CM | POA: Diagnosis not present

## 2023-10-27 DIAGNOSIS — Z87440 Personal history of urinary (tract) infections: Secondary | ICD-10-CM | POA: Diagnosis not present

## 2023-10-27 DIAGNOSIS — M1990 Unspecified osteoarthritis, unspecified site: Secondary | ICD-10-CM | POA: Diagnosis not present

## 2023-10-27 DIAGNOSIS — I890 Lymphedema, not elsewhere classified: Secondary | ICD-10-CM | POA: Diagnosis not present

## 2023-10-27 DIAGNOSIS — I10 Essential (primary) hypertension: Secondary | ICD-10-CM | POA: Diagnosis not present

## 2023-10-27 DIAGNOSIS — M47817 Spondylosis without myelopathy or radiculopathy, lumbosacral region: Secondary | ICD-10-CM | POA: Diagnosis not present

## 2023-10-27 DIAGNOSIS — M5116 Intervertebral disc disorders with radiculopathy, lumbar region: Secondary | ICD-10-CM | POA: Diagnosis not present

## 2023-10-27 DIAGNOSIS — E1151 Type 2 diabetes mellitus with diabetic peripheral angiopathy without gangrene: Secondary | ICD-10-CM | POA: Diagnosis not present

## 2023-10-27 DIAGNOSIS — I350 Nonrheumatic aortic (valve) stenosis: Secondary | ICD-10-CM | POA: Diagnosis not present

## 2023-10-27 DIAGNOSIS — F17210 Nicotine dependence, cigarettes, uncomplicated: Secondary | ICD-10-CM | POA: Diagnosis not present

## 2023-10-27 DIAGNOSIS — I252 Old myocardial infarction: Secondary | ICD-10-CM | POA: Diagnosis not present

## 2023-10-27 DIAGNOSIS — I4819 Other persistent atrial fibrillation: Secondary | ICD-10-CM | POA: Diagnosis not present

## 2023-11-03 ENCOUNTER — Encounter (INDEPENDENT_AMBULATORY_CARE_PROVIDER_SITE_OTHER)

## 2023-11-03 DIAGNOSIS — D509 Iron deficiency anemia, unspecified: Secondary | ICD-10-CM | POA: Diagnosis not present

## 2023-11-03 DIAGNOSIS — M1990 Unspecified osteoarthritis, unspecified site: Secondary | ICD-10-CM | POA: Diagnosis not present

## 2023-11-03 DIAGNOSIS — I10 Essential (primary) hypertension: Secondary | ICD-10-CM | POA: Diagnosis not present

## 2023-11-03 DIAGNOSIS — M47817 Spondylosis without myelopathy or radiculopathy, lumbosacral region: Secondary | ICD-10-CM | POA: Diagnosis not present

## 2023-11-03 DIAGNOSIS — E782 Mixed hyperlipidemia: Secondary | ICD-10-CM | POA: Diagnosis not present

## 2023-11-03 DIAGNOSIS — Z556 Problems related to health literacy: Secondary | ICD-10-CM | POA: Diagnosis not present

## 2023-11-03 DIAGNOSIS — J4489 Other specified chronic obstructive pulmonary disease: Secondary | ICD-10-CM | POA: Diagnosis not present

## 2023-11-03 DIAGNOSIS — K219 Gastro-esophageal reflux disease without esophagitis: Secondary | ICD-10-CM | POA: Diagnosis not present

## 2023-11-03 DIAGNOSIS — K5730 Diverticulosis of large intestine without perforation or abscess without bleeding: Secondary | ICD-10-CM | POA: Diagnosis not present

## 2023-11-03 DIAGNOSIS — H40013 Open angle with borderline findings, low risk, bilateral: Secondary | ICD-10-CM | POA: Diagnosis not present

## 2023-11-03 DIAGNOSIS — I4819 Other persistent atrial fibrillation: Secondary | ICD-10-CM | POA: Diagnosis not present

## 2023-11-03 DIAGNOSIS — E1151 Type 2 diabetes mellitus with diabetic peripheral angiopathy without gangrene: Secondary | ICD-10-CM | POA: Diagnosis not present

## 2023-11-03 DIAGNOSIS — I70229 Atherosclerosis of native arteries of extremities with rest pain, unspecified extremity: Secondary | ICD-10-CM | POA: Diagnosis not present

## 2023-11-03 DIAGNOSIS — M810 Age-related osteoporosis without current pathological fracture: Secondary | ICD-10-CM | POA: Diagnosis not present

## 2023-11-03 DIAGNOSIS — E1169 Type 2 diabetes mellitus with other specified complication: Secondary | ICD-10-CM | POA: Diagnosis not present

## 2023-11-03 DIAGNOSIS — I252 Old myocardial infarction: Secondary | ICD-10-CM | POA: Diagnosis not present

## 2023-11-03 DIAGNOSIS — I2510 Atherosclerotic heart disease of native coronary artery without angina pectoris: Secondary | ICD-10-CM | POA: Diagnosis not present

## 2023-11-03 DIAGNOSIS — F17210 Nicotine dependence, cigarettes, uncomplicated: Secondary | ICD-10-CM | POA: Diagnosis not present

## 2023-11-03 DIAGNOSIS — I890 Lymphedema, not elsewhere classified: Secondary | ICD-10-CM | POA: Diagnosis not present

## 2023-11-03 DIAGNOSIS — I350 Nonrheumatic aortic (valve) stenosis: Secondary | ICD-10-CM | POA: Diagnosis not present

## 2023-11-03 DIAGNOSIS — Z87440 Personal history of urinary (tract) infections: Secondary | ICD-10-CM | POA: Diagnosis not present

## 2023-11-03 DIAGNOSIS — M75110 Incomplete rotator cuff tear or rupture of unspecified shoulder, not specified as traumatic: Secondary | ICD-10-CM | POA: Diagnosis not present

## 2023-11-03 DIAGNOSIS — Z85828 Personal history of other malignant neoplasm of skin: Secondary | ICD-10-CM | POA: Diagnosis not present

## 2023-11-03 DIAGNOSIS — M5116 Intervertebral disc disorders with radiculopathy, lumbar region: Secondary | ICD-10-CM | POA: Diagnosis not present

## 2023-11-10 ENCOUNTER — Encounter (INDEPENDENT_AMBULATORY_CARE_PROVIDER_SITE_OTHER)

## 2023-11-10 DIAGNOSIS — K5730 Diverticulosis of large intestine without perforation or abscess without bleeding: Secondary | ICD-10-CM | POA: Diagnosis not present

## 2023-11-10 DIAGNOSIS — D509 Iron deficiency anemia, unspecified: Secondary | ICD-10-CM | POA: Diagnosis not present

## 2023-11-10 DIAGNOSIS — M75110 Incomplete rotator cuff tear or rupture of unspecified shoulder, not specified as traumatic: Secondary | ICD-10-CM | POA: Diagnosis not present

## 2023-11-10 DIAGNOSIS — E782 Mixed hyperlipidemia: Secondary | ICD-10-CM | POA: Diagnosis not present

## 2023-11-10 DIAGNOSIS — E1169 Type 2 diabetes mellitus with other specified complication: Secondary | ICD-10-CM | POA: Diagnosis not present

## 2023-11-10 DIAGNOSIS — M47817 Spondylosis without myelopathy or radiculopathy, lumbosacral region: Secondary | ICD-10-CM | POA: Diagnosis not present

## 2023-11-10 DIAGNOSIS — I70229 Atherosclerosis of native arteries of extremities with rest pain, unspecified extremity: Secondary | ICD-10-CM | POA: Diagnosis not present

## 2023-11-10 DIAGNOSIS — M5116 Intervertebral disc disorders with radiculopathy, lumbar region: Secondary | ICD-10-CM | POA: Diagnosis not present

## 2023-11-10 DIAGNOSIS — K219 Gastro-esophageal reflux disease without esophagitis: Secondary | ICD-10-CM | POA: Diagnosis not present

## 2023-11-10 DIAGNOSIS — I890 Lymphedema, not elsewhere classified: Secondary | ICD-10-CM | POA: Diagnosis not present

## 2023-11-10 DIAGNOSIS — E1151 Type 2 diabetes mellitus with diabetic peripheral angiopathy without gangrene: Secondary | ICD-10-CM | POA: Diagnosis not present

## 2023-11-10 DIAGNOSIS — F17210 Nicotine dependence, cigarettes, uncomplicated: Secondary | ICD-10-CM | POA: Diagnosis not present

## 2023-11-10 DIAGNOSIS — I10 Essential (primary) hypertension: Secondary | ICD-10-CM | POA: Diagnosis not present

## 2023-11-10 DIAGNOSIS — H40013 Open angle with borderline findings, low risk, bilateral: Secondary | ICD-10-CM | POA: Diagnosis not present

## 2023-11-10 DIAGNOSIS — I4819 Other persistent atrial fibrillation: Secondary | ICD-10-CM | POA: Diagnosis not present

## 2023-11-10 DIAGNOSIS — Z556 Problems related to health literacy: Secondary | ICD-10-CM | POA: Diagnosis not present

## 2023-11-10 DIAGNOSIS — J4489 Other specified chronic obstructive pulmonary disease: Secondary | ICD-10-CM | POA: Diagnosis not present

## 2023-11-10 DIAGNOSIS — M810 Age-related osteoporosis without current pathological fracture: Secondary | ICD-10-CM | POA: Diagnosis not present

## 2023-11-10 DIAGNOSIS — Z87440 Personal history of urinary (tract) infections: Secondary | ICD-10-CM | POA: Diagnosis not present

## 2023-11-10 DIAGNOSIS — I350 Nonrheumatic aortic (valve) stenosis: Secondary | ICD-10-CM | POA: Diagnosis not present

## 2023-11-10 DIAGNOSIS — Z85828 Personal history of other malignant neoplasm of skin: Secondary | ICD-10-CM | POA: Diagnosis not present

## 2023-11-10 DIAGNOSIS — I2510 Atherosclerotic heart disease of native coronary artery without angina pectoris: Secondary | ICD-10-CM | POA: Diagnosis not present

## 2023-11-10 DIAGNOSIS — I252 Old myocardial infarction: Secondary | ICD-10-CM | POA: Diagnosis not present

## 2023-11-10 DIAGNOSIS — M1990 Unspecified osteoarthritis, unspecified site: Secondary | ICD-10-CM | POA: Diagnosis not present

## 2023-11-17 ENCOUNTER — Encounter (INDEPENDENT_AMBULATORY_CARE_PROVIDER_SITE_OTHER)

## 2023-11-17 DIAGNOSIS — E1169 Type 2 diabetes mellitus with other specified complication: Secondary | ICD-10-CM | POA: Diagnosis not present

## 2023-11-17 DIAGNOSIS — Z85828 Personal history of other malignant neoplasm of skin: Secondary | ICD-10-CM | POA: Diagnosis not present

## 2023-11-17 DIAGNOSIS — M75110 Incomplete rotator cuff tear or rupture of unspecified shoulder, not specified as traumatic: Secondary | ICD-10-CM | POA: Diagnosis not present

## 2023-11-17 DIAGNOSIS — E1151 Type 2 diabetes mellitus with diabetic peripheral angiopathy without gangrene: Secondary | ICD-10-CM | POA: Diagnosis not present

## 2023-11-17 DIAGNOSIS — I70229 Atherosclerosis of native arteries of extremities with rest pain, unspecified extremity: Secondary | ICD-10-CM | POA: Diagnosis not present

## 2023-11-17 DIAGNOSIS — I2510 Atherosclerotic heart disease of native coronary artery without angina pectoris: Secondary | ICD-10-CM | POA: Diagnosis not present

## 2023-11-17 DIAGNOSIS — K219 Gastro-esophageal reflux disease without esophagitis: Secondary | ICD-10-CM | POA: Diagnosis not present

## 2023-11-17 DIAGNOSIS — I890 Lymphedema, not elsewhere classified: Secondary | ICD-10-CM | POA: Diagnosis not present

## 2023-11-17 DIAGNOSIS — Z556 Problems related to health literacy: Secondary | ICD-10-CM | POA: Diagnosis not present

## 2023-11-17 DIAGNOSIS — M5116 Intervertebral disc disorders with radiculopathy, lumbar region: Secondary | ICD-10-CM | POA: Diagnosis not present

## 2023-11-17 DIAGNOSIS — Z87440 Personal history of urinary (tract) infections: Secondary | ICD-10-CM | POA: Diagnosis not present

## 2023-11-17 DIAGNOSIS — M1990 Unspecified osteoarthritis, unspecified site: Secondary | ICD-10-CM | POA: Diagnosis not present

## 2023-11-17 DIAGNOSIS — M810 Age-related osteoporosis without current pathological fracture: Secondary | ICD-10-CM | POA: Diagnosis not present

## 2023-11-17 DIAGNOSIS — I4819 Other persistent atrial fibrillation: Secondary | ICD-10-CM | POA: Diagnosis not present

## 2023-11-17 DIAGNOSIS — M47817 Spondylosis without myelopathy or radiculopathy, lumbosacral region: Secondary | ICD-10-CM | POA: Diagnosis not present

## 2023-11-17 DIAGNOSIS — K5730 Diverticulosis of large intestine without perforation or abscess without bleeding: Secondary | ICD-10-CM | POA: Diagnosis not present

## 2023-11-17 DIAGNOSIS — D509 Iron deficiency anemia, unspecified: Secondary | ICD-10-CM | POA: Diagnosis not present

## 2023-11-17 DIAGNOSIS — I10 Essential (primary) hypertension: Secondary | ICD-10-CM | POA: Diagnosis not present

## 2023-11-17 DIAGNOSIS — I350 Nonrheumatic aortic (valve) stenosis: Secondary | ICD-10-CM | POA: Diagnosis not present

## 2023-11-17 DIAGNOSIS — J4489 Other specified chronic obstructive pulmonary disease: Secondary | ICD-10-CM | POA: Diagnosis not present

## 2023-11-17 DIAGNOSIS — F17210 Nicotine dependence, cigarettes, uncomplicated: Secondary | ICD-10-CM | POA: Diagnosis not present

## 2023-11-17 DIAGNOSIS — H40013 Open angle with borderline findings, low risk, bilateral: Secondary | ICD-10-CM | POA: Diagnosis not present

## 2023-11-17 DIAGNOSIS — I252 Old myocardial infarction: Secondary | ICD-10-CM | POA: Diagnosis not present

## 2023-11-17 DIAGNOSIS — E782 Mixed hyperlipidemia: Secondary | ICD-10-CM | POA: Diagnosis not present

## 2023-11-24 ENCOUNTER — Ambulatory Visit (INDEPENDENT_AMBULATORY_CARE_PROVIDER_SITE_OTHER): Admitting: Vascular Surgery

## 2023-11-24 ENCOUNTER — Encounter (INDEPENDENT_AMBULATORY_CARE_PROVIDER_SITE_OTHER): Payer: Self-pay | Admitting: Vascular Surgery

## 2023-11-24 VITALS — BP 160/72 | HR 53 | Resp 18 | Wt 201.6 lb

## 2023-11-24 DIAGNOSIS — I10 Essential (primary) hypertension: Secondary | ICD-10-CM

## 2023-11-24 DIAGNOSIS — I890 Lymphedema, not elsewhere classified: Secondary | ICD-10-CM | POA: Diagnosis not present

## 2023-11-24 DIAGNOSIS — E119 Type 2 diabetes mellitus without complications: Secondary | ICD-10-CM | POA: Diagnosis not present

## 2023-11-24 NOTE — Progress Notes (Signed)
 Subjective:    Patient ID: Amy Tran, female    DOB: 21-Jul-1955, 68 y.o.   MRN: [id number] Chief Complaint  Patient presents with   Follow-up    Unna follow up    Amy Tran is a 68 yo morbidly obese female who returns to clinic today for follow-up of bilateral lower extremity lymphedema after being in Unna boot wraps for 4 weeks.  Patient still complains of difficulties with her bilateral lower extremities.  Bilateral lower extremity still remains discolored and swollen.  She has +2 edema from mid calf to her toes bilaterally.  She complains of pain throbbing and aching with myalgias due to the swelling.  Patient endorses trying to walk is much as she can.    Review of Systems  Cardiovascular:  Positive for leg swelling.  Musculoskeletal:  Positive for myalgias.  Skin:  Positive for color change.       Objective:   Physical Exam Constitutional:      Appearance: Normal appearance. She is obese.  HENT:     Head: Normocephalic.  Eyes:     Pupils: Pupils are equal, round, and reactive to light.  Cardiovascular:     Rate and Rhythm: Normal rate and regular rhythm.     Pulses: Normal pulses.     Heart sounds: Normal heart sounds.  Pulmonary:     Effort: Pulmonary effort is normal.     Breath sounds: Normal breath sounds.  Abdominal:     General: Bowel sounds are normal.     Palpations: Abdomen is soft.  Musculoskeletal:        General: Tenderness present.     Right lower leg: Edema present.     Left lower leg: Edema present.  Skin:    General: Skin is warm and dry.     Capillary Refill: Capillary refill takes 2 to 3 seconds.  Neurological:     General: No focal deficit present.     Mental Status: She is alert and oriented to person, place, and time. Mental status is at baseline.  Psychiatric:        Mood and Affect: Mood normal.        Behavior: Behavior normal.        Thought Content: Thought content normal.        Judgment: Judgment normal.     BP  (!) 160/72   Pulse (!) 53   Resp 18   Wt 201 lb 9.6 oz (91.4 kg)   BMI 34.60 kg/m   Past Medical History:  Diagnosis Date   Aortic stenosis    Arthritis    Asthma    Cancer (HCC)    multiple skin cancers   Coronary artery disease    Diabetes mellitus without complication (HCC)    Diverticulosis    Dysrhythmia    Fluid retention    GERD (gastroesophageal reflux disease)    High cholesterol    Hyperlipidemia    Hypertension    Incomplete rotator cuff tear    Myocardial infarction (HCC)    Obesity    Osteoporosis    PAD (peripheral artery disease)    Persistent atrial fibrillation (HCC)    PONV (postoperative nausea and vomiting)    Wears glasses    Wears partial dentures    bottom    Social History   Socioeconomic History   Marital status: Widowed    Spouse name: Not on file   Number of children: 2   Years of education: Not  on file   Highest education level: Not on file  Occupational History   Occupation: works at [employer] store    Employer: [employer]   Tobacco Use   Smoking status: Every Day    Current packs/day: 1.50    Average packs/day: 1.5 packs/day for 53.6 years (80.4 ttl pk-yrs)    Types: Cigarettes    Start date: 04/09/1970   Smokeless tobacco: Never   Tobacco comments:    09/04/20 - states she is down to 9 ciggs / day   Vaping Use   Vaping status: Never Used  Substance and Sexual Activity   Alcohol use: Not Currently   Drug use: No   Sexual activity: Not Currently    Birth control/protection: Post-menopausal  Other Topics Concern   Not on file  Social History Narrative   Not on file   Social Drivers of Health   Financial Resource Strain: Low Risk  (10/01/2023)   Received from [facility]   Overall Financial Resource Strain (CARDIA)    Difficulty of Paying Living Expenses: Not hard at all  Food Insecurity: No Food Insecurity (10/01/2023)   Received from [facility]   Hunger Vital Sign    Within the past 12 months,  you worried that your food would run out before you got the money to buy more.: Never true    Within the past 12 months, the food you bought just didn't last and you didn't have money to get more.: Never true  Transportation Needs: No Transportation Needs (10/01/2023)   Received from [facility] - Transportation    In the past 12 months, has lack of transportation kept you from medical appointments or from getting medications?: No    Lack of Transportation (Non-Medical): No  Physical Activity: Unknown (09/15/2017)   Exercise Vital Sign    Days of Exercise per Week: Patient declined    Minutes of Exercise per Session: Patient declined  Stress: Unknown (09/15/2017)   [employer] [facility] [employer] - Occupational Stress Questionnaire    Feeling of Stress : Patient declined  Social Connections: Unknown (09/15/2017)   Social Connection and Isolation Panel    Frequency of Communication with Friends and Family: Patient declined    Frequency of Social Gatherings with Friends and Family: Patient declined    Attends Religious Services: Patient declined    Active Member of Clubs or Organizations: Patient declined    Attends Banker Meetings: Patient declined    Marital Status: Patient declined  Intimate Partner Violence: Unknown (09/15/2017)   Humiliation, Afraid, Rape, and Kick questionnaire    Fear of Current or Ex-Partner: Patient declined    Emotionally Abused: Patient declined    Physically Abused: Patient declined    Sexually Abused: Patient declined    Past Surgical History:  Procedure Laterality Date   APPENDECTOMY     CARDIOVERSION N/A 06/03/2019   Procedure: CARDIOVERSION;  Surgeon: Charls Pearla LABOR, MD;  Location: [facility];  Service: Cardiovascular;  Laterality: N/A;   CARDIOVERSION N/A 07/13/2019   Procedure: CARDIOVERSION;  Surgeon: Barbaraann Darryle Ned, MD;  Location: [facility] ENDOSCOPY;  Service: Cardiovascular;  Laterality: N/A;   CARPAL  TUNNEL RELEASE Bilateral 2001   bilateral   CERVICAL POLYPECTOMY  04/08/2017   Procedure: POLYPECTOMY;  Surgeon: Edsel Norleen GAILS, MD;  Location: [facility];  Service: Gynecology;;  Endometrial   CHOLECYSTECTOMY     COLONOSCOPY  2007   MFM:[id number] internal hemorrhoids. Diminutive rectal polyp at  10 cm, cold biopsied/removed. The remainder of the rectal mucosa appeared normal Swallow left-sided diverticula. Diminutive polyp at the splenic flexure cold biopsied/removed (adenomatous)   COLONOSCOPY N/A 12/28/2013   Procedure: COLONOSCOPY;  Surgeon: Lamar CHRISTELLA Hollingshead, MD;  Location: [facility] SU[facility];  Service: Endoscopy;  Laterality: N/A;  730 - moved to 8:30 - Ginger notified pt   CORONARY STENT INTERVENTION N/A 04/27/2019   Procedure: CORONARY STENT INTERVENTION;  Surgeon: Burnard Debby LABOR, MD;  Location: [facility] INVASIVE CV LAB;  Service: Cardiovascular;  Laterality: N/A;   CORONARY ULTRASOUND/IVUS N/A 09/12/2017   Procedure: INTRAVASCULAR ULTRASOUND/IVUS;  Surgeon: Harvey Carlin BRAVO, MD;  Location: [facility] INVASIVE CV LAB;  Service: Cardiovascular;  Laterality: N/A;   DILATION AND CURETTAGE, DIAGNOSTIC / THERAPEUTIC  03/07/2017   ERCP  2002   GASTRIC BYPASS  2004   HYSTEROSCOPY WITH D & C N/A 04/08/2017   Procedure: DILATATION AND CURETTAGE /HYSTEROSCOPY;  Surgeon: Edsel Norleen GAILS, MD;  Location: [facility];  Service: Gynecology;  Laterality: N/A;   LEFT HEART CATH AND CORONARY ANGIOGRAPHY N/A 04/27/2019   Procedure: LEFT HEART CATH AND CORONARY ANGIOGRAPHY;  Surgeon: Burnard Debby LABOR, MD;  Location: [facility] CV LAB;  Service: Cardiovascular;  Laterality: N/A;   LOWER EXTREMITY ANGIOGRAPHY Left 10/02/2020   Procedure: LOWER EXTREMITY ANGIOGRAPHY;  Surgeon: Marea Selinda RAMAN, MD;  Location: [facility] INVASIVE CV LAB;  Service: Cardiovascular;  Laterality: Left;   LUMBAR LAMINECTOMY/DECOMPRESSION MICRODISCECTOMY Right 03/15/2022   Procedure: Microdiscectomy - right - Lumbar Four-Lumbar Five extraforaminal;  Surgeon: Louis Shove, MD;   Location: [facility] OR;  Service: Neurosurgery;  Laterality: Right;   NECK SURGERY  1998   fusion   PERCUTANEOUS VENOUS THROMBECTOMY,LYSIS WITH INTRAVASCULAR ULTRASOUND (IVUS) Left 09/13/2017   Procedure: REMOVAL OF LYSIS CATHETER AND INTRAVASCULAR ULTRASOUND (IVUS) LEFT ILIAC VEIN;  Surgeon: Harvey Carlin BRAVO, MD;  Location: [facility] OR;  Service: Vascular;  Laterality: Left;   PERIPHERAL VASCULAR INTERVENTION  09/15/2017   Procedure: PERIPHERAL VASCULAR INTERVENTION;  Surgeon: Sheree Penne Bruckner, MD;  Location: [facility] INVASIVE CV LAB;  Service: Cardiovascular;;  VEINOUS/STENT   PERIPHERAL VASCULAR THROMBECTOMY Left 09/12/2017   Procedure: PERIPHERAL VASCULAR THROMBECTOMY;  Surgeon: Harvey Carlin BRAVO, MD;  Location: [facility] LAB;  Service: Cardiovascular;  Laterality: Left;   PERIPHERAL VASCULAR THROMBECTOMY Left 09/15/2017   Procedure: PERIPHERAL VASCULAR THROMBECTOMY;  Surgeon: Sheree Penne Bruckner, MD;  Location: [facility] INVASIVE CV LAB;  Service: Cardiovascular;  Laterality: Left;  IVC TO LT FEM/POP VEIN   SHOULDER ARTHROSCOPY WITH SUBACROMIAL DECOMPRESSION Left 06/23/2013   Procedure: LEFT SHOULDER ARTHROSCOPY WITH DEBRIDEMENT ROTATOR CUFF AND LABRUM;  Surgeon: Lamar LABOR Millman, MD;  Location: [facility];  Service: Orthopedics;  Laterality: Left;   SHOULDER SURGERY  1999   left    Family History  Problem Relation Age of Onset   Diabetes Father    Hypertension Father    Parkinson's disease Father    Cancer Father        not sure what kind   Hypertension Mother    Heart disease Mother    Drug abuse Sister    Mesothelioma Brother    Cancer Daughter        non-hodgkins lmphoma   Hypertension Brother    Diabetes Brother    Colon cancer Neg Hx    Gastric cancer Neg Hx    Esophageal cancer Neg Hx     Allergies  Allergen Reactions   Penicillins Hives and Rash    Has patient had a PCN  reaction causing immediate rash, facial/tongue/throat swelling, SOB or lightheadedness with  hypotension: Yes Has patient had a PCN reaction causing severe rash involving mucus membranes or skin necrosis: No Has patient had a PCN reaction that required hospitalization No Has patient had a PCN reaction occurring within the last 10 years: No If all of the above answers are NO, then may proceed with Cephalosporin use.    Doxycycline  Itching       Latest Ref Rng & Units 09/06/2023    1:45 PM 10/10/2022   10:03 AM 08/16/2022   12:47 PM  CBC  WBC 4.0 - 10.5 K/uL 20.7  8.3  8.5   Hemoglobin 12.0 - 15.0 g/dL 88.2  86.2  86.5   Hematocrit 36.0 - 46.0 % 35.1  41.6  40.2   Platelets 150 - 400 K/uL 282  266  237       CMP     Component Value Date/Time   NA 132 (L) 09/06/2023 1345   NA 137 11/03/2019 1123   K 4.2 09/06/2023 1345   CL 98 09/06/2023 1345   CO2 23 09/06/2023 1345   GLUCOSE 118 (H) 09/06/2023 1345   BUN 21 09/06/2023 1345   BUN 9 11/03/2019 1123   CREATININE 1.06 (H) 09/06/2023 1345   CALCIUM  9.4 09/06/2023 1345   PROT 7.1 09/06/2023 1345   ALBUMIN 3.9 09/06/2023 1345   AST 18 09/06/2023 1345   ALT 14 09/06/2023 1345   ALKPHOS 45 09/06/2023 1345   BILITOT 1.5 (H) 09/06/2023 1345   GFRNONAA 57 (L) 09/06/2023 1345     No results found.     Assessment & Plan:   1. Lymphedema (Primary) Patient is currently had Unna boot wraps for the last 4 weeks with follow-up today with a provider for evaluation.  Patient continues to still have lymphedema to bilateral lower extremities significant from mid calf to her toes.  She does have toe swelling foot swelling as well.  Therefore vascular surgery recommends the patient continue another 4 weeks of Unna boot wraps.  She will follow-up with a provider in a week 5.  Our hope is patient can be placed back into compression socks at this time for continued control of her bilateral lower extremity lymphedema.  2. Essential hypertension Continue antihypertensive medications as already ordered, these medications have been  reviewed and there are no changes at this time.  3. Diabetes mellitus without complication (HCC) Continue hypoglycemic medications as already ordered, these medications have been reviewed and there are no changes at this time.  Hgb A1C to be monitored as already arranged by primary service  4. Morbid obesity due to excess calories (HCC) I had a 15-minute conversation this morning regarding the patient's morbid obesity and how relates to her bilateral lower extremity leg swelling.  I recommend weight loss to be able to help bilateral lower extremity lymphedema she currently has.   Current Outpatient Medications on File Prior to Visit  Medication Sig Dispense Refill   alendronate (FOSAMAX) 70 MG tablet Take 70 mg by mouth every Sunday.     atorvastatin  (LIPITOR ) 80 MG tablet Take 1 tablet (80 mg total) by mouth daily. 90 tablet 3   Cholecalciferol  (VITAMIN D ) 50 MCG (2000 UT) CAPS Take 2,000 Units by mouth daily.     ciprofloxacin  (CIPRO ) 250 MG tablet Take 3 tablets (750 mg total) by mouth 2 (two) times daily. 60 tablet 0   clopidogrel  (PLAVIX ) 75 MG tablet Take 1 tablet (75 mg total) by mouth  daily. 90 tablet 1   dofetilide  (TIKOSYN ) 250 MCG capsule TAKE 1 CAPSULE BY MOUTH TWICE A DAY 180 capsule 3   fluticasone  (FLONASE ) 50 MCG/ACT nasal spray Place 2 sprays into both nostrils daily as needed for rhinitis (congestion/ dryness).     glipiZIDE  (GLUCOTROL  XL) 5 MG 24 hr tablet Take 5 mg by mouth every morning.     JARDIANCE  10 MG TABS tablet Take 10 mg by mouth daily.     ketoconazole  (NIZORAL ) 2 % cream Apply 1 Application topically 2 (two) times daily.     Lancets (ONETOUCH DELICA PLUS LANCET33G) MISC 1 each daily.     losartan  (COZAAR ) 100 MG tablet Take 100 mg by mouth daily with breakfast.      MOUNJARO 10 MG/0.5ML Pen Inject 10 mg into the skin once a week.     nitroGLYCERIN  (NITROSTAT ) 0.4 MG SL tablet Place 1 tablet (0.4 mg total) under the tongue every 5 (five) minutes as needed for  chest pain. 30 tablet 0   potassium chloride  SA (KLOR-CON ) 20 MEQ tablet Take 1 tablet (20 mEq total) by mouth 2 (two) times daily.     rivaroxaban  (XARELTO ) 20 MG TABS tablet Take 1 tablet (20 mg total) by mouth daily with supper. 90 tablet 3   Skin Protectants, Misc. (EUCERIN) cream Apply 1 Application topically daily.     spironolactone  (ALDACTONE ) 25 MG tablet Take 25 mg by mouth daily with breakfast.      torsemide  (DEMADEX ) 20 MG tablet Take 60 mg by mouth daily after breakfast.      HYDROcodone -acetaminophen  (NORCO/VICODIN) 5-325 MG tablet Take 1 tablet by mouth every 6 (six) hours as needed for moderate pain (pain score 4-6). (Patient not taking: Reported on 11/24/2023) 30 tablet 0   No current facility-administered medications on file prior to visit.    There are no Patient Instructions on file for this visit. No follow-ups on file.   Amy JONELLE Shank, NP

## 2023-12-02 ENCOUNTER — Encounter (INDEPENDENT_AMBULATORY_CARE_PROVIDER_SITE_OTHER)

## 2023-12-04 ENCOUNTER — Encounter (INDEPENDENT_AMBULATORY_CARE_PROVIDER_SITE_OTHER)

## 2023-12-04 ENCOUNTER — Encounter (INDEPENDENT_AMBULATORY_CARE_PROVIDER_SITE_OTHER): Payer: Self-pay

## 2023-12-09 ENCOUNTER — Encounter (INDEPENDENT_AMBULATORY_CARE_PROVIDER_SITE_OTHER): Payer: Self-pay

## 2023-12-09 ENCOUNTER — Encounter (INDEPENDENT_AMBULATORY_CARE_PROVIDER_SITE_OTHER)

## 2023-12-10 DIAGNOSIS — E1129 Type 2 diabetes mellitus with other diabetic kidney complication: Secondary | ICD-10-CM | POA: Diagnosis not present

## 2023-12-10 DIAGNOSIS — E785 Hyperlipidemia, unspecified: Secondary | ICD-10-CM | POA: Diagnosis not present

## 2023-12-10 DIAGNOSIS — E876 Hypokalemia: Secondary | ICD-10-CM | POA: Diagnosis not present

## 2023-12-10 DIAGNOSIS — I10 Essential (primary) hypertension: Secondary | ICD-10-CM | POA: Diagnosis not present

## 2023-12-16 ENCOUNTER — Encounter (INDEPENDENT_AMBULATORY_CARE_PROVIDER_SITE_OTHER): Payer: Self-pay

## 2023-12-16 ENCOUNTER — Ambulatory Visit (INDEPENDENT_AMBULATORY_CARE_PROVIDER_SITE_OTHER): Admitting: Nurse Practitioner

## 2023-12-16 VITALS — BP 164/78 | HR 50 | Resp 18 | Wt 204.8 lb

## 2023-12-16 DIAGNOSIS — I890 Lymphedema, not elsewhere classified: Secondary | ICD-10-CM

## 2023-12-16 NOTE — Progress Notes (Signed)
 History of Present Illness  There is no documented history at this time  Assessments & Plan   There are no diagnoses linked to this encounter.    Additional instructions  Subjective:  Patient presents with venous ulcer of the Bilateral lower extremity.    Procedure:  3 layer unna wrap was placed Bilateral lower extremity.   Plan:   Follow up in one week.

## 2023-12-17 ENCOUNTER — Other Ambulatory Visit (HOSPITAL_COMMUNITY): Payer: Self-pay | Admitting: Internal Medicine

## 2023-12-17 DIAGNOSIS — Z1231 Encounter for screening mammogram for malignant neoplasm of breast: Secondary | ICD-10-CM

## 2023-12-19 ENCOUNTER — Telehealth (INDEPENDENT_AMBULATORY_CARE_PROVIDER_SITE_OTHER): Payer: Self-pay

## 2023-12-19 NOTE — Telephone Encounter (Signed)
 Rest less legs are not a vascualr issue.  I would advise she visit her PCP

## 2023-12-19 NOTE — Telephone Encounter (Signed)
 Patient called at this time in reference to discomfort, jumping, and restlessness in her bilateral legs at this time, patient requesting advice or if anything could be called in for her at this time. Please advise.

## 2023-12-19 NOTE — Telephone Encounter (Signed)
 Spoke with patient at this time and she verbalized understanding to contact her PCP for her legs.

## 2023-12-22 ENCOUNTER — Encounter (INDEPENDENT_AMBULATORY_CARE_PROVIDER_SITE_OTHER): Payer: Self-pay | Admitting: Nurse Practitioner

## 2023-12-22 ENCOUNTER — Encounter (INDEPENDENT_AMBULATORY_CARE_PROVIDER_SITE_OTHER): Payer: Self-pay | Admitting: *Deleted

## 2023-12-23 ENCOUNTER — Encounter (INDEPENDENT_AMBULATORY_CARE_PROVIDER_SITE_OTHER)

## 2023-12-24 ENCOUNTER — Encounter (INDEPENDENT_AMBULATORY_CARE_PROVIDER_SITE_OTHER)

## 2023-12-24 ENCOUNTER — Encounter (INDEPENDENT_AMBULATORY_CARE_PROVIDER_SITE_OTHER): Payer: Self-pay

## 2023-12-30 ENCOUNTER — Encounter (INDEPENDENT_AMBULATORY_CARE_PROVIDER_SITE_OTHER): Payer: Self-pay | Admitting: Nurse Practitioner

## 2023-12-30 ENCOUNTER — Ambulatory Visit (INDEPENDENT_AMBULATORY_CARE_PROVIDER_SITE_OTHER): Admitting: Nurse Practitioner

## 2023-12-30 VITALS — BP 127/50 | HR 52 | Resp 16 | Ht 64.0 in | Wt 205.6 lb

## 2023-12-30 DIAGNOSIS — M5416 Radiculopathy, lumbar region: Secondary | ICD-10-CM | POA: Diagnosis not present

## 2023-12-30 DIAGNOSIS — G2581 Restless legs syndrome: Secondary | ICD-10-CM | POA: Diagnosis not present

## 2023-12-30 DIAGNOSIS — I890 Lymphedema, not elsewhere classified: Secondary | ICD-10-CM | POA: Diagnosis not present

## 2023-12-30 DIAGNOSIS — I10 Essential (primary) hypertension: Secondary | ICD-10-CM

## 2023-12-30 NOTE — Addendum Note (Signed)
 Addended by: DELORES ORVIN BRAVO on: 12/30/2023 10:02 AM   Modules accepted: Orders

## 2023-12-30 NOTE — Progress Notes (Signed)
 SUBJECTIVE:  Patient ID: Amy Tran, female    DOB: June 09, 1955, 68 y.o.   MRN: [id number] Chief Complaint  Patient presents with   Follow-up    fu unna     Discussed the use of AI scribe software for clinical note transcription with the patient, who gave verbal consent to proceed.  History of Present Illness Amy Tran is a 68 year old female who presents for evaluation of compression therapy for chronic leg swelling.  She has been using Unaboots for her leg swelling but has been out of them for three days without significant worsening in her symptoms. She has been wearing extra-large compression socks, which do not slide down but are not very tight, potentially affecting their efficacy. She has not been using her compression pump since her legs were wrapped, although she owns one and is familiar with its use.  She experiences pain and 'jumping' sensations in her legs at night. She has a history of nerve damage in one leg and uses a spinal stimulator for this condition. She previously contacted her neurologist in [place] regarding these symptoms but was advised to speak with her primary physician, who prescribed sleeping medication.  Her calf sizes are different, which may require different size compression socks.     Results    Past Medical History:  Diagnosis Date   Aortic stenosis    Arthritis    Asthma    Cancer (HCC)    multiple skin cancers   Coronary artery disease    Diabetes mellitus without complication (HCC)    Diverticulosis    Dysrhythmia    Fluid retention    GERD (gastroesophageal reflux disease)    High cholesterol    Hyperlipidemia    Hypertension    Incomplete rotator cuff tear    Myocardial infarction (HCC)    Obesity    Osteoporosis    PAD (peripheral artery disease)    Persistent atrial fibrillation (HCC)    PONV (postoperative nausea and vomiting)    Wears glasses    Wears partial dentures    bottom    Past Surgical  History:  Procedure Laterality Date   APPENDECTOMY     CARDIOVERSION N/A 06/03/2019   Procedure: CARDIOVERSION;  Surgeon: Charls Pearla LABOR, MD;  Location: [facility];  Service: Cardiovascular;  Laterality: N/A;   CARDIOVERSION N/A 07/13/2019   Procedure: CARDIOVERSION;  Surgeon: Barbaraann Darryle Ned, MD;  Location: [facility] ENDOSCOPY;  Service: Cardiovascular;  Laterality: N/A;   CARPAL TUNNEL RELEASE Bilateral 2001   bilateral   CERVICAL POLYPECTOMY  04/08/2017   Procedure: POLYPECTOMY;  Surgeon: Edsel Norleen GAILS, MD;  Location: [facility];  Service: Gynecology;;  Endometrial   CHOLECYSTECTOMY     COLONOSCOPY  2007   MFM:[id number] internal hemorrhoids. Diminutive rectal polyp at 10 cm, cold biopsied/removed. The remainder of the rectal mucosa appeared normal Swallow left-sided diverticula. Diminutive polyp at the splenic flexure cold biopsied/removed (adenomatous)   COLONOSCOPY N/A 12/28/2013   Procedure: COLONOSCOPY;  Surgeon: Lamar CHRISTELLA Hollingshead, MD;  Location: [facility];  Service: Endoscopy;  Laterality: N/A;  730 - moved to 8:30 - Ginger notified pt   CORONARY STENT INTERVENTION N/A 04/27/2019   Procedure: CORONARY STENT INTERVENTION;  Surgeon: Burnard Ned LABOR, MD;  Location: [facility] IN[facility]ASIVE CV LAB;  Service: Cardiovascular;  Laterality: N/A;   CORONARY ULTRASOUND/IVUS N/A 09/12/2017   Procedure: INTRAVASCULAR ULTRASOUND/IVUS;  Surgeon: Harvey Carlin BRAVO, MD;  Location: [facility];  Service: Cardiovascular;  Laterality: N/A;  DILATION AND CURETTAGE, DIAGNOSTIC / THERAPEUTIC  03/07/2017   ERCP  2002   GASTRIC BYPASS  2004   HYSTEROSCOPY WITH D & C N/A 04/08/2017   Procedure: DILATATION AND CURETTAGE /HYSTEROSCOPY;  Surgeon: Edsel Norleen GAILS, MD;  Location: [facility];  Service: Gynecology;  Laterality: N/A;   LEFT HEART CATH AND CORONARY ANGIOGRAPHY N/A 04/27/2019   Procedure: LEFT HEART CATH AND CORONARY ANGIOGRAPHY;  Surgeon: Burnard Debby LABOR, MD;  Location: [facility];  Service: Cardiovascular;   Laterality: N/A;   LOWER EXTREMITY ANGIOGRAPHY Left 10/02/2020   Procedure: LOWER EXTREMITY ANGIOGRAPHY;  Surgeon: Marea Selinda RAMAN, MD;  Location: [facility] INVASIVE CV LAB;  Service: Cardiovascular;  Laterality: Left;   LUMBAR LAMINECTOMY/DECOMPRESSION MICRODISCECTOMY Right 03/15/2022   Procedure: Microdiscectomy - right - Lumbar Four-Lumbar Five extraforaminal;  Surgeon: Louis Shove, MD;  Location: [facility] OR;  Service: Neurosurgery;  Laterality: Right;   NECK SURGERY  1998   fusion   PERCUTANEOUS VENOUS THROMBECTOMY,LYSIS WITH INTRAVASCULAR ULTRASOUND (IVUS) Left 09/13/2017   Procedure: REMOVAL OF LYSIS CATHETER AND INTRAVASCULAR ULTRASOUND (IVUS) LEFT ILIAC VEIN;  Surgeon: Harvey Carlin BRAVO, MD;  Location: [facility] OR;  Service: Vascular;  Laterality: Left;   PERIPHERAL VASCULAR INTERVENTION  09/15/2017   Procedure: PERIPHERAL VASCULAR INTERVENTION;  Surgeon: Sheree Penne Bruckner, MD;  Location: [facility] INVASIVE CV LAB;  Service: Cardiovascular;;  VEINOUS/STENT   PERIPHERAL VASCULAR THROMBECTOMY Left 09/12/2017   Procedure: PERIPHERAL VASCULAR THROMBECTOMY;  Surgeon: Harvey Carlin BRAVO, MD;  Location: [facility];  Service: Cardiovascular;  Laterality: Left;   PERIPHERAL VASCULAR THROMBECTOMY Left 09/15/2017   Procedure: PERIPHERAL VASCULAR THROMBECTOMY;  Surgeon: Sheree Penne Bruckner, MD;  [facility]IVE CV LAB;  Service: Cardiovascular;  Laterality: Left;  IVC TO LT FEM/POP VEIN   SHOULDER ARTHROSCOPY WITH SUBACROMIAL DECOMPRESSION Left 06/23/2013   Procedure: LEFT SHOULDER ARTHROSCOPY WITH DEBRIDEMENT ROTATOR CUFF AND LABRUM;  Surgeon: Lamar LABOR Millman, MD;  Location: [facility];  Service: Orthopedics;  Laterality: Left;   SHOULDER SURGERY  1999   left    Social History   Socioeconomic History   Marital status: Widowed    Spouse name: Not on file   Number of children: 2   Years of education: Not on file   Highest education level: Not on file  Occupational History   Occupation:  works at [employer] store    Employer: [employer]   Tobacco Use   Smoking status: Every Day    Current packs/day: 1.50    Average packs/day: 1.5 packs/day for 53.7 years (80.6 ttl pk-yrs)    Types: Cigarettes    Start date: 04/09/1970   Smokeless tobacco: Never   Tobacco comments:    09/04/20 - states she is down to 9 ciggs / day   Vaping Use   Vaping status: Never Used  Substance and Sexual Activity   Alcohol use: Not Currently   Drug use: No   Sexual activity: Not Currently    Birth control/protection: Post-menopausal  Other Topics Concern   Not on file  Social History Narrative   Not on file   Social Drivers of Health   Financial Resource Strain: Low Risk  (10/01/2023)   Received from [facility]   Overall Financial Resource Strain (CARDIA)    Difficulty of Paying Living Expenses: Not hard at all  Food Insecurity: No Food Insecurity (10/01/2023)   Received from [facility]   Hunger Vital Sign    Within the past 12 months, you worried  that your food would run out before you got the money to buy more.: Never true    Within the past 12 months, the food you bought just didn't last and you didn't have money to get more.: Never true  Transportation Needs: No Transportation Needs (10/01/2023)   Received from [facility] - Transportation    In the past 12 months, has lack of transportation kept you from medical appointments or from getting medications?: No    Lack of Transportation (Non-Medical): No  Physical Activity: Unknown (09/15/2017)   Exercise Vital Sign    Days of Exercise per Week: Patient declined    Minutes of Exercise per Session: Patient declined  Stress: Unknown (09/15/2017)   [employer] - Occupational Stress Questionnaire    Feeling of Stress : Patient declined  Social Connections: Unknown (09/15/2017)   Social Connection and Isolation Panel    Frequency of Communication with Friends and  Family: Patient declined    Frequency of Social Gatherings with Friends and Family: Patient declined    Attends Religious Services: Patient declined    Database Administrator or Organizations: Patient declined    Attends Banker Meetings: Patient declined    Marital Status: Patient declined  Intimate Partner Violence: Unknown (09/15/2017)   Humiliation, Afraid, Rape, and Kick questionnaire    Fear of Current or Ex-Partner: Patient declined    Emotionally Abused: Patient declined    Physically Abused: Patient declined    Sexually Abused: Patient declined    Family History  Problem Relation Age of Onset   Diabetes Father    Hypertension Father    Parkinson's disease Father    Cancer Father        not sure what kind   Hypertension Mother    Heart disease Mother    Drug abuse Sister    Mesothelioma Brother    Cancer Daughter        non-hodgkins lmphoma   Hypertension Brother    Diabetes Brother    Colon cancer Neg Hx    Gastric cancer Neg Hx    Esophageal cancer Neg Hx     Allergies  Allergen Reactions   Penicillins Hives and Rash    Has patient had a PCN reaction causing immediate rash, facial/tongue/throat swelling, SOB or lightheadedness with hypotension: Yes Has patient had a PCN reaction causing severe rash involving mucus membranes or skin necrosis: No Has patient had a PCN reaction that required hospitalization No Has patient had a PCN reaction occurring within the last 10 years: No If all of the above answers are NO, then may proceed with Cephalosporin use.    Doxycycline  Itching     Review of Systems   Review of Systems: Negative Unless Checked Constitutional: [] Weight loss  [] Fever  [] Chills Cardiac: [] Chest pain   []  Atrial Fibrillation  [] Palpitations   [] Shortness of breath when laying flat   [] Shortness of breath with exertion. [] Shortness of breath at rest Vascular:  [] Pain in legs with walking   [] Pain in legs with standing [] Pain in legs  when laying flat   [] Claudication    [x] Pain in feet when laying flat    [] History of DVT   [] Phlebitis   [x] Swelling in legs   [] Varicose veins   [] Non-healing ulcers Pulmonary:   [] Uses home oxygen   [] Productive cough   [] Hemoptysis   [] Wheeze  [] COPD   [] Asthma Neurologic:  [] Dizziness   [] Seizures  [] Blackouts [] History  of stroke   [] History of TIA  [] Aphasia   [] Temporary Blindness   [] Weakness or numbness in arm   [] Weakness or numbness in leg Musculoskeletal:   [] Joint swelling   [] Joint pain   [] Low back pain  []  History of Knee Replacement [] Arthritis [x] back Surgeries  []  Spinal Stenosis    Hematologic:  [] Easy bruising  [] Easy bleeding   [] Hypercoagulable state   [] Anemic Gastrointestinal:  [] Diarrhea   [] Vomiting  [] Gastroesophageal reflux/heartburn   [] Difficulty swallowing. [] Abdominal pain Genitourinary:  [] Chronic kidney disease   [] Difficult urination  [] Anuric   [] Blood in urine [] Frequent urination  [] Burning with urination   [] Hematuria Skin:  [] Rashes   [] Ulcers [] Wounds Psychological:  [] History of anxiety   []  History of major depression  []  Memory Difficulties      OBJECTIVE:     BP (!) 127/50   Pulse (!) 52   Resp 16   Ht 5' 4 (1.626 m)   Wt 205 lb 9.6 oz (93.3 kg)   BMI 35.29 kg/m   Physical Exam Vitals reviewed.  HENT:     Head: Normocephalic.  Cardiovascular:     Rate and Rhythm: Normal rate.  Pulmonary:     Effort: Pulmonary effort is normal.  Musculoskeletal:     Right lower leg: 2+ Edema present.     Left lower leg: 2+ Edema present.  Skin:    General: Skin is warm and dry.  Neurological:     Mental Status: She is alert and oriented to person, place, and time.  Psychiatric:        Mood and Affect: Mood normal.        Behavior: Behavior normal.        Thought Content: Thought content normal.        Judgment: Judgment normal.    Physical Exam     CMP     Component Value Date/Time   NA 132 (L) 09/06/2023 1345   NA 137 11/03/2019 1123    K 4.2 09/06/2023 1345   CL 98 09/06/2023 1345   CO2 23 09/06/2023 1345   GLUCOSE 118 (H) 09/06/2023 1345   BUN 21 09/06/2023 1345   BUN 9 11/03/2019 1123   CREATININE 1.06 (H) 09/06/2023 1345   CALCIUM  9.4 09/06/2023 1345   PROT 7.1 09/06/2023 1345   ALBUMIN 3.9 09/06/2023 1345   AST 18 09/06/2023 1345   ALT 14 09/06/2023 1345   ALKPHOS 45 09/06/2023 1345   BILITOT 1.5 (H) 09/06/2023 1345   GFRNONAA 57 (L) 09/06/2023 1345    No results found.     ASSESSMENT AND PLAN:  1. Lymphedema (Primary) Lower extremity lymphedema Chronic lymphedema with inadequate relief from compression socks. No wounds. Calf size discrepancy noted. - Transitioned to 20-30 mmHg compression socks. - Instructed to measure calf for proper sock sizing. - Advised use of compression pump for one hour in the evening, increase to twice daily if beneficial. - Follow-up in one month to assess progress.  2. Restless leg syndrome Suspected restless leg syndrome Symptoms suggestive of restless leg syndrome with normal arterial and venous evaluations. Possible lumbar radiculopathy contribution. - Referred to neurologist for evaluation and management.  3. Essential hypertension Continue antihypertensive medications as already ordered, these medications have been reviewed and there are no changes at this time.       Current Outpatient Medications on File Prior to Visit  Medication Sig Dispense Refill   alendronate (FOSAMAX) 70 MG tablet Take 70 mg by mouth every Sunday.  atorvastatin  (LIPITOR ) 80 MG tablet Take 1 tablet (80 mg total) by mouth daily. 90 tablet 3   Cholecalciferol  (VITAMIN D ) 50 MCG (2000 UT) CAPS Take 2,000 Units by mouth daily.     ciprofloxacin  (CIPRO ) 250 MG tablet Take 3 tablets (750 mg total) by mouth 2 (two) times daily. 60 tablet 0   clopidogrel  (PLAVIX ) 75 MG tablet Take 1 tablet (75 mg total) by mouth daily. 90 tablet 1   dofetilide  (TIKOSYN ) 250 MCG capsule TAKE 1 CAPSULE BY  MOUTH TWICE A DAY 180 capsule 3   fluticasone  (FLONASE ) 50 MCG/ACT nasal spray Place 2 sprays into both nostrils daily as needed for rhinitis (congestion/ dryness).     JARDIANCE  10 MG TABS tablet Take 10 mg by mouth daily.     ketoconazole  (NIZORAL ) 2 % cream Apply 1 Application topically 2 (two) times daily.     Lancets (ONETOUCH DELICA PLUS LANCET33G) MISC 1 each daily.     losartan  (COZAAR ) 100 MG tablet Take 100 mg by mouth daily with breakfast.      MOUNJARO 10 MG/0.5ML Pen Inject 10 mg into the skin once a week.     nitroGLYCERIN  (NITROSTAT ) 0.4 MG SL tablet Place 1 tablet (0.4 mg total) under the tongue every 5 (five) minutes as needed for chest pain. 30 tablet 0   potassium chloride  SA (KLOR-CON ) 20 MEQ tablet Take 1 tablet (20 mEq total) by mouth 2 (two) times daily.     rivaroxaban  (XARELTO ) 20 MG TABS tablet Take 1 tablet (20 mg total) by mouth daily with supper. 90 tablet 3   Skin Protectants, Misc. (EUCERIN) cream Apply 1 Application topically daily.     spironolactone  (ALDACTONE ) 25 MG tablet Take 25 mg by mouth daily with breakfast.      torsemide  (DEMADEX ) 20 MG tablet Take 60 mg by mouth daily after breakfast.      glipiZIDE  (GLUCOTROL  XL) 5 MG 24 hr tablet Take 5 mg by mouth every morning. (Patient not taking: Reported on 12/30/2023)     HYDROcodone -acetaminophen  (NORCO/VICODIN) 5-325 MG tablet Take 1 tablet by mouth every 6 (six) hours as needed for moderate pain (pain score 4-6). (Patient not taking: Reported on 12/16/2023) 30 tablet 0   No current facility-administered medications on file prior to visit.    There are no Patient Instructions on file for this visit. No follow-ups on file.   Edison Nicholson E Daveah Varone, NP  This note was completed with Office Manager.  Any errors are purely unintentional.

## 2024-01-02 ENCOUNTER — Other Ambulatory Visit (INDEPENDENT_AMBULATORY_CARE_PROVIDER_SITE_OTHER): Payer: Self-pay | Admitting: Vascular Surgery

## 2024-01-07 ENCOUNTER — Inpatient Hospital Stay (HOSPITAL_COMMUNITY): Admission: RE | Admit: 2024-01-07 | Source: Ambulatory Visit

## 2024-01-07 ENCOUNTER — Encounter (HOSPITAL_COMMUNITY): Payer: Self-pay

## 2024-01-22 ENCOUNTER — Telehealth: Payer: Self-pay | Admitting: Cardiology

## 2024-01-22 NOTE — Telephone Encounter (Signed)
°*  STAT* If patient is at the pharmacy, call can be transferred to refill team.   1. Which medications need to be refilled? (please list name of each medication and dose if known)  dofetilide  (TIKOSYN ) 250 MCG capsule   2. Would you like to learn more about the convenience, safety, & potential cost savings by using the [facility]? No    3. Are you open to using the [facility] (Type [facility]. no   4. Which pharmacy/location (including street and city if local pharmacy) is medication to be sent to?  [id number] - [place], [place] - [address] AT [address]     5. Do they need a 30 day or 90 day supply? 90 day

## 2024-01-23 MED ORDER — DOFETILIDE 250 MCG PO CAPS: 250.0000 ug | ORAL_CAPSULE | Freq: Two times a day (BID) | ORAL | 2 refills | Status: AC

## 2024-01-23 NOTE — Telephone Encounter (Signed)
 Refill sent

## 2024-01-29 ENCOUNTER — Ambulatory Visit (INDEPENDENT_AMBULATORY_CARE_PROVIDER_SITE_OTHER): Admitting: Vascular Surgery

## 2024-01-29 ENCOUNTER — Encounter (INDEPENDENT_AMBULATORY_CARE_PROVIDER_SITE_OTHER): Payer: Self-pay | Admitting: Vascular Surgery

## 2024-01-29 VITALS — BP 107/65 | HR 66 | Resp 17 | Ht 64.0 in | Wt 196.4 lb

## 2024-01-29 DIAGNOSIS — I890 Lymphedema, not elsewhere classified: Secondary | ICD-10-CM | POA: Diagnosis not present

## 2024-01-29 DIAGNOSIS — G2581 Restless legs syndrome: Secondary | ICD-10-CM | POA: Diagnosis not present

## 2024-01-29 DIAGNOSIS — I10 Essential (primary) hypertension: Secondary | ICD-10-CM | POA: Diagnosis not present

## 2024-01-29 NOTE — Progress Notes (Unsigned)
 Subjective:    Patient ID: Amy Tran, female    DOB: 1955/11/20, 68 y.o.   MRN: [id number] Chief Complaint  Patient presents with   Follow-up    1 month no studies    HPI  History of Present Illness         Results        Review of Systems     Objective:   Physical Exam  Physical Exam          BP 107/65   Pulse 66   Resp 17   Ht 5' 4 (1.626 m)   Wt 196 lb 6.4 oz (89.1 kg)   BMI 33.71 kg/m   Past Medical History:  Diagnosis Date   Aortic stenosis    Arthritis    Asthma    Cancer (HCC)    multiple skin cancers   Coronary artery disease    Diabetes mellitus without complication (HCC)    Diverticulosis    Dysrhythmia    Fluid retention    GERD (gastroesophageal reflux disease)    High cholesterol    Hyperlipidemia    Hypertension    Incomplete rotator cuff tear    Myocardial infarction (HCC)    Obesity    Osteoporosis    PAD (peripheral artery disease)    Persistent atrial fibrillation (HCC)    PONV (postoperative nausea and vomiting)    Wears glasses    Wears partial dentures    bottom    Social History   Socioeconomic History   Marital status: Widowed    Spouse name: Not on file   Number of children: 2   Years of education: Not on file   Highest education level: Not on file  Occupational History   Occupation: works at [employer] store    Employer: [employer]   Tobacco Use   Smoking status: Every Day    Current packs/day: 1.50    Average packs/day: 1.5 packs/day for 53.8 years (80.7 ttl pk-yrs)    Types: Cigarettes    Start date: 04/09/1970   Smokeless tobacco: Never   Tobacco comments:    09/04/20 - states she is down to 9 ciggs / day   Vaping Use   Vaping status: Never Used  Substance and Sexual Activity   Alcohol use: Not Currently   Drug use: No   Sexual activity: Not Currently    Birth control/protection: Post-menopausal  Other Topics Concern   Not on file  Social History Narrative   Not on  file   Social Drivers of Health   Tobacco Use: High Risk (01/29/2024)   Patient History    Smoking Tobacco Use: Every Day    Smokeless Tobacco Use: Never    Passive Exposure: Not on file  Financial Resource Strain: Low Risk  (10/01/2023)   Received from [facility]   Overall Financial Resource Strain (CARDIA)    Difficulty of Paying Living Expenses: Not hard at all  Food Insecurity: No Food Insecurity (10/01/2023)   Received from [facility]   Epic    Within the past 12 months, you worried that your food would run out before you got the money to buy more.: Never true    Within the past 12 months, the food you bought just didn't last and you didn't have money to get more.: Never true  Transportation Needs: No Transportation Needs (10/01/2023)   Received from [facility]    In  the past 12 months, has lack of transportation kept you from medical appointments or from getting medications?: No    Lack of Transportation (Non-Medical): No  Physical Activity: Not on file  Stress: Not on file  Social Connections: Not on file  Intimate Partner Violence: Not on file  Depression (EYV7-0): Not on file  Alcohol Screen: Not on file  Housing: Unknown (10/01/2023)   Received from [facility]   Epic    In the last 12 months, was there a time when you were not able to pay the mortgage or rent on time?: No    Number of Times Moved in the Last Year: Not on file    At any time in the past 12 months, were you homeless or living in a shelter (including now)?: No  Utilities: Not At Risk (10/01/2023)   Received from [facility]   Epic    In the past 12 months has the electric, gas, oil, or water  company threatened to shut off services in your home?: No  Health Literacy: Not on file    Past Surgical History:  Procedure Laterality Date   APPENDECTOMY     CARDIOVERSION N/A  06/03/2019   Procedure: CARDIOVERSION;  Surgeon: Charls Pearla LABOR, MD;  Location: [facility];  Service: Cardiovascular;  Laterality: N/A;   CARDIOVERSION N/A 07/13/2019   Procedure: CARDIOVERSION;  Surgeon: Barbaraann Darryle Ned, MD;  Location: [facility] ENDOSCOPY;  Service: Cardiovascular;  Laterality: N/A;   CARPAL TUNNEL RELEASE Bilateral 2001   bilateral   CERVICAL POLYPECTOMY  04/08/2017   Procedure: POLYPECTOMY;  Surgeon: Edsel Norleen GAILS, MD;  Location: [facility];  Service: Gynecology;;  Endometrial   CHOLECYSTECTOMY     COLONOSCOPY  2007   MFM:[id number] internal hemorrhoids. Diminutive rectal polyp at 10 cm, cold biopsied/removed. The remainder of the rectal mucosa appeared normal Swallow left-sided diverticula. Diminutive polyp at the splenic flexure cold biopsied/removed (adenomatous)   COLONOSCOPY N/A 12/28/2013   Procedure: COLONOSCOPY;  Surgeon: Lamar CHRISTELLA Hollingshead, MD;  Location: [facility];  Service: Endoscopy;  Laterality: N/A;  730 - moved to 8:30 - Ginger notified pt   CORONARY STENT INTERVENTION N/A 04/27/2019   Procedure: CORONARY STENT INTERVENTION;  Surgeon: Burnard Ned LABOR, MD;  Location: [facility] INVASIVE CV LAB;  Service: Cardiovascular;  Laterality: N/A;   CORONARY ULTRASOUND/IVUS N/A 09/12/2017   Procedure: INTRAVASCULAR ULTRASOUND/IVUS;  Surgeon: Harvey Carlin BRAVO, MD;  Location: [facility] INVASIVE CV LAB;  Service: Cardiovascular;  Laterality: N/A;   DILATION AND CURETTAGE, DIAGNOSTIC / THERAPEUTIC  03/07/2017   ERCP  2002   GASTRIC BYPASS  2004   HYSTEROSCOPY WITH D & C N/A 04/08/2017   Procedure: DILATATION AND CURETTAGE /HYSTEROSCOPY;  Surgeon: Edsel Norleen GAILS, MD;  Location: [facility];  Service: Gynecology;  Laterality: N/A;   LEFT HEART CATH AND CORONARY ANGIOGRAPHY N/A 04/27/2019   Procedure: LEFT HEART CATH AND CORONARY ANGIOGRAPHY;  Surgeon: Burnard Ned LABOR, MD;  Location: [facility] CV LAB;  Service: Cardiovascular;  Laterality: N/A;   LOWER EXTREMITY ANGIOGRAPHY Left 10/02/2020    Procedure: LOWER EXTREMITY ANGIOGRAPHY;  Surgeon: Marea Selinda RAMAN, MD;  Location: [facility] INVASIVE CV LAB;  Service: Cardiovascular;  Laterality: Left;   LUMBAR LAMINECTOMY/DECOMPRESSION MICRODISCECTOMY Right 03/15/2022   Procedure: Microdiscectomy - right - Lumbar Four-Lumbar Five extraforaminal;  Surgeon: Louis Shove, MD;  Location: [facility] OR;  Service: Neurosurgery;  Laterality: Right;   NECK SURGERY  1998   fusion   PERCUTANEOUS VENOUS THROMBECTOMY,LYSIS WITH INTRAVASCULAR ULTRASOUND (IVUS)  Left 09/13/2017   Procedure: REMOVAL OF LYSIS CATHETER AND INTRAVASCULAR ULTRASOUND (IVUS) LEFT ILIAC VEIN;  Surgeon: Harvey Carlin BRAVO, MD;  Location: [facility] OR;  Service: Vascular;  Laterality: Left;   PERIPHERAL VASCULAR INTERVENTION  09/15/2017   Procedure: PERIPHERAL VASCULAR INTERVENTION;  Surgeon: Sheree Penne Bruckner, MD;  Location: [facility] INVASIVE CV LAB;  Service: Cardiovascular;;  VEINOUS/STENT   PERIPHERAL VASCULAR THROMBECTOMY Left 09/12/2017   Procedure: PERIPHERAL VASCULAR THROMBECTOMY;  Surgeon: Harvey Carlin BRAVO, MD;  Location: [facility] CV LAB;  Service: Cardiovascular;  Laterality: Left;   PERIPHERAL VASCULAR THROMBECTOMY Left 09/15/2017   Procedure: PERIPHERAL VASCULAR THROMBECTOMY;  Surgeon: Sheree Penne Bruckner, MD;  Location: [facility] INVASIVE CV LAB;  Service: Cardiovascular;  Laterality: Left;  IVC TO LT FEM/POP VEIN   SHOULDER ARTHROSCOPY WITH SUBACROMIAL DECOMPRESSION Left 06/23/2013   Procedure: LEFT SHOULDER ARTHROSCOPY WITH DEBRIDEMENT ROTATOR CUFF AND LABRUM;  Surgeon: Lamar DELENA Millman, MD;  Location: [facility];  Service: Orthopedics;  Laterality: Left;   SHOULDER SURGERY  1999   left    Family History  Problem Relation Age of Onset   Diabetes Father    Hypertension Father    Parkinson's disease Father    Cancer Father        not sure what kind   Hypertension Mother    Heart disease Mother    Drug abuse Sister    Mesothelioma Brother    Cancer Daughter         non-hodgkins lmphoma   Hypertension Brother    Diabetes Brother    Colon cancer Neg Hx    Gastric cancer Neg Hx    Esophageal cancer Neg Hx     Allergies[1]     Latest Ref Rng & Units 09/06/2023    1:45 PM 10/10/2022   10:03 AM 08/16/2022   12:47 PM  CBC  WBC 4.0 - 10.5 K/uL 20.7  8.3  8.5   Hemoglobin 12.0 - 15.0 g/dL 88.2  86.2  86.5   Hematocrit 36.0 - 46.0 % 35.1  41.6  40.2   Platelets 150 - 400 K/uL 282  266  237       CMP     Component Value Date/Time   NA 132 (L) 09/06/2023 1345   NA 137 11/03/2019 1123   K 4.2 09/06/2023 1345   CL 98 09/06/2023 1345   CO2 23 09/06/2023 1345   GLUCOSE 118 (H) 09/06/2023 1345   BUN 21 09/06/2023 1345   BUN 9 11/03/2019 1123   CREATININE 1.06 (H) 09/06/2023 1345   CALCIUM  9.4 09/06/2023 1345   PROT 7.1 09/06/2023 1345   ALBUMIN 3.9 09/06/2023 1345   AST 18 09/06/2023 1345   ALT 14 09/06/2023 1345   ALKPHOS 45 09/06/2023 1345   BILITOT 1.5 (H) 09/06/2023 1345   GFRNONAA 57 (L) 09/06/2023 1345     No results found.     Assessment & Plan:   1. Lymphedema (Primary) Patient continues to have bilateral lower extremity lymphedema and swelling +3 to +4 to both feet and calves.  She currently is not wearing any compression socks.  She states that she filed with her insurance through one of the local stores which was [employer] and they denied payment for socks.  Therefore she did not buy any.  I recommended that she go out and buy compression socks as soon as she possibly can and warned her that the longer she does not wear them the swelling will continue to increase and possibly  breakdown her skin.  She also reports she is not using her lymphedema pump.  She states that the leggings that pump up do not fit right.  I recommended that she call the company that delivered the lymph pump to her and let them know that there is a problem and they can possibly resize her and refit her for the proper pumps.  I also recommended  and told her she needs to start using the lymphedema pump 3 times a day throughout the day not just prior to going to bed.  Patient will follow-up in January for recheck of her lymphedema as she is also here for arterial duplex ultrasounds with ABIs.  2. Restless leg syndrome Patient was seen by her primary care physician and placed on medication for restless leg syndrome.  She states it is not working so I asked her to follow-up with her primary care regarding possibly another option.   I also asked her to reach out to her neurologist that placed the spinal cord stimulator.  If something is malfunctioning with her stimulator it could be stimulating her legs when she lays down and thus causing this that she describes it as severe restless leg syndrome.  3. Essential hypertension Continue antihypertensive medications as already ordered, these medications have been reviewed and there are no changes at this time.          Medications Ordered Prior to Encounter[2]  There are no Patient Instructions on file for this visit. No follow-ups on file.   Amy JONELLE Shank, NP        [1] Allergies Allergen Reactions   Penicillins Hives and Rash    Has patient had a PCN reaction causing immediate rash, facial/tongue/throat swelling, SOB or lightheadedness with hypotension: Yes Has patient had a PCN reaction causing severe rash involving mucus membranes or skin necrosis: No Has patient had a PCN reaction that required hospitalization No Has patient had a PCN reaction occurring within the last 10 years: No If all of the above answers are NO, then may proceed with Cephalosporin use.    Doxycycline  Itching  [2] Current Outpatient Medications on File Prior to Visit  Medication Sig Dispense Refill   alendronate (FOSAMAX) 70 MG tablet Take 70 mg by mouth every Sunday.     atorvastatin  (LIPITOR ) 80 MG tablet Take 1 tablet (80 mg total) by mouth daily. 90 tablet 3   Cholecalciferol  (VITAMIN D )  50 MCG (2000 UT) CAPS Take 2,000 Units by mouth daily.     ciprofloxacin  (CIPRO ) 250 MG tablet Take 3 tablets (750 mg total) by mouth 2 (two) times daily. 60 tablet 0   clopidogrel  (PLAVIX ) 75 MG tablet Take 1 tablet (75 mg total) by mouth daily. 90 tablet 1   dofetilide  (TIKOSYN ) 250 MCG capsule Take 1 capsule (250 mcg total) by mouth 2 (two) times daily. 180 capsule 2   fluticasone  (FLONASE ) 50 MCG/ACT nasal spray Place 2 sprays into both nostrils daily as needed for rhinitis (congestion/ dryness).     JARDIANCE  10 MG TABS tablet Take 10 mg by mouth daily.     ketoconazole  (NIZORAL ) 2 % cream Apply 1 Application topically 2 (two) times daily.     Lancets (ONETOUCH DELICA PLUS LANCET33G) MISC 1 each daily.     losartan  (COZAAR ) 100 MG tablet Take 100 mg by mouth daily with breakfast.      MOUNJARO 10 MG/0.5ML Pen Inject 10 mg into the skin once a week.     nitroGLYCERIN  (NITROSTAT ) 0.4  MG SL tablet Place 1 tablet (0.4 mg total) under the tongue every 5 (five) minutes as needed for chest pain. 30 tablet 0   potassium chloride  SA (KLOR-CON ) 20 MEQ tablet Take 1 tablet (20 mEq total) by mouth 2 (two) times daily.     rivaroxaban  (XARELTO ) 20 MG TABS tablet Take 1 tablet (20 mg total) by mouth daily with supper. 90 tablet 3   Skin Protectants, Misc. (EUCERIN) cream Apply 1 Application topically daily.     spironolactone  (ALDACTONE ) 25 MG tablet Take 25 mg by mouth daily with breakfast.      torsemide  (DEMADEX ) 20 MG tablet Take 60 mg by mouth daily after breakfast.      glipiZIDE  (GLUCOTROL  XL) 5 MG 24 hr tablet Take 5 mg by mouth every morning. (Patient not taking: Reported on 12/30/2023)     HYDROcodone -acetaminophen  (NORCO/VICODIN) 5-325 MG tablet Take 1 tablet by mouth every 6 (six) hours as needed for moderate pain (pain score 4-6). (Patient not taking: Reported on 12/16/2023) 30 tablet 0   No current facility-administered medications on file prior to visit.

## 2024-02-16 ENCOUNTER — Other Ambulatory Visit (INDEPENDENT_AMBULATORY_CARE_PROVIDER_SITE_OTHER): Payer: Self-pay | Admitting: Nurse Practitioner

## 2024-02-16 ENCOUNTER — Telehealth (INDEPENDENT_AMBULATORY_CARE_PROVIDER_SITE_OTHER): Payer: Self-pay

## 2024-02-16 DIAGNOSIS — M79605 Pain in left leg: Secondary | ICD-10-CM

## 2024-02-16 NOTE — Telephone Encounter (Signed)
 Patient called at this time in reference to L leg pain and discomfort, she stated she had a bad leg cramp Saturday night and she could not walk at the time. At this time she states she has bruising from her knee into her thigh, and the bruising is in spots/splotches and most of her pain is located behind the knee and tender to the touch. Her pain is 4/10 at this time and she has an appointment Wednesday, but was concerned because this is the leg she has been having issues with lately. Please advise.

## 2024-02-16 NOTE — Telephone Encounter (Signed)
 Let's see if we can add a dvt study to her upcoming visit

## 2024-02-18 ENCOUNTER — Other Ambulatory Visit (INDEPENDENT_AMBULATORY_CARE_PROVIDER_SITE_OTHER)

## 2024-02-18 ENCOUNTER — Ambulatory Visit (INDEPENDENT_AMBULATORY_CARE_PROVIDER_SITE_OTHER): Admitting: Nurse Practitioner

## 2024-02-18 ENCOUNTER — Encounter (INDEPENDENT_AMBULATORY_CARE_PROVIDER_SITE_OTHER): Payer: Self-pay | Admitting: Nurse Practitioner

## 2024-02-18 VITALS — BP 115/40 | HR 55 | Ht 64.0 in | Wt 201.8 lb

## 2024-02-18 DIAGNOSIS — Z9889 Other specified postprocedural states: Secondary | ICD-10-CM

## 2024-02-18 DIAGNOSIS — M7120 Synovial cyst of popliteal space [Baker], unspecified knee: Secondary | ICD-10-CM | POA: Diagnosis not present

## 2024-02-18 DIAGNOSIS — I739 Peripheral vascular disease, unspecified: Secondary | ICD-10-CM

## 2024-02-18 DIAGNOSIS — I890 Lymphedema, not elsewhere classified: Secondary | ICD-10-CM

## 2024-02-18 DIAGNOSIS — M79605 Pain in left leg: Secondary | ICD-10-CM | POA: Diagnosis not present

## 2024-02-19 LAB — VAS US ABI WITH/WO TBI
Left ABI: 0.92
Right ABI: 1.27

## 2024-02-22 ENCOUNTER — Encounter (INDEPENDENT_AMBULATORY_CARE_PROVIDER_SITE_OTHER): Payer: Self-pay | Admitting: Nurse Practitioner

## 2024-02-22 ENCOUNTER — Ambulatory Visit (INDEPENDENT_AMBULATORY_CARE_PROVIDER_SITE_OTHER): Payer: Self-pay | Admitting: Nurse Practitioner

## 2024-02-22 NOTE — Progress Notes (Signed)
 I was looking at her studies while doing her note and want her to come in with aorta illiac duplex.  Tell her I saw something on her US  when I was reviewing the studies again, and I want to take a different look due to her symptoms

## 2024-02-22 NOTE — Progress Notes (Signed)
 "  Subjective:    Patient ID: Amy Tran, female    DOB: [birth year], 69 y.o.   MRN: [id number] Chief Complaint  Patient presents with   Follow-up    F/u With abi    HPI  Discussed the use of AI scribe software for clinical note transcription with the patient, who gave verbal consent to proceed.  History of Present Illness Amy Tran is a 69 year old female with peripheral artery disease with prior left leg stents, lymphedema, and restless leg syndrome who presents with acute left leg pain, cramping, bruising, and swelling.  On Saturday night, she experienced sudden onset severe cramp-like pain in the left leg, associated with difficulty ambulating. She stretched her leg, resulting in resolution of the pain. Subsequently, she developed localized bruising and tenderness in the affected area, which she notes has not occurred with prior episodes of cramping. The bruising is now fading. At the time of the event, the leg was notably warm. She previously presented to the emergency department for similar symptoms, at which time a ruptured Baker's cyst was diagnosed and later confirmed resolved on ultrasound. She denies current pain with ambulation but continues to experience nocturnal cramping and jumping sensations in her legs when relaxing in bed.  She has a history of left leg stents for peripheral artery disease. She denies claudication, numbness, or coldness in the foot with ambulation. She describes an internal sensation of coldness in the leg, though it is warm to the touch. She has restless leg syndrome and neuropathy, and has an implanted stimulator for symptom management. She is awaiting neurology follow-up for further evaluation of restless leg syndrome.  She has chronic bilateral lower extremity swelling and a history of lymphedema. She has not yet received compression socks and has been using a pneumatic compression device, though she did not use it during the recent episode  due to uncertainty about her symptoms. The device does not fit well, particularly on the smaller leg, and sometimes causes foot cramping, possibly due to excessive pressure. She regularly elevates her legs while sitting and sleeping. She has not yet contacted her provider for remeasurement of her legs for better fitting compression equipment due to the holidays.    Results Diagnostic Lower extremity venous duplex ultrasound (2024-02-18): No evidence of venous thrombosis Lower extremity arterial duplex ultrasound (2024-02-18): Right leg ankle-brachial index 1.27 (prior 1.20), left leg ankle-brachial index 0.92 (prior 1.30); right toe pressure 0.61 (prior 0.94), left toe pressure 0.42 (prior 0.72); decreased perfusion compared to prior values   Review of Systems  Hematological:  Bruises/bleeds easily.  All other systems reviewed and are negative.      Objective:   Physical Exam Vitals reviewed.  HENT:     Head: Normocephalic.  Cardiovascular:     Rate and Rhythm: Normal rate.  Pulmonary:     Effort: Pulmonary effort is normal.  Musculoskeletal:     Right lower leg: 2+ Edema present.     Left lower leg: 2+ Edema present.  Skin:    General: Skin is warm and dry.  Neurological:     Mental Status: She is alert and oriented to person, place, and time.  Psychiatric:        Mood and Affect: Mood normal.        Behavior: Behavior normal.        Thought Content: Thought content normal.        Judgment: Judgment normal.     Physical Exam EXTREMITIES: Legs swollen.  BP (!) 115/40   Pulse (!) 55   Ht 5' 4 (1.626 m)   Wt 201 lb 12.8 oz (91.5 kg)   BMI 34.64 kg/m   Past Medical History:  Diagnosis Date   Aortic stenosis    Arthritis    Asthma    Cancer (HCC)    multiple skin cancers   Coronary artery disease    Diabetes mellitus without complication (HCC)    Diverticulosis    Dysrhythmia    Fluid retention    GERD (gastroesophageal reflux disease)    High cholesterol     Hyperlipidemia    Hypertension    Incomplete rotator cuff tear    Myocardial infarction (HCC)    Obesity    Osteoporosis    PAD (peripheral artery disease)    Persistent atrial fibrillation (HCC)    PONV (postoperative nausea and vomiting)    Wears glasses    Wears partial dentures    bottom    Social History   Socioeconomic History   Marital status: Widowed    Spouse name: Not on file   Number of children: 2   Years of education: Not on file   Highest education level: Not on file  Occupational History   Occupation: works at [employer] store    Employer: [employer]   Tobacco Use   Smoking status: Every Day    Current packs/day: 1.50    Average packs/day: 1.5 packs/day for 53.9 years (80.8 ttl pk-yrs)    Types: Cigarettes    Start date: 04/09/1970   Smokeless tobacco: Never   Tobacco comments:    09/04/20 - states she is down to 9 ciggs / day   Vaping Use   Vaping status: Never Used  Substance and Sexual Activity   Alcohol use: Not Currently   Drug use: No   Sexual activity: Not Currently    Birth control/protection: Post-menopausal  Other Topics Concern   Not on file  Social History Narrative   Not on file   Social Drivers of Health   Tobacco Use: High Risk (02/22/2024)   Patient History    Smoking Tobacco Use: Every Day    Smokeless Tobacco Use: Never    Passive Exposure: Not on file  Financial Resource Strain: Low Risk  (10/01/2023)   Received from [facility]   Overall Financial Resource Strain (CARDIA)    Difficulty of Paying Living Expenses: Not hard at all  Food Insecurity: No Food Insecurity (10/01/2023)   Received from [facility]   Epic    Within the past 12 months, you worried that your food would run out before you got the money to buy more.: Never true    Within the past 12 months, the food you bought just didn't last and you didn't have money to get more.: Never true  Transportation Needs: No Transportation Needs  (10/01/2023)   Received from [facility] - Transportation    In the past 12 months, has lack of transportation kept you from medical appointments or from getting medications?: No    Lack of Transportation (Non-Medical): No  Physical Activity: Not on file  Stress: Not on file  Social Connections: Not on file  Intimate Partner Violence: Not on file  Depression (EYV7-0): Not on file  Alcohol Screen: Not on file  Housing: Unknown (10/01/2023)   Received from [facility]   Epic    In the last 12 months, was there a  time when you were not able to pay the mortgage or rent on time?: No    Number of Times Moved in the Last Year: Not on file    At any time in the past 12 months, were you homeless or living in a shelter (including now)?: No  Utilities: Not At Risk (10/01/2023)   Received from [facility]   Epic    In the past 12 months has the electric, gas, oil, or water  company threatened to shut off services in your home?: No  Health Literacy: Not on file    Past Surgical History:  Procedure Laterality Date   APPENDECTOMY     CARDIOVERSION N/A 06/03/2019   Procedure: CARDIOVERSION;  Surgeon: Charls Pearla LABOR, MD;  Location: [facility] ORS;  Service: Cardiovascular;  Laterality: N/A;   CARDIOVERSION N/A 07/13/2019   Procedure: CARDIOVERSION;  Surgeon: Barbaraann Darryle Ned, MD;  Location: [facility] ENDOSCOPY;  Service: Cardiovascular;  Laterality: N/A;   CARPAL TUNNEL RELEASE Bilateral 2001   bilateral   CERVICAL POLYPECTOMY  04/08/2017   Procedure: POLYPECTOMY;  Surgeon: Edsel Norleen GAILS, MD;  Location: [facility] ORS;  Service: Gynecology;;  Endometrial   CHOLECYSTECTOMY     COLONOSCOPY  2007   MFM:[id number] internal hemorrhoids. Diminutive rectal polyp at 10 cm, cold biopsied/removed. The remainder of the rectal mucosa appeared normal Swallow left-sided diverticula. Diminutive polyp at the splenic flexure cold biopsied/removed (adenomatous)    COLONOSCOPY N/A 12/28/2013   Procedure: COLONOSCOPY;  Surgeon: Lamar CHRISTELLA Hollingshead, MD;  Location: [facility] ENDO SUITE;  Service: Endoscopy;  Laterality: N/A;  730 - moved to 8:30 - Ginger notified pt   CORONARY STENT INTERVENTION N/A 04/27/2019   Procedure: CORONARY STENT INTERVENTION;  Surgeon: Burnard Ned LABOR, MD;  Location: [facility] INVASIVE CV LAB;  Service: Cardiovascular;  Laterality: N/A;   CORONARY ULTRASOUND/IVUS N/A 09/12/2017   Procedure: INTRAVASCULAR ULTRASOUND/IVUS;  Surgeon: Harvey Carlin BRAVO, MD;  Location: [facility] INVASIVE CV LAB;  Service: Cardiovascular;  Laterality: N/A;   DILATION AND CURETTAGE, DIAGNOSTIC / THERAPEUTIC  03/07/2017   ERCP  2002   GASTRIC BYPASS  2004   HYSTEROSCOPY WITH D & C N/A 04/08/2017   Procedure: DILATATION AND CURETTAGE /HYSTEROSCOPY;  Surgeon: Edsel Norleen GAILS, MD;  Location: [facility];  Service: Gynecology;  Laterality: N/A;   LEFT HEART CATH AND CORONARY ANGIOGRAPHY N/A 04/27/2019   Procedure: LEFT HEART CATH AND CORONARY ANGIOGRAPHY;  Surgeon: Burnard Ned LABOR, MD;  Location: [facility] INVASIVE CV LAB;  Service: Cardiovascular;  Laterality: N/A;   LOWER EXTREMITY ANGIOGRAPHY Left 10/02/2020   Procedure: LOWER EXTREMITY ANGIOGRAPHY;  Surgeon: Marea Selinda RAMAN, MD;  Location: [facility] INVASIVE CV LAB;  Service: Cardiovascular;  Laterality: Left;   LUMBAR LAMINECTOMY/DECOMPRESSION MICRODISCECTOMY Right 03/15/2022   Procedure: Microdiscectomy - right - Lumbar Four-Lumbar Five extraforaminal;  Surgeon: Louis Shove, MD;  Location: [facility] OR;  Service: Neurosurgery;  Laterality: Right;   NECK SURGERY  1998   fusion   PERCUTANEOUS VENOUS THROMBECTOMY,LYSIS WITH INTRAVASCULAR ULTRASOUND (IVUS) Left 09/13/2017   Procedure: REMOVAL OF LYSIS CATHETER AND INTRAVASCULAR ULTRASOUND (IVUS) LEFT ILIAC VEIN;  Surgeon: Harvey Carlin BRAVO, MD;  Location: [facility] OR;  Service: Vascular;  Laterality: Left;   PERIPHERAL VASCULAR INTERVENTION  09/15/2017   Procedure: PERIPHERAL VASCULAR INTERVENTION;  Surgeon: Sheree Penne Bruckner, MD;  Location: [facility] INVASIVE CV LAB;  Service: Cardiovascular;;  VEINOUS/STENT   PERIPHERAL VASCULAR THROMBECTOMY Left 09/12/2017   Procedure: PERIPHERAL VASCULAR THROMBECTOMY;  Surgeon: Harvey Carlin BRAVO, MD;  Location: [facility] INVASIVE CV LAB;  Service: Cardiovascular;  Laterality: Left;   PERIPHERAL VASCULAR THROMBECTOMY Left 09/15/2017   Procedure: PERIPHERAL VASCULAR THROMBECTOMY;  Surgeon: Sheree Penne Bruckner, MD;  Location: [facility] INVASIVE CV LAB;  Service: Cardiovascular;  Laterality: Left;  IVC TO LT FEM/POP VEIN   SHOULDER ARTHROSCOPY WITH SUBACROMIAL DECOMPRESSION Left 06/23/2013   Procedure: LEFT SHOULDER ARTHROSCOPY WITH DEBRIDEMENT ROTATOR CUFF AND LABRUM;  Surgeon: Lamar DELENA Millman, MD;  Location: [facility];  Service: Orthopedics;  Laterality: Left;   SHOULDER SURGERY  1999   left    Family History  Problem Relation Age of Onset   Diabetes Father    Hypertension Father    Parkinson's disease Father    Cancer Father        not sure what kind   Hypertension Mother    Heart disease Mother    Drug abuse Sister    Mesothelioma Brother    Cancer Daughter        non-hodgkins lmphoma   Hypertension Brother    Diabetes Brother    Colon cancer Neg Hx    Gastric cancer Neg Hx    Esophageal cancer Neg Hx     Allergies[1]     Latest Ref Rng & Units 09/06/2023    1:45 PM 10/10/2022   10:03 AM 08/16/2022   12:47 PM  CBC  WBC 4.0 - 10.5 K/uL 20.7  8.3  8.5   Hemoglobin 12.0 - 15.0 g/dL 88.2  86.2  86.5   Hematocrit 36.0 - 46.0 % 35.1  41.6  40.2   Platelets 150 - 400 K/uL 282  266  237       CMP     Component Value Date/Time   NA 132 (L) 09/06/2023 1345   NA 137 11/03/2019 1123   K 4.2 09/06/2023 1345   CL 98 09/06/2023 1345   CO2 23 09/06/2023 1345   GLUCOSE 118 (H) 09/06/2023 1345   BUN 21 09/06/2023 1345   BUN 9 11/03/2019 1123   CREATININE 1.06 (H) 09/06/2023 1345   CALCIUM  9.4 09/06/2023 1345   PROT 7.1 09/06/2023 1345   ALBUMIN 3.9  09/06/2023 1345   AST 18 09/06/2023 1345   ALT 14 09/06/2023 1345   ALKPHOS 45 09/06/2023 1345   BILITOT 1.5 (H) 09/06/2023 1345   GFRNONAA 57 (L) 09/06/2023 1345     VAS US  ABI WITH/WO TBI Result Date: 02/19/2024  LOWER EXTREMITY DOPPLER STUDY Patient Name:  Amy Tran  Date of Exam:   02/18/2024 Medical Rec #: [id number]          Accession #:    [id number] Date of Birth: 13-Jun-1955          Patient Gender: F Patient Age:   79 years Exam Location:  [facility] Procedure:      VAS US  ABI WITH/WO TBI Referring Phys: ORVIN DARING --------------------------------------------------------------------------------  Indications: Rest pain, peripheral artery disease, and severe edema. High Risk Factors: Hypertension, Diabetes, current smoker, prior MI. Other Factors: Patient complains of right leg numbness since back surgery and                left leg edema.  Vascular Interventions: 10/02/2020 lt pop stent. Performing Technologist: Donnice Charnley RVT  Examination Guidelines: A complete evaluation includes at minimum, Doppler waveform signals and systolic blood pressure reading at the level of bilateral brachial, anterior tibial, and posterior tibial arteries, when vessel segments are accessible. Bilateral testing is considered an integral part of a complete examination. Photoelectric Plethysmograph (  PPG) waveforms and toe systolic pressure readings are included as required and additional duplex testing as needed. Limited examinations for reoccurring indications may be performed as noted.  ABI Findings: +---------+------------------+-----+---------+--------+ Right    Rt Pressure (mmHg)IndexWaveform Comment  +---------+------------------+-----+---------+--------+ Brachial 145                                      +---------+------------------+-----+---------+--------+ PTA      102               0.70 triphasic         +---------+------------------+-----+---------+--------+ DP        184               1.27 triphasic         +---------+------------------+-----+---------+--------+ Great Toe89                0.61                   +---------+------------------+-----+---------+--------+ +---------+------------------+-----+----------+-------+ Left     Lt Pressure (mmHg)IndexWaveform  Comment +---------+------------------+-----+----------+-------+ Brachial 138                                      +---------+------------------+-----+----------+-------+ PTA      127               0.88 monophasic        +---------+------------------+-----+----------+-------+ DP       133               0.92 monophasic        +---------+------------------+-----+----------+-------+ Great Toe61                0.42                   +---------+------------------+-----+----------+-------+ +-------+-----------+-----------+------------+------------+ ABI/TBIToday's ABIToday's TBIPrevious ABIPrevious TBI +-------+-----------+-----------+------------+------------+ Right  1.27       0.61       1.20        0.94         +-------+-----------+-----------+------------+------------+ Left   0.92       0.42       1.30        0.72         +-------+-----------+-----------+------------+------------+  Patient refused upper arm cuff placement. Pressures are forearm radial artery pressures. Right ABIs appear essentially unchanged compared to prior study on 02/21/2023. Left ABIs appear decreased compared to prior study on 02/21/2023.  Summary: Right: Resting right ankle-brachial index is within normal range. The right toe-brachial index is abnormal.  Left: Resting left ankle-brachial index indicates mild left lower extremity arterial disease. The left toe-brachial index is abnormal.  Limited imaging suggests iliac artery stenosis. *See table(s) above for measurements and observations.  Electronically signed by Selinda Gu MD on 02/19/2024 at 4:00:55 PM.    Final        Assessment & Plan:    1. Peripheral arterial disease with history of revascularization (Primary) Peripheral artery disease of lower extremities with prior stenting Peripheral artery disease with prior left leg stenting. Recent studies show decreased ABI and toe pressures, especially in the left leg, without claudication or acute limb ischemia. Sensory symptoms suggest neuropathy. No acute stent occlusion. - Reviewed recent arterial studies and ABI values. - Assessed for symptoms of acute limb ischemia; no acute stent occlusion. - Scheduled  follow-up for March 18 to reassess vascular status.  2. Lymphedema Lymphedema of lower extremities Chronic bilateral lower extremity lymphedema with persistent swelling. Management complicated by compression garment and pneumatic pump fit issues. - Reinforced importance of compression, elevation, and activity. - Advised continued use of recliner and leg elevation. - Recommended contacting provider for remeasurement of legs for compression socks. - Advised contacting provider for pump adjustment and remeasurement. - Scheduled follow-up for March 18 to monitor lymphedema.  3. Baker's cyst of knee, unspecified laterality Ruptured Baker's cyst, resolved Resolved left leg Baker's cyst rupture with prior acute symptoms. No current cyst on imaging. Residual soreness and bruising consistent with prior rupture sequelae. - Reviewed history and resolution of ruptured Baker's cyst. - Confirmed absence of Baker's cyst on recent ultrasound.   Assessment and Plan Assessment & Plan        Medications Ordered Prior to Encounter[2]  There are no Patient Instructions on file for this visit. No follow-ups on file.   Charlena Haub E Jeremiah Curci, NP      [1]  Allergies Allergen Reactions   Penicillins Hives and Rash    Has patient had a PCN reaction causing immediate rash, facial/tongue/throat swelling, SOB or lightheadedness with hypotension: Yes Has patient had a PCN reaction causing  severe rash involving mucus membranes or skin necrosis: No Has patient had a PCN reaction that required hospitalization No Has patient had a PCN reaction occurring within the last 10 years: No If all of the above answers are NO, then may proceed with Cephalosporin use.    Doxycycline  Itching  [2]  Current Outpatient Medications on File Prior to Visit  Medication Sig Dispense Refill   alendronate (FOSAMAX) 70 MG tablet Take 70 mg by mouth every Sunday.     atorvastatin  (LIPITOR ) 80 MG tablet Take 1 tablet (80 mg total) by mouth daily. 90 tablet 3   Cholecalciferol  (VITAMIN D ) 50 MCG (2000 UT) CAPS Take 2,000 Units by mouth daily.     ciprofloxacin  (CIPRO ) 250 MG tablet Take 3 tablets (750 mg total) by mouth 2 (two) times daily. 60 tablet 0   clopidogrel  (PLAVIX ) 75 MG tablet Take 1 tablet (75 mg total) by mouth daily. 90 tablet 1   dofetilide  (TIKOSYN ) 250 MCG capsule Take 1 capsule (250 mcg total) by mouth 2 (two) times daily. 180 capsule 2   fluticasone  (FLONASE ) 50 MCG/ACT nasal spray Place 2 sprays into both nostrils daily as needed for rhinitis (congestion/ dryness).     glipiZIDE  (GLUCOTROL  XL) 5 MG 24 hr tablet Take 5 mg by mouth every morning.     HYDROcodone -acetaminophen  (NORCO/VICODIN) 5-325 MG tablet Take 1 tablet by mouth every 6 (six) hours as needed for moderate pain (pain score 4-6). 30 tablet 0   JARDIANCE  10 MG TABS tablet Take 10 mg by mouth daily.     ketoconazole  (NIZORAL ) 2 % cream Apply 1 Application topically 2 (two) times daily.     Lancets (ONETOUCH DELICA PLUS LANCET33G) MISC 1 each daily.     losartan  (COZAAR ) 100 MG tablet Take 100 mg by mouth daily with breakfast.      MOUNJARO 10 MG/0.5ML Pen Inject 10 mg into the skin once a week.     nitroGLYCERIN  (NITROSTAT ) 0.4 MG SL tablet Place 1 tablet (0.4 mg total) under the tongue every 5 (five) minutes as needed for chest pain. 30 tablet 0   potassium chloride  SA (KLOR-CON ) 20 MEQ tablet Take 1 tablet (20 mEq total)  by mouth 2 (two) times  daily.     rivaroxaban  (XARELTO ) 20 MG TABS tablet Take 1 tablet (20 mg total) by mouth daily with supper. 90 tablet 3   Skin Protectants, Misc. (EUCERIN) cream Apply 1 Application topically daily.     spironolactone  (ALDACTONE ) 25 MG tablet Take 25 mg by mouth daily with breakfast.      torsemide  (DEMADEX ) 20 MG tablet Take 60 mg by mouth daily after breakfast.      No current facility-administered medications on file prior to visit.   "

## 2024-02-23 ENCOUNTER — Other Ambulatory Visit (INDEPENDENT_AMBULATORY_CARE_PROVIDER_SITE_OTHER): Payer: Self-pay | Admitting: Nurse Practitioner

## 2024-02-23 ENCOUNTER — Other Ambulatory Visit: Payer: Self-pay | Admitting: Student

## 2024-02-23 DIAGNOSIS — I739 Peripheral vascular disease, unspecified: Secondary | ICD-10-CM

## 2024-02-25 ENCOUNTER — Other Ambulatory Visit (INDEPENDENT_AMBULATORY_CARE_PROVIDER_SITE_OTHER)

## 2024-02-25 DIAGNOSIS — I739 Peripheral vascular disease, unspecified: Secondary | ICD-10-CM

## 2024-02-29 ENCOUNTER — Ambulatory Visit (HOSPITAL_COMMUNITY)
Admission: RE | Admit: 2024-02-29 | Discharge: 2024-02-29 | Disposition: A | Discharge status: Home / Self Care | Source: Ambulatory Visit | Attending: Acute Care | Admitting: Acute Care

## 2024-02-29 DIAGNOSIS — R911 Solitary pulmonary nodule: Secondary | ICD-10-CM | POA: Insufficient documentation

## 2024-02-29 DIAGNOSIS — Z87891 Personal history of nicotine dependence: Secondary | ICD-10-CM | POA: Diagnosis present

## 2024-03-09 ENCOUNTER — Other Ambulatory Visit: Payer: Self-pay

## 2024-03-09 ENCOUNTER — Telehealth: Payer: Self-pay | Admitting: *Deleted

## 2024-03-09 DIAGNOSIS — R911 Solitary pulmonary nodule: Secondary | ICD-10-CM

## 2024-03-09 NOTE — Telephone Encounter (Signed)
 Call report from [facility]:  IMPRESSION: 1. Lung-RADS 3, probably benign findings. Short-term follow-up in 6 months is recommended with repeat low-dose chest CT without contrast (please use the following order, CT CHEST LCS NODULE FOLLOW-UP W/O CM). New indistinct solid 5.5 mm peripheral right upper lobe nodule. Previously described 6 mm solid medial right lower lobe nodule is stable. 2. Dilated main pulmonary artery, suggesting pulmonary arterial hypertension. 3. Three-vessel coronary atherosclerosis. 4. Anemia. 5. Stable left adrenal adenoma, for which no follow-up imaging is recommended. 6. Aortic Atherosclerosis (ICD10-I70.0) and Emphysema (ICD10-J43.9).

## 2024-03-09 NOTE — Telephone Encounter (Signed)
 Spoke with patient by phone, using two patient identifiers.  Reviewed results of recent LDCT.  New lung nodule with recommendation for another 6 months follow up CT.  Patient states she can't recall any recent symptoms of illness but is agreeable to a follow up LDCT.  Order placed and results/plan faxed to PCP.

## 2024-03-09 NOTE — Telephone Encounter (Signed)
 Left Vm to call for results review LDCT

## 2024-04-28 ENCOUNTER — Ambulatory Visit (INDEPENDENT_AMBULATORY_CARE_PROVIDER_SITE_OTHER): Admitting: Nurse Practitioner

## 2024-04-28 ENCOUNTER — Ambulatory Visit (INDEPENDENT_AMBULATORY_CARE_PROVIDER_SITE_OTHER): Admitting: Vascular Surgery
